# Patient Record
Sex: Male | Born: 1950 | Race: White | Hispanic: No | Marital: Married | Smoking: Former smoker
Health system: Southern US, Community
[De-identification: ages and names within clinical notes are randomized; demographics above are authoritative.]

## PROBLEM LIST (undated history)

## (undated) DIAGNOSIS — R918 Other nonspecific abnormal finding of lung field: Secondary | ICD-10-CM

## (undated) DIAGNOSIS — I10 Essential (primary) hypertension: Secondary | ICD-10-CM

## (undated) DIAGNOSIS — C3491 Malignant neoplasm of unspecified part of right bronchus or lung: Secondary | ICD-10-CM

## (undated) DIAGNOSIS — I255 Ischemic cardiomyopathy: Secondary | ICD-10-CM

## (undated) DIAGNOSIS — I2510 Atherosclerotic heart disease of native coronary artery without angina pectoris: Secondary | ICD-10-CM

## (undated) DIAGNOSIS — K219 Gastro-esophageal reflux disease without esophagitis: Secondary | ICD-10-CM

## (undated) DIAGNOSIS — Z951 Presence of aortocoronary bypass graft: Secondary | ICD-10-CM

## (undated) DIAGNOSIS — Z87891 Personal history of nicotine dependence: Secondary | ICD-10-CM

## (undated) DIAGNOSIS — E785 Hyperlipidemia, unspecified: Secondary | ICD-10-CM

## (undated) HISTORY — DX: Hyperlipidemia, unspecified: E78.5

## (undated) HISTORY — DX: Personal history of nicotine dependence: Z87.891

## (undated) HISTORY — DX: Ischemic cardiomyopathy: I25.5

## (undated) HISTORY — DX: Malignant neoplasm of unspecified part of right bronchus or lung: C34.91

## (undated) HISTORY — DX: Presence of aortocoronary bypass graft: Z95.1

## (undated) HISTORY — DX: Atherosclerotic heart disease of native coronary artery without angina pectoris: I25.10

---
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
## 2011-02-28 ENCOUNTER — Other Ambulatory Visit: Payer: Self-pay | Admitting: Internal Medicine

## 2011-02-28 DIAGNOSIS — F17200 Nicotine dependence, unspecified, uncomplicated: Secondary | ICD-10-CM

## 2011-03-06 ENCOUNTER — Ambulatory Visit
Admission: RE | Admit: 2011-03-06 | Discharge: 2011-03-06 | Disposition: A | Discharge status: Home / Self Care | Payer: No Typology Code available for payment source | Source: Ambulatory Visit | Attending: Internal Medicine | Admitting: Internal Medicine

## 2011-03-06 DIAGNOSIS — F17200 Nicotine dependence, unspecified, uncomplicated: Secondary | ICD-10-CM

## 2011-03-14 ENCOUNTER — Other Ambulatory Visit: Payer: Self-pay | Admitting: Internal Medicine

## 2011-03-14 DIAGNOSIS — R911 Solitary pulmonary nodule: Secondary | ICD-10-CM

## 2011-07-10 ENCOUNTER — Ambulatory Visit
Admission: RE | Admit: 2011-07-10 | Discharge: 2011-07-10 | Disposition: A | Discharge status: Home / Self Care | Payer: BC Managed Care – PPO | Source: Ambulatory Visit | Attending: Internal Medicine | Admitting: Internal Medicine

## 2011-07-10 DIAGNOSIS — R911 Solitary pulmonary nodule: Secondary | ICD-10-CM

## 2012-04-03 ENCOUNTER — Other Ambulatory Visit: Payer: Self-pay | Admitting: Internal Medicine

## 2012-04-07 ENCOUNTER — Ambulatory Visit
Admission: RE | Admit: 2012-04-07 | Discharge: 2012-04-07 | Disposition: A | Discharge status: Home / Self Care | Payer: BC Managed Care – PPO | Source: Ambulatory Visit | Attending: Internal Medicine | Admitting: Internal Medicine

## 2012-04-07 DIAGNOSIS — H0520 Unspecified exophthalmos: Secondary | ICD-10-CM

## 2012-04-07 MED ORDER — GADOBENATE DIMEGLUMINE 529 MG/ML IV SOLN
14.0000 mL | Freq: Once | INTRAVENOUS | Status: AC | PRN
  Administered 2012-04-07: 14 mL via INTRAVENOUS

## 2013-04-08 ENCOUNTER — Other Ambulatory Visit: Payer: Self-pay | Admitting: Internal Medicine

## 2013-04-08 DIAGNOSIS — E041 Nontoxic single thyroid nodule: Secondary | ICD-10-CM

## 2013-04-08 DIAGNOSIS — R911 Solitary pulmonary nodule: Secondary | ICD-10-CM

## 2013-04-15 ENCOUNTER — Ambulatory Visit
Admission: RE | Admit: 2013-04-15 | Discharge: 2013-04-15 | Disposition: A | Discharge status: Home / Self Care | Payer: BC Managed Care – PPO | Source: Ambulatory Visit | Attending: Internal Medicine | Admitting: Internal Medicine

## 2013-04-15 DIAGNOSIS — R911 Solitary pulmonary nodule: Secondary | ICD-10-CM

## 2013-04-15 DIAGNOSIS — E041 Nontoxic single thyroid nodule: Secondary | ICD-10-CM

## 2013-04-15 MED ORDER — IOHEXOL 300 MG/ML  SOLN
75.0000 mL | Freq: Once | INTRAMUSCULAR | Status: AC | PRN
  Administered 2013-04-15: 75 mL via INTRAVENOUS

## 2014-05-26 ENCOUNTER — Other Ambulatory Visit (HOSPITAL_COMMUNITY): Payer: Self-pay | Admitting: Internal Medicine

## 2014-05-26 DIAGNOSIS — R079 Chest pain, unspecified: Secondary | ICD-10-CM

## 2014-05-27 ENCOUNTER — Telehealth (HOSPITAL_COMMUNITY): Payer: Self-pay

## 2014-05-27 NOTE — Telephone Encounter (Signed)
Encounter complete. 

## 2014-05-28 ENCOUNTER — Ambulatory Visit (HOSPITAL_COMMUNITY)
Admission: RE | Admit: 2014-05-28 | Discharge: 2014-05-28 | Disposition: A | Discharge status: Home / Self Care | Payer: BLUE CROSS/BLUE SHIELD | Source: Ambulatory Visit | Attending: Cardiology | Admitting: Cardiology

## 2014-05-28 DIAGNOSIS — R0600 Dyspnea, unspecified: Secondary | ICD-10-CM | POA: Insufficient documentation

## 2014-05-28 DIAGNOSIS — R079 Chest pain, unspecified: Secondary | ICD-10-CM | POA: Insufficient documentation

## 2014-05-28 DIAGNOSIS — F17210 Nicotine dependence, cigarettes, uncomplicated: Secondary | ICD-10-CM | POA: Diagnosis not present

## 2014-05-28 DIAGNOSIS — R42 Dizziness and giddiness: Secondary | ICD-10-CM | POA: Diagnosis not present

## 2014-05-28 DIAGNOSIS — E785 Hyperlipidemia, unspecified: Secondary | ICD-10-CM | POA: Diagnosis not present

## 2014-05-28 HISTORY — PX: NM MYOVIEW LTD: HXRAD82

## 2014-05-28 MED ORDER — TECHNETIUM TC 99M SESTAMIBI GENERIC - CARDIOLITE
32.5000 | Freq: Once | INTRAVENOUS | Status: AC | PRN
  Administered 2014-05-28: 33 via INTRAVENOUS

## 2014-05-28 MED ORDER — REGADENOSON 0.4 MG/5ML IV SOLN
0.4000 mg | Freq: Once | INTRAVENOUS | Status: AC
  Administered 2014-05-28: 0.4 mg via INTRAVENOUS

## 2014-05-28 MED ORDER — TECHNETIUM TC 99M SESTAMIBI GENERIC - CARDIOLITE
10.6000 | Freq: Once | INTRAVENOUS | Status: AC | PRN
  Administered 2014-05-28: 11 via INTRAVENOUS

## 2014-05-28 NOTE — Procedures (Addendum)
[facility] [address] [place] [address] [phone]  Cardiology Nuclear Med Study  Kelly Olson. is a 64 y.o. male     MRN : [id number]     DOB: Jun 10, 1950  Procedure Date: 05/28/2014  Nuclear Med Background Indication for Stress Test:  Evaluation for Ischemia History:  No prior respiratory history reported;No prior cardiac history reported:No prior NUC MPI fo rcomparison. Cardiac Risk Factors: Lipids and Smoker  Symptoms:  Chest Pain, DOE, Light-Headedness and CP radiating to forearms and fingers.   Nuclear Pre-Procedure Caffeine/Decaff Intake:  1:00am NPO After: 9:00am   IV Site: R Forearm  IV 0.9% NS with Angio Cath:  22g  Chest Size (in):  36" IV Started by: Larene Beach, RN  Height: '5\' 11"'$  (1.803 m)  Cup Size: n/a  BMI:  Body mass index is 22.46 kg/(m^2). Weight:  161 lb (73.029 kg)   Tech Comments:  n/a    Nuclear Med Study 1 or 2 day study: 1 day  Stress Test Type:  [place]  Order Authorizing Provider:  [name], MD   Resting Radionuclide: Technetium 54mSestamibi  Resting Radionuclide Dose: 10.6 mCi   Stress Radionuclide:  Technetium 979mestamibi  Stress Radionuclide Dose: 32.5 mCi           Stress Protocol Rest HR: 74 Stress HR: 100  Rest BP: 158/98 Stress BP: 160/95  Exercise Time (min): n/a METS: n/a          Dose of Adenosine (mg):  n/a Dose of Lexiscan: 0.4 mg  Dose of Atropine (mg): n/a Dose of Dobutamine: n/a mcg/kg/min (at max HR)  Stress Test Technologist: GwMellody MemosCCT Nuclear Technologist:Pamela Phillips,CNMT   Rest Procedure:  Myocardial perfusion imaging was performed at rest 45 minutes following the intravenous administration of Technetium 9970mstamibi. Stress Procedure:  The patient received IV Lexiscan 0.4 mg over 15-seconds.  Technetium 71m24mtamibi injected IV at 30-seconds.  There were no significant changes with Lexiscan.  Quantitative spect images were  obtained after a 45 minute delay.  Transient Ischemic Dilatation (Normal <1.22):  1.54  QGS EDV:  191 ml QGS ESV:  127 ml LV Ejection Fraction: 33%  PHYSICIAN INTERPRETATION  Rest ECG: NSR with non-specific ST-T wave changes  Stress ECG: No significant change from baseline ECG and No significant ST segment change suggestive of ischemia.  QPS Raw Data Images:  Mild diaphragmatic attenuation; normal left ventricular size.  Increased tracer uptake in the splanchnic viscerae.Partially obscures the inferior wall on rest images (despite multiple attempts).  Increased RV free wall tracer uptake suggests elevated pulmonary pressures.   Stress Images:  There is decreased uptake in the anterior wall.  There is decreased uptake in the inferior wall.  There is decreased uptake in the apex.  There is partial reversibility in this area.  There is a large sized, moderate to severe intensity - partially reversible perfusion defect in the mid to apical anterior wall.  There is also a large size, moderate intensity also partially reversible perfusion defect in the entire inferior wall.  The apex has a fixed medium sized, severe intensity perfusion defect with no reversibility. Rest Images:  There is decreased uptake in the anterior wall. There is decreased uptake in the inferior wall. There is decreased uptake in the apex. Reversibility as noted. Subtraction (SDS):  There is a fixed defect that is most consistent with a previous infarction.  The defects are mostly fixed with partial  reversibility consistent with ischemia.  These is also apical akinesis and severe hypokinesis to akinesis of the mid to apical anterior and apical inferior wall with severely reduced LV function. Consistent with ischemic Cardiomyopathy.  Impression Exercise Capacity:  Lexiscan with no exercise. BP Response:  Normal blood pressure response. Clinical Symptoms:  Atypical chest pain. Hand tingling (which is one of his presenting  symptoms) ECG Impression:  No significant ECG changes with Lexiscan. Comparison with Prior Nuclear Study: No images to compare  Overall Impression:  High risk stress nuclear study Severely ischemic Cardiomyoapthy with evidence of at least 2 vessel disease & EF of ~33%.  Images are consistent with either infarction or severe resting ischemia in the LAD & RCA territory.  . Recommend Cardiac Catheterization.  LV Wall Motion:  Severely reduced LV function - EF 33% with anterior, apical and inferoapical wall motion abnormalitlies noted above.   ADDENDUM: This report was reviewed off-line upon completion & prior to the patient leaving. I personally discussed the concerning results with him.  He will be contacted tomorrow with a initial Cardiology Clinic appointment time to discuss scheduling Cardiac Catheterization.  Leonie Man, MD  05/28/2014 6:00 PM

## 2014-05-31 DIAGNOSIS — I255 Ischemic cardiomyopathy: Secondary | ICD-10-CM

## 2014-05-31 HISTORY — DX: Ischemic cardiomyopathy: I25.5

## 2014-06-01 ENCOUNTER — Encounter (HOSPITAL_COMMUNITY): Payer: Self-pay

## 2014-06-01 ENCOUNTER — Encounter: Payer: Self-pay | Admitting: *Deleted

## 2014-06-01 ENCOUNTER — Ambulatory Visit (INDEPENDENT_AMBULATORY_CARE_PROVIDER_SITE_OTHER): Payer: BLUE CROSS/BLUE SHIELD | Admitting: Physician Assistant

## 2014-06-01 ENCOUNTER — Encounter: Payer: Self-pay | Admitting: Physician Assistant

## 2014-06-01 ENCOUNTER — Encounter: Payer: Self-pay | Admitting: Cardiology

## 2014-06-01 VITALS — BP 142/84 | HR 94 | Ht 71.5 in | Wt 159.6 lb

## 2014-06-01 DIAGNOSIS — R0789 Other chest pain: Secondary | ICD-10-CM | POA: Diagnosis not present

## 2014-06-01 DIAGNOSIS — E785 Hyperlipidemia, unspecified: Secondary | ICD-10-CM | POA: Insufficient documentation

## 2014-06-01 DIAGNOSIS — R5383 Other fatigue: Secondary | ICD-10-CM

## 2014-06-01 DIAGNOSIS — Z01812 Encounter for preprocedural laboratory examination: Secondary | ICD-10-CM | POA: Diagnosis not present

## 2014-06-01 DIAGNOSIS — Z79899 Other long term (current) drug therapy: Secondary | ICD-10-CM

## 2014-06-01 DIAGNOSIS — Z720 Tobacco use: Secondary | ICD-10-CM

## 2014-06-01 DIAGNOSIS — R931 Abnormal findings on diagnostic imaging of heart and coronary circulation: Secondary | ICD-10-CM | POA: Diagnosis not present

## 2014-06-01 DIAGNOSIS — Z87891 Personal history of nicotine dependence: Secondary | ICD-10-CM | POA: Insufficient documentation

## 2014-06-01 DIAGNOSIS — I208 Other forms of angina pectoris: Secondary | ICD-10-CM

## 2014-06-01 DIAGNOSIS — Z8679 Personal history of other diseases of the circulatory system: Secondary | ICD-10-CM | POA: Insufficient documentation

## 2014-06-01 DIAGNOSIS — I209 Angina pectoris, unspecified: Secondary | ICD-10-CM | POA: Insufficient documentation

## 2014-06-01 MED ORDER — CARVEDILOL 3.125 MG PO TABS: 3.1250 mg | ORAL_TABLET | Freq: Two times a day (BID) | ORAL | Status: DC

## 2014-06-01 NOTE — Progress Notes (Signed)
Patient ID: Kelly Spiller., male   DOB: 28-Apr-1950, 64 y.o.   MRN: [id number]    Date:  06/01/2014   ID:  Kelly Harpin., DOB 05/20/1950, MRN [id number]  PCP:  Irven Shelling, MD  Primary Cardiologist: [facility]  Chief Complaint  Patient presents with  . Follow-up    Dr. Ellyn Hack discuss Nuclear Study with patient Thursday after his test and requested he be seen today to set up for a cath. Stress test due to aching in forarms, tingling of fingers and tightness in center of chest with exercise.  Symptoms resolve quickly when stopping exercise.     History of Present Illness: Kelly Garry. is a 64 y.o. male with a history of tobacco abuse up until Friday last week.  ~46PY, HDL. He has recent been walking 7.5 miles a day because his wife got him a Fitbit.  When his HR is 118-120 he develops mild chest tightness and forearm pain.  It resolves with cessation of activity.  He had a colonoscopy within the last 1-2 years which was ok.   He under went a lexiscan stress trest last week which was high risk. Severely ischemic Cardiomyoapthy with evidence of at least 2 vessel disease & EF of ~33%. Images are consistent with either infarction or severe resting ischemia in the LAD & RCA territory.   The patient currently denies nausea, vomiting, fever, shortness of breath, orthopnea, dizziness, PND, cough, congestion, abdominal pain, hematochezia, melena, lower extremity edema, claudication.  Wt Readings from Last 3 Encounters:  06/01/14 159 lb 9.6 oz (72.394 kg)  05/28/14 161 lb (73.029 kg)     History reviewed. No pertinent past medical history.  Current Outpatient Prescriptions  Medication Sig Dispense Refill  . aspirin 81 MG tablet Take 81 mg by mouth daily.    Marland Kitchen atorvastatin (LIPITOR) 10 MG tablet Take 1 tablet by mouth daily.    . carvedilol (COREG) 3.125 MG tablet Take 1 tablet (3.125 mg total) by mouth 2 (two) times daily. 60 tablet 5   No current facility-administered  medications for this visit.    Allergies:   No Known Allergies  Social History:  The patient  reports that he quit smoking 3 days ago. He does not have any smokeless tobacco history on file. He reports that he drinks about 4.2 - 8.4 oz of alcohol per week. He reports that he does not use illicit drugs.   Family history:  History reviewed. No pertinent family history.  ROS:  Please see the history of present illness.  All other systems reviewed and negative.   PHYSICAL EXAM: VS:  BP 142/84 mmHg  Pulse 94  Ht 5' 11.5" (1.816 m)  Wt 159 lb 9.6 oz (72.394 kg)  BMI 21.95 kg/m2 Well nourished, well developed, in no acute distress HEENT: Pupils are equal round react to light accommodation extraocular movements are intact.  Neck: no JVDNo cervical lymphadenopathy. Cardiac: Regular rate and rhythm without murmurs rubs or gallops. Lungs:  clear to auscultation bilaterally, no wheezing, rhonchi or rales Abd: soft, nontender, positive bowel sounds all quadrants, no hepatosplenomegaly Ext: no lower extremity edema.  2+ radial and dorsalis pedis pulses. Skin: warm and dry Neuro:  Grossly normal      ASSESSMENT AND PLAN:  Problem List Items Addressed This Visit    Tobacco abuse    Quit last Friday      Stable angina    SP high risk nuke.  Left heart cath with Dr. Ellyn Hack  this week.  Precath labs and CXR,  Start Coreg 3.125 bid.  On ASA and statin.   Risks discussed.  No exercise for now.       Relevant Medications   atorvastatin (LIPITOR) 10 MG tablet   aspirin 81 MG tablet   carvedilol (COREG) 3.125 MG tablet   HLD (hyperlipidemia)    On a statin      Relevant Medications   atorvastatin (LIPITOR) 10 MG tablet   aspirin 81 MG tablet   carvedilol (COREG) 3.125 MG tablet    Other Visit Diagnoses    Abnormal nuclear cardiac imaging test    -  Primary    Relevant Orders    LEFT HEART CATHETERIZATION WITH CORONARY ANGIOGRAM    Chest discomfort        Relevant Orders    LEFT  HEART CATHETERIZATION WITH CORONARY ANGIOGRAM    Pre-procedure lab exam        Relevant Orders    APTT    Protime-INR    Other fatigue        Relevant Orders    CBC    Medication management        Relevant Orders    Comprehensive metabolic panel        Procedure:  Left heart CAth and possible PCI  The procedure with Risks/Benefits/Alternatives and Indications was reviewed with the patient.  All questions were answered.    Risks / Complications include, but not limited to: Death, MI, CVA/TIA, VF/VT (with defibrillation), Bradycardia (need for temporary pacer placement), contrast induced nephropathy, bleeding / bruising / hematoma / pseudoaneurysm, vascular or coronary injury (with possible emergent CT or Vascular Surgery), adverse medication reactions, infection.     Kelly Saccente PA-C 06/01/2014, 9:10 AM

## 2014-06-01 NOTE — Assessment & Plan Note (Signed)
SP high risk nuke.  Left heart cath with Dr. Ellyn Hack this week.  Precath labs and CXR,  Start Coreg 3.125 bid.  On ASA and statin.   Risks discussed.  No exercise for now.

## 2014-06-01 NOTE — Assessment & Plan Note (Signed)
Quit last Friday

## 2014-06-01 NOTE — Patient Instructions (Addendum)
Your physician has requested that you have a Left Heart cardiac catheterization by Dr. Glenetta Hew this week. Cardiac catheterization is used to diagnose and/or treat various heart conditions. Doctors may recommend this procedure for a number of different reasons. The most common reason is to evaluate chest pain. Chest pain can be a symptom of coronary artery disease (CAD), and cardiac catheterization can show whether plaque is narrowing or blocking your heart's arteries. This procedure is also used to evaluate the valves, as well as measure the blood flow and oxygen levels in different parts of your heart. For further information please visit [email or web address]. Please follow instruction sheet, as given  Your physician recommends that you return for lab work in: Today at [facility] on the first floor.   START Carvedilol 3.'125mg'$  twice daily.  This prescription has been sent to your pharmacy.

## 2014-06-01 NOTE — Assessment & Plan Note (Signed)
On a statin 

## 2014-06-02 ENCOUNTER — Encounter (HOSPITAL_COMMUNITY)
Admission: RE | Disposition: A | Discharge status: Home / Self Care | Payer: BLUE CROSS/BLUE SHIELD | Source: Ambulatory Visit | Attending: Pediatric Cardiology

## 2014-06-02 ENCOUNTER — Encounter (HOSPITAL_COMMUNITY): Payer: Self-pay | Admitting: General Practice

## 2014-06-02 ENCOUNTER — Other Ambulatory Visit: Payer: Self-pay | Admitting: *Deleted

## 2014-06-02 ENCOUNTER — Inpatient Hospital Stay (HOSPITAL_COMMUNITY)
Admission: RE | Admit: 2014-06-02 | Discharge: 2014-06-08 | DRG: 234 | Disposition: A | Discharge status: Home / Self Care | Payer: BLUE CROSS/BLUE SHIELD | Source: Ambulatory Visit | Attending: Pediatric Cardiology | Admitting: Pediatric Cardiology

## 2014-06-02 DIAGNOSIS — Z8249 Family history of ischemic heart disease and other diseases of the circulatory system: Secondary | ICD-10-CM | POA: Diagnosis not present

## 2014-06-02 DIAGNOSIS — I209 Angina pectoris, unspecified: Secondary | ICD-10-CM | POA: Diagnosis present

## 2014-06-02 DIAGNOSIS — I25118 Atherosclerotic heart disease of native coronary artery with other forms of angina pectoris: Secondary | ICD-10-CM | POA: Diagnosis not present

## 2014-06-02 DIAGNOSIS — R931 Abnormal findings on diagnostic imaging of heart and coronary circulation: Secondary | ICD-10-CM | POA: Diagnosis not present

## 2014-06-02 DIAGNOSIS — J9811 Atelectasis: Secondary | ICD-10-CM | POA: Diagnosis not present

## 2014-06-02 DIAGNOSIS — I25110 Atherosclerotic heart disease of native coronary artery with unstable angina pectoris: Secondary | ICD-10-CM | POA: Diagnosis not present

## 2014-06-02 DIAGNOSIS — Z7982 Long term (current) use of aspirin: Secondary | ICD-10-CM

## 2014-06-02 DIAGNOSIS — I25119 Atherosclerotic heart disease of native coronary artery with unspecified angina pectoris: Secondary | ICD-10-CM | POA: Diagnosis present

## 2014-06-02 DIAGNOSIS — F17210 Nicotine dependence, cigarettes, uncomplicated: Secondary | ICD-10-CM | POA: Diagnosis present

## 2014-06-02 DIAGNOSIS — E785 Hyperlipidemia, unspecified: Secondary | ICD-10-CM | POA: Diagnosis present

## 2014-06-02 DIAGNOSIS — D62 Acute posthemorrhagic anemia: Secondary | ICD-10-CM | POA: Diagnosis not present

## 2014-06-02 DIAGNOSIS — I208 Other forms of angina pectoris: Secondary | ICD-10-CM | POA: Diagnosis not present

## 2014-06-02 DIAGNOSIS — Z951 Presence of aortocoronary bypass graft: Secondary | ICD-10-CM

## 2014-06-02 DIAGNOSIS — I2510 Atherosclerotic heart disease of native coronary artery without angina pectoris: Secondary | ICD-10-CM | POA: Diagnosis present

## 2014-06-02 DIAGNOSIS — Z8679 Personal history of other diseases of the circulatory system: Secondary | ICD-10-CM | POA: Diagnosis present

## 2014-06-02 DIAGNOSIS — Z720 Tobacco use: Secondary | ICD-10-CM

## 2014-06-02 DIAGNOSIS — E8770 Fluid overload, unspecified: Secondary | ICD-10-CM | POA: Diagnosis not present

## 2014-06-02 DIAGNOSIS — R9439 Abnormal result of other cardiovascular function study: Secondary | ICD-10-CM | POA: Diagnosis present

## 2014-06-02 DIAGNOSIS — J939 Pneumothorax, unspecified: Secondary | ICD-10-CM

## 2014-06-02 DIAGNOSIS — Z87891 Personal history of nicotine dependence: Secondary | ICD-10-CM | POA: Diagnosis present

## 2014-06-02 HISTORY — PX: CARDIAC CATHETERIZATION: SHX172

## 2014-06-02 HISTORY — DX: Atherosclerotic heart disease of native coronary artery without angina pectoris: I25.10

## 2014-06-02 LAB — COMPREHENSIVE METABOLIC PANEL
ALBUMIN: 4.5 g/dL (ref 3.5–5.2)
ALT: 19 U/L (ref 0–53)
AST: 27 U/L (ref 0–37)
Alkaline Phosphatase: 80 U/L (ref 39–117)
BUN: 19 mg/dL (ref 6–23)
CALCIUM: 9.6 mg/dL (ref 8.4–10.5)
CHLORIDE: 101 meq/L (ref 96–112)
CO2: 24 meq/L (ref 19–32)
Creat: 0.81 mg/dL (ref 0.50–1.35)
GLUCOSE: 102 mg/dL — AB (ref 70–99)
Potassium: 4.5 mEq/L (ref 3.5–5.3)
SODIUM: 141 meq/L (ref 135–145)
TOTAL PROTEIN: 7.5 g/dL (ref 6.0–8.3)
Total Bilirubin: 0.4 mg/dL (ref 0.2–1.2)

## 2014-06-02 LAB — PROTIME-INR
INR: 0.97 (ref <1.50)
INR: 0.99 (ref 0.00–1.49)
Prothrombin Time: 12.9 seconds (ref 11.6–15.2)
Prothrombin Time: 13.2 seconds (ref 11.6–15.2)

## 2014-06-02 LAB — CBC
HCT: 52.4 % — ABNORMAL HIGH (ref 39.0–52.0)
HEMATOCRIT: 52.1 % — AB (ref 39.0–52.0)
HEMOGLOBIN: 17.6 g/dL — AB (ref 13.0–17.0)
Hemoglobin: 17.9 g/dL — ABNORMAL HIGH (ref 13.0–17.0)
MCH: 31.5 pg (ref 26.0–34.0)
MCH: 32.1 pg (ref 26.0–34.0)
MCHC: 33.8 g/dL (ref 30.0–36.0)
MCHC: 34.2 g/dL (ref 30.0–36.0)
MCV: 93.4 fL (ref 78.0–100.0)
MCV: 94.1 fL (ref 78.0–100.0)
MPV: 9.2 fL (ref 8.6–12.4)
PLATELETS: 322 10*3/uL (ref 150–400)
Platelets: 331 10*3/uL (ref 150–400)
RBC: 5.58 MIL/uL (ref 4.22–5.81)
RBC: 5.57 MIL/uL (ref 4.22–5.81)
RDW: 13.5 % (ref 11.5–15.5)
RDW: 13.4 % (ref 11.5–15.5)
WBC: 8.6 10*3/uL (ref 4.0–10.5)
WBC: 8.6 10*3/uL (ref 4.0–10.5)

## 2014-06-02 LAB — BASIC METABOLIC PANEL
Anion gap: 13 (ref 5–15)
BUN: 21 mg/dL — AB (ref 6–20)
CHLORIDE: 103 mmol/L (ref 101–111)
CO2: 23 mmol/L (ref 22–32)
Calcium: 9.2 mg/dL (ref 8.9–10.3)
Creatinine, Ser: 0.85 mg/dL (ref 0.61–1.24)
GFR calc Af Amer: >60 mL/min (ref >60)
GFR calc non Af Amer: >60 mL/min (ref >60)
GLUCOSE: 108 mg/dL — AB (ref 70–99)
POTASSIUM: 4.3 mmol/L (ref 3.5–5.1)
SODIUM: 139 mmol/L (ref 135–145)

## 2014-06-02 LAB — APTT: aPTT: 33 seconds (ref 24–37)

## 2014-06-02 SURGERY — LEFT HEART CATH AND CORONARY ANGIOGRAPHY
Anesthesia: LOCAL

## 2014-06-02 MED ORDER — LOSARTAN POTASSIUM 25 MG PO TABS
25.0000 mg | ORAL_TABLET | Freq: Every day | ORAL | Status: DC
Start: 2014-06-02 — End: 2014-06-08

## 2014-06-02 MED ORDER — ASPIRIN 81 MG PO CHEW: 81.0000 mg | CHEWABLE_TABLET | ORAL | Status: DC

## 2014-06-02 MED ORDER — VERAPAMIL HCL 2.5 MG/ML IV SOLN
INTRAVENOUS | Status: AC
  Filled 2014-06-02: qty 2

## 2014-06-02 MED ORDER — CARVEDILOL 3.125 MG PO TABS: 6.5000 mg | ORAL_TABLET | Freq: Two times a day (BID) | ORAL | Status: DC

## 2014-06-02 MED ORDER — FENTANYL CITRATE (PF) 100 MCG/2ML IJ SOLN
INTRAMUSCULAR | Status: DC | PRN
  Administered 2014-06-02: 50 ug via INTRAVENOUS

## 2014-06-02 MED ORDER — SODIUM CHLORIDE 0.9 % IV SOLN
3.0000 mL/kg/h | INTRAVENOUS | Status: DC
Start: 2014-06-02 — End: 2014-06-02
  Administered 2014-06-02: 3 mL/kg/h via INTRAVENOUS

## 2014-06-02 MED ORDER — HEPARIN SODIUM (PORCINE) 1000 UNIT/ML IJ SOLN
INTRAMUSCULAR | Status: DC | PRN
  Administered 2014-06-02: 3500 [IU] via INTRAVENOUS

## 2014-06-02 MED ORDER — CARVEDILOL 3.125 MG PO TABS
6.2500 mg | ORAL_TABLET | Freq: Two times a day (BID) | ORAL | Status: DC
  Filled 2014-06-02 (×3): qty 2

## 2014-06-02 MED ORDER — NITROGLYCERIN 1 MG/10 ML FOR IR/CATH LAB
INTRA_ARTERIAL | Status: AC
  Filled 2014-06-02: qty 10

## 2014-06-02 MED ORDER — LIDOCAINE HCL (PF) 1 % IJ SOLN
INTRAMUSCULAR | Status: AC
  Filled 2014-06-02: qty 30

## 2014-06-02 MED ORDER — SODIUM CHLORIDE 0.9 % IJ SOLN: 3.0000 mL | Freq: Two times a day (BID) | INTRAMUSCULAR | Status: DC

## 2014-06-02 MED ORDER — VERAPAMIL HCL 2.5 MG/ML IV SOLN
INTRAVENOUS | Status: DC | PRN
  Administered 2014-06-02: 08:00:00 via INTRA_ARTERIAL

## 2014-06-02 MED ORDER — ONDANSETRON HCL 4 MG/2ML IJ SOLN: 4.0000 mg | Freq: Four times a day (QID) | INTRAMUSCULAR | Status: DC | PRN

## 2014-06-02 MED ORDER — MIDAZOLAM HCL 2 MG/2ML IJ SOLN
INTRAMUSCULAR | Status: DC | PRN
  Administered 2014-06-02: 2 mg via INTRAVENOUS

## 2014-06-02 MED ORDER — ATORVASTATIN CALCIUM 80 MG PO TABS
80.0000 mg | ORAL_TABLET | Freq: Every day | ORAL | Status: DC
  Administered 2014-06-03 – 2014-06-05 (×2): 80 mg via ORAL
  Filled 2014-06-02 (×5): qty 1

## 2014-06-02 MED ORDER — SODIUM CHLORIDE 0.9 % IV SOLN: 250.0000 mL | INTRAVENOUS | Status: DC | PRN

## 2014-06-02 MED ORDER — SODIUM CHLORIDE 0.9 % WEIGHT BASED INFUSION
3.0000 mL/kg/h | INTRAVENOUS | Status: AC
  Administered 2014-06-02: 3 mL/kg/h via INTRAVENOUS

## 2014-06-02 MED ORDER — HEPARIN (PORCINE) IN NACL 2-0.9 UNIT/ML-% IJ SOLN
INTRAMUSCULAR | Status: AC
  Filled 2014-06-02: qty 1000

## 2014-06-02 MED ORDER — SODIUM CHLORIDE 0.9 % IV SOLN
1.0000 mL/kg/h | INTRAVENOUS | Status: DC
Start: 2014-06-02 — End: 2014-06-02

## 2014-06-02 MED ORDER — SODIUM CHLORIDE 0.9 % IJ SOLN: 3.0000 mL | INTRAMUSCULAR | Status: DC | PRN

## 2014-06-02 MED ORDER — SODIUM CHLORIDE 0.9 % IJ SOLN
3.0000 mL | Freq: Two times a day (BID) | INTRAMUSCULAR | Status: DC
  Administered 2014-06-02 – 2014-06-03 (×3): 3 mL via INTRAVENOUS

## 2014-06-02 MED ORDER — LOSARTAN POTASSIUM 25 MG PO TABS
25.0000 mg | ORAL_TABLET | Freq: Every day | ORAL | Status: DC
  Filled 2014-06-02 (×3): qty 1

## 2014-06-02 MED ORDER — MIDAZOLAM HCL 2 MG/2ML IJ SOLN
INTRAMUSCULAR | Status: AC
  Filled 2014-06-02: qty 2

## 2014-06-02 MED ORDER — IOHEXOL 350 MG/ML SOLN
INTRAVENOUS | Status: DC | PRN
  Administered 2014-06-02: 100 mL via INTRA_ARTERIAL

## 2014-06-02 MED ORDER — HEPARIN SODIUM (PORCINE) 1000 UNIT/ML IJ SOLN
INTRAMUSCULAR | Status: AC
  Filled 2014-06-02: qty 1

## 2014-06-02 MED ORDER — CARVEDILOL 3.125 MG PO TABS
3.1250 mg | ORAL_TABLET | Freq: Two times a day (BID) | ORAL | Status: DC
  Administered 2014-06-02: 3.125 mg via ORAL
  Filled 2014-06-02: qty 1

## 2014-06-02 MED ORDER — ACETAMINOPHEN 325 MG PO TABS: 650.0000 mg | ORAL_TABLET | ORAL | Status: DC | PRN

## 2014-06-02 MED ORDER — FENTANYL CITRATE (PF) 100 MCG/2ML IJ SOLN
INTRAMUSCULAR | Status: AC
  Filled 2014-06-02: qty 2

## 2014-06-02 MED ORDER — MORPHINE SULFATE 2 MG/ML IJ SOLN: 2.0000 mg | INTRAMUSCULAR | Status: DC | PRN

## 2014-06-02 SURGICAL SUPPLY — 11 items
CATH INFINITI 5FR ANG PIGTAIL (CATHETERS) ×2 IMPLANT
CATH INFINITI 5FR MULTPACK ANG (CATHETERS) IMPLANT
CATH OPTITORQUE TIG 4.0 5F (CATHETERS) ×2 IMPLANT
DEVICE RAD COMP TR BAND LRG (VASCULAR PRODUCTS) ×2 IMPLANT
GLIDESHEATH SLEND A-KIT 6F 22G (SHEATH) ×2 IMPLANT
KIT HEART LEFT (KITS) ×2 IMPLANT
PACK CARDIAC CATHETERIZATION (CUSTOM PROCEDURE TRAY) ×2 IMPLANT
SYR MEDRAD MARK V 150ML (SYRINGE) ×2 IMPLANT
TRANSDUCER W/STOPCOCK (MISCELLANEOUS) ×2 IMPLANT
TUBING CIL FLEX 10 FLL-RA (TUBING) ×2 IMPLANT
WIRE SAFE-T 1.5MM-J .035X260CM (WIRE) ×2 IMPLANT

## 2014-06-02 NOTE — Progress Notes (Signed)
Report called to RN for 2011

## 2014-06-02 NOTE — H&P (View-Only) (Signed)
Patient ID: Kelly Olson., male   DOB: Dec 05, 1950, 64 y.o.   MRN: [id number]    Date:  06/01/2014   ID:  Kelly Olson., DOB Sep 28, 1950, MRN [id number]  PCP:  Irven Shelling, MD  Primary Cardiologist: [place]  Chief Complaint  Patient presents with  . Follow-up    Dr. Ellyn Olson discuss Nuclear Study with patient Thursday after his test and requested he be seen today to set up for a cath. Stress test due to aching in forarms, tingling of fingers and tightness in center of chest with exercise.  Symptoms resolve quickly when stopping exercise.     History of Present Illness: Kelly Olson. is a 64 y.o. male with a history of tobacco abuse up until Friday last week.  ~46PY, HDL. He has recent been walking 7.5 miles a day because his wife got him a Fitbit.  When his HR is 118-120 he develops mild chest tightness and forearm pain.  It resolves with cessation of activity.  He had a colonoscopy within the last 1-2 years which was ok.   He under went a lexiscan stress trest last week which was high risk. Severely ischemic Cardiomyoapthy with evidence of at least 2 vessel disease & EF of ~33%. Images are consistent with either infarction or severe resting ischemia in the LAD & RCA territory.   The patient currently denies nausea, vomiting, fever, shortness of breath, orthopnea, dizziness, PND, cough, congestion, abdominal pain, hematochezia, melena, lower extremity edema, claudication.  Wt Readings from Last 3 Encounters:  06/01/14 159 lb 9.6 oz (72.394 kg)  05/28/14 161 lb (73.029 kg)     History reviewed. No pertinent past medical history.  Current Outpatient Prescriptions  Medication Sig Dispense Refill  . aspirin 81 MG tablet Take 81 mg by mouth daily.    Marland Kitchen atorvastatin (LIPITOR) 10 MG tablet Take 1 tablet by mouth daily.    . carvedilol (COREG) 3.125 MG tablet Take 1 tablet (3.125 mg total) by mouth 2 (two) times daily. 60 tablet 5   No current facility-administered  medications for this visit.    Allergies:   No Known Allergies  Social History:  The patient  reports that he quit smoking 3 days ago. He does not have any smokeless tobacco history on file. He reports that he drinks about 4.2 - 8.4 oz of alcohol per week. He reports that he does not use illicit drugs.   Family history:  History reviewed. No pertinent family history.  ROS:  Please see the history of present illness.  All other systems reviewed and negative.   PHYSICAL EXAM: VS:  BP 142/84 mmHg  Pulse 94  Ht 5' 11.5" (1.816 m)  Wt 159 lb 9.6 oz (72.394 kg)  BMI 21.95 kg/m2 Well nourished, well developed, in no acute distress HEENT: Pupils are equal round react to light accommodation extraocular movements are intact.  Neck: no JVDNo cervical lymphadenopathy. Cardiac: Regular rate and rhythm without murmurs rubs or gallops. Lungs:  clear to auscultation bilaterally, no wheezing, rhonchi or rales Abd: soft, nontender, positive bowel sounds all quadrants, no hepatosplenomegaly Ext: no lower extremity edema.  2+ radial and dorsalis pedis pulses. Skin: warm and dry Neuro:  Grossly normal      ASSESSMENT AND PLAN:  Problem List Items Addressed This Visit    Tobacco abuse    Quit last Friday      Stable angina    SP high risk nuke.  Left heart cath with Dr. Ellyn Olson  this week.  Precath labs and CXR,  Start Coreg 3.125 bid.  On ASA and statin.   Risks discussed.  No exercise for now.       Relevant Medications   atorvastatin (LIPITOR) 10 MG tablet   aspirin 81 MG tablet   carvedilol (COREG) 3.125 MG tablet   HLD (hyperlipidemia)    On a statin      Relevant Medications   atorvastatin (LIPITOR) 10 MG tablet   aspirin 81 MG tablet   carvedilol (COREG) 3.125 MG tablet    Other Visit Diagnoses    Abnormal nuclear cardiac imaging test    -  Primary    Relevant Orders    LEFT HEART CATHETERIZATION WITH CORONARY ANGIOGRAM    Chest discomfort        Relevant Orders    LEFT  HEART CATHETERIZATION WITH CORONARY ANGIOGRAM    Pre-procedure lab exam        Relevant Orders    APTT    Protime-INR    Other fatigue        Relevant Orders    CBC    Medication management        Relevant Orders    Comprehensive metabolic panel        Procedure:  Left heart CAth and possible PCI  The procedure with Risks/Benefits/Alternatives and Indications was reviewed with the patient.  All questions were answered.    Risks / Complications include, but not limited to: Death, MI, CVA/TIA, VF/VT (with defibrillation), Bradycardia (need for temporary pacer placement), contrast induced nephropathy, bleeding / bruising / hematoma / pseudoaneurysm, vascular or coronary injury (with possible emergent CT or Vascular Surgery), adverse medication reactions, infection.     Kelly Esguerra PA-C 06/01/2014, 9:10 AM

## 2014-06-02 NOTE — Consult Note (Signed)
[address]       [place] [address]             [phone]      Cardiothoracic Surgery Consultation   Reason for Consult: Severe multi-vessel coronary artery disease Referring Physician:  Dr. Mauro Kaufmann. is an 64 y.o. male.  HPI:   The patient is a 64 year old gentleman with a family history of coronary artery disease, smoking until last week who started exercising about 6 weeks ago after his wife got him a Fitbit. He has been walking up to 7 miles per day but has noticed that when his heart rate got up to 120's he started having chest tightness and pain in his forearms with tingling in his fingers. This resolved with rest. He was seeing Dr. Laurann Montana for follow up after starting a statin and mentioned these symptoms as he was leaving and Dr. Laurann Montana got an ECG which showed some changes compared to his old ECG. He has a nuclear stress test that was high risk with an EF of 33% with anterior, apical and inferoapical wall motion abnormalities. Cardiac cath today shows severe 3 vessel disease with an occluded proximal LAD after a large septal with collaterals filling the LAD from the left and right, an occluded mid RCA with the PDA filling from left collaterals and a 90% complex lesion in the proximal LCX.  He lives with his wife and continues to work more than full-time at [employer] where he has worked for over 40 years.   Past medical history: hyperlipidemia  Past surgical history: tonsillectomy as a child   Family history: Father had dilated cardiomyopathy but does not know if he had coronary disease. Grandfather had MI, Uncle had MI.  Social History:  reports that he quit smoking 4 days ago. He does not have any smokeless tobacco history on file. He reports that he drinks about 4.2 - 8.4 oz of alcohol per week. He reports that he does not use illicit drugs.  Allergies: No Known Allergies  Medications:  I have reviewed the  patient's current medications. Prior to Admission:  Prescriptions prior to admission  Medication Sig Dispense Refill Last Dose  . aspirin 81 MG tablet Take 81 mg by mouth daily.   06/02/2014 at Unknown time  . atorvastatin (LIPITOR) 10 MG tablet Take 1 tablet by mouth daily.   06/02/2014 at Unknown time  . [DISCONTINUED] carvedilol (COREG) 3.125 MG tablet Take 1 tablet (3.125 mg total) by mouth 2 (two) times daily. 60 tablet 5 06/02/2014 at Unknown time   Scheduled: . atorvastatin  80 mg Oral q1800  . carvedilol  6.25 mg Oral BID  . losartan  25 mg Oral Daily  . sodium chloride  3 mL Intravenous Q12H   Continuous:  XVQ:MGQQPY chloride, acetaminophen, morphine injection, ondansetron (ZOFRAN) IV, sodium chloride Anti-infectives    None      Results for orders placed or performed during the hospital encounter of 06/02/14 (from the past 48 hour(s))  CBC     Status: Abnormal   Collection Time: 06/02/14  6:41 AM  Result Value Ref Range   WBC 8.6 4.0 - 10.5 K/uL   RBC 5.57 4.22 - 5.81 MIL/uL   Hemoglobin 17.9 (H) 13.0 - 17.0 g/dL   HCT 52.4 (H) 39.0 - 52.0 %   MCV 94.1 78.0 - 100.0 fL   MCH 32.1 26.0 - 34.0 pg   MCHC  34.2 30.0 - 36.0 g/dL   RDW 13.4 11.5 - 15.5 %   Platelets 322 150 - 400 K/uL  Basic metabolic panel     Status: Abnormal   Collection Time: 06/02/14  6:41 AM  Result Value Ref Range   Sodium 139 135 - 145 mmol/L   Potassium 4.3 3.5 - 5.1 mmol/L   Chloride 103 101 - 111 mmol/L   CO2 23 22 - 32 mmol/L   Glucose, Bld 108 (H) 70 - 99 mg/dL   BUN 21 (H) 6 - 20 mg/dL   Creatinine, Ser 0.85 0.61 - 1.24 mg/dL   Calcium 9.2 8.9 - 10.3 mg/dL   GFR calc non Af Amer >60 >60 mL/min   GFR calc Af Amer >60 >60 mL/min    Comment: (NOTE) The eGFR has been calculated using the CKD EPI equation. This calculation has not been validated in all clinical situations. eGFR's persistently <90 mL/min signify possible Chronic Kidney Disease.    Anion gap 13 5 - 15  Protime-INR     Status:  None   Collection Time: 06/02/14  6:41 AM  Result Value Ref Range   Prothrombin Time 13.2 11.6 - 15.2 seconds   INR 0.99 0.00 - 1.49    No results found.  Review of Systems  Constitutional: Negative for fever, chills, weight loss and malaise/fatigue.  HENT: Negative.   Eyes: Negative.   Respiratory: Negative for shortness of breath.   Cardiovascular: Positive for chest pain. Negative for palpitations, orthopnea, leg swelling and PND.  Gastrointestinal: Negative.   Genitourinary: Negative.   Musculoskeletal:       Exertional pain in forearms  Skin: Negative.   Neurological: Positive for tingling.       In fingers with exertion  Endo/Heme/Allergies: Negative.   Psychiatric/Behavioral: Negative.    Blood pressure 154/75, pulse 51, temperature 98 F (36.7 C), temperature source Oral, resp. rate 0, height $RemoveB'5\' 11"'UXliLGHz$  (1.803 m), weight 72.576 kg (160 lb), SpO2 94 %. Physical Exam  Constitutional: He is oriented to person, place, and time. He appears well-developed and well-nourished. No distress.  HENT:  Head: Normocephalic and atraumatic.  Mouth/Throat: Oropharynx is clear and moist.  Eyes: EOM are normal. Pupils are equal, round, and reactive to light.  Neck: Normal range of motion. Neck supple. No JVD present. No thyromegaly present.  Cardiovascular: Normal rate, regular rhythm, normal heart sounds and intact distal pulses.   No murmur heard. Respiratory: Effort normal and breath sounds normal. No respiratory distress. He has no rales.  GI: Soft. Bowel sounds are normal. He exhibits no distension and no mass. There is no tenderness.  Musculoskeletal: He exhibits no edema.  Lymphadenopathy:    He has no cervical adenopathy.  Neurological: He is alert and oriented to person, place, and time. He has normal strength. No cranial nerve deficit or sensory deficit.  Skin: Skin is warm and dry.  Psychiatric: He has a normal mood and affect.    Assessment/Plan:  He has severe,  symptomatic multi-vessel coronary artery disease with moderate LV dysfunction and a high risk nuclear stress test. I agree that CABG is the best treatment for him. I discussed the operative procedure with the patient including alternatives, benefits and risks; including but not limited to bleeding, blood transfusion, infection, stroke, myocardial infarction, graft failure, heart block requiring a permanent pacemaker, organ dysfunction, and death.  [name] understands and agrees to proceed.  We will schedule surgery for Thursday.  Gaye Pollack 06/02/2014, 5:26  PM      

## 2014-06-02 NOTE — Interval H&P Note (Signed)
History and Physical Interval Note:  06/02/2014 7:30 AM  Kelly Olson.  has presented today for surgery, with the diagnosis of CLASS II ANGINA - HIGH RISK NUCLEAR STRESS TEST.  The various methods of treatment have been discussed with the patient and family. After consideration of risks, benefits and other options for treatment, the patient has consented to  Procedure(s): Left Heart Cath and Coronary Angiography (N/A) w/ possible Percutaneous Coronary Intervention.  as a surgical intervention .  The patient's history has been reviewed, patient examined, no change in status, stable for surgery.  I have reviewed the patient's chart and labs.  Questions were answered to the patient's satisfaction.    Cath Lab Visit (complete for each Cath Lab visit)  Clinical Evaluation Leading to the Procedure:   ACS: No.  Non-ACS:    Anginal Classification: CCS II  Anti-ischemic medical therapy: Minimal Therapy (1 class of medications)  Non-Invasive Test Results: High-risk stress test findings: cardiac mortality >3%/year  Prior CABG: No previous CABG  HARDING, DAVID W

## 2014-06-03 ENCOUNTER — Inpatient Hospital Stay (HOSPITAL_COMMUNITY): Payer: BLUE CROSS/BLUE SHIELD

## 2014-06-03 ENCOUNTER — Encounter (HOSPITAL_COMMUNITY): Payer: Self-pay | Admitting: Cardiology

## 2014-06-03 DIAGNOSIS — I208 Other forms of angina pectoris: Secondary | ICD-10-CM

## 2014-06-03 DIAGNOSIS — R931 Abnormal findings on diagnostic imaging of heart and coronary circulation: Secondary | ICD-10-CM

## 2014-06-03 LAB — PULMONARY FUNCTION TEST
DL/VA % pred: 69 %
DL/VA: 3.2 ml/min/mmHg/L
DLCO unc % pred: 57 %
DLCO unc: 18.55 ml/min/mmHg
FEF 25-75 PRE: 0.86 L/s
FEF 25-75 Post: 1.26 L/sec
FEF2575-%CHANGE-POST: 45 %
FEF2575-%PRED-PRE: 31 %
FEF2575-%Pred-Post: 45 %
FEV1-%Change-Post: 9 %
FEV1-%PRED-PRE: 65 %
FEV1-%Pred-Post: 71 %
FEV1-POST: 2.48 L
FEV1-Pre: 2.27 L
FEV1FVC-%Change-Post: 0 %
FEV1FVC-%Pred-Pre: 75 %
FEV6-%CHANGE-POST: 7 %
FEV6-%Pred-Post: 92 %
FEV6-%Pred-Pre: 86 %
FEV6-Post: 4.07 L
FEV6-Pre: 3.78 L
FEV6FVC-%Change-Post: 0 %
FEV6FVC-%Pred-Post: 97 %
FEV6FVC-%Pred-Pre: 98 %
FVC-%CHANGE-POST: 8 %
FVC-%PRED-POST: 94 %
FVC-%Pred-Pre: 86 %
FVC-POST: 4.37 L
FVC-Pre: 4.03 L
POST FEV1/FVC RATIO: 57 %
POST FEV6/FVC RATIO: 93 %
PRE FEV1/FVC RATIO: 56 %
Pre FEV6/FVC Ratio: 94 %
RV % PRED: 63 %
RV: 1.48 L
TLC % PRED: 87 %
TLC: 6.16 L

## 2014-06-03 LAB — TYPE AND SCREEN
ABO/RH(D): B POS
Antibody Screen: NEGATIVE

## 2014-06-03 LAB — ABO/RH: ABO/RH(D): B POS

## 2014-06-03 MED ORDER — CHLORHEXIDINE GLUCONATE CLOTH 2 % EX PADS
6.0000 | MEDICATED_PAD | Freq: Once | CUTANEOUS | Status: AC
  Administered 2014-06-03: 6 via TOPICAL

## 2014-06-03 MED ORDER — EPINEPHRINE HCL 1 MG/ML IJ SOLN
0.0000 ug/min | INTRAVENOUS | Status: DC
  Filled 2014-06-03: qty 4

## 2014-06-03 MED ORDER — ALPRAZOLAM 0.25 MG PO TABS: 0.2500 mg | ORAL_TABLET | ORAL | Status: DC | PRN

## 2014-06-03 MED ORDER — DEXMEDETOMIDINE HCL IN NACL 400 MCG/100ML IV SOLN
0.1000 ug/kg/h | INTRAVENOUS | Status: AC
  Administered 2014-06-04: .3 ug/kg/h via INTRAVENOUS
  Filled 2014-06-03: qty 100

## 2014-06-03 MED ORDER — DIAZEPAM 5 MG PO TABS
5.0000 mg | ORAL_TABLET | Freq: Once | ORAL | Status: AC
  Administered 2014-06-04: 5 mg via ORAL
  Filled 2014-06-03: qty 1

## 2014-06-03 MED ORDER — DEXTROSE 5 % IV SOLN
750.0000 mg | INTRAVENOUS | Status: DC
  Filled 2014-06-03: qty 750

## 2014-06-03 MED ORDER — VANCOMYCIN HCL 10 G IV SOLR
1250.0000 mg | INTRAVENOUS | Status: AC
  Administered 2014-06-04: 1250 mg via INTRAVENOUS
  Filled 2014-06-03: qty 1250

## 2014-06-03 MED ORDER — TEMAZEPAM 15 MG PO CAPS: 15.0000 mg | ORAL_CAPSULE | Freq: Once | ORAL | Status: AC | PRN

## 2014-06-03 MED ORDER — SODIUM CHLORIDE 0.9 % IV SOLN
INTRAVENOUS | Status: AC
  Administered 2014-06-04: 2.7 [IU]/h via INTRAVENOUS
  Administered 2014-06-04: 1.3 [IU]/h via INTRAVENOUS
  Filled 2014-06-03: qty 2.5

## 2014-06-03 MED ORDER — SODIUM CHLORIDE 0.9 % IV SOLN
INTRAVENOUS | Status: AC
  Administered 2014-06-04: 69.8 mL/h via INTRAVENOUS
  Filled 2014-06-03: qty 40

## 2014-06-03 MED ORDER — DEXTROSE 5 % IV SOLN
30.0000 ug/min | INTRAVENOUS | Status: AC
  Administered 2014-06-04: 10 ug/min via INTRAVENOUS
  Filled 2014-06-03: qty 2

## 2014-06-03 MED ORDER — METOPROLOL TARTRATE 12.5 MG HALF TABLET
12.5000 mg | ORAL_TABLET | Freq: Once | ORAL | Status: AC
  Administered 2014-06-04: 12.5 mg via ORAL
  Filled 2014-06-03: qty 1

## 2014-06-03 MED ORDER — DOPAMINE-DEXTROSE 3.2-5 MG/ML-% IV SOLN
0.0000 ug/kg/min | INTRAVENOUS | Status: DC
  Filled 2014-06-03: qty 250

## 2014-06-03 MED ORDER — CEFUROXIME SODIUM 1.5 G IJ SOLR
1.5000 g | INTRAMUSCULAR | Status: AC
  Administered 2014-06-04: 1.5 g via INTRAVENOUS
  Administered 2014-06-04: .75 g via INTRAVENOUS
  Filled 2014-06-03: qty 1.5

## 2014-06-03 MED ORDER — MAGNESIUM SULFATE 50 % IJ SOLN
40.0000 meq | INTRAMUSCULAR | Status: DC
  Filled 2014-06-03: qty 10

## 2014-06-03 MED ORDER — ALBUTEROL SULFATE (2.5 MG/3ML) 0.083% IN NEBU: 2.5000 mg | INHALATION_SOLUTION | Freq: Once | RESPIRATORY_TRACT | Status: DC

## 2014-06-03 MED ORDER — CARVEDILOL 3.125 MG PO TABS
3.1250 mg | ORAL_TABLET | Freq: Two times a day (BID) | ORAL | Status: DC
  Administered 2014-06-03 (×2): 3.125 mg via ORAL
  Filled 2014-06-03 (×4): qty 1

## 2014-06-03 MED ORDER — PLASMA-LYTE 148 IV SOLN
INTRAVENOUS | Status: AC
  Administered 2014-06-04: 200 mL
  Filled 2014-06-03: qty 2.5

## 2014-06-03 MED ORDER — BISACODYL 5 MG PO TBEC
5.0000 mg | DELAYED_RELEASE_TABLET | Freq: Once | ORAL | Status: AC
  Administered 2014-06-03: 5 mg via ORAL
  Filled 2014-06-03: qty 1

## 2014-06-03 MED ORDER — NITROGLYCERIN IN D5W 200-5 MCG/ML-% IV SOLN
2.0000 ug/min | INTRAVENOUS | Status: AC
  Administered 2014-06-04: 5 ug/min via INTRAVENOUS
  Filled 2014-06-03: qty 250

## 2014-06-03 MED ORDER — POTASSIUM CHLORIDE 2 MEQ/ML IV SOLN
80.0000 meq | INTRAVENOUS | Status: DC
  Filled 2014-06-03: qty 40

## 2014-06-03 MED ORDER — SODIUM CHLORIDE 0.9 % IV SOLN
INTRAVENOUS | Status: DC
  Filled 2014-06-03: qty 30

## 2014-06-03 MED ORDER — CHLORHEXIDINE GLUCONATE CLOTH 2 % EX PADS
6.0000 | MEDICATED_PAD | Freq: Once | CUTANEOUS | Status: AC
  Administered 2014-06-04: 6 via TOPICAL

## 2014-06-03 NOTE — Progress Notes (Signed)
Utilization review completed.  

## 2014-06-03 NOTE — Care Management Note (Signed)
Case Management Note  Patient Details  Name: Ebin Palazzi. MRN: [id number] Date of Birth: [birth year]  Subjective/Objective:    Pt admitted for cardiac cath following positive high risk stress test- found severe multi vessel disease- plan for CABG on Thur. 06/04/14                Action/Plan: PTA pt from home with wife- NCM to follow post- op   Expected Discharge Date:  06/08/14               Expected Discharge Plan:  Home/Self Care  In-House Referral:     Discharge planning Services  CM Consult  Post Acute Care Choice:    Choice offered to:     DME Arranged:    DME Agency:     HH Arranged:    [place] Agency:     Status of Service:  In process, will continue to follow  Medicare Important Message Given:    Date Medicare IM Given:    Medicare IM give by:    Date Additional Medicare IM Given:    Additional Medicare Important Message give by:     If discussed at [place] of Stay Meetings, dates discussed:    Additional Comments:  Dawayne Patricia, RN 06/03/2014, 12:28 PM

## 2014-06-03 NOTE — Progress Notes (Signed)
Patient Name: Kelly Olson. Date of Encounter: 06/03/2014  Principal Problem:   Abnormal nuclear stress test - HIGH RISK Active Problems:   Tobacco abuse - PLANS TO QUIT   Angina, class II   HLD (hyperlipidemia)   Primary Cardiologist: Ellyn Hack  Patient Profile: 64 yo male w/ no previous CAD had CP w/ exertion>>abnl MV>>Cath w/ 3v dz>>CABG 05/05.  SUBJECTIVE: No chest pain at rest, missing cigs, no too bad, no SOB  OBJECTIVE Filed Vitals:   06/02/14 1320 06/02/14 1335 06/02/14 1500 06/02/14 2123  BP: 172/81 154/75 138/67 123/69  Pulse: 57 51 55 57  Temp:   97.8 F (36.6 C) 98.6 F (37 C)  TempSrc:   Oral Oral  Resp:   18 18  Height:      Weight:      SpO2: 95% 94% 95% 96%   No intake or output data in the 24 hours ending 06/03/14 0911 Filed Weights   06/02/14 5956  Weight: 160 lb (72.576 kg)    PHYSICAL EXAM General: Well developed, well nourished, male in no acute distress. Head: Normocephalic, atraumatic.  Neck: Supple without bruits, JVD mild elevation. Lungs:  Resp regular and unlabored, dry rales. Heart: RRR, S1, S2, no S3, S4, or murmur; no rub. Abdomen: Soft, non-tender, non-distended, BS + x 4.  Extremities: No clubbing, cyanosis, no edema.  Neuro: Alert and oriented X 3. Moves all extremities spontaneously. Psych: Normal affect.  LABS: CBC: Recent Labs  06/01/14 0919 06/02/14 0641  WBC 8.6 8.6  HGB 17.6* 17.9*  HCT 52.1* 52.4*  MCV 93.4 94.1  PLT 331 322   INR: Recent Labs  06/02/14 0641  INR 3.87   Basic Metabolic Panel: Recent Labs  06/01/14 0919 06/02/14 0641  NA 141 139  K 4.5 4.3  CL 101 103  CO2 24 23  GLUCOSE 102* 108*  BUN 19 21*  CREATININE 0.81 0.85  CALCIUM 9.6 9.2   Liver Function Tests: Recent Labs  06/01/14 0919  AST 27  ALT 19  ALKPHOS 80  BILITOT 0.4  PROT 7.5  ALBUMIN 4.5   TELE:   SR, Sinus brady 50s  Current Medications:  . atorvastatin  80 mg Oral q1800  . bisacodyl  5 mg Oral Once    . carvedilol  6.25 mg Oral BID  . Chlorhexidine Gluconate Cloth  6 each Topical Once   And  . [START ON 06/04/2014] Chlorhexidine Gluconate Cloth  6 each Topical Once  . [START ON 06/04/2014] diazepam  5 mg Oral Once  . losartan  25 mg Oral Daily  . [START ON 06/04/2014] metoprolol tartrate  12.5 mg Oral Once  . sodium chloride  3 mL Intravenous Q12H      ASSESSMENT AND PLAN: Principal Problem:   Abnormal nuclear stress test - HIGH RISK - for CABG am, no concerns, ready  Active Problems:   Tobacco abuse - PLANS TO QUIT - refuses rx, had increased HR w/ patch    Angina, class II - no resting sx - on Coreg, dose increased 05/03, but held last pm due to low HR - go back to previous dose, hold only for HR < 55 - On ARB    HLD (hyperlipidemia) - No recent profile, will ck in am - LFTs OK - continue statin  Signed, Barrett, Rhonda , PA-C 9:11 AM 06/03/2014   No angina reviewed cath and agree with CABG  First case in am with [place] Right radial A  Good pulse no hematoma  Jenkins Rouge

## 2014-06-03 NOTE — Progress Notes (Signed)
CARDIAC REHAB PHASE I   PRE:  Rate/Rhythm: 68 SR  BP:  Supine:   Sitting: 134/80  Standing:    SaO2: 96%RA  MODE:  Ambulation: 550 ft   POST:  Rate/Rhythm: 58 Sb  BP:  Supine:   Sitting: 142/82  Standing:    SaO2: 98%RA 0945-1022 Pt walked 550 ft with steady gait. No CP. Discussed sternal precautions and encouraged pt to practice not using arms to stand. Stressed importance of mobility and IS after surgery. RN to give IS. Gave OHS booklet and care guide. Wrote down how to view pre op video. Discussed smoking cessation and pt stated will continue cold [place] after discharge. Answered questions re activity after surgery and at home. Wife to be available to stay with pt after home for recovery. Will follow up after surgery.    Graylon Good, RN BSN  06/03/2014 10:20 AM

## 2014-06-03 NOTE — Research (Signed)
LEVO-CTS Informed Consent   Subject Name: Kelly Olson.  Subject met inclusion and exclusion criteria.  The informed consent form, study requirements and expectations were reviewed with the subject and questions and concerns were addressed prior to the signing of the consent form.  The subject verbalized understanding of the trial requirements.  The subject agreed to participate in the LEVO-CTS trial and signed the informed consent.  The informed consent was obtained prior to performance of any protocol-specific procedures for the subject.  A copy of the signed informed consent was given to the subject and a copy was placed in the subject's medical record.  Berneda Rose 06/03/2014, 4:08 PM

## 2014-06-04 ENCOUNTER — Encounter (HOSPITAL_COMMUNITY)
Admission: RE | Disposition: A | Discharge status: Home / Self Care | Payer: BLUE CROSS/BLUE SHIELD | Source: Ambulatory Visit | Attending: Pediatric Cardiology

## 2014-06-04 ENCOUNTER — Inpatient Hospital Stay (HOSPITAL_COMMUNITY): Payer: BLUE CROSS/BLUE SHIELD

## 2014-06-04 ENCOUNTER — Inpatient Hospital Stay (HOSPITAL_COMMUNITY): Payer: BLUE CROSS/BLUE SHIELD | Admitting: Anesthesiology

## 2014-06-04 DIAGNOSIS — Z951 Presence of aortocoronary bypass graft: Secondary | ICD-10-CM

## 2014-06-04 DIAGNOSIS — I25110 Atherosclerotic heart disease of native coronary artery with unstable angina pectoris: Secondary | ICD-10-CM

## 2014-06-04 DIAGNOSIS — I2510 Atherosclerotic heart disease of native coronary artery without angina pectoris: Secondary | ICD-10-CM | POA: Diagnosis present

## 2014-06-04 HISTORY — PX: CORONARY ARTERY BYPASS GRAFT: SHX141

## 2014-06-04 HISTORY — DX: Presence of aortocoronary bypass graft: Z95.1

## 2014-06-04 HISTORY — PX: TEE WITHOUT CARDIOVERSION: SHX5443

## 2014-06-04 LAB — LIPID PANEL
Cholesterol: 158 mg/dL (ref 0–200)
HDL: 54 mg/dL (ref >40)
LDL Cholesterol: 88 mg/dL (ref 0–99)
Total CHOL/HDL Ratio: 2.9 RATIO
Triglycerides: 78 mg/dL (ref <150)
VLDL: 16 mg/dL (ref 0–40)

## 2014-06-04 LAB — POCT I-STAT 4, (NA,K, GLUC, HGB,HCT)
Glucose, Bld: 105 mg/dL — ABNORMAL HIGH (ref 70–99)
HCT: 38 % — ABNORMAL LOW (ref 39.0–52.0)
Hemoglobin: 12.9 g/dL — ABNORMAL LOW (ref 13.0–17.0)
Potassium: 3.7 mmol/L (ref 3.5–5.1)
Sodium: 136 mmol/L (ref 135–145)

## 2014-06-04 LAB — PLATELET COUNT: PLATELETS: 203 10*3/uL (ref 150–400)

## 2014-06-04 LAB — POCT I-STAT, CHEM 8
BUN: 16 mg/dL (ref 6–20)
BUN: 15 mg/dL (ref 6–20)
BUN: 13 mg/dL (ref 6–20)
BUN: 14 mg/dL (ref 6–20)
BUN: 14 mg/dL (ref 6–20)
BUN: 11 mg/dL (ref 6–20)
CALCIUM ION: 1.21 mmol/L (ref 1.13–1.30)
CALCIUM ION: 1.05 mmol/L — AB (ref 1.13–1.30)
CHLORIDE: 101 mmol/L (ref 101–111)
CHLORIDE: 95 mmol/L — AB (ref 101–111)
CHLORIDE: 105 mmol/L (ref 101–111)
CREATININE: 0.5 mg/dL — AB (ref 0.61–1.24)
CREATININE: 0.5 mg/dL — AB (ref 0.61–1.24)
CREATININE: 0.7 mg/dL (ref 0.61–1.24)
Calcium, Ion: 1.19 mmol/L (ref 1.13–1.30)
Calcium, Ion: 0.97 mmol/L — ABNORMAL LOW (ref 1.13–1.30)
Calcium, Ion: 1.04 mmol/L — ABNORMAL LOW (ref 1.13–1.30)
Calcium, Ion: 1.22 mmol/L (ref 1.13–1.30)
Chloride: 101 mmol/L (ref 101–111)
Chloride: 98 mmol/L — ABNORMAL LOW (ref 101–111)
Chloride: 97 mmol/L — ABNORMAL LOW (ref 101–111)
Creatinine, Ser: 0.5 mg/dL — ABNORMAL LOW (ref 0.61–1.24)
Creatinine, Ser: 0.5 mg/dL — ABNORMAL LOW (ref 0.61–1.24)
Creatinine, Ser: 0.6 mg/dL — ABNORMAL LOW (ref 0.61–1.24)
GLUCOSE: 127 mg/dL — AB (ref 70–99)
GLUCOSE: 148 mg/dL — AB (ref 70–99)
Glucose, Bld: 151 mg/dL — ABNORMAL HIGH (ref 70–99)
Glucose, Bld: 125 mg/dL — ABNORMAL HIGH (ref 70–99)
Glucose, Bld: 134 mg/dL — ABNORMAL HIGH (ref 70–99)
Glucose, Bld: 123 mg/dL — ABNORMAL HIGH (ref 70–99)
HCT: 51 % (ref 39.0–52.0)
HCT: 47 % (ref 39.0–52.0)
HCT: 35 % — ABNORMAL LOW (ref 39.0–52.0)
HCT: 34 % — ABNORMAL LOW (ref 39.0–52.0)
HCT: 34 % — ABNORMAL LOW (ref 39.0–52.0)
HEMATOCRIT: 33 % — AB (ref 39.0–52.0)
HEMOGLOBIN: 17.3 g/dL — AB (ref 13.0–17.0)
HEMOGLOBIN: 11.9 g/dL — AB (ref 13.0–17.0)
HEMOGLOBIN: 11.6 g/dL — AB (ref 13.0–17.0)
Hemoglobin: 16 g/dL (ref 13.0–17.0)
Hemoglobin: 11.2 g/dL — ABNORMAL LOW (ref 13.0–17.0)
Hemoglobin: 11.6 g/dL — ABNORMAL LOW (ref 13.0–17.0)
POTASSIUM: 3.7 mmol/L (ref 3.5–5.1)
POTASSIUM: 4.5 mmol/L (ref 3.5–5.1)
POTASSIUM: 4.2 mmol/L (ref 3.5–5.1)
Potassium: 4 mmol/L (ref 3.5–5.1)
Potassium: 3.8 mmol/L (ref 3.5–5.1)
Potassium: 5 mmol/L (ref 3.5–5.1)
SODIUM: 136 mmol/L (ref 135–145)
SODIUM: 133 mmol/L — AB (ref 135–145)
SODIUM: 138 mmol/L (ref 135–145)
Sodium: 138 mmol/L (ref 135–145)
Sodium: 133 mmol/L — ABNORMAL LOW (ref 135–145)
Sodium: 130 mmol/L — ABNORMAL LOW (ref 135–145)
TCO2: 23 mmol/L (ref 0–100)
TCO2: 24 mmol/L (ref 0–100)
TCO2: 22 mmol/L (ref 0–100)
TCO2: 23 mmol/L (ref 0–100)
TCO2: 22 mmol/L (ref 0–100)
TCO2: 20 mmol/L (ref 0–100)

## 2014-06-04 LAB — POCT I-STAT 3, ART BLOOD GAS (G3+)
ACID-BASE DEFICIT: 2 mmol/L (ref 0.0–2.0)
ACID-BASE DEFICIT: 2 mmol/L (ref 0.0–2.0)
ACID-BASE EXCESS: 1 mmol/L (ref 0.0–2.0)
Acid-base deficit: 4 mmol/L — ABNORMAL HIGH (ref 0.0–2.0)
Acid-base deficit: 3 mmol/L — ABNORMAL HIGH (ref 0.0–2.0)
BICARBONATE: 21.7 meq/L (ref 20.0–24.0)
Bicarbonate: 26.1 mEq/L — ABNORMAL HIGH (ref 20.0–24.0)
Bicarbonate: 23.5 mEq/L (ref 20.0–24.0)
Bicarbonate: 23.5 mEq/L (ref 20.0–24.0)
Bicarbonate: 23.1 mEq/L (ref 20.0–24.0)
O2 SAT: 100 %
O2 Saturation: 100 %
O2 Saturation: 93 %
O2 Saturation: 95 %
O2 Saturation: 89 %
PCO2 ART: 46.6 mmHg — AB (ref 35.0–45.0)
PH ART: 7.348 — AB (ref 7.350–7.450)
PO2 ART: 60 mmHg — AB (ref 80.0–100.0)
Patient temperature: 35.4
Patient temperature: 37
Patient temperature: 37
TCO2: 27 mmol/L (ref 0–100)
TCO2: 25 mmol/L (ref 0–100)
TCO2: 23 mmol/L (ref 0–100)
TCO2: 25 mmol/L (ref 0–100)
TCO2: 24 mmol/L (ref 0–100)
pCO2 arterial: 43.7 mmHg (ref 35.0–45.0)
pCO2 arterial: 40.2 mmHg (ref 35.0–45.0)
pCO2 arterial: 40.2 mmHg (ref 35.0–45.0)
pCO2 arterial: 42.1 mmHg (ref 35.0–45.0)
pH, Arterial: 7.383 (ref 7.350–7.450)
pH, Arterial: 7.375 (ref 7.350–7.450)
pH, Arterial: 7.333 — ABNORMAL LOW (ref 7.350–7.450)
pH, Arterial: 7.311 — ABNORMAL LOW (ref 7.350–7.450)
pO2, Arterial: 292 mmHg — ABNORMAL HIGH (ref 80.0–100.0)
pO2, Arterial: 178 mmHg — ABNORMAL HIGH (ref 80.0–100.0)
pO2, Arterial: 64 mmHg — ABNORMAL LOW (ref 80.0–100.0)
pO2, Arterial: 82 mmHg (ref 80.0–100.0)

## 2014-06-04 LAB — APTT: aPTT: 37 seconds (ref 24–37)

## 2014-06-04 LAB — GLUCOSE, CAPILLARY
Glucose-Capillary: 98 mg/dL (ref 70–99)
Glucose-Capillary: 88 mg/dL (ref 70–99)
Glucose-Capillary: 102 mg/dL — ABNORMAL HIGH (ref 70–99)
Glucose-Capillary: 111 mg/dL — ABNORMAL HIGH (ref 70–99)
Glucose-Capillary: 192 mg/dL — ABNORMAL HIGH (ref 70–99)
Glucose-Capillary: 127 mg/dL — ABNORMAL HIGH (ref 70–99)

## 2014-06-04 LAB — HEMOGLOBIN AND HEMATOCRIT, BLOOD
HEMATOCRIT: 35 % — AB (ref 39.0–52.0)
HEMOGLOBIN: 11.8 g/dL — AB (ref 13.0–17.0)

## 2014-06-04 LAB — CBC
HCT: 54.8 % — ABNORMAL HIGH (ref 39.0–52.0)
HCT: 38 % — ABNORMAL LOW (ref 39.0–52.0)
HEMATOCRIT: 34.4 % — AB (ref 39.0–52.0)
HEMOGLOBIN: 12.7 g/dL — AB (ref 13.0–17.0)
HEMOGLOBIN: 11.5 g/dL — AB (ref 13.0–17.0)
Hemoglobin: 18.3 g/dL — ABNORMAL HIGH (ref 13.0–17.0)
MCH: 31.9 pg (ref 26.0–34.0)
MCH: 31.5 pg (ref 26.0–34.0)
MCH: 31.2 pg (ref 26.0–34.0)
MCHC: 33.4 g/dL (ref 30.0–36.0)
MCHC: 33.4 g/dL (ref 30.0–36.0)
MCHC: 33.4 g/dL (ref 30.0–36.0)
MCV: 95.6 fL (ref 78.0–100.0)
MCV: 94.3 fL (ref 78.0–100.0)
MCV: 93.2 fL (ref 78.0–100.0)
PLATELETS: 302 10*3/uL (ref 150–400)
PLATELETS: 169 10*3/uL (ref 150–400)
Platelets: 160 10*3/uL (ref 150–400)
RBC: 5.73 MIL/uL (ref 4.22–5.81)
RBC: 4.03 MIL/uL — AB (ref 4.22–5.81)
RBC: 3.69 MIL/uL — ABNORMAL LOW (ref 4.22–5.81)
RDW: 13.4 % (ref 11.5–15.5)
RDW: 13.3 % (ref 11.5–15.5)
RDW: 13.4 % (ref 11.5–15.5)
WBC: 12 10*3/uL — ABNORMAL HIGH (ref 4.0–10.5)
WBC: 18 10*3/uL — AB (ref 4.0–10.5)
WBC: 12.9 10*3/uL — ABNORMAL HIGH (ref 4.0–10.5)

## 2014-06-04 LAB — BASIC METABOLIC PANEL
Anion gap: 9 (ref 5–15)
BUN: 15 mg/dL (ref 6–20)
CHLORIDE: 100 mmol/L — AB (ref 101–111)
CO2: 27 mmol/L (ref 22–32)
CREATININE: 0.9 mg/dL (ref 0.61–1.24)
Calcium: 9.1 mg/dL (ref 8.9–10.3)
GFR calc non Af Amer: >60 mL/min (ref >60)
GLUCOSE: 113 mg/dL — AB (ref 70–99)
Potassium: 4.3 mmol/L (ref 3.5–5.1)
SODIUM: 136 mmol/L (ref 135–145)

## 2014-06-04 LAB — TROPONIN I: Troponin I: <0.03 ng/mL (ref <0.031)

## 2014-06-04 LAB — CREATININE, SERUM
CREATININE: 0.81 mg/dL (ref 0.61–1.24)
GFR calc Af Amer: >60 mL/min (ref >60)
GFR calc non Af Amer: >60 mL/min (ref >60)

## 2014-06-04 LAB — MAGNESIUM: Magnesium: 2.7 mg/dL — ABNORMAL HIGH (ref 1.7–2.4)

## 2014-06-04 LAB — CK TOTAL AND CKMB (NOT AT ARMC)
CK TOTAL: 79 U/L (ref 49–397)
CK, MB: 3.5 ng/mL (ref 0.5–5.0)
RELATIVE INDEX: INVALID (ref 0.0–2.5)

## 2014-06-04 LAB — BRAIN NATRIURETIC PEPTIDE: B Natriuretic Peptide: 272.3 pg/mL — ABNORMAL HIGH (ref 0.0–100.0)

## 2014-06-04 LAB — HEMOGLOBIN A1C
Hgb A1c MFr Bld: 6 % — ABNORMAL HIGH (ref 4.8–5.6)
MEAN PLASMA GLUCOSE: 126 mg/dL

## 2014-06-04 LAB — PROTIME-INR
INR: 1.4 (ref 0.00–1.49)
PROTHROMBIN TIME: 17.3 s — AB (ref 11.6–15.2)

## 2014-06-04 SURGERY — CORONARY ARTERY BYPASS GRAFTING (CABG)
Anesthesia: General | Site: Chest

## 2014-06-04 MED ORDER — ROCURONIUM BROMIDE 50 MG/5ML IV SOLN
INTRAVENOUS | Status: AC
  Filled 2014-06-04: qty 2

## 2014-06-04 MED ORDER — FENTANYL CITRATE (PF) 250 MCG/5ML IJ SOLN
INTRAMUSCULAR | Status: AC
  Filled 2014-06-04: qty 5

## 2014-06-04 MED ORDER — SODIUM CHLORIDE 0.9 % IV SOLN
INTRAVENOUS | Status: DC
  Filled 2014-06-04: qty 2.5

## 2014-06-04 MED ORDER — SODIUM BICARBONATE 8.4 % IV SOLN
50.0000 meq | Freq: Once | INTRAVENOUS | Status: AC
  Administered 2014-06-04: 50 meq via INTRAVENOUS

## 2014-06-04 MED ORDER — ALBUMIN HUMAN 5 % IV SOLN
250.0000 mL | INTRAVENOUS | Status: AC | PRN
  Administered 2014-06-04 (×3): 250 mL via INTRAVENOUS
  Filled 2014-06-04: qty 250

## 2014-06-04 MED ORDER — MORPHINE SULFATE 2 MG/ML IJ SOLN
2.0000 mg | INTRAMUSCULAR | Status: DC | PRN
  Administered 2014-06-06 (×2): 2 mg via INTRAVENOUS
  Filled 2014-06-04 (×2): qty 1
  Filled 2014-06-04: qty 2

## 2014-06-04 MED ORDER — LACTATED RINGERS IV SOLN: 500.0000 mL | Freq: Once | INTRAVENOUS | Status: AC | PRN

## 2014-06-04 MED ORDER — FAMOTIDINE IN NACL 20-0.9 MG/50ML-% IV SOLN
20.0000 mg | Freq: Two times a day (BID) | INTRAVENOUS | Status: DC
  Administered 2014-06-04: 20 mg via INTRAVENOUS

## 2014-06-04 MED ORDER — MIDAZOLAM HCL 10 MG/2ML IJ SOLN
INTRAMUSCULAR | Status: AC
  Filled 2014-06-04: qty 2

## 2014-06-04 MED ORDER — PROPOFOL 10 MG/ML IV BOLUS
INTRAVENOUS | Status: DC | PRN
  Administered 2014-06-04: 60 mg via INTRAVENOUS

## 2014-06-04 MED ORDER — INSULIN ASPART 100 UNIT/ML ~~LOC~~ SOLN: 0.0000 [IU] | SUBCUTANEOUS | Status: DC

## 2014-06-04 MED ORDER — METOPROLOL TARTRATE 12.5 MG HALF TABLET
12.5000 mg | ORAL_TABLET | Freq: Two times a day (BID) | ORAL | Status: DC
  Administered 2014-06-06: 12.5 mg via ORAL
  Filled 2014-06-04 (×5): qty 1

## 2014-06-04 MED ORDER — STUDY - INVESTIGATIONAL DRUG SIMPLE RECORD
0.1000 ug/kg/min | Status: AC
  Filled 2014-06-04: qty 0

## 2014-06-04 MED ORDER — LACTATED RINGERS IV SOLN: INTRAVENOUS | Status: DC

## 2014-06-04 MED ORDER — FENTANYL CITRATE (PF) 100 MCG/2ML IJ SOLN
INTRAMUSCULAR | Status: DC | PRN
  Administered 2014-06-04: 150 ug via INTRAVENOUS
  Administered 2014-06-04: 750 ug via INTRAVENOUS
  Administered 2014-06-04: 100 ug via INTRAVENOUS
  Administered 2014-06-04 (×2): 250 ug via INTRAVENOUS

## 2014-06-04 MED ORDER — SODIUM CHLORIDE 0.9 % IV SOLN: INTRAVENOUS | Status: DC

## 2014-06-04 MED ORDER — 0.9 % SODIUM CHLORIDE (POUR BTL) OPTIME
TOPICAL | Status: DC | PRN
  Administered 2014-06-04: 2000 mL

## 2014-06-04 MED ORDER — BISACODYL 10 MG RE SUPP: 10.0000 mg | Freq: Every day | RECTAL | Status: DC

## 2014-06-04 MED ORDER — POTASSIUM CHLORIDE 10 MEQ/50ML IV SOLN
10.0000 meq | INTRAVENOUS | Status: AC
  Administered 2014-06-04 (×3): 10 meq via INTRAVENOUS

## 2014-06-04 MED ORDER — DEXTROSE 5 % IV SOLN
0.0000 ug/min | INTRAVENOUS | Status: DC
  Filled 2014-06-04: qty 2

## 2014-06-04 MED ORDER — ACETAMINOPHEN 160 MG/5ML PO SOLN: 1000.0000 mg | Freq: Four times a day (QID) | ORAL | Status: DC

## 2014-06-04 MED ORDER — NITROGLYCERIN IN D5W 200-5 MCG/ML-% IV SOLN: 0.0000 ug/min | INTRAVENOUS | Status: DC

## 2014-06-04 MED ORDER — ACETAMINOPHEN 160 MG/5ML PO SOLN: 650.0000 mg | Freq: Once | ORAL | Status: AC

## 2014-06-04 MED ORDER — SODIUM CHLORIDE 0.9 % IV SOLN
0.2000 ug/kg/min | INTRAVENOUS | Status: DC
  Administered 2014-06-04: .2 ug/kg/min via INTRAVENOUS
  Filled 2014-06-04: qty 0

## 2014-06-04 MED ORDER — PROTAMINE SULFATE 10 MG/ML IV SOLN
INTRAVENOUS | Status: DC | PRN
  Administered 2014-06-04: 230 mg via INTRAVENOUS

## 2014-06-04 MED ORDER — HEPARIN SODIUM (PORCINE) 1000 UNIT/ML IJ SOLN
INTRAMUSCULAR | Status: AC
  Filled 2014-06-04: qty 1

## 2014-06-04 MED ORDER — METOPROLOL TARTRATE 25 MG/10 ML ORAL SUSPENSION
12.5000 mg | Freq: Two times a day (BID) | ORAL | Status: DC
  Filled 2014-06-04 (×5): qty 5

## 2014-06-04 MED ORDER — OXYCODONE HCL 5 MG PO TABS
5.0000 mg | ORAL_TABLET | ORAL | Status: DC | PRN
  Administered 2014-06-07: 10 mg via ORAL
  Filled 2014-06-04: qty 2

## 2014-06-04 MED ORDER — MAGNESIUM SULFATE 4 GM/100ML IV SOLN
4.0000 g | Freq: Once | INTRAVENOUS | Status: AC
  Administered 2014-06-04: 4 g via INTRAVENOUS
  Filled 2014-06-04: qty 100

## 2014-06-04 MED ORDER — HEPARIN SODIUM (PORCINE) 1000 UNIT/ML IJ SOLN
INTRAMUSCULAR | Status: DC | PRN
  Administered 2014-06-04: 26000 [IU] via INTRAVENOUS

## 2014-06-04 MED ORDER — MIDAZOLAM HCL 2 MG/2ML IJ SOLN: 2.0000 mg | INTRAMUSCULAR | Status: DC | PRN

## 2014-06-04 MED ORDER — ASPIRIN 81 MG PO CHEW: 324.0000 mg | CHEWABLE_TABLET | Freq: Every day | ORAL | Status: DC

## 2014-06-04 MED ORDER — CHLORHEXIDINE GLUCONATE 0.12 % MT SOLN
15.0000 mL | Freq: Two times a day (BID) | OROMUCOSAL | Status: DC
  Administered 2014-06-04: 15 mL via OROMUCOSAL
  Filled 2014-06-04: qty 15

## 2014-06-04 MED ORDER — THROMBIN 20000 UNITS EX SOLR
OROMUCOSAL | Status: DC | PRN
  Administered 2014-06-04: 8 mL via TOPICAL

## 2014-06-04 MED ORDER — STUDY - INVESTIGATIONAL DRUG SIMPLE RECORD
0.1000 ug/kg/min | Status: DC
  Filled 2014-06-04: qty 0

## 2014-06-04 MED ORDER — PANTOPRAZOLE SODIUM 40 MG PO TBEC
40.0000 mg | DELAYED_RELEASE_TABLET | Freq: Every day | ORAL | Status: DC
  Administered 2014-06-06 – 2014-06-08 (×3): 40 mg via ORAL
  Filled 2014-06-04 (×2): qty 1

## 2014-06-04 MED ORDER — PROTAMINE SULFATE 10 MG/ML IV SOLN
INTRAVENOUS | Status: AC
  Filled 2014-06-04: qty 25

## 2014-06-04 MED ORDER — SODIUM CHLORIDE 0.9 % IV SOLN
250.0000 mL | INTRAVENOUS | Status: DC
  Administered 2014-06-05: 250 mL via INTRAVENOUS

## 2014-06-04 MED ORDER — BISACODYL 5 MG PO TBEC
10.0000 mg | DELAYED_RELEASE_TABLET | Freq: Every day | ORAL | Status: DC
  Administered 2014-06-05 – 2014-06-06 (×2): 10 mg via ORAL
  Filled 2014-06-04: qty 2

## 2014-06-04 MED ORDER — MORPHINE SULFATE 2 MG/ML IJ SOLN: 1.0000 mg | INTRAMUSCULAR | Status: AC | PRN

## 2014-06-04 MED ORDER — LACTATED RINGERS IV SOLN
INTRAVENOUS | Status: DC | PRN
  Administered 2014-06-04 (×2): via INTRAVENOUS

## 2014-06-04 MED ORDER — NITROGLYCERIN 0.2 MG/ML ON CALL CATH LAB
INTRAVENOUS | Status: DC | PRN
  Administered 2014-06-04: 40 ug via INTRAVENOUS

## 2014-06-04 MED ORDER — ACETAMINOPHEN 500 MG PO TABS
1000.0000 mg | ORAL_TABLET | Freq: Four times a day (QID) | ORAL | Status: DC
  Administered 2014-06-05 – 2014-06-07 (×10): 1000 mg via ORAL
  Filled 2014-06-04 (×17): qty 2

## 2014-06-04 MED ORDER — DEXMEDETOMIDINE HCL IN NACL 200 MCG/50ML IV SOLN: 0.0000 ug/kg/h | INTRAVENOUS | Status: DC

## 2014-06-04 MED ORDER — ASPIRIN EC 325 MG PO TBEC
325.0000 mg | DELAYED_RELEASE_TABLET | Freq: Every day | ORAL | Status: DC
  Administered 2014-06-05 – 2014-06-08 (×4): 325 mg via ORAL
  Filled 2014-06-04 (×4): qty 1

## 2014-06-04 MED ORDER — TRAMADOL HCL 50 MG PO TABS: 50.0000 mg | ORAL_TABLET | ORAL | Status: DC | PRN

## 2014-06-04 MED ORDER — LACTATED RINGERS IV SOLN
INTRAVENOUS | Status: DC | PRN
  Administered 2014-06-04 (×2): via INTRAVENOUS

## 2014-06-04 MED ORDER — SODIUM CHLORIDE 0.9 % IJ SOLN
3.0000 mL | Freq: Two times a day (BID) | INTRAMUSCULAR | Status: DC
  Administered 2014-06-05: 3 mL via INTRAVENOUS
  Administered 2014-06-05: 10 mL via INTRAVENOUS
  Administered 2014-06-06 – 2014-06-08 (×5): 3 mL via INTRAVENOUS

## 2014-06-04 MED ORDER — HEMOSTATIC AGENTS (NO CHARGE) OPTIME
TOPICAL | Status: DC | PRN
  Administered 2014-06-04: 1 via TOPICAL

## 2014-06-04 MED ORDER — METOPROLOL TARTRATE 1 MG/ML IV SOLN: 2.5000 mg | INTRAVENOUS | Status: DC | PRN

## 2014-06-04 MED ORDER — LIDOCAINE HCL (CARDIAC) 20 MG/ML IV SOLN
INTRAVENOUS | Status: AC
  Filled 2014-06-04: qty 5

## 2014-06-04 MED ORDER — DEXTROSE 5 % IV SOLN
1.5000 g | Freq: Two times a day (BID) | INTRAVENOUS | Status: AC
  Administered 2014-06-04 – 2014-06-06 (×4): 1.5 g via INTRAVENOUS
  Filled 2014-06-04 (×4): qty 1.5

## 2014-06-04 MED ORDER — ACETAMINOPHEN 650 MG RE SUPP
650.0000 mg | Freq: Once | RECTAL | Status: AC
  Administered 2014-06-04: 650 mg via RECTAL

## 2014-06-04 MED ORDER — LACTATED RINGERS IV SOLN
INTRAVENOUS | Status: DC | PRN
  Administered 2014-06-04: 06:00:00 via INTRAVENOUS

## 2014-06-04 MED ORDER — LACTATED RINGERS IV SOLN: INTRAVENOUS | Status: DC | PRN

## 2014-06-04 MED ORDER — SODIUM CHLORIDE 0.45 % IV SOLN
INTRAVENOUS | Status: DC | PRN
  Administered 2014-06-04: 20 mL/h via INTRAVENOUS

## 2014-06-04 MED ORDER — PROPOFOL 10 MG/ML IV BOLUS
INTRAVENOUS | Status: AC
  Filled 2014-06-04: qty 20

## 2014-06-04 MED ORDER — CETYLPYRIDINIUM CHLORIDE 0.05 % MT LIQD
7.0000 mL | Freq: Four times a day (QID) | OROMUCOSAL | Status: DC
  Administered 2014-06-04 – 2014-06-05 (×4): 7 mL via OROMUCOSAL

## 2014-06-04 MED ORDER — SUCCINYLCHOLINE CHLORIDE 20 MG/ML IJ SOLN
INTRAMUSCULAR | Status: AC
  Filled 2014-06-04: qty 1

## 2014-06-04 MED ORDER — ALBUMIN HUMAN 5 % IV SOLN
INTRAVENOUS | Status: DC | PRN
  Administered 2014-06-04: 12:00:00 via INTRAVENOUS

## 2014-06-04 MED ORDER — DOCUSATE SODIUM 100 MG PO CAPS
200.0000 mg | ORAL_CAPSULE | Freq: Every day | ORAL | Status: DC
  Administered 2014-06-05 – 2014-06-06 (×2): 200 mg via ORAL
  Filled 2014-06-04 (×2): qty 2

## 2014-06-04 MED ORDER — MIDAZOLAM HCL 5 MG/5ML IJ SOLN
INTRAMUSCULAR | Status: DC | PRN
  Administered 2014-06-04: 3 mg via INTRAVENOUS
  Administered 2014-06-04: 2 mg via INTRAVENOUS
  Administered 2014-06-04: 5 mg via INTRAVENOUS
  Administered 2014-06-04 (×2): 3 mg via INTRAVENOUS

## 2014-06-04 MED ORDER — THROMBIN 20000 UNITS EX SOLR
CUTANEOUS | Status: AC
  Filled 2014-06-04: qty 20000

## 2014-06-04 MED ORDER — VANCOMYCIN HCL IN DEXTROSE 1-5 GM/200ML-% IV SOLN
1000.0000 mg | Freq: Once | INTRAVENOUS | Status: AC
  Administered 2014-06-04: 1000 mg via INTRAVENOUS
  Filled 2014-06-04: qty 200

## 2014-06-04 MED ORDER — SODIUM CHLORIDE 0.9 % IJ SOLN: 3.0000 mL | INTRAMUSCULAR | Status: DC | PRN

## 2014-06-04 MED ORDER — ROCURONIUM BROMIDE 100 MG/10ML IV SOLN
INTRAVENOUS | Status: DC | PRN
  Administered 2014-06-04: 20 mg via INTRAVENOUS
  Administered 2014-06-04 (×2): 50 mg via INTRAVENOUS
  Administered 2014-06-04 (×2): 20 mg via INTRAVENOUS

## 2014-06-04 MED ORDER — INSULIN REGULAR BOLUS VIA INFUSION
0.0000 [IU] | Freq: Three times a day (TID) | INTRAVENOUS | Status: DC
  Filled 2014-06-04: qty 10

## 2014-06-04 MED ORDER — ONDANSETRON HCL 4 MG/2ML IJ SOLN
4.0000 mg | Freq: Four times a day (QID) | INTRAMUSCULAR | Status: DC | PRN
  Administered 2014-06-04 – 2014-06-05 (×2): 4 mg via INTRAVENOUS
  Filled 2014-06-04 (×2): qty 2

## 2014-06-04 MED ORDER — SODIUM CHLORIDE 0.9 % IJ SOLN
INTRAMUSCULAR | Status: AC
  Filled 2014-06-04: qty 20

## 2014-06-04 MED FILL — Heparin Sodium (Porcine) Inj 1000 Unit/ML: INTRAMUSCULAR | Qty: 10 | Status: AC

## 2014-06-04 MED FILL — Sodium Chloride IV Soln 0.9%: INTRAVENOUS | Qty: 2000 | Status: AC

## 2014-06-04 MED FILL — Lidocaine HCl IV Inj 20 MG/ML: INTRAVENOUS | Qty: 5 | Status: AC

## 2014-06-04 MED FILL — Mannitol IV Soln 20%: INTRAVENOUS | Qty: 500 | Status: AC

## 2014-06-04 MED FILL — Electrolyte-R (PH 7.4) Solution: INTRAVENOUS | Qty: 4000 | Status: AC

## 2014-06-04 MED FILL — Sodium Bicarbonate IV Soln 8.4%: INTRAVENOUS | Qty: 50 | Status: AC

## 2014-06-04 SURGICAL SUPPLY — 101 items
ADH SKN CLS APL DERMABOND .7 (GAUZE/BANDAGES/DRESSINGS) ×2
BAG DECANTER FOR FLEXI CONT (MISCELLANEOUS) ×4 IMPLANT
BANDAGE ELASTIC 4 VELCRO ST LF (GAUZE/BANDAGES/DRESSINGS) ×4 IMPLANT
BANDAGE ELASTIC 6 VELCRO ST LF (GAUZE/BANDAGES/DRESSINGS) ×4 IMPLANT
BASKET HEART  (ORDER IN 25'S) (MISCELLANEOUS) ×1
BASKET HEART (ORDER IN 25'S) (MISCELLANEOUS) ×1
BASKET HEART (ORDER IN 25S) (MISCELLANEOUS) ×2 IMPLANT
BLADE STERNUM SYSTEM 6 (BLADE) ×4 IMPLANT
BLADE SURG 11 STRL SS (BLADE) ×2 IMPLANT
BNDG GAUZE ELAST 4 BULKY (GAUZE/BANDAGES/DRESSINGS) ×4 IMPLANT
CANISTER SUCTION 2500CC (MISCELLANEOUS) ×4 IMPLANT
CATH ROBINSON RED A/P 18FR (CATHETERS) ×8 IMPLANT
CATH THORACIC 28FR (CATHETERS) ×4 IMPLANT
CATH THORACIC 36FR (CATHETERS) ×4 IMPLANT
CATH THORACIC 36FR RT ANG (CATHETERS) ×4 IMPLANT
CLIP TI MEDIUM 24 (CLIP) IMPLANT
CLIP TI WIDE RED SMALL 24 (CLIP) ×8 IMPLANT
COVER SURGICAL LIGHT HANDLE (MISCELLANEOUS) ×4 IMPLANT
CRADLE DONUT ADULT HEAD (MISCELLANEOUS) ×4 IMPLANT
DERMABOND ADVANCED (GAUZE/BANDAGES/DRESSINGS) ×2
DERMABOND ADVANCED .7 DNX12 (GAUZE/BANDAGES/DRESSINGS) IMPLANT
DRAPE CARDIOVASCULAR INCISE (DRAPES) ×4
DRAPE SLUSH/WARMER DISC (DRAPES) ×4 IMPLANT
DRAPE SRG 135X102X78XABS (DRAPES) ×2 IMPLANT
DRSG COVADERM 4X14 (GAUZE/BANDAGES/DRESSINGS) ×4 IMPLANT
ELECT CAUTERY BLADE 6.4 (BLADE) ×4 IMPLANT
ELECT REM PT RETURN 9FT ADLT (ELECTROSURGICAL) ×8
ELECTRODE REM PT RTRN 9FT ADLT (ELECTROSURGICAL) ×4 IMPLANT
GAUZE SPONGE 4X4 12PLY STRL (GAUZE/BANDAGES/DRESSINGS) ×8 IMPLANT
GLOVE BIO SURGEON STRL SZ 6 (GLOVE) IMPLANT
GLOVE BIO SURGEON STRL SZ 6.5 (GLOVE) IMPLANT
GLOVE BIO SURGEON STRL SZ7 (GLOVE) IMPLANT
GLOVE BIO SURGEON STRL SZ7.5 (GLOVE) IMPLANT
GLOVE BIO SURGEONS STRL SZ 6.5 (GLOVE)
GLOVE BIOGEL PI IND STRL 6 (GLOVE) IMPLANT
GLOVE BIOGEL PI IND STRL 6.5 (GLOVE) IMPLANT
GLOVE BIOGEL PI IND STRL 7.0 (GLOVE) IMPLANT
GLOVE BIOGEL PI INDICATOR 6 (GLOVE)
GLOVE BIOGEL PI INDICATOR 6.5 (GLOVE)
GLOVE BIOGEL PI INDICATOR 7.0 (GLOVE)
GLOVE EUDERMIC 7 POWDERFREE (GLOVE) ×8 IMPLANT
GLOVE ORTHO TXT STRL SZ7.5 (GLOVE) IMPLANT
GOWN STRL REUS W/ TWL LRG LVL3 (GOWN DISPOSABLE) ×8 IMPLANT
GOWN STRL REUS W/ TWL XL LVL3 (GOWN DISPOSABLE) ×2 IMPLANT
GOWN STRL REUS W/TWL LRG LVL3 (GOWN DISPOSABLE) ×16
GOWN STRL REUS W/TWL XL LVL3 (GOWN DISPOSABLE) ×4
HEMOSTAT POWDER SURGIFOAM 1G (HEMOSTASIS) ×12 IMPLANT
HEMOSTAT SURGICEL 2X14 (HEMOSTASIS) ×4 IMPLANT
INSERT FOGARTY 61MM (MISCELLANEOUS) IMPLANT
INSERT FOGARTY XLG (MISCELLANEOUS) IMPLANT
KIT BASIN OR (CUSTOM PROCEDURE TRAY) ×4 IMPLANT
KIT CATH CPB BARTLE (MISCELLANEOUS) ×4 IMPLANT
KIT ROOM TURNOVER OR (KITS) ×4 IMPLANT
KIT SUCTION CATH 14FR (SUCTIONS) ×4 IMPLANT
KIT VASOVIEW W/TROCAR VH 2000 (KITS) ×4 IMPLANT
NS IRRIG 1000ML POUR BTL (IV SOLUTION) ×20 IMPLANT
PACK OPEN HEART (CUSTOM PROCEDURE TRAY) ×4 IMPLANT
PAD ARMBOARD 7.5X6 YLW CONV (MISCELLANEOUS) ×8 IMPLANT
PAD ELECT DEFIB RADIOL ZOLL (MISCELLANEOUS) ×4 IMPLANT
PENCIL BUTTON HOLSTER BLD 10FT (ELECTRODE) ×4 IMPLANT
PUNCH AORTIC ROTATE 4.0MM (MISCELLANEOUS) IMPLANT
PUNCH AORTIC ROTATE 4.5MM 8IN (MISCELLANEOUS) ×4 IMPLANT
PUNCH AORTIC ROTATE 5MM 8IN (MISCELLANEOUS) IMPLANT
SPONGE GAUZE 4X4 12PLY STER LF (GAUZE/BANDAGES/DRESSINGS) ×4 IMPLANT
SPONGE INTESTINAL PEANUT (DISPOSABLE) IMPLANT
SPONGE LAP 18X18 X RAY DECT (DISPOSABLE) IMPLANT
SPONGE LAP 4X18 X RAY DECT (DISPOSABLE) ×4 IMPLANT
SUT BONE WAX W31G (SUTURE) ×4 IMPLANT
SUT MNCRL AB 4-0 PS2 18 (SUTURE) IMPLANT
SUT PROLENE 3 0 SH DA (SUTURE) IMPLANT
SUT PROLENE 3 0 SH1 36 (SUTURE) ×4 IMPLANT
SUT PROLENE 4 0 RB 1 (SUTURE)
SUT PROLENE 4 0 SH DA (SUTURE) IMPLANT
SUT PROLENE 4-0 RB1 .5 CRCL 36 (SUTURE) IMPLANT
SUT PROLENE 5 0 C 1 36 (SUTURE) IMPLANT
SUT PROLENE 6 0 C 1 30 (SUTURE) ×2 IMPLANT
SUT PROLENE 7 0 BV 1 (SUTURE) IMPLANT
SUT PROLENE 7 0 BV1 MDA (SUTURE) ×6 IMPLANT
SUT PROLENE 8 0 BV175 6 (SUTURE) ×2 IMPLANT
SUT SILK  1 MH (SUTURE)
SUT SILK 1 MH (SUTURE) IMPLANT
SUT STEEL STERNAL CCS#1 18IN (SUTURE) IMPLANT
SUT STEEL SZ 6 DBL 3X14 BALL (SUTURE) IMPLANT
SUT VIC AB 1 CTX 36 (SUTURE) ×8
SUT VIC AB 1 CTX36XBRD ANBCTR (SUTURE) ×4 IMPLANT
SUT VIC AB 2-0 CT1 27 (SUTURE) ×4
SUT VIC AB 2-0 CT1 TAPERPNT 27 (SUTURE) IMPLANT
SUT VIC AB 2-0 CTX 27 (SUTURE) IMPLANT
SUT VIC AB 3-0 SH 27 (SUTURE)
SUT VIC AB 3-0 SH 27X BRD (SUTURE) IMPLANT
SUT VIC AB 3-0 X1 27 (SUTURE) IMPLANT
SUT VICRYL 4-0 PS2 18IN ABS (SUTURE) ×2 IMPLANT
SUTURE E-PAK OPEN HEART (SUTURE) ×4 IMPLANT
SYSTEM SAHARA CHEST DRAIN ATS (WOUND CARE) ×4 IMPLANT
TAPE CLOTH SURG 4X10 WHT LF (GAUZE/BANDAGES/DRESSINGS) ×2 IMPLANT
TOWEL OR 17X24 6PK STRL BLUE (TOWEL DISPOSABLE) ×4 IMPLANT
TOWEL OR 17X26 10 PK STRL BLUE (TOWEL DISPOSABLE) ×4 IMPLANT
TRAY FOLEY IC TEMP SENS 16FR (CATHETERS) ×4 IMPLANT
TUBING INSUFFLATION (TUBING) ×4 IMPLANT
UNDERPAD 30X30 INCONTINENT (UNDERPADS AND DIAPERS) ×4 IMPLANT
WATER STERILE IRR 1000ML POUR (IV SOLUTION) ×8 IMPLANT

## 2014-06-04 NOTE — Progress Notes (Signed)
  Echocardiogram Echocardiogram Transesophageal has been performed.  Kelly Olson 06/04/2014, 8:29 AM

## 2014-06-04 NOTE — CV Procedure (Signed)
Intra-operative Transesophageal Echocardiography Report:  [name] is a 64 year old male with severe three-vessel coronary artery disease who was scheduled to undergo coronary artery bypass grafting by Dr. Cyndia Bent. Intraoperative transesophageal echocardiography was requested to evaluate the right and left ventricular function, to serve as a monitor for intraoperative volume status, to assess for any valvular pathology, and to assess for intracardiac air.  The patient was brought to the operating room at [facility] and general anesthesia was induced without difficulty. Following endotracheal intubation and orogastric suctioning, the transesophageal echocardiography probe was inserted into the esophagus without difficulty.  Impression: Pre-bypass findings:  1. Aortic valve: The aortic valve was trileaflet. The leaflets opened normally without restriction. There was no was no aortic insufficiency.  2. Mitral valve: The mitral leaflets coapted normally without prolapsing or flail segments. There was trace mitral insufficiency.  3. Left ventricle: There was mild left ventricular dysfunction. The ejection fraction was estimated at 45-50%. There was hypokinesis of the inferior wall in the mid to apical region. Left ventricular size was normal. Left ventricular end-diastolic diameter measured 47 mm at end diastole at the mid-papillary level in the transgastric short axis view. LV wall thickness was normal. The anterior wall measured 0.755 cm and the posterior wall measured 0.836 cm at end diastole at the mid-papillary level in the  transgastric short axis view.  4. Right ventricle: The right ventricular size was normal. There was normal appearing contractility of the right ventricular free wall. The tricuspid annular systolic excursion measured 16 mm.  5. Tricuspid valve: The tricuspid valve showed normal appearing leaflets and there was trace tricuspid insufficiency..  6. Interatrial  septum: The interatrial septum was intact without evidence of patent foramen ovale or atrial septal defect by color Doppler or bubble study.  7. Left atrium: There was no thrombus noted within left atrium left atrial appendage.   8. Ascending aorta: The ascending aorta showed a well-defined aortic root and sinotubular ridge without dilation or effacement. There was no significant atheromatous disease noted in the ascending aorta.  9. Descending aorta: There was scattered mild atheromatous disease noted within the descending aorta. The descending aorta was 2.2 cm in diameter.   Post-bypass findings:  1. Aortic valve: The aortic valve was unchanged from the pre-bypass study. There was no aortic insufficiency and the leaflets opened without restriction.  2. Mitral valve: The mitral leaflets opened normally and there was trace mitral insufficiency.  3. Left ventricle: The ejection fraction was estimated at 45-50%. There was persistent hypokinesis of the distal anterior wall and anterior septum. There was no intracardiac air noted.  4. Right ventricle: The right ventricular function appeared normal. There was good contractility of the right ventricular free wall.  5. Tricuspid valve: There was trace tricuspid insufficiency which appeared unchanged from the pre-bypass study.  Roberts Gaudy, MD

## 2014-06-04 NOTE — H&P (View-Only) (Signed)
[address]       [name] [address]             [phone]      Cardiothoracic Surgery Consultation   Reason for Consult: Severe multi-vessel coronary artery disease Referring Physician:  Dr. Mauro Kaufmann. is an 64 y.o. male.  HPI:   The patient is a 64 year old gentleman with a family history of coronary artery disease, smoking until last week who started exercising about 6 weeks ago after his wife got him a Fitbit. He has been walking up to 7 miles per day but has noticed that when his heart rate got up to 120's he started having chest tightness and pain in his forearms with tingling in his fingers. This resolved with rest. He was seeing Dr. Laurann Montana for follow up after starting a statin and mentioned these symptoms as he was leaving and Dr. Laurann Montana got an ECG which showed some changes compared to his old ECG. He has a nuclear stress test that was high risk with an EF of 33% with anterior, apical and inferoapical wall motion abnormalities. Cardiac cath today shows severe 3 vessel disease with an occluded proximal LAD after a large septal with collaterals filling the LAD from the left and right, an occluded mid RCA with the PDA filling from left collaterals and a 90% complex lesion in the proximal LCX.  He lives with his wife and continues to work more than full-time at [employer] where he has worked for over 40 years.   Past medical history: hyperlipidemia  Past surgical history: tonsillectomy as a child   Family history: Father had dilated cardiomyopathy but does not know if he had coronary disease. Grandfather had MI, Uncle had MI.  Social History:  reports that he quit smoking 4 days ago. He does not have any smokeless tobacco history on file. He reports that he drinks about 4.2 - 8.4 oz of alcohol per week. He reports that he does not use illicit drugs.  Allergies: No Known Allergies  Medications:  I have reviewed the  patient's current medications. Prior to Admission:  Prescriptions prior to admission  Medication Sig Dispense Refill Last Dose  . aspirin 81 MG tablet Take 81 mg by mouth daily.   06/02/2014 at Unknown time  . atorvastatin (LIPITOR) 10 MG tablet Take 1 tablet by mouth daily.   06/02/2014 at Unknown time  . [DISCONTINUED] carvedilol (COREG) 3.125 MG tablet Take 1 tablet (3.125 mg total) by mouth 2 (two) times daily. 60 tablet 5 06/02/2014 at Unknown time   Scheduled: . atorvastatin  80 mg Oral q1800  . carvedilol  6.25 mg Oral BID  . losartan  25 mg Oral Daily  . sodium chloride  3 mL Intravenous Q12H   Continuous:  BWG:YKZLDJ chloride, acetaminophen, morphine injection, ondansetron (ZOFRAN) IV, sodium chloride Anti-infectives    None      Results for orders placed or performed during the hospital encounter of 06/02/14 (from the past 48 hour(s))  CBC     Status: Abnormal   Collection Time: 06/02/14  6:41 AM  Result Value Ref Range   WBC 8.6 4.0 - 10.5 K/uL   RBC 5.57 4.22 - 5.81 MIL/uL   Hemoglobin 17.9 (H) 13.0 - 17.0 g/dL   HCT 52.4 (H) 39.0 - 52.0 %   MCV 94.1 78.0 - 100.0 fL   MCH 32.1 26.0 - 34.0 pg   MCHC  34.2 30.0 - 36.0 g/dL   RDW 13.4 11.5 - 15.5 %   Platelets 322 150 - 400 K/uL  Basic metabolic panel     Status: Abnormal   Collection Time: 06/02/14  6:41 AM  Result Value Ref Range   Sodium 139 135 - 145 mmol/L   Potassium 4.3 3.5 - 5.1 mmol/L   Chloride 103 101 - 111 mmol/L   CO2 23 22 - 32 mmol/L   Glucose, Bld 108 (H) 70 - 99 mg/dL   BUN 21 (H) 6 - 20 mg/dL   Creatinine, Ser 0.85 0.61 - 1.24 mg/dL   Calcium 9.2 8.9 - 10.3 mg/dL   GFR calc non Af Amer >60 >60 mL/min   GFR calc Af Amer >60 >60 mL/min    Comment: (NOTE) The eGFR has been calculated using the CKD EPI equation. This calculation has not been validated in all clinical situations. eGFR's persistently <90 mL/min signify possible Chronic Kidney Disease.    Anion gap 13 5 - 15  Protime-INR     Status:  None   Collection Time: 06/02/14  6:41 AM  Result Value Ref Range   Prothrombin Time 13.2 11.6 - 15.2 seconds   INR 0.99 0.00 - 1.49    No results found.  Review of Systems  Constitutional: Negative for fever, chills, weight loss and malaise/fatigue.  HENT: Negative.   Eyes: Negative.   Respiratory: Negative for shortness of breath.   Cardiovascular: Positive for chest pain. Negative for palpitations, orthopnea, leg swelling and PND.  Gastrointestinal: Negative.   Genitourinary: Negative.   Musculoskeletal:       Exertional pain in forearms  Skin: Negative.   Neurological: Positive for tingling.       In fingers with exertion  Endo/Heme/Allergies: Negative.   Psychiatric/Behavioral: Negative.    Blood pressure 154/75, pulse 51, temperature 98 F (36.7 C), temperature source Oral, resp. rate 0, height $RemoveB'5\' 11"'qohNisxb$  (1.803 m), weight 72.576 kg (160 lb), SpO2 94 %. Physical Exam  Constitutional: He is oriented to person, place, and time. He appears well-developed and well-nourished. No distress.  HENT:  Head: Normocephalic and atraumatic.  Mouth/Throat: Oropharynx is clear and moist.  Eyes: EOM are normal. Pupils are equal, round, and reactive to light.  Neck: Normal range of motion. Neck supple. No JVD present. No thyromegaly present.  Cardiovascular: Normal rate, regular rhythm, normal heart sounds and intact distal pulses.   No murmur heard. Respiratory: Effort normal and breath sounds normal. No respiratory distress. He has no rales.  GI: Soft. Bowel sounds are normal. He exhibits no distension and no mass. There is no tenderness.  Musculoskeletal: He exhibits no edema.  Lymphadenopathy:    He has no cervical adenopathy.  Neurological: He is alert and oriented to person, place, and time. He has normal strength. No cranial nerve deficit or sensory deficit.  Skin: Skin is warm and dry.  Psychiatric: He has a normal mood and affect.    Assessment/Plan:  He has severe,  symptomatic multi-vessel coronary artery disease with moderate LV dysfunction and a high risk nuclear stress test. I agree that CABG is the best treatment for him. I discussed the operative procedure with the patient including alternatives, benefits and risks; including but not limited to bleeding, blood transfusion, infection, stroke, myocardial infarction, graft failure, heart block requiring a permanent pacemaker, organ dysfunction, and death.  Kelly Olson. understands and agrees to proceed.  We will schedule surgery for Thursday.  Gaye Pollack 06/02/2014, 5:26  PM      

## 2014-06-04 NOTE — Progress Notes (Signed)
CT surgery p.m. Rounds  Status post multivessel CABG Patient extubated and comfortable Chest tube drainage 100- 150 cc/h 6 hour postoperative hematocrit 34% Stable hemodynamics

## 2014-06-04 NOTE — Progress Notes (Signed)
Spoke with Blossom Hoops, RN with research on Levosimendan drug. Reordered drip per previous order. Medication is currently running and is to stay at 8 ml/hr overnight until d/c'd at 0811. Dr. Cyndia Bent aware of this rate. No complications at this time.

## 2014-06-04 NOTE — Procedures (Signed)
Extubation Procedure Note  Patient Details:   Name: Kelly Olson. DOB: 1950-10-26 MRN: [id number]   Airway Documentation:     Evaluation  O2 sats: stable throughout Complications: No apparent complications Patient did tolerate procedure well. Bilateral Breath Sounds: Clear   Yes  Pt extubated to 2lpm [place] per protocal. NIF-40, VC1200.  Duffy Rhody 06/04/2014, 6:04 PM

## 2014-06-04 NOTE — Transfer of Care (Signed)
Immediate Anesthesia Transfer of Care Note  Patient: Kelly Olson.  Procedure(s) Performed: Procedure(s): CORONARY ARTERY BYPASS GRAFTING (CABG) times four using bilateral internal mammary arteries and EVH for left  leg saphenous vein (N/A) TRANSESOPHAGEAL ECHOCARDIOGRAM (TEE) (N/A)  Patient Location: SICU  Anesthesia Type:General  Level of Consciousness: Patient remains intubated per anesthesia plan  Airway & Oxygen Therapy: Patient remains intubated per anesthesia plan and Patient placed on Ventilator (see vital sign flow sheet for setting)  Post-op Assessment: Report given to RN  Post vital signs: Reviewed and stable  Last Vitals:  Filed Vitals:   06/04/14 1600  BP: 95/61  Pulse: 79  Temp: 36.2 C  Resp: 14    Complications: No apparent anesthesia complications

## 2014-06-04 NOTE — Anesthesia Preprocedure Evaluation (Signed)
Anesthesia Evaluation  Patient identified by MRN, date of birth, ID band Patient awake    Reviewed: Allergy & Precautions, NPO status , Unable to perform ROS - Chart review only  Airway Mallampati: II  TM Distance: >3 FB Neck ROM: Full    Dental  (+) Teeth Intact   Pulmonary former smoker,  breath sounds clear to auscultation        Cardiovascular Rhythm:Regular Rate:Normal     Neuro/Psych    GI/Hepatic   Endo/Other    Renal/GU      Musculoskeletal   Abdominal   Peds  Hematology   Anesthesia Other Findings   Reproductive/Obstetrics                             Anesthesia Physical Anesthesia Plan  ASA: III  Anesthesia Plan: General   Post-op Pain Management:    Induction: Intravenous  Airway Management Planned: Oral ETT  Additional Equipment: Arterial line, CVP, PA Cath and 3D TEE  Intra-op Plan:   Post-operative Plan:   Informed Consent: I have reviewed the patients History and Physical, chart, labs and discussed the procedure including the risks, benefits and alternatives for the proposed anesthesia with the patient or authorized representative who has indicated his/her understanding and acceptance.     Plan Discussed with: CRNA and Anesthesiologist  Anesthesia Plan Comments:         Anesthesia Quick Evaluation

## 2014-06-04 NOTE — Brief Op Note (Signed)
06/02/2014 - 06/04/2014  11:32 AM  PATIENT:  Kelly Olson.  64 y.o. male  PRE-OPERATIVE DIAGNOSIS:  CAD  POST-OPERATIVE DIAGNOSIS:  CAD  PROCEDURE:  TRANSESOPHAGEAL ECHOCARDIOGRAM (TEE), MEDIAN STERNOTOMY for CORONARY ARTERY BYPASS GRAFTING (CABG) times 4 (RIMA to LAD,  SVG to DIAGONAL, LIMA to CIRCUMFLEX, and SVG to PDA) using bilateral internal mammary arteries and EVH for left thigh leg saphenous vein   SURGEON:  Surgeon(s) and Role:    * Gaye Pollack, MD - Primary  PHYSICIAN ASSISTANT: Lars Pinks PA-C  ANESTHESIA:   general  EBL:  Total I/O In: -  Out: 100 [Urine:100]  DRAINS: Chest tubes placed in the mediastinal and pleural spaces   COUNTS CORRECT:  YES  DICTATION: .Dragon Dictation  PLAN OF CARE: Admit to inpatient   PATIENT DISPOSITION:  ICU - intubated and hemodynamically stable.   Delay start of Pharmacological VTE agent (>24hrs) due to surgical blood loss or risk of bleeding: yes  BASELINE WEIGHT: 73 kg

## 2014-06-04 NOTE — Op Note (Signed)
CARDIOVASCULAR SURGERY OPERATIVE NOTE  06/04/2014  Surgeon:  Gaye Pollack, MD  First Assistant: Lars Pinks, PA-C   Preoperative Diagnosis:  Severe multi-vessel coronary artery disease   Postoperative Diagnosis:  Same   Procedure:  1. Median Sternotomy 2. Extracorporeal circulation 3.   Coronary artery bypass grafting x 4   Left internal mammary graft to the OM  Right internal mammary graft to the LAD  SVG to diagonal  SVG to PDA 4.   Endoscopic vein harvest from the left leg. Examined vein in the right leg.   Anesthesia:  General Endotracheal   Clinical History/Surgical Indication:  The patient is a 64 year old gentleman with a family history of coronary artery disease, smoking until last week who started exercising about 6 weeks ago after his wife got him a Fitbit. He has been walking up to 7 miles per day but has noticed that when his heart rate got up to 120's he started having chest tightness and pain in his forearms with tingling in his fingers. This resolved with rest. He was seeing Dr. Laurann Montana for follow up after starting a statin and mentioned these symptoms as he was leaving and Dr. Laurann Montana got an ECG which showed some changes compared to his old ECG. He has a nuclear stress test that was high risk with an EF of 33% with anterior, apical and inferoapical wall motion abnormalities. Cardiac cath today shows severe 3 vessel disease with an occluded proximal LAD after a large septal with collaterals filling the LAD from the left and right, an occluded mid RCA with the PDA filling from left collaterals and a 90% complex lesion in the proximal LCX.  He has severe, symptomatic multi-vessel coronary artery disease with moderate LV dysfunction and a high risk nuclear stress test. I agree that CABG is the best treatment for him. I discussed the operative procedure with the  patient including alternatives, benefits and risks; including but not limited to bleeding, blood transfusion, infection, stroke, myocardial infarction, graft failure, heart block requiring a permanent pacemaker, organ dysfunction, and death. Kelly Olson. understands and agrees to proceed.  The patient was consented for the Levosimendan drug trial but the [employer]   Preparation:  The patient was seen in the preoperative holding area and the correct patient, correct operation were confirmed with the patient after reviewing the medical record and catheterization. The consent was signed by me. Preoperative antibiotics were given. A pulmonary arterial line and radial arterial line were placed by the anesthesia team. The patient was taken back to the operating room and positioned supine on the operating room table. After being placed under general endotracheal anesthesia by the anesthesia team a foley catheter was placed. The neck, chest, abdomen, and both legs were prepped with betadine soap and solution and draped in the usual sterile manner. A surgical time-out was taken and the correct patient and operative procedure were confirmed with the nursing and anesthesia staff.   Cardiopulmonary Bypass:  A median sternotomy was performed. The pericardium was opened in the midline. Right ventricular function appeared normal. The ascending aorta was of normal size and had no palpable plaque. There were no contraindications to aortic cannulation or cross-clamping. The patient was fully systemically heparinized and the ACT was maintained > 400 sec. The proximal aortic arch was cannulated with a 20 F aortic cannula for arterial inflow. Venous cannulation was performed via the right atrial appendage using a two-staged venous cannula. An antegrade cardioplegia/vent cannula was inserted  into the mid-ascending aorta. Aortic occlusion was performed with a single cross-clamp. Systemic cooling to 32  degrees Centigrade and topical cooling of the heart with iced saline were used. Hyperkalemic antegrade cold blood cardioplegia was used to induce diastolic arrest and was then given at about 20 minute intervals throughout the period of arrest to maintain myocardial temperature at or below 10 degrees centigrade. A temperature probe was inserted into the interventricular septum and an insulating pad was placed in the pericardium.   Left internal mammary harvest:  The left side of the sternum was retracted using the Rultract retractor. The left internal mammary artery was harvested as a pedicle graft. All side branches were clipped. It was a medium-sized vessel of good quality with excellent blood flow. It was ligated distally and divided. It was sprayed with topical papaverine solution to prevent vasospasm.  Right internal mammary harvest:  The right side of the sternum was retracted using the Rultract retractor. The right internal mammary artery was harvested as a pedicle graft. All side branches were clipped. It was a medium-sized vessel of good quality with excellent blood flow. It was ligated distally and divided. It was sprayed with topical papaverine solution to prevent vasospasm.   Endoscopic vein harvest:  We initially examined the right greater saphenous vein adjacent to the knee and it was small. It was examined endoscopically going up the thigh about halfway but it became smaller and did not look adequate so it was not dissected any further and no branches were ligated. The left greater saphenous vein was harvested endoscopically through a 2 cm incision medial to the left knee. It was harvested from the thigh. It was a medium-sized vein of good quality. The side branches were all ligated with 4-0 silk ties.    Coronary arteries:  The coronary arteries were examined.   LAD:  Large vessel with no distal disease. The diagonal was small but graftable.  LCX:  The OM branches were  moderate sized vessels and both communicated with each other on cath.  RCA:  The PDA was small with no disease and was graftable. The RCA itself was severely and diffusely diseased.   Grafts:  1. RIMA to the LAD: 2.5 mm. It was sewn end to side using 8-0 prolene continuous suture. 2. LIMA to OM:  2.0 mm. It was sewn end to side using 8-0 prolene continuous suture. 3. SVG to Diag:  1.5 mm. It was sewn end to side using 7-0 prolene continuous suture. 4. SVG to PDA:  1.5 mm. It was sewn end to side using 7-0 prolene continuous suture.  The proximal vein graft anastomoses were performed to the mid-ascending aorta using continuous 6-0 prolene suture. Graft markers were placed around the proximal anastomoses.   Completion:  The patient was rewarmed to 37 degrees Centigrade. The clamp was removed from the [place] and LIMA pedicles and there was rapid warming of the septum and return of ventricular fibrillation. The crossclamp was removed with a time of 92 minutes. There was spontaneous return of sinus rhythm. The distal and proximal anastomoses were checked for hemostasis. The position of the grafts was satisfactory. Two temporary epicardial pacing wires were placed on the right atrium and two on the right ventricle. The patient was weaned from CPB without difficulty on no inotropes. CPB time was 112 minutes. Cardiac output was 8 LPM. Heparin was fully reversed with protamine and the aortic and venous cannulas removed. Hemostasis was achieved. Mediastinal and left pleural drainage tubes were  placed. The sternum was closed with double #6 stainless steel wires. The fascia was closed with continuous # 1 vicryl suture. The subcutaneous tissue was closed with 2-0 vicryl continuous suture. The skin was closed with 3-0 vicryl subcuticular suture. All sponge, needle, and instrument counts were reported correct at the end of the case. Dry sterile dressings were placed over the incisions and around the chest tubes  which were connected to pleurevac suction. The patient was then transported to the surgical intensive care unit in critical but stable condition.

## 2014-06-04 NOTE — Anesthesia Procedure Notes (Signed)
Procedure Name: Intubation Date/Time: 06/04/2014 7:45 AM Performed by: Izora Gala Pre-anesthesia Checklist: Patient identified, Emergency Drugs available, Suction available and Patient being monitored Patient Re-evaluated:Patient Re-evaluated prior to inductionOxygen Delivery Method: Circle system utilized Preoxygenation: Pre-oxygenation with 100% oxygen Intubation Type: IV induction Ventilation: Mask ventilation without difficulty and Oral airway inserted - appropriate to patient size Laryngoscope Size: Miller and 3 Grade View: Grade II Tube type: Oral Tube size: 8.0 mm Number of attempts: 1 Airway Equipment and Method: Stylet Placement Confirmation: ETT inserted through vocal cords under direct vision,  positive ETCO2 and breath sounds checked- equal and bilateral Secured at: 23 cm Dental Injury: Teeth and Oropharynx as per pre-operative assessment

## 2014-06-04 NOTE — Interval H&P Note (Signed)
History and Physical Interval Note:  06/04/2014 7:23 AM  Kelly Olson.  has presented today for surgery, with the diagnosis of CAD  The various methods of treatment have been discussed with the patient and family. After consideration of risks, benefits and other options for treatment, the patient has consented to  Procedure(s): CORONARY ARTERY BYPASS GRAFTING (CABG) (N/A) TRANSESOPHAGEAL ECHOCARDIOGRAM (TEE) (N/A) as a surgical intervention .  The patient's history has been reviewed, patient examined, no change in status, stable for surgery.  I have reviewed the patient's chart and labs.  Questions were answered to the patient's satisfaction.     Gaye Pollack

## 2014-06-05 ENCOUNTER — Inpatient Hospital Stay (HOSPITAL_COMMUNITY): Payer: BLUE CROSS/BLUE SHIELD

## 2014-06-05 LAB — CBC
HCT: 32.4 % — ABNORMAL LOW (ref 39.0–52.0)
HCT: 34.1 % — ABNORMAL LOW (ref 39.0–52.0)
HEMOGLOBIN: 11.1 g/dL — AB (ref 13.0–17.0)
Hemoglobin: 11.4 g/dL — ABNORMAL LOW (ref 13.0–17.0)
MCH: 32.6 pg (ref 26.0–34.0)
MCH: 31.4 pg (ref 26.0–34.0)
MCHC: 34.3 g/dL (ref 30.0–36.0)
MCHC: 33.4 g/dL (ref 30.0–36.0)
MCV: 95.3 fL (ref 78.0–100.0)
MCV: 93.9 fL (ref 78.0–100.0)
PLATELETS: 166 10*3/uL (ref 150–400)
Platelets: 172 10*3/uL (ref 150–400)
RBC: 3.4 MIL/uL — AB (ref 4.22–5.81)
RBC: 3.63 MIL/uL — AB (ref 4.22–5.81)
RDW: 13.5 % (ref 11.5–15.5)
RDW: 13.5 % (ref 11.5–15.5)
WBC: 15 10*3/uL — ABNORMAL HIGH (ref 4.0–10.5)
WBC: 15.5 10*3/uL — ABNORMAL HIGH (ref 4.0–10.5)

## 2014-06-05 LAB — POCT I-STAT, CHEM 8
BUN: 15 mg/dL (ref 6–20)
CHLORIDE: 99 mmol/L — AB (ref 101–111)
CREATININE: 0.6 mg/dL — AB (ref 0.61–1.24)
Calcium, Ion: 1.2 mmol/L (ref 1.13–1.30)
Glucose, Bld: 135 mg/dL — ABNORMAL HIGH (ref 70–99)
HCT: 35 % — ABNORMAL LOW (ref 39.0–52.0)
HEMOGLOBIN: 11.9 g/dL — AB (ref 13.0–17.0)
POTASSIUM: 4.1 mmol/L (ref 3.5–5.1)
Sodium: 138 mmol/L (ref 135–145)
TCO2: 23 mmol/L (ref 0–100)

## 2014-06-05 LAB — GLUCOSE, CAPILLARY
GLUCOSE-CAPILLARY: 107 mg/dL — AB (ref 70–99)
GLUCOSE-CAPILLARY: 111 mg/dL — AB (ref 70–99)
GLUCOSE-CAPILLARY: 116 mg/dL — AB (ref 70–99)
GLUCOSE-CAPILLARY: 120 mg/dL — AB (ref 70–99)
GLUCOSE-CAPILLARY: 131 mg/dL — AB (ref 70–99)
GLUCOSE-CAPILLARY: 133 mg/dL — AB (ref 70–99)
GLUCOSE-CAPILLARY: 163 mg/dL — AB (ref 70–99)
Glucose-Capillary: 132 mg/dL — ABNORMAL HIGH (ref 70–99)
Glucose-Capillary: 105 mg/dL — ABNORMAL HIGH (ref 70–99)
Glucose-Capillary: 110 mg/dL — ABNORMAL HIGH (ref 70–99)

## 2014-06-05 LAB — CREATININE, SERUM
CREATININE: 0.69 mg/dL (ref 0.61–1.24)
GFR calc Af Amer: >60 mL/min (ref >60)
GFR calc non Af Amer: >60 mL/min (ref >60)

## 2014-06-05 LAB — BASIC METABOLIC PANEL
Anion gap: 6 (ref 5–15)
BUN: 10 mg/dL (ref 6–20)
CALCIUM: 7.7 mg/dL — AB (ref 8.9–10.3)
CO2: 26 mmol/L (ref 22–32)
Chloride: 108 mmol/L (ref 101–111)
Creatinine, Ser: 0.74 mg/dL (ref 0.61–1.24)
Glucose, Bld: 113 mg/dL — ABNORMAL HIGH (ref 70–99)
POTASSIUM: 3.8 mmol/L (ref 3.5–5.1)
Sodium: 140 mmol/L (ref 135–145)

## 2014-06-05 LAB — MAGNESIUM
MAGNESIUM: 2.2 mg/dL (ref 1.7–2.4)
MAGNESIUM: 2.1 mg/dL (ref 1.7–2.4)

## 2014-06-05 LAB — CK TOTAL AND CKMB (NOT AT ARMC)
CK TOTAL: 358 U/L (ref 49–397)
CK, MB: 13.6 ng/mL — ABNORMAL HIGH (ref 0.5–5.0)
CK, MB: 10.4 ng/mL — ABNORMAL HIGH (ref 0.5–5.0)
RELATIVE INDEX: 3 — AB (ref 0.0–2.5)
Relative Index: 3.8 — ABNORMAL HIGH (ref 0.0–2.5)
Total CK: 349 U/L (ref 49–397)

## 2014-06-05 LAB — TROPONIN I
Troponin I: 1.16 ng/mL (ref <0.031)
Troponin I: 0.84 ng/mL (ref <0.031)

## 2014-06-05 MED ORDER — INSULIN ASPART 100 UNIT/ML ~~LOC~~ SOLN
0.0000 [IU] | SUBCUTANEOUS | Status: DC
  Administered 2014-06-05: 2 [IU] via SUBCUTANEOUS

## 2014-06-05 MED ORDER — METOCLOPRAMIDE HCL 5 MG/ML IJ SOLN
20.0000 mg | Freq: Four times a day (QID) | INTRAVENOUS | Status: DC
  Filled 2014-06-05 (×3): qty 4

## 2014-06-05 MED ORDER — CETYLPYRIDINIUM CHLORIDE 0.05 % MT LIQD
7.0000 mL | Freq: Two times a day (BID) | OROMUCOSAL | Status: DC
  Administered 2014-06-05: 7 mL via OROMUCOSAL

## 2014-06-05 MED ORDER — METOCLOPRAMIDE HCL 5 MG/ML IJ SOLN
10.0000 mg | Freq: Four times a day (QID) | INTRAMUSCULAR | Status: AC
  Administered 2014-06-05 – 2014-06-06 (×3): 10 mg via INTRAVENOUS
  Filled 2014-06-05 (×6): qty 2

## 2014-06-05 MED ORDER — FUROSEMIDE 10 MG/ML IJ SOLN
INTRAMUSCULAR | Status: AC
  Filled 2014-06-05: qty 2

## 2014-06-05 MED ORDER — INSULIN ASPART 100 UNIT/ML ~~LOC~~ SOLN
0.0000 [IU] | SUBCUTANEOUS | Status: DC
  Administered 2014-06-05: 2 [IU] via SUBCUTANEOUS
  Administered 2014-06-05 – 2014-06-06 (×2): 4 [IU] via SUBCUTANEOUS

## 2014-06-05 MED ORDER — FUROSEMIDE 10 MG/ML IJ SOLN
20.0000 mg | Freq: Once | INTRAMUSCULAR | Status: AC
  Administered 2014-06-05: 20 mg via INTRAVENOUS

## 2014-06-05 NOTE — Anesthesia Postprocedure Evaluation (Signed)
  Anesthesia Post-op Note  Patient: Kelly Olson.  Procedure(s) Performed: Procedure(s): CORONARY ARTERY BYPASS GRAFTING (CABG) times four using bilateral internal mammary arteries and EVH for left  leg saphenous vein (N/A) TRANSESOPHAGEAL ECHOCARDIOGRAM (TEE) (N/A)  Patient Location: SICU  Anesthesia Type:General  Level of Consciousness: sedated and Patient remains intubated per anesthesia plan  Airway and Oxygen Therapy: Patient remains intubated per anesthesia plan and Patient placed on Ventilator (see vital sign flow sheet for setting)  Post-op Pain: none  Post-op Assessment: Post-op Vital signs reviewed, Patient's Cardiovascular Status Stable, Respiratory Function Stable, Patent Airway and Pain level controlled  Post-op Vital Signs: stable  Last Vitals:  Filed Vitals:   06/05/14 1400  BP: 103/58  Pulse: 70  Temp:   Resp: 22    Complications: No apparent anesthesia complications

## 2014-06-05 NOTE — Discharge Summary (Signed)
Physician Discharge Summary       [address]       [place] [address]             [phone]    Patient ID: [name] MRN: [id number] DOB/AGE: February 26, 1950 64 y.o.  Admit date: 06/02/2014 Discharge date: 06/08/2014  Admission Diagnoses: 1. CAD 2. History of hyperlipidemia 3. History of tobacco abuse  Discharge Diagnoses:  1. CAD 2. History of hyperlipidemia 3. History of tobacco abuse 4. ABL anemia  Procedure (s):  1. Median Sternotomy 2. Extracorporeal circulation 3. Coronary artery bypass grafting x 4   Left internal mammary graft to the OM  Right internal mammary graft to the LAD  SVG to diagonal  SVG to PDA 4. Endoscopic vein harvest from the left leg. Examined vein in the right leg.  History of Presenting Illness: This is a 64 year old gentleman with a family history of coronary artery disease, smoking until last week who started exercising about 6 weeks ago after his wife got him a Fitbit. He has been walking up to 7 miles per day but has noticed that when his heart rate got up to 120's he started having chest tightness and pain in his forearms with tingling in his fingers. This resolved with rest. He was seeing Dr. Laurann Montana for follow up after starting a statin and mentioned these symptoms as he was leaving and Dr. Laurann Montana got an ECG which showed some changes compared to his old ECG. He has a nuclear stress test that was high risk with an EF of 33% with anterior, apical and inferoapical wall motion abnormalities. Cardiac cath today shows severe 3 vessel disease with an occluded proximal LAD after a large septal with collaterals filling the LAD from the left and right, an occluded mid RCA with the PDA filling from left collaterals and a 90% complex lesion in the proximal LCX.  He lives with his wife and continues to work more than full-time at [employer] where he has worked for over 40 years.   Dr. Cyndia Bent discussed the  need for coronary artery bypass grafting surgery. Potential risks, benefits, and complications were discussed and the patient agreed to proceed with surgery. He underwent a CABG x 4 on 06/04/2014.   Brief Hospital Course:  The patient was extubated the evening of surgery without difficulty. He remained afebrile and hemodynamically stable. Gordy Councilman, a line, chest tubes, and foley were removed early in the post operative course. Lopressor was started and titrated accordingly. He was volume over loaded and diuresed. He  had ABL anemia. He did not require a post op transfusion. His last H and H was 10.8 and 32.4 . He was weaned off the insulin drip. The patient's glucose remained well controlled. The patient's HGA1C pre op was 6.  The patient was felt surgically stable for transfer from the ICU to PCTU for further convalescence on 06/06/2014. He continues to progress with cardiac rehab. He was ambulating on room air. He has been tolerating a diet and has had a bowel movement. Epicardial pacing wires and chest tube sutures will be removed prior to discharge. The patient is felt surgically stable for discharge today.   Latest Vital Signs: Blood pressure 124/63, pulse 87, temperature 98.7 F (37.1 C), temperature source Oral, resp. rate 16, height '5\' 11"'$  (1.803 m), weight 158 lb 4.6 oz (71.8 kg), SpO2 94 %.  Physical Exam: General appearance: alert and cooperative Neurologic: intact Heart: regular rate  and rhythm, S1, S2 normal, no murmur, click, rub or gallop Lungs: clear to auscultation bilaterally Extremities: edema mild Wound: dressing dry  Discharge Condition:Stable and discharged to home  Recent laboratory studies:  Lab Results  Component Value Date   WBC 16.2* 06/07/2014   HGB 10.8* 06/07/2014   HCT 32.4* 06/07/2014   MCV 93.9 06/07/2014   PLT 190 06/07/2014   Lab Results  Component Value Date   NA 133* 06/08/2014   K 3.3* 06/08/2014   CL 98* 06/08/2014   CO2 28 06/08/2014    CREATININE 0.79 06/08/2014   GLUCOSE 173* 06/08/2014    Diagnostic Studies:   EXAM: CHEST - 2 VIEW  COMPARISON: the previous day's study  FINDINGS: Interval removal of bilateral chest tubes ; no pneumothorax evident. Small bilateral pleural effusions persist. Changes of CABG. Heart size upper limits normal. Mild interstitial edema or infiltrates involving bases more than apices, slightly more conspicuous than on previous portable exam.  IMPRESSION: 1. Bilateral chest tube removal with no pneumothorax. 2. Mild bibasilar interstitial edema, slightly increased since previous exam   Electronically Signed  By: Lucrezia Europe M.D.  On: 06/07/2014 08:39       Discharge Instructions    Amb Referral to Cardiac Rehabilitation    Complete by:  As directed   Congestive Heart Failure: If diagnosis is Heart Failure, patient MUST meet each of the CMS criteria: 1. Left Ventricular Ejection Fraction </= 35% 2. NYHA class II-IV symptoms despite being on optimal heart failure therapy for at least 6 weeks. 3. Stable = have not had a recent (<6 weeks) or planned (<6 months) major cardiovascular hospitalization or procedure  Program Details: - Physician supervised classes - 1-3 classes per week over a 12-18 week period, generally for a total of 36 sessions  Physician Certification: I certify that the above Cardiac Rehabilitation treatment is medically necessary and is medically approved by me for treatment of this patient. The patient is willing and cooperative, able to ambulate and medically stable to participate in exercise rehabilitation. The participant's progress and Individualized Treatment Plan will be reviewed by the Medical Director, Cardiac Rehab staff and as indicated by the Referring/Ordering Physician.  Diagnosis:  CABG          Discharge Medications:   Medication List       aspirin 81 MG tablet  Replaced by:  aspirin 325 MG EC tablet     atorvastatin 80 MG tablet    Commonly known as:  LIPITOR     carvedilol 3.125 MG tablet  Commonly known as:  COREG      TAKE these medications        aspirin 325 MG EC tablet  Take 1 tablet (325 mg total) by mouth daily.     losartan 25 MG tablet  Commonly known as:  COZAAR  Take 0.5 tablets (12.5 mg total) by mouth daily.     oxyCODONE 5 MG immediate release tablet  Commonly known as:  Oxy IR/ROXICODONE  Take 1-2 tablets (5-10 mg total) by mouth every 6 (six) hours as needed for severe pain.       The patient has been discharged on:   1.Beta Blocker:  Yes [ x  ]                              No   [   ]  If No, reason:  2.Ace Inhibitor/ARB: Yes [ x  ]                                     No  [    ]                                     If No, reason:  3.Statin:   Yes [ x  ]                  No  [   ]                  If No, reason:  4.Ecasa:  Yes  [ x  ]                  No   [   ]                  If No, reason:  Follow Up Appointments: Follow-up Information    Follow up with Gaye Pollack, MD On 07/15/2014.   Specialty:  Cardiothoracic Surgery   Why:  PA/LAT CXR to be taken (at [facility] which is in the same building as Dr. Vivi Martens office) on 07/14/2013 at 8:30 am;Appointment time is at 9:30 am   Contact information:   [place] [address] [phone]       Follow up with Isaiah Serge, NP On 07/13/2014.   Specialties:  Cardiology, Radiology   Why:  Appointment time is at 8:00 am   Contact information:   [address] [place] [address] [phone]       Follow up with Leona.   Why:  nurse for chest tube suture removal- office will call   Contact information:   [place] [id number]       Signed: Lars Pinks MPA-C 06/23/2014, 1:36 PM

## 2014-06-05 NOTE — Progress Notes (Signed)
[facility] BRIEF SICU PROGRESS NOTE  1 Day Post-Op  S/P Procedure(s) (LRB): CORONARY ARTERY BYPASS GRAFTING (CABG) times four using bilateral internal mammary arteries and EVH for left  leg saphenous vein (N/A) TRANSESOPHAGEAL ECHOCARDIOGRAM (TEE) (N/A)   Stable day NSR w/ stable BP O2 sats 98-100% on 1 L/min UOP adequate Labs okay  Plan: Continue current plan  Rexene Alberts 06/05/2014 6:35 PM

## 2014-06-05 NOTE — Progress Notes (Signed)
CRITICAL VALUE ALERT  Critical value received:  Troponin 1.16, CKMB 13.6, Relative index 3.8  Date of notification:  5/6  Time of notification:  0300  Critical value read back:Yes.    Nurse who received alert:  [employer] RN  notified (1st page): Berneda Rose     Time of first page:  0630

## 2014-06-05 NOTE — Progress Notes (Signed)
Anesthesiology Follow-up:  Awake and alert, sitting in chair, neuro intact, minimal pain  VS: T- 37.2 BP- 103/58 RR- 22 HR- 70 (a-paced) O2 Sat 97% on RA  K-3.8 glucose -126 BUN/Cr. -10/0.74 H/H- 11.1/32.4 Plts- 172,000  Extubated 5 hours post-op.  64 year old male 1 day S/P CABG X 4 for severe 3V CAD, stable post-op course, no apparent complications.  Kelly Olson

## 2014-06-05 NOTE — Progress Notes (Signed)
1 Day Post-Op Procedure(s) (LRB): CORONARY ARTERY BYPASS GRAFTING (CABG) times four using bilateral internal mammary arteries and EVH for left  leg saphenous vein (N/A) TRANSESOPHAGEAL ECHOCARDIOGRAM (TEE) (N/A) Subjective:  No complaints  Objective: Vital signs in last 24 hours: Temp:  [95.7 F (35.4 C)-98.8 F (37.1 C)] 97.9 F (36.6 C) (05/06 0700) Pulse Rate:  [67-90] 70 (05/06 0700) Cardiac Rhythm:  [-] Atrial paced (05/06 0730) Resp:  [11-27] 18 (05/06 0700) BP: (87-103)/(55-65) 97/57 mmHg (05/06 0700) SpO2:  [92 %-100 %] 95 % (05/06 0700) Arterial Line BP: (80-123)/(43-65) 105/49 mmHg (05/06 0700) FiO2 (%):  [40 %-50 %] 40 % (05/05 1721) Weight:  [75 kg (165 lb 5.5 oz)] 75 kg (165 lb 5.5 oz) (05/06 0600)  Hemodynamic parameters for last 24 hours: PAP: (23-49)/(11-31) 26/14 mmHg CO:  [3.5 L/min-6.4 L/min] 5.1 L/min CI:  [1.8 L/min/m2-3.3 L/min/m2] 2.7 L/min/m2  Intake/Output from previous day: 05/05 0701 - 05/06 0700 In: 5007.8 [I.V.:2763.8; IV Piggyback:2244] Out: 7121 [Urine:3995; Blood:1575; Chest Tube:1090] Intake/Output this shift: Total I/O In: -  Out: 210 [Urine:40; Chest Tube:170]  General appearance: alert and cooperative Neurologic: intact Heart: regular rate and rhythm, S1, S2 normal, no murmur, click, rub or gallop Lungs: clear to auscultation bilaterally Extremities: edema mild Wound: dressing dry  Lab Results:  Recent Labs  06/04/14 1850 06/04/14 1852 06/05/14 0240  WBC 12.9*  --  15.0*  HGB 11.5* 11.6* 11.1*  HCT 34.4* 34.0* 32.4*  PLT 160  --  172   BMET:  Recent Labs  06/04/14 0528  06/04/14 1852 06/05/14 0240  NA 136  < > 138 140  K 4.3  < > 4.2 3.8  CL 100*  < > 105 108  CO2 27  --   --  26  GLUCOSE 113*  < > 148* 113*  BUN 15  < > 11 10  CREATININE 0.90  < > 0.70 0.74  CALCIUM 9.1  --   --  7.7*  < > = values in this interval not displayed.  PT/INR:  Recent Labs  06/04/14 1320  LABPROT 17.3*  INR 1.40   ABG     Component Value Date/Time   PHART 7.348* 06/04/2014 1851   HCO3 23.1 06/04/2014 1851   TCO2 20 06/04/2014 1852   ACIDBASEDEF 2.0 06/04/2014 1851   O2SAT 89.0 06/04/2014 1851   CBG (last 3)   Recent Labs  06/05/14 0135 06/05/14 0234 06/05/14 0337  GLUCAP 111* 107* 105*   CXR: ok  ECG: sinus, no acute changes. Old anterior MI. Assessment/Plan: S/P Procedure(s) (LRB): CORONARY ARTERY BYPASS GRAFTING (CABG) times four using bilateral internal mammary arteries and EVH for left  leg saphenous vein (N/A) TRANSESOPHAGEAL ECHOCARDIOGRAM (TEE) (N/A)  He is hemodynamically stable  Still has some dark bloody drainage from the MT so will keep his tubes in for now and dangle more today.  Hgb has been stable.  DC swan and A-line.  Completing study drug this am.   LOS: 3 days    Gaye Pollack 06/05/2014

## 2014-06-05 NOTE — Plan of Care (Signed)
Problem: Phase II - Intermediate Post-Op Goal: Wean to Extubate Outcome: Completed/Met Date Met:  06/05/14 06/04/2014

## 2014-06-05 NOTE — Progress Notes (Signed)
Study drug turned off at 0811 per research team. Will continue to monitor.

## 2014-06-05 NOTE — Progress Notes (Signed)
Utilization Review Completed.  

## 2014-06-05 NOTE — Addendum Note (Signed)
Addendum  created 06/05/14 1619 by Roberts Gaudy, MD   Modules edited: Notes Section   Notes Section:  File: [id number]; Fraser: [id number]

## 2014-06-06 ENCOUNTER — Inpatient Hospital Stay (HOSPITAL_COMMUNITY): Payer: BLUE CROSS/BLUE SHIELD

## 2014-06-06 LAB — BASIC METABOLIC PANEL
Anion gap: 7 (ref 5–15)
BUN: 13 mg/dL (ref 6–20)
CO2: 28 mmol/L (ref 22–32)
Calcium: 8 mg/dL — ABNORMAL LOW (ref 8.9–10.3)
Chloride: 101 mmol/L (ref 101–111)
Creatinine, Ser: 0.69 mg/dL (ref 0.61–1.24)
GLUCOSE: 85 mg/dL (ref 70–99)
POTASSIUM: 3.4 mmol/L — AB (ref 3.5–5.1)
Sodium: 136 mmol/L (ref 135–145)

## 2014-06-06 LAB — BRAIN NATRIURETIC PEPTIDE: B Natriuretic Peptide: 583.9 pg/mL — ABNORMAL HIGH (ref 0.0–100.0)

## 2014-06-06 LAB — CBC
HCT: 35 % — ABNORMAL LOW (ref 39.0–52.0)
HEMOGLOBIN: 11.7 g/dL — AB (ref 13.0–17.0)
MCH: 31.5 pg (ref 26.0–34.0)
MCHC: 33.4 g/dL (ref 30.0–36.0)
MCV: 94.3 fL (ref 78.0–100.0)
Platelets: 177 10*3/uL (ref 150–400)
RBC: 3.71 MIL/uL — ABNORMAL LOW (ref 4.22–5.81)
RDW: 13.4 % (ref 11.5–15.5)
WBC: 16.4 10*3/uL — ABNORMAL HIGH (ref 4.0–10.5)

## 2014-06-06 LAB — TROPONIN I
TROPONIN I: 0.72 ng/mL — AB (ref <0.031)
Troponin I: 0.45 ng/mL — ABNORMAL HIGH (ref <0.031)

## 2014-06-06 LAB — GLUCOSE, CAPILLARY
GLUCOSE-CAPILLARY: 166 mg/dL — AB (ref 70–99)
Glucose-Capillary: 77 mg/dL (ref 70–99)
Glucose-Capillary: 84 mg/dL (ref 70–99)

## 2014-06-06 LAB — CK TOTAL AND CKMB (NOT AT ARMC)
CK TOTAL: 277 U/L (ref 49–397)
CK, MB: 4.7 ng/mL (ref 0.5–5.0)
CK, MB: 6.7 ng/mL — AB (ref 0.5–5.0)
Relative Index: 2.4 (ref 0.0–2.5)
Relative Index: 2.7 — ABNORMAL HIGH (ref 0.0–2.5)
Total CK: 171 U/L (ref 49–397)

## 2014-06-06 MED ORDER — SODIUM CHLORIDE 0.9 % IV SOLN: 250.0000 mL | INTRAVENOUS | Status: DC | PRN

## 2014-06-06 MED ORDER — FUROSEMIDE 40 MG PO TABS
40.0000 mg | ORAL_TABLET | Freq: Every day | ORAL | Status: DC
  Administered 2014-06-06: 40 mg via ORAL
  Filled 2014-06-06 (×2): qty 1

## 2014-06-06 MED ORDER — MOVING RIGHT ALONG BOOK
Freq: Once | Status: AC
  Administered 2014-06-06: 10:00:00
  Filled 2014-06-06: qty 1

## 2014-06-06 MED ORDER — SODIUM CHLORIDE 0.9 % IJ SOLN: 3.0000 mL | INTRAMUSCULAR | Status: DC | PRN

## 2014-06-06 MED ORDER — ATORVASTATIN CALCIUM 80 MG PO TABS
80.0000 mg | ORAL_TABLET | Freq: Every day | ORAL | Status: DC
  Administered 2014-06-06 – 2014-06-07 (×2): 80 mg via ORAL
  Filled 2014-06-06 (×3): qty 1

## 2014-06-06 MED ORDER — MORPHINE SULFATE 4 MG/ML IJ SOLN
4.0000 mg | INTRAMUSCULAR | Status: AC
  Administered 2014-06-06: 4 mg via INTRAVENOUS

## 2014-06-06 MED ORDER — POTASSIUM CHLORIDE CRYS ER 20 MEQ PO TBCR
20.0000 meq | EXTENDED_RELEASE_TABLET | Freq: Every day | ORAL | Status: DC
  Administered 2014-06-06 – 2014-06-08 (×3): 20 meq via ORAL
  Filled 2014-06-06 (×3): qty 1

## 2014-06-06 MED ORDER — SODIUM CHLORIDE 0.9 % IJ SOLN
3.0000 mL | Freq: Two times a day (BID) | INTRAMUSCULAR | Status: DC
  Administered 2014-06-06 – 2014-06-08 (×4): 3 mL via INTRAVENOUS

## 2014-06-06 MED ORDER — POTASSIUM CHLORIDE 10 MEQ/50ML IV SOLN
10.0000 meq | INTRAVENOUS | Status: AC | PRN
  Administered 2014-06-06 (×3): 10 meq via INTRAVENOUS

## 2014-06-06 MED ORDER — CARVEDILOL 3.125 MG PO TABS
3.1250 mg | ORAL_TABLET | Freq: Two times a day (BID) | ORAL | Status: DC
  Administered 2014-06-06 – 2014-06-08 (×5): 3.125 mg via ORAL
  Filled 2014-06-06 (×7): qty 1

## 2014-06-06 MED ORDER — MORPHINE SULFATE 4 MG/ML IJ SOLN: 4.0000 mg | Freq: Once | INTRAMUSCULAR | Status: DC

## 2014-06-06 NOTE — Plan of Care (Signed)
0945 morphine 4 mg IV will not scan into the computer, pharmacist called and in room Hedy Camara) overrode the barcode. Pt did receive morphine 4 mg  IV.

## 2014-06-06 NOTE — Progress Notes (Signed)
CARDIAC REHAB PHASE I   PRE:  Rate/Rhythm: 76 sinus  BP:  Supine: 100/76 Sitting:   Standing:    SaO2: 86-90% RA  MODE:  Ambulation: 550 ft   POST:  Rate/Rhythem: 90 sinus  BP:  Supine:   Sitting: 108/61  Standing:    SaO2: 81-91% RA  Pt ambulated 550 ft with assist x1.  Pt tolerated walk well with mild SOB upon return,  Pt SaO2 slightly depressed in the 80s with rest and walking but quick recovery to low 90s with deep breathing.  RN notified.  Pt encouraged to continue to walk on own and with staff over weekend.   We will f/u on Monday. Kelly Sam, MA, ACSM RCEP [phone]  Clotilde Dieter

## 2014-06-06 NOTE — Progress Notes (Signed)
Ambulated to 2W37, with monitor and SCD's. Pateint placed in bed. RN to rceive in room.

## 2014-06-06 NOTE — Progress Notes (Signed)
Report to 2 W RN 

## 2014-06-06 NOTE — Progress Notes (Signed)
      [address]       [place] [address]             [phone]        CARDIOTHORACIC SURGERY PROGRESS NOTE   R2 Days Post-Op Procedure(s) (LRB): CORONARY ARTERY BYPASS GRAFTING (CABG) times four using bilateral internal mammary arteries and EVH for left  leg saphenous vein (N/A) TRANSESOPHAGEAL ECHOCARDIOGRAM (TEE) (N/A)  Subjective: Looks good and feels well.  Nausea improved.  Mild soreness in chest.  Objective: Vital signs: BP Readings from Last 1 Encounters:  06/06/14 117/54   Pulse Readings from Last 1 Encounters:  06/06/14 66   Resp Readings from Last 1 Encounters:  06/06/14 14   Temp Readings from Last 1 Encounters:  06/06/14 98.5 F (36.9 C) Oral    Hemodynamics:    Physical Exam:  Rhythm:   sinus  Breath sounds: clear  Heart sounds:  RRR  Incisions:  Dressing dry, intact  Abdomen:  Soft, non-distended, non-tender  Extremities:  Warm, well-perfused  Chest tubes:  Low volume thin serosanguinous output, no air leak    Intake/Output from previous day: 05/06 0701 - 05/07 0700 In: 970 [P.O.:600; I.V.:220; IV Piggyback:150] Out: 2215 [Urine:1565; Chest Tube:650] Intake/Output this shift: Total I/O In: 340 [P.O.:240; IV Piggyback:100] Out: -   Lab Results:  CBC: Recent Labs  06/05/14 1650 06/05/14 1652 06/06/14 0405  WBC 15.5*  --  16.4*  HGB 11.4* 11.9* 11.7*  HCT 34.1* 35.0* 35.0*  PLT 166  --  177    BMET:  Recent Labs  06/05/14 0240  06/05/14 1652 06/06/14 0405  NA 140  --  138 136  K 3.8  --  4.1 3.4*  CL 108  --  99* 101  CO2 26  --   --  28  GLUCOSE 113*  --  135* 85  BUN 10  --  15 13  CREATININE 0.74  < > 0.60* 0.69  CALCIUM 7.7*  --   --  8.0*  < > = values in this interval not displayed.   CBG (last 3)   Recent Labs  06/06/14 0027 06/06/14 0356 06/06/14 0850  GLUCAP 77 84 166*    ABG    Component Value Date/Time   PHART 7.348* 06/04/2014 1851   PCO2ART 42.1 06/04/2014 1851   PO2ART  60.0* 06/04/2014 1851   HCO3 23.1 06/04/2014 1851   TCO2 23 06/05/2014 1652   ACIDBASEDEF 2.0 06/04/2014 1851   O2SAT 89.0 06/04/2014 1851    CXR: PORTABLE CHEST - 1 VIEW  COMPARISON: 06/05/2014  FINDINGS: The Swan-Ganz catheter is been removed. The right jugular sheath remains. Bilateral chest tubes and mediastinal drain remain. There is no pneumothorax. There is mild atelectatic appearing basilar opacity bilaterally with partial clearance compared to the previous day.  IMPRESSION: Swan-Ganz catheter removal. Improved, with mild residual atelectatic opacities in both bases. No pneumothorax.   Electronically Signed  By: Andreas Newport M.D.  On: 06/06/2014 07:02  Assessment/Plan: S/P Procedure(s) (LRB): CORONARY ARTERY BYPASS GRAFTING (CABG) times four using bilateral internal mammary arteries and EVH for left  leg saphenous vein (N/A) TRANSESOPHAGEAL ECHOCARDIOGRAM (TEE) (N/A)  Doing well POD2 Maintaining NSR w/ stable BP Expected post op acute blood loss anemia, mild, stable Expected post op volume excess, mild, diuresing some Expected post op atelectasis, mild   Mobilize  D/C tubes  Diuresis  Routine care  Transfer 2W   Rexene Alberts 06/06/2014 9:20 AM

## 2014-06-07 ENCOUNTER — Inpatient Hospital Stay (HOSPITAL_COMMUNITY): Payer: BLUE CROSS/BLUE SHIELD

## 2014-06-07 LAB — CBC
HCT: 32.4 % — ABNORMAL LOW (ref 39.0–52.0)
Hemoglobin: 10.8 g/dL — ABNORMAL LOW (ref 13.0–17.0)
MCH: 31.3 pg (ref 26.0–34.0)
MCHC: 33.3 g/dL (ref 30.0–36.0)
MCV: 93.9 fL (ref 78.0–100.0)
Platelets: 190 10*3/uL (ref 150–400)
RBC: 3.45 MIL/uL — ABNORMAL LOW (ref 4.22–5.81)
RDW: 13.3 % (ref 11.5–15.5)
WBC: 16.2 10*3/uL — AB (ref 4.0–10.5)

## 2014-06-07 LAB — CK TOTAL AND CKMB (NOT AT ARMC)
CK, MB: 6.1 ng/mL — ABNORMAL HIGH (ref 0.5–5.0)
RELATIVE INDEX: 5.5 — AB (ref 0.0–2.5)
Total CK: 111 U/L (ref 49–397)

## 2014-06-07 LAB — BASIC METABOLIC PANEL
Anion gap: 8 (ref 5–15)
BUN: 17 mg/dL (ref 6–20)
CALCIUM: 8.2 mg/dL — AB (ref 8.9–10.3)
CO2: 28 mmol/L (ref 22–32)
Chloride: 100 mmol/L — ABNORMAL LOW (ref 101–111)
Creatinine, Ser: 0.78 mg/dL (ref 0.61–1.24)
GFR calc Af Amer: >60 mL/min (ref >60)
GFR calc non Af Amer: >60 mL/min (ref >60)
GLUCOSE: 102 mg/dL — AB (ref 70–99)
POTASSIUM: 4 mmol/L (ref 3.5–5.1)
SODIUM: 136 mmol/L (ref 135–145)

## 2014-06-07 LAB — TROPONIN I: TROPONIN I: 0.32 ng/mL — AB (ref <0.031)

## 2014-06-07 MED ORDER — FUROSEMIDE 40 MG PO TABS: 40.0000 mg | ORAL_TABLET | Freq: Every day | ORAL | Status: DC

## 2014-06-07 MED ORDER — FUROSEMIDE 10 MG/ML IJ SOLN: 40.0000 mg | Freq: Once | INTRAMUSCULAR | Status: DC

## 2014-06-07 MED ORDER — FUROSEMIDE 40 MG PO TABS
40.0000 mg | ORAL_TABLET | Freq: Every day | ORAL | Status: DC
  Administered 2014-06-07 – 2014-06-08 (×2): 40 mg via ORAL
  Filled 2014-06-07 (×2): qty 1

## 2014-06-07 NOTE — Progress Notes (Signed)
Pacing wires removed without complication. Pt on bedrest x 1hr. Susie Cassette RN

## 2014-06-07 NOTE — Progress Notes (Addendum)
      [address]       [place] [address]             [phone]        3 Days Post-Op Procedure(s) (LRB): CORONARY ARTERY BYPASS GRAFTING (CABG) times four using bilateral internal mammary arteries and EVH for left  leg saphenous vein (N/A) TRANSESOPHAGEAL ECHOCARDIOGRAM (TEE) (N/A)  Subjective: Patient with loose stools  Objective: Vital signs in last 24 hours: Temp:  [98.6 F (37 C)-98.9 F (37.2 C)] 98.8 F (37.1 C) (05/08 0439) Pulse Rate:  [67-96] 96 (05/08 0439) Cardiac Rhythm:  [-] Normal sinus rhythm (05/08 0800) Resp:  [18-19] 18 (05/08 0439) BP: (95-117)/(53-93) 117/66 mmHg (05/08 0439) SpO2:  [88 %-95 %] 90 % (05/08 0439) Weight:  [162 lb 7.7 oz (73.7 kg)] 162 lb 7.7 oz (73.7 kg) (05/08 0439)  Pre op weight 73 kg Current Weight  06/07/14 162 lb 7.7 oz (73.7 kg)        Intake/Output from previous day: 05/07 0701 - 05/08 0700 In: 460 [P.O.:360; IV Piggyback:100] Out: 150 [Urine:150]   Physical Exam:  Cardiovascular: RRR, no murmurs, gallops, or rubs. Pulmonary: Slightly diminished at bases; no rales, wheezes, or rhonchi. Abdomen: Soft, non tender, bowel sounds present. Extremities: Mild bilateral lower extremity edema. Wounds: Clean and dry.  No erythema or signs of infection.  Lab Results: CBC: Recent Labs  06/06/14 0405 06/07/14 0455  WBC 16.4* 16.2*  HGB 11.7* 10.8*  HCT 35.0* 32.4*  PLT 177 190   BMET:  Recent Labs  06/06/14 0405 06/07/14 0455  NA 136 136  K 3.4* 4.0  CL 101 100*  CO2 28 28  GLUCOSE 85 102*  BUN 13 17  CREATININE 0.69 0.78  CALCIUM 8.0* 8.2*    PT/INR:  Lab Results  Component Value Date   INR 1.40 06/04/2014   INR 0.99 06/02/2014   INR 0.97 06/01/2014   ABG:  INR: Will add last result for INR, ABG once components are confirmed Will add last 4 CBG results once components are confirmed  Assessment/Plan:  1. CV - SR in the 90's. On Coreg 3.125 mg bid 2.  Pulmonary - On room air.  CXR this am shows no pneumothorax, interstitial edema. Encourage incentive spriometer 3. Volume Overload - On Lasix 40 mg daily.  4.  Acute blood loss anemia - H and H 10.8 and 32.4 5. Remove EPW in am 6. Stop stool softeners 7. Possible discharge in am  ZIMMERMAN,DONIELLE MPA-C 06/07/2014,9:35 AM  I have seen and examined the patient and agree with the assessment and plan as outlined.  Making good progress.  Possible d/c home 1-2 days.  Rexene Alberts 06/07/2014 11:44 AM

## 2014-06-08 ENCOUNTER — Encounter (HOSPITAL_COMMUNITY): Payer: Self-pay | Admitting: Surgery

## 2014-06-08 LAB — BASIC METABOLIC PANEL
Anion gap: 7 (ref 5–15)
BUN: 12 mg/dL (ref 6–20)
CO2: 28 mmol/L (ref 22–32)
Calcium: 8.3 mg/dL — ABNORMAL LOW (ref 8.9–10.3)
Chloride: 98 mmol/L — ABNORMAL LOW (ref 101–111)
Creatinine, Ser: 0.79 mg/dL (ref 0.61–1.24)
GFR calc non Af Amer: >60 mL/min (ref >60)
GLUCOSE: 173 mg/dL — AB (ref 70–99)
POTASSIUM: 3.3 mmol/L — AB (ref 3.5–5.1)
SODIUM: 133 mmol/L — AB (ref 135–145)

## 2014-06-08 LAB — TROPONIN I: Troponin I: 0.23 ng/mL — ABNORMAL HIGH (ref <0.031)

## 2014-06-08 LAB — CK TOTAL AND CKMB (NOT AT ARMC)
CK, MB: 2.3 ng/mL (ref 0.5–5.0)
RELATIVE INDEX: INVALID (ref 0.0–2.5)
Total CK: 79 U/L (ref 49–397)

## 2014-06-08 MED ORDER — ASPIRIN 325 MG PO TBEC: 325.0000 mg | DELAYED_RELEASE_TABLET | Freq: Every day | ORAL | Status: DC

## 2014-06-08 MED ORDER — OXYCODONE HCL 5 MG PO TABS: 5.0000 mg | ORAL_TABLET | Freq: Four times a day (QID) | ORAL | Status: DC | PRN

## 2014-06-08 MED ORDER — ATORVASTATIN CALCIUM 80 MG PO TABS: 80.0000 mg | ORAL_TABLET | Freq: Every day | ORAL | Status: DC

## 2014-06-08 MED ORDER — LOSARTAN POTASSIUM 25 MG PO TABS: 12.5000 mg | ORAL_TABLET | Freq: Every day | ORAL | Status: DC

## 2014-06-08 MED FILL — Heparin Sodium (Porcine) Inj 1000 Unit/ML: INTRAMUSCULAR | Qty: 30 | Status: AC

## 2014-06-08 MED FILL — Potassium Chloride Inj 2 mEq/ML: INTRAVENOUS | Qty: 40 | Status: AC

## 2014-06-08 MED FILL — Magnesium Sulfate Inj 50%: INTRAMUSCULAR | Qty: 10 | Status: AC

## 2014-06-08 NOTE — Progress Notes (Signed)
[address]       [place] [address]             [phone]      4 Days Post-Op Procedure(s) (LRB): CORONARY ARTERY BYPASS GRAFTING (CABG) times four using bilateral internal mammary arteries and EVH for left  leg saphenous vein (N/A) TRANSESOPHAGEAL ECHOCARDIOGRAM (TEE) (N/A) Subjective: Looks and feels well  Objective: Vital signs in last 24 hours: Temp:  [98.7 F (37.1 C)-99.5 F (37.5 C)] 98.7 F (37.1 C) (05/09 0443) Pulse Rate:  [53-86] 86 (05/09 0443) Cardiac Rhythm:  [-] Normal sinus rhythm (05/08 2110) Resp:  [16-18] 16 (05/09 0443) BP: (108-120)/(52-62) 108/62 mmHg (05/09 0443) SpO2:  [94 %-95 %] 94 % (05/09 0443) Weight:  [158 lb 4.6 oz (71.8 kg)] 158 lb 4.6 oz (71.8 kg) (05/09 0443)  Hemodynamic parameters for last 24 hours:    Intake/Output from previous day: 05/08 0701 - 05/09 0700 In: 720 [P.O.:720] Out: -  Intake/Output this shift:    General appearance: alert, cooperative and no distress Heart: regular rate and rhythm Lungs: clear to auscultation bilaterally Abdomen: benign Extremities: no edema Wound: incis healing well  Lab Results:  Recent Labs  06/06/14 0405 06/07/14 0455  WBC 16.4* 16.2*  HGB 11.7* 10.8*  HCT 35.0* 32.4*  PLT 177 190   BMET:  Recent Labs  06/06/14 0405 06/07/14 0455  NA 136 136  K 3.4* 4.0  CL 101 100*  CO2 28 28  GLUCOSE 85 102*  BUN 13 17  CREATININE 0.69 0.78  CALCIUM 8.0* 8.2*    PT/INR: No results for input(s): LABPROT, INR in the last 72 hours. ABG    Component Value Date/Time   PHART 7.348* 06/04/2014 1851   HCO3 23.1 06/04/2014 1851   TCO2 23 06/05/2014 1652   ACIDBASEDEF 2.0 06/04/2014 1851   O2SAT 89.0 06/04/2014 1851   CBG (last 3)   Recent Labs  06/06/14 0027 06/06/14 0356 06/06/14 0850  GLUCAP 77 84 166*    Meds Scheduled Meds: . acetaminophen  1,000 mg Oral 4 times per day  . aspirin EC  325 mg Oral Daily  . atorvastatin  80 mg Oral q1800  .  carvedilol  3.125 mg Oral BID WC  . furosemide  40 mg Oral Daily  . pantoprazole  40 mg Oral Daily  . potassium chloride  20 mEq Oral Daily  . sodium chloride  3 mL Intravenous Q12H  . sodium chloride  3 mL Intravenous Q12H   Continuous Infusions:  PRN Meds:.sodium chloride, metoprolol, ondansetron (ZOFRAN) IV, oxyCODONE, sodium chloride, sodium chloride, traMADol  Xrays Dg Chest 2 View  06/07/2014   CLINICAL DATA:  SOB since having a CABG 3 days ago; ex-smoker  EXAM: CHEST - 2 VIEW  COMPARISON:  the previous day's study  FINDINGS: Interval removal of bilateral chest tubes ; no pneumothorax evident. Small bilateral pleural effusions persist. Changes of CABG. Heart size upper limits normal. Mild interstitial edema or infiltrates involving bases more than apices, slightly more conspicuous than on previous portable exam.  IMPRESSION: 1. Bilateral chest tube removal with no pneumothorax. 2. Mild bibasilar interstitial edema, slightly increased since previous exam   Electronically Signed   By: Lucrezia Europe M.D.   On: 06/07/2014 08:39    Assessment/Plan: S/P Procedure(s) (LRB): CORONARY ARTERY BYPASS GRAFTING (CABG) times four using bilateral internal mammary arteries and EVH for left  leg saphenous vein (N/A) TRANSESOPHAGEAL ECHOCARDIOGRAM (TEE) (N/A) Plan for discharge: see  discharge orders   LOS: 6 days    Karstyn Birkey E 06/08/2014

## 2014-06-08 NOTE — Discharge Instructions (Signed)
Radial Site Care Refer to this sheet in the next few weeks. These instructions provide you with information on caring for yourself after your procedure. Your caregiver may also give you more specific instructions. Your treatment has been planned according to current medical practices, but problems sometimes occur. Call your caregiver if you have any problems or questions after your procedure. HOME CARE INSTRUCTIONS  You may shower the day after the procedure.Remove the bandage (dressing) and gently wash the site with plain soap and water.Gently pat the site dry.  Do not apply powder or lotion to the site.  Do not submerge the affected site in water for 3 to 5 days.  Inspect the site at least twice daily.  Do not flex or bend the affected arm for 24 hours.  No lifting over 5 pounds (2.3 kg) for 5 days after your procedure.  Do not drive home if you are discharged the same day of the procedure. Have someone else drive you.  You may drive 24 hours after the procedure unless otherwise instructed by your caregiver.  Do not operate machinery or power tools for 24 hours.  A responsible adult should be with you for the first 24 hours after you arrive home. What to expect:  Any bruising will usually fade within 1 to 2 weeks.  Blood that collects in the tissue (hematoma) may be painful to the touch. It should usually decrease in size and tenderness within 1 to 2 weeks. SEEK IMMEDIATE MEDICAL CARE IF:  You have unusual pain at the radial site.  You have redness, warmth, swelling, or pain at the radial site.  You have drainage (other than a small amount of blood on the dressing).  You have chills.  You have a fever or persistent symptoms for more than 72 hours.  You have a fever and your symptoms suddenly get worse.  Your arm becomes pale, cool, tingly, or numb.  You have heavy bleeding from the site. Hold pressure on the site. Document Released: 02/18/2010 Document Revised:  04/10/2011 Document Reviewed: 02/18/2010 [facility] Patient Information 2015 [place], [place]. This information is not intended to replace advice given to you by your health care provider. Make sure you discuss any questions you have with your health care provider.  Activity: 1.May walk up steps                2.No lifting more than ten pounds for four weeks.                 3.No driving for four weeks.                4.Stop any activity that causes chest pain, shortness of breath, dizziness, sweating or excessive weakness.                5.Avoid straining.                6.Continue with your breathing exercises daily.  Diet: Low fat, Low salt and low sugar diet  Wound Care: May shower.  Clean wounds with mild soap and water daily. Contact the office at [phone] if any problems arise.  Coronary Artery Bypass Grafting, Care After Refer to this sheet in the next few weeks. These instructions provide you with information on caring for yourself after your procedure. Your health care provider may also give you more specific instructions. Your treatment has been planned according to current medical practices, but problems sometimes occur. Call your health care provider if you  have any problems or questions after your procedure. WHAT TO EXPECT AFTER THE PROCEDURE Recovery from surgery will be different for everyone. Some people feel well after 3 or 4 weeks, while for others it takes longer. After your procedure, it is typical to have the following:  Nausea and a lack of appetite.   Constipation.  Weakness and fatigue.   Depression or irritability.   Pain or discomfort at your incision site. HOME CARE INSTRUCTIONS  Take medicines only as directed by your health care provider. Do not stop taking medicines or start any new medicines without first checking with your health care provider.  Take your pulse as directed by your health care provider.  Perform deep breathing as directed by your  health care provider. If you were given a device called an incentive spirometer, use it to practice deep breathing several times a day. Support your chest with a pillow or your arms when you take deep breaths or cough.  Keep incision areas clean, dry, and protected. Remove or change any bandages (dressings) only as directed by your health care provider. You may have skin adhesive strips over the incision areas. Do not take the strips off. They will fall off on their own.  Check incision areas daily for any swelling, redness, or drainage.  If incisions were made in your legs, do the following:  Avoid crossing your legs.   Avoid sitting for long periods of time. Change positions every 30 minutes.   Elevate your legs when you are sitting.  Wear compression stockings as directed by your health care provider. These stockings help keep blood clots from forming in your legs.  Take showers once your health care provider approves. Until then, only take sponge baths. Pat incisions dry. Do not rub incisions with a washcloth or towel. Do not take baths, swim, or use a hot tub until your health care provider approves.  Eat foods that are high in fiber, such as raw fruits and vegetables, whole grains, beans, and nuts. Meats should be lean cut. Avoid canned, processed, and fried foods.  Drink enough fluid to keep your urine clear or pale yellow.  Weigh yourself every day. This helps identify if you are retaining fluid that may make your heart and lungs work harder.  Rest and limit activity as directed by your health care provider. You may be instructed to:  Stop any activity at once if you have chest pain, shortness of breath, irregular heartbeats, or dizziness. Get help right away if you have any of these symptoms.  Move around frequently for short periods or take short walks as directed by your health care provider. Increase your activities gradually. You may need physical therapy or cardiac  rehabilitation to help strengthen your muscles and build your endurance.  Avoid lifting, pushing, or pulling anything heavier than 10 lb (4.5 kg) for at least 6 weeks after surgery.  Do not drive until your health care provider approves.  Ask your health care provider when you may return to work.  Ask your health care provider when you may resume sexual activity.  Keep all follow-up visits as directed by your health care provider. This is important. SEEK MEDICAL CARE IF:  You have swelling, redness, increasing pain, or drainage at the site of an incision.  You have a fever.  You have swelling in your ankles or legs.  You have pain in your legs.   You gain 2 or more pounds (0.9 kg) a day.  You are  nauseous or vomit.  You have diarrhea. SEEK IMMEDIATE MEDICAL CARE IF:  You have chest pain that goes to your jaw or arms.  You have shortness of breath.   You have a fast or irregular heartbeat.   You notice a "clicking" in your breastbone (sternum) when you move.   You have numbness or weakness in your arms or legs.  You feel dizzy or light-headed.  MAKE SURE YOU:  Understand these instructions.  Will watch your condition.  Will get help right away if you are not doing well or get worse. Document Released: 08/05/2004 Document Revised: 06/02/2013 Document Reviewed: 06/25/2012 [facility] Patient Information 2015 [place], [place]. This information is not intended to replace advice given to you by your health care provider. Make sure you discuss any questions you have with your health care provider. Endoscopic Saphenous Vein Harvesting Care After Refer to this sheet in the next few weeks. These instructions provide you with information on caring for yourself after your procedure. Your health care provider may also give you more specific instructions. Your treatment has been planned according to current medical practices, but problems sometimes occur. Call your health care  provider if you have any problems or questions after your procedure. HOME CARE INSTRUCTIONS Medicine Take whatever pain medicine your surgeon prescribes. Follow the directions carefully. Do not take over-the-counter pain medicine unless your surgeon says it is okay. Some pain medicine can cause bleeding problems for several weeks after surgery. Follow your surgeon's instructions about driving. You will probably not be permitted to drive after heart surgery. Take any medicines your surgeon prescribes. Any medicines you took before your heart surgery should be checked with your health care provider before you start taking them again. Wound care If your surgeon has prescribed an elastic bandage or stocking, ask how long you should wear it. Check the area around your surgical cuts (incisions) whenever your bandages (dressings) are changed. Look for any redness or swelling. You will need to return to have the stitches (sutures) or staples taken out. Ask your surgeon when to do that. Ask your surgeon when you can shower or bathe. Activity Try to keep your legs raised when you are sitting. Do any exercises your health care providers have given you. These may include deep breathing exercises, coughing, walking, or other exercises. SEEK MEDICAL CARE IF: You have any questions about your medicines. You have more leg pain, especially if your pain medicine stops working. New or growing bruises develop on your leg. Your leg swells, feels tight, or becomes red. You have numbness in your leg. SEEK IMMEDIATE MEDICAL CARE IF: Your pain gets much worse. Blood or fluid leaks from any of the incisions. Your incisions become warm, swollen, or red. You have chest pain. You have trouble breathing. You have a fever. You have more pain near your leg incision. MAKE SURE YOU: Understand these instructions. Will watch your condition. Will get help right away if you are not doing well or get worse. Document  Released: 09/28/2010 Document Revised: 01/21/2013 Document Reviewed: 09/28/2010 [facility] Patient Information 2015 [place], [place]. This information is not intended to replace advice given to you by your health care provider. Make sure you discuss any questions you have with your health care provider.

## 2014-06-08 NOTE — Progress Notes (Signed)
06/08/2014 11:43 AM Discharge AVS meds taken today and those due this evening reviewed.  Follow-up appointments and when to call md reviewed.  Post CABG instructions given.  D/C IV and TELE.  Questions and concerns addressed.   D/C home per orders. [place]

## 2014-06-08 NOTE — Progress Notes (Signed)
[id number] Pt stated walked yesterday and sats at about 95% RA. For discharge today. Education completed with pt and wife who voiced understanding. Have watched post op video. Reviewed heart healthy diet. Gave smoking cessation handout and pt stated has quit. Discussed CRP 2 and will refer to [place]. Graylon Good RN BSN 06/08/2014 9:34 AM

## 2014-06-11 ENCOUNTER — Other Ambulatory Visit: Payer: Self-pay | Admitting: *Deleted

## 2014-06-11 ENCOUNTER — Encounter (INDEPENDENT_AMBULATORY_CARE_PROVIDER_SITE_OTHER): Payer: Self-pay

## 2014-06-11 DIAGNOSIS — I2583 Coronary atherosclerosis due to lipid rich plaque: Principal | ICD-10-CM

## 2014-06-11 DIAGNOSIS — Z951 Presence of aortocoronary bypass graft: Secondary | ICD-10-CM

## 2014-06-11 DIAGNOSIS — I2510 Atherosclerotic heart disease of native coronary artery without angina pectoris: Secondary | ICD-10-CM

## 2014-06-11 MED ORDER — ATORVASTATIN CALCIUM 80 MG PO TABS: 80.0000 mg | ORAL_TABLET | Freq: Every day | ORAL | Status: DC

## 2014-06-18 ENCOUNTER — Telehealth: Payer: Self-pay | Admitting: *Deleted

## 2014-06-18 NOTE — Telephone Encounter (Signed)
Late entry  faxed on 06/17/14 Signed cardiac rehab phase ll ordered

## 2014-06-19 ENCOUNTER — Telehealth (HOSPITAL_COMMUNITY): Payer: Self-pay | Admitting: Cardiac Rehabilitation

## 2014-06-19 ENCOUNTER — Telehealth: Payer: Self-pay | Admitting: Cardiology

## 2014-06-19 MED ORDER — CARVEDILOL 3.125 MG PO TABS: 6.2500 mg | ORAL_TABLET | Freq: Two times a day (BID) | ORAL | Status: DC

## 2014-06-19 NOTE — Telephone Encounter (Signed)
Pt's wife called in wanting to get some clarity on his prescription of Carvedilol. His original prescription stated that he take 1 tab po BID but while he was in the hospital his dosage was changed to 2 tabs po BID. She would like to know which dosage is correct so a new prescription can be call in to his pharmacy. Please f/u   Thanks

## 2014-06-19 NOTE — Telephone Encounter (Signed)
Recent dose from hospital discharge reviewed with patient. States he needs refills - this was sent to his preferred pharmacy. Understanding verbalized.

## 2014-06-19 NOTE — Telephone Encounter (Signed)
-----   Message from Gaye Pollack, MD sent at 06/19/2014  9:05 AM EDT ----- Regarding: RE: Cardiac Rehab Yes, he can start anytime he feels up to it. ----- Message -----    From: Lowell Guitar, RN    Sent: 06/17/2014   5:16 PM      To: Gaye Pollack, MD Subject: Cardiac Rehab                                  Dear Dr. Cyndia Bent,  Pt had CABG 06/04/2014.  He is eager to begin cardiac rehab however his hospital f/u appt is 07/15/14.  Would it be possible for him to begin cardiac rehab before that office appt?  Thank you, Andi Hence, RN, BSN Cardiac Pulmonary Rehab

## 2014-07-06 ENCOUNTER — Telehealth: Payer: Self-pay | Admitting: Cardiology

## 2014-07-06 ENCOUNTER — Telehealth: Payer: Self-pay | Admitting: *Deleted

## 2014-07-06 NOTE — Telephone Encounter (Signed)
Called Kelly Olson for his 30 day follow-up for the LEVO-CTS trial. He states he is doing well and he has not had any adverse events.

## 2014-07-07 ENCOUNTER — Encounter (HOSPITAL_COMMUNITY): Payer: Self-pay | Admitting: Emergency Medicine

## 2014-07-07 ENCOUNTER — Emergency Department (HOSPITAL_COMMUNITY): Payer: BLUE CROSS/BLUE SHIELD

## 2014-07-07 ENCOUNTER — Other Ambulatory Visit: Payer: Self-pay | Admitting: Cardiology

## 2014-07-07 DIAGNOSIS — R079 Chest pain, unspecified: Secondary | ICD-10-CM | POA: Diagnosis present

## 2014-07-07 DIAGNOSIS — Z79899 Other long term (current) drug therapy: Secondary | ICD-10-CM | POA: Diagnosis not present

## 2014-07-07 DIAGNOSIS — Z7982 Long term (current) use of aspirin: Secondary | ICD-10-CM | POA: Insufficient documentation

## 2014-07-07 DIAGNOSIS — Z87891 Personal history of nicotine dependence: Secondary | ICD-10-CM | POA: Insufficient documentation

## 2014-07-07 DIAGNOSIS — R0789 Other chest pain: Secondary | ICD-10-CM | POA: Diagnosis not present

## 2014-07-07 DIAGNOSIS — I2510 Atherosclerotic heart disease of native coronary artery without angina pectoris: Secondary | ICD-10-CM | POA: Diagnosis not present

## 2014-07-07 DIAGNOSIS — E785 Hyperlipidemia, unspecified: Secondary | ICD-10-CM | POA: Diagnosis not present

## 2014-07-07 LAB — CBC
HCT: 42.6 % (ref 39.0–52.0)
Hemoglobin: 14.2 g/dL (ref 13.0–17.0)
MCH: 30.6 pg (ref 26.0–34.0)
MCHC: 33.3 g/dL (ref 30.0–36.0)
MCV: 91.8 fL (ref 78.0–100.0)
Platelets: 270 10*3/uL (ref 150–400)
RBC: 4.64 MIL/uL (ref 4.22–5.81)
RDW: 13.1 % (ref 11.5–15.5)
WBC: 16.7 10*3/uL — AB (ref 4.0–10.5)

## 2014-07-07 LAB — I-STAT TROPONIN, ED: Troponin i, poc: 0.03 ng/mL (ref 0.00–0.08)

## 2014-07-07 NOTE — Telephone Encounter (Signed)
Manuela Schwartz, pt's case manager from [employer], called back.   Notification only - pt was enrolled in their member services case manager program.  I verbalized acknowledgement.

## 2014-07-07 NOTE — Telephone Encounter (Signed)
Left message for caller to return phone call.

## 2014-07-07 NOTE — ED Notes (Signed)
Pt. reports right upper chest pain " soreness" worse with deep inspiration onset this evening , pt. stated he tried to reach for the remote control on the floor at home this evening when the pain started , denies SOB , nausea or diaphoresis , pt. also stated that he sat on his office for 12 hours today with minimal activity.

## 2014-07-07 NOTE — Telephone Encounter (Signed)
Rx(s) sent to pharmacy electronically.  

## 2014-07-08 ENCOUNTER — Emergency Department (HOSPITAL_COMMUNITY)
Admission: EM | Admit: 2014-07-08 | Discharge: 2014-07-08 | Disposition: A | Discharge status: Home / Self Care | Payer: BLUE CROSS/BLUE SHIELD | Attending: Emergency Medicine | Admitting: Emergency Medicine

## 2014-07-08 DIAGNOSIS — R0789 Other chest pain: Secondary | ICD-10-CM

## 2014-07-08 LAB — I-STAT TROPONIN, ED: Troponin i, poc: 0.03 ng/mL (ref 0.00–0.08)

## 2014-07-08 LAB — BASIC METABOLIC PANEL
ANION GAP: 11 (ref 5–15)
BUN: 18 mg/dL (ref 6–20)
CHLORIDE: 100 mmol/L — AB (ref 101–111)
CO2: 23 mmol/L (ref 22–32)
CREATININE: 0.87 mg/dL (ref 0.61–1.24)
Calcium: 9 mg/dL (ref 8.9–10.3)
GFR calc non Af Amer: >60 mL/min (ref >60)
Glucose, Bld: 125 mg/dL — ABNORMAL HIGH (ref 65–99)
POTASSIUM: 4.2 mmol/L (ref 3.5–5.1)
Sodium: 134 mmol/L — ABNORMAL LOW (ref 135–145)

## 2014-07-08 NOTE — ED Notes (Signed)
Scheduled downtime, pt is in room A5.

## 2014-07-08 NOTE — Discharge Instructions (Signed)

## 2014-07-08 NOTE — ED Provider Notes (Signed)
CSN: [id number]     Arrival date & time 07/07/14  2247 History   This chart was scribed for Debby Freiberg, MD by Chester Holstein, ED Scribe. This patient was seen in room A05C/A05C and the patient's care was started at 2:19 AM.     Chief Complaint  Patient presents with  . Chest Pain     The history is provided by the patient. No language interpreter was used.   HPI Comments: Arnell Slivinski. is a 64 y.o. male with PMHx of CAD and HLDwho presents to the Emergency Department complaining of 2/10 intermittent chest pain with onset around 9 PM. Pt states he reached down to pick up an item at onset. He states he felt a catch as he came up. Pt notes movement and deep inspiration aggravates pain which he reports feels like a pulled muscle. He notes decreased physical activity today. Pt is s/p heart catheterization and CABG x4 on 06/04/14.  Pt denies SOB, diaphoresis, nausea, and vomiting.   Past Medical History  Diagnosis Date  . Coronary artery disease   . Hyperlipemia    Past Surgical History  Procedure Laterality Date  . Tonsillectomy  1957  . Cardiac catheterization  06/02/2014  . Cardiac catheterization N/A 06/02/2014    Procedure: Left Heart Cath and Coronary Angiography;  Surgeon: Leonie Man, MD;  Location: [facility] INVASIVE CV LAB CUPID;  Service: Cardiovascular;  Laterality: N/A;  . Coronary artery bypass graft N/A 06/04/2014    Procedure: CORONARY ARTERY BYPASS GRAFTING (CABG) times four using bilateral internal mammary arteries and EVH for left  leg saphenous vein;  Surgeon: Gaye Pollack, MD;  Location: [place];  Service: Open Heart Surgery;  Laterality: N/A;  . Tee without cardioversion N/A 06/04/2014    Procedure: TRANSESOPHAGEAL ECHOCARDIOGRAM (TEE);  Surgeon: Gaye Pollack, MD;  Location: [place];  Service: Open Heart Surgery;  Laterality: N/A;   No family history on file. History  Substance Use Topics  . Smoking status: Former Smoker -- 0.80 packs/day for 46 years    Types:  Cigarettes    Quit date: 05/29/2014  . Smokeless tobacco: Never Used  . Alcohol Use: 12.6 - 16.8 oz/week    7-14 Standard drinks or equivalent, 7 Glasses of wine, 7 Cans of beer per week    Review of Systems  Constitutional: Negative for diaphoresis.  Respiratory: Negative for shortness of breath.   Cardiovascular: Positive for chest pain.  Gastrointestinal: Negative for nausea and vomiting.  Musculoskeletal: Positive for myalgias.  All other systems reviewed and are negative.     Allergies  Review of patient's allergies indicates no known allergies.  Home Medications   Prior to Admission medications   Medication Sig Start Date End Date Taking? Authorizing Provider  aspirin EC 325 MG tablet TAKE 1 TABLET BY MOUTH DAILY 07/07/14  Yes Leonie Man, MD  atorvastatin (LIPITOR) 80 MG tablet Take 1 tablet (80 mg total) by mouth daily at 6 PM. 06/11/14  Yes Wayne E Gold, PA-C  carvedilol (COREG) 3.125 MG tablet Take 2 tablets (6.25 mg total) by mouth 2 (two) times daily. 06/19/14  Yes Leonie Man, MD  losartan (COZAAR) 25 MG tablet Take 0.5 tablets (12.5 mg total) by mouth daily. Patient taking differently: Take 25 mg by mouth daily.  06/08/14  Yes Wayne E Gold, PA-C  oxyCODONE (OXY IR/ROXICODONE) 5 MG immediate release tablet Take 1-2 tablets (5-10 mg total) by mouth every 6 (six) hours as needed for severe  pain. 06/08/14  Yes Wayne E Gold, PA-C   BP 139/83 mmHg  Pulse 82  Temp(Src) 99.6 F (37.6 C) (Oral)  Resp 20  Ht '5\' 11"'$  (1.803 m)  Wt 149 lb (67.586 kg)  BMI 20.79 kg/m2  SpO2 99% Physical Exam  Constitutional: He is oriented to person, place, and time. He appears well-developed and well-nourished.  HENT:  Head: Normocephalic and atraumatic.  Eyes: Conjunctivae and EOM are normal.  Neck: Normal range of motion. Neck supple.  Cardiovascular: Normal rate, regular rhythm and normal heart sounds.   Pulmonary/Chest: Effort normal and breath sounds normal. No respiratory  distress. He exhibits tenderness.  Abdominal: He exhibits no distension. There is no tenderness. There is no rebound and no guarding.  Musculoskeletal: Normal range of motion.  Neurological: He is alert and oriented to person, place, and time.  Skin: Skin is warm and dry.  Vitals reviewed.   ED Course  Procedures (including critical care time) DIAGNOSTIC STUDIES: Oxygen Saturation is 99% on room air, normal by my interpretation.    COORDINATION OF CARE: 2:23 AM Discussed treatment plan with patient at beside, the patient agrees with the plan and has no further questions at this time.   Labs Review Labs Reviewed  CBC - Abnormal; Notable for the following:    WBC 16.7 (*)    All other components within normal limits  BASIC METABOLIC PANEL - Abnormal; Notable for the following:    Sodium 134 (*)    Chloride 100 (*)    Glucose, Bld 125 (*)    All other components within normal limits  I-STAT TROPOININ, ED  Randolm Idol, ED    Imaging Review Dg Chest 2 View  07/08/2014   CLINICAL DATA:  Right upper chest pain. Onset when stretching to reach the remote.  EXAM: CHEST  2 VIEW  COMPARISON:  06/07/2014  FINDINGS: There is sternotomy and CABG. The lungs are clear except for mild chronic interstitial coarsening and superimposed bilateral nipple shadows. There are no pleural effusions. There is no pneumothorax. Pulmonary vasculature is normal. No significant musculoskeletal abnormality is evident.  IMPRESSION: No active cardiopulmonary disease.   Electronically Signed   By: Andreas Newport M.D.   On: 07/08/2014 00:45     EKG Interpretation None      MDM   Final diagnoses:  Chest wall pain    63 y.o. male with pertinent PMH of CAD sp recent CABG presents with chest wall pain after reaching for the remote.  No concerning exam elements or history. Wu as above unremarkable.  Likely muscle strain.  DC home in stable condition.    I have reviewed all laboratory and imaging studies  if ordered as above  1. Chest wall pain           Debby Freiberg, MD 07/08/14 [phone]

## 2014-07-09 ENCOUNTER — Encounter (HOSPITAL_COMMUNITY)
Admission: RE | Admit: 2014-07-09 | Discharge: 2014-07-09 | Disposition: A | Discharge status: Home / Self Care | Payer: BLUE CROSS/BLUE SHIELD | Source: Ambulatory Visit | Attending: Cardiology | Admitting: Cardiology

## 2014-07-09 DIAGNOSIS — Z48812 Encounter for surgical aftercare following surgery on the circulatory system: Secondary | ICD-10-CM | POA: Insufficient documentation

## 2014-07-09 DIAGNOSIS — Z951 Presence of aortocoronary bypass graft: Secondary | ICD-10-CM | POA: Insufficient documentation

## 2014-07-09 NOTE — Progress Notes (Signed)
Cardiac Rehab Medication Review by a Pharmacist  Does the patient  feel that his/her medications are working for him/her?  yes  Has the patient been experiencing any side effects to the medications prescribed?  no  Does the patient measure his/her own blood pressure or blood glucose at home?  Yes; He reports that when he checks his blood pressure, it runs ~ 120/85-90.  Does the patient have any problems obtaining medications due to transportation or finances?   no  Understanding of regimen: excellent Understanding of indications: excellent Potential of compliance: excellent    Pharmacist comments: Patient has a good understanding of his medication regimen. He does not report experiencing any side effects.  His blood pressure appears to be within goal when measuring.  Kelly Olson, PharmD Clinical Pharmacist - Resident Pager: [phone] 6/9/20168:25 AM

## 2014-07-13 ENCOUNTER — Other Ambulatory Visit: Payer: Self-pay

## 2014-07-13 ENCOUNTER — Ambulatory Visit (INDEPENDENT_AMBULATORY_CARE_PROVIDER_SITE_OTHER): Payer: BLUE CROSS/BLUE SHIELD | Admitting: Cardiology

## 2014-07-13 ENCOUNTER — Other Ambulatory Visit: Payer: Self-pay | Admitting: Surgery

## 2014-07-13 ENCOUNTER — Encounter: Payer: Self-pay | Admitting: Cardiology

## 2014-07-13 VITALS — BP 102/68 | HR 80 | Ht 71.5 in | Wt 155.4 lb

## 2014-07-13 DIAGNOSIS — I2510 Atherosclerotic heart disease of native coronary artery without angina pectoris: Secondary | ICD-10-CM

## 2014-07-13 DIAGNOSIS — Z951 Presence of aortocoronary bypass graft: Secondary | ICD-10-CM

## 2014-07-13 DIAGNOSIS — E785 Hyperlipidemia, unspecified: Secondary | ICD-10-CM | POA: Diagnosis not present

## 2014-07-13 DIAGNOSIS — D72829 Elevated white blood cell count, unspecified: Secondary | ICD-10-CM | POA: Diagnosis not present

## 2014-07-13 DIAGNOSIS — I2583 Coronary atherosclerosis due to lipid rich plaque: Principal | ICD-10-CM

## 2014-07-13 DIAGNOSIS — R739 Hyperglycemia, unspecified: Secondary | ICD-10-CM

## 2014-07-13 LAB — CBC WITH DIFFERENTIAL/PLATELET
Basophils Absolute: 0.1 10*3/uL (ref 0.0–0.1)
Basophils Relative: 1 % (ref 0–1)
EOS PCT: 3 % (ref 0–5)
Eosinophils Absolute: 0.3 10*3/uL (ref 0.0–0.7)
HEMATOCRIT: 40.5 % (ref 39.0–52.0)
Hemoglobin: 13.5 g/dL (ref 13.0–17.0)
Lymphocytes Relative: 20 % (ref 12–46)
Lymphs Abs: 1.9 10*3/uL (ref 0.7–4.0)
MCH: 30.8 pg (ref 26.0–34.0)
MCHC: 33.3 g/dL (ref 30.0–36.0)
MCV: 92.5 fL (ref 78.0–100.0)
MONOS PCT: 9 % (ref 3–12)
MPV: 8.5 fL — ABNORMAL LOW (ref 8.6–12.4)
Monocytes Absolute: 0.9 10*3/uL (ref 0.1–1.0)
NEUTROS ABS: 6.5 10*3/uL (ref 1.7–7.7)
Neutrophils Relative %: 67 % (ref 43–77)
PLATELETS: 360 10*3/uL (ref 150–400)
RBC: 4.38 MIL/uL (ref 4.22–5.81)
RDW: 13 % (ref 11.5–15.5)
WBC: 9.7 10*3/uL (ref 4.0–10.5)

## 2014-07-13 MED ORDER — LOSARTAN POTASSIUM 25 MG PO TABS: 25.0000 mg | ORAL_TABLET | Freq: Every day | ORAL | Status: DC

## 2014-07-13 MED ORDER — CARVEDILOL 3.125 MG PO TABS: 6.2500 mg | ORAL_TABLET | Freq: Two times a day (BID) | ORAL | Status: DC

## 2014-07-13 MED ORDER — ATORVASTATIN CALCIUM 80 MG PO TABS: 80.0000 mg | ORAL_TABLET | Freq: Every day | ORAL | Status: DC

## 2014-07-13 NOTE — Progress Notes (Signed)
Cardiology Office Note   Date:  07/13/2014   ID:  Kelly Harpin., DOB 04/05/1950, MRN [id number]  PCP:  Kelly Shelling, MD  Cardiologist:  Dr. Ellyn Olson    Chief Complaint  Patient presents with  . Hospitalization Follow-up     no chest pain, no shortness of breath, no edema, no pain in legs, no cramping in legs, no lightheadedness, no dizziness      History of Present Illness: Kelly Winch. is a 64 y.o. male who presents for follow up  Coronary artery bypass grafting x 4   Left internal mammary graft to the OM  Right internal mammary graft to the LAD  SVG to diagonal  SVG to PDA  This was done 06/04/14 for  Chest pain, positive nuc.high risk with multivessel distribution ischemia.  Cardiac cath with severe multivessel CAD with mLAD-100%, mRCA 99%, dRCA 100%, mLCX 90%  And EF 35-45%.   He did well post op and at home has done very well with exercise 45 min to 1 hour BID.  He begins cardiac rehab next week and follows up with Dr. Cyndia Olson on Wed.     Last week was seen in ER with rt upper chest pain. He had stretched with putting remote on table and felt a pop.  EKG was stable and troponin was negative.  He has had no pain or discomfort since that time.  His WBC was elevated in ER but looking back his WBC has been about the same.   He is not using tobacco.  His appetite is good and he and his wife are eating heart healthy diet.    Past Medical History  Diagnosis Date  . Coronary artery disease   . Hyperlipemia     Past Surgical History  Procedure Laterality Date  . Tonsillectomy  1957  . Cardiac catheterization  06/02/2014  . Cardiac catheterization N/A 06/02/2014    Procedure: Left Heart Cath and Coronary Angiography;  Surgeon: Leonie Man, MD;  Location: [facility] INVASIVE CV LAB CUPID;  Service: Cardiovascular;  Laterality: N/A;  . Coronary artery bypass graft N/A 06/04/2014    Procedure: CORONARY ARTERY BYPASS GRAFTING (CABG) times four using bilateral internal  mammary arteries and EVH for left  leg saphenous vein;  Surgeon: Gaye Pollack, MD;  Location: [place];  Service: Open Heart Surgery;  Laterality: N/A;  . Tee without cardioversion N/A 06/04/2014    Procedure: TRANSESOPHAGEAL ECHOCARDIOGRAM (TEE);  Surgeon: Gaye Pollack, MD;  Location: [place];  Service: Open Heart Surgery;  Laterality: N/A;     Current Outpatient Prescriptions  Medication Sig Dispense Refill  . aspirin EC 325 MG tablet TAKE 1 TABLET BY MOUTH DAILY 30 tablet 5  . atorvastatin (LIPITOR) 80 MG tablet Take 1 tablet (80 mg total) by mouth daily at 6 PM. 30 tablet 1  . carvedilol (COREG) 3.125 MG tablet Take 2 tablets (6.25 mg total) by mouth 2 (two) times daily. 120 tablet 5  . losartan (COZAAR) 25 MG tablet Take 25 mg by mouth daily.    Marland Kitchen oxyCODONE (OXY IR/ROXICODONE) 5 MG immediate release tablet Take 1-2 tablets (5-10 mg total) by mouth every 6 (six) hours as needed for severe pain. 50 tablet 0   No current facility-administered medications for this visit.    Allergies:   Review of patient's allergies indicates no known allergies.    Social History:  The patient  reports that he quit smoking about 6 weeks ago. His smoking  use included Cigarettes. He has a 36.8 pack-year smoking history. He has never used smokeless tobacco. He reports that he drinks about 12.6 - 16.8 oz of alcohol per week. He reports that he does not use illicit drugs.   Family History:  The patient's family history includes Cardiomyopathy in his father; Emphysema in his mother; Healthy in his sister and sister; Heart attack in his maternal grandfather; Heart disease in his paternal uncle.    ROS:  General:no colds or fevers, weight was 159 prior to CABG Skin:no rashes or ulcers HEENT:no blurred vision, no congestion CV:see HPI PUL:see HPI GI:no diarrhea constipation or melena, no indigestion GU:no hematuria, no dysuria MS:no joint pain, no claudication Neuro:no syncope, no lightheadedness Endo:no  diabetes though hgbA1c was 6.0- will recheck on next visit, no thyroid disease  Wt Readings from Last 3 Encounters:  07/13/14 155 lb 6.4 oz (70.489 kg)  07/09/14 153 lb (69.4 kg)  07/07/14 149 lb (67.586 kg)     PHYSICAL EXAM: VS:  BP 102/68 mmHg  Pulse 80  Ht 5' 11.5" (1.816 m)  Wt 155 lb 6.4 oz (70.489 kg)  BMI 21.37 kg/m2 , BMI Body mass index is 21.37 kg/(m^2). General:Pleasant affect, NAD Skin:Warm and dry, brisk capillary refill HEENT:normocephalic, sclera clear, mucus membranes moist Neck:supple, no JVD, no bruits  Heart:S1S2 RRR without murmur, gallup, rub or click, chest wall incision is well healed sm. Scab on on small area of chest incision.  Lungs:clear without rales, rhonchi, or wheezes GUY:QIHK, non tender, + BS, do not palpate liver spleen or masses Ext:no lower ext edema, 2+ pedal pulses, 2+ radial pulses Neuro:alert and oriented X 3, MAE, follows commands, + facial symmetry    EKG:  EKG is NOT ordered today. Ekg done in ER on the 7th of June was reviewed and without change from post CABG.    Recent Labs: 06/01/2014: ALT 19 06/05/2014: Magnesium 2.1 06/06/2014: B Natriuretic Peptide 583.9* 07/07/2014: BUN 18; Creatinine, Ser 0.87; Hemoglobin 14.2; Platelets 270; Potassium 4.2; Sodium 134*    Lipid Panel    Component Value Date/Time   CHOL 158 06/04/2014 0528   TRIG 78 06/04/2014 0528   HDL 54 06/04/2014 0528   CHOLHDL 2.9 06/04/2014 0528   VLDL 16 06/04/2014 0528   LDLCALC 88 06/04/2014 0528       Other studies Reviewed: Additional studies/ records that were reviewed today include: CABAG hospitalization previous OV notes, ER visit and labs and xray.   ASSESSMENT AND PLAN:  1.  CAD of native vessels with CABG X 4 06/04/14.  Recovering well, brief rt upper chest pain seen in ER thought to be Muscular skeletal and has since resolved.  Continue exercise, attend cardiac rehab.  Follow up with Dr. Cyndia Olson as instructed and Dr. Ellyn Olson in 2-3 months.   2.  Elevated WBC will recheck today, no fevers  3. Hyperlipidemia on lipitor 80, recheck lipids and hepatic when seen back by Dr. Ellyn Olson.  4. ABL anemia has resolved  5. Elevated HGBA1C, now with exercise and improved diet, recheck on next visit with Dr.D. Kelly Olson.  6. LV dysfunction, with lower BP could not increase coreg or cozaar today.  With improved blood flow EF should improve.     Current medicines are reviewed with the patient today.  The patient Has no concerns regarding medicines.  The following changes have been made:  See above Labs/ tests ordered today include:see above  Disposition:   FU:  see above  Signed, Kelly Serge, NP  07/13/2014 8:30 AM    [place], [place], [place] Phone: [phone]; Fax: [phone]

## 2014-07-13 NOTE — Patient Instructions (Signed)
Dr.Harding Thurs 10/01/14 at 8:30am   CBC today   Continue same medications

## 2014-07-15 ENCOUNTER — Encounter: Payer: Self-pay | Admitting: Surgery

## 2014-07-15 ENCOUNTER — Ambulatory Visit
Admission: RE | Admit: 2014-07-15 | Discharge: 2014-07-15 | Disposition: A | Discharge status: Home / Self Care | Payer: BLUE CROSS/BLUE SHIELD | Source: Ambulatory Visit | Attending: Surgery | Admitting: Surgery

## 2014-07-15 ENCOUNTER — Ambulatory Visit (INDEPENDENT_AMBULATORY_CARE_PROVIDER_SITE_OTHER): Payer: Self-pay | Admitting: Surgery

## 2014-07-15 VITALS — BP 123/87 | HR 87 | Resp 20 | Ht 71.5 in | Wt 155.0 lb

## 2014-07-15 DIAGNOSIS — Z951 Presence of aortocoronary bypass graft: Secondary | ICD-10-CM

## 2014-07-15 DIAGNOSIS — I2510 Atherosclerotic heart disease of native coronary artery without angina pectoris: Secondary | ICD-10-CM

## 2014-07-15 NOTE — Progress Notes (Signed)
      HPI: Patient returns for routine postoperative follow-up having undergone CABG x 4 using bilateral IMA grafts on 06/04/2014. The patient's early postoperative recovery while in the hospital was notable for an uncomplicated postop course. Since hospital discharge the patient reports that he has been feeling well and is walking about 2 hrs per day without chest pain or dyspnea. He has continued to abstain from smoking. He was seen in the ER last week after developing some right-sided chest pain after reaching for his TV remote and hearing a pop but it was felt to be musculoskeletal and it resolved quickly without recurrence.   Current Outpatient Prescriptions  Medication Sig Dispense Refill  . aspirin EC 325 MG tablet TAKE 1 TABLET BY MOUTH DAILY 30 tablet 5  . atorvastatin (LIPITOR) 80 MG tablet Take 1 tablet (80 mg total) by mouth daily at 6 PM. 90 tablet 3  . carvedilol (COREG) 3.125 MG tablet Take 2 tablets (6.25 mg total) by mouth 2 (two) times daily. 360 tablet 3  . losartan (COZAAR) 25 MG tablet Take 1 tablet (25 mg total) by mouth daily. 90 tablet 3  . oxyCODONE (OXY IR/ROXICODONE) 5 MG immediate release tablet Take 1-2 tablets (5-10 mg total) by mouth every 6 (six) hours as needed for severe pain. 50 tablet 0   No current facility-administered medications for this visit.    Physical Exam: BP 123/87 mmHg  Pulse 87  Resp 20  Ht 5' 11.5" (1.816 m)  Wt 155 lb (70.308 kg)  BMI 21.32 kg/m2  SpO2 98% He looks well. Lung exam is clear. Cardiac exam shows a regular rate and rhythm with normal heart sounds. Chest incision is healing well and sternum is stable. The leg incisions are healing well and there is no peripheral edema.    Diagnostic Tests:  CLINICAL DATA: Post CABG  EXAM: CHEST 2 VIEW  COMPARISON: 07/07/2014  FINDINGS: Cardiomediastinal silhouette is stable. Status post CABG. Again noted hyperinflation and chronic mild interstitial  prominence. Bilateral nodular nipple shadow again noted. No acute infiltrate or pulmonary edema. Bony thorax is stable.  IMPRESSION: No active cardiopulmonary disease. Status post CABG. Again noted mild hyperinflation and chronic mild interstitial prominence.   Electronically Signed  By: Lahoma Crocker M.D.  On: 07/15/2014 08:46  Impression:  Overall I think he is doing very well. I encouraged him to continue walking. He is planning to participate in cardiac rehab. I told him he could drive his car but should not lift anything heavier than 10 lbs for three months postop. 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 He seems very motivated to modify his cardiac risk factors including complete smoking cessation.   Plan:  He will continue follow up with Dr. Laurann Montana and Dr. Ellyn Hack and will contact me if he develops any problems with his incisions.   Gaye Pollack, MD [facility] [phone]

## 2014-07-17 ENCOUNTER — Encounter (HOSPITAL_COMMUNITY)
Admission: RE | Admit: 2014-07-17 | Discharge: 2014-07-17 | Disposition: A | Discharge status: Home / Self Care | Payer: BLUE CROSS/BLUE SHIELD | Source: Ambulatory Visit | Attending: Cardiology | Admitting: Cardiology

## 2014-07-17 DIAGNOSIS — Z951 Presence of aortocoronary bypass graft: Secondary | ICD-10-CM | POA: Diagnosis not present

## 2014-07-17 DIAGNOSIS — Z48812 Encounter for surgical aftercare following surgery on the circulatory system: Secondary | ICD-10-CM | POA: Diagnosis present

## 2014-07-17 NOTE — Progress Notes (Signed)
Pt started cardiac rehab phase II today at the 6:45 class.  Pt tolerated light exercise without difficulty. VSS with low exit readings post exercise, telemetry-SR with no noted ectopy, asymptomatic.  Medication list reconciled.  Pt verbalized compliance with medications and denies barriers to compliance. However there appears to be a question regarding the dosage for Losartan. On discharge summary the Losartan dosage is 12.5 mg.  It is unclear when/who made the change to 25 mg.  Pt believes it may have been changed at the ER visit. Pt vaguely recalls that during the medication recount his wife may have mistakenly listed the Losartan dose 25 verses 12.5.  Pt plans to check with her and his bottle.  Advised pt to just take the 12.5 mg until the medication can be clarified.  In basket sent to dr. Ellyn Hack for his review and recommendations.  PSYCHOSOCIAL ASSESSMENT:  PHQ-0. Pt exhibits positive coping skills, hopeful outlook with supportive family Pt wife attended the orientation class this past Thursday.  No psychosocial needs identified at this time, no psychosocial interventions necessary.  Pt feels hopeful regarding his future.   Pt enjoys playing golf and going to [facility].  Pt enjoys playing black jack.   Pt cardiac rehab  goal is  to gain strength quickly. Pt admits he is not very patient and tends to do everything "quickly".  His wife encouraged him to participate in cardiac rehab to learn about boundaries.  Education classes will be key for the success of this. Pt encouraged to participate in educational classes and home exercise instruction to better understand where he should be at this time to increase ability to achieve these goals.   Pt long term cardiac rehab goal is hinges on the the short term goal to learn limitations and when he should push.  Pt would also like to have better fitness and health. Will monitor pt met progressions and periodically check in to see where he is with his home exercise. Pt  oriented to exercise equipment and routine.  Understanding verbalized. Cherre Huger, BSN

## 2014-07-20 ENCOUNTER — Encounter (HOSPITAL_COMMUNITY)
Admission: RE | Admit: 2014-07-20 | Discharge: 2014-07-20 | Disposition: A | Discharge status: Home / Self Care | Payer: BLUE CROSS/BLUE SHIELD | Source: Ambulatory Visit | Attending: Cardiology | Admitting: Cardiology

## 2014-07-20 ENCOUNTER — Telehealth (HOSPITAL_COMMUNITY): Payer: Self-pay | Admitting: *Deleted

## 2014-07-20 DIAGNOSIS — Z48812 Encounter for surgical aftercare following surgery on the circulatory system: Secondary | ICD-10-CM | POA: Diagnosis not present

## 2014-07-20 NOTE — Telephone Encounter (Signed)
-----   Message from Leonie Man, MD sent at 07/17/2014  4:41 PM EDT ----- Regarding: RE: clarification of medication - Losartan Just take 1/2 dose   DH ----- Message -----    From: Rowe Pavy, RN    Sent: 07/17/2014   8:10 AM      To: Leonie Man, MD Subject: clarification of medication - Losartan         Pt in today for his first day of cardiac rehab s/p 06/03/24 cabg x 4 lima.  Post exercise bp 91/50 with auto bp cuff. Repeat after water 100/60   Review medications - seems as though there may be a discrepancy for the dosage of Losartan.  On the d/c summary it is written for 12.5.  However for his follow up visits (this week one with Cecilie Kicks and [place]) it is listed as '25mg'$  and I do not see where the medication was increased.  Talked with pt regarding his recollection - He believes when he came into the ER for chest pain - ended up being muscle strain his wife indicated he was taken a whole tablet.  It may have been changed at that point based on pt reported.  I advised pt to take just the 12.'5mg'$  until we can clarify what the dosage should be.  He plans to check his bottle as well.  Your thoughts  Shawna Wearing

## 2014-07-22 ENCOUNTER — Encounter (HOSPITAL_COMMUNITY)
Admission: RE | Admit: 2014-07-22 | Discharge: 2014-07-22 | Disposition: A | Discharge status: Home / Self Care | Payer: BLUE CROSS/BLUE SHIELD | Source: Ambulatory Visit | Attending: Cardiology | Admitting: Cardiology

## 2014-07-22 DIAGNOSIS — Z48812 Encounter for surgical aftercare following surgery on the circulatory system: Secondary | ICD-10-CM | POA: Diagnosis not present

## 2014-07-24 ENCOUNTER — Encounter (HOSPITAL_COMMUNITY)
Admission: RE | Admit: 2014-07-24 | Discharge: 2014-07-24 | Disposition: A | Discharge status: Home / Self Care | Payer: BLUE CROSS/BLUE SHIELD | Source: Ambulatory Visit | Attending: Cardiology | Admitting: Cardiology

## 2014-07-24 DIAGNOSIS — Z48812 Encounter for surgical aftercare following surgery on the circulatory system: Secondary | ICD-10-CM | POA: Diagnosis not present

## 2014-07-27 ENCOUNTER — Encounter (HOSPITAL_COMMUNITY)
Admission: RE | Admit: 2014-07-27 | Discharge: 2014-07-27 | Disposition: A | Discharge status: Home / Self Care | Payer: BLUE CROSS/BLUE SHIELD | Source: Ambulatory Visit | Attending: Cardiology | Admitting: Cardiology

## 2014-07-27 DIAGNOSIS — Z48812 Encounter for surgical aftercare following surgery on the circulatory system: Secondary | ICD-10-CM | POA: Diagnosis not present

## 2014-07-29 ENCOUNTER — Encounter (HOSPITAL_COMMUNITY)
Admission: RE | Admit: 2014-07-29 | Discharge: 2014-07-29 | Disposition: A | Discharge status: Home / Self Care | Payer: BLUE CROSS/BLUE SHIELD | Source: Ambulatory Visit | Attending: Cardiology | Admitting: Cardiology

## 2014-07-29 DIAGNOSIS — Z48812 Encounter for surgical aftercare following surgery on the circulatory system: Secondary | ICD-10-CM | POA: Diagnosis not present

## 2014-07-29 NOTE — Progress Notes (Signed)
Reviewed home exercise with pt today.  Pt plans to walk for exercise, 2-3 days in addition to CRPII.  Reviewed THR, pulse, RPE, sign and symptoms, and when to call 911 or MD.  Pt voiced understanding.     Dorna Bloom, [place] ACSM RCEP

## 2014-07-31 ENCOUNTER — Encounter (HOSPITAL_COMMUNITY)
Admission: RE | Admit: 2014-07-31 | Discharge: 2014-07-31 | Disposition: A | Discharge status: Home / Self Care | Payer: BLUE CROSS/BLUE SHIELD | Source: Ambulatory Visit | Attending: Cardiology | Admitting: Cardiology

## 2014-07-31 DIAGNOSIS — Z951 Presence of aortocoronary bypass graft: Secondary | ICD-10-CM | POA: Diagnosis not present

## 2014-07-31 DIAGNOSIS — Z48812 Encounter for surgical aftercare following surgery on the circulatory system: Secondary | ICD-10-CM | POA: Insufficient documentation

## 2014-07-31 NOTE — Progress Notes (Signed)
Kelly Olson. 64 y.o. male Nutrition Note Spoke with pt.  Nutrition Survey reviewed with pt. Pt is following Step 2 of the Therapeutic Lifestyle Changes diet. Pt has made several dietary changes since starting rehab (e.g. switched from Cherrios and Raisin Bran to Shredded Wheat and Bran Flakes and pt is now using light salad dressing and "I'm using a lot less of it."). Pt expressed understanding of the information reviewed. Pt aware of nutrition education classes offered. Lab Results  Component Value Date   HGBA1C 6.0* 06/03/2014   Wt Readings from Last 3 Encounters:  07/15/14 155 lb (70.308 kg)  07/13/14 155 lb 6.4 oz (70.489 kg)  07/09/14 153 lb (69.4 kg)   Nutrition Diagnosis ? Food-and nutrition-related knowledge deficit related to lack of exposure to information as related to diagnosis of: ? CVD ? Pre-DM  Nutrition Intervention ? Benefits of adopting Therapeutic Lifestyle Changes discussed when Medficts reviewed. ? Pt to attend the Portion Distortion class ? Pt to attend the  ? Nutrition I class - met; 07/28/14                    ? Nutrition II class ? Pt given handouts for: ? Pre-diabetes ? Continue client-centered nutrition education by RD, as part of interdisciplinary care.  Goal(s) ? Pt to describe the benefit of including fruits, vegetables, whole grains, and low-fat dairy products in a heart healthy meal plan.  Monitor and Evaluate progress toward nutrition goal with team.  Derek Mound, M.Ed, RD, LDN, CDE 07/31/2014 8:12 AM

## 2014-08-05 ENCOUNTER — Encounter (HOSPITAL_COMMUNITY)
Admission: RE | Admit: 2014-08-05 | Discharge: 2014-08-05 | Disposition: A | Discharge status: Home / Self Care | Payer: BLUE CROSS/BLUE SHIELD | Source: Ambulatory Visit | Attending: Cardiology | Admitting: Cardiology

## 2014-08-05 DIAGNOSIS — Z48812 Encounter for surgical aftercare following surgery on the circulatory system: Secondary | ICD-10-CM | POA: Diagnosis not present

## 2014-08-07 ENCOUNTER — Encounter (HOSPITAL_COMMUNITY)
Admission: RE | Admit: 2014-08-07 | Discharge: 2014-08-07 | Disposition: A | Discharge status: Home / Self Care | Payer: BLUE CROSS/BLUE SHIELD | Source: Ambulatory Visit | Attending: Cardiology | Admitting: Cardiology

## 2014-08-07 DIAGNOSIS — Z48812 Encounter for surgical aftercare following surgery on the circulatory system: Secondary | ICD-10-CM | POA: Diagnosis not present

## 2014-08-07 NOTE — Progress Notes (Signed)
QUALITY OF LIFE SCORE REVIEW Pt completed quality of life survey as a participant in cardiac rehab.  Scores less than 21 are considered low. Pt results are as follows overall 26.92, health and function 25.53, socioeconomic 27.14, Psychological and spiritual 29.64 and family 27.00.  Patient with no needs identified. Pt feels very supported by his wife and denies any needs at this time. Will continue to monitor and intervene as necessary. Cherre Huger, BSN

## 2014-08-10 ENCOUNTER — Encounter (HOSPITAL_COMMUNITY)
Admission: RE | Admit: 2014-08-10 | Discharge: 2014-08-10 | Disposition: A | Discharge status: Home / Self Care | Payer: BLUE CROSS/BLUE SHIELD | Source: Ambulatory Visit | Attending: Cardiology | Admitting: Cardiology

## 2014-08-10 DIAGNOSIS — Z48812 Encounter for surgical aftercare following surgery on the circulatory system: Secondary | ICD-10-CM | POA: Diagnosis not present

## 2014-08-12 ENCOUNTER — Encounter (HOSPITAL_COMMUNITY)
Admission: RE | Admit: 2014-08-12 | Discharge: 2014-08-12 | Disposition: A | Discharge status: Home / Self Care | Payer: BLUE CROSS/BLUE SHIELD | Source: Ambulatory Visit | Attending: Cardiology | Admitting: Cardiology

## 2014-08-12 DIAGNOSIS — Z48812 Encounter for surgical aftercare following surgery on the circulatory system: Secondary | ICD-10-CM | POA: Diagnosis not present

## 2014-08-14 ENCOUNTER — Encounter (HOSPITAL_COMMUNITY)
Admission: RE | Admit: 2014-08-14 | Discharge: 2014-08-14 | Disposition: A | Discharge status: Home / Self Care | Payer: BLUE CROSS/BLUE SHIELD | Source: Ambulatory Visit | Attending: Cardiology | Admitting: Cardiology

## 2014-08-14 DIAGNOSIS — Z48812 Encounter for surgical aftercare following surgery on the circulatory system: Secondary | ICD-10-CM | POA: Diagnosis not present

## 2014-08-17 ENCOUNTER — Encounter (HOSPITAL_COMMUNITY)
Admission: RE | Admit: 2014-08-17 | Discharge: 2014-08-17 | Disposition: A | Discharge status: Home / Self Care | Payer: BLUE CROSS/BLUE SHIELD | Source: Ambulatory Visit | Attending: Cardiology | Admitting: Cardiology

## 2014-08-17 DIAGNOSIS — Z48812 Encounter for surgical aftercare following surgery on the circulatory system: Secondary | ICD-10-CM | POA: Diagnosis not present

## 2014-08-17 NOTE — Progress Notes (Signed)
  30 day Psychosocial followup assessment  Patient psychosocial assessment reveals no barriers to cardiac rehab participation.  Pt demonstrates appropriate and healthy positive coping skills.  Patient does feel he is making progress towards cardiac rehab goals. Pt reports that with the hot humid weather he is having more shortness of breath.  Much of this he attributes to former smoker who quit with his heart event. Pt has done well with his tobacco cessation and is glad that he stopped smoking. Patient reports health and activity level improved in the past 30 days as evidenced by patient's reports of continuing with the same activities he did before with ease.  Pt states he is feeling great, heart is getting stronger, lungs still need improvement but feels he is making progress. Patient reports feeling positive about current and projected progress toward cardiac rehab goals.  Patient's rate of progress towards goals is excellent.  Pt maintains excellent attendance to exercise and education.  Plan of action to help patient continue to work towards rehab goals include monitor workloads with MET level progression.  Will continue to monitor and evaluate progress toward psychosocial goals.  Goals in progress: improved management of shortness of breath. Help patient work toward returning to meaningful activities that improve patient's quality of life and are attainable. Cherre Huger, BSN

## 2014-08-19 ENCOUNTER — Encounter (HOSPITAL_COMMUNITY)
Admission: RE | Admit: 2014-08-19 | Discharge: 2014-08-19 | Disposition: A | Discharge status: Home / Self Care | Payer: BLUE CROSS/BLUE SHIELD | Source: Ambulatory Visit | Attending: Cardiology | Admitting: Cardiology

## 2014-08-19 DIAGNOSIS — Z48812 Encounter for surgical aftercare following surgery on the circulatory system: Secondary | ICD-10-CM | POA: Diagnosis not present

## 2014-08-21 ENCOUNTER — Encounter (HOSPITAL_COMMUNITY)
Admission: RE | Admit: 2014-08-21 | Discharge: 2014-08-21 | Disposition: A | Discharge status: Home / Self Care | Payer: BLUE CROSS/BLUE SHIELD | Source: Ambulatory Visit | Attending: Cardiology | Admitting: Cardiology

## 2014-08-21 DIAGNOSIS — Z48812 Encounter for surgical aftercare following surgery on the circulatory system: Secondary | ICD-10-CM | POA: Diagnosis not present

## 2014-08-24 ENCOUNTER — Encounter (HOSPITAL_COMMUNITY)
Admission: RE | Admit: 2014-08-24 | Discharge: 2014-08-24 | Disposition: A | Discharge status: Home / Self Care | Payer: BLUE CROSS/BLUE SHIELD | Source: Ambulatory Visit | Attending: Cardiology | Admitting: Cardiology

## 2014-08-24 DIAGNOSIS — Z48812 Encounter for surgical aftercare following surgery on the circulatory system: Secondary | ICD-10-CM | POA: Diagnosis not present

## 2014-08-26 ENCOUNTER — Encounter (HOSPITAL_COMMUNITY)
Admission: RE | Admit: 2014-08-26 | Discharge: 2014-08-26 | Disposition: A | Discharge status: Home / Self Care | Payer: BLUE CROSS/BLUE SHIELD | Source: Ambulatory Visit | Attending: Cardiology | Admitting: Cardiology

## 2014-08-26 DIAGNOSIS — Z48812 Encounter for surgical aftercare following surgery on the circulatory system: Secondary | ICD-10-CM | POA: Diagnosis not present

## 2014-08-28 ENCOUNTER — Encounter (HOSPITAL_COMMUNITY)
Admission: RE | Admit: 2014-08-28 | Discharge: 2014-08-28 | Disposition: A | Discharge status: Home / Self Care | Payer: BLUE CROSS/BLUE SHIELD | Source: Ambulatory Visit | Attending: Cardiology | Admitting: Cardiology

## 2014-08-28 DIAGNOSIS — Z48812 Encounter for surgical aftercare following surgery on the circulatory system: Secondary | ICD-10-CM | POA: Diagnosis not present

## 2014-08-31 ENCOUNTER — Encounter (HOSPITAL_COMMUNITY)
Admission: RE | Admit: 2014-08-31 | Discharge: 2014-08-31 | Disposition: A | Discharge status: Home / Self Care | Payer: BLUE CROSS/BLUE SHIELD | Source: Ambulatory Visit | Attending: Cardiology | Admitting: Cardiology

## 2014-08-31 DIAGNOSIS — Z951 Presence of aortocoronary bypass graft: Secondary | ICD-10-CM | POA: Diagnosis not present

## 2014-08-31 DIAGNOSIS — Z48812 Encounter for surgical aftercare following surgery on the circulatory system: Secondary | ICD-10-CM | POA: Insufficient documentation

## 2014-09-02 ENCOUNTER — Encounter (HOSPITAL_COMMUNITY)
Admission: RE | Admit: 2014-09-02 | Discharge: 2014-09-02 | Disposition: A | Discharge status: Home / Self Care | Payer: BLUE CROSS/BLUE SHIELD | Source: Ambulatory Visit | Attending: Cardiology | Admitting: Cardiology

## 2014-09-02 DIAGNOSIS — Z48812 Encounter for surgical aftercare following surgery on the circulatory system: Secondary | ICD-10-CM | POA: Diagnosis not present

## 2014-09-04 ENCOUNTER — Encounter (HOSPITAL_COMMUNITY)
Admission: RE | Admit: 2014-09-04 | Discharge: 2014-09-04 | Disposition: A | Discharge status: Home / Self Care | Payer: BLUE CROSS/BLUE SHIELD | Source: Ambulatory Visit | Attending: Cardiology | Admitting: Cardiology

## 2014-09-04 DIAGNOSIS — Z48812 Encounter for surgical aftercare following surgery on the circulatory system: Secondary | ICD-10-CM | POA: Diagnosis not present

## 2014-09-07 ENCOUNTER — Telehealth: Payer: Self-pay | Admitting: *Deleted

## 2014-09-07 ENCOUNTER — Encounter (HOSPITAL_COMMUNITY)
Admission: RE | Admit: 2014-09-07 | Discharge: 2014-09-07 | Disposition: A | Discharge status: Home / Self Care | Payer: BLUE CROSS/BLUE SHIELD | Source: Ambulatory Visit | Attending: Cardiology | Admitting: Cardiology

## 2014-09-07 DIAGNOSIS — Z48812 Encounter for surgical aftercare following surgery on the circulatory system: Secondary | ICD-10-CM | POA: Diagnosis not present

## 2014-09-07 NOTE — Telephone Encounter (Signed)
Called Kelly Olson for his 90 day follow-up for the LEVO-CTS trial. He stated he has been doing well and has had no adverse events or hospitalizations. He has completed his follow-up for the trial.

## 2014-09-09 ENCOUNTER — Encounter (HOSPITAL_COMMUNITY)
Admission: RE | Admit: 2014-09-09 | Discharge: 2014-09-09 | Disposition: A | Discharge status: Home / Self Care | Payer: BLUE CROSS/BLUE SHIELD | Source: Ambulatory Visit | Attending: Cardiology | Admitting: Cardiology

## 2014-09-09 DIAGNOSIS — Z48812 Encounter for surgical aftercare following surgery on the circulatory system: Secondary | ICD-10-CM | POA: Diagnosis not present

## 2014-09-11 ENCOUNTER — Encounter (HOSPITAL_COMMUNITY)
Admission: RE | Admit: 2014-09-11 | Discharge: 2014-09-11 | Disposition: A | Discharge status: Home / Self Care | Payer: BLUE CROSS/BLUE SHIELD | Source: Ambulatory Visit | Attending: Cardiology | Admitting: Cardiology

## 2014-09-11 DIAGNOSIS — Z48812 Encounter for surgical aftercare following surgery on the circulatory system: Secondary | ICD-10-CM | POA: Diagnosis not present

## 2014-09-14 ENCOUNTER — Encounter (HOSPITAL_COMMUNITY)
Admission: RE | Admit: 2014-09-14 | Discharge: 2014-09-14 | Disposition: A | Discharge status: Home / Self Care | Payer: BLUE CROSS/BLUE SHIELD | Source: Ambulatory Visit | Attending: Cardiology | Admitting: Cardiology

## 2014-09-14 DIAGNOSIS — Z48812 Encounter for surgical aftercare following surgery on the circulatory system: Secondary | ICD-10-CM | POA: Diagnosis not present

## 2014-09-15 ENCOUNTER — Telehealth: Payer: Self-pay | Admitting: Cardiology

## 2014-09-15 NOTE — Telephone Encounter (Signed)
Pt is leaving the country for 2 weeks,first trip since his by pass surgery on 06-04-14. He wants to know if the doctor or PA have any recommendations for his first long trip?

## 2014-09-15 NOTE — Telephone Encounter (Signed)
RN spoke to Kelly Baas NP- no other preparation need other than copy of current EKG. Patient states he does not have copy.  He will come by office to pick up a copy.

## 2014-09-15 NOTE — Telephone Encounter (Signed)
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 He states he has medication and information of previous surgery.  He wanted to know if there is anything else he needs to do to prepare for trip. RN informed him to wear compression socks and get up and move around every few hours. RN will speak with( d.o.d /or extender) for any further instructions. Contact patient.

## 2014-09-16 ENCOUNTER — Encounter (HOSPITAL_COMMUNITY)
Admission: RE | Admit: 2014-09-16 | Discharge: 2014-09-16 | Disposition: A | Discharge status: Home / Self Care | Payer: BLUE CROSS/BLUE SHIELD | Source: Ambulatory Visit | Attending: Cardiology | Admitting: Cardiology

## 2014-09-16 DIAGNOSIS — Z48812 Encounter for surgical aftercare following surgery on the circulatory system: Secondary | ICD-10-CM | POA: Diagnosis not present

## 2014-09-18 ENCOUNTER — Encounter (HOSPITAL_COMMUNITY)
Admission: RE | Admit: 2014-09-18 | Discharge: 2014-09-18 | Disposition: A | Discharge status: Home / Self Care | Payer: BLUE CROSS/BLUE SHIELD | Source: Ambulatory Visit | Attending: Cardiology | Admitting: Cardiology

## 2014-09-18 ENCOUNTER — Encounter (HOSPITAL_COMMUNITY): Payer: BLUE CROSS/BLUE SHIELD

## 2014-09-18 DIAGNOSIS — Z48812 Encounter for surgical aftercare following surgery on the circulatory system: Secondary | ICD-10-CM | POA: Diagnosis not present

## 2014-09-21 ENCOUNTER — Encounter (HOSPITAL_COMMUNITY)
Admission: RE | Admit: 2014-09-21 | Discharge: 2014-09-21 | Disposition: A | Discharge status: Home / Self Care | Payer: BLUE CROSS/BLUE SHIELD | Source: Ambulatory Visit | Attending: Cardiology | Admitting: Cardiology

## 2014-09-21 DIAGNOSIS — Z48812 Encounter for surgical aftercare following surgery on the circulatory system: Secondary | ICD-10-CM | POA: Diagnosis not present

## 2014-09-23 ENCOUNTER — Encounter (HOSPITAL_COMMUNITY): Payer: BLUE CROSS/BLUE SHIELD

## 2014-09-25 ENCOUNTER — Encounter (HOSPITAL_COMMUNITY): Payer: BLUE CROSS/BLUE SHIELD

## 2014-09-28 ENCOUNTER — Encounter (HOSPITAL_COMMUNITY): Payer: BLUE CROSS/BLUE SHIELD

## 2014-09-30 ENCOUNTER — Encounter (HOSPITAL_COMMUNITY): Payer: BLUE CROSS/BLUE SHIELD

## 2014-10-01 ENCOUNTER — Ambulatory Visit: Payer: BLUE CROSS/BLUE SHIELD | Admitting: Cardiology

## 2014-10-01 HISTORY — PX: TRANSTHORACIC ECHOCARDIOGRAM: SHX275

## 2014-10-02 ENCOUNTER — Encounter (HOSPITAL_COMMUNITY): Admission: RE | Admit: 2014-10-02 | Payer: BLUE CROSS/BLUE SHIELD | Source: Ambulatory Visit

## 2014-10-07 ENCOUNTER — Encounter (HOSPITAL_COMMUNITY)
Admission: RE | Admit: 2014-10-07 | Discharge: 2014-10-07 | Disposition: A | Discharge status: Home / Self Care | Payer: BLUE CROSS/BLUE SHIELD | Source: Ambulatory Visit | Attending: Cardiology | Admitting: Cardiology

## 2014-10-07 DIAGNOSIS — Z951 Presence of aortocoronary bypass graft: Secondary | ICD-10-CM | POA: Insufficient documentation

## 2014-10-07 DIAGNOSIS — Z48812 Encounter for surgical aftercare following surgery on the circulatory system: Secondary | ICD-10-CM | POA: Diagnosis not present

## 2014-10-07 NOTE — Progress Notes (Signed)
Pt returned from his trip [place] to [place].   Pt with upcoming MD appt on Friday. Pt given rehab report for Dr. Ellyn Hack to review. Cherre Huger, BSN

## 2014-10-09 ENCOUNTER — Ambulatory Visit (INDEPENDENT_AMBULATORY_CARE_PROVIDER_SITE_OTHER): Payer: BLUE CROSS/BLUE SHIELD | Admitting: Cardiology

## 2014-10-09 ENCOUNTER — Encounter (HOSPITAL_COMMUNITY): Payer: BLUE CROSS/BLUE SHIELD

## 2014-10-09 ENCOUNTER — Encounter: Payer: Self-pay | Admitting: Cardiology

## 2014-10-09 VITALS — BP 110/60 | HR 72 | Ht 72.0 in | Wt 152.3 lb

## 2014-10-09 DIAGNOSIS — I255 Ischemic cardiomyopathy: Secondary | ICD-10-CM | POA: Insufficient documentation

## 2014-10-09 DIAGNOSIS — I25119 Atherosclerotic heart disease of native coronary artery with unspecified angina pectoris: Secondary | ICD-10-CM | POA: Diagnosis not present

## 2014-10-09 DIAGNOSIS — Z720 Tobacco use: Secondary | ICD-10-CM

## 2014-10-09 DIAGNOSIS — I208 Other forms of angina pectoris: Secondary | ICD-10-CM

## 2014-10-09 DIAGNOSIS — I209 Angina pectoris, unspecified: Secondary | ICD-10-CM

## 2014-10-09 DIAGNOSIS — E785 Hyperlipidemia, unspecified: Secondary | ICD-10-CM | POA: Diagnosis not present

## 2014-10-09 NOTE — Patient Instructions (Signed)
Your physician has requested that you have an echocardiogram. Echocardiography is a painless test that uses sound waves to create images of your heart. It provides your doctor with information about the size and shape of your heart and how well your heart's chambers and valves are working. This procedure takes approximately one hour. There are no restrictions for this procedure.  No other changes with current Medications   Your physician recommends that you schedule a follow-up appointment in 4 months with Dr Ellyn Hack- 69 MIN

## 2014-10-09 NOTE — Progress Notes (Signed)
PATIENT: Kelly Olson. MRN: [id number] DOB: 01/19/1951 PCP: Irven Shelling, MD  Clinic Note: Chief Complaint  Patient presents with  . Follow-up    No cardiac complaints  . Coronary Artery Disease    s/p CABG  . Cardiomyopathy    HPI: Kelly Olson. is a 64 y.o. male with a PMH below who presents today for f/u s/p recent CABG x 4 06/04/2014. Uncomplicated post-op course.  Quit smoking the day of his Stress Test after brief discussion with me.  Seen by Dr. Cyndia Bent 07/15/2014: was walking ~ 2hrs/day w/o CP or dyspnea.  Has had some MSK pain. Seen by Cecilie Kicks, NP 07/13/2014:   Coronary artery bypass grafting x 4 --06/04/14  Left internal mammary graft to the OM  Right internal mammary graft to the LAD  SVG to diagonal  SVG to PDA  He was seen in clinic by Mr. Tarri Fuller on 06/01/2014 for exertional Chest pain, positive nuc.high risk with multivessel distribution ischemia. -- Severely ischemic Cardiomyoapthy with evidence of at least 2 vessel disease & EF of ~33%. Images are consistent with either infarction or severe resting ischemia in the LAD & RCA territory.   06/02/2014: Cardiac cath with severe multivessel CAD with mLAD-100%, mRCA 99%, dRCA 100%, mLCX 90% And EF 35-45%.  Prior to this - was walking~7.5 miles/day - but noted mild chest discomfort with HR 118-120 bpm.  Interval History: Viral presents today doing outstandingly well following his bypass surgery. He still says he gets a bit short of breath with walking, but this is getting gradually better.he thoroughly enjoyed cardiac rehabilitation, he says that he is up to > 6 METS @ [facility].  He actually plans to go on continuing with the maintenance program of cardiac rehabilitation. He actually went on his first long-distance work trip traveling [place] a few weeks ago. She did great without any issues. No heart failure symptoms. No edema. No palpitations rapid heartbeat. No angina.  The remainder of cardiac review  of systems is as follows: Cardiovascular ROS: positive for - dyspnea on exertion and hands/feet tingling negative for - chest pain, edema, irregular heartbeat, orthopnea, palpitations, paroxysmal nocturnal dyspnea, rapid heart rate, shortness of breath or syncope/near syncope, TIA/amaurosis fugax :   Past Medical History  Diagnosis Date  . Coronary artery disease, occlusive 06/02/2014    Multivessel CAD.mLAD-100%, mRCA 99%, dRCA 100%, mLCX 90% And EF 35-45%.  . 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 Ischemic cardiomyopathy 05/2014    Myoview: EF ~33% with "infarction vs. severe resting ischemia in LAD & RCA territory; b) EF by Cath: 35-45%.   . Hyperlipidemia with target LDL less than 70   . Former heavy tobacco smoker     Quit in April 2016     Prior Cardiac Evaluation and Past Surgical History: Past Surgical History  Procedure Laterality Date  . Tonsillectomy  1957  . Cardiac catheterization N/A 06/02/2014    Procedure: Left Heart Cath and Coronary Angiography;  Surgeon: Leonie Man, MD;  Location: [facility] INVASIVE CV LAB CUPID;  Service: Cardiovascular;  mLAD-100%, mRCA 99%, dRCA 100%, mLCX 90% And EF 35-45%.  . Coronary artery bypass graft N/A 06/04/2014    Procedure: CORONARY ARTERY BYPASS GRAFTING (CABG) times four using bilateral internal mammary arteries and EVH for left  leg saphenous vein;  Surgeon: Gaye Pollack, MD;  Location: [facility] OR;  Service: Open Heart Surgery;  LIMA-OM, RIMA-LAD, SVG-Diag, SVG-rPDA  .  Tee without cardioversion N/A 06/04/2014    Procedure: TRANSESOPHAGEAL ECHOCARDIOGRAM (TEE);  Surgeon: Gaye Pollack, MD;  Location: [facility];  Service: Open Heart Surgery;  Laterality: N/A;    No Known Allergies  Current Outpatient Prescriptions  Medication Sig Dispense Refill  . aspirin EC 325 MG tablet TAKE 1 TABLET BY MOUTH DAILY 30 tablet 5  . atorvastatin (LIPITOR) 80 MG tablet Take 1 tablet (80 mg total) by mouth daily at 6 PM. 90 tablet 3  .  carvedilol (COREG) 3.125 MG tablet Take 2 tablets (6.25 mg total) by mouth 2 (two) times daily. 360 tablet 3  . losartan (COZAAR) 25 MG tablet Take 12.5 mg by mouth daily.     No current facility-administered medications for this visit.   Social History  Substance Use Topics  . Smoking status: Former Smoker -- 0.80 packs/day for 46 years    Types: Cigarettes    Quit date: 05/29/2014  . Smokeless tobacco: Never Used  . Alcohol Use: 12.6 - 16.8 oz/week    7 Glasses of wine, 7 Cans of beer, 7-14 Standard drinks or equivalent per week    Family History. family history includes Cardiomyopathy in his father; Emphysema in his mother; Healthy in his sister and sister; Heart attack in his maternal grandfather; Heart disease in his paternal uncle.  ROS: A comprehensive Review of Systems - was performed Review of Systems  Constitutional: Negative for malaise/fatigue.  HENT: Negative for nosebleeds.   Respiratory: Positive for cough (Morning call occasionally.) and shortness of breath.   Cardiovascular: Negative for claudication.  Gastrointestinal: Negative for blood in stool and melena.  Genitourinary: Negative for hematuria.  Musculoskeletal: Negative for myalgias and falls.  Endo/Heme/Allergies: Does not bruise/bleed easily.  Psychiatric/Behavioral: Negative for depression.  All other systems reviewed and are negative.   PHYSICAL EXAM BP 110/60 mmHg  Pulse 72  Ht 6' (1.829 m)  Wt 152 lb 4.8 oz (69.083 kg)  BMI 20.65 kg/m2 General appearance: alert, cooperative, appears stated age, no distress and Relatively healthy appearing. Normal mood and affect. Neck: no adenopathy, no carotid bruit, no JVD and supple, symmetrical, trachea midline Lungs: clear to auscultation bilaterally, normal percussion bilaterally and Nonlabored, the abdomen. Mild interstitial sounds Heart: regular rate and rhythm, S1, S2 normal, no murmur, click, rub or gallop, normal apical impulse and Well-healed sternotomy  scar Abdomen: soft, non-tender; bowel sounds normal; no masses,  no organomegaly Extremities: extremities normal, atraumatic, no cyanosis or edema Pulses: 2+ and symmetric Skin: Skin color, texture, turgor normal. No rashes or lesions Neurologic: Alert and oriented X 3, normal strength and tone. Normal symmetric reflexes. Normal coordination and gait   Adult ECG Report - n/a  Recent Labs: Lab Results  Component Value Date   CHOL 158 06/04/2014   HDL 54 06/04/2014   LDLCALC 88 06/04/2014   TRIG 78 06/04/2014   CHOLHDL 2.9 06/04/2014   Lab Results  Component Value Date   CREATININE 0.87 07/07/2014   Lab Results  Component Value Date   K 4.2 07/07/2014    ASSESSMENT / PLAN: Doing quite well status post CABG. No active partly her symptoms despite reduced ejection fraction.  Problem List Items Addressed This Visit    Angina, class II (Chronic)    No further symptoms status post CABG. Doing well in cardiac rehabilitation.      Relevant Medications   losartan (COZAAR) 25 MG tablet   Other Relevant Orders   ECHOCARDIOGRAM COMPLETE   Cardiomyopathy, ischemic: EF ~35-45% by LV  Gram - Primary (Chronic)    No active heart or symptoms. He still has some mild exertional dyspnea probably related to his history of smoking and heart failure without any PND orthopnea or edema. He is on beta blocker and ARB for that reason. No need for diuretic. Plan: Postop echocardiogram ordered.      Relevant Medications   losartan (COZAAR) 25 MG tablet   Other Relevant Orders   ECHOCARDIOGRAM COMPLETE   Coronary artery disease involving native coronary artery with angina pectoris (Chronic)    Now he thinks that he probably was having symptoms that gradually gotten worse over the past several months. Is no clear indication of an episode that he may have had an MI. Hopefully the Myoview showing infarct versus severe resting ischemia was more consistent with rest ischemia by MI.   He does need an  echocardiogram to see if his EF is improved. Presence of severe regional wall motion abnormalities would argue in favor of prior infarct as opposed to rest ischemia. He is on beta blocker and ARB and low-dose as well as high-dose statin. He has quit smoking. Okay to reduce aspirin to 81 mg daily.      Relevant Medications   losartan (COZAAR) 25 MG tablet   Other Relevant Orders   ECHOCARDIOGRAM COMPLETE   Hyperlipidemia with target LDL less than 70 (Chronic)    On high dose statin. Recheck lipids at next visit if not already checked by PCP.      Relevant Medications   losartan (COZAAR) 25 MG tablet   Other Relevant Orders   ECHOCARDIOGRAM COMPLETE   Tobacco abuse - PLANS TO QUIT (Chronic)    Smoking cessation instruction/counseling given:  commended patient for quitting and reviewed strategies for preventing relapses.      Relevant Orders   ECHOCARDIOGRAM COMPLETE     OK to continue with Cardiac Rehab Maintenance Program  Meds ordered this encounter  Medications  . losartan (COZAAR) 25 MG tablet    Sig: Take 12.5 mg by mouth daily.    Followup: 4 months  DAVID W. Ellyn Hack, M.D., M.S. Interventional Cardiolgy [facility]

## 2014-10-11 ENCOUNTER — Encounter: Payer: Self-pay | Admitting: Cardiology

## 2014-10-11 NOTE — Assessment & Plan Note (Signed)
No further symptoms status post CABG. Doing well in cardiac rehabilitation.

## 2014-10-11 NOTE — Assessment & Plan Note (Signed)
Smoking cessation instruction/counseling given:  commended patient for quitting and reviewed strategies for preventing relapses 

## 2014-10-11 NOTE — Assessment & Plan Note (Signed)
Now he thinks that he probably was having symptoms that gradually gotten worse over the past several months. Is no clear indication of an episode that he may have had an MI. Hopefully the Myoview showing infarct versus severe resting ischemia was more consistent with rest ischemia by MI.   He does need an echocardiogram to see if his EF is improved. Presence of severe regional wall motion abnormalities would argue in favor of prior infarct as opposed to rest ischemia. He is on beta blocker and ARB and low-dose as well as high-dose statin. He has quit smoking. Okay to reduce aspirin to 81 mg daily.

## 2014-10-11 NOTE — Assessment & Plan Note (Signed)
On high dose statin. Recheck lipids at next visit if not already checked by PCP.

## 2014-10-11 NOTE — Assessment & Plan Note (Signed)
No active heart or symptoms. He still has some mild exertional dyspnea probably related to his history of smoking and heart failure without any PND orthopnea or edema. He is on beta blocker and ARB for that reason. No need for diuretic. Plan: Postop echocardiogram ordered.

## 2014-10-12 ENCOUNTER — Encounter (HOSPITAL_COMMUNITY)
Admission: RE | Admit: 2014-10-12 | Discharge: 2014-10-12 | Disposition: A | Discharge status: Home / Self Care | Payer: BLUE CROSS/BLUE SHIELD | Source: Ambulatory Visit | Attending: Cardiology | Admitting: Cardiology

## 2014-10-14 ENCOUNTER — Encounter (HOSPITAL_COMMUNITY)
Admission: RE | Admit: 2014-10-14 | Discharge: 2014-10-14 | Disposition: A | Discharge status: Home / Self Care | Payer: BLUE CROSS/BLUE SHIELD | Source: Ambulatory Visit | Attending: Cardiology | Admitting: Cardiology

## 2014-10-14 DIAGNOSIS — Z48812 Encounter for surgical aftercare following surgery on the circulatory system: Secondary | ICD-10-CM | POA: Diagnosis not present

## 2014-10-14 NOTE — Progress Notes (Signed)
Psychosocial Assessment Pt recently returned to from [place] due to work.  Pt is planning on personal vacation for a week with his wife starting this Friday. Pt is excited for the time away. No further needs identified and no further intervention needed. Cherre Huger, BSN

## 2014-10-16 ENCOUNTER — Encounter (HOSPITAL_COMMUNITY): Payer: BLUE CROSS/BLUE SHIELD

## 2014-10-19 ENCOUNTER — Encounter (HOSPITAL_COMMUNITY): Payer: BLUE CROSS/BLUE SHIELD

## 2014-10-21 ENCOUNTER — Encounter (HOSPITAL_COMMUNITY): Payer: BLUE CROSS/BLUE SHIELD

## 2014-10-26 ENCOUNTER — Encounter (HOSPITAL_COMMUNITY)
Admission: RE | Admit: 2014-10-26 | Discharge: 2014-10-26 | Disposition: A | Discharge status: Home / Self Care | Payer: BLUE CROSS/BLUE SHIELD | Source: Ambulatory Visit | Attending: Cardiology | Admitting: Cardiology

## 2014-10-26 DIAGNOSIS — Z48812 Encounter for surgical aftercare following surgery on the circulatory system: Secondary | ICD-10-CM | POA: Diagnosis not present

## 2014-10-28 ENCOUNTER — Encounter (HOSPITAL_COMMUNITY): Payer: BLUE CROSS/BLUE SHIELD

## 2014-10-29 ENCOUNTER — Ambulatory Visit (HOSPITAL_COMMUNITY): Payer: BLUE CROSS/BLUE SHIELD | Attending: Cardiology

## 2014-10-29 ENCOUNTER — Other Ambulatory Visit: Payer: Self-pay

## 2014-10-29 DIAGNOSIS — I255 Ischemic cardiomyopathy: Secondary | ICD-10-CM | POA: Insufficient documentation

## 2014-10-29 DIAGNOSIS — F17200 Nicotine dependence, unspecified, uncomplicated: Secondary | ICD-10-CM | POA: Insufficient documentation

## 2014-10-29 DIAGNOSIS — I209 Angina pectoris, unspecified: Secondary | ICD-10-CM

## 2014-10-29 DIAGNOSIS — I340 Nonrheumatic mitral (valve) insufficiency: Secondary | ICD-10-CM | POA: Diagnosis not present

## 2014-10-29 DIAGNOSIS — I208 Other forms of angina pectoris: Secondary | ICD-10-CM

## 2014-10-29 DIAGNOSIS — Z720 Tobacco use: Secondary | ICD-10-CM

## 2014-10-29 DIAGNOSIS — I25119 Atherosclerotic heart disease of native coronary artery with unspecified angina pectoris: Secondary | ICD-10-CM

## 2014-10-29 DIAGNOSIS — I517 Cardiomegaly: Secondary | ICD-10-CM | POA: Diagnosis not present

## 2014-10-29 DIAGNOSIS — E785 Hyperlipidemia, unspecified: Secondary | ICD-10-CM | POA: Diagnosis not present

## 2014-10-30 ENCOUNTER — Encounter (HOSPITAL_COMMUNITY)
Admission: RE | Admit: 2014-10-30 | Discharge: 2014-10-30 | Disposition: A | Discharge status: Home / Self Care | Payer: BLUE CROSS/BLUE SHIELD | Source: Ambulatory Visit | Attending: Cardiology | Admitting: Cardiology

## 2014-10-30 DIAGNOSIS — Z48812 Encounter for surgical aftercare following surgery on the circulatory system: Secondary | ICD-10-CM | POA: Diagnosis not present

## 2014-11-02 ENCOUNTER — Encounter (HOSPITAL_COMMUNITY)
Admission: RE | Admit: 2014-11-02 | Discharge: 2014-11-02 | Disposition: A | Discharge status: Home / Self Care | Payer: BLUE CROSS/BLUE SHIELD | Source: Ambulatory Visit | Attending: Cardiology | Admitting: Cardiology

## 2014-11-02 DIAGNOSIS — Z48812 Encounter for surgical aftercare following surgery on the circulatory system: Secondary | ICD-10-CM | POA: Insufficient documentation

## 2014-11-02 DIAGNOSIS — Z951 Presence of aortocoronary bypass graft: Secondary | ICD-10-CM | POA: Diagnosis not present

## 2014-11-02 NOTE — Progress Notes (Signed)
60 day Psychosocial Assessment  Pt is excited to share information regarding the results of recent echo.  Pt with increase in EF 50-55%.  Pt is pleased by this news and feels certain exercising contributed to the overall increase.  Pt will graduate on this Friday and will continue with the Maintenance program here at [facility].  No needs identified, no further intervention warranted. Cherre Huger, BSN

## 2014-11-04 ENCOUNTER — Encounter (HOSPITAL_COMMUNITY)
Admission: RE | Admit: 2014-11-04 | Discharge: 2014-11-04 | Disposition: A | Discharge status: Home / Self Care | Payer: BLUE CROSS/BLUE SHIELD | Source: Ambulatory Visit | Attending: Cardiology | Admitting: Cardiology

## 2014-11-04 DIAGNOSIS — Z48812 Encounter for surgical aftercare following surgery on the circulatory system: Secondary | ICD-10-CM | POA: Diagnosis not present

## 2014-11-06 ENCOUNTER — Encounter (HOSPITAL_COMMUNITY)
Admission: RE | Admit: 2014-11-06 | Discharge: 2014-11-06 | Disposition: A | Discharge status: Home / Self Care | Payer: BLUE CROSS/BLUE SHIELD | Source: Ambulatory Visit | Attending: Cardiology | Admitting: Cardiology

## 2014-11-06 DIAGNOSIS — Z48812 Encounter for surgical aftercare following surgery on the circulatory system: Secondary | ICD-10-CM | POA: Diagnosis not present

## 2014-11-06 NOTE — Progress Notes (Signed)
Pt graduated from cardiac rehab program today with completion of 36 exercise sessions in Phase II. Pt maintained good attendance to education classes and exercise.  Pt  progressed nicely during his participation in rehab as evidenced by increased MET level.  Pt increased his MET level from 4.0 to 5.7.  Medication list reconciled. Repeat  PHQ score- 0 .  Pt showed improvement and increase score in QOL survey results.  Pt feels more energetic and never realized how bad he really felt until he felt better. Pt has made significant lifestyle changes and should be commended for his success. Pt feels he has achieved his goals during cardiac rehab. Pt has gained strength and has increased past where he was at before his event.  Pt stated "he never knew how bad he really felt until he felt really good"!  Pt long tem goal is to learn limits to push.  Pt understands his target heart reate and rate of perceived exertion and where he should be with exercise.   Pt plans to continue exercise in cardiac maintenance program. We are delighted to have pt continue to exercise here at [facility]. Cherre Huger, BSN

## 2014-11-09 ENCOUNTER — Encounter (HOSPITAL_COMMUNITY): Payer: BLUE CROSS/BLUE SHIELD

## 2014-11-11 ENCOUNTER — Encounter (HOSPITAL_COMMUNITY): Payer: BLUE CROSS/BLUE SHIELD

## 2014-11-13 ENCOUNTER — Encounter (HOSPITAL_COMMUNITY): Payer: BLUE CROSS/BLUE SHIELD

## 2014-11-13 ENCOUNTER — Encounter (HOSPITAL_COMMUNITY): Payer: Self-pay

## 2014-11-13 DIAGNOSIS — Z951 Presence of aortocoronary bypass graft: Secondary | ICD-10-CM | POA: Insufficient documentation

## 2014-11-16 ENCOUNTER — Encounter (HOSPITAL_COMMUNITY)
Admission: RE | Admit: 2014-11-16 | Discharge: 2014-11-16 | Disposition: A | Discharge status: Home / Self Care | Payer: Self-pay | Source: Ambulatory Visit | Attending: Cardiology | Admitting: Cardiology

## 2014-11-18 ENCOUNTER — Encounter (HOSPITAL_COMMUNITY)
Admission: RE | Admit: 2014-11-18 | Discharge: 2014-11-18 | Disposition: A | Discharge status: Home / Self Care | Payer: Self-pay | Source: Ambulatory Visit | Attending: Cardiology | Admitting: Cardiology

## 2014-11-19 ENCOUNTER — Encounter (HOSPITAL_COMMUNITY)
Admission: RE | Admit: 2014-11-19 | Discharge: 2014-11-19 | Disposition: A | Discharge status: Home / Self Care | Payer: BLUE CROSS/BLUE SHIELD | Source: Ambulatory Visit | Attending: Cardiology | Admitting: Cardiology

## 2014-11-20 ENCOUNTER — Encounter (HOSPITAL_COMMUNITY): Payer: Self-pay

## 2014-11-23 ENCOUNTER — Encounter (HOSPITAL_COMMUNITY)
Admission: RE | Admit: 2014-11-23 | Discharge: 2014-11-23 | Disposition: A | Discharge status: Home / Self Care | Payer: Self-pay | Source: Ambulatory Visit | Attending: Cardiology | Admitting: Cardiology

## 2014-11-24 ENCOUNTER — Encounter (HOSPITAL_COMMUNITY)
Admission: RE | Admit: 2014-11-24 | Discharge: 2014-11-24 | Disposition: A | Discharge status: Home / Self Care | Payer: Self-pay | Source: Ambulatory Visit | Attending: Cardiology | Admitting: Cardiology

## 2014-11-25 ENCOUNTER — Encounter (HOSPITAL_COMMUNITY)
Admission: RE | Admit: 2014-11-25 | Discharge: 2014-11-25 | Disposition: A | Discharge status: Home / Self Care | Payer: Self-pay | Source: Ambulatory Visit | Attending: Cardiology | Admitting: Cardiology

## 2014-11-27 ENCOUNTER — Encounter (HOSPITAL_COMMUNITY): Payer: Self-pay

## 2014-11-30 ENCOUNTER — Encounter (HOSPITAL_COMMUNITY)
Admission: RE | Admit: 2014-11-30 | Discharge: 2014-11-30 | Disposition: A | Discharge status: Home / Self Care | Payer: Self-pay | Source: Ambulatory Visit | Attending: Cardiology | Admitting: Cardiology

## 2014-12-02 ENCOUNTER — Encounter (HOSPITAL_COMMUNITY)
Admission: RE | Admit: 2014-12-02 | Discharge: 2014-12-02 | Disposition: A | Discharge status: Home / Self Care | Payer: Self-pay | Source: Ambulatory Visit | Attending: Cardiology | Admitting: Cardiology

## 2014-12-02 DIAGNOSIS — Z951 Presence of aortocoronary bypass graft: Secondary | ICD-10-CM | POA: Insufficient documentation

## 2014-12-02 DIAGNOSIS — Z720 Tobacco use: Secondary | ICD-10-CM | POA: Insufficient documentation

## 2014-12-03 ENCOUNTER — Encounter (HOSPITAL_COMMUNITY)
Admission: RE | Admit: 2014-12-03 | Discharge: 2014-12-03 | Disposition: A | Discharge status: Home / Self Care | Payer: Self-pay | Source: Ambulatory Visit | Attending: Cardiology | Admitting: Cardiology

## 2014-12-04 ENCOUNTER — Encounter (HOSPITAL_COMMUNITY): Payer: Self-pay

## 2014-12-07 ENCOUNTER — Encounter (HOSPITAL_COMMUNITY)
Admission: RE | Admit: 2014-12-07 | Discharge: 2014-12-07 | Disposition: A | Discharge status: Home / Self Care | Payer: Self-pay | Source: Ambulatory Visit | Attending: Cardiology | Admitting: Cardiology

## 2014-12-08 ENCOUNTER — Encounter (HOSPITAL_COMMUNITY)
Admission: RE | Admit: 2014-12-08 | Discharge: 2014-12-08 | Disposition: A | Discharge status: Home / Self Care | Payer: Self-pay | Source: Ambulatory Visit | Attending: Cardiology | Admitting: Cardiology

## 2014-12-09 ENCOUNTER — Encounter (HOSPITAL_COMMUNITY)
Admission: RE | Admit: 2014-12-09 | Discharge: 2014-12-09 | Disposition: A | Discharge status: Home / Self Care | Payer: Self-pay | Source: Ambulatory Visit | Attending: Cardiology | Admitting: Cardiology

## 2014-12-10 ENCOUNTER — Telehealth: Payer: Self-pay | Admitting: Cardiology

## 2014-12-10 NOTE — Telephone Encounter (Signed)
Left message to call back To have dentist call back with details of surgery to move forward.

## 2014-12-10 NOTE — Telephone Encounter (Signed)
Patient is due for dental surgery and he states he had heart surgery earlier this year.  He wants to know if there are any precautions that he needs to take.

## 2014-12-10 NOTE — Telephone Encounter (Signed)
Pt wanted to see if we had received release from periodontist for upcoming procedure.  Pt stated he will call their office and have them fax it to our office. Gave him our fax number. Pt verbalized understanding and no questions at this time.

## 2014-12-11 ENCOUNTER — Encounter (HOSPITAL_COMMUNITY): Payer: Self-pay

## 2014-12-14 ENCOUNTER — Encounter (HOSPITAL_COMMUNITY): Payer: Self-pay

## 2014-12-16 ENCOUNTER — Encounter (HOSPITAL_COMMUNITY): Payer: Self-pay

## 2014-12-18 ENCOUNTER — Encounter (HOSPITAL_COMMUNITY): Payer: Self-pay

## 2014-12-21 ENCOUNTER — Encounter (HOSPITAL_COMMUNITY)
Admission: RE | Admit: 2014-12-21 | Discharge: 2014-12-21 | Disposition: A | Discharge status: Home / Self Care | Payer: Self-pay | Source: Ambulatory Visit | Attending: Cardiology | Admitting: Cardiology

## 2014-12-22 ENCOUNTER — Telehealth: Payer: Self-pay | Admitting: *Deleted

## 2014-12-22 ENCOUNTER — Telehealth: Payer: Self-pay | Admitting: Cardiology

## 2014-12-22 NOTE — Telephone Encounter (Signed)
Pt is calling in wanting to speak with Kelly Olson about if he was cleared to have his procedure at the dentist office. Please f/u with him  Thanks

## 2014-12-22 NOTE — Telephone Encounter (Signed)
He can be on ASA 81 mg --> OK to hold for 2-3 days.  Leonie Man, M.D., M.S. Interventional Cardiologist   Pager # [phone]

## 2014-12-22 NOTE — Telephone Encounter (Signed)
Medical clearance for cleared for treatment(periodontal surgery) with no changes to their medication per Dr Ellyn Hack

## 2014-12-22 NOTE — Telephone Encounter (Signed)
I reviewed clearance note w/ patient. Pt states there is also a tooth extraction in addition to the laser peridontal surgery that he is having done. Wanted to make sure he should still be on ASA 325 mg daily.  Advised that usually we don't hold meds for single extraction, but will double check at pt's request. He expressed thanks for call.  Routed to [facility], Dr. Ellyn Hack.

## 2014-12-23 ENCOUNTER — Encounter (HOSPITAL_COMMUNITY)
Admission: RE | Admit: 2014-12-23 | Discharge: 2014-12-23 | Disposition: A | Discharge status: Home / Self Care | Payer: Self-pay | Source: Ambulatory Visit | Attending: Cardiology | Admitting: Cardiology

## 2014-12-23 NOTE — Telephone Encounter (Signed)
SPOKE TO PATIENT. PATIENT VOICED UNDERSTANDING

## 2014-12-28 ENCOUNTER — Encounter (HOSPITAL_COMMUNITY)
Admission: RE | Admit: 2014-12-28 | Discharge: 2014-12-28 | Disposition: A | Discharge status: Home / Self Care | Payer: Self-pay | Source: Ambulatory Visit | Attending: Cardiology | Admitting: Cardiology

## 2014-12-30 ENCOUNTER — Encounter (HOSPITAL_COMMUNITY)
Admission: RE | Admit: 2014-12-30 | Discharge: 2014-12-30 | Disposition: A | Discharge status: Home / Self Care | Payer: Self-pay | Source: Ambulatory Visit | Attending: Cardiology | Admitting: Cardiology

## 2015-01-01 ENCOUNTER — Encounter (HOSPITAL_COMMUNITY): Payer: Self-pay

## 2015-01-01 DIAGNOSIS — Z951 Presence of aortocoronary bypass graft: Secondary | ICD-10-CM | POA: Insufficient documentation

## 2015-01-01 DIAGNOSIS — Z720 Tobacco use: Secondary | ICD-10-CM | POA: Insufficient documentation

## 2015-01-04 ENCOUNTER — Encounter (HOSPITAL_COMMUNITY)
Admission: RE | Admit: 2015-01-04 | Discharge: 2015-01-04 | Disposition: A | Discharge status: Home / Self Care | Payer: Self-pay | Source: Ambulatory Visit | Attending: Cardiology | Admitting: Cardiology

## 2015-01-05 ENCOUNTER — Encounter (HOSPITAL_COMMUNITY)
Admission: RE | Admit: 2015-01-05 | Discharge: 2015-01-05 | Disposition: A | Discharge status: Home / Self Care | Payer: Self-pay | Source: Ambulatory Visit | Attending: Cardiology | Admitting: Cardiology

## 2015-01-06 ENCOUNTER — Encounter (HOSPITAL_COMMUNITY): Payer: Self-pay

## 2015-01-08 ENCOUNTER — Encounter (HOSPITAL_COMMUNITY)
Admission: RE | Admit: 2015-01-08 | Discharge: 2015-01-08 | Disposition: A | Discharge status: Home / Self Care | Payer: Self-pay | Source: Ambulatory Visit | Attending: Cardiology | Admitting: Cardiology

## 2015-01-11 ENCOUNTER — Encounter (HOSPITAL_COMMUNITY)
Admission: RE | Admit: 2015-01-11 | Discharge: 2015-01-11 | Disposition: A | Discharge status: Home / Self Care | Payer: Self-pay | Source: Ambulatory Visit | Attending: Cardiology | Admitting: Cardiology

## 2015-01-12 ENCOUNTER — Encounter (HOSPITAL_COMMUNITY)
Admission: RE | Admit: 2015-01-12 | Discharge: 2015-01-12 | Disposition: A | Discharge status: Home / Self Care | Payer: Self-pay | Source: Ambulatory Visit | Attending: Cardiology | Admitting: Cardiology

## 2015-01-13 ENCOUNTER — Encounter (HOSPITAL_COMMUNITY): Payer: Self-pay

## 2015-01-15 ENCOUNTER — Encounter (HOSPITAL_COMMUNITY): Payer: Self-pay

## 2015-01-18 ENCOUNTER — Encounter (HOSPITAL_COMMUNITY): Payer: Self-pay

## 2015-01-19 ENCOUNTER — Encounter (HOSPITAL_COMMUNITY)
Admission: RE | Admit: 2015-01-19 | Discharge: 2015-01-19 | Disposition: A | Discharge status: Home / Self Care | Payer: Self-pay | Source: Ambulatory Visit | Attending: Cardiology | Admitting: Cardiology

## 2015-01-20 ENCOUNTER — Encounter (HOSPITAL_COMMUNITY)
Admission: RE | Admit: 2015-01-20 | Discharge: 2015-01-20 | Disposition: A | Discharge status: Home / Self Care | Payer: Self-pay | Source: Ambulatory Visit | Attending: Cardiology | Admitting: Cardiology

## 2015-01-22 ENCOUNTER — Encounter (HOSPITAL_COMMUNITY)
Admission: RE | Admit: 2015-01-22 | Discharge: 2015-01-22 | Disposition: A | Discharge status: Home / Self Care | Payer: Self-pay | Source: Ambulatory Visit | Attending: Cardiology | Admitting: Cardiology

## 2015-01-26 ENCOUNTER — Encounter (HOSPITAL_COMMUNITY)
Admission: RE | Admit: 2015-01-26 | Discharge: 2015-01-26 | Disposition: A | Discharge status: Home / Self Care | Payer: Self-pay | Source: Ambulatory Visit | Attending: Cardiology | Admitting: Cardiology

## 2015-01-27 ENCOUNTER — Encounter (HOSPITAL_COMMUNITY)
Admission: RE | Admit: 2015-01-27 | Discharge: 2015-01-27 | Disposition: A | Discharge status: Home / Self Care | Payer: Self-pay | Source: Ambulatory Visit | Attending: Cardiology | Admitting: Cardiology

## 2015-01-29 ENCOUNTER — Encounter (HOSPITAL_COMMUNITY): Payer: Self-pay

## 2015-02-02 ENCOUNTER — Encounter (HOSPITAL_COMMUNITY)
Admission: RE | Admit: 2015-02-02 | Discharge: 2015-02-02 | Disposition: A | Discharge status: Home / Self Care | Payer: Self-pay | Source: Ambulatory Visit | Attending: Cardiology | Admitting: Cardiology

## 2015-02-02 DIAGNOSIS — Z720 Tobacco use: Secondary | ICD-10-CM | POA: Insufficient documentation

## 2015-02-02 DIAGNOSIS — Z951 Presence of aortocoronary bypass graft: Secondary | ICD-10-CM | POA: Insufficient documentation

## 2015-02-03 ENCOUNTER — Encounter (HOSPITAL_COMMUNITY)
Admission: RE | Admit: 2015-02-03 | Discharge: 2015-02-03 | Disposition: A | Discharge status: Home / Self Care | Payer: Self-pay | Source: Ambulatory Visit | Attending: Cardiology | Admitting: Cardiology

## 2015-02-05 ENCOUNTER — Encounter (HOSPITAL_COMMUNITY): Payer: Self-pay

## 2015-02-08 ENCOUNTER — Encounter (HOSPITAL_COMMUNITY): Payer: Self-pay

## 2015-02-09 ENCOUNTER — Encounter (HOSPITAL_COMMUNITY)
Admission: RE | Admit: 2015-02-09 | Discharge: 2015-02-09 | Disposition: A | Discharge status: Home / Self Care | Payer: Self-pay | Source: Ambulatory Visit | Attending: Cardiology | Admitting: Cardiology

## 2015-02-10 ENCOUNTER — Encounter (HOSPITAL_COMMUNITY): Payer: Self-pay

## 2015-02-10 ENCOUNTER — Encounter (HOSPITAL_COMMUNITY)
Admission: RE | Admit: 2015-02-10 | Discharge: 2015-02-10 | Disposition: A | Discharge status: Home / Self Care | Payer: Self-pay | Source: Ambulatory Visit | Attending: Cardiology | Admitting: Cardiology

## 2015-02-11 ENCOUNTER — Encounter (HOSPITAL_COMMUNITY): Payer: Self-pay

## 2015-02-12 ENCOUNTER — Encounter (HOSPITAL_COMMUNITY)
Admission: RE | Admit: 2015-02-12 | Discharge: 2015-02-12 | Disposition: A | Discharge status: Home / Self Care | Payer: Self-pay | Source: Ambulatory Visit | Attending: Cardiology | Admitting: Cardiology

## 2015-02-12 ENCOUNTER — Encounter (HOSPITAL_COMMUNITY): Payer: Self-pay

## 2015-02-15 ENCOUNTER — Encounter (HOSPITAL_COMMUNITY): Payer: Self-pay

## 2015-02-16 ENCOUNTER — Encounter (HOSPITAL_COMMUNITY)
Admission: RE | Admit: 2015-02-16 | Discharge: 2015-02-16 | Disposition: A | Discharge status: Home / Self Care | Payer: Self-pay | Source: Ambulatory Visit | Attending: Cardiology | Admitting: Cardiology

## 2015-02-17 ENCOUNTER — Encounter (HOSPITAL_COMMUNITY): Payer: Self-pay

## 2015-02-18 ENCOUNTER — Encounter (HOSPITAL_COMMUNITY): Payer: Self-pay

## 2015-02-19 ENCOUNTER — Encounter (HOSPITAL_COMMUNITY): Payer: Self-pay

## 2015-02-22 ENCOUNTER — Encounter (HOSPITAL_COMMUNITY)
Admission: RE | Admit: 2015-02-22 | Discharge: 2015-02-22 | Disposition: A | Discharge status: Home / Self Care | Payer: Self-pay | Source: Ambulatory Visit | Attending: Cardiology | Admitting: Cardiology

## 2015-02-22 ENCOUNTER — Encounter (HOSPITAL_COMMUNITY): Payer: Self-pay

## 2015-02-23 ENCOUNTER — Encounter (HOSPITAL_COMMUNITY): Payer: Self-pay

## 2015-02-24 ENCOUNTER — Encounter (HOSPITAL_COMMUNITY): Payer: Self-pay

## 2015-02-25 ENCOUNTER — Encounter (HOSPITAL_COMMUNITY): Payer: Self-pay

## 2015-02-26 ENCOUNTER — Encounter (HOSPITAL_COMMUNITY): Payer: Self-pay

## 2015-03-01 ENCOUNTER — Encounter (HOSPITAL_COMMUNITY): Payer: Self-pay

## 2015-03-01 ENCOUNTER — Ambulatory Visit (INDEPENDENT_AMBULATORY_CARE_PROVIDER_SITE_OTHER): Payer: BLUE CROSS/BLUE SHIELD | Admitting: Cardiology

## 2015-03-01 ENCOUNTER — Encounter: Payer: Self-pay | Admitting: Cardiology

## 2015-03-01 VITALS — BP 120/84 | HR 67 | Ht 72.0 in | Wt 155.7 lb

## 2015-03-01 DIAGNOSIS — E785 Hyperlipidemia, unspecified: Secondary | ICD-10-CM | POA: Diagnosis not present

## 2015-03-01 DIAGNOSIS — I25759 Atherosclerosis of native coronary artery of transplanted heart with unspecified angina pectoris: Secondary | ICD-10-CM

## 2015-03-01 DIAGNOSIS — I255 Ischemic cardiomyopathy: Secondary | ICD-10-CM

## 2015-03-01 DIAGNOSIS — I209 Angina pectoris, unspecified: Secondary | ICD-10-CM | POA: Diagnosis not present

## 2015-03-01 DIAGNOSIS — Z87891 Personal history of nicotine dependence: Secondary | ICD-10-CM

## 2015-03-01 NOTE — Progress Notes (Signed)
PATIENT: Kelly Olson. MRN: [id number] DOB: [birth year] PCP: Irven Shelling, MD  Clinic Note: Chief Complaint  Patient presents with  . Follow-up  . Coronary Artery Disease    CABG in May 2016    HPI: Kelly Olson. is a 65 y.o. male with a PMH below who presents today for f/u s/p recent CABG x 4 06/04/2014. Uncomplicated post-op course.  Quit smoking the day of his Stress Test after brief discussion with me. He initially had reduced ejection fraction, however follow-up echocardiogram is showing improved EF. He was seen in clinic by Mr. Tarri Fuller on 06/01/2014 for exertional Chest pain.Prior to this - was walking~7.5 miles/day - but noted mild chest discomfort with HR 118-120 bpm.  Positive High Risk Nuclear Stress Test with multivessel distribution ischemia. -- Severely ischemic Cardiomyoapthy with evidence of at least 2 vessel disease & EF of ~33%. Images are consistent with either infarction or severe resting ischemia in the LAD & RCA territory. 06/02/2014: Cardiac cath with severe multivessel CAD with mLAD-100%, mRCA 99%, dRCA 100%, mLCX 90% And EF 35-45%. Coronary artery bypass grafting x 4 --06/04/14  Left internal mammary graft to the OM  Right internal mammary graft to the LAD  SVG to diagonal  SVG to PDA  Last seen Sept 2016 - was doing well.  Studies reviewed Echo 10/2014:  - Left ventricle: The cavity size was normal. There was mild concentric hypertrophy. Systolic function was normal. The estimated ejection fraction was in the range of 50% to 55%. Mild hypokinesis of the basal anteroseptal myocardium. Doppler parameters are consistent with abnormal left ventricular relaxation (grade 1 diastolic dysfunction). - Mitral valve: There was trivial regurgitation. - Right ventricle: The cavity size was normal. Wall thickness was normal. Systolic function was mildly reduced.  Doing Maintenance CRH & walking other days. -- His wife was just diagnosed with  breast cancer and will likely have some lumpectomy as well as XRT plus minus chemotherapy. She was planning on coming to see me in February, however may postpone until she has resolved her breast cancer issues.  Interval History: Kelly Olson presents today doing outstandingly well following his bypass surgery. He still says he gets a bit short of breath with walking, but this is getting gradually better. He thoroughly enjoyed cardiac rehabilitation, he has now started the maintenance program. He says that he is up to > 6 METS @ [place]. He does note that he is not really able to get his heart rate up to watch, but really does not notice any fatigue or dyspnea. 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 No heart failure symptoms. No edema. No palpitations rapid heartbeat. No angina.  The remainder of cardiac review of systems is as follows: Cardiovascular ROS: positive for - dyspnea on exertion and hands/feet tingling negative for - chest pain, edema, irregular heartbeat, orthopnea, palpitations, paroxysmal nocturnal dyspnea, rapid heart rate, shortness of breath or syncope/near syncope, TIA/amaurosis fugax :  After effects of smoking getting better - less coughing, no wheeze.  Just sinus drainage.  ROS: A comprehensive Review of Systems - was performed Review of Systems  Constitutional: Negative for malaise/fatigue.  HENT: Negative for nosebleeds.   Respiratory: Positive for cough (Morning call occasionally. -- Notably improving) and shortness of breath. Negative for wheezing.   Cardiovascular: Negative for claudication.  Gastrointestinal: Negative for blood in stool and melena.  Genitourinary: Negative for hematuria.  Musculoskeletal: Negative for myalgias and falls.  Neurological: Negative.  Negative for dizziness.  Endo/Heme/Allergies: Does not bruise/bleed easily.  Psychiatric/Behavioral: Negative for depression. The patient has insomnia (ccasionally.). The patient is not nervous/anxious.     All other systems reviewed and are negative.  Past Medical History  Diagnosis Date  . Coronary artery disease, occlusive 06/02/2014    Multivessel CAD.mLAD-100%, mRCA 99%, dRCA 100%, mLCX 90% And EF 35-45%.  . 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 Ischemic cardiomyopathy 05/2014    Myoview: EF ~33% with "infarction vs. severe resting ischemia in LAD & RCA territory; b) EF by Cath: 35-45%.   . Hyperlipidemia with target LDL less than 70   . Former heavy tobacco smoker     Quit in April 2016    Past Surgical History  Procedure Laterality Date  . Tonsillectomy  1957  . Tee without cardioversion N/A 06/04/2014    Procedure: TRANSESOPHAGEAL ECHOCARDIOGRAM (TEE);  Surgeon: Gaye Pollack, MD;  Location: [facility];  Service: Open Heart Surgery;  Laterality: N/A;  . Cardiac catheterization N/A 06/02/2014    Procedure: Left Heart Cath and Coronary Angiography;  Surgeon: Leonie Man, MD;  Location: [facility] INVASIVE CV LAB CUPID;  Service: Cardiovascular;  mLAD-100%, mRCA 99%, dRCA 100%, mLCX 90% And EF 35-45%.  . Coronary artery bypass graft N/A 06/04/2014    Procedure: CORONARY ARTERY BYPASS GRAFTING (CABG) times four using bilateral internal mammary arteries and EVH for left  leg saphenous vein;  Surgeon: Gaye Pollack, MD;  Location: [facility];  Service: Open Heart Surgery;  LIMA-OM, RIMA-LAD, SVG-Diag, SVG-rPDA  . Transthoracic echocardiogram  November 2016    Mild concentric LVH. EF 50-55% with mild HK of basal anteroseptal myocardium. GR 1 DD.   No Known Allergies   Prior to Admission medications   Medication Sig Start Date End Date Taking? Authorizing Provider  aspirin EC 325 MG tablet TAKE 1 TABLET BY MOUTH DAILY 07/07/14  Yes Leonie Man, MD  atorvastatin (LIPITOR) 80 MG tablet Take 1 tablet (80 mg total) by mouth daily at 6 PM. 07/13/14  Yes Isaiah Serge, NP  carvedilol (COREG) 3.125 MG tablet Take 2 tablets (6.25 mg total) by mouth 2 (two) times daily. 07/13/14  Yes  Isaiah Serge, NP  losartan (COZAAR) 25 MG tablet Take 12.5 mg by mouth daily.   Yes Historical Provider, MD  PEROXYL 1.5 % SOLN RINSE TWICE DAILY 12/28/14  Yes Historical Provider, MD    Family History  Problem Relation Age of Onset  . Emphysema Mother   . Cardiomyopathy Father   . Healthy Sister   . Heart attack Maternal Grandfather   . Healthy Sister   . Heart disease Paternal Uncle    Social History   Social History  . Marital Status: Married    Spouse Name: N/A  . Number of Children: N/A  . Years of Education: N/A   Occupational History  . Not on file.   Social History Main Topics  . Smoking status: Former Smoker -- 0.80 packs/day for 46 years    Types: Cigarettes    Quit date: 05/29/2014  . Smokeless tobacco: Never Used  . Alcohol Use: 12.6 - 16.8 oz/week    7 Glasses of wine, 7 Cans of beer, 7-14 Standard drinks or equivalent per week  . Drug Use: No  . Sexual Activity: Yes   Other Topics Concern  . Not on file   Social History Narrative   Wife recently diagnosed with early stage breast cancer   Wt  Readings from Last 3 Encounters:  03/01/15 155 lb 11.2 oz (70.625 kg)  10/09/14 152 lb 4.8 oz (69.083 kg)  07/15/14 155 lb (70.308 kg)   PHYSICAL EXAM BP 120/84 mmHg  Pulse 67  Ht 6' (1.829 m)  Wt 155 lb 11.2 oz (70.625 kg)  BMI 21.11 kg/m2 General appearance: alert, cooperative, appears stated age, no distress and Relatively healthy appearing. Normal mood and affect. Neck: no adenopathy, no carotid bruit, no JVD and supple, symmetrical, trachea midline Lungs: CTA B, normal percussion bilaterally and Nonlabored, the abdomen. Mild interstitial sounds Heart: RRR, S1& S2 normal, no murmur, click, rub or gallop, normal apical impulse and Well-healed sternotomy scar Abdomen: soft, non-tender; bowel sounds normal; no masses,  no organomegaly Extremities: extremities normal, atraumatic, no cyanosis or edema Pulses: 2+ and symmetric Skin: Skin color, texture,  turgor normal. No rashes or lesions Neurologic: Alert and oriented X 3, normal strength and tone. Normal symmetric reflexes. Normal coordination and gait   Adult ECG Report - n/a  Recent Labs: Due to check in April with PCP. Lab Results  Component Value Date   CHOL 158 06/04/2014   HDL 54 06/04/2014   LDLCALC 88 06/04/2014   TRIG 78 06/04/2014   CHOLHDL 2.9 06/04/2014   Lab Results  Component Value Date   CREATININE 0.87 07/07/2014   Lab Results  Component Value Date   K 4.2 07/07/2014    ASSESSMENT / PLAN: Doing quite well status post CABG. improved EF follow-up echo.  Problem List Items Addressed This Visit    Hyperlipidemia with target LDL less than 70 (Chronic)    On high-dose statin. Labs from May the relatively well-controlled LDL of 88. If not checked by PCP, will recheck.      Former heavy tobacco smoker - quit when he had diagnoses of CAD (Chronic)    He quit smoking, has not looked back. He is very happy with this. He says his breathing is deathly improving and is daily cough is simply be, mild intermittent occurrence as opposed to frequent coughing in the mornings. No wheezing.  Congratulated him on his efforts. He did this without gaining any weight.      Coronary artery disease involving native coronary artery with angina pectoris ([place]) (Chronic)    Noted to have multivessel disease now status post CABG. He is on high-dose statin, as well as low-dose beta blocker and ARB. He is on aspirin.  No recurrent symptoms. Doing well. Continuing with a Maintenance Program of Cardiac Rehabilitation.      Cardiomyopathy, ischemic: EF ~35-45% by LV Gram --> 50 and 55% by echo (Chronic)    Essentially resolved by recent echo. No heart failure symptoms. His blood pressure won't tolerate much more than the low-dose carvedilol and ARB that he is on.      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 Now back to full exercise near his  baseline. He is on beta blocker. He does have when necessary nitroglycerin as well.        OK to continue with Cardiac Rehab Maintenance Program  Meds ordered this encounter  Medications  . PEROXYL 1.5 % SOLN    Sig: RINSE TWICE DAILY    Refill:  0    Followup: 5-6 months  Kaliopi Blyden, Leonie Green, M.D., M.S. Interventional Cardiologist   Pager # [phone] Phone # [phone] [address] [place], [place] [id number]

## 2015-03-01 NOTE — Patient Instructions (Signed)
NO CHANGE IN CURRENT MEDICATIONS OR TREATMENT   Your physician wants you to follow-up in 6 MONTH WITH DR HARDING-30 MIN. You will receive a reminder letter in the mail two months in advance. If you don't receive a letter, please call our office to schedule the follow-up appointment.  If you need a refill on your cardiac medications before your next appointment, please call your pharmacy.

## 2015-03-02 ENCOUNTER — Encounter (HOSPITAL_COMMUNITY): Payer: Self-pay

## 2015-03-03 ENCOUNTER — Encounter: Payer: Self-pay | Admitting: Cardiology

## 2015-03-03 ENCOUNTER — Encounter (HOSPITAL_COMMUNITY): Payer: Self-pay

## 2015-03-03 NOTE — Assessment & Plan Note (Signed)
Essentially resolved by recent echo. No heart failure symptoms. His blood pressure won't tolerate much more than the low-dose carvedilol and ARB that he is on.

## 2015-03-03 NOTE — Assessment & Plan Note (Signed)
Resolved following CABG. Now back to full exercise near his baseline. He is on beta blocker. He does have when necessary nitroglycerin as well.

## 2015-03-03 NOTE — Assessment & Plan Note (Signed)
On high-dose statin. Labs from May the relatively well-controlled LDL of 88. If not checked by PCP, will recheck.

## 2015-03-03 NOTE — Assessment & Plan Note (Addendum)
He quit smoking, has not looked back. He is very happy with this. He says his breathing is deathly improving and is daily cough is simply be, mild intermittent occurrence as opposed to frequent coughing in the mornings. No wheezing.  Congratulated him on his efforts. He did this without gaining any weight.

## 2015-03-03 NOTE — Assessment & Plan Note (Addendum)
Noted to have multivessel disease now status post CABG. He is on high-dose statin, as well as low-dose beta blocker and ARB. He is on aspirin.  No recurrent symptoms. Doing well. Continuing with a Maintenance Program of Cardiac Rehabilitation.

## 2015-03-04 ENCOUNTER — Encounter (HOSPITAL_COMMUNITY)
Admission: RE | Admit: 2015-03-04 | Discharge: 2015-03-04 | Disposition: A | Discharge status: Home / Self Care | Payer: Self-pay | Source: Ambulatory Visit | Attending: Cardiology | Admitting: Cardiology

## 2015-03-04 DIAGNOSIS — Z951 Presence of aortocoronary bypass graft: Secondary | ICD-10-CM | POA: Insufficient documentation

## 2015-03-04 DIAGNOSIS — Z720 Tobacco use: Secondary | ICD-10-CM | POA: Insufficient documentation

## 2015-03-05 ENCOUNTER — Encounter (HOSPITAL_COMMUNITY): Payer: Self-pay

## 2015-03-05 ENCOUNTER — Encounter (HOSPITAL_COMMUNITY)
Admission: RE | Admit: 2015-03-05 | Discharge: 2015-03-05 | Disposition: A | Discharge status: Home / Self Care | Payer: Self-pay | Source: Ambulatory Visit | Attending: Cardiology | Admitting: Cardiology

## 2015-03-08 ENCOUNTER — Encounter (HOSPITAL_COMMUNITY)
Admission: RE | Admit: 2015-03-08 | Discharge: 2015-03-08 | Disposition: A | Discharge status: Home / Self Care | Payer: Self-pay | Source: Ambulatory Visit | Attending: Cardiology | Admitting: Cardiology

## 2015-03-08 ENCOUNTER — Encounter (HOSPITAL_COMMUNITY): Payer: Self-pay

## 2015-03-09 ENCOUNTER — Encounter (HOSPITAL_COMMUNITY): Payer: Self-pay

## 2015-03-10 ENCOUNTER — Encounter (HOSPITAL_COMMUNITY): Payer: Self-pay

## 2015-03-11 ENCOUNTER — Encounter (HOSPITAL_COMMUNITY): Payer: Self-pay

## 2015-03-12 ENCOUNTER — Encounter (HOSPITAL_COMMUNITY): Payer: Self-pay

## 2015-03-12 ENCOUNTER — Encounter (HOSPITAL_COMMUNITY)
Admission: RE | Admit: 2015-03-12 | Discharge: 2015-03-12 | Disposition: A | Discharge status: Home / Self Care | Payer: Self-pay | Source: Ambulatory Visit | Attending: Cardiology | Admitting: Cardiology

## 2015-03-15 ENCOUNTER — Encounter (HOSPITAL_COMMUNITY): Payer: Self-pay

## 2015-03-15 ENCOUNTER — Encounter (HOSPITAL_COMMUNITY)
Admission: RE | Admit: 2015-03-15 | Discharge: 2015-03-15 | Disposition: A | Discharge status: Home / Self Care | Payer: Self-pay | Source: Ambulatory Visit | Attending: Cardiology | Admitting: Cardiology

## 2015-03-16 ENCOUNTER — Encounter (HOSPITAL_COMMUNITY)
Admission: RE | Admit: 2015-03-16 | Discharge: 2015-03-16 | Disposition: A | Discharge status: Home / Self Care | Payer: Self-pay | Source: Ambulatory Visit | Attending: Cardiology | Admitting: Cardiology

## 2015-03-17 ENCOUNTER — Encounter (HOSPITAL_COMMUNITY): Payer: Self-pay

## 2015-03-17 ENCOUNTER — Encounter (HOSPITAL_COMMUNITY)
Admission: RE | Admit: 2015-03-17 | Discharge: 2015-03-17 | Disposition: A | Discharge status: Home / Self Care | Payer: Self-pay | Source: Ambulatory Visit | Attending: Cardiology | Admitting: Cardiology

## 2015-03-18 ENCOUNTER — Encounter (HOSPITAL_COMMUNITY): Payer: Self-pay

## 2015-03-19 ENCOUNTER — Encounter (HOSPITAL_COMMUNITY): Payer: Self-pay

## 2015-03-22 ENCOUNTER — Encounter (HOSPITAL_COMMUNITY): Payer: Self-pay

## 2015-03-23 ENCOUNTER — Encounter (HOSPITAL_COMMUNITY)
Admission: RE | Admit: 2015-03-23 | Discharge: 2015-03-23 | Disposition: A | Discharge status: Home / Self Care | Payer: Self-pay | Source: Ambulatory Visit | Attending: Cardiology | Admitting: Cardiology

## 2015-03-24 ENCOUNTER — Encounter (HOSPITAL_COMMUNITY): Payer: Self-pay

## 2015-03-24 ENCOUNTER — Encounter (HOSPITAL_COMMUNITY)
Admission: RE | Admit: 2015-03-24 | Discharge: 2015-03-24 | Disposition: A | Discharge status: Home / Self Care | Payer: Self-pay | Source: Ambulatory Visit | Attending: Cardiology | Admitting: Cardiology

## 2015-03-25 ENCOUNTER — Encounter (HOSPITAL_COMMUNITY)
Admission: RE | Admit: 2015-03-25 | Discharge: 2015-03-25 | Disposition: A | Discharge status: Home / Self Care | Payer: Self-pay | Source: Ambulatory Visit | Attending: Cardiology | Admitting: Cardiology

## 2015-03-26 ENCOUNTER — Encounter (HOSPITAL_COMMUNITY): Payer: Self-pay

## 2015-03-29 ENCOUNTER — Encounter (HOSPITAL_COMMUNITY): Payer: Self-pay

## 2015-03-29 ENCOUNTER — Encounter (HOSPITAL_COMMUNITY)
Admission: RE | Admit: 2015-03-29 | Discharge: 2015-03-29 | Disposition: A | Discharge status: Home / Self Care | Payer: Self-pay | Source: Ambulatory Visit | Attending: Cardiology | Admitting: Cardiology

## 2015-03-30 ENCOUNTER — Encounter (HOSPITAL_COMMUNITY)
Admission: RE | Admit: 2015-03-30 | Discharge: 2015-03-30 | Disposition: A | Discharge status: Home / Self Care | Payer: Self-pay | Source: Ambulatory Visit | Attending: Cardiology | Admitting: Cardiology

## 2015-03-31 ENCOUNTER — Encounter (HOSPITAL_COMMUNITY): Payer: Self-pay

## 2015-04-01 ENCOUNTER — Encounter (HOSPITAL_COMMUNITY): Payer: Self-pay

## 2015-04-01 DIAGNOSIS — Z720 Tobacco use: Secondary | ICD-10-CM | POA: Insufficient documentation

## 2015-04-01 DIAGNOSIS — Z951 Presence of aortocoronary bypass graft: Secondary | ICD-10-CM | POA: Insufficient documentation

## 2015-04-02 ENCOUNTER — Encounter (HOSPITAL_COMMUNITY): Payer: Self-pay

## 2015-04-02 ENCOUNTER — Encounter (HOSPITAL_COMMUNITY)
Admission: RE | Admit: 2015-04-02 | Discharge: 2015-04-02 | Disposition: A | Discharge status: Home / Self Care | Payer: Self-pay | Source: Ambulatory Visit | Attending: Cardiology | Admitting: Cardiology

## 2015-04-05 ENCOUNTER — Encounter (HOSPITAL_COMMUNITY): Payer: Self-pay

## 2015-04-05 ENCOUNTER — Encounter (HOSPITAL_COMMUNITY)
Admission: RE | Admit: 2015-04-05 | Discharge: 2015-04-05 | Disposition: A | Discharge status: Home / Self Care | Payer: Self-pay | Source: Ambulatory Visit | Attending: Cardiology | Admitting: Cardiology

## 2015-04-06 ENCOUNTER — Encounter (HOSPITAL_COMMUNITY): Payer: Self-pay

## 2015-04-07 ENCOUNTER — Encounter (HOSPITAL_COMMUNITY): Payer: Self-pay

## 2015-04-08 ENCOUNTER — Encounter (HOSPITAL_COMMUNITY): Payer: Self-pay

## 2015-04-09 ENCOUNTER — Encounter (HOSPITAL_COMMUNITY): Payer: Self-pay

## 2015-04-09 ENCOUNTER — Encounter (HOSPITAL_COMMUNITY)
Admission: RE | Admit: 2015-04-09 | Discharge: 2015-04-09 | Disposition: A | Discharge status: Home / Self Care | Payer: Self-pay | Source: Ambulatory Visit | Attending: Cardiology | Admitting: Cardiology

## 2015-04-12 ENCOUNTER — Encounter (HOSPITAL_COMMUNITY): Payer: Self-pay

## 2015-04-12 ENCOUNTER — Encounter (HOSPITAL_COMMUNITY)
Admission: RE | Admit: 2015-04-12 | Discharge: 2015-04-12 | Disposition: A | Discharge status: Home / Self Care | Payer: Self-pay | Source: Ambulatory Visit | Attending: Cardiology | Admitting: Cardiology

## 2015-04-13 ENCOUNTER — Encounter (HOSPITAL_COMMUNITY): Payer: Self-pay

## 2015-04-14 ENCOUNTER — Encounter (HOSPITAL_COMMUNITY)
Admission: RE | Admit: 2015-04-14 | Discharge: 2015-04-14 | Disposition: A | Discharge status: Home / Self Care | Payer: Self-pay | Source: Ambulatory Visit | Attending: Cardiology | Admitting: Cardiology

## 2015-04-14 ENCOUNTER — Encounter (HOSPITAL_COMMUNITY): Payer: Self-pay

## 2015-04-15 ENCOUNTER — Encounter (HOSPITAL_COMMUNITY): Payer: Self-pay

## 2015-04-16 ENCOUNTER — Encounter (HOSPITAL_COMMUNITY): Payer: Self-pay

## 2015-04-19 ENCOUNTER — Encounter (HOSPITAL_COMMUNITY)
Admission: RE | Admit: 2015-04-19 | Discharge: 2015-04-19 | Disposition: A | Discharge status: Home / Self Care | Payer: Self-pay | Source: Ambulatory Visit | Attending: Cardiology | Admitting: Cardiology

## 2015-04-19 ENCOUNTER — Encounter (HOSPITAL_COMMUNITY): Payer: Self-pay

## 2015-04-20 ENCOUNTER — Encounter (HOSPITAL_COMMUNITY): Payer: Self-pay

## 2015-04-21 ENCOUNTER — Encounter (HOSPITAL_COMMUNITY)
Admission: RE | Admit: 2015-04-21 | Discharge: 2015-04-21 | Disposition: A | Discharge status: Home / Self Care | Payer: Self-pay | Source: Ambulatory Visit | Attending: Cardiology | Admitting: Cardiology

## 2015-04-21 ENCOUNTER — Encounter (HOSPITAL_COMMUNITY): Payer: Self-pay

## 2015-04-22 ENCOUNTER — Encounter (HOSPITAL_COMMUNITY): Payer: Self-pay

## 2015-04-23 ENCOUNTER — Encounter (HOSPITAL_COMMUNITY)
Admission: RE | Admit: 2015-04-23 | Discharge: 2015-04-23 | Disposition: A | Discharge status: Home / Self Care | Payer: Self-pay | Source: Ambulatory Visit | Attending: Cardiology | Admitting: Cardiology

## 2015-04-23 ENCOUNTER — Encounter (HOSPITAL_COMMUNITY): Payer: Self-pay

## 2015-04-26 ENCOUNTER — Encounter (HOSPITAL_COMMUNITY)
Admission: RE | Admit: 2015-04-26 | Discharge: 2015-04-26 | Disposition: A | Discharge status: Home / Self Care | Payer: Self-pay | Source: Ambulatory Visit | Attending: Cardiology | Admitting: Cardiology

## 2015-04-26 ENCOUNTER — Encounter (HOSPITAL_COMMUNITY): Payer: Self-pay

## 2015-04-27 ENCOUNTER — Encounter (HOSPITAL_COMMUNITY)
Admission: RE | Admit: 2015-04-27 | Discharge: 2015-04-27 | Disposition: A | Discharge status: Home / Self Care | Payer: Self-pay | Source: Ambulatory Visit | Attending: Cardiology | Admitting: Cardiology

## 2015-04-28 ENCOUNTER — Encounter (HOSPITAL_COMMUNITY): Payer: Self-pay

## 2015-04-29 ENCOUNTER — Encounter (HOSPITAL_COMMUNITY): Payer: Self-pay

## 2015-04-30 ENCOUNTER — Encounter (HOSPITAL_COMMUNITY)
Admission: RE | Admit: 2015-04-30 | Discharge: 2015-04-30 | Disposition: A | Discharge status: Home / Self Care | Payer: Self-pay | Source: Ambulatory Visit | Attending: Cardiology | Admitting: Cardiology

## 2015-04-30 ENCOUNTER — Encounter (HOSPITAL_COMMUNITY): Payer: Self-pay

## 2015-05-03 ENCOUNTER — Encounter (HOSPITAL_COMMUNITY)
Admission: RE | Admit: 2015-05-03 | Discharge: 2015-05-03 | Disposition: A | Discharge status: Home / Self Care | Payer: Self-pay | Source: Ambulatory Visit | Attending: Cardiology | Admitting: Cardiology

## 2015-05-03 DIAGNOSIS — Z951 Presence of aortocoronary bypass graft: Secondary | ICD-10-CM | POA: Insufficient documentation

## 2015-05-04 ENCOUNTER — Encounter (HOSPITAL_COMMUNITY)
Admission: RE | Admit: 2015-05-04 | Discharge: 2015-05-04 | Disposition: A | Discharge status: Home / Self Care | Payer: Self-pay | Source: Ambulatory Visit | Attending: Cardiology | Admitting: Cardiology

## 2015-05-06 ENCOUNTER — Encounter (HOSPITAL_COMMUNITY)
Admission: RE | Admit: 2015-05-06 | Discharge: 2015-05-06 | Disposition: A | Discharge status: Home / Self Care | Payer: Self-pay | Source: Ambulatory Visit | Attending: Cardiology | Admitting: Cardiology

## 2015-05-10 ENCOUNTER — Encounter (HOSPITAL_COMMUNITY): Payer: Self-pay

## 2015-05-11 ENCOUNTER — Encounter (HOSPITAL_COMMUNITY)
Admission: RE | Admit: 2015-05-11 | Discharge: 2015-05-11 | Disposition: A | Discharge status: Home / Self Care | Payer: Self-pay | Source: Ambulatory Visit | Attending: Cardiology | Admitting: Cardiology

## 2015-05-13 ENCOUNTER — Encounter (HOSPITAL_COMMUNITY)
Admission: RE | Admit: 2015-05-13 | Discharge: 2015-05-13 | Disposition: A | Discharge status: Home / Self Care | Payer: Self-pay | Source: Ambulatory Visit | Attending: Cardiology | Admitting: Cardiology

## 2015-05-14 ENCOUNTER — Encounter (HOSPITAL_COMMUNITY)
Admission: RE | Admit: 2015-05-14 | Discharge: 2015-05-14 | Disposition: A | Discharge status: Home / Self Care | Payer: Self-pay | Source: Ambulatory Visit | Attending: Cardiology | Admitting: Cardiology

## 2015-05-17 ENCOUNTER — Encounter (HOSPITAL_COMMUNITY): Payer: Self-pay

## 2015-05-18 ENCOUNTER — Encounter (HOSPITAL_COMMUNITY): Payer: Self-pay

## 2015-05-20 ENCOUNTER — Encounter (HOSPITAL_COMMUNITY): Payer: Self-pay

## 2015-05-24 ENCOUNTER — Encounter (HOSPITAL_COMMUNITY): Payer: Self-pay

## 2015-05-25 ENCOUNTER — Encounter (HOSPITAL_COMMUNITY): Payer: Self-pay

## 2015-05-27 ENCOUNTER — Other Ambulatory Visit: Payer: Self-pay | Admitting: Internal Medicine

## 2015-05-27 ENCOUNTER — Encounter (HOSPITAL_COMMUNITY): Payer: Self-pay

## 2015-05-27 DIAGNOSIS — Z87891 Personal history of nicotine dependence: Secondary | ICD-10-CM

## 2015-05-31 ENCOUNTER — Encounter (HOSPITAL_COMMUNITY)
Admission: RE | Admit: 2015-05-31 | Discharge: 2015-05-31 | Disposition: A | Discharge status: Home / Self Care | Payer: Self-pay | Source: Ambulatory Visit | Attending: Cardiology | Admitting: Cardiology

## 2015-05-31 DIAGNOSIS — Z951 Presence of aortocoronary bypass graft: Secondary | ICD-10-CM | POA: Insufficient documentation

## 2015-06-01 ENCOUNTER — Encounter (HOSPITAL_COMMUNITY): Payer: Self-pay

## 2015-06-02 ENCOUNTER — Encounter (HOSPITAL_COMMUNITY)
Admission: RE | Admit: 2015-06-02 | Discharge: 2015-06-02 | Disposition: A | Discharge status: Home / Self Care | Payer: Self-pay | Source: Ambulatory Visit | Attending: Cardiology | Admitting: Cardiology

## 2015-06-03 ENCOUNTER — Encounter (HOSPITAL_COMMUNITY): Payer: Self-pay

## 2015-06-04 ENCOUNTER — Encounter (HOSPITAL_COMMUNITY)
Admission: RE | Admit: 2015-06-04 | Discharge: 2015-06-04 | Disposition: A | Discharge status: Home / Self Care | Payer: Self-pay | Source: Ambulatory Visit | Attending: Cardiology | Admitting: Cardiology

## 2015-06-07 ENCOUNTER — Encounter (HOSPITAL_COMMUNITY)
Admission: RE | Admit: 2015-06-07 | Discharge: 2015-06-07 | Disposition: A | Discharge status: Home / Self Care | Payer: Self-pay | Source: Ambulatory Visit | Attending: Cardiology | Admitting: Cardiology

## 2015-06-08 ENCOUNTER — Encounter (HOSPITAL_COMMUNITY): Payer: Self-pay

## 2015-06-08 ENCOUNTER — Ambulatory Visit
Admission: RE | Admit: 2015-06-08 | Discharge: 2015-06-08 | Disposition: A | Discharge status: Home / Self Care | Payer: BLUE CROSS/BLUE SHIELD | Source: Ambulatory Visit | Attending: Internal Medicine | Admitting: Internal Medicine

## 2015-06-08 DIAGNOSIS — Z87891 Personal history of nicotine dependence: Secondary | ICD-10-CM

## 2015-06-09 ENCOUNTER — Encounter (HOSPITAL_COMMUNITY)
Admission: RE | Admit: 2015-06-09 | Discharge: 2015-06-09 | Disposition: A | Discharge status: Home / Self Care | Payer: Self-pay | Source: Ambulatory Visit | Attending: Cardiology | Admitting: Cardiology

## 2015-06-10 ENCOUNTER — Encounter (HOSPITAL_COMMUNITY): Payer: Self-pay

## 2015-06-11 ENCOUNTER — Encounter (HOSPITAL_COMMUNITY)
Admission: RE | Admit: 2015-06-11 | Discharge: 2015-06-11 | Disposition: A | Discharge status: Home / Self Care | Payer: Self-pay | Source: Ambulatory Visit | Attending: Cardiology | Admitting: Cardiology

## 2015-06-14 ENCOUNTER — Encounter (HOSPITAL_COMMUNITY)
Admission: RE | Admit: 2015-06-14 | Discharge: 2015-06-14 | Disposition: A | Discharge status: Home / Self Care | Payer: Self-pay | Source: Ambulatory Visit | Attending: Cardiology | Admitting: Cardiology

## 2015-06-15 ENCOUNTER — Encounter (HOSPITAL_COMMUNITY): Payer: Self-pay

## 2015-06-17 ENCOUNTER — Encounter (HOSPITAL_COMMUNITY): Payer: Self-pay

## 2015-06-18 ENCOUNTER — Encounter (HOSPITAL_COMMUNITY)
Admission: RE | Admit: 2015-06-18 | Discharge: 2015-06-18 | Disposition: A | Discharge status: Home / Self Care | Payer: Self-pay | Source: Ambulatory Visit | Attending: Cardiology | Admitting: Cardiology

## 2015-06-21 ENCOUNTER — Encounter (HOSPITAL_COMMUNITY)
Admission: RE | Admit: 2015-06-21 | Discharge: 2015-06-21 | Disposition: A | Discharge status: Home / Self Care | Payer: Self-pay | Source: Ambulatory Visit | Attending: Cardiology | Admitting: Cardiology

## 2015-06-22 ENCOUNTER — Encounter (HOSPITAL_COMMUNITY): Payer: Self-pay

## 2015-06-24 ENCOUNTER — Encounter (HOSPITAL_COMMUNITY): Payer: Self-pay

## 2015-06-25 ENCOUNTER — Encounter (HOSPITAL_COMMUNITY)
Admission: RE | Admit: 2015-06-25 | Discharge: 2015-06-25 | Disposition: A | Discharge status: Home / Self Care | Payer: Self-pay | Source: Ambulatory Visit | Attending: Cardiology | Admitting: Cardiology

## 2015-06-29 ENCOUNTER — Encounter (HOSPITAL_COMMUNITY)
Admission: RE | Admit: 2015-06-29 | Discharge: 2015-06-29 | Disposition: A | Discharge status: Home / Self Care | Payer: Self-pay | Source: Ambulatory Visit | Attending: Cardiology | Admitting: Cardiology

## 2015-06-30 ENCOUNTER — Other Ambulatory Visit: Payer: Self-pay | Admitting: Cardiology

## 2015-06-30 NOTE — Telephone Encounter (Signed)
Rx(s) sent to pharmacy electronically.  

## 2015-07-01 ENCOUNTER — Encounter (HOSPITAL_COMMUNITY): Payer: Self-pay

## 2015-07-01 DIAGNOSIS — Z951 Presence of aortocoronary bypass graft: Secondary | ICD-10-CM | POA: Insufficient documentation

## 2015-07-05 ENCOUNTER — Encounter (HOSPITAL_COMMUNITY)
Admission: RE | Admit: 2015-07-05 | Discharge: 2015-07-05 | Disposition: A | Discharge status: Home / Self Care | Payer: Self-pay | Source: Ambulatory Visit | Attending: Cardiology | Admitting: Cardiology

## 2015-07-06 ENCOUNTER — Encounter (HOSPITAL_COMMUNITY): Payer: Self-pay

## 2015-07-08 ENCOUNTER — Encounter (HOSPITAL_COMMUNITY): Payer: Self-pay

## 2015-07-12 ENCOUNTER — Encounter (HOSPITAL_COMMUNITY)
Admission: RE | Admit: 2015-07-12 | Discharge: 2015-07-12 | Disposition: A | Discharge status: Home / Self Care | Payer: Self-pay | Source: Ambulatory Visit | Attending: Cardiology | Admitting: Cardiology

## 2015-07-13 ENCOUNTER — Encounter (HOSPITAL_COMMUNITY): Payer: Self-pay

## 2015-07-14 ENCOUNTER — Encounter (HOSPITAL_COMMUNITY)
Admission: RE | Admit: 2015-07-14 | Discharge: 2015-07-14 | Disposition: A | Discharge status: Home / Self Care | Payer: Self-pay | Source: Ambulatory Visit | Attending: Cardiology | Admitting: Cardiology

## 2015-07-15 ENCOUNTER — Other Ambulatory Visit: Payer: Self-pay | Admitting: Cardiology

## 2015-07-15 ENCOUNTER — Encounter (HOSPITAL_COMMUNITY): Payer: Self-pay

## 2015-07-15 NOTE — Telephone Encounter (Signed)
Rx(s) sent to pharmacy electronically.  

## 2015-07-16 ENCOUNTER — Encounter (HOSPITAL_COMMUNITY)
Admission: RE | Admit: 2015-07-16 | Discharge: 2015-07-16 | Disposition: A | Discharge status: Home / Self Care | Payer: Self-pay | Source: Ambulatory Visit | Attending: Cardiology | Admitting: Cardiology

## 2015-07-19 ENCOUNTER — Encounter (HOSPITAL_COMMUNITY)
Admission: RE | Admit: 2015-07-19 | Discharge: 2015-07-19 | Disposition: A | Discharge status: Home / Self Care | Payer: Self-pay | Source: Ambulatory Visit | Attending: Cardiology | Admitting: Cardiology

## 2015-07-20 ENCOUNTER — Encounter (HOSPITAL_COMMUNITY): Payer: Self-pay

## 2015-07-22 ENCOUNTER — Encounter (HOSPITAL_COMMUNITY): Payer: Self-pay

## 2015-07-26 ENCOUNTER — Encounter (HOSPITAL_COMMUNITY)
Admission: RE | Admit: 2015-07-26 | Discharge: 2015-07-26 | Disposition: A | Discharge status: Home / Self Care | Payer: Self-pay | Source: Ambulatory Visit | Attending: Cardiology | Admitting: Cardiology

## 2015-07-27 ENCOUNTER — Encounter (HOSPITAL_COMMUNITY): Payer: Self-pay

## 2015-07-29 ENCOUNTER — Encounter (HOSPITAL_COMMUNITY): Payer: Self-pay

## 2015-08-02 ENCOUNTER — Encounter (HOSPITAL_COMMUNITY)
Admission: RE | Admit: 2015-08-02 | Discharge: 2015-08-02 | Disposition: A | Discharge status: Home / Self Care | Payer: Self-pay | Source: Ambulatory Visit | Attending: Cardiology | Admitting: Cardiology

## 2015-08-02 DIAGNOSIS — Z951 Presence of aortocoronary bypass graft: Secondary | ICD-10-CM | POA: Insufficient documentation

## 2015-08-04 ENCOUNTER — Encounter (HOSPITAL_COMMUNITY)
Admission: RE | Admit: 2015-08-04 | Discharge: 2015-08-04 | Disposition: A | Discharge status: Home / Self Care | Payer: Self-pay | Source: Ambulatory Visit | Attending: Cardiology | Admitting: Cardiology

## 2015-08-05 ENCOUNTER — Encounter (HOSPITAL_COMMUNITY): Payer: Self-pay

## 2015-08-06 ENCOUNTER — Encounter (HOSPITAL_COMMUNITY)
Admission: RE | Admit: 2015-08-06 | Discharge: 2015-08-06 | Disposition: A | Discharge status: Home / Self Care | Payer: Self-pay | Source: Ambulatory Visit | Attending: Cardiology | Admitting: Cardiology

## 2015-08-09 ENCOUNTER — Encounter (HOSPITAL_COMMUNITY)
Admission: RE | Admit: 2015-08-09 | Discharge: 2015-08-09 | Disposition: A | Discharge status: Home / Self Care | Payer: Self-pay | Source: Ambulatory Visit | Attending: Cardiology | Admitting: Cardiology

## 2015-08-10 ENCOUNTER — Encounter (HOSPITAL_COMMUNITY): Payer: Self-pay

## 2015-08-11 ENCOUNTER — Encounter (HOSPITAL_COMMUNITY)
Admission: RE | Admit: 2015-08-11 | Discharge: 2015-08-11 | Disposition: A | Discharge status: Home / Self Care | Payer: Self-pay | Source: Ambulatory Visit | Attending: Cardiology | Admitting: Cardiology

## 2015-08-12 ENCOUNTER — Encounter (HOSPITAL_COMMUNITY): Payer: Self-pay

## 2015-08-13 ENCOUNTER — Encounter (HOSPITAL_COMMUNITY)
Admission: RE | Admit: 2015-08-13 | Discharge: 2015-08-13 | Disposition: A | Discharge status: Home / Self Care | Payer: Self-pay | Source: Ambulatory Visit | Attending: Cardiology | Admitting: Cardiology

## 2015-08-16 ENCOUNTER — Encounter (HOSPITAL_COMMUNITY)
Admission: RE | Admit: 2015-08-16 | Discharge: 2015-08-16 | Disposition: A | Discharge status: Home / Self Care | Payer: Self-pay | Source: Ambulatory Visit | Attending: Cardiology | Admitting: Cardiology

## 2015-08-17 ENCOUNTER — Encounter (HOSPITAL_COMMUNITY): Payer: Self-pay

## 2015-08-18 ENCOUNTER — Encounter (HOSPITAL_COMMUNITY)
Admission: RE | Admit: 2015-08-18 | Discharge: 2015-08-18 | Disposition: A | Discharge status: Home / Self Care | Payer: Self-pay | Source: Ambulatory Visit | Attending: Cardiology | Admitting: Cardiology

## 2015-08-19 ENCOUNTER — Encounter (HOSPITAL_COMMUNITY): Payer: Self-pay

## 2015-08-23 ENCOUNTER — Encounter (HOSPITAL_COMMUNITY): Payer: Self-pay

## 2015-08-24 ENCOUNTER — Encounter (HOSPITAL_COMMUNITY): Payer: Self-pay

## 2015-08-26 ENCOUNTER — Encounter (HOSPITAL_COMMUNITY): Payer: Self-pay

## 2015-08-30 ENCOUNTER — Encounter (HOSPITAL_COMMUNITY): Payer: Self-pay

## 2015-08-31 ENCOUNTER — Encounter (HOSPITAL_COMMUNITY): Payer: Self-pay

## 2015-08-31 DIAGNOSIS — Z951 Presence of aortocoronary bypass graft: Secondary | ICD-10-CM | POA: Insufficient documentation

## 2015-09-02 ENCOUNTER — Ambulatory Visit: Payer: BLUE CROSS/BLUE SHIELD | Admitting: Cardiology

## 2015-09-02 ENCOUNTER — Encounter (HOSPITAL_COMMUNITY): Payer: Self-pay

## 2015-09-06 ENCOUNTER — Encounter (HOSPITAL_COMMUNITY): Payer: Self-pay

## 2015-09-07 ENCOUNTER — Encounter (HOSPITAL_COMMUNITY): Payer: Self-pay

## 2015-09-09 ENCOUNTER — Encounter (HOSPITAL_COMMUNITY): Payer: Self-pay

## 2015-09-13 ENCOUNTER — Encounter (HOSPITAL_COMMUNITY)
Admission: RE | Admit: 2015-09-13 | Discharge: 2015-09-13 | Disposition: A | Discharge status: Home / Self Care | Payer: Self-pay | Source: Ambulatory Visit | Attending: Cardiology | Admitting: Cardiology

## 2015-09-14 ENCOUNTER — Encounter (HOSPITAL_COMMUNITY): Payer: Self-pay

## 2015-09-15 ENCOUNTER — Encounter (HOSPITAL_COMMUNITY)
Admission: RE | Admit: 2015-09-15 | Discharge: 2015-09-15 | Disposition: A | Discharge status: Home / Self Care | Payer: Self-pay | Source: Ambulatory Visit | Attending: Cardiology | Admitting: Cardiology

## 2015-09-16 ENCOUNTER — Encounter (HOSPITAL_COMMUNITY): Payer: Self-pay

## 2015-09-17 ENCOUNTER — Encounter (HOSPITAL_COMMUNITY)
Admission: RE | Admit: 2015-09-17 | Discharge: 2015-09-17 | Disposition: A | Discharge status: Home / Self Care | Payer: Self-pay | Source: Ambulatory Visit | Attending: Cardiology | Admitting: Cardiology

## 2015-09-20 ENCOUNTER — Encounter (HOSPITAL_COMMUNITY)
Admission: RE | Admit: 2015-09-20 | Discharge: 2015-09-20 | Disposition: A | Discharge status: Home / Self Care | Payer: Self-pay | Source: Ambulatory Visit | Attending: Cardiology | Admitting: Cardiology

## 2015-09-21 ENCOUNTER — Encounter (HOSPITAL_COMMUNITY): Payer: Self-pay

## 2015-09-22 ENCOUNTER — Encounter (HOSPITAL_COMMUNITY)
Admission: RE | Admit: 2015-09-22 | Discharge: 2015-09-22 | Disposition: A | Discharge status: Home / Self Care | Payer: Self-pay | Source: Ambulatory Visit | Attending: Cardiology | Admitting: Cardiology

## 2015-09-23 ENCOUNTER — Encounter (HOSPITAL_COMMUNITY): Payer: Self-pay

## 2015-09-24 ENCOUNTER — Encounter (HOSPITAL_COMMUNITY)
Admission: RE | Admit: 2015-09-24 | Discharge: 2015-09-24 | Disposition: A | Discharge status: Home / Self Care | Payer: Self-pay | Source: Ambulatory Visit | Attending: Cardiology | Admitting: Cardiology

## 2015-09-27 ENCOUNTER — Encounter (HOSPITAL_COMMUNITY)
Admission: RE | Admit: 2015-09-27 | Discharge: 2015-09-27 | Disposition: A | Discharge status: Home / Self Care | Payer: Self-pay | Source: Ambulatory Visit | Attending: Cardiology | Admitting: Cardiology

## 2015-09-28 ENCOUNTER — Other Ambulatory Visit: Payer: Self-pay | Admitting: Cardiology

## 2015-09-28 ENCOUNTER — Encounter (HOSPITAL_COMMUNITY): Payer: Self-pay

## 2015-09-28 ENCOUNTER — Other Ambulatory Visit: Payer: Self-pay

## 2015-09-28 MED ORDER — CARVEDILOL 3.125 MG PO TABS: 6.2500 mg | ORAL_TABLET | Freq: Two times a day (BID) | ORAL | 1 refills | Status: DC

## 2015-09-29 ENCOUNTER — Encounter (HOSPITAL_COMMUNITY)
Admission: RE | Admit: 2015-09-29 | Discharge: 2015-09-29 | Disposition: A | Discharge status: Home / Self Care | Payer: Self-pay | Source: Ambulatory Visit | Attending: Cardiology | Admitting: Cardiology

## 2015-09-30 ENCOUNTER — Encounter (HOSPITAL_COMMUNITY): Payer: Self-pay

## 2015-09-30 ENCOUNTER — Encounter: Payer: Self-pay | Admitting: Cardiology

## 2015-09-30 NOTE — Progress Notes (Addendum)
PATIENT: Kelly Olson. MRN: [id number] DOB: 04/26/1950 PCP: Irven Shelling, MD  Clinic Note: Chief Complaint  Patient presents with  . Follow-up    6 month visit,no chest pain,no swelling, no s.o.b  . Coronary Artery Disease    Status post CABG    HPI: Kelly Olson. is a 65 y.o. male with a PMH below who presents today for f/u s/p recent CABG x 4 06/04/2014. Uncomplicated post-op course.  Quit smoking the day of his Stress Test after brief discussion with me. He initially had reduced ejection fraction, however follow-up echocardiogram is showing improved EF. He was seen in clinic by Mr. Tarri Fuller on 06/01/2014 for exertional Chest pain -- underwent Myoview stress test that was read as abnormal/high risk.  This resulted in cardiac catheterization. Prior to this - was walking~7.5 miles/day - but noted mild chest discomfort with HR 118-120 bpm  I last saw him in January 2017. No new hospitalizations or studies to review  Continues Maintenance CRH & walking other days. -- His wife was recently diagnosed with breast cancer and will likely have some lumpectomy as well as XRT plus minus chemotherapy. She was planning on coming to see me in February, however may postpone until she has resolved her breast cancer issues.  Interval History: Kelly Olson presents today Feeling great. He states that he didn't know how bad he was before his bypass surgery until he felt well afterwards. He is still doing cardiac rehabilitation made in his program, and loving it. He is exercising on his off days as well. He says that his shortness of breath is improved as he is gone following from smoking and his bypass surgery.  He says that he is up to > 6 METS @ [place]. He does note that he is not really able to get his heart rate up to watch, but really does not notice any fatigue or dyspnea. Continues to travel now with work - but notably less.  Still vacationing. No heart failure symptoms. No edema. No  palpitations rapid heartbeat. No angina.  The remainder of cardiac review of systems is as follows: Cardiovascular ROS: positive for - hands/feet tingling - like the "are asleep" negative for - chest pain, dyspnea on exertion, edema, irregular heartbeat, loss of consciousness, murmur, orthopnea, palpitations, paroxysmal nocturnal dyspnea, rapid heart rate, shortness of breath or syncope/near syncope, TIA/amaurosis fugax  After effects of smoking getting better - less coughing, no wheeze.  Just sinus drainage.  ROS: A comprehensive Review of Systems - was performed Review of Systems  Constitutional: Negative for malaise/fatigue.  HENT: Positive for congestion (allergies). Negative for nosebleeds.   Respiratory: Positive for cough (Notably improving - now more related to sinus congestion). Negative for shortness of breath and wheezing.   Cardiovascular: Negative.  Negative for claudication.  Gastrointestinal: Negative for blood in stool, heartburn (occasionally - food related) and melena.  Genitourinary: Negative for hematuria.  Musculoskeletal: Positive for joint pain (knees ache). Negative for falls and myalgias.  Neurological: Negative.  Negative for dizziness.  Endo/Heme/Allergies: Positive for environmental allergies. Does not bruise/bleed easily.  Psychiatric/Behavioral: Negative for depression. The patient has insomnia (ccasionally.). The patient is not nervous/anxious.   All other systems reviewed and are negative.   Past Medical History:  Diagnosis Date  . Coronary artery disease, occlusive 06/02/2014   Multivessel CAD.mLAD-100%, mRCA 99%, dRCA 100%, mLCX 90% And EF 35-45%.  . Former heavy tobacco smoker    Quit in April 2016   . Hyperlipidemia  with target LDL less than 70   . Ischemic cardiomyopathy - resolved 05/2014   Myoview: EF ~33% with "infarction vs. severe resting ischemia in LAD & RCA territory; b) EF by Cath: 35-45%. c) post CABG Echo 9016: EF 50-55%  . S/P CABG x 4  06/04/2014   LIMA-OM, RIMA-LAD, SVG-Diag, SVG-rPDA    Past Surgical History:  Procedure Laterality Date  . CARDIAC CATHETERIZATION N/A 06/02/2014   Procedure: Left Heart Cath and Coronary Angiography;  Surgeon: Leonie Man, MD;  Location: [facility] CUPID;  Service: Cardiovascular;  mLAD-100%, mRCA 99%, dRCA 100%, mLCX 90% And EF 35-45%.  . CORONARY ARTERY BYPASS GRAFT N/A 06/04/2014   Procedure: CORONARY ARTERY BYPASS GRAFTING (CABG) times four using bilateral internal mammary arteries and EVH for left  leg saphenous vein;  Surgeon: Gaye Pollack, MD;  Location: [facility];  Service: Open Heart Surgery;  LIMA-OM, RIMA-LAD, SVG-Diag, SVG-rPDA  . NM MYOVIEW LTD  05/28/2014   Pre-CABG:  High Risk Nuclear Stress Test with multivessel distribution ischemia. -- Severely ischemic Cardiomyoapthy with evidence of at least 2 vessel disease & EF of ~33%. Images are consistent with either infarction or severe resting ischemia in the LAD & RCA territory.  . TEE WITHOUT CARDIOVERSION N/A 06/04/2014   Procedure: TRANSESOPHAGEAL ECHOCARDIOGRAM (TEE);  Surgeon: Gaye Pollack, MD;  Location: [facility];  Service: Open Heart Surgery;  Laterality: N/A;  . TONSILLECTOMY  1957  . TRANSTHORACIC ECHOCARDIOGRAM  10/2014   Mild concentric LVH. EF 50-55% with mild HK of basal anteroseptal myocardium. GR 1 DD.   No Known Allergies  Prior to Admission medications   Medication Sig Start Date End Date Taking? Authorizing Provider  aspirin EC 325 MG tablet TAKE 1 TABLET BY MOUTH DAILY 07/07/14  Yes Leonie Man, MD  atorvastatin (LIPITOR) 80 MG tablet TAKE 1 TABLET DAILY AT 6 P.M. 07/15/15  Yes Leonie Man, MD  carvedilol (COREG) 3.125 MG tablet Take 2 tablets (6.25 mg total) by mouth 2 (two) times daily. 09/28/15  Yes Leonie Man, MD  losartan (COZAAR) 25 MG tablet Take 12.5 mg by mouth daily.   Yes Historical Provider, MD    Family History  Problem Relation Age of Onset  . Emphysema Mother   . Cardiomyopathy  Father   . Healthy Sister   . Heart attack Maternal Grandfather   . Healthy Sister   . Heart disease Paternal Uncle    Social History   Social History  . Marital status: Married    Spouse name: N/A  . Number of children: N/A  . Years of education: N/A   Occupational History  . Not on file.   Social History Main Topics  . Smoking status: Former Smoker    Packs/day: 0.80    Years: 46.00    Types: Cigarettes    Quit date: 05/29/2014  . Smokeless tobacco: Never Used  . Alcohol use 12.6 - 16.8 oz/week    7 Glasses of wine, 7 Cans of beer, 7 - 14 Standard drinks or equivalent per week  . Drug use: No  . Sexual activity: Yes   Other Topics Concern  . Not on file   Social History Narrative   Wife recently diagnosed with early stage breast cancer   Wt Readings from Last 3 Encounters:  10/01/15 155 lb 12.8 oz (70.7 kg)  03/01/15 155 lb 11.2 oz (70.6 kg)  10/09/14 152 lb 4.8 oz (69.1 kg)   PHYSICAL EXAM BP 124/80  Pulse (!) 58   Ht '5\' 11"'$  (1.803 m)   Wt 155 lb 12.8 oz (70.7 kg)   BMI 21.73 kg/m  General appearance: alert, cooperative, appears stated age, no distress and Relatively healthy appearing. Normal mood and affect. 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 Mild interstitial sounds Heart: RRR, S1& S2 normal, no murmur, click, rub or gallop, normal apical impulse and Well-healed sternotomy scar Abdomen: soft, non-tender; bowel sounds normal; no masses,  no organomegaly Extremities: extremities normal, atraumatic, no cyanosis or edema Pulses: 2+ and symmetric Skin: Skin color, texture, turgor normal. No rashes or lesions Neurologic: Alert and oriented X 3, normal strength and tone. Normal symmetric reflexes. Normal coordination and gait   Adult ECG Report    Sinus bradycardia, rate 58 BPM. Otherwise normal axis, intervals and durations. Normal EKG  Recent Labs:  April 2017 with PCP.  Na+ 139, K+ 4.4, Cl- 102, HCO3- 31 , BUN 23, Cr 0.91, Glu 87, Ca2+ 9.4; AST 23, ALT 20, AlkP 69, Alb 4.1, TP 6.7, T Bili 0.6  TC 143, TG 130, HDL 59, LDL 58   ASSESSMENT / PLAN: Doing quite well status post CABG. improved EF follow-up echo.  Problem List Items Addressed This Visit    Hyperlipidemia with target LDL less than 70 (Chronic)    From April: TC 143, TG 130, HDL 59, LDL 58. Excellent control on high-dose statin. I think were fine reducing to 40 mg by mouth daily.  Follow-up lipids in 3 months to ensure that there is no change in trend. Otherwise would continue annual follow-up with PCP.      Relevant Medications   atorvastatin (LIPITOR) 40 MG tablet   Other Relevant Orders   Lipid panel   Comprehensive metabolic panel   Former heavy tobacco smoker - quit when he had diagnoses of CAD (Chronic)    Smoking cessation instruction/counseling given:  commended patient for quitting and reviewed strategies for preventing relapses.   He is now 16 months out from his last cigarette, and feeling much better. He has no inclination whatsoever to return to smoking.      Coronary artery disease involving native coronary artery with angina pectoris ([place]) - Primary (Chronic)    Multivessel disease on catheterization found after an abnormal EKG with a stress test. Now status post CABG doing well with no adverse effects. Continues on the maintenance plan for cardiac rehabilitation. He is on ARB, carvedilol and high-dose statin.  No change      Relevant Medications   atorvastatin (LIPITOR) 40 MG tablet   Other Relevant Orders   Lipid panel   Comprehensive metabolic panel   Cardiomyopathy, ischemic: EF ~35-45% by LV Gram --> 50 and 55% by echo (Chronic)    Essentially resolved status post CABG follow-up echo. No heart failure symptoms. On about as high dose  of carvedilol on ARB to get a morning. No diuretic requirement, as he is not having any heart failure symptoms.      Relevant Medications   atorvastatin (LIPITOR) 40 MG tablet   Other Relevant Orders   Lipid panel   Comprehensive metabolic panel    Other Visit Diagnoses   None.    OK to continue with Cardiac Rehab Maintenance Program  MEDICATION CHANGES DECREASE ATORVASTATIN TO 40 MG ( MAY TAKE 1/2 TABLET OF 80  MG TABLET.) CONTACT OFFICE WHEN REFILL IS NEEDED.  PLEASE HAVE LABS--LIPID,CMP--- DONE IN 3 MONTHS - WILL MAIL LAB SLIP TO YOU.   Your physician wants you to follow-up in: [place].  Meds ordered this encounter  Medications  . atorvastatin (LIPITOR) 40 MG tablet    Sig: Take 1 tablet (40 mg total) by mouth daily.    Dispense:  90 tablet    Refill:  3     Glenetta Hew, M.D., M.S. Interventional Cardiologist   Pager # [phone] Phone # [phone] [address] [place], [place] [address]

## 2015-10-01 ENCOUNTER — Ambulatory Visit (INDEPENDENT_AMBULATORY_CARE_PROVIDER_SITE_OTHER): Payer: BLUE CROSS/BLUE SHIELD | Admitting: Cardiology

## 2015-10-01 ENCOUNTER — Encounter: Payer: Self-pay | Admitting: Cardiology

## 2015-10-01 VITALS — BP 124/80 | HR 58 | Ht 71.0 in | Wt 155.8 lb

## 2015-10-01 DIAGNOSIS — E785 Hyperlipidemia, unspecified: Secondary | ICD-10-CM

## 2015-10-01 DIAGNOSIS — Z87891 Personal history of nicotine dependence: Secondary | ICD-10-CM

## 2015-10-01 DIAGNOSIS — I255 Ischemic cardiomyopathy: Secondary | ICD-10-CM | POA: Diagnosis not present

## 2015-10-01 DIAGNOSIS — I25119 Atherosclerotic heart disease of native coronary artery with unspecified angina pectoris: Secondary | ICD-10-CM

## 2015-10-01 MED ORDER — ATORVASTATIN CALCIUM 40 MG PO TABS: 40.0000 mg | ORAL_TABLET | Freq: Every day | ORAL | 3 refills | Status: DC

## 2015-10-01 NOTE — Assessment & Plan Note (Signed)
Multivessel disease on catheterization found after an abnormal EKG with a stress test. Now status post CABG doing well with no adverse effects. Continues on the maintenance plan for cardiac rehabilitation. He is on ARB, carvedilol and high-dose statin.  No change

## 2015-10-01 NOTE — Assessment & Plan Note (Signed)
Smoking cessation instruction/counseling given:  commended patient for quitting and reviewed strategies for preventing relapses.   He is now 16 months out from his last cigarette, and feeling much better. He has no inclination whatsoever to return to smoking.

## 2015-10-01 NOTE — Patient Instructions (Addendum)
MEDICATION CHANGES DECREASE ATORVASTATIN TO 40 MG ( MAY TAKE 1/2 TABLET OF 80 MG TABLET.) CONTACT OFFICE WHEN REFILL IS NEEDED.  PLEASE HAVE LABS--LIPID,CMP--- DONE IN 3 MONTHS - WILL MAIL LAB SLIP TO YOU.   Your physician wants you to follow-up in: [place].You will receive a reminder letter in the mail two months in advance. If you don't receive a letter, please call our office to schedule the follow-up appointment.   If you need a refill on your cardiac medications before your next appointment, please call your pharmacy.

## 2015-10-01 NOTE — Assessment & Plan Note (Signed)
From April: TC 143, TG 130, HDL 59, LDL 58. Excellent control on high-dose statin. I think were fine reducing to 40 mg by mouth daily.  Follow-up lipids in 3 months to ensure that there is no change in trend. Otherwise would continue annual follow-up with PCP.

## 2015-10-01 NOTE — Assessment & Plan Note (Signed)
Essentially resolved status post CABG follow-up echo. No heart failure symptoms. On about as high dose of carvedilol on ARB to get a morning. No diuretic requirement, as he is not having any heart failure symptoms.

## 2015-10-05 ENCOUNTER — Encounter (HOSPITAL_COMMUNITY): Payer: Self-pay

## 2015-10-05 DIAGNOSIS — Z951 Presence of aortocoronary bypass graft: Secondary | ICD-10-CM | POA: Insufficient documentation

## 2015-10-05 NOTE — Addendum Note (Signed)
Addended by: Raiford Simmonds on: 10/05/2015 11:39 AM   Modules accepted: Orders

## 2015-10-06 ENCOUNTER — Encounter (HOSPITAL_COMMUNITY)
Admission: RE | Admit: 2015-10-06 | Discharge: 2015-10-06 | Disposition: A | Discharge status: Home / Self Care | Payer: Self-pay | Source: Ambulatory Visit | Attending: Cardiology | Admitting: Cardiology

## 2015-10-07 ENCOUNTER — Encounter (HOSPITAL_COMMUNITY): Payer: Self-pay

## 2015-10-08 ENCOUNTER — Encounter (HOSPITAL_COMMUNITY)
Admission: RE | Admit: 2015-10-08 | Discharge: 2015-10-08 | Disposition: A | Discharge status: Home / Self Care | Payer: Self-pay | Source: Ambulatory Visit | Attending: Cardiology | Admitting: Cardiology

## 2015-10-11 ENCOUNTER — Encounter (HOSPITAL_COMMUNITY): Payer: Self-pay

## 2015-10-12 ENCOUNTER — Encounter (HOSPITAL_COMMUNITY): Payer: Self-pay

## 2015-10-13 ENCOUNTER — Encounter (HOSPITAL_COMMUNITY)
Admission: RE | Admit: 2015-10-13 | Discharge: 2015-10-13 | Disposition: A | Discharge status: Home / Self Care | Payer: Self-pay | Source: Ambulatory Visit | Attending: Cardiology | Admitting: Cardiology

## 2015-10-14 ENCOUNTER — Encounter (HOSPITAL_COMMUNITY): Payer: Self-pay

## 2015-10-18 ENCOUNTER — Encounter (HOSPITAL_COMMUNITY): Payer: Self-pay

## 2015-10-19 ENCOUNTER — Encounter (HOSPITAL_COMMUNITY): Payer: Self-pay

## 2015-10-21 ENCOUNTER — Encounter (HOSPITAL_COMMUNITY): Payer: Self-pay

## 2015-10-25 ENCOUNTER — Encounter (HOSPITAL_COMMUNITY)
Admission: RE | Admit: 2015-10-25 | Discharge: 2015-10-25 | Disposition: A | Discharge status: Home / Self Care | Payer: Self-pay | Source: Ambulatory Visit | Attending: Cardiology | Admitting: Cardiology

## 2015-10-26 ENCOUNTER — Encounter (HOSPITAL_COMMUNITY): Payer: Self-pay

## 2015-10-28 ENCOUNTER — Encounter (HOSPITAL_COMMUNITY): Payer: Self-pay

## 2015-11-01 ENCOUNTER — Encounter (HOSPITAL_COMMUNITY)
Admission: RE | Admit: 2015-11-01 | Discharge: 2015-11-01 | Disposition: A | Discharge status: Home / Self Care | Payer: Self-pay | Source: Ambulatory Visit | Attending: Cardiology | Admitting: Cardiology

## 2015-11-01 DIAGNOSIS — Z951 Presence of aortocoronary bypass graft: Secondary | ICD-10-CM | POA: Insufficient documentation

## 2015-11-02 ENCOUNTER — Encounter (HOSPITAL_COMMUNITY): Payer: Self-pay

## 2015-11-03 ENCOUNTER — Encounter (HOSPITAL_COMMUNITY)
Admission: RE | Admit: 2015-11-03 | Discharge: 2015-11-03 | Disposition: A | Discharge status: Home / Self Care | Payer: Self-pay | Source: Ambulatory Visit | Attending: Cardiology | Admitting: Cardiology

## 2015-11-04 ENCOUNTER — Encounter (HOSPITAL_COMMUNITY): Payer: Self-pay

## 2015-11-05 ENCOUNTER — Encounter (HOSPITAL_COMMUNITY)
Admission: RE | Admit: 2015-11-05 | Discharge: 2015-11-05 | Disposition: A | Discharge status: Home / Self Care | Payer: Self-pay | Source: Ambulatory Visit | Attending: Cardiology | Admitting: Cardiology

## 2015-11-08 ENCOUNTER — Encounter (HOSPITAL_COMMUNITY)
Admission: RE | Admit: 2015-11-08 | Discharge: 2015-11-08 | Disposition: A | Discharge status: Home / Self Care | Payer: Self-pay | Source: Ambulatory Visit | Attending: Cardiology | Admitting: Cardiology

## 2015-11-09 ENCOUNTER — Encounter (HOSPITAL_COMMUNITY): Payer: Self-pay

## 2015-11-10 ENCOUNTER — Encounter (HOSPITAL_COMMUNITY)
Admission: RE | Admit: 2015-11-10 | Discharge: 2015-11-10 | Disposition: A | Discharge status: Home / Self Care | Payer: Self-pay | Source: Ambulatory Visit | Attending: Cardiology | Admitting: Cardiology

## 2015-11-11 ENCOUNTER — Encounter (HOSPITAL_COMMUNITY): Payer: Self-pay

## 2015-11-12 ENCOUNTER — Encounter (HOSPITAL_COMMUNITY)
Admission: RE | Admit: 2015-11-12 | Discharge: 2015-11-12 | Disposition: A | Discharge status: Home / Self Care | Payer: Self-pay | Source: Ambulatory Visit | Attending: Cardiology | Admitting: Cardiology

## 2015-11-15 ENCOUNTER — Encounter (HOSPITAL_COMMUNITY)
Admission: RE | Admit: 2015-11-15 | Discharge: 2015-11-15 | Disposition: A | Discharge status: Home / Self Care | Payer: Self-pay | Source: Ambulatory Visit | Attending: Cardiology | Admitting: Cardiology

## 2015-11-16 ENCOUNTER — Encounter (HOSPITAL_COMMUNITY): Payer: Self-pay

## 2015-11-17 ENCOUNTER — Encounter (HOSPITAL_COMMUNITY)
Admission: RE | Admit: 2015-11-17 | Discharge: 2015-11-17 | Disposition: A | Discharge status: Home / Self Care | Payer: Self-pay | Source: Ambulatory Visit | Attending: Cardiology | Admitting: Cardiology

## 2015-11-18 ENCOUNTER — Encounter (HOSPITAL_COMMUNITY): Payer: Self-pay

## 2015-11-19 ENCOUNTER — Encounter (HOSPITAL_COMMUNITY)
Admission: RE | Admit: 2015-11-19 | Discharge: 2015-11-19 | Disposition: A | Discharge status: Home / Self Care | Payer: Self-pay | Source: Ambulatory Visit | Attending: Cardiology | Admitting: Cardiology

## 2015-11-22 ENCOUNTER — Encounter (HOSPITAL_COMMUNITY)
Admission: RE | Admit: 2015-11-22 | Discharge: 2015-11-22 | Disposition: A | Discharge status: Home / Self Care | Payer: Self-pay | Source: Ambulatory Visit | Attending: Cardiology | Admitting: Cardiology

## 2015-11-23 ENCOUNTER — Encounter (HOSPITAL_COMMUNITY): Payer: Self-pay

## 2015-11-24 ENCOUNTER — Encounter (HOSPITAL_COMMUNITY)
Admission: RE | Admit: 2015-11-24 | Discharge: 2015-11-24 | Disposition: A | Discharge status: Home / Self Care | Payer: Self-pay | Source: Ambulatory Visit | Attending: Cardiology | Admitting: Cardiology

## 2015-11-25 ENCOUNTER — Encounter (HOSPITAL_COMMUNITY): Payer: Self-pay

## 2015-11-26 ENCOUNTER — Encounter (HOSPITAL_COMMUNITY)
Admission: RE | Admit: 2015-11-26 | Discharge: 2015-11-26 | Disposition: A | Discharge status: Home / Self Care | Payer: Self-pay | Source: Ambulatory Visit | Attending: Cardiology | Admitting: Cardiology

## 2015-11-29 ENCOUNTER — Encounter (HOSPITAL_COMMUNITY)
Admission: RE | Admit: 2015-11-29 | Discharge: 2015-11-29 | Disposition: A | Discharge status: Home / Self Care | Payer: Self-pay | Source: Ambulatory Visit | Attending: Cardiology | Admitting: Cardiology

## 2015-11-30 ENCOUNTER — Encounter (HOSPITAL_COMMUNITY): Payer: Self-pay

## 2015-12-01 ENCOUNTER — Encounter (HOSPITAL_COMMUNITY)
Admission: RE | Admit: 2015-12-01 | Discharge: 2015-12-01 | Disposition: A | Discharge status: Home / Self Care | Payer: Self-pay | Source: Ambulatory Visit | Attending: Cardiology | Admitting: Cardiology

## 2015-12-01 DIAGNOSIS — Z951 Presence of aortocoronary bypass graft: Secondary | ICD-10-CM | POA: Insufficient documentation

## 2015-12-03 ENCOUNTER — Encounter (HOSPITAL_COMMUNITY)
Admission: RE | Admit: 2015-12-03 | Discharge: 2015-12-03 | Disposition: A | Discharge status: Home / Self Care | Payer: Self-pay | Source: Ambulatory Visit | Attending: Cardiology | Admitting: Cardiology

## 2015-12-06 ENCOUNTER — Encounter (HOSPITAL_COMMUNITY)
Admission: RE | Admit: 2015-12-06 | Discharge: 2015-12-06 | Disposition: A | Discharge status: Home / Self Care | Payer: Self-pay | Source: Ambulatory Visit | Attending: Cardiology | Admitting: Cardiology

## 2015-12-08 ENCOUNTER — Encounter (HOSPITAL_COMMUNITY)
Admission: RE | Admit: 2015-12-08 | Discharge: 2015-12-08 | Disposition: A | Discharge status: Home / Self Care | Payer: Self-pay | Source: Ambulatory Visit | Attending: Cardiology | Admitting: Cardiology

## 2015-12-10 ENCOUNTER — Encounter (HOSPITAL_COMMUNITY)
Admission: RE | Admit: 2015-12-10 | Discharge: 2015-12-10 | Disposition: A | Discharge status: Home / Self Care | Payer: Self-pay | Source: Ambulatory Visit | Attending: Cardiology | Admitting: Cardiology

## 2015-12-13 ENCOUNTER — Encounter (HOSPITAL_COMMUNITY)
Admission: RE | Admit: 2015-12-13 | Discharge: 2015-12-13 | Disposition: A | Discharge status: Home / Self Care | Payer: Self-pay | Source: Ambulatory Visit | Attending: Cardiology | Admitting: Cardiology

## 2015-12-15 ENCOUNTER — Encounter (HOSPITAL_COMMUNITY)
Admission: RE | Admit: 2015-12-15 | Discharge: 2015-12-15 | Disposition: A | Discharge status: Home / Self Care | Payer: Self-pay | Source: Ambulatory Visit | Attending: Cardiology | Admitting: Cardiology

## 2015-12-17 ENCOUNTER — Encounter (HOSPITAL_COMMUNITY)
Admission: RE | Admit: 2015-12-17 | Discharge: 2015-12-17 | Disposition: A | Discharge status: Home / Self Care | Payer: Self-pay | Source: Ambulatory Visit | Attending: Cardiology | Admitting: Cardiology

## 2015-12-20 ENCOUNTER — Encounter (HOSPITAL_COMMUNITY)
Admission: RE | Admit: 2015-12-20 | Discharge: 2015-12-20 | Disposition: A | Discharge status: Home / Self Care | Payer: Self-pay | Source: Ambulatory Visit | Attending: Cardiology | Admitting: Cardiology

## 2015-12-22 ENCOUNTER — Telehealth: Payer: Self-pay | Admitting: *Deleted

## 2015-12-22 ENCOUNTER — Encounter (HOSPITAL_COMMUNITY)
Admission: RE | Admit: 2015-12-22 | Discharge: 2015-12-22 | Disposition: A | Discharge status: Home / Self Care | Payer: Self-pay | Source: Ambulatory Visit | Attending: Cardiology | Admitting: Cardiology

## 2015-12-22 DIAGNOSIS — I255 Ischemic cardiomyopathy: Secondary | ICD-10-CM

## 2015-12-22 DIAGNOSIS — I25119 Atherosclerotic heart disease of native coronary artery with unspecified angina pectoris: Secondary | ICD-10-CM

## 2015-12-22 DIAGNOSIS — E785 Hyperlipidemia, unspecified: Secondary | ICD-10-CM

## 2015-12-22 NOTE — Telephone Encounter (Signed)
-----   Message from Raiford Simmonds, RN sent at 10/01/2015  9:07 AM EDT ----- MAY LETTER AND LAB SLIP CMP LIPID DUE 12/31/15.

## 2015-12-22 NOTE — Telephone Encounter (Signed)
Mail letter and labslip  

## 2015-12-27 ENCOUNTER — Encounter (HOSPITAL_COMMUNITY)
Admission: RE | Admit: 2015-12-27 | Discharge: 2015-12-27 | Disposition: A | Discharge status: Home / Self Care | Payer: Self-pay | Source: Ambulatory Visit | Attending: Cardiology | Admitting: Cardiology

## 2015-12-29 ENCOUNTER — Encounter (HOSPITAL_COMMUNITY)
Admission: RE | Admit: 2015-12-29 | Discharge: 2015-12-29 | Disposition: A | Discharge status: Home / Self Care | Payer: Self-pay | Source: Ambulatory Visit | Attending: Cardiology | Admitting: Cardiology

## 2015-12-31 ENCOUNTER — Encounter (HOSPITAL_COMMUNITY)
Admission: RE | Admit: 2015-12-31 | Discharge: 2015-12-31 | Disposition: A | Discharge status: Home / Self Care | Payer: Self-pay | Source: Ambulatory Visit | Attending: Cardiology | Admitting: Cardiology

## 2015-12-31 DIAGNOSIS — Z951 Presence of aortocoronary bypass graft: Secondary | ICD-10-CM | POA: Insufficient documentation

## 2016-01-03 ENCOUNTER — Encounter (HOSPITAL_COMMUNITY)
Admission: RE | Admit: 2016-01-03 | Discharge: 2016-01-03 | Disposition: A | Discharge status: Home / Self Care | Payer: Self-pay | Source: Ambulatory Visit | Attending: Cardiology | Admitting: Cardiology

## 2016-01-03 LAB — COMPREHENSIVE METABOLIC PANEL
ALBUMIN: 3.8 g/dL (ref 3.6–5.1)
ALK PHOS: 65 U/L (ref 40–115)
ALT: 19 U/L (ref 9–46)
AST: 21 U/L (ref 10–35)
BUN: 21 mg/dL (ref 7–25)
CHLORIDE: 103 mmol/L (ref 98–110)
CO2: 28 mmol/L (ref 20–31)
CREATININE: 0.85 mg/dL (ref 0.70–1.25)
Calcium: 8.8 mg/dL (ref 8.6–10.3)
Glucose, Bld: 89 mg/dL (ref 65–99)
POTASSIUM: 4.3 mmol/L (ref 3.5–5.3)
Sodium: 139 mmol/L (ref 135–146)
TOTAL PROTEIN: 6.1 g/dL (ref 6.1–8.1)
Total Bilirubin: 0.6 mg/dL (ref 0.2–1.2)

## 2016-01-03 LAB — LIPID PANEL
Cholesterol: 107 mg/dL (ref <200)
HDL: 51 mg/dL (ref >40)
LDL Cholesterol: 46 mg/dL (ref <100)
TRIGLYCERIDES: 49 mg/dL (ref <150)
Total CHOL/HDL Ratio: 2.1 Ratio (ref <5.0)
VLDL: 10 mg/dL (ref <30)

## 2016-01-05 ENCOUNTER — Encounter (HOSPITAL_COMMUNITY)
Admission: RE | Admit: 2016-01-05 | Discharge: 2016-01-05 | Disposition: A | Discharge status: Home / Self Care | Payer: Self-pay | Source: Ambulatory Visit | Attending: Cardiology | Admitting: Cardiology

## 2016-01-07 ENCOUNTER — Encounter (HOSPITAL_COMMUNITY)
Admission: RE | Admit: 2016-01-07 | Discharge: 2016-01-07 | Disposition: A | Discharge status: Home / Self Care | Payer: Self-pay | Source: Ambulatory Visit | Attending: Cardiology | Admitting: Cardiology

## 2016-01-10 ENCOUNTER — Encounter (HOSPITAL_COMMUNITY)
Admission: RE | Admit: 2016-01-10 | Discharge: 2016-01-10 | Disposition: A | Discharge status: Home / Self Care | Payer: Self-pay | Source: Ambulatory Visit | Attending: Cardiology | Admitting: Cardiology

## 2016-01-10 ENCOUNTER — Telehealth: Payer: Self-pay | Admitting: *Deleted

## 2016-01-10 MED ORDER — ATORVASTATIN CALCIUM 40 MG PO TABS: ORAL_TABLET | ORAL | 3 refills | Status: DC

## 2016-01-10 NOTE — Telephone Encounter (Signed)
LEFT MESSAGE ON SECURED CELL VOICE MAIL.  CORRECTED MEDICATION LIST.  PATIENT REVIEWED INFORMATION ON MYCHART.  ANY QUESTION MAY CALL BACK

## 2016-01-10 NOTE — Telephone Encounter (Signed)
-----   Message from Leonie Man, MD sent at 01/05/2016  5:44 PM EST ----- Lipid panel looks outstanding. I think were fine with the current dose of atorvastatin, but think we can probably reduce down to 20 mg -- what we should do for now is every other day to 20 mg and then 40 mg.   If labs look stable on the next check, we can probably stay 20 mg daily.  Chem she panel looks pretty good. Glucose level is still high, better. Liver function and kidney function are stable.  Glenetta Hew, MD

## 2016-01-11 NOTE — Telephone Encounter (Signed)
Patient wanted clarification of dosage of atorvastatin.  patient has 40 mg tablets at present time. He states he has a 6 month supply. He verbalized understanding to alternate 20 /40 mg every other daily.

## 2016-01-12 ENCOUNTER — Encounter (HOSPITAL_COMMUNITY): Payer: Self-pay

## 2016-01-14 ENCOUNTER — Encounter (HOSPITAL_COMMUNITY)
Admission: RE | Admit: 2016-01-14 | Discharge: 2016-01-14 | Disposition: A | Discharge status: Home / Self Care | Payer: Self-pay | Source: Ambulatory Visit | Attending: Cardiology | Admitting: Cardiology

## 2016-01-17 ENCOUNTER — Encounter (HOSPITAL_COMMUNITY)
Admission: RE | Admit: 2016-01-17 | Discharge: 2016-01-17 | Disposition: A | Discharge status: Home / Self Care | Payer: Self-pay | Source: Ambulatory Visit | Attending: Cardiology | Admitting: Cardiology

## 2016-01-19 ENCOUNTER — Encounter (HOSPITAL_COMMUNITY)
Admission: RE | Admit: 2016-01-19 | Discharge: 2016-01-19 | Disposition: A | Discharge status: Home / Self Care | Payer: Self-pay | Source: Ambulatory Visit | Attending: Cardiology | Admitting: Cardiology

## 2016-01-21 ENCOUNTER — Encounter (HOSPITAL_COMMUNITY)
Admission: RE | Admit: 2016-01-21 | Discharge: 2016-01-21 | Disposition: A | Discharge status: Home / Self Care | Payer: Self-pay | Source: Ambulatory Visit | Attending: Cardiology | Admitting: Cardiology

## 2016-01-26 ENCOUNTER — Encounter (HOSPITAL_COMMUNITY)
Admission: RE | Admit: 2016-01-26 | Discharge: 2016-01-26 | Disposition: A | Discharge status: Home / Self Care | Payer: Self-pay | Source: Ambulatory Visit | Attending: Cardiology | Admitting: Cardiology

## 2016-01-28 ENCOUNTER — Encounter (HOSPITAL_COMMUNITY)
Admission: RE | Admit: 2016-01-28 | Discharge: 2016-01-28 | Disposition: A | Discharge status: Home / Self Care | Payer: Self-pay | Source: Ambulatory Visit | Attending: Cardiology | Admitting: Cardiology

## 2016-02-02 ENCOUNTER — Encounter (HOSPITAL_COMMUNITY)
Admission: RE | Admit: 2016-02-02 | Discharge: 2016-02-02 | Disposition: A | Discharge status: Home / Self Care | Payer: Self-pay | Source: Ambulatory Visit | Attending: Cardiology | Admitting: Cardiology

## 2016-02-02 DIAGNOSIS — Z951 Presence of aortocoronary bypass graft: Secondary | ICD-10-CM | POA: Insufficient documentation

## 2016-02-04 ENCOUNTER — Encounter (HOSPITAL_COMMUNITY)
Admission: RE | Admit: 2016-02-04 | Discharge: 2016-02-04 | Disposition: A | Discharge status: Home / Self Care | Payer: Self-pay | Source: Ambulatory Visit | Attending: Cardiology | Admitting: Cardiology

## 2016-02-07 ENCOUNTER — Encounter (HOSPITAL_COMMUNITY)
Admission: RE | Admit: 2016-02-07 | Discharge: 2016-02-07 | Disposition: A | Discharge status: Home / Self Care | Payer: Self-pay | Source: Ambulatory Visit | Attending: Cardiology | Admitting: Cardiology

## 2016-02-09 ENCOUNTER — Encounter (HOSPITAL_COMMUNITY)
Admission: RE | Admit: 2016-02-09 | Discharge: 2016-02-09 | Disposition: A | Discharge status: Home / Self Care | Payer: Self-pay | Source: Ambulatory Visit | Attending: Cardiology | Admitting: Cardiology

## 2016-02-11 ENCOUNTER — Encounter (HOSPITAL_COMMUNITY)
Admission: RE | Admit: 2016-02-11 | Discharge: 2016-02-11 | Disposition: A | Discharge status: Home / Self Care | Payer: Self-pay | Source: Ambulatory Visit | Attending: Cardiology | Admitting: Cardiology

## 2016-02-14 ENCOUNTER — Encounter (HOSPITAL_COMMUNITY)
Admission: RE | Admit: 2016-02-14 | Discharge: 2016-02-14 | Disposition: A | Discharge status: Home / Self Care | Payer: Self-pay | Source: Ambulatory Visit | Attending: Cardiology | Admitting: Cardiology

## 2016-02-16 ENCOUNTER — Encounter (HOSPITAL_COMMUNITY): Payer: Self-pay

## 2016-02-18 ENCOUNTER — Encounter (HOSPITAL_COMMUNITY): Admission: RE | Admit: 2016-02-18 | Payer: Self-pay | Source: Ambulatory Visit

## 2016-02-21 ENCOUNTER — Encounter (HOSPITAL_COMMUNITY)
Admission: RE | Admit: 2016-02-21 | Discharge: 2016-02-21 | Disposition: A | Discharge status: Home / Self Care | Payer: Self-pay | Source: Ambulatory Visit | Attending: Cardiology | Admitting: Cardiology

## 2016-02-23 ENCOUNTER — Other Ambulatory Visit: Payer: Self-pay | Admitting: *Deleted

## 2016-02-23 ENCOUNTER — Encounter (HOSPITAL_COMMUNITY): Payer: Self-pay

## 2016-02-23 MED ORDER — SILDENAFIL CITRATE 50 MG PO TABS: 50.0000 mg | ORAL_TABLET | Freq: Every day | ORAL | 5 refills | Status: AC | PRN

## 2016-02-23 NOTE — Telephone Encounter (Signed)
Patient in office with wife. Per Dr Ellyn Hack, prescription e-sent to pharmacy.  Patient aware  Will send to local pharmacy first if effective will be glad to send to mail order.

## 2016-02-25 ENCOUNTER — Encounter (HOSPITAL_COMMUNITY)
Admission: RE | Admit: 2016-02-25 | Discharge: 2016-02-25 | Disposition: A | Discharge status: Home / Self Care | Payer: Self-pay | Source: Ambulatory Visit | Attending: Cardiology | Admitting: Cardiology

## 2016-02-28 ENCOUNTER — Encounter (HOSPITAL_COMMUNITY)
Admission: RE | Admit: 2016-02-28 | Discharge: 2016-02-28 | Disposition: A | Discharge status: Home / Self Care | Payer: Self-pay | Source: Ambulatory Visit | Attending: Cardiology | Admitting: Cardiology

## 2016-03-01 ENCOUNTER — Encounter (HOSPITAL_COMMUNITY)
Admission: RE | Admit: 2016-03-01 | Discharge: 2016-03-01 | Disposition: A | Discharge status: Home / Self Care | Payer: Self-pay | Source: Ambulatory Visit | Attending: Cardiology | Admitting: Cardiology

## 2016-03-03 ENCOUNTER — Encounter (HOSPITAL_COMMUNITY)
Admission: RE | Admit: 2016-03-03 | Discharge: 2016-03-03 | Disposition: A | Discharge status: Home / Self Care | Payer: Self-pay | Source: Ambulatory Visit | Attending: Cardiology | Admitting: Cardiology

## 2016-03-03 DIAGNOSIS — Z951 Presence of aortocoronary bypass graft: Secondary | ICD-10-CM | POA: Insufficient documentation

## 2016-03-06 ENCOUNTER — Encounter (HOSPITAL_COMMUNITY)
Admission: RE | Admit: 2016-03-06 | Discharge: 2016-03-06 | Disposition: A | Discharge status: Home / Self Care | Payer: Self-pay | Source: Ambulatory Visit | Attending: Cardiology | Admitting: Cardiology

## 2016-03-08 ENCOUNTER — Encounter (HOSPITAL_COMMUNITY)
Admission: RE | Admit: 2016-03-08 | Discharge: 2016-03-08 | Disposition: A | Discharge status: Home / Self Care | Payer: Self-pay | Source: Ambulatory Visit | Attending: Cardiology | Admitting: Cardiology

## 2016-03-10 ENCOUNTER — Encounter (HOSPITAL_COMMUNITY)
Admission: RE | Admit: 2016-03-10 | Discharge: 2016-03-10 | Disposition: A | Discharge status: Home / Self Care | Payer: Self-pay | Source: Ambulatory Visit | Attending: Cardiology | Admitting: Cardiology

## 2016-03-13 ENCOUNTER — Encounter (HOSPITAL_COMMUNITY)
Admission: RE | Admit: 2016-03-13 | Discharge: 2016-03-13 | Disposition: A | Discharge status: Home / Self Care | Payer: Self-pay | Source: Ambulatory Visit | Attending: Cardiology | Admitting: Cardiology

## 2016-03-15 ENCOUNTER — Encounter (HOSPITAL_COMMUNITY)
Admission: RE | Admit: 2016-03-15 | Discharge: 2016-03-15 | Disposition: A | Discharge status: Home / Self Care | Payer: Self-pay | Source: Ambulatory Visit | Attending: Cardiology | Admitting: Cardiology

## 2016-03-17 ENCOUNTER — Encounter (HOSPITAL_COMMUNITY)
Admission: RE | Admit: 2016-03-17 | Discharge: 2016-03-17 | Disposition: A | Discharge status: Home / Self Care | Payer: Self-pay | Source: Ambulatory Visit | Attending: Cardiology | Admitting: Cardiology

## 2016-03-20 ENCOUNTER — Encounter (HOSPITAL_COMMUNITY): Payer: Self-pay

## 2016-03-22 ENCOUNTER — Encounter (HOSPITAL_COMMUNITY)
Admission: RE | Admit: 2016-03-22 | Discharge: 2016-03-22 | Disposition: A | Discharge status: Home / Self Care | Payer: Self-pay | Source: Ambulatory Visit | Attending: Cardiology | Admitting: Cardiology

## 2016-03-24 ENCOUNTER — Encounter (HOSPITAL_COMMUNITY)
Admission: RE | Admit: 2016-03-24 | Discharge: 2016-03-24 | Disposition: A | Discharge status: Home / Self Care | Payer: Self-pay | Source: Ambulatory Visit | Attending: Cardiology | Admitting: Cardiology

## 2016-03-27 ENCOUNTER — Encounter (HOSPITAL_COMMUNITY)
Admission: RE | Admit: 2016-03-27 | Discharge: 2016-03-27 | Disposition: A | Discharge status: Home / Self Care | Payer: Self-pay | Source: Ambulatory Visit | Attending: Cardiology | Admitting: Cardiology

## 2016-03-29 ENCOUNTER — Encounter: Payer: Self-pay | Admitting: Cardiology

## 2016-03-29 ENCOUNTER — Ambulatory Visit (INDEPENDENT_AMBULATORY_CARE_PROVIDER_SITE_OTHER): Payer: BLUE CROSS/BLUE SHIELD | Admitting: Cardiology

## 2016-03-29 ENCOUNTER — Encounter (HOSPITAL_COMMUNITY): Payer: Self-pay

## 2016-03-29 DIAGNOSIS — I255 Ischemic cardiomyopathy: Secondary | ICD-10-CM

## 2016-03-29 DIAGNOSIS — I25119 Atherosclerotic heart disease of native coronary artery with unspecified angina pectoris: Secondary | ICD-10-CM

## 2016-03-29 DIAGNOSIS — E785 Hyperlipidemia, unspecified: Secondary | ICD-10-CM

## 2016-03-29 DIAGNOSIS — I209 Angina pectoris, unspecified: Secondary | ICD-10-CM | POA: Diagnosis not present

## 2016-03-29 NOTE — Assessment & Plan Note (Addendum)
Resolved post-CABG.  No further symptoms of exertional CP/DOE with notably increased intensity of workouts - when compared to pre-CABG He asked if it is OK for him to jog/run instead of walk -- certainly.

## 2016-03-29 NOTE — Patient Instructions (Signed)
NO CHANGE WITH CURRENT MEDICATIONS   Your physician wants you to follow-up in 6 MONTHS WITH DR HARDING.  You will receive a reminder letter in the mail two months in advance. If you don't receive a letter, please call our office to schedule the follow-up appointment.  If you need a refill on your cardiac medications before your next appointment, please call your pharmacy.   

## 2016-03-29 NOTE — Assessment & Plan Note (Addendum)
Multivessel CAD on cath. Referred for CABG with no current symptoms. He is on moderate dose statin along with aspirin and low-dose beta blocker plus ARB. He has when necessary nitroglycerin prescribed, but not using.

## 2016-03-29 NOTE — Progress Notes (Signed)
PATIENT: Kelly Olson. MRN: [id number] DOB: 01/23/1951 PCP: Irven Shelling, MD  Clinic Note: Chief Complaint  Patient presents with  . Follow-up    6 months; Pt state no Sx.   . Coronary Artery Disease    CABG    HPI: Kelly Olson. is a 66 y.o. male with a PMH below who presents today for f/u s/p CABG x 4 06/04/2014. Uncomplicated post-op course.  Quit smoking the day of his Stress Test after brief discussion with me. He initially had reduced ejection fraction, however follow-up echocardiogram is showing improved EF. His initial presentation was for chest pain and exertional dyspnea just prior to his cardiac catheterization. He underwent Myoview stress test that was read as abnormal/high risk.  This resulted in cardiac catheterization. Prior to this - was walking~7.5 miles/day - but noted mild chest discomfort with HR 118-120 bpm. He says that his shortness of breath is improved as he is gone following from smoking and his bypass surgery.   I last saw him in September 2017. His feeling great at time no major issues. He indicated that he felt better than he had in years. Continued crack rehabilitation and doing extra exercises well. Still doing medications No new hospitalizations or studies to review  Continues Maintenance CRH & walking other days. -- His wife was recently diagnosed with breast cancer and will likely have some lumpectomy as well as XRT plus minus chemotherapy.  No recent hospitalizations or studies.  Interval History: Shalamar presents today as usual continuing to do well.  He has picked up his work-out routine - adding weights & elliptical time on his off days rom [facility].   He says that he is up to > 6 METS @ [place]. He does note that he is not really able to get his heart rate up to watch, but really does not notice any fatigue or dyspnea. Continues to travel now with work - but notably less.  Still vacationing. No heart failure symptoms. No edema. No palpitations  rapid heartbeat. No angina.  The remainder of cardiac review of systems is as follows: Cardiovascular ROS: positive for - hands/feet tingling - like the "are asleep" negative for - chest pain, dyspnea on exertion, edema, irregular heartbeat, loss of consciousness, murmur, orthopnea, palpitations, paroxysmal nocturnal dyspnea, rapid heart rate, shortness of breath or syncope/near syncope, TIA/amaurosis fugax  Minimal smoker's cough left.    ROS: A comprehensive Review of Systems - was performed Review of Systems  Constitutional: Negative for malaise/fatigue.  HENT: Negative for congestion (allergies) and nosebleeds.   Respiratory: Negative for cough (Notably improving - now more related to sinus congestion), shortness of breath and wheezing.   Cardiovascular: Negative.  Negative for claudication.  Gastrointestinal: Negative for blood in stool, heartburn (occasionally - food related) and melena.  Genitourinary: Negative for hematuria.  Musculoskeletal: Positive for joint pain (knees ache). Negative for falls and myalgias.  Neurological: Negative.  Negative for dizziness.  Endo/Heme/Allergies: Positive for environmental allergies. Does not bruise/bleed easily.  Psychiatric/Behavioral: Negative for depression. The patient has insomnia (ccasionally.). The patient is not nervous/anxious.   All other systems reviewed and are negative.   Past Medical History:  Diagnosis Date  . Coronary artery disease, occlusive 06/02/2014   Multivessel CAD.mLAD-100%, mRCA 99%, dRCA 100%, mLCX 90% And EF 35-45%.  . Former heavy tobacco smoker    Quit in April 2016   . Hyperlipidemia with target LDL less than 70   . Ischemic cardiomyopathy - resolved 05/2014  Myoview: EF ~33% with "infarction vs. severe resting ischemia in LAD & RCA territory; b) EF by Cath: 35-45%. c) post CABG Echo 9016: EF 50-55%  . 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 CARDIAC CATHETERIZATION N/A 06/02/2014   Procedure: Left Heart Cath and Coronary Angiography;  Surgeon: Leonie Man, MD;  Location: [facility];  Service: Cardiovascular;  mLAD-100%, mRCA 99%, dRCA 100%, mLCX 90% And EF 35-45%.  . CORONARY ARTERY BYPASS GRAFT N/A 06/04/2014   Procedure: CORONARY ARTERY BYPASS GRAFTING (CABG) times four using bilateral internal mammary arteries and EVH for left  leg saphenous vein;  Surgeon: Gaye Pollack, MD;  [facility];  Service: Open Heart Surgery;  LIMA-OM, RIMA-LAD, SVG-Diag, SVG-rPDA  . NM MYOVIEW LTD  05/28/2014   Pre-CABG:  High Risk Nuclear Stress Test with multivessel distribution ischemia. -- Severely ischemic Cardiomyoapthy with evidence of at least 2 vessel disease & EF of ~33%. Images are consistent with either infarction or severe resting ischemia in the LAD & RCA territory.  . TEE WITHOUT CARDIOVERSION N/A 06/04/2014   Procedure: TRANSESOPHAGEAL ECHOCARDIOGRAM (TEE);  Surgeon: Gaye Pollack, MD;  Location: [facility];  Service: Open Heart Surgery;  Laterality: N/A;  . TONSILLECTOMY  1957  . TRANSTHORACIC ECHOCARDIOGRAM  10/2014   Mild concentric LVH. EF 50-55% with mild HK of basal anteroseptal myocardium. GR 1 DD.   No Known Allergies  Current Meds  Medication Sig  . aspirin 81 MG tablet Take 81 mg by mouth daily.  Marland Kitchen atorvastatin (LIPITOR) 40 MG tablet ALTERNATE 20 MG ( 1/2 TABLET) AND 40 MG ( 1 TABLET) EVERY OTHER DAY  . carvedilol (COREG) 3.125 MG tablet Take 2 tablets (6.25 mg total) by mouth 2 (two) times daily.  Marland Kitchen losartan (COZAAR) 25 MG tablet Take 12.5 mg by mouth daily.  . sildenafil (VIAGRA) 50 MG tablet Take 1 tablet (50 mg total) by mouth daily as needed for erectile dysfunction.  . [DISCONTINUED] aspirin EC 325 MG tablet TAKE 1 TABLET BY MOUTH DAILY    Family History  Problem Relation Age of Onset  . Emphysema Mother   . Cardiomyopathy Father   . Healthy Sister   . Heart attack Maternal  Grandfather   . Healthy Sister   . Heart disease Paternal Uncle    Social History   Social History  . Marital status: Married    Spouse name: N/A  . Number of children: N/A  . Years of education: N/A   Occupational History  . Not on file.   Social History Main Topics  . Smoking status: Former Smoker    Packs/day: 0.80    Years: 46.00    Types: Cigarettes    Quit date: 05/29/2014  . Smokeless tobacco: Never Used  . Alcohol use 12.6 - 16.8 oz/week    7 Glasses of wine, 7 Cans of beer, 7 - 14 Standard drinks or equivalent per week  . Drug use: No  . Sexual activity: Yes   Other Topics Concern  . Not on file   Social History Narrative   Wife recently diagnosed with early stage breast cancer   Wt Readings from Last 3 Encounters:  03/29/16 70.2 kg (154 lb 12.8 oz)  10/01/15 70.7 kg (155 lb 12.8 oz)  03/01/15 70.6 kg (155 lb 11.2 oz)   PHYSICAL EXAM BP 134/81   Pulse 69   Ht 5'  11" (1.803 m)   Wt 70.2 kg (154 lb 12.8 oz)   BMI 21.59 kg/m  General appearance: alert, cooperative, appears stated age, no distress and Relatively healthy appearing. Normal mood and affect. 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 Mild interstitial sounds Heart: RRR, S1& S2 normal, no murmur, click, rub or gallop, normal apical impulse and Well-healed sternotomy scar Abdomen: soft, non-tender; bowel sounds normal; no masses,  no organomegaly Extremities: extremities normal, atraumatic, no cyanosis or edema Pulses: 2+ and symmetric Skin: Skin color, texture, turgor normal. No rashes or lesions Neurologic: Alert and oriented X 3, normal strength and tone. Normal symmetric reflexes. Normal coordination and gait   Adult ECG Report  N/a  PCP to check labs in April.  ASSESSMENT /  PLAN: Doing quite well status post CABG. improved EF follow-up echo.  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  No further symptoms of exertional CP/DOE with notably increased intensity of workouts - when compared to pre-CABG He asked if it is OK for him to jog/run instead of walk -- certainly.        Relevant Medications   aspirin 81 MG tablet   Cardiomyopathy, ischemic: EF ~35-45% by LV Gram --> 50 and 55% by echo (Chronic)     Resolved following revascularization. No heart failure symptoms.      Relevant Medications   aspirin 81 MG tablet   Coronary artery disease involving native coronary artery with angina pectoris (HCC) (Chronic)    Multivessel CAD on cath. Referred for CABG with no current symptoms. He is on moderate dose statin along with aspirin and low-dose beta blocker plus ARB. He has when necessary nitroglycerin prescribed, but not using.      Relevant Medications   aspirin 81 MG tablet   Hyperlipidemia with target LDL less than 70 (Chronic)    Due for checked by his PCP in April. He had excellent role based on his labs in the fall. We had reduced his Lipitor down to 20 mg alternating every day with 40 mg. We may be to go to simply 20 mg daily if his last look stable.      Relevant Medications   aspirin 81 MG tablet     OK to continue with Cardiac Rehab Maintenance Program  Patient Instructions  NO CHANGE WITH CURRENT MEDICATIONS      Your physician wants you to follow-up in [place] DR HARDING. You will receive a reminder letter in the mail two months in advance. If you don't receive a letter, please call our office to schedule the follow-up appointment.     If you need a refill on your cardiac medications before your next appointment, please call your pharmacy.    Meds ordered this encounter  Medications  . aspirin 81 MG tablet    Sig: Take 81 mg by mouth daily.     Glenetta Hew, M.D., M.S. Interventional Cardiologist   Pager # [phone] Phone # [phone] [address] [place], [place] [address]

## 2016-03-29 NOTE — Assessment & Plan Note (Signed)
   Resolved following revascularization. No heart failure symptoms.

## 2016-03-29 NOTE — Assessment & Plan Note (Signed)
Due for checked by his PCP in April. He had excellent role based on his labs in the fall. We had reduced his Lipitor down to 20 mg alternating every day with 40 mg. We may be to go to simply 20 mg daily if his last look stable.

## 2016-03-31 ENCOUNTER — Encounter (HOSPITAL_COMMUNITY)
Admission: RE | Admit: 2016-03-31 | Discharge: 2016-03-31 | Disposition: A | Discharge status: Home / Self Care | Payer: Self-pay | Source: Ambulatory Visit | Attending: Cardiology | Admitting: Cardiology

## 2016-03-31 DIAGNOSIS — Z951 Presence of aortocoronary bypass graft: Secondary | ICD-10-CM | POA: Insufficient documentation

## 2016-04-03 ENCOUNTER — Encounter (HOSPITAL_COMMUNITY)
Admission: RE | Admit: 2016-04-03 | Discharge: 2016-04-03 | Disposition: A | Discharge status: Home / Self Care | Payer: Self-pay | Source: Ambulatory Visit | Attending: Cardiology | Admitting: Cardiology

## 2016-04-05 ENCOUNTER — Encounter (HOSPITAL_COMMUNITY)
Admission: RE | Admit: 2016-04-05 | Discharge: 2016-04-05 | Disposition: A | Discharge status: Home / Self Care | Payer: Self-pay | Source: Ambulatory Visit | Attending: Cardiology | Admitting: Cardiology

## 2016-04-07 ENCOUNTER — Encounter (HOSPITAL_COMMUNITY)
Admission: RE | Admit: 2016-04-07 | Discharge: 2016-04-07 | Disposition: A | Discharge status: Home / Self Care | Payer: Self-pay | Source: Ambulatory Visit | Attending: Cardiology | Admitting: Cardiology

## 2016-04-10 ENCOUNTER — Encounter (HOSPITAL_COMMUNITY)
Admission: RE | Admit: 2016-04-10 | Discharge: 2016-04-10 | Disposition: A | Discharge status: Home / Self Care | Payer: Self-pay | Source: Ambulatory Visit | Attending: Cardiology | Admitting: Cardiology

## 2016-04-12 ENCOUNTER — Encounter (HOSPITAL_COMMUNITY)
Admission: RE | Admit: 2016-04-12 | Discharge: 2016-04-12 | Disposition: A | Discharge status: Home / Self Care | Payer: Self-pay | Source: Ambulatory Visit | Attending: Cardiology | Admitting: Cardiology

## 2016-04-14 ENCOUNTER — Encounter (HOSPITAL_COMMUNITY)
Admission: RE | Admit: 2016-04-14 | Discharge: 2016-04-14 | Disposition: A | Discharge status: Home / Self Care | Payer: Self-pay | Source: Ambulatory Visit | Attending: Cardiology | Admitting: Cardiology

## 2016-04-17 ENCOUNTER — Encounter (HOSPITAL_COMMUNITY): Payer: Self-pay

## 2016-04-19 ENCOUNTER — Encounter (HOSPITAL_COMMUNITY): Payer: Self-pay

## 2016-04-21 ENCOUNTER — Encounter (HOSPITAL_COMMUNITY)
Admission: RE | Admit: 2016-04-21 | Discharge: 2016-04-21 | Disposition: A | Discharge status: Home / Self Care | Payer: Self-pay | Source: Ambulatory Visit | Attending: Cardiology | Admitting: Cardiology

## 2016-04-24 ENCOUNTER — Encounter (HOSPITAL_COMMUNITY)
Admission: RE | Admit: 2016-04-24 | Discharge: 2016-04-24 | Disposition: A | Discharge status: Home / Self Care | Payer: Self-pay | Source: Ambulatory Visit | Attending: Cardiology | Admitting: Cardiology

## 2016-04-26 ENCOUNTER — Encounter (HOSPITAL_COMMUNITY)
Admission: RE | Admit: 2016-04-26 | Discharge: 2016-04-26 | Disposition: A | Discharge status: Home / Self Care | Payer: Self-pay | Source: Ambulatory Visit | Attending: Cardiology | Admitting: Cardiology

## 2016-04-28 ENCOUNTER — Encounter (HOSPITAL_COMMUNITY)
Admission: RE | Admit: 2016-04-28 | Discharge: 2016-04-28 | Disposition: A | Discharge status: Home / Self Care | Payer: Self-pay | Source: Ambulatory Visit | Attending: Cardiology | Admitting: Cardiology

## 2016-05-01 ENCOUNTER — Encounter (HOSPITAL_COMMUNITY)
Admission: RE | Admit: 2016-05-01 | Discharge: 2016-05-01 | Disposition: A | Discharge status: Home / Self Care | Payer: Self-pay | Source: Ambulatory Visit | Attending: Cardiology | Admitting: Cardiology

## 2016-05-01 DIAGNOSIS — Z951 Presence of aortocoronary bypass graft: Secondary | ICD-10-CM | POA: Insufficient documentation

## 2016-05-03 ENCOUNTER — Encounter (HOSPITAL_COMMUNITY)
Admission: RE | Admit: 2016-05-03 | Discharge: 2016-05-03 | Disposition: A | Discharge status: Home / Self Care | Payer: Self-pay | Source: Ambulatory Visit | Attending: Cardiology | Admitting: Cardiology

## 2016-05-05 ENCOUNTER — Encounter (HOSPITAL_COMMUNITY)
Admission: RE | Admit: 2016-05-05 | Discharge: 2016-05-05 | Disposition: A | Discharge status: Home / Self Care | Payer: Self-pay | Source: Ambulatory Visit | Attending: Cardiology | Admitting: Cardiology

## 2016-05-08 ENCOUNTER — Encounter (HOSPITAL_COMMUNITY)
Admission: RE | Admit: 2016-05-08 | Discharge: 2016-05-08 | Disposition: A | Discharge status: Home / Self Care | Payer: Self-pay | Source: Ambulatory Visit | Attending: Cardiology | Admitting: Cardiology

## 2016-05-10 ENCOUNTER — Encounter (HOSPITAL_COMMUNITY)
Admission: RE | Admit: 2016-05-10 | Discharge: 2016-05-10 | Disposition: A | Discharge status: Home / Self Care | Payer: Self-pay | Source: Ambulatory Visit | Attending: Cardiology | Admitting: Cardiology

## 2016-05-12 ENCOUNTER — Encounter (HOSPITAL_COMMUNITY)
Admission: RE | Admit: 2016-05-12 | Discharge: 2016-05-12 | Disposition: A | Discharge status: Home / Self Care | Payer: Self-pay | Source: Ambulatory Visit | Attending: Cardiology | Admitting: Cardiology

## 2016-05-15 ENCOUNTER — Encounter (HOSPITAL_COMMUNITY)
Admission: RE | Admit: 2016-05-15 | Discharge: 2016-05-15 | Disposition: A | Discharge status: Home / Self Care | Payer: Self-pay | Source: Ambulatory Visit | Attending: Cardiology | Admitting: Cardiology

## 2016-05-17 ENCOUNTER — Encounter (HOSPITAL_COMMUNITY)
Admission: RE | Admit: 2016-05-17 | Discharge: 2016-05-17 | Disposition: A | Discharge status: Home / Self Care | Payer: Self-pay | Source: Ambulatory Visit | Attending: Cardiology | Admitting: Cardiology

## 2016-05-17 ENCOUNTER — Other Ambulatory Visit: Payer: Self-pay | Admitting: *Deleted

## 2016-05-17 MED ORDER — LOSARTAN POTASSIUM 25 MG PO TABS: 12.5000 mg | ORAL_TABLET | Freq: Every day | ORAL | 3 refills | Status: DC

## 2016-05-19 ENCOUNTER — Encounter (HOSPITAL_COMMUNITY)
Admission: RE | Admit: 2016-05-19 | Discharge: 2016-05-19 | Disposition: A | Discharge status: Home / Self Care | Payer: Self-pay | Source: Ambulatory Visit | Attending: Cardiology | Admitting: Cardiology

## 2016-05-22 ENCOUNTER — Encounter (HOSPITAL_COMMUNITY)
Admission: RE | Admit: 2016-05-22 | Discharge: 2016-05-22 | Disposition: A | Discharge status: Home / Self Care | Payer: Self-pay | Source: Ambulatory Visit | Attending: Cardiology | Admitting: Cardiology

## 2016-05-24 ENCOUNTER — Encounter (HOSPITAL_COMMUNITY): Payer: Self-pay

## 2016-05-26 ENCOUNTER — Encounter (HOSPITAL_COMMUNITY): Payer: Self-pay

## 2016-05-29 ENCOUNTER — Encounter (HOSPITAL_COMMUNITY)
Admission: RE | Admit: 2016-05-29 | Discharge: 2016-05-29 | Disposition: A | Discharge status: Home / Self Care | Payer: Self-pay | Source: Ambulatory Visit | Attending: Cardiology | Admitting: Cardiology

## 2016-05-31 ENCOUNTER — Encounter (HOSPITAL_COMMUNITY)
Admission: RE | Admit: 2016-05-31 | Discharge: 2016-05-31 | Disposition: A | Discharge status: Home / Self Care | Payer: Self-pay | Source: Ambulatory Visit | Attending: Cardiology | Admitting: Cardiology

## 2016-05-31 DIAGNOSIS — Z951 Presence of aortocoronary bypass graft: Secondary | ICD-10-CM | POA: Insufficient documentation

## 2016-06-02 ENCOUNTER — Encounter (HOSPITAL_COMMUNITY)
Admission: RE | Admit: 2016-06-02 | Discharge: 2016-06-02 | Disposition: A | Discharge status: Home / Self Care | Payer: Self-pay | Source: Ambulatory Visit | Attending: Cardiology | Admitting: Cardiology

## 2016-06-03 DIAGNOSIS — Z951 Presence of aortocoronary bypass graft: Secondary | ICD-10-CM | POA: Insufficient documentation

## 2016-06-05 ENCOUNTER — Encounter (HOSPITAL_COMMUNITY)
Admission: RE | Admit: 2016-06-05 | Discharge: 2016-06-05 | Disposition: A | Discharge status: Home / Self Care | Payer: Self-pay | Source: Ambulatory Visit | Attending: Cardiology | Admitting: Cardiology

## 2016-06-06 ENCOUNTER — Other Ambulatory Visit: Payer: Self-pay | Admitting: Internal Medicine

## 2016-06-06 ENCOUNTER — Ambulatory Visit
Admission: RE | Admit: 2016-06-06 | Discharge: 2016-06-06 | Disposition: A | Discharge status: Home / Self Care | Payer: BLUE CROSS/BLUE SHIELD | Source: Ambulatory Visit | Attending: Internal Medicine | Admitting: Internal Medicine

## 2016-06-06 DIAGNOSIS — M25562 Pain in left knee: Secondary | ICD-10-CM

## 2016-06-06 DIAGNOSIS — F17200 Nicotine dependence, unspecified, uncomplicated: Secondary | ICD-10-CM

## 2016-06-07 ENCOUNTER — Encounter (HOSPITAL_COMMUNITY)
Admission: RE | Admit: 2016-06-07 | Discharge: 2016-06-07 | Disposition: A | Discharge status: Home / Self Care | Payer: Self-pay | Source: Ambulatory Visit | Attending: Cardiology | Admitting: Cardiology

## 2016-06-07 ENCOUNTER — Other Ambulatory Visit: Payer: Self-pay | Admitting: Internal Medicine

## 2016-06-09 ENCOUNTER — Encounter (HOSPITAL_COMMUNITY): Payer: Self-pay

## 2016-06-12 ENCOUNTER — Encounter (HOSPITAL_COMMUNITY)
Admission: RE | Admit: 2016-06-12 | Discharge: 2016-06-12 | Disposition: A | Discharge status: Home / Self Care | Payer: Self-pay | Source: Ambulatory Visit | Attending: Cardiology | Admitting: Cardiology

## 2016-06-13 ENCOUNTER — Ambulatory Visit
Admission: RE | Admit: 2016-06-13 | Discharge: 2016-06-13 | Disposition: A | Discharge status: Home / Self Care | Payer: BLUE CROSS/BLUE SHIELD | Source: Ambulatory Visit | Attending: Internal Medicine | Admitting: Internal Medicine

## 2016-06-13 DIAGNOSIS — F17200 Nicotine dependence, unspecified, uncomplicated: Secondary | ICD-10-CM

## 2016-06-14 ENCOUNTER — Encounter (HOSPITAL_COMMUNITY)
Admission: RE | Admit: 2016-06-14 | Discharge: 2016-06-14 | Disposition: A | Discharge status: Home / Self Care | Payer: Self-pay | Source: Ambulatory Visit | Attending: Cardiology | Admitting: Cardiology

## 2016-06-16 ENCOUNTER — Encounter (HOSPITAL_COMMUNITY)
Admission: RE | Admit: 2016-06-16 | Discharge: 2016-06-16 | Disposition: A | Discharge status: Home / Self Care | Payer: Self-pay | Source: Ambulatory Visit | Attending: Cardiology | Admitting: Cardiology

## 2016-06-19 ENCOUNTER — Encounter (HOSPITAL_COMMUNITY)
Admission: RE | Admit: 2016-06-19 | Discharge: 2016-06-19 | Disposition: A | Discharge status: Home / Self Care | Payer: Self-pay | Source: Ambulatory Visit | Attending: Cardiology | Admitting: Cardiology

## 2016-06-21 ENCOUNTER — Encounter: Payer: BLUE CROSS/BLUE SHIELD | Admitting: Surgery

## 2016-06-21 ENCOUNTER — Encounter (HOSPITAL_COMMUNITY)
Admission: RE | Admit: 2016-06-21 | Discharge: 2016-06-21 | Disposition: A | Discharge status: Home / Self Care | Payer: Self-pay | Source: Ambulatory Visit | Attending: Cardiology | Admitting: Cardiology

## 2016-06-22 ENCOUNTER — Other Ambulatory Visit: Payer: Self-pay | Admitting: *Deleted

## 2016-06-22 ENCOUNTER — Institutional Professional Consult (permissible substitution) (INDEPENDENT_AMBULATORY_CARE_PROVIDER_SITE_OTHER): Payer: BLUE CROSS/BLUE SHIELD | Admitting: Surgery

## 2016-06-22 ENCOUNTER — Encounter: Payer: Self-pay | Admitting: Surgery

## 2016-06-22 VITALS — BP 123/76 | HR 78 | Resp 16 | Ht 71.0 in | Wt 155.0 lb

## 2016-06-22 DIAGNOSIS — Z951 Presence of aortocoronary bypass graft: Secondary | ICD-10-CM

## 2016-06-22 DIAGNOSIS — D381 Neoplasm of uncertain behavior of trachea, bronchus and lung: Secondary | ICD-10-CM | POA: Diagnosis not present

## 2016-06-22 DIAGNOSIS — R911 Solitary pulmonary nodule: Secondary | ICD-10-CM

## 2016-06-22 NOTE — Progress Notes (Signed)
Cardiothoracic Surgery Consultation  PCP is [name], MD Referring Provider is [name], MD  Chief Complaint  Patient presents with  . Lung Lesion    RLLobe...CT CHEST 06/13/16    HPI:  The patient is a 66 year old gentleman with hyperlipidemia, CABG by me in 05/2014, 46 pk-year smoking history but quit at time of CABG who has been followed by Dr. Laurann Montana with a lung cancer screening CT. His most recent CT on 06/13/2016 showed a new 1.1 cm spiculated nodule in the posterior RLL. He has a 9 mm subpleural nodule in the LLL and 9.7 mm subpleural nodule in the RML, both of which have been present since CT in 03/2011. There are additional scattered bilateral pulmonary nodules measuring up to 6.4 mm that are unchanged. He continues to feel well since his CABG in 2016. He goes to cardiac rehab every week and has good stamina. He denies any chest pain or shortness of breath. He has had no cough, sputum or hemoptysis. He denies any other symptoms.   He is here with his wife today who underwent treatment for breast cancer since I operated on him in 2016. 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   Past Medical History:  Diagnosis Date  . Coronary artery disease, occlusive 06/02/2014   Multivessel CAD.mLAD-100%, mRCA 99%, dRCA 100%, mLCX 90% And EF 35-45%.  . Former heavy tobacco smoker    Quit in April 2016   . Hyperlipidemia with target LDL less than 70   . Ischemic cardiomyopathy - resolved 05/2014   Myoview: EF ~33% with "infarction vs. severe resting ischemia in LAD & RCA territory; b) EF by Cath: 35-45%. c) post CABG Echo 9016: EF 50-55%  . 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 CARDIAC CATHETERIZATION N/A 06/02/2014   Procedure: Left Heart Cath and Coronary Angiography;  Surgeon: Leonie Man, MD;  Location: [facility];  Service: Cardiovascular;  mLAD-100%, mRCA 99%, dRCA 100%,  mLCX 90% And EF 35-45%.  . CORONARY ARTERY BYPASS GRAFT N/A 06/04/2014   Procedure: CORONARY ARTERY BYPASS GRAFTING (CABG) times four using bilateral internal mammary arteries and EVH for left  leg saphenous vein;  Surgeon: Gaye Pollack, MD;  Location[facility];  Service: Open Heart Surgery;  LIMA-OM, RIMA-LAD, SVG-Diag, SVG-rPDA  . NM MYOVIEW LTD  05/28/2014   Pre-CABG:  High Risk Nuclear Stress Test with multivessel distribution ischemia. -- Severely ischemic Cardiomyoapthy with evidence of at least 2 vessel disease & EF of ~33%. Images are consistent with either infarction or severe resting ischemia in the LAD & RCA territory.  . TEE WITHOUT CARDIOVERSION N/A 06/04/2014   Procedure: TRANSESOPHAGEAL ECHOCARDIOGRAM (TEE);  Surgeon: Gaye Pollack, MD;  Location: [facility];  Service: Open Heart Surgery;  Laterality: N/A;  . TONSILLECTOMY  1957  . TRANSTHORACIC ECHOCARDIOGRAM  10/2014   Mild concentric LVH. EF 50-55% with mild HK of basal anteroseptal myocardium. GR 1 DD.    Family History  Problem Relation Age of Onset  . Emphysema Mother   . Cardiomyopathy Father   . Healthy Sister   . Heart attack Maternal Grandfather   . Healthy Sister   . Heart disease Paternal Uncle     Social History Social History  Substance Use Topics  . Smoking status: Former Smoker    Packs/day: 0.80    Years: 46.00  Types: Cigarettes    Quit date: 05/29/2014  . Smokeless tobacco: Never Used  . Alcohol use 12.6 - 16.8 oz/week    7 Glasses of wine, 7 Cans of beer, 7 - 14 Standard drinks or equivalent per week    Current Outpatient Prescriptions  Medication Sig Dispense Refill  . aspirin 81 MG tablet Take 81 mg by mouth daily.    Marland Kitchen atorvastatin (LIPITOR) 40 MG tablet ALTERNATE 20 MG ( 1/2 TABLET) AND 40 MG ( 1 TABLET) EVERY OTHER DAY 90 tablet 3  . carvedilol (COREG) 3.125 MG tablet Take 2 tablets (6.25 mg total) by mouth 2 (two) times daily. 360 tablet 1  . losartan (COZAAR) 25 MG tablet Take 0.5 tablets  (12.5 mg total) by mouth daily. 90 tablet 3  . sildenafil (VIAGRA) 50 MG tablet Take 1 tablet (50 mg total) by mouth daily as needed for erectile dysfunction. 10 tablet 5  . Vitamin D, Cholecalciferol, 1000 units CAPS Take 1 capsule by mouth daily.     No current facility-administered medications for this visit.     No Known Allergies  Review of Systems  Constitutional: Negative for activity change, appetite change, fatigue, fever and unexpected weight change.  HENT: Negative.   Eyes: Negative.   Respiratory: Negative for cough, choking, chest tightness, shortness of breath and wheezing.   Cardiovascular: Negative for chest pain, palpitations and leg swelling.  Gastrointestinal: Negative.   Endocrine: Negative.   Genitourinary: Negative.   Musculoskeletal: Positive for arthralgias.  Allergic/Immunologic: Negative.   Neurological: Negative for dizziness and headaches.  Hematological: Negative.   Psychiatric/Behavioral: Negative.     BP 123/76 (BP Location: Left Arm, Patient Position: Sitting, Cuff Size: Large)   Pulse 78   Resp 16   Ht 5\' 11"  (1.803 m)   Wt 155 lb (70.3 kg)   SpO2 95% Comment: ON RA  BMI 21.62 kg/m  Physical Exam  Constitutional: He is oriented to person, place, and time. He appears well-developed and well-nourished. No distress.  HENT:  Head: Normocephalic and atraumatic.  Mouth/Throat: Oropharynx is clear and moist.  Eyes: Conjunctivae and EOM are normal. Pupils are equal, round, and reactive to light.  Neck: Normal range of motion. Neck supple. No JVD present. No thyromegaly present.  Cardiovascular: Normal rate, regular rhythm and intact distal pulses.   No murmur heard. Pulmonary/Chest: Effort normal and breath sounds normal. No respiratory distress. He has no wheezes.  Old sternotomy scar  Abdominal: Soft. Bowel sounds are normal. He exhibits no distension and no mass. There is no tenderness.  Musculoskeletal: Normal range of motion. He exhibits no  edema.  Lymphadenopathy:    He has no cervical adenopathy.  Neurological: He is alert and oriented to person, place, and time. He has normal strength. No cranial nerve deficit or sensory deficit.  Skin: Skin is warm and dry.  Psychiatric: He has a normal mood and affect.     Diagnostic Tests:   Study Result   CLINICAL DATA:  66 year old male former smoker, quit 2 years ago, with 46 pack-year history of smoking, for follow-up lung cancer screening  EXAM: CT CHEST WITHOUT CONTRAST LOW-DOSE FOR LUNG CANCER SCREENING  TECHNIQUE: Multidetector CT imaging of the chest was performed following the standard protocol without IV contrast.  COMPARISON:  Low-dose lung cancer screening CT chest dated 06/08/2015  FINDINGS: Cardiovascular: Heart is normal in size.  No pericardial effusion.  Three vessel coronary atherosclerosis. Postsurgical changes related to prior CABG.  No evidence of thoracic aortic  aneurysm. Atherosclerotic calcifications of the aortic arch.  Mediastinum/Nodes: No suspicious mediastinal lymphadenopathy.  Visualized thyroid is notable for a 10 mm right thyroid nodule (series 2/image 8).  Lungs/Pleura: 11.2 mm spiculated nodule in the posterior right lower lobe (series 3/ image 228), new.  9.0 mm subpleural nodule in the left lower lobe, 9.7 mm subpleural nodule in the right middle lobe, and additional scattered bilateral pulmonary nodules measuring up to 6.4 mm, grossly unchanged.  3.8 mm calcified granuloma in the left upper lobe.  Underlying moderate centrilobular and paraseptal emphysematous changes, upper lobe predominant.  Mild biapical pleural-parenchymal scarring. Mild subpleural reticulation/fibrosis in the bilateral lower lobes.  No focal consolidation.  No pleural effusion or pneumothorax.  Upper Abdomen: Visualized upper abdomen is unremarkable.  Musculoskeletal: Visualized thoracolumbar spine is within  normal limits.  Median sternotomy.  IMPRESSION: 11.2 mm spiculated nodule in the posterior right lower lobe, new.  Lung-RADS 4B, suspicious. Discussion at multidisciplinary tumor board is suggested. Consider percutaneous sampling, PET-CT, or 3 month follow-up CT chest.   Electronically Signed   By: Julian Hy M.D.   On: 06/13/2016 11:40     Impression:  This 66 year old gentleman has a history of heavy prior smoking until 2016 and has been followed with a lung cancer screening CT due to his risk factors. His current scan shows a new 1.1 cm spiculated nodule in the posterior RLL that was not present on his scan a year ago. He has several other nodules that have been stable on CT dating back to 2013. I have personally reviewed and interpreted his current CT and compared it to all of his other prior CT's. This RLL lesion is new and suspicious for lung cancer. I think a PET scan is indicated prior to making any treatment decisions for this suspicious lesion. I will also get PFT's done anticipating the need for surgery. I reviewed the CT images with him and his wife, discussed alternatives for workup and treatment and answered their questions.   Plan:  He will have a PET scan and PFT's and I will see him back in the office afterward.   I spent 30 minutes performing this consultation and > 50% of this time was spent face to face counseling and coordinating the care of this patient's right lower lobe lung nodule.  Gaye Pollack, MD [facility] [phone]

## 2016-06-23 ENCOUNTER — Other Ambulatory Visit (HOSPITAL_COMMUNITY): Payer: Self-pay | Admitting: Respiratory Therapy

## 2016-06-23 ENCOUNTER — Encounter (HOSPITAL_COMMUNITY)
Admission: RE | Admit: 2016-06-23 | Discharge: 2016-06-23 | Disposition: A | Discharge status: Home / Self Care | Payer: Self-pay | Source: Ambulatory Visit | Attending: Cardiology | Admitting: Cardiology

## 2016-06-28 ENCOUNTER — Encounter (HOSPITAL_COMMUNITY)
Admission: RE | Admit: 2016-06-28 | Discharge: 2016-06-28 | Disposition: A | Discharge status: Home / Self Care | Payer: Self-pay | Source: Ambulatory Visit | Attending: Cardiology | Admitting: Cardiology

## 2016-06-30 ENCOUNTER — Encounter (HOSPITAL_COMMUNITY)
Admission: RE | Admit: 2016-06-30 | Discharge: 2016-06-30 | Disposition: A | Discharge status: Home / Self Care | Payer: Self-pay | Source: Ambulatory Visit | Attending: Cardiology | Admitting: Cardiology

## 2016-06-30 DIAGNOSIS — Z951 Presence of aortocoronary bypass graft: Secondary | ICD-10-CM | POA: Insufficient documentation

## 2016-07-03 ENCOUNTER — Encounter (HOSPITAL_COMMUNITY)
Admission: RE | Admit: 2016-07-03 | Discharge: 2016-07-03 | Disposition: A | Discharge status: Home / Self Care | Payer: Self-pay | Source: Ambulatory Visit | Attending: Cardiology | Admitting: Cardiology

## 2016-07-04 ENCOUNTER — Ambulatory Visit (HOSPITAL_COMMUNITY)
Admission: RE | Admit: 2016-07-04 | Discharge: 2016-07-04 | Disposition: A | Discharge status: Home / Self Care | Payer: BLUE CROSS/BLUE SHIELD | Source: Ambulatory Visit | Attending: Surgery | Admitting: Surgery

## 2016-07-04 DIAGNOSIS — K5730 Diverticulosis of large intestine without perforation or abscess without bleeding: Secondary | ICD-10-CM | POA: Diagnosis not present

## 2016-07-04 DIAGNOSIS — Z951 Presence of aortocoronary bypass graft: Secondary | ICD-10-CM | POA: Diagnosis not present

## 2016-07-04 DIAGNOSIS — R911 Solitary pulmonary nodule: Secondary | ICD-10-CM

## 2016-07-04 DIAGNOSIS — J439 Emphysema, unspecified: Secondary | ICD-10-CM | POA: Insufficient documentation

## 2016-07-04 DIAGNOSIS — I2510 Atherosclerotic heart disease of native coronary artery without angina pectoris: Secondary | ICD-10-CM | POA: Insufficient documentation

## 2016-07-04 LAB — PULMONARY FUNCTION TEST
DL/VA % PRED: 46 %
DL/VA: 2.14 ml/min/mmHg/L
DLCO unc % pred: 36 %
DLCO unc: 11.88 ml/min/mmHg
FEF 25-75 POST: 1.1 L/s
FEF 25-75 Pre: 0.78 L/sec
FEF2575-%CHANGE-POST: 42 %
FEF2575-%PRED-POST: 41 %
FEF2575-%PRED-PRE: 29 %
FEV1-%CHANGE-POST: 11 %
FEV1-%PRED-PRE: 60 %
FEV1-%Pred-Post: 67 %
FEV1-PRE: 2.07 L
FEV1-Post: 2.3 L
FEV1FVC-%CHANGE-POST: -2 %
FEV1FVC-%PRED-PRE: 72 %
FEV6-%Change-Post: 7 %
FEV6-%Pred-Post: 89 %
FEV6-%Pred-Pre: 82 %
FEV6-Post: 3.86 L
FEV6-Pre: 3.58 L
FEV6FVC-%Change-Post: -5 %
FEV6FVC-%PRED-POST: 92 %
FEV6FVC-%Pred-Pre: 97 %
FVC-%CHANGE-POST: 13 %
FVC-%PRED-POST: 96 %
FVC-%PRED-PRE: 84 %
FVC-POST: 4.38 L
FVC-PRE: 3.86 L
PRE FEV6/FVC RATIO: 93 %
Post FEV1/FVC ratio: 52 %
Post FEV6/FVC ratio: 88 %
Pre FEV1/FVC ratio: 54 %
RV % pred: 94 %
RV: 2.23 L
TLC % PRED: 91 %
TLC: 6.39 L

## 2016-07-04 LAB — GLUCOSE, CAPILLARY: Glucose-Capillary: 92 mg/dL (ref 65–99)

## 2016-07-04 MED ORDER — ALBUTEROL SULFATE (2.5 MG/3ML) 0.083% IN NEBU
2.5000 mg | INHALATION_SOLUTION | Freq: Once | RESPIRATORY_TRACT | Status: AC
  Administered 2016-07-04: 2.5 mg via RESPIRATORY_TRACT

## 2016-07-04 MED ORDER — FLUDEOXYGLUCOSE F - 18 (FDG) INJECTION
7.6900 | Freq: Once | INTRAVENOUS | Status: AC | PRN
  Administered 2016-07-04: 7.69 via INTRAVENOUS

## 2016-07-05 ENCOUNTER — Encounter (HOSPITAL_COMMUNITY)
Admission: RE | Admit: 2016-07-05 | Discharge: 2016-07-05 | Disposition: A | Discharge status: Home / Self Care | Payer: Self-pay | Source: Ambulatory Visit | Attending: Cardiology | Admitting: Cardiology

## 2016-07-06 ENCOUNTER — Encounter: Payer: Self-pay | Admitting: Surgery

## 2016-07-06 ENCOUNTER — Ambulatory Visit (INDEPENDENT_AMBULATORY_CARE_PROVIDER_SITE_OTHER): Payer: BLUE CROSS/BLUE SHIELD | Admitting: Surgery

## 2016-07-06 ENCOUNTER — Other Ambulatory Visit: Payer: Self-pay | Admitting: *Deleted

## 2016-07-06 VITALS — BP 127/77 | HR 84 | Resp 20 | Ht 71.0 in | Wt 155.0 lb

## 2016-07-06 DIAGNOSIS — R911 Solitary pulmonary nodule: Secondary | ICD-10-CM

## 2016-07-06 DIAGNOSIS — D381 Neoplasm of uncertain behavior of trachea, bronchus and lung: Secondary | ICD-10-CM

## 2016-07-06 DIAGNOSIS — Z951 Presence of aortocoronary bypass graft: Secondary | ICD-10-CM

## 2016-07-07 ENCOUNTER — Other Ambulatory Visit: Payer: Self-pay

## 2016-07-07 ENCOUNTER — Ambulatory Visit (HOSPITAL_COMMUNITY)
Admission: RE | Admit: 2016-07-07 | Discharge: 2016-07-07 | Disposition: A | Discharge status: Home / Self Care | Payer: Medicare Other | Source: Ambulatory Visit | Attending: Surgery | Admitting: Surgery

## 2016-07-07 ENCOUNTER — Encounter (HOSPITAL_COMMUNITY): Payer: Self-pay

## 2016-07-07 ENCOUNTER — Encounter: Payer: Self-pay | Admitting: Surgery

## 2016-07-07 ENCOUNTER — Encounter (HOSPITAL_COMMUNITY)
Admission: RE | Admit: 2016-07-07 | Discharge: 2016-07-07 | Disposition: A | Discharge status: Home / Self Care | Payer: Medicare Other | Source: Ambulatory Visit | Attending: Surgery | Admitting: Surgery

## 2016-07-07 DIAGNOSIS — Z01818 Encounter for other preprocedural examination: Secondary | ICD-10-CM

## 2016-07-07 DIAGNOSIS — I25119 Atherosclerotic heart disease of native coronary artery with unspecified angina pectoris: Secondary | ICD-10-CM | POA: Insufficient documentation

## 2016-07-07 DIAGNOSIS — I2510 Atherosclerotic heart disease of native coronary artery without angina pectoris: Secondary | ICD-10-CM | POA: Diagnosis not present

## 2016-07-07 DIAGNOSIS — R911 Solitary pulmonary nodule: Secondary | ICD-10-CM

## 2016-07-07 DIAGNOSIS — R001 Bradycardia, unspecified: Secondary | ICD-10-CM | POA: Diagnosis not present

## 2016-07-07 DIAGNOSIS — T797XXA Traumatic subcutaneous emphysema, initial encounter: Secondary | ICD-10-CM | POA: Diagnosis not present

## 2016-07-07 DIAGNOSIS — Z79899 Other long term (current) drug therapy: Secondary | ICD-10-CM

## 2016-07-07 DIAGNOSIS — C3431 Malignant neoplasm of lower lobe, right bronchus or lung: Secondary | ICD-10-CM | POA: Diagnosis not present

## 2016-07-07 DIAGNOSIS — I4519 Other right bundle-branch block: Secondary | ICD-10-CM

## 2016-07-07 DIAGNOSIS — Z87891 Personal history of nicotine dependence: Secondary | ICD-10-CM

## 2016-07-07 DIAGNOSIS — I255 Ischemic cardiomyopathy: Secondary | ICD-10-CM | POA: Insufficient documentation

## 2016-07-07 DIAGNOSIS — E785 Hyperlipidemia, unspecified: Secondary | ICD-10-CM | POA: Diagnosis not present

## 2016-07-07 DIAGNOSIS — J449 Chronic obstructive pulmonary disease, unspecified: Secondary | ICD-10-CM | POA: Diagnosis not present

## 2016-07-07 DIAGNOSIS — Z7982 Long term (current) use of aspirin: Secondary | ICD-10-CM | POA: Insufficient documentation

## 2016-07-07 DIAGNOSIS — R110 Nausea: Secondary | ICD-10-CM | POA: Diagnosis not present

## 2016-07-07 DIAGNOSIS — Z825 Family history of asthma and other chronic lower respiratory diseases: Secondary | ICD-10-CM | POA: Diagnosis not present

## 2016-07-07 DIAGNOSIS — R918 Other nonspecific abnormal finding of lung field: Secondary | ICD-10-CM

## 2016-07-07 DIAGNOSIS — J95812 Postprocedural air leak: Secondary | ICD-10-CM | POA: Diagnosis not present

## 2016-07-07 DIAGNOSIS — K5730 Diverticulosis of large intestine without perforation or abscess without bleeding: Secondary | ICD-10-CM | POA: Diagnosis not present

## 2016-07-07 DIAGNOSIS — E041 Nontoxic single thyroid nodule: Secondary | ICD-10-CM | POA: Diagnosis not present

## 2016-07-07 HISTORY — DX: Other nonspecific abnormal finding of lung field: R91.8

## 2016-07-07 LAB — CBC
HCT: 44.9 % (ref 39.0–52.0)
Hemoglobin: 15.3 g/dL (ref 13.0–17.0)
MCH: 32 pg (ref 26.0–34.0)
MCHC: 34.1 g/dL (ref 30.0–36.0)
MCV: 93.9 fL (ref 78.0–100.0)
PLATELETS: 259 10*3/uL (ref 150–400)
RBC: 4.78 MIL/uL (ref 4.22–5.81)
RDW: 13 % (ref 11.5–15.5)
WBC: 10 10*3/uL (ref 4.0–10.5)

## 2016-07-07 LAB — COMPREHENSIVE METABOLIC PANEL
ALBUMIN: 3.9 g/dL (ref 3.5–5.0)
ALT: 22 U/L (ref 17–63)
AST: 24 U/L (ref 15–41)
Alkaline Phosphatase: 76 U/L (ref 38–126)
Anion gap: 10 (ref 5–15)
BUN: 29 mg/dL — AB (ref 6–20)
CHLORIDE: 106 mmol/L (ref 101–111)
CO2: 23 mmol/L (ref 22–32)
Calcium: 9.2 mg/dL (ref 8.9–10.3)
Creatinine, Ser: 1.01 mg/dL (ref 0.61–1.24)
GFR calc Af Amer: >60 mL/min (ref >60)
GFR calc non Af Amer: >60 mL/min (ref >60)
GLUCOSE: 121 mg/dL — AB (ref 65–99)
POTASSIUM: 3.9 mmol/L (ref 3.5–5.1)
Sodium: 139 mmol/L (ref 135–145)
Total Bilirubin: 0.8 mg/dL (ref 0.3–1.2)
Total Protein: 6.6 g/dL (ref 6.5–8.1)

## 2016-07-07 LAB — BLOOD GAS, ARTERIAL
Acid-Base Excess: 2.6 mmol/L — ABNORMAL HIGH (ref 0.0–2.0)
Bicarbonate: 26.2 mmol/L (ref 20.0–28.0)
Drawn by: 421801
O2 SAT: 96.2 %
PATIENT TEMPERATURE: 98.6
pCO2 arterial: 38 mmHg (ref 32.0–48.0)
pH, Arterial: 7.453 — ABNORMAL HIGH (ref 7.350–7.450)
pO2, Arterial: 83.5 mmHg (ref 83.0–108.0)

## 2016-07-07 LAB — APTT: APTT: 31 s (ref 24–36)

## 2016-07-07 LAB — TYPE AND SCREEN
ABO/RH(D): B POS
ANTIBODY SCREEN: NEGATIVE

## 2016-07-07 LAB — PROTIME-INR
INR: 0.97
PROTHROMBIN TIME: 12.8 s (ref 11.4–15.2)

## 2016-07-07 LAB — URINALYSIS, ROUTINE W REFLEX MICROSCOPIC
BILIRUBIN URINE: NEGATIVE
Glucose, UA: NEGATIVE mg/dL
HGB URINE DIPSTICK: NEGATIVE
KETONES UR: NEGATIVE mg/dL
Leukocytes, UA: NEGATIVE
NITRITE: NEGATIVE
PH: 7 (ref 5.0–8.0)
Protein, ur: NEGATIVE mg/dL
Specific Gravity, Urine: 1.023 (ref 1.005–1.030)

## 2016-07-07 LAB — SURGICAL PCR SCREEN
MRSA, PCR: NEGATIVE
Staphylococcus aureus: NEGATIVE

## 2016-07-07 NOTE — Pre-Procedure Instructions (Signed)
Kelly Ida Jr.  07/07/2016      [id number] [place], [place] [address] [place] [address] Phone: [phone] Fax: [phone]  [facility] [place], [place] [address] [place] [address] Phone: [phone] Fax: [phone]    Your procedure is scheduled on June 11  Report to [facility] [place].M.  Call this number if you have problems the morning of surgery:  [phone]   Remember:  Do not eat food or drink liquids after midnight.   Take these medicines the morning of surgery with A SIP OF WATER carvedilol (COREG)   7 days prior to surgery STOP taking any Aleve, Naproxen, Ibuprofen, Motrin, Advil, Goody's, BC's, all herbal medications, fish oil, and all vitamins  Aspirin to be continued just do not take it the morning of surgery    Do not wear jewelry  Do not wear lotions, powders, or colognes, or deoderant.  Men may shave face and neck.  Do not bring valuables to the hospital.  [facility] is not responsible for any belongings or valuables.  Contacts, dentures or bridgework may not be worn into surgery.  Leave your suitcase in the car.  After surgery it may be brought to your room.  For patients admitted to the hospital, discharge time will be determined by your treatment team.  Patients discharged the day of surgery will not be allowed to drive home.    Special instructions:   [facility]- Preparing For Surgery  Before surgery, you can play an important role. Because skin is not sterile, your skin needs to be as free of germs as possible. You can reduce the number of germs on your skin by washing with CHG (chlorahexidine gluconate) Soap before surgery.  CHG is an antiseptic cleaner which kills germs and bonds with the skin to continue killing germs even after washing.  Please do not use if you  have an allergy to CHG or antibacterial soaps. If your skin becomes reddened/irritated stop using the CHG.  Do not shave (including legs and underarms) for at least 48 hours prior to first CHG shower. It is OK to shave your face.  Please follow these instructions carefully.   1. Shower the NIGHT BEFORE SURGERY and the MORNING OF SURGERY with CHG.   2. If you chose to wash your hair, wash your hair first as usual with your normal shampoo.  3. After you shampoo, rinse your hair and body thoroughly to remove the shampoo.  4. Use CHG as you would any other liquid soap. You can apply CHG directly to the skin and wash gently with a scrungie or a clean washcloth.   5. Apply the CHG Soap to your body ONLY FROM THE NECK DOWN.  Do not use on open wounds or open sores. Avoid contact with your eyes, ears, mouth and genitals (private parts). Wash genitals (private parts) with your normal soap.  6. Wash thoroughly, paying special attention to the area where your surgery will be performed.  7. Thoroughly rinse your body with warm water from the neck down.  8. DO NOT shower/wash with your normal soap after using and rinsing off the CHG Soap.  9. Pat yourself dry with a CLEAN TOWEL.   10. Wear CLEAN PAJAMAS   11. Place CLEAN SHEETS on your bed the night of your  first shower and DO NOT SLEEP WITH PETS.    Day of Surgery: Do not apply any deodorants/lotions. Please wear clean clothes to the hospital/surgery center.      Please read over the following fact sheets that you were given.

## 2016-07-07 NOTE — Progress Notes (Signed)
Anesthesia Chart Review: Patient is a 66 year old male scheduled for right VATS, possible thoracotomy for RLL wedge resection, possible RL lobectomy on 07/10/16 (first case). New RLL 11.2 spiculate lung nodule found on 06/13/16 Chest CT (lunc cancer screening).   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 [name].  - Cardiologist Dr. Glenetta Hew, last visit 03/29/16. No CV symptoms. Given okay to jog/run instead of walk for exercise. No new cardiac testing ordered.   Meds include aspirin 81 mg, Lipitor, Coreg, losartan, sildenafil.  BP 131/69   Pulse 74   Temp 36.5 C (Oral)   Resp 20   Ht 5\' 11"  (1.803 m)   Wt 161 lb (73 kg)   SpO2 95%   BMI 22.45 kg/m   EKG 07/07/16: NSR, incomplete right BBB.  Echo 10/29/14: Study Conclusions - Left ventricle: The cavity size was normal. There was mild   concentric hypertrophy. Systolic function was normal. The   estimated ejection fraction was in the range of 50% to 55%. Mild   hypokinesis of the basal anteroseptal myocardium. Doppler   parameters are consistent with abnormal left ventricular   relaxation (grade 1 diastolic dysfunction). - Mitral valve: There was trivial regurgitation. - Right ventricle: The cavity size was normal. Wall thickness was   normal. Systolic function was mildly reduced. (Prevous EF 35-45% by cath 06/02/14.)  Last cath 06/02/14 prior to CABG.  CXR 07/07/16: IMPRESSION: Hyperinflation compatible with COPD/emphysema. No superimposed acute process. 11 mm right lower lobe PET positive nodule and additional lower lobe nodules by CT are not confidently visualized by plain radiography.  PET scan 07/04/16: IMPRESSION: - 10 mm posterior right lower lobe pulmonary nodule shows FDG uptake, suspicious for bronchogenic carcinoma. - No evidence of metastatic disease. - Incidental findings including  moderate emphysema, aortic and coronary artery atherosclerosis, and diffuse colonic diverticulosis.  PFTs 07/04/16: FVC 3.86 (84%), FEV1 2.07 (60%), DLCO unc 11.8 (36%).  Preoperative labs noted. Cr 1.01. CBC, PT/PTT WNL. Glucose 121. UA hazy, but otherwise negative.  He had CABG ~ 2 years ago. Denied any CV symptoms. Recent cardiology follow-up. If no acute changes then I anticipate that he can proceed as planned.  George Hugh [facility] Short Stay Center/Anesthesiology Phone [phone] 07/07/2016 2:40 PM

## 2016-07-07 NOTE — Progress Notes (Signed)
PCP - Tiburcio Pea Cardiologist - [place]  Chest x-ray - 07/07/16 EKG - 07/07/16 Stress Test -05/28/14  ECHO - 10/29/14 Cardiac Cath - 06/02/14   Sending to anesthesia    Patient denies shortness of breath, fever, cough and chest pain at PAT appointment   Patient verbalized understanding of instructions that were given to them at the PAT appointment. Patient was also instructed that they will need to review over the PAT instructions again at home before surgery.

## 2016-07-07 NOTE — Progress Notes (Signed)
HPI:  The patient returns today to discuss the results of his PET scan and PFT's. The RLL peripheral nodule is hypermetabolic consistent with a small bronchogenic carcinoma. There is no other hypermetabolic activity of concern. His PFT's show a moderate obstructive defect and severe diffusion defect consistent with his long prior smoking history and resulting COPD. He is in good physical condition since his CABG in 05/2014. He is physically active, still goes to cardiac rehab and has no chest pain or shortness of breath.   Current Outpatient Prescriptions  Medication Sig Dispense Refill  . aspirin 81 MG tablet Take 81 mg by mouth daily.    Marland Kitchen atorvastatin (LIPITOR) 40 MG tablet ALTERNATE 20 MG ( 1/2 TABLET) AND 40 MG ( 1 TABLET) EVERY OTHER DAY 90 tablet 3  . carvedilol (COREG) 3.125 MG tablet Take 2 tablets (6.25 mg total) by mouth 2 (two) times daily. 360 tablet 1  . losartan (COZAAR) 25 MG tablet Take 0.5 tablets (12.5 mg total) by mouth daily. 90 tablet 3  . sildenafil (VIAGRA) 50 MG tablet Take 1 tablet (50 mg total) by mouth daily as needed for erectile dysfunction. 10 tablet 5  . Vitamin D, Cholecalciferol, 1000 units CAPS Take 1 capsule by mouth daily.     No current facility-administered medications for this visit.      Physical Exam: BP 127/77   Pulse 84   Resp 20   Ht 5\' 11"  (1.803 m)   Wt 155 lb (70.3 kg)   SpO2 96% Comment: RA  BMI 21.62 kg/m  He looks well  Lungs clear Cardiac exam shows a regular rate and rhythm with normal heart sounds  Diagnostic Tests:  CLINICAL DATA:  Initial treatment strategy for right lower lobe pulmonary nodule.  EXAM: NUCLEAR MEDICINE PET SKULL BASE TO THIGH  TECHNIQUE: 7.7 mCi F-18 FDG was injected intravenously. Full-ring PET imaging was performed from the skull base to thigh after the radiotracer. CT data was obtained and used for attenuation correction and anatomic localization.  FASTING BLOOD GLUCOSE:  Value: 92  mg/dl  COMPARISON:  Chest CT on 06/13/2016 and 06/08/2015  FINDINGS: NECK  No hypermetabolic lymph nodes in the neck.  CHEST  No hypermetabolic mediastinal or hilar nodes.  10 mm posterior right lower lobe pulmonary nodule which was new 06/13/2016 CT shows FDG uptake, with SUV max of 3.8. This is suspicious for bronchogenic carcinoma. Other sub-cm bilateral pulmonary nodules remain stable and show no FDG uptake.  Moderate emphysema again noted. Aortic and coronary artery atherosclerosis. Prior CABG again noted.  ABDOMEN/PELVIS  No abnormal hypermetabolic activity within the liver, pancreas, adrenal glands, or spleen. No hypermetabolic lymph nodes in the abdomen or pelvis.  Diffuse colonic diverticulosis noted, however there is no evidence of acute diverticulitis.  SKELETON  No focal hypermetabolic activity to suggest skeletal metastasis.  IMPRESSION: 10 mm posterior right lower lobe pulmonary nodule shows FDG uptake, suspicious for bronchogenic carcinoma.  No evidence of metastatic disease.  Incidental findings including moderate emphysema, aortic and coronary artery atherosclerosis, and diffuse colonic diverticulosis.   Electronically Signed   By: Earle Gell M.D.   On: 07/04/2016 15:16   Pulmonary function test  Order: [id number]  Status:  Edited Result - FINAL Visible to patient:  No (Not Released) Next appt:  Today at 01:00 PM in [facility] ([facility] PAT [facility]) Dx:  Lung nodule    Ref Range & Units 3d ago 73yr ago   FVC-Pre L 3.86  4.03  FVC-%Pred-Pre % 84  86    FVC-Post L 4.38  4.37    FVC-%Pred-Post % 96  94    FVC-%Change-Post % 13  8    FEV1-Pre L 2.07  2.27    FEV1-%Pred-Pre % 60  65    FEV1-Post L 2.30  2.48    FEV1-%Pred-Post % 67  71    FEV1-%Change-Post % 11  9    FEV6-Pre L 3.58  3.78    FEV6-%Pred-Pre % 82  86    FEV6-Post L 3.86  4.07    FEV6-%Pred-Post % 89  92    FEV6-%Change-Post % 7  7    Pre FEV1/FVC ratio % 54   56    FEV1FVC-%Pred-Pre % 72  75    Post FEV1/FVC ratio % 52  57    FEV1FVC-%Change-Post % -2  0    Pre FEV6/FVC Ratio % 93  94    FEV6FVC-%Pred-Pre % 97  98    Post FEV6/FVC ratio % 88  93    FEV6FVC-%Pred-Post % 92  97    FEV6FVC-%Change-Post % -5  0    FEF 25-75 Pre L/sec 0.78  0.86    FEF2575-%Pred-Pre % 29  31    FEF 25-75 Post L/sec 1.10  1.26    FEF2575-%Pred-Post % 41  45    FEF2575-%Change-Post % 42  45    RV L 2.23  1.48    RV % pred % 94  63    TLC L 6.39  6.16    TLC % pred % 91  87    DLCO unc ml/min/mmHg 11.88  18.55    DLCO unc % pred % 36  57    DL/VA ml/min/mmHg/L 2.14  3.20    DL/VA % pred % 46  69   Resulting Agency  [facility]    Specimen Collected: 07/04/16 09:35 Last Resulted: 07/04/16 10:40              Scans on Order [id number]   Scan on 07/05/2016 1:17 PM by Tanda Rockers, MDScan on 07/05/2016 1:17 PM by Tanda Rockers, MD               Impression:  He has a small nodule in the posterior RLL that is 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 His lung function is adequate to tolerate a right lower lobectomy if needed but this lesion is peripheral and should be amenable to a wedge resection. With his severe diffusion capacity defect I think a wedge resection will be a better option for him to preserve as much lung as possible with lower operative risk and better functional capacity. I discussed the operative procedure with him and his wife including alternatives, benefits and risks including but not limited to bleeding, blood transfusion, infection, positive surgical margin requiring lobectomy, persistent air leak, respiratory failure and recurrent cancer. They understand and agree to proceed.  Plan:  Right VATS for wedge resection of the right lower lobe lung nodule, possible thoracotomy and right lower lobectomy if needed.  I spent 10 minutes performing this established patient evaluation and > 50% of this time was spent face to face counseling  and coordinating the care of this patient's aortic aneurysm.    Gaye Pollack, MD [facility] [phone]

## 2016-07-09 NOTE — Anesthesia Preprocedure Evaluation (Signed)
Anesthesia Evaluation  Patient identified by MRN, date of birth, ID band Patient awake    Reviewed: Allergy & Precautions, NPO status , Patient's Chart, lab work & pertinent test results, Unable to perform ROS - Chart review only  Airway Mallampati: II  TM Distance: >3 FB Neck ROM: Full    Dental  (+) Teeth Intact   Pulmonary former smoker,    breath sounds clear to auscultation       Cardiovascular + angina with exertion + CAD   Rhythm:Regular Rate:Normal     Neuro/Psych negative neurological ROS  negative psych ROS   GI/Hepatic negative GI ROS, Neg liver ROS,   Endo/Other  negative endocrine ROS  Renal/GU negative Renal ROS     Musculoskeletal negative musculoskeletal ROS (+)   Abdominal   Peds  Hematology negative hematology ROS (+)   Anesthesia Other Findings   Reproductive/Obstetrics negative OB ROS                             Anesthesia Physical  Anesthesia Plan  ASA: III  Anesthesia Plan: General   Post-op Pain Management:    Induction: Intravenous  PONV Risk Score and Plan: 2 and Ondansetron and Dexamethasone  Airway Management Planned: Oral ETT  Additional Equipment: Arterial line and CVP  Intra-op Plan:   Post-operative Plan: Possible Post-op intubation/ventilation  Informed Consent: I have reviewed the patients History and Physical, chart, labs and discussed the procedure including the risks, benefits and alternatives for the proposed anesthesia with the patient or authorized representative who has indicated his/her understanding and acceptance.   Dental advisory given  Plan Discussed with: CRNA and Anesthesiologist  Anesthesia Plan Comments:         Anesthesia Quick Evaluation

## 2016-07-09 NOTE — H&P (Signed)
Kelly Olson       [place] [address]             [phone]      Cardiothoracic Surgery History and Physical   PCP is Kelly Orn, MD Referring Provider is Kelly Orn, MD      Chief Complaint  Patient presents with  . Lung Lesion    RLLobe...CT CHEST 06/13/16    HPI:  The patient is a 66 year old gentleman with hyperlipidemia, CABG by me in 05/2014, 46 pk-year smoking history but quit at time of CABG who has been followed by Dr. Laurann Montana with a lung cancer screening CT. His most recent CT on 06/13/2016 showed a new 1.1 cm spiculated nodule in the posterior RLL. He has a 9 mm subpleural nodule in the LLL and 9.7 mm subpleural nodule in the RML, both of which have been present since CT in 03/2011. There are additional scattered bilateral pulmonary nodules measuring up to 6.4 mm that are unchanged. He continues to feel well since his CABG in 2016. He goes to cardiac rehab every week and has good stamina. He denies any chest pain or shortness of breath. He has had no cough, sputum or hemoptysis. He denies any other symptoms.   He is here with his wife today who underwent treatment for breast cancer since I operated on him in 2016. 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       Past Medical History:  Diagnosis Date  . Coronary artery disease, occlusive 06/02/2014   Multivessel CAD.mLAD-100%, mRCA 99%, dRCA 100%, mLCX 90% And EF 35-45%.  . Former heavy tobacco smoker    Quit in April 2016   . Hyperlipidemia with target LDL less than 70   . Ischemic cardiomyopathy - resolved 05/2014   Myoview: EF ~33% with "infarction vs. severe resting ischemia in LAD & RCA territory; b) EF by Cath: 35-45%. c) post CABG Echo 9016: EF 50-55%  . 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 CARDIAC CATHETERIZATION N/A 06/02/2014   Procedure: Left Heart Cath and Coronary  Angiography;  Surgeon: Leonie Man, MD;  Location: [facility];  Service: Cardiovascular;  mLAD-100%, mRCA 99%, dRCA 100%, mLCX 90% And EF 35-45%.  . CORONARY ARTERY BYPASS GRAFT N/A 06/04/2014   Procedure: CORONARY ARTERY BYPASS GRAFTING (CABG) times four using bilateral internal mammary arteries and EVH for left  leg saphenous vein;  Surgeon: Gaye Pollack, MD;  Location: [facility];  Service: Open Heart Surgery;  LIMA-OM, RIMA-LAD, SVG-Diag, SVG-rPDA  . NM MYOVIEW LTD  05/28/2014   Pre-CABG:  High Risk Nuclear Stress Test with multivessel distribution ischemia. -- Severely ischemic Cardiomyoapthy with evidence of at least 2 vessel disease & EF of ~33%. Images are consistent with either infarction or severe resting ischemia in the LAD & RCA territory.  . TEE WITHOUT CARDIOVERSION N/A 06/04/2014   Procedure: TRANSESOPHAGEAL ECHOCARDIOGRAM (TEE);  Surgeon: Gaye Pollack, MD;  Location: [facility];  Service: Open Heart Surgery;  Laterality: N/A;  . TONSILLECTOMY  1957  . TRANSTHORACIC ECHOCARDIOGRAM  10/2014   Mild concentric LVH. EF 50-55% with mild HK of basal anteroseptal myocardium. GR 1 DD.         Family History  Problem Relation Age of Onset  . Emphysema Mother   . Cardiomyopathy Father   .  Healthy Sister   . Heart attack Maternal Grandfather   . Healthy Sister   . Heart disease Paternal Uncle     Social History       Social History  Substance Use Topics  . Smoking status: Former Smoker    Packs/day: 0.80    Years: 46.00    Types: Cigarettes    Quit date: 05/29/2014  . Smokeless tobacco: Never Used  . Alcohol use 12.6 - 16.8 oz/week     7 Glasses of wine, 7 Cans of beer, 7 - 14 Standard drinks or equivalent per week           Current Outpatient Prescriptions  Medication Sig Dispense Refill  . aspirin 81 MG tablet Take 81 mg by mouth daily.    Marland Kitchen atorvastatin (LIPITOR) 40 MG tablet ALTERNATE 20 MG ( 1/2 TABLET) AND 40 MG ( 1 TABLET)  EVERY OTHER DAY 90 tablet 3  . carvedilol (COREG) 3.125 MG tablet Take 2 tablets (6.25 mg total) by mouth 2 (two) times daily. 360 tablet 1  . losartan (COZAAR) 25 MG tablet Take 0.5 tablets (12.5 mg total) by mouth daily. 90 tablet 3  . sildenafil (VIAGRA) 50 MG tablet Take 1 tablet (50 mg total) by mouth daily as needed for erectile dysfunction. 10 tablet 5  . Vitamin D, Cholecalciferol, 1000 units CAPS Take 1 capsule by mouth daily.     No current facility-administered medications for this visit.     No Known Allergies  Review of Systems  Constitutional: Negative for activity change, appetite change, fatigue, fever and unexpected weight change.  HENT: Negative.   Eyes: Negative.   Respiratory: Negative for cough, choking, chest tightness, shortness of breath and wheezing.   Cardiovascular: Negative for chest pain, palpitations and leg swelling.  Gastrointestinal: Negative.   Endocrine: Negative.   Genitourinary: Negative.   Musculoskeletal: Positive for arthralgias.  Allergic/Immunologic: Negative.   Neurological: Negative for dizziness and headaches.  Hematological: Negative.   Psychiatric/Behavioral: Negative.     BP 123/76 (BP Location: Left Arm, Patient Position: Sitting, Cuff Size: Large)   Pulse 78   Resp 16   Ht 5\' 11"  (1.803 m)   Wt 155 lb (70.3 kg)   SpO2 95% Comment: ON RA  BMI 21.62 kg/m  Physical Exam  Constitutional: He is oriented to person, place, and time. He appears well-developed and well-nourished. No distress.  HENT:  Head: Normocephalic and atraumatic.  Mouth/Throat: Oropharynx is clear and moist.  Eyes: Conjunctivae and EOM are normal. Pupils are equal, round, and reactive to light.  Neck: Normal range of motion. Neck supple. No JVD present. No thyromegaly present.  Cardiovascular: Normal rate, regular rhythm and intact distal pulses.   No murmur heard. Pulmonary/Chest: Effort normal and breath sounds normal. No respiratory distress. He has  no wheezes.  Old sternotomy scar  Abdominal: Soft. Bowel sounds are normal. He exhibits no distension and no mass. There is no tenderness.  Musculoskeletal: Normal range of motion. He exhibits no edema.  Lymphadenopathy:    He has no cervical adenopathy.  Neurological: He is alert and oriented to person, place, and time. He has normal strength. No cranial nerve deficit or sensory deficit.  Skin: Skin is warm and dry.  Psychiatric: He has a normal mood and affect.     Diagnostic Tests:   Study Result   CLINICAL DATA: 66 year old male former smoker, quit 2 years ago, with 46 pack-year history of smoking, for follow-up lung cancer screening  EXAM: CT  CHEST WITHOUT CONTRAST LOW-DOSE FOR LUNG CANCER SCREENING  TECHNIQUE: Multidetector CT imaging of the chest was performed following the standard protocol without IV contrast.  COMPARISON: Low-dose lung cancer screening CT chest dated 06/08/2015  FINDINGS: Cardiovascular: Heart is normal in size. No pericardial effusion.  Three vessel coronary atherosclerosis. Postsurgical changes related to prior CABG.  No evidence of thoracic aortic aneurysm. Atherosclerotic calcifications of the aortic arch.  Mediastinum/Nodes: No suspicious mediastinal lymphadenopathy.  Visualized thyroid is notable for a 10 mm right thyroid nodule (series 2/image 8).  Lungs/Pleura: 11.2 mm spiculated nodule in the posterior right lower lobe (series 3/ image 228), new.  9.0 mm subpleural nodule in the left lower lobe, 9.7 mm subpleural nodule in the right middle lobe, and additional scattered bilateral pulmonary nodules measuring up to 6.4 mm, grossly unchanged.  3.8 mm calcified granuloma in the left upper lobe.  Underlying moderate centrilobular and paraseptal emphysematous changes, upper lobe predominant.  Mild biapical pleural-parenchymal scarring. Mild subpleural reticulation/fibrosis in the bilateral lower  lobes.  No focal consolidation.  No pleural effusion or pneumothorax.  Upper Abdomen: Visualized upper abdomen is unremarkable.  Musculoskeletal: Visualized thoracolumbar spine is within normal limits.  Median sternotomy.  IMPRESSION: 11.2 mm spiculated nodule in the posterior right lower lobe, new.  Lung-RADS 4B, suspicious. Discussion at multidisciplinary tumor board is suggested. Consider percutaneous sampling, PET-CT, or 3 month follow-up CT chest.   Electronically Signed By: Julian Hy M.D. On: 06/13/2016 11:40   NM PET Image Initial (PI) Skull Base To Thigh (Accession [id number]) (Order [id number])  Imaging  Date: 07/04/2016 Department: [facility] Released By: Hinton Rao D Authorizing: Gaye Pollack, MD  Exam Information   Status Exam Begun  Exam Ended   Final [99] 07/04/2016 11:16 AM 07/04/2016 12:28 PM  PACS Images   Show images for NM PET Image Initial (PI) Skull Base To Thigh  Study Result   CLINICAL DATA:  Initial treatment strategy for right lower lobe pulmonary nodule.  EXAM: NUCLEAR MEDICINE PET SKULL BASE TO THIGH  TECHNIQUE: 7.7 mCi F-18 FDG was injected intravenously. Full-ring PET imaging was performed from the skull base to thigh after the radiotracer. CT data was obtained and used for attenuation correction and anatomic localization.  FASTING BLOOD GLUCOSE:  Value: 92 mg/dl  COMPARISON:  Chest CT on 06/13/2016 and 06/08/2015  FINDINGS: NECK  No hypermetabolic lymph nodes in the neck.  CHEST  No hypermetabolic mediastinal or hilar nodes.  10 mm posterior right lower lobe pulmonary nodule which was new 06/13/2016 CT shows FDG uptake, with SUV max of 3.8. This is suspicious for bronchogenic carcinoma. Other sub-cm bilateral pulmonary nodules remain stable and show no FDG uptake.  Moderate emphysema again noted. Aortic and coronary artery atherosclerosis. Prior  CABG again noted.  ABDOMEN/PELVIS  No abnormal hypermetabolic activity within the liver, pancreas, adrenal glands, or spleen. No hypermetabolic lymph nodes in the abdomen or pelvis.  Diffuse colonic diverticulosis noted, however there is no evidence of acute diverticulitis.  SKELETON  No focal hypermetabolic activity to suggest skeletal metastasis.  IMPRESSION: 10 mm posterior right lower lobe pulmonary nodule shows FDG uptake, suspicious for bronchogenic carcinoma.  No evidence of metastatic disease.  Incidental findings including moderate emphysema, aortic and coronary artery atherosclerosis, and diffuse colonic diverticulosis.   Electronically Signed   By: Earle Gell M.D.   On: 07/04/2016 15:16     Pulmonary function test  Order: [id number]  Status:  Edited Result - FINAL Visible to patient:  No (Not Released) Next appt:  Today at 01:00 PM in [facility] ([facility]) Dx:  Lung nodule    Ref Range & Units 3d ago 61yr ago   FVC-Pre L 3.86  4.03    FVC-%Pred-Pre % 84  86    FVC-Post L 4.38  4.37    FVC-%Pred-Post % 96  94    FVC-%Change-Post % 13  8    FEV1-Pre L 2.07  2.27    FEV1-%Pred-Pre % 60  65    FEV1-Post L 2.30  2.48    FEV1-%Pred-Post % 67  71    FEV1-%Change-Post % 11  9    FEV6-Pre L 3.58  3.78    FEV6-%Pred-Pre % 82  86    FEV6-Post L 3.86  4.07    FEV6-%Pred-Post % 89  92    FEV6-%Change-Post % 7  7    Pre FEV1/FVC ratio % 54  56    FEV1FVC-%Pred-Pre % 72  75    Post FEV1/FVC ratio % 52  57    FEV1FVC-%Change-Post % -2  0    Pre FEV6/FVC Ratio % 93  94    FEV6FVC-%Pred-Pre % 97  98    Post FEV6/FVC ratio % 88  93    FEV6FVC-%Pred-Post % 92  97    FEV6FVC-%Change-Post % -5  0    FEF 25-75 Pre L/sec 0.78  0.86    FEF2575-%Pred-Pre % 29  31    FEF 25-75 Post L/sec 1.10  1.26    FEF2575-%Pred-Post % 41  45    FEF2575-%Change-Post % 42  45    RV L 2.23  1.48    RV % pred % 94  63    TLC L 6.39  6.16    TLC % pred % 91  87     DLCO unc ml/min/mmHg 11.88  18.55    DLCO unc % pred % 36  57    DL/VA ml/min/mmHg/L 2.14  3.20    DL/VA % pred % 46  69   Resulting Agency  [facility]    Specimen Collected: 07/04/16 09:35 Last Resulted: 07/04/16 10:40              Scans on Order [id number]   Scan on 07/05/2016 1:17 PM by Tanda Rockers, MDScan on 07/05/2016 1:17 PM by Tanda Rockers, MD               Impression:  This 66 year old gentleman has a history of heavy prior smoking until 2016 and has been followed with a lung cancer screening CT due to his risk factors. His current scan shows a new 1.1 cm spiculated hypermetabolic nodule in the posterior RLL that was not present on his scan a year ago. He has several other nodules that have been stable on CT dating back to 2013. I have personally reviewed and interpreted his current CT and PET scan and compared it to all of his other prior CT's. This RLL lesion is new and suspicious for lung cancer. His lung function is adequate to tolerate a right lower lobectomy if needed but this lesion is peripheral and should be amenable to a wedge resection. With his severe diffusion capacity defect I think a wedge resection will be a better option for him to preserve as much lung as possible with lower operative risk and better functional capacity. I discussed the operative procedure with him and his wife including alternatives, benefits and risks including but not limited  to bleeding, blood transfusion, infection, positive surgical margin requiring lobectomy, persistent air leak, respiratory failure and recurrent cancer. They understand and agree to proceed.  Plan:  Right VATS for wedge resection of the right lower lobe lung nodule, possible thoracotomy and right lower lobectomy if needed.   Gaye Pollack, MD [facility] [phone]

## 2016-07-10 ENCOUNTER — Encounter (HOSPITAL_COMMUNITY)
Admission: RE | Disposition: A | Discharge status: Home / Self Care | Payer: Self-pay | Source: Ambulatory Visit | Attending: Surgery

## 2016-07-10 ENCOUNTER — Inpatient Hospital Stay (HOSPITAL_COMMUNITY): Payer: Medicare Other | Admitting: Certified Registered Nurse Anesthetist

## 2016-07-10 ENCOUNTER — Encounter (HOSPITAL_COMMUNITY): Payer: Self-pay | Admitting: Certified Registered Nurse Anesthetist

## 2016-07-10 ENCOUNTER — Inpatient Hospital Stay (HOSPITAL_COMMUNITY): Payer: Medicare Other | Admitting: Vascular Surgery

## 2016-07-10 ENCOUNTER — Inpatient Hospital Stay (HOSPITAL_COMMUNITY)
Admission: RE | Admit: 2016-07-10 | Discharge: 2016-07-26 | DRG: 164 | Disposition: A | Discharge status: Home / Self Care | Payer: Medicare Other | Source: Ambulatory Visit | Attending: Surgery | Admitting: Surgery

## 2016-07-10 ENCOUNTER — Inpatient Hospital Stay (HOSPITAL_COMMUNITY): Payer: Medicare Other

## 2016-07-10 ENCOUNTER — Encounter (HOSPITAL_COMMUNITY): Admission: RE | Admit: 2016-07-10 | Payer: Self-pay | Source: Ambulatory Visit

## 2016-07-10 DIAGNOSIS — Z825 Family history of asthma and other chronic lower respiratory diseases: Secondary | ICD-10-CM | POA: Diagnosis not present

## 2016-07-10 DIAGNOSIS — J939 Pneumothorax, unspecified: Secondary | ICD-10-CM

## 2016-07-10 DIAGNOSIS — T8182XS Emphysema (subcutaneous) resulting from a procedure, sequela: Secondary | ICD-10-CM

## 2016-07-10 DIAGNOSIS — Z7982 Long term (current) use of aspirin: Secondary | ICD-10-CM | POA: Diagnosis not present

## 2016-07-10 DIAGNOSIS — K5730 Diverticulosis of large intestine without perforation or abscess without bleeding: Secondary | ICD-10-CM | POA: Diagnosis not present

## 2016-07-10 DIAGNOSIS — R911 Solitary pulmonary nodule: Secondary | ICD-10-CM

## 2016-07-10 DIAGNOSIS — J95811 Postprocedural pneumothorax: Secondary | ICD-10-CM | POA: Diagnosis not present

## 2016-07-10 DIAGNOSIS — E785 Hyperlipidemia, unspecified: Secondary | ICD-10-CM | POA: Diagnosis present

## 2016-07-10 DIAGNOSIS — R001 Bradycardia, unspecified: Secondary | ICD-10-CM | POA: Diagnosis not present

## 2016-07-10 DIAGNOSIS — Z09 Encounter for follow-up examination after completed treatment for conditions other than malignant neoplasm: Secondary | ICD-10-CM

## 2016-07-10 DIAGNOSIS — Z87891 Personal history of nicotine dependence: Secondary | ICD-10-CM

## 2016-07-10 DIAGNOSIS — Z951 Presence of aortocoronary bypass graft: Secondary | ICD-10-CM | POA: Diagnosis not present

## 2016-07-10 DIAGNOSIS — I255 Ischemic cardiomyopathy: Secondary | ICD-10-CM | POA: Diagnosis not present

## 2016-07-10 DIAGNOSIS — E041 Nontoxic single thyroid nodule: Secondary | ICD-10-CM | POA: Diagnosis not present

## 2016-07-10 DIAGNOSIS — J95812 Postprocedural air leak: Secondary | ICD-10-CM | POA: Diagnosis not present

## 2016-07-10 DIAGNOSIS — J9383 Other pneumothorax: Secondary | ICD-10-CM | POA: Diagnosis not present

## 2016-07-10 DIAGNOSIS — J449 Chronic obstructive pulmonary disease, unspecified: Secondary | ICD-10-CM | POA: Diagnosis present

## 2016-07-10 DIAGNOSIS — R110 Nausea: Secondary | ICD-10-CM | POA: Diagnosis not present

## 2016-07-10 DIAGNOSIS — Z4682 Encounter for fitting and adjustment of non-vascular catheter: Secondary | ICD-10-CM

## 2016-07-10 DIAGNOSIS — Z9889 Other specified postprocedural states: Secondary | ICD-10-CM

## 2016-07-10 DIAGNOSIS — Z8249 Family history of ischemic heart disease and other diseases of the circulatory system: Secondary | ICD-10-CM

## 2016-07-10 DIAGNOSIS — I2510 Atherosclerotic heart disease of native coronary artery without angina pectoris: Secondary | ICD-10-CM | POA: Diagnosis not present

## 2016-07-10 DIAGNOSIS — Z79899 Other long term (current) drug therapy: Secondary | ICD-10-CM | POA: Diagnosis not present

## 2016-07-10 DIAGNOSIS — Y838 Other surgical procedures as the cause of abnormal reaction of the patient, or of later complication, without mention of misadventure at the time of the procedure: Secondary | ICD-10-CM | POA: Diagnosis not present

## 2016-07-10 DIAGNOSIS — Z9689 Presence of other specified functional implants: Secondary | ICD-10-CM

## 2016-07-10 DIAGNOSIS — C3431 Malignant neoplasm of lower lobe, right bronchus or lung: Secondary | ICD-10-CM | POA: Diagnosis not present

## 2016-07-10 DIAGNOSIS — T797XXA Traumatic subcutaneous emphysema, initial encounter: Secondary | ICD-10-CM | POA: Diagnosis not present

## 2016-07-10 HISTORY — PX: VIDEO ASSISTED THORACOSCOPY (VATS)/WEDGE RESECTION: SHX6174

## 2016-07-10 HISTORY — PX: THORACOTOMY: SHX5074

## 2016-07-10 LAB — CBC
HCT: 42.8 % (ref 39.0–52.0)
Hemoglobin: 14.2 g/dL (ref 13.0–17.0)
MCH: 31.4 pg (ref 26.0–34.0)
MCHC: 33.2 g/dL (ref 30.0–36.0)
MCV: 94.7 fL (ref 78.0–100.0)
PLATELETS: 238 10*3/uL (ref 150–400)
RBC: 4.52 MIL/uL (ref 4.22–5.81)
RDW: 12.8 % (ref 11.5–15.5)
WBC: 14.7 10*3/uL — ABNORMAL HIGH (ref 4.0–10.5)

## 2016-07-10 LAB — GLUCOSE, CAPILLARY
Glucose-Capillary: 148 mg/dL — ABNORMAL HIGH (ref 65–99)
Glucose-Capillary: 156 mg/dL — ABNORMAL HIGH (ref 65–99)
Glucose-Capillary: 213 mg/dL — ABNORMAL HIGH (ref 65–99)

## 2016-07-10 SURGERY — VIDEO ASSISTED THORACOSCOPY (VATS)/WEDGE RESECTION
Anesthesia: General | Site: Chest | Laterality: Right

## 2016-07-10 MED ORDER — ONDANSETRON HCL 4 MG/2ML IJ SOLN: 4.0000 mg | Freq: Four times a day (QID) | INTRAMUSCULAR | Status: DC | PRN

## 2016-07-10 MED ORDER — EPHEDRINE SULFATE-NACL 50-0.9 MG/10ML-% IV SOSY
PREFILLED_SYRINGE | INTRAVENOUS | Status: DC | PRN
  Administered 2016-07-10: 10 mg via INTRAVENOUS
  Administered 2016-07-10: 5 mg via INTRAVENOUS
  Administered 2016-07-10: 15 mg via INTRAVENOUS
  Administered 2016-07-10: 5 mg via INTRAVENOUS

## 2016-07-10 MED ORDER — DEXAMETHASONE SODIUM PHOSPHATE 10 MG/ML IJ SOLN
INTRAMUSCULAR | Status: AC
  Filled 2016-07-10: qty 1

## 2016-07-10 MED ORDER — BISACODYL 5 MG PO TBEC
10.0000 mg | DELAYED_RELEASE_TABLET | Freq: Every day | ORAL | Status: DC
  Administered 2016-07-10 – 2016-07-12 (×3): 10 mg via ORAL
  Filled 2016-07-10 (×3): qty 2

## 2016-07-10 MED ORDER — 0.9 % SODIUM CHLORIDE (POUR BTL) OPTIME
TOPICAL | Status: DC | PRN
  Administered 2016-07-10: 3000 mL

## 2016-07-10 MED ORDER — GLYCOPYRROLATE 0.2 MG/ML IJ SOLN
INTRAMUSCULAR | Status: DC | PRN
  Administered 2016-07-10: 0.6 mg via INTRAVENOUS

## 2016-07-10 MED ORDER — SENNOSIDES-DOCUSATE SODIUM 8.6-50 MG PO TABS
1.0000 | ORAL_TABLET | Freq: Every day | ORAL | Status: DC
  Administered 2016-07-10 – 2016-07-11 (×2): 1 via ORAL
  Filled 2016-07-10 (×2): qty 1

## 2016-07-10 MED ORDER — PHENYLEPHRINE 40 MCG/ML (10ML) SYRINGE FOR IV PUSH (FOR BLOOD PRESSURE SUPPORT)
PREFILLED_SYRINGE | INTRAVENOUS | Status: AC
  Filled 2016-07-10: qty 10

## 2016-07-10 MED ORDER — ENOXAPARIN SODIUM 40 MG/0.4ML ~~LOC~~ SOLN
40.0000 mg | Freq: Every day | SUBCUTANEOUS | Status: DC
  Administered 2016-07-11 – 2016-07-26 (×16): 40 mg via SUBCUTANEOUS
  Filled 2016-07-10 (×17): qty 0.4

## 2016-07-10 MED ORDER — ACETAMINOPHEN 160 MG/5ML PO SOLN
1000.0000 mg | Freq: Four times a day (QID) | ORAL | Status: AC
Start: 2016-07-10 — End: 2016-07-15

## 2016-07-10 MED ORDER — EPHEDRINE 5 MG/ML INJ
INTRAVENOUS | Status: AC
  Filled 2016-07-10: qty 10

## 2016-07-10 MED ORDER — DEXTROSE 5 % IV SOLN
1.5000 g | Freq: Two times a day (BID) | INTRAVENOUS | Status: AC
  Administered 2016-07-10 – 2016-07-11 (×2): 1.5 g via INTRAVENOUS
  Filled 2016-07-10 (×2): qty 1.5

## 2016-07-10 MED ORDER — PROPOFOL 10 MG/ML IV BOLUS
INTRAVENOUS | Status: AC
  Filled 2016-07-10: qty 20

## 2016-07-10 MED ORDER — LIDOCAINE HCL (CARDIAC) 20 MG/ML IV SOLN
INTRAVENOUS | Status: DC | PRN
Start: 2016-07-10 — End: 2016-07-10
  Administered 2016-07-10: 100 mg via INTRAVENOUS

## 2016-07-10 MED ORDER — MIDAZOLAM HCL 5 MG/5ML IJ SOLN
INTRAMUSCULAR | Status: DC | PRN
  Administered 2016-07-10 (×2): 1 mg via INTRAVENOUS

## 2016-07-10 MED ORDER — SODIUM CHLORIDE 0.9% FLUSH: 9.0000 mL | INTRAVENOUS | Status: DC | PRN

## 2016-07-10 MED ORDER — ATORVASTATIN CALCIUM 40 MG PO TABS
40.0000 mg | ORAL_TABLET | Freq: Every day | ORAL | Status: DC
  Administered 2016-07-10 – 2016-07-25 (×16): 40 mg via ORAL
  Filled 2016-07-10 (×16): qty 1

## 2016-07-10 MED ORDER — NEOSTIGMINE METHYLSULFATE 10 MG/10ML IV SOLN
INTRAVENOUS | Status: DC | PRN
  Administered 2016-07-10: 4 mg via INTRAVENOUS

## 2016-07-10 MED ORDER — FENTANYL CITRATE (PF) 250 MCG/5ML IJ SOLN
INTRAMUSCULAR | Status: AC
  Filled 2016-07-10: qty 5

## 2016-07-10 MED ORDER — MIDAZOLAM HCL 2 MG/2ML IJ SOLN
INTRAMUSCULAR | Status: AC
  Filled 2016-07-10: qty 2

## 2016-07-10 MED ORDER — PHENYLEPHRINE 40 MCG/ML (10ML) SYRINGE FOR IV PUSH (FOR BLOOD PRESSURE SUPPORT)
PREFILLED_SYRINGE | INTRAVENOUS | Status: DC | PRN
  Administered 2016-07-10: 40 ug via INTRAVENOUS

## 2016-07-10 MED ORDER — ORAL CARE MOUTH RINSE
15.0000 mL | Freq: Two times a day (BID) | OROMUCOSAL | Status: DC
  Administered 2016-07-10 (×2): 15 mL via OROMUCOSAL

## 2016-07-10 MED ORDER — LACTATED RINGERS IV SOLN
INTRAVENOUS | Status: DC | PRN
  Administered 2016-07-10: 07:00:00 via INTRAVENOUS

## 2016-07-10 MED ORDER — ONDANSETRON HCL 4 MG/2ML IJ SOLN
INTRAMUSCULAR | Status: DC | PRN
  Administered 2016-07-10: 4 mg via INTRAVENOUS

## 2016-07-10 MED ORDER — DIPHENHYDRAMINE HCL 12.5 MG/5ML PO ELIX: 12.5000 mg | ORAL_SOLUTION | Freq: Four times a day (QID) | ORAL | Status: DC | PRN

## 2016-07-10 MED ORDER — FENTANYL 40 MCG/ML IV SOLN
INTRAVENOUS | Status: DC
  Administered 2016-07-10: 1000 ug via INTRAVENOUS
  Administered 2016-07-10: 0 ug via INTRAVENOUS
  Administered 2016-07-10: 30 ug via INTRAVENOUS
  Administered 2016-07-11 (×2): 0 ug via INTRAVENOUS
  Filled 2016-07-10 (×2): qty 25

## 2016-07-10 MED ORDER — FENTANYL CITRATE (PF) 250 MCG/5ML IJ SOLN
INTRAMUSCULAR | Status: DC | PRN
  Administered 2016-07-10: 50 ug via INTRAVENOUS
  Administered 2016-07-10 (×2): 100 ug via INTRAVENOUS
  Administered 2016-07-10: 25 ug via INTRAVENOUS
  Administered 2016-07-10: 50 ug via INTRAVENOUS
  Administered 2016-07-10: 25 ug via INTRAVENOUS
  Administered 2016-07-10: 50 ug via INTRAVENOUS
  Administered 2016-07-10 (×2): 25 ug via INTRAVENOUS

## 2016-07-10 MED ORDER — DIPHENHYDRAMINE HCL 50 MG/ML IJ SOLN: 12.5000 mg | Freq: Four times a day (QID) | INTRAMUSCULAR | Status: DC | PRN

## 2016-07-10 MED ORDER — HEMOSTATIC AGENTS (NO CHARGE) OPTIME
TOPICAL | Status: DC | PRN
  Administered 2016-07-10: 1 via TOPICAL

## 2016-07-10 MED ORDER — PROMETHAZINE HCL 25 MG/ML IJ SOLN: 6.2500 mg | INTRAMUSCULAR | Status: DC | PRN

## 2016-07-10 MED ORDER — TRAMADOL HCL 50 MG PO TABS
50.0000 mg | ORAL_TABLET | Freq: Four times a day (QID) | ORAL | Status: DC | PRN
  Administered 2016-07-13 – 2016-07-24 (×22): 100 mg via ORAL
  Filled 2016-07-10 (×23): qty 2

## 2016-07-10 MED ORDER — PHENYLEPHRINE HCL 10 MG/ML IJ SOLN
INTRAVENOUS | Status: DC | PRN
  Administered 2016-07-10: 25 ug/min via INTRAVENOUS

## 2016-07-10 MED ORDER — MEPERIDINE HCL 25 MG/ML IJ SOLN: 6.2500 mg | INTRAMUSCULAR | Status: DC | PRN

## 2016-07-10 MED ORDER — DEXTROSE 5 % IV SOLN
1.5000 g | INTRAVENOUS | Status: AC
  Administered 2016-07-10: 1.5 g via INTRAVENOUS
  Filled 2016-07-10: qty 1.5

## 2016-07-10 MED ORDER — NALOXONE HCL 0.4 MG/ML IJ SOLN: 0.4000 mg | INTRAMUSCULAR | Status: DC | PRN

## 2016-07-10 MED ORDER — DEXAMETHASONE SODIUM PHOSPHATE 10 MG/ML IJ SOLN
INTRAMUSCULAR | Status: DC | PRN
  Administered 2016-07-10: 10 mg via INTRAVENOUS

## 2016-07-10 MED ORDER — FENTANYL CITRATE (PF) 250 MCG/5ML IJ SOLN
INTRAMUSCULAR | Status: AC
Start: 2016-07-10 — End: 2016-07-10
  Filled 2016-07-10: qty 5

## 2016-07-10 MED ORDER — PHENOL 1.4 % MT LIQD
1.0000 | OROMUCOSAL | Status: DC | PRN
  Administered 2016-07-10 – 2016-07-13 (×2): 1 via OROMUCOSAL
  Filled 2016-07-10: qty 177

## 2016-07-10 MED ORDER — VECURONIUM BROMIDE 10 MG IV SOLR
INTRAVENOUS | Status: DC | PRN
  Administered 2016-07-10: 1 mg via INTRAVENOUS

## 2016-07-10 MED ORDER — PROPOFOL 10 MG/ML IV BOLUS
INTRAVENOUS | Status: DC | PRN
  Administered 2016-07-10 (×2): 10 mg via INTRAVENOUS
  Administered 2016-07-10 (×2): 30 mg via INTRAVENOUS
  Administered 2016-07-10: 140 mg via INTRAVENOUS

## 2016-07-10 MED ORDER — POTASSIUM CHLORIDE 10 MEQ/50ML IV SOLN
10.0000 meq | Freq: Every day | INTRAVENOUS | Status: DC | PRN
  Filled 2016-07-10: qty 50

## 2016-07-10 MED ORDER — VECURONIUM BROMIDE 10 MG IV SOLR
INTRAVENOUS | Status: AC
  Filled 2016-07-10: qty 10

## 2016-07-10 MED ORDER — FENTANYL CITRATE (PF) 100 MCG/2ML IJ SOLN: 25.0000 ug | INTRAMUSCULAR | Status: DC | PRN

## 2016-07-10 MED ORDER — INSULIN ASPART 100 UNIT/ML ~~LOC~~ SOLN
0.0000 [IU] | SUBCUTANEOUS | Status: DC
  Administered 2016-07-10: 2 [IU] via SUBCUTANEOUS
  Administered 2016-07-10: 8 [IU] via SUBCUTANEOUS
  Administered 2016-07-10 – 2016-07-11 (×2): 2 [IU] via SUBCUTANEOUS

## 2016-07-10 MED ORDER — MENTHOL 3 MG MT LOZG
1.0000 | LOZENGE | OROMUCOSAL | Status: DC | PRN
  Filled 2016-07-10 (×2): qty 9

## 2016-07-10 MED ORDER — ACETAMINOPHEN 500 MG PO TABS
1000.0000 mg | ORAL_TABLET | Freq: Four times a day (QID) | ORAL | Status: AC
  Administered 2016-07-10 – 2016-07-15 (×18): 1000 mg via ORAL
  Filled 2016-07-10 (×19): qty 2

## 2016-07-10 MED ORDER — ONDANSETRON HCL 4 MG/2ML IJ SOLN
INTRAMUSCULAR | Status: AC
  Filled 2016-07-10: qty 2

## 2016-07-10 MED ORDER — HYDROMORPHONE HCL 1 MG/ML IJ SOLN: 0.2500 mg | INTRAMUSCULAR | Status: DC | PRN

## 2016-07-10 MED ORDER — DEXTROSE-NACL 5-0.45 % IV SOLN
INTRAVENOUS | Status: DC
  Administered 2016-07-10: 14:00:00 via INTRAVENOUS
  Filled 2016-07-10 (×2): qty 1000

## 2016-07-10 MED ORDER — ROCURONIUM BROMIDE 10 MG/ML (PF) SYRINGE
PREFILLED_SYRINGE | INTRAVENOUS | Status: AC
  Filled 2016-07-10: qty 5

## 2016-07-10 MED ORDER — LIDOCAINE 2% (20 MG/ML) 5 ML SYRINGE
INTRAMUSCULAR | Status: AC
  Filled 2016-07-10: qty 5

## 2016-07-10 MED ORDER — OXYCODONE HCL 5 MG PO TABS
5.0000 mg | ORAL_TABLET | ORAL | Status: DC | PRN
  Administered 2016-07-11 – 2016-07-17 (×22): 10 mg via ORAL
  Administered 2016-07-17: 5 mg via ORAL
  Administered 2016-07-18 – 2016-07-25 (×18): 10 mg via ORAL
  Filled 2016-07-10 (×44): qty 2

## 2016-07-10 MED ORDER — ROCURONIUM BROMIDE 100 MG/10ML IV SOLN
INTRAVENOUS | Status: DC | PRN
Start: 2016-07-10 — End: 2016-07-10
  Administered 2016-07-10: 10 mg via INTRAVENOUS
  Administered 2016-07-10: 60 mg via INTRAVENOUS
  Administered 2016-07-10 (×3): 10 mg via INTRAVENOUS

## 2016-07-10 SURGICAL SUPPLY — 82 items
ADH SKN CLS APL DERMABOND .7 (GAUZE/BANDAGES/DRESSINGS) ×1
APL SKNCLS STERI-STRIP NONHPOA (GAUZE/BANDAGES/DRESSINGS)
APL SRG 22X2 LUM MLBL SLNT (VASCULAR PRODUCTS)
APL SRG 7X2 LUM MLBL SLNT (VASCULAR PRODUCTS)
APPLICATOR TIP COSEAL (VASCULAR PRODUCTS) IMPLANT
APPLICATOR TIP EXT COSEAL (VASCULAR PRODUCTS) IMPLANT
BENZOIN TINCTURE PRP APPL 2/3 (GAUZE/BANDAGES/DRESSINGS) IMPLANT
CANISTER SUCT 3000ML PPV (MISCELLANEOUS) ×6 IMPLANT
CATH KIT ON Q 5IN SLV (PAIN MANAGEMENT) IMPLANT
CATH THORACIC 28FR (CATHETERS) ×2 IMPLANT
CATH THORACIC 36FR (CATHETERS) IMPLANT
CATH THORACIC 36FR RT ANG (CATHETERS) IMPLANT
CLIP TI MEDIUM 6 (CLIP) ×3 IMPLANT
CONN ST 1/4X3/8  BEN (MISCELLANEOUS)
CONN ST 1/4X3/8 BEN (MISCELLANEOUS) IMPLANT
CONN Y 3/8X3/8X3/8  BEN (MISCELLANEOUS)
CONN Y 3/8X3/8X3/8 BEN (MISCELLANEOUS) IMPLANT
CONT SPEC 4OZ CLIKSEAL STRL BL (MISCELLANEOUS) ×6 IMPLANT
COVER SURGICAL LIGHT HANDLE (MISCELLANEOUS) ×6 IMPLANT
DERMABOND ADVANCED (GAUZE/BANDAGES/DRESSINGS) ×2
DERMABOND ADVANCED .7 DNX12 (GAUZE/BANDAGES/DRESSINGS) IMPLANT
DRAIN CHANNEL 28F RND 3/8 FF (WOUND CARE) IMPLANT
DRAIN CHANNEL 32F RND 10.7 FF (WOUND CARE) IMPLANT
DRAPE LAPAROSCOPIC ABDOMINAL (DRAPES) ×3 IMPLANT
DRAPE WARM FLUID 44X44 (DRAPE) ×3 IMPLANT
DRILL BIT 7/64X5 (BIT) IMPLANT
ELECT REM PT RETURN 9FT ADLT (ELECTROSURGICAL) ×3
ELECTRODE REM PT RTRN 9FT ADLT (ELECTROSURGICAL) ×1 IMPLANT
GAUZE SPONGE 4X4 12PLY STRL (GAUZE/BANDAGES/DRESSINGS) ×3 IMPLANT
GLOVE EUDERMIC 7 POWDERFREE (GLOVE) ×6 IMPLANT
GOWN STRL REUS W/ TWL LRG LVL3 (GOWN DISPOSABLE) ×4 IMPLANT
GOWN STRL REUS W/ TWL XL LVL3 (GOWN DISPOSABLE) ×1 IMPLANT
GOWN STRL REUS W/TWL LRG LVL3 (GOWN DISPOSABLE) ×12
GOWN STRL REUS W/TWL XL LVL3 (GOWN DISPOSABLE) ×3
HANDLE STAPLE ENDO GIA SHORT (STAPLE) ×2
KIT BASIN OR (CUSTOM PROCEDURE TRAY) ×3 IMPLANT
KIT ROOM TURNOVER OR (KITS) ×3 IMPLANT
KIT SUCTION CATH 14FR (SUCTIONS) ×3 IMPLANT
NS IRRIG 1000ML POUR BTL (IV SOLUTION) ×12 IMPLANT
PACK CHEST (CUSTOM PROCEDURE TRAY) ×3 IMPLANT
PAD ARMBOARD 7.5X6 YLW CONV (MISCELLANEOUS) ×6 IMPLANT
RELOAD STAPLE 60 BLK XTHK ART (STAPLE) IMPLANT
RELOAD TRI 2.0 60 XTHK VAS SUL (STAPLE) ×33 IMPLANT
SEALANT SURG COSEAL 4ML (VASCULAR PRODUCTS) ×2 IMPLANT
SEALANT SURG COSEAL 8ML (VASCULAR PRODUCTS) IMPLANT
SOLUTION ANTI FOG 6CC (MISCELLANEOUS) ×3 IMPLANT
STAPLE ECHEON FLEX 60 POW ENDO (STAPLE) IMPLANT
STAPLER ENDO GIA 12 SHRT THIN (STAPLE) IMPLANT
STAPLER ENDO GIA 12MM SHORT (STAPLE) ×1 IMPLANT
SUT PROLENE 3 0 SH DA (SUTURE) IMPLANT
SUT PROLENE 4 0 RB 1 (SUTURE)
SUT PROLENE 4-0 RB1 .5 CRCL 36 (SUTURE) IMPLANT
SUT SILK  1 MH (SUTURE) ×4
SUT SILK 1 MH (SUTURE) ×2 IMPLANT
SUT SILK 1 TIES 10X30 (SUTURE) IMPLANT
SUT SILK 2 0 SH (SUTURE) ×6 IMPLANT
SUT SILK 2 0SH CR/8 30 (SUTURE) IMPLANT
SUT SILK 3 0SH CR/8 30 (SUTURE) IMPLANT
SUT VIC AB 1 CTX 36 (SUTURE) ×3
SUT VIC AB 1 CTX36XBRD ANBCTR (SUTURE) ×1 IMPLANT
SUT VIC AB 2-0 CT1 27 (SUTURE) ×3
SUT VIC AB 2-0 CT1 TAPERPNT 27 (SUTURE) IMPLANT
SUT VIC AB 2-0 CTX 36 (SUTURE) ×3 IMPLANT
SUT VIC AB 2-0 UR6 27 (SUTURE) ×2 IMPLANT
SUT VIC AB 3-0 MH 27 (SUTURE) IMPLANT
SUT VIC AB 3-0 SH 27 (SUTURE) ×3
SUT VIC AB 3-0 SH 27X BRD (SUTURE) IMPLANT
SUT VIC AB 3-0 X1 27 (SUTURE) ×7 IMPLANT
SUT VIC AB 4-0 PS2 27 (SUTURE) ×2 IMPLANT
SUT VICRYL 2 TP 1 (SUTURE) ×3 IMPLANT
SWAB COLLECTION DEVICE MRSA (MISCELLANEOUS) IMPLANT
SWAB CULTURE ESWAB REG 1ML (MISCELLANEOUS) IMPLANT
SYSTEM SAHARA CHEST DRAIN ATS (WOUND CARE) ×3 IMPLANT
TIP APPLICATOR SPRAY EXTEND 16 (VASCULAR PRODUCTS) IMPLANT
TOWEL GREEN STERILE (TOWEL DISPOSABLE) ×12 IMPLANT
TOWEL GREEN STERILE FF (TOWEL DISPOSABLE) ×6 IMPLANT
TOWEL OR 17X24 6PK STRL BLUE (TOWEL DISPOSABLE) ×3 IMPLANT
TOWEL OR 17X26 10 PK STRL BLUE (TOWEL DISPOSABLE) ×6 IMPLANT
TRAP SPECIMEN MUCOUS 40CC (MISCELLANEOUS) IMPLANT
TRAY FOLEY W/METER SILVER 14FR (SET/KITS/TRAYS/PACK) ×3 IMPLANT
TUNNELER SHEATH ON-Q 11GX8 DSP (PAIN MANAGEMENT) IMPLANT
WATER STERILE IRR 1000ML POUR (IV SOLUTION) ×6 IMPLANT

## 2016-07-10 NOTE — Transfer of Care (Signed)
Immediate Anesthesia Transfer of Care Note  Patient: Kelly Olson.  Procedure(s) Performed: Procedure(s): VIDEO ASSISTED THORACOSCOPY (VATS)/WEDGE RESECTION (Right) THORACOTOMY (Right)  Patient Location: PACU  Anesthesia Type:General  Level of Consciousness: awake, alert , oriented and patient cooperative  Airway & Oxygen Therapy: Patient Spontanous Breathing and Patient connected to face mask oxygen  Post-op Assessment: Report given to RN, Post -op Vital signs reviewed and stable and Patient moving all extremities X 4  Post vital signs: Reviewed and stable  Last Vitals:  Vitals:   07/10/16 0601  BP: (!) 146/70  Pulse: 64  Resp: 20  Temp: 36.9 C    Last Pain: There were no vitals filed for this visit.    Patients Stated Pain Goal: 3 (15/94/58 5929)  Complications: No apparent anesthesia complications

## 2016-07-10 NOTE — Plan of Care (Signed)
Problem: Activity: Goal: Risk for activity intolerance will decrease Outcome: Progressing Patient dangled and stood at bedside with minimal assist.   Problem: Respiratory: Goal: Pain level will decrease with appropriate interventions Outcome: Progressing Patient well controlled with PCA Goal: Respiratory status will improve Outcome: Progressing Patient on 2L O2, Pulling 1375 on IS.

## 2016-07-10 NOTE — Progress Notes (Signed)
      [address]       [place] [address]             [phone]      S/p VATS, wedge resection  BP 127/77   Pulse 62   Temp 98 F (36.7 C) (Oral)   Resp 13   Ht 5\' 11"  (1.803 m)   Wt 165 lb 2 oz (74.9 kg)   SpO2 95%   BMI 23.03 kg/m    Intake/Output Summary (Last 24 hours) at 07/10/16 1733 Last data filed at 07/10/16 1714  Gross per 24 hour  Intake             3220 ml  Output              839 ml  Net             2381 ml   Some bleeding at CT site  Doing well early postop  Remo Lipps C. Roxan Hockey, MD [facility] [phone]

## 2016-07-10 NOTE — Op Note (Signed)
CARDIOTHORACIC SURGERY OPERATIVE NOTE  07/10/2016 Kelly Olson [id number]  Surgeon:  Gaye Pollack, MD  First Assistant: Nicholes Rough, PA-C   Preoperative Diagnosis:  Right lower lobe lung nodule   Postoperative Diagnosis: Non-small cell carcinoma RLL  Procedure:  1. Right video-assisted thoracoscopy 2. Right mini thoracotomy 3. Wedge resection of right lower lobe lung nodule  Anesthesia:  General Endotracheal   Clinical History/Surgical Indication:  This 66 year old gentleman has a history of heavy prior smoking until 2016 and has been followed with a lung cancer screening CT due to his risk factors. His current scan shows a new 1.1 cm spiculated hypermetabolic nodule in the posterior RLL that was not present on his scan a year ago. He has several other nodules that have been stable on CT dating back to 2013. I have personally reviewed and interpreted his current CT and PET scan and compared it to all of his other prior CT's. This RLL lesion is new and suspicious for lung cancer. His lung function is adequate to tolerate a right lower lobectomy if needed but this lesion is peripheral and should be amenable to a wedge resection. With his severe diffusion capacity defect I think a wedge resection will be a better option for him to preserve as much lung as possible with lower operative risk and better functional capacity. I discussed the operative procedure with him and his wife including alternatives, benefits and risks including but not limited to bleeding, blood transfusion, infection, positive surgical margin requiring lobectomy, persistent air leak, respiratory failure and recurrent cancer. They understand and agree to proceed.  Preparation:  The patient was seen in the preoperative holding area and the correct patient, correct operation, correct operative side were confirmed with the patient after reviewing the medical record and CT scan. The consent was signed by me.  Preoperative antibiotics were given.  The patient was taken back to the operating room and positioned supine on the operating room table. After being placed under general endotracheal anesthesia by the anesthesia team using a double lumen tube a foley catheter was placed. The patient was turned into the left lateral decubitus position. The right chest was prepped with betadine soap and solution.  A surgical time-out was taken and the correct patient,operative side, and operative procedure were confirmed with the nursing and anesthesia staff.   Operative Procedure:  A 1 cm incision was made in the mid-axillary line at the 8th intercostal space. The right lung was deflated. A 10 mm trocar was inserted into the pleural space and a 30 degree thoracoscope was inserted. The pleural space was examined. There were no parietal pleural lesions. The nodule was located on the posterior aspect of the RLL. It appeared sub-pleural but easily palpated near the lung surface. Additional 1 cm incisions were made in the posterior axillary line and anterior axillary line in a triangular orientation for instruments and the scope. It was difficult to orient the staplers due to the close working distance posteriorly and the fact that the lesion was on the convex surface of the posterior RLL and not near the edge. Therefore the posterior incision was extended anteriorly into a mini-thoracotomy. The serratus and latissimus muscles were divided over a short distance and the pleural space entered. A small chest retractor was placed. The allowed accurate placement of the staplers and the wedge resection was performed without difficulty. The specimen was removed and sent to pathology. 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 The staple lines were coated with Co-seal. There was no residual air leak. A 28 F chest tube was placed through the mid-axillary incision and advanced posteriorly to the apex.  The ribs were re-approximated using a  #2 vicryl peri-costal suture.The muscles were closed using continuous #1 vicryl suture. The subcutaneous tissue was closed with 2-0 vicryl continuous suture. The skin was closed with 3-0 vicryl subcuticular suture. The small incisions were closed in a similar manner. All sponge, needle, and instrument counts were reported correct at the end of the case. Dry sterile dressings were placed over the incisions and around the chest tubes which were connected to pleurevac suction. The patient was turned supine, extubated,then transported to the PACU in satisfactory and stable condition.

## 2016-07-10 NOTE — Anesthesia Postprocedure Evaluation (Signed)
Anesthesia Post Note  Patient: Kelly Olson.  Procedure(s) Performed: Procedure(s) (LRB): VIDEO ASSISTED THORACOSCOPY (VATS)/WEDGE RESECTION (Right) THORACOTOMY (Right)     Anesthesia Post Evaluation  Last Vitals:  Vitals:   07/10/16 1215 07/10/16 1230  BP:    Pulse: (!) 55 (!) 56  Resp: 15 17  Temp:      Last Pain:  Vitals:   07/10/16 1238  PainSc: 3                  Erendira Crabtree S

## 2016-07-10 NOTE — Anesthesia Procedure Notes (Signed)
Arterial Line Insertion Start/End6/11/2016 7:00 AM, 07/10/2016 8:05 AM Performed by: Myrtie Soman, anesthesiologist  Patient location: Pre-op. Preanesthetic checklist: patient identified, IV checked, site marked, risks and benefits discussed, surgical consent, monitors and equipment checked, pre-op evaluation and timeout performed Lidocaine 1% used for infiltration and patient sedated Left, radial was placed Catheter size: 20 G Hand hygiene performed , maximum sterile barriers used  and Seldinger technique used Allen's test indicative of satisfactory collateral circulation Attempts: 2 Procedure performed without using ultrasound guided technique. Ultrasound Notes:anatomy identified, needle tip was noted to be adjacent to the nerve/plexus identified and no ultrasound evidence of intravascular and/or intraneural injection Following insertion, dressing applied and Biopatch. Patient tolerated the procedure well with no immediate complications.

## 2016-07-10 NOTE — Interval H&P Note (Signed)
History and Physical Interval Note:  07/10/2016 6:42 AM  Kelly Olson.  has presented today for surgery, with the diagnosis of rll lung nodule  The various methods of treatment have been discussed with the patient and family. After consideration of risks, benefits and other options for treatment, the patient has consented to  Procedure(s): VIDEO ASSISTED THORACOSCOPY (VATS)/WEDGE RESECTION (Right) possible THORACOTOMY (Right) as a surgical intervention .  The patient's history has been reviewed, patient examined, no change in status, stable for surgery.  I have reviewed the patient's chart and labs.  Questions were answered to the patient's satisfaction.     Gaye Pollack

## 2016-07-10 NOTE — Brief Op Note (Signed)
07/10/2016  10:37 AM  PATIENT:  [name]  66 y.o. male  PRE-OPERATIVE DIAGNOSIS:  rll lung nodule  POST-OPERATIVE DIAGNOSIS:  rll lung nodule  PROCEDURE:  Procedure(s): VIDEO ASSISTED THORACOSCOPY (VATS)/WEDGE RESECTION (Right) THORACOTOMY (Right)  SURGEON:  Surgeon(s) and Role:    * Bartle, Fernande Boyden, MD - Primary  PHYSICIAN ASSISTANT:  Nicholes Rough, PA-C   ANESTHESIA:   general  EBL:  Total I/O In: 2250 [I.V.:2250] Out: 300 [Urine:270; Blood:30]  BLOOD ADMINISTERED:none  DRAINS: straight chest tube   LOCAL MEDICATIONS USED:  NONE  SPECIMEN:  Source of Specimen:  right lower lung wedge   DISPOSITION OF SPECIMEN:  PATHOLOGY  COUNTS:  YES  TOURNIQUET:  * No tourniquets in log *  DICTATION: .Dragon Dictation  PLAN OF CARE: Admit to inpatient   PATIENT DISPOSITION:  ICU - extubated and stable.   Delay start of Pharmacological VTE agent (>24hrs) due to surgical blood loss or risk of bleeding: yes

## 2016-07-10 NOTE — Anesthesia Procedure Notes (Signed)
Procedure Name: Intubation Date/Time: 07/10/2016 7:38 AM Performed by: Everlean Cherry A Pre-anesthesia Checklist: Patient identified, Emergency Drugs available, Suction available, Patient being monitored and Timeout performed Patient Re-evaluated:Patient Re-evaluated prior to inductionOxygen Delivery Method: Circle system utilized Preoxygenation: Pre-oxygenation with 100% oxygen Intubation Type: IV induction Ventilation: Mask ventilation without difficulty Laryngoscope Size: Glidescope and 3 Grade View: Grade I Tube type: Oral Nasal Tubes: Left Endobronchial tube: Left, Double lumen EBT, EBT position confirmed by auscultation and EBT position confirmed by fiberoptic bronchoscope and 39 Fr Number of attempts: 4 Airway Equipment and Method: Stylet,  Video-laryngoscopy and Fiberoptic brochoscope Placement Confirmation: ETT inserted through vocal cords under direct vision,  positive ETCO2,  CO2 detector and breath sounds checked- equal and bilateral Secured at: 29 cm Tube secured with: Tape Dental Injury: Bloody posterior oropharynx  Comments: DL x 3 with Mac 3, Mac 4, and Miller 3. Inadequate view. Glidescope blade 3 used. Some blood noted in Oropharynx.

## 2016-07-10 NOTE — Anesthesia Procedure Notes (Signed)
Anesthesia Procedure Image    

## 2016-07-10 NOTE — Anesthesia Procedure Notes (Signed)
Central Venous Catheter Insertion Performed by: Myrtie Soman, anesthesiologist Start/End6/11/2016 7:00 AM, 07/10/2016 7:14 AM Patient location: Pre-op. Preanesthetic checklist: patient identified, IV checked, site marked, risks and benefits discussed, surgical consent, monitors and equipment checked, pre-op evaluation, timeout performed and anesthesia consent Position: Trendelenburg Lidocaine 1% used for infiltration and patient sedated Hand hygiene performed , maximum sterile barriers used  and Seldinger technique used Catheter size: 8 Fr Total catheter length 16. Central line was placed.Double lumen Procedure performed using ultrasound guided technique. Ultrasound Notes:anatomy identified, needle tip was noted to be adjacent to the nerve/plexus identified, no ultrasound evidence of intravascular and/or intraneural injection and image(s) printed for medical record Attempts: 1 Following insertion, dressing applied, line sutured and Biopatch. Post procedure assessment: blood return through all ports  Patient tolerated the procedure well with no immediate complications.

## 2016-07-11 ENCOUNTER — Encounter (HOSPITAL_COMMUNITY): Payer: Self-pay | Admitting: Surgery

## 2016-07-11 ENCOUNTER — Inpatient Hospital Stay (HOSPITAL_COMMUNITY): Payer: Medicare Other

## 2016-07-11 LAB — BASIC METABOLIC PANEL
ANION GAP: 8 (ref 5–15)
BUN: 15 mg/dL (ref 6–20)
CO2: 24 mmol/L (ref 22–32)
Calcium: 8.5 mg/dL — ABNORMAL LOW (ref 8.9–10.3)
Chloride: 103 mmol/L (ref 101–111)
Creatinine, Ser: 0.82 mg/dL (ref 0.61–1.24)
GFR calc non Af Amer: >60 mL/min (ref >60)
Glucose, Bld: 133 mg/dL — ABNORMAL HIGH (ref 65–99)
POTASSIUM: 4 mmol/L (ref 3.5–5.1)
SODIUM: 135 mmol/L (ref 135–145)

## 2016-07-11 LAB — GLUCOSE, CAPILLARY: GLUCOSE-CAPILLARY: 145 mg/dL — AB (ref 65–99)

## 2016-07-11 LAB — CBC
HCT: 42 % (ref 39.0–52.0)
Hemoglobin: 14 g/dL (ref 13.0–17.0)
MCH: 31 pg (ref 26.0–34.0)
MCHC: 33.3 g/dL (ref 30.0–36.0)
MCV: 93.1 fL (ref 78.0–100.0)
PLATELETS: 241 10*3/uL (ref 150–400)
RBC: 4.51 MIL/uL (ref 4.22–5.81)
RDW: 12.8 % (ref 11.5–15.5)
WBC: 16.6 10*3/uL — AB (ref 4.0–10.5)

## 2016-07-11 MED ORDER — SODIUM CHLORIDE 0.9% FLUSH: 10.0000 mL | INTRAVENOUS | Status: DC | PRN

## 2016-07-11 MED ORDER — KETOROLAC TROMETHAMINE 15 MG/ML IJ SOLN
15.0000 mg | Freq: Four times a day (QID) | INTRAMUSCULAR | Status: AC | PRN
  Administered 2016-07-11 – 2016-07-14 (×9): 15 mg via INTRAVENOUS
  Filled 2016-07-11 (×9): qty 1

## 2016-07-11 MED ORDER — SODIUM CHLORIDE 0.9% FLUSH
10.0000 mL | Freq: Two times a day (BID) | INTRAVENOUS | Status: DC
  Administered 2016-07-11 – 2016-07-12 (×3): 10 mL

## 2016-07-11 MED ORDER — CHLORHEXIDINE GLUCONATE CLOTH 2 % EX PADS
6.0000 | MEDICATED_PAD | Freq: Every day | CUTANEOUS | Status: DC
  Administered 2016-07-11 – 2016-07-12 (×2): 6 via TOPICAL

## 2016-07-11 NOTE — Progress Notes (Signed)
Fentanyl PCA discontinued, 23 ml waste witnessed by Jinny Blossom, Therapist, sports.

## 2016-07-11 NOTE — Care Management Note (Signed)
Case Management Note Marvetta Gibbons RN, BSN Unit 2W-Case [place] coverage [phone]  Patient Details  Name: Kelly Olson. MRN: [id number] Date of Birth: 12-29-1950  Subjective/Objective:   Pt admitted s/p VATS, wedge resection- thoracotomy-on 6/11/8                 Action/Plan: PTA pt lived at home with wife- PCP- [name]-  Anticipate return with wife- CM to follow for d/c needs  Expected Discharge Date:                  Expected Discharge Plan:  Home/Self Care  In-House Referral:     Discharge planning Services  CM Consult  Post Acute Care Choice:    Choice offered to:     DME Arranged:    DME Agency:     HH Arranged:    [place] Agency:     Status of Service:  In process, will continue to follow  If discussed at Long Length of Stay Meetings, dates discussed:    Discharge Disposition:   Additional Comments:  Dawayne Patricia, RN 07/11/2016, 10:27 AM

## 2016-07-11 NOTE — Progress Notes (Signed)
CT surgery p.m. Rounds  Comfortable sitting up in chair Room air ox and saturation 94% No air leak minimal chest tube drainage Good breath sounds Doing well

## 2016-07-11 NOTE — Progress Notes (Signed)
1 Day Post-Op Procedure(s) (LRB): VIDEO ASSISTED THORACOSCOPY (VATS)/WEDGE RESECTION (Right) THORACOTOMY (Right) Subjective:  No complaints, no significant pain, not using PCA  Objective: Vital signs in last 24 hours: Temp:  [97.2 F (36.2 C)-98.2 F (36.8 C)] 98.2 F (36.8 C) (06/12 0300) Pulse Rate:  [49-78] 52 (06/12 0800) Cardiac Rhythm: Sinus bradycardia (06/12 0735) Resp:  [11-22] 19 (06/12 0800) BP: (100-144)/(51-82) 116/71 (06/12 0800) SpO2:  [88 %-100 %] 95 % (06/12 0800) Arterial Line BP: (102-163)/(47-84) 136/52 (06/12 0800) Weight:  [73.5 kg (162 lb)-74.9 kg (165 lb 2 oz)] 73.5 kg (162 lb) (06/12 0530)  Hemodynamic parameters for last 24 hours:    Intake/Output from previous day: 06/11 0701 - 06/12 0700 In: 5180 [P.O.:720; I.V.:4310; IV Piggyback:150] Out: 1610 [Urine:3345; Blood:30; Chest Tube:74] Intake/Output this shift: Total I/O In: 10 [I.V.:10] Out: 155 [Urine:145; Chest Tube:10]  General appearance: alert and cooperative Heart: regular rate and rhythm, S1, S2 normal, no murmur, click, rub or gallop Lungs: clear to auscultation bilaterally Wound: some drainage around chest tube no air leak  Lab Results:  Recent Labs  07/10/16 1748 07/11/16 0331  WBC 14.7* 16.6*  HGB 14.2 14.0  HCT 42.8 42.0  PLT 238 241   BMET:  Recent Labs  07/11/16 0331  NA 135  K 4.0  CL 103  CO2 24  GLUCOSE 133*  BUN 15  CREATININE 0.82  CALCIUM 8.5*    PT/INR: No results for input(s): LABPROT, INR in the last 72 hours. ABG    Component Value Date/Time   PHART 7.453 (H) 07/07/2016 1255   HCO3 26.2 07/07/2016 1255   TCO2 23 06/05/2014 1652   ACIDBASEDEF 2.0 06/04/2014 1851   O2SAT 96.2 07/07/2016 1255   CBG (last 3)   Recent Labs  07/10/16 1612 07/10/16 2029 07/10/16 2345  GLUCAP 148* 213* 156*   CXR: mild bibasilar atelectasis  Assessment/Plan: S/P Procedure(s) (LRB): VIDEO ASSISTED THORACOSCOPY (VATS)/WEDGE RESECTION (Right) THORACOTOMY  (Right)  He is hemodynamically stable in sinus rhythm.  CT to water seal DC arterial line, foley Will DC PCA since not using it and use oral pain meds. Continue IS, ambulation   LOS: 1 day    Kelly Olson 07/11/2016

## 2016-07-12 ENCOUNTER — Inpatient Hospital Stay (HOSPITAL_COMMUNITY): Payer: Medicare Other

## 2016-07-12 ENCOUNTER — Encounter (HOSPITAL_COMMUNITY): Payer: Self-pay

## 2016-07-12 LAB — CBC
HEMATOCRIT: 42.8 % (ref 39.0–52.0)
HEMOGLOBIN: 14.1 g/dL (ref 13.0–17.0)
MCH: 31.3 pg (ref 26.0–34.0)
MCHC: 32.9 g/dL (ref 30.0–36.0)
MCV: 95.1 fL (ref 78.0–100.0)
Platelets: 239 10*3/uL (ref 150–400)
RBC: 4.5 MIL/uL (ref 4.22–5.81)
RDW: 13.3 % (ref 11.5–15.5)
WBC: 14.8 10*3/uL — ABNORMAL HIGH (ref 4.0–10.5)

## 2016-07-12 LAB — COMPREHENSIVE METABOLIC PANEL
ALK PHOS: 71 U/L (ref 38–126)
ALT: 22 U/L (ref 17–63)
AST: 30 U/L (ref 15–41)
Albumin: 3.3 g/dL — ABNORMAL LOW (ref 3.5–5.0)
Anion gap: 8 (ref 5–15)
BILIRUBIN TOTAL: 0.7 mg/dL (ref 0.3–1.2)
BUN: 23 mg/dL — ABNORMAL HIGH (ref 6–20)
CALCIUM: 8.4 mg/dL — AB (ref 8.9–10.3)
CO2: 25 mmol/L (ref 22–32)
CREATININE: 0.87 mg/dL (ref 0.61–1.24)
Chloride: 102 mmol/L (ref 101–111)
GFR calc non Af Amer: >60 mL/min (ref >60)
Glucose, Bld: 109 mg/dL — ABNORMAL HIGH (ref 65–99)
Potassium: 3.8 mmol/L (ref 3.5–5.1)
Sodium: 135 mmol/L (ref 135–145)
TOTAL PROTEIN: 6.3 g/dL — AB (ref 6.5–8.1)

## 2016-07-12 MED ORDER — VITAMIN D (CHOLECALCIFEROL) 25 MCG (1000 UT) PO CAPS: 1.0000 | ORAL_CAPSULE | Freq: Every day | ORAL | Status: DC

## 2016-07-12 MED ORDER — CARVEDILOL 6.25 MG PO TABS
6.2500 mg | ORAL_TABLET | Freq: Two times a day (BID) | ORAL | Status: DC
  Administered 2016-07-12 – 2016-07-26 (×29): 6.25 mg via ORAL
  Filled 2016-07-12 (×30): qty 1

## 2016-07-12 MED ORDER — ASPIRIN EC 81 MG PO TBEC
81.0000 mg | DELAYED_RELEASE_TABLET | Freq: Every day | ORAL | Status: DC
  Administered 2016-07-12 – 2016-07-26 (×15): 81 mg via ORAL
  Filled 2016-07-12 (×16): qty 1

## 2016-07-12 MED ORDER — VITAMIN D 1000 UNITS PO TABS
1000.0000 [IU] | ORAL_TABLET | Freq: Every day | ORAL | Status: DC
  Administered 2016-07-12 – 2016-07-26 (×15): 1000 [IU] via ORAL
  Filled 2016-07-12 (×16): qty 1

## 2016-07-12 MED ORDER — ASPIRIN 81 MG PO TABS: 81.0000 mg | ORAL_TABLET | Freq: Every day | ORAL | Status: DC

## 2016-07-12 MED ORDER — LOSARTAN POTASSIUM 25 MG PO TABS
12.5000 mg | ORAL_TABLET | Freq: Every day | ORAL | Status: DC
Start: 2016-07-12 — End: 2016-07-14
  Administered 2016-07-12 – 2016-07-13 (×2): 12.5 mg via ORAL
  Filled 2016-07-12 (×2): qty 1

## 2016-07-12 NOTE — Progress Notes (Signed)
2 Days Post-Op Procedure(s) (LRB): VIDEO ASSISTED THORACOSCOPY (VATS)/WEDGE RESECTION (Right) THORACOTOMY (Right) Subjective:  No complaints. Took some oxy IR this am for pain but feels fine now  Objective: Vital signs in last 24 hours: Temp:  [98.1 F (36.7 C)-98.3 F (36.8 C)] 98.3 F (36.8 C) (06/13 0400) Pulse Rate:  [37-75] 37 (06/13 0600) Cardiac Rhythm: Sinus bradycardia (06/13 0400) Resp:  [11-24] 21 (06/13 0600) BP: (105-152)/(57-85) 141/68 (06/13 0600) SpO2:  [91 %-97 %] 92 % (06/13 0600) Arterial Line BP: (136)/(52) 136/52 (06/12 0800)  Hemodynamic parameters for last 24 hours:    Intake/Output from previous day: 06/12 0701 - 06/13 0700 In: 1364.2 [P.O.:1320; I.V.:44.2] Out: 939 [Urine:895; Chest Tube:44] Intake/Output this shift: No intake/output data recorded.  General appearance: alert and cooperative Heart: regular rate and rhythm, S1, S2 normal, no murmur, click, rub or gallop Lungs: clear to auscultation bilaterally Wound: dressings fairly dry this am minimal drainage and no air leak from chest tube.  Lab Results:  Recent Labs  07/11/16 0331 07/12/16 0454  WBC 16.6* 14.8*  HGB 14.0 14.1  HCT 42.0 42.8  PLT 241 239   BMET:  Recent Labs  07/11/16 0331 07/12/16 0454  NA 135 135  K 4.0 3.8  CL 103 102  CO2 24 25  GLUCOSE 133* 109*  BUN 15 23*  CREATININE 0.82 0.87  CALCIUM 8.5* 8.4*    PT/INR: No results for input(s): LABPROT, INR in the last 72 hours. ABG    Component Value Date/Time   PHART 7.453 (H) 07/07/2016 1255   HCO3 26.2 07/07/2016 1255   TCO2 23 06/05/2014 1652   ACIDBASEDEF 2.0 06/04/2014 1851   O2SAT 96.2 07/07/2016 1255   CBG (last 3)   Recent Labs  07/10/16 2029 07/10/16 2345 07/11/16 0347  GLUCAP 213* 156* 145*   CXR: no ptx and mild residual density in RLL.  Assessment/Plan: S/P Procedure(s) (LRB): VIDEO ASSISTED THORACOSCOPY (VATS)/WEDGE RESECTION (Right) THORACOTOMY (Right)  He is doing well. Will  remove chest tube today and do a follow up CXR. Check 2V CXR in am and if no problems he can go home tomorrow.   Continue IS, ambulation  DC central line and transfer to 2W.  Final pathology pending     LOS: 2 days    Gaye Pollack 07/12/2016

## 2016-07-12 NOTE — Progress Notes (Signed)
Pt arrived to 2w from 2h. Pt oriented to room and staff. Telemetry box applied and CCMD notified. Vitals obtained. Pt denies needs at this time. Will continue current plan of care.  Grant Fontana BSN, RN

## 2016-07-13 ENCOUNTER — Inpatient Hospital Stay (HOSPITAL_COMMUNITY): Payer: Medicare Other

## 2016-07-13 DIAGNOSIS — J95811 Postprocedural pneumothorax: Secondary | ICD-10-CM

## 2016-07-13 MED ORDER — MORPHINE SULFATE (PF) 2 MG/ML IV SOLN
2.0000 mg | Freq: Once | INTRAVENOUS | Status: AC
  Administered 2016-07-13: 2 mg via INTRAVENOUS

## 2016-07-13 MED ORDER — MIDAZOLAM HCL 2 MG/2ML IJ SOLN
INTRAMUSCULAR | Status: AC
  Administered 2016-07-13: 2 mg
  Filled 2016-07-13: qty 2

## 2016-07-13 MED ORDER — MORPHINE SULFATE (PF) 2 MG/ML IV SOLN
INTRAVENOUS | Status: AC
  Filled 2016-07-13: qty 1

## 2016-07-13 MED ORDER — MORPHINE SULFATE (PF) 2 MG/ML IV SOLN
INTRAVENOUS | Status: AC
  Administered 2016-07-13: 2 mg via INTRAMUSCULAR
  Filled 2016-07-13: qty 1

## 2016-07-13 MED ORDER — LIDOCAINE HCL (PF) 1 % IJ SOLN
INTRAMUSCULAR | Status: AC
  Administered 2016-07-13: 30 mL
  Filled 2016-07-13: qty 30

## 2016-07-13 MED ORDER — MIDAZOLAM HCL 2 MG/2ML IJ SOLN
INTRAMUSCULAR | Status: AC
  Filled 2016-07-13: qty 2

## 2016-07-13 NOTE — Significant Event (Signed)
Rapid Response Event Note RN called for new onset of swelling to Left upper chest area, pt c/o tightness to his neck Overview: Time Called: 0556 Arrival Time: 1423 Event Type: Respiratory  Initial Focused Assessment: On arrival pt sitting up in chair being transported to xr for previously ordered 2 view CXR for a follow up from chest tube removal yesterday. Skin warm and dry, a/o x4. Breath sounds diminished on Right side  RR 18, HR 79, BP 167/90, 98% RA  Interventions: Transported to XR, assisted with chest tube placement  Plan of Care (if not transferred):  Event Summary: Name of Physician Notified: Dr. Roxan Hockey at [phone]    at    Outcome: Stayed in room and stabalized     [place], [place]

## 2016-07-13 NOTE — Care Management Important Message (Signed)
Important Message  Patient Details  Name: Kelly Olson. MRN: [id number] Date of Birth: [birth year]   Medicare Important Message Given:  Yes    Nathen May 07/13/2016, 9:43 AM

## 2016-07-13 NOTE — Progress Notes (Signed)
3 Days Post-Op Procedure(s) (LRB): VIDEO ASSISTED THORACOSCOPY (VATS)/WEDGE RESECTION (Right) THORACOTOMY (Right) Subjective:  He had some coughing this am and then started having subcutaneous emphysema in face and neck. CXR showed new right hydropneumothorax. Chest tube inserted by Dr. Roxan Hockey. Follow up CXR pending.  Objective: Vital signs in last 24 hours: Temp:  [96.5 F (35.8 C)-98.7 F (37.1 C)] 98.2 F (36.8 C) (06/14 0604) Pulse Rate:  [58-84] 79 (06/14 0604) Cardiac Rhythm: Normal sinus rhythm (06/13 2200) Resp:  [10-21] 20 (06/14 0604) BP: (134-167)/(60-90) 167/90 (06/14 0604) SpO2:  [95 %-98 %] 98 % (06/14 0604)  Hemodynamic parameters for last 24 hours:    Intake/Output from previous day: 06/13 0701 - 06/14 0700 In: 720 [P.O.:720] Out: -  Intake/Output this shift: No intake/output data recorded.  General appearance: alert and cooperative Heart: regular rate and rhythm, S1, S2 normal, no murmur, click, rub or gallop Lungs: clear to auscultation bilaterally Wound: incisions ok subcutaneous emphysema over chest, neck and face. No air leak from chest tube at this time.  Lab Results:  Recent Labs  07/11/16 0331 07/12/16 0454  WBC 16.6* 14.8*  HGB 14.0 14.1  HCT 42.0 42.8  PLT 241 239   BMET:  Recent Labs  07/11/16 0331 07/12/16 0454  NA 135 135  K 4.0 3.8  CL 103 102  CO2 24 25  GLUCOSE 133* 109*  BUN 15 23*  CREATININE 0.82 0.87  CALCIUM 8.5* 8.4*    PT/INR: No results for input(s): LABPROT, INR in the last 72 hours. ABG    Component Value Date/Time   PHART 7.453 (H) 07/07/2016 1255   HCO3 26.2 07/07/2016 1255   TCO2 23 06/05/2014 1652   ACIDBASEDEF 2.0 06/04/2014 1851   O2SAT 96.2 07/07/2016 1255   CBG (last 3)   Recent Labs  07/10/16 2029 07/10/16 2345 07/11/16 0347  GLUCAP 213* 156* 145*    Assessment/Plan: S/P Procedure(s) (LRB): VIDEO ASSISTED THORACOSCOPY (VATS)/WEDGE RESECTION (Right) THORACOTOMY (Right)  Air leak  after chest tube removal yesterday probably related to coughing episode this am. Chest tube reinserted. Follow up CXR pending. Will keep to suction today but can still ambulate off suction. 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 There is visceral pleural involvement. This will not require any additional treatment but close surveillance. I discussed results and plans with patient and wife.   LOS: 3 days    Gaye Pollack 07/13/2016

## 2016-07-13 NOTE — Procedures (Signed)
Kelly Olson was noted to have subcutaneous emphysema on exam this AM CXR showed a right hydropneumothorax  Informed consent obtained Premedicated with 2 mg versed and 4 mg morphine IV Sterile technique used 20 ml of 1% lidocaine for local 28 F chest tube placed right chest with + rush of air and initial air leak Tolerated well CXR ordered  Remo Lipps C. Roxan Hockey, MD [facility] [phone]

## 2016-07-13 NOTE — Progress Notes (Signed)
Patient c/o SOB, voice sounding funny, pain when swallowing and felt like there was a tube squeezing his neck. Vital signs stable with elevated BP. Oxygen sat 98% RA. Sub q air and swelling noted on left side of neck. Sub q air was also noted on right side of neck. MD notified. O2 2L was placed via [place] for comfort due to patient's SOB. Chest xray ordered stat. Xray obtained, showed subcutaneous air bilateral sides of neck and right side of chest wall to mediastinum. Hydro pneumothorax noted on right side. MD made aware. Chest tube inserted at bedside, 2mg  versed and 4 mg morphine given. Patient tolerated well. Chest tube to wall suction -20 cm. No air leak noted. Will continue to monitor.

## 2016-07-14 ENCOUNTER — Inpatient Hospital Stay (HOSPITAL_COMMUNITY): Payer: Medicare Other

## 2016-07-14 ENCOUNTER — Encounter (HOSPITAL_COMMUNITY): Payer: Self-pay

## 2016-07-14 MED ORDER — SODIUM CHLORIDE 0.9% FLUSH
3.0000 mL | Freq: Two times a day (BID) | INTRAVENOUS | Status: DC
  Administered 2016-07-14 – 2016-07-26 (×22): 3 mL via INTRAVENOUS

## 2016-07-14 MED ORDER — SODIUM CHLORIDE 0.9% FLUSH
3.0000 mL | INTRAVENOUS | Status: DC | PRN
  Administered 2016-07-21: 3 mL via INTRAVENOUS
  Filled 2016-07-14: qty 3

## 2016-07-14 MED ORDER — LOSARTAN POTASSIUM 25 MG PO TABS
25.0000 mg | ORAL_TABLET | Freq: Every day | ORAL | Status: DC
  Administered 2016-07-14 – 2016-07-26 (×13): 25 mg via ORAL
  Filled 2016-07-14 (×14): qty 1

## 2016-07-14 NOTE — Progress Notes (Signed)
Patient noted to have increased swelling to his face from subcutaneous emphysema, vital signs remained stable see flowsheet, breathing even and unlabored, no sign of distress noted or voiced, Jadene Pierini, [place] notified, will continue to monitor.

## 2016-07-14 NOTE — Progress Notes (Signed)
Patient transferred to [facility] via hospital bed along with his personal belongings.

## 2016-07-14 NOTE — Progress Notes (Signed)
This RN made attempt to call report to RN on 2 H, awaiting their call back.

## 2016-07-14 NOTE — Progress Notes (Signed)
Around 0215, the patient would cough and would hear a whiny noise coming from the chest tube site. All the connections were checked and intact. This RN noticed that when the patient coughed, the fluid in the tube would drain into the cannister and would show bubbling in the air leak cannister. Rapid response was called to assess the patient. Scheduled CXR was moved up to stat. Around 0250, notified MD concerning a possible air leak with the chest tube and that the CXR was done. Return call from MD stating that CXR looked okay and to keep patient on suction and monitor. MD will assess the patient on AM rounding. Patient remains asymptomatic, VSS. Will continue to monitor

## 2016-07-14 NOTE — Progress Notes (Addendum)
      [address]       [place] [address]             [phone]      4 Days Post-Op Procedure(s) (LRB): VIDEO ASSISTED THORACOSCOPY (VATS)/WEDGE RESECTION (Right) THORACOTOMY (Right)   Subjective:  Patient without complaints.  Events of overnight noted.  Patient states him and nursing were speaking and after he coughed they noticed a whistling noise from his chest tubes.  Denies hemoptysis.  + ambulation + BM  Objective: Vital signs in last 24 hours: Temp:  [97.8 F (36.6 C)-98.2 F (36.8 C)] 98.2 F (36.8 C) (06/15 0234) Pulse Rate:  [57-77] 57 (06/15 0234) Cardiac Rhythm: Normal sinus rhythm (06/15 0700) Resp:  [18-19] 18 (06/15 0234) BP: (130-153)/(63-87) 153/68 (06/15 0234) SpO2:  [93 %-99 %] 94 % (06/15 0234)  Intake/Output from previous day: 06/14 0701 - 06/15 0700 In: 360 [P.O.:360] Out: 194 [Chest Tube:194]  General appearance: alert, cooperative and no distress Heart: regular rate and rhythm Lungs: clear to auscultation bilaterally Abdomen: soft, non-tender; bowel sounds normal; no masses,  no organomegaly Extremities: extremities normal, atraumatic, no cyanosis or edema Wound: clean and dry  Lab Results:  Recent Labs  07/12/16 0454  WBC 14.8*  HGB 14.1  HCT 42.8  PLT 239   BMET:  Recent Labs  07/12/16 0454  NA 135  K 3.8  CL 102  CO2 25  GLUCOSE 109*  BUN 23*  CREATININE 0.87  CALCIUM 8.4*    PT/INR: No results for input(s): LABPROT, INR in the last 72 hours. ABG    Component Value Date/Time   PHART 7.453 (H) 07/07/2016 1255   HCO3 26.2 07/07/2016 1255   TCO2 23 06/05/2014 1652   ACIDBASEDEF 2.0 06/04/2014 1851   O2SAT 96.2 07/07/2016 1255   CBG (last 3)  No results for input(s): GLUCAP in the last 72 hours.  Assessment/Plan: S/P Procedure(s) (LRB): VIDEO ASSISTED THORACOSCOPY (VATS)/WEDGE RESECTION (Right) THORACOTOMY (Right)  1. Chest tube- dressing reapplied, no air leak present this morning- leave  chest tube on suction today 2. Pulm- CXR with increase in right basilar, lateral pneumothorax 3. CV- hemodynamically stable, on home cardiac medications 4. Dispo- patient stable, repeat CXR in AM, no air leak present leave chest tube on suction today   LOS: 4 days    Ellwood Handler 07/14/2016    Chart reviewed, patient examined, agree with above. There was an air leak this am when I saw him but none after dressing changed with vaseline gauze around the tube entry site. Unclear if the air leak is from the lung or air around the tube. Subcutaneous air is grossly better today. Keep chest tube to suction and repeat CXR in am.

## 2016-07-14 NOTE — Discharge Summary (Signed)
Physician Discharge Summary  Patient ID: Kelly Olson. MRN: [id number] DOB/AGE: Dec 04, 1950 66 y.o.  Admit date: 07/10/2016 Discharge date: 07/26/2016  Admission Diagnoses: Right lower lobe lung mass  Discharge Diagnoses:  Active Problems:   S/P thoracotomy   Patient Active Problem List   Diagnosis Date Noted  . S/P thoracotomy 07/10/2016  . Cardiomyopathy, ischemic: EF ~35-45% by LV Gram --> 50 and 55% by echo 10/09/2014  . Coronary artery disease involving native coronary artery with angina pectoris ([place]) 06/04/2014  . Former heavy tobacco smoker - quit when he had diagnoses of CAD 06/01/2014  . Angina, class II ([place]) 06/01/2014  . Hyperlipidemia with target LDL less than 70 06/01/2014   HPI:  The patient is a 66 year old gentleman with hyperlipidemia, CABG by me in 05/2014, 46 pk-year smoking history but quit at time of CABG who has been followed by Dr. Laurann Montana with a lung cancer screening CT. His most recent CT on 06/13/2016 showed a new 1.1 cm spiculated nodule in the posterior RLL. He has a 9 mm subpleural nodule in the LLL and 9.7 mm subpleural nodule in the RML, both of which have been present since CT in 03/2011. There are additional scattered bilateral pulmonary nodules measuring up to 6.4 mm that are unchanged. He continues to feel well since his CABG in 2016. He goes to cardiac rehab every week and has good stamina. He denies any chest pain or shortness of breath. He has had no cough, sputum or hemoptysis. He denies any other symptoms.   He is here with his wife today who underwent treatment for breast cancer since I operated on him in 2016. 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   The patient was admitted electively for the procedure.   Discharged Condition: good  Hospital Course:  The patient was admitted electively and on 07/10/2016 he was taken to the operating room where he underwent the below described procedure. He tolerated well was taken to the  postanesthesia care unit in stable condition.  Post operative Hospital course:  The patient is overall done quite well. He has maintained stable hemodynamics in sinus rhythm. The chest tube is being monitored closely and will be discontinued prior to surgery. He is tolerating routine activity as per usual protocols. He did have an accumulation on subcutaneous emphysema where a chest tube was placed POD 3 on the right. The subcutaneous air continued to be an issues therefore an additional pigtail chest tube was placed on POD 7. Oxygen has been weaned and he maintains good saturations on room air. Incision is healing well. Pathology is noted below. The Subcutaneous emphysema resolved and the large bore chest tube was removed on 6/26 without issues. The pigtail chest tube was removed on 07/26/2016 without issue. A follow-up CXR was taken a few hours later which was stable. At discharge the patient is quite stable. He will follow-up in the office with Dr. Cyndia Bent in 1 week with a follow-up CXR.    Consults: None  Significant Diagnostic Studies: routine post op Labs/ CXR's  Treatments: Surgery  07/10/2016 Amman Bartel [id number]  Surgeon:  Gaye Pollack, MD  First Assistant: Nicholes Rough, PA-C   Preoperative Diagnosis:  Right lower lobe lung nodule   Postoperative Diagnosis: Non-small cell carcinoma RLL  Procedure:  1. Right video-assisted thoracoscopy 2. Right mini thoracotomy 3. Wedge resection of right lower lobe lung nodule  Anesthesia:  General Endotracheal  Discharge Exam: Blood pressure 124/65, pulse  61, temperature 97.6 F (36.4 C), temperature source Oral, resp. rate 18, height 5\' 11"  (1.803 m), weight 68 kg (149 lb 14.4 oz), SpO2 96 %.   General appearance: alert, cooperative and no distress Heart: regular rate and rhythm, S1, S2 normal, no murmur, click, rub or gallop Lungs: clear to auscultation bilaterally Abdomen: soft, non-tender; bowel sounds normal;  no masses,  no organomegaly Extremities: extremities normal, atraumatic, no cyanosis or edema Wound: both chest tube sites dressed with Vaseline gauze and 4 x 4 gauze dressing.   Disposition: 01-Home or Self Care   REPORT OF SURGICAL PATHOLOGY FINAL DIAGNOSIS Diagnosis Lung, wedge biopsy/resection, Right Lower Lobe - INVASIVE SQUAMOUS CELL CARCINOMA, 1.2 CM - INVOLVES THE VISCERAL PLEURA - MARGINS UNINVOLVED BY CARCINOMA - SEE ONCOLOGY TABLE BELOW Microscopic Comment LUNG Specimen, including laterality: Right, lower lobe Procedure: Wedge resection Specimen integrity: Intact Tumor site: Lower lobe Tumor focality: Single tumor Maximum tumor size: 1.2 cm Histologic type: Invasive squamous cell carcinoma, keratinizing Grade: Moderately Differentiated Margins: Uninvolved by carcinoma; see comment Distance to closest margin: 1 cm stapled margin Visceral pleura invasion: Present Direct Invasion of Adjacent Structures: No adjacent structures present Treatment effect: No known presurgical therapy Lymph -Vascular invasion: Not identified Lymph nodes: No lymph nodes submitted or found TNM code: pT2 , pNX Ancillary Studies: Best tumor block for sendout testing: 1D Comments: Tumor invades visceral pleura focally, which is best seen on the frozen section slide. Additionally, there is spread through air-spaces. By immunohistochemistry, the neoplasm is positive for cytokeratin 5/6 and p63 but negative for TTF-1 and Napsin-A, supporting the diagnosis of squamous cell carcinoma. Dr. Saralyn Pilar reviewed the case and agrees with the diagnosis. 1 of 2 FINAL for CHARLE, CLEAR. [phone]) Microscopic Comment(continued) Thressa Sheller MD Pathologist, Electronic Signature (Case signed 07/12/2016) Intraoperative Diagnosis RAPID INTRAOPERATIVE CONSULT: LUNG, RIGHT LOWER LOBE WEDGE RESECTION: - FROZEN SECTION A AND B. MARGIN - UNINVOLVED, TUMOR AT INKED PLEURA. - FROZEN SECTION C. MASS -  CARCINOMA. FAVOR SQUAMOUS CELL CARCINOMA (DB) Specimen Gross and Clinical Information Specimen(s) Obtained: Lung, wedge biopsy/resection, Right Lower Lobe Specimen Clinical Information RLL lung nodule (nt) Gross Specimen: Right lower lobe wedge Specimen integrity (intact/incised/disrupted): Intact, with three stapled areas. Size, weight: 82 grams, 7.4 x 6.2 x 3.5 cm Pleura: Pink red to dark red, slightly anthracotic, with few scattered adhesions. There is also a 1.2 cm area of indurated pleura with overlying adhesions. This area is inked black. Cut Surface: On sectioning through the area of indurated pleura, there is a 1.2 x 1.1 x 1.1 cm tan white firm ill defined subpleural mass. Margin(s): The subpleural mass is 1 cm from the nearest staple line. Block Summary: Tissue adjacent to the nearest staple line is submitted in blocks A, B for frozen section. A section of the mass is submitted in block C for frozen section. Additional sections of the mass and overlying pleura are submitted in blocks D, E for routine histology. Total = five blocks. (SSW:kh 07-10-16) Stain(s) used in Diagnosis: The following stain(s) were used in diagnosing the case: CK 5/6, Thyroid Transcription Factor -1, P63, Napsin-A. The control(s) stained appropriately.    Allergies as of 07/26/2016      Reactions   No Known Allergies       Medication List    TAKE these medications   aspirin 81 MG tablet Take 81 mg by mouth daily.   atorvastatin 40 MG tablet Commonly known as:  LIPITOR ALTERNATE 20 MG ( 1/2 TABLET) AND 40 MG (  1 TABLET) EVERY OTHER DAY   carvedilol 3.125 MG tablet Commonly known as:  COREG Take 2 tablets (6.25 mg total) by mouth 2 (two) times daily.   losartan 25 MG tablet Commonly known as:  COZAAR Take 1 tablet (25 mg total) by mouth daily. What changed:  how much to take   oxyCODONE 5 MG immediate release tablet Commonly known as:  Oxy IR/ROXICODONE Take 1 tablet (5 mg total) by mouth  every 12 (twelve) hours as needed for severe pain.   sildenafil 50 MG tablet Commonly known as:  VIAGRA Take 1 tablet (50 mg total) by mouth daily as needed for erectile dysfunction.   Vitamin D (Cholecalciferol) 1000 units Caps Take 1 capsule by mouth daily.   zinc sulfate 220 (50 Zn) MG capsule Take 1 capsule (220 mg total) by mouth daily.      Follow-up Information    Gaye Pollack, MD Follow up.   Specialty:  Cardiothoracic Surgery Why:  Appointment to see the surgeon on 08/01/2016 at 2:30pm. Please obtain a chest x-ray [facility] imaging at 2:00pm which is located in the same office complex. Contact information: [address] [place] [address] [phone]        [name], MD. Call in 1 day(s).   Specialty:  Internal Medicine Contact information: [address] [place] [address] [phone]           Signed: Elgie Collard 07/26/2016, 12:11 PM

## 2016-07-14 NOTE — Progress Notes (Signed)
      [address]       [place] [address]             [phone]      Up in chair  Increased SQ air earlier today. Stable since transfer  BP 134/83   Pulse (!) 56   Temp 97.7 F (36.5 C) (Oral)   Resp (!) 8   Ht 5\' 11"  (1.803 m)   Wt 162 lb (73.5 kg)   SpO2 94%   BMI 22.59 kg/m    Intake/Output Summary (Last 24 hours) at 07/14/16 1806 Last data filed at 07/14/16 1700  Gross per 24 hour  Intake              480 ml  Output               94 ml  Net              386 ml    CT functioning with positive air leak  Will place on [place] O2 to see that will help subq air resolve more quickly  [place] C. Roxan Hockey, MD [facility] [phone]

## 2016-07-14 NOTE — Progress Notes (Signed)
Patient ID: Kelly Sprung., male   DOB: Nov 18, 1950, 66 y.o.   MRN: [id number]  CT Surgery  Patient has developed more subcutaneous emphysema in face this afternoon. He noticed it with eating lunch. Follow up CXR shows no significant change from this am. There may be a small lateral and basilar ptx but I think this is just the lung edge that is not up against the chest wall after the wedge resection and lung deformation. There is still an fairly constant air leak in the [place]. I turned suction up to 30 cm. I think it would be best to transfer him back to [facility] for observation. He has significant COPD and warrants closer observation than can be provided on 2W.

## 2016-07-14 NOTE — Progress Notes (Signed)
RN called for "whistling" noise coming from chest tube and some bubbling I chamber when coughing. All connections were checked and dressing reinforced. Pt denied any SOB or pain. Morning scheduled cxr was modified to a stat order, MD paged made aware. MD returned page stating CXR was fine and he would assess pt on am rounds. Pt asymptomatic.

## 2016-07-14 NOTE — Discharge Instructions (Signed)
Lung Cancer Lung cancer occurs when abnormal cells in the lung grow out of control and form a mass (tumor). There are several types of lung cancer. The two most common types are:  Non-small cell. In this type of lung cancer, abnormal cells are larger and grow more slowly than those of small cell lung cancer.  Small cell. In this type of lung cancer, abnormal cells are smaller than those of non-small cell lung cancer. Small cell lung cancer gets worse faster than non-small cell lung cancer.  What are the causes? The leading cause of lung cancer is smoking tobacco. The second leading cause is radon exposure. What increases the risk?  Smoking tobacco.  Exposure to secondhand tobacco smoke.  Exposure to radon gas.  Exposure to asbestos.  Exposure to arsenic in drinking water.  Air pollution.  Family or personal history of lung cancer.  Lung radiation therapy.  Being older than 55 years. What are the signs or symptoms? In the early stages, symptoms may not be present. As the cancer progresses, symptoms may include:  A lasting cough, possibly with blood.  Fatigue.  Unexplained weight loss.  Shortness of breath.  Wheezing.  Chest pain.  Loss of appetite.  Symptoms of advanced lung cancer include:  Hoarseness.  Bone or joint pain.  Weakness.  Nail problems.  Face or arm swelling.  Paralysis of the face.  Drooping eyelids.  How is this diagnosed? Lung cancer can be identified with a physical exam and with tests such as:  A chest X-ray.  A CT scan.  Blood tests.  A biopsy.  After a diagnosis is made, you will have more tests to determine the stage of the cancer. The stages of non-small cell lung cancer are:  Stage 0, also called carcinoma in situ. At this stage, abnormal cells are found in the inner lining of your lung or lungs.  Stage I. At this stage, abnormal cells have grown into a tumor that is no larger than 5 cm across. The cancer has entered  the deeper lung tissue but has not yet entered the lymph nodes or other parts of the body.  Stage II. At this stage, the tumor is 7 cm across or smaller and has entered nearby lymph nodes. Or, the tumor is 5 cm across or smaller and has invaded surrounding tissue but is not found in nearby lymph nodes. There may be more than one tumor present.  Stage III. At this stage, the tumor may be any size. There may be more than one tumor in the lungs. The cancer cells have spread to the lymph nodes and possibly to other organs.  Stage IV. At this stage, there are tumors in both lungs and the cancer has spread to other areas of the body.  The stages of small cell lung cancer are:  Limited. At this stage, the cancer is found only on one side of the chest.  Extensive. At this stage, the cancer is in the lungs and in tissues on the other side of the chest. The cancer has spread to other organs or is found in the fluid between the layers of your lungs.  How is this treated? Depending on the type and stage of your lung cancer, you may be treated with:  Surgery. This is done to remove a tumor.  Radiation therapy. This treatment destroys cancer cells using X-rays or other types of radiation.  Chemotherapy. This treatment uses medicines to destroy cancer cells.  Targeted therapy. This treatment  aims to destroy only cancer cells instead of all cells as other therapies do.  You may also have a combination of treatments. Follow these instructions at home:  Do not use any tobacco products. This includes cigarettes, chewing tobacco, and electronic cigarettes. If you need help quitting, ask your health care provider.  Take medicines only as directed by your health care provider.  Eat a healthy diet. Work with a dietitian to make sure you are getting the nutrition you need.  Consider joining a support group or seeking counseling to help you cope with the stress of having lung cancer.  Let your cancer  specialist (oncologist) know if you are admitted to the hospital.  Keep all follow-up visits as directed by your health care provider. This is important. Contact a health care provider if:  You lose weight without trying.  You have a persistent cough and wheezing.  You feel short of breath.  You tire easily.  You experience bone or joint pain.  You have difficulty swallowing.  You feel hoarse or notice your voice changing.  Your pain medicine is not helping. Get help right away if:  You cough up blood.  You have new breathing problems.  You develop chest pain.  You develop swelling in: ? One or both ankles or legs. ? Your face, neck, or arms.  You are confused.  You experience paralysis in your face or a drooping eyelid. This information is not intended to replace advice given to you by your health care provider. Make sure you discuss any questions you have with your health care provider. Document Released: 04/24/2000 Document Revised: 06/24/2015 Document Reviewed: 05/22/2013 Elsevier Interactive Patient Education  2017 [place] [facility], Care After This sheet gives you information about how to care for yourself after your procedure. Your doctor may also give you more specific instructions. If you have problems or questions, contact your doctor. What can I expect after the procedure? After the procedure, it is common to have:  Some pain and soreness in your chest.  Pain when you breathe in (inhale) and cough.  Trouble pooping (constipation).  Tiredness (fatigue).  Trouble sleeping.  Follow these instructions at home: Preventing lung infection ( pneumonia)  Take deep breaths or do breathing exercises as told by your doctor.  Cough often. Coughing is important to clear thick spit (phlegm) and open your lungs.  You can make coughing hurt less if you try supporting (splinting) your chest. Try one of these when you cough: ? Hold a  pillow against your chest. ? Place both hands flat on top of your cut.  Use an incentive spirometer as told by your doctor. This is a tool that measures how well you can fill your lungs with each breath.  Do lung therapy (pulmonary rehabilitation) as told. Medicines  Take over-the-counter or prescription medicines only as told by your doctor.  If you have pain, take pain-relieving medicine before your pain gets very bad. Doing this will help you breathe and cough more comfortably.  If you were prescribed an antibiotic medicine, take it as told by your doctor. Do not stop taking the antibiotic even if you start to feel better. Activity  Ask your doctor what activities are safe for you.  Avoid activities that use your chest muscles for 3-4 weeks or longer.  Do not lift anything that is heavier than 10 lb (4.5 kg), or the limit that your doctor tells you, until he or she says that it is  safe. Cut ( incision) care  Follow instructions from your doctor about how to take care of your cut(s) from surgery. Make sure you: ? Wash your hands with soap and water before you change your bandage (dressing). If you cannot use soap and water, use hand sanitizer. ? Change your bandage as told by your doctor. ? Leave stitches (sutures), skin glue, or skin tape (adhesive) strips in place. They may need to stay in place for 2 weeks or longer. If tape strips get loose and curl up, you may trim the loose edges. Do not remove tape strips completely unless your doctor says it is okay.  Keep your bandage dry until it has been removed.  Every day, check the area around your cut(s) for signs of infection. Check for: ? Redness, swelling, or pain. ? Fluid or blood. ? Warmth. ? Pus or a bad smell. Bathing  Do not take baths, swim, or use a hot tub until your doctor approves. You may take showers.  After your bandage is removed, use soap and water to gently wash the your cut(s) from surgery. Do not use  anything else to clean your cut(s) unless your doctor tells you to do this. Driving  Do not drive until your doctor approves.  Do not drive or use heavy machinery while taking prescription pain medicine. Eating and drinking  Eat a healthy diet as told by your doctor. A healthy diet includes: ? Lots of fresh fruits and vegetables. ? Whole grains. ? Low-fat (lean) proteins.  Limit foods that are high in fat and processed sugars. These include fried and sweet foods.  Drink enough fluid to keep your pee (urine) clear or light yellow. General instructions  To prevent or treat trouble pooping while you are taking prescription pain medicine, your doctor may recommend that you: ? Take over-the-counter or prescription medicines. ? Eat foods that have a lot of fiber. These include beans, fresh fruits and vegetables, and whole grains.  Do not use any products that contain nicotine or tobacco. These include cigarettes and e-cigarettes. If you need help quitting, ask your doctor.  Avoid being where people are smoking (avoid secondhand smoke).  Wear compression stockings as told by your doctor. These stockings help you: ? Not get blood clots in your legs. ? Have less swelling in your legs.  If you have a chest tube, care for it as told by your doctor.  Do not travel by airplane during the 2 weeks after your chest tube is removed, or until your doctor says that this is safe.  Keep all follow-up visits as told by your doctor. This is important. Contact a doctor if:  You have redness, swelling, or pain around a cut from surgery.  You have fluid or blood coming from a cut from surgery.  Your cut(s) from surgery feel warm to the touch.  You have pus or a bad smell coming from a cut from surgery.  You have a fever or chills.  You feel sick to your stomach (nauseous).  You throw up (vomit).  You have pain that does not get better with medicine. Get help right away if:  You have chest  pain.  Your heart is fluttering or beating fast.  You start to have a rash.  You have shortness of breath.  You have trouble breathing.  You are confused.  You have trouble talking or understanding.  You feel weak, light-headed, or dizzy.  You faint. Summary  To help prevent lung infection (  pneumonia), take deep breaths or do breathing exercises as told by your doctor.  Cough often. This is important for clearing chest fluid (phlegm). You can make coughing hurt less if you hold a pillow to your chest or put your hands flat on the cut(s) when you cough (do splinting).  Do not drive until your doctor approves.  Every day, check your cut(s) for signs of infection. These signs can be redness, swelling, pain, fluid, blood, warmth, pus, or a bad smell.  Eat a healthy diet. This includes lots of fresh fruits and vegetables, whole grains, and low-fat (lean) proteins. This information is not intended to replace advice given to you by your health care provider. Make sure you discuss any questions you have with your health care provider. Document Released: 05/13/2012 Document Revised: 12/27/2015 Document Reviewed: 12/27/2015 Elsevier Interactive Patient Education  2017 [employer].

## 2016-07-15 ENCOUNTER — Inpatient Hospital Stay (HOSPITAL_COMMUNITY): Payer: Medicare Other

## 2016-07-15 NOTE — Progress Notes (Signed)
90 mls chest tube output charted from last mark on canister.  New mark made.  Will report to night RN.

## 2016-07-15 NOTE — Plan of Care (Signed)
Problem: Education: Goal: Knowledge of disease or condition will improve Outcome: Progressing Pt knowledgeable about redevelopment of pneumothorax and treatment. As well as development of SQ air after replacement of PCT.

## 2016-07-15 NOTE — Progress Notes (Addendum)
5 Days Post-Op Procedure(s) (LRB): VIDEO ASSISTED THORACOSCOPY (VATS)/WEDGE RESECTION (Right) THORACOTOMY (Right) Subjective: Feels better this AM No SOB. Thinks "swelling" has gone down  Objective: Vital signs in last 24 hours: Temp:  [97.7 F (36.5 C)-98 F (36.7 C)] 98 F (36.7 C) (06/15 2028) Pulse Rate:  [50-72] 51 (06/16 0600) Cardiac Rhythm: Sinus bradycardia (06/15 2200) Resp:  [8-22] 11 (06/16 0600) BP: (96-156)/(62-95) 131/72 (06/16 0600) SpO2:  [90 %-100 %] 100 % (06/16 0600)  Hemodynamic parameters for last 24 hours:    Intake/Output from previous day: 06/15 0701 - 06/16 0700 In: 720 [P.O.:720] Out: 210 [Chest Tube:210] Intake/Output this shift: No intake/output data recorded.  General appearance: alert, cooperative and no distress Neurologic: intact Heart: regular rate and rhythm Lungs: diminished breath sounds right base less SQ air in face although stil present below eyes. air leak persists  Lab Results: No results for input(s): WBC, HGB, HCT, PLT in the last 72 hours. BMET: No results for input(s): NA, K, CL, CO2, GLUCOSE, BUN, CREATININE, CALCIUM in the last 72 hours.  PT/INR: No results for input(s): LABPROT, INR in the last 72 hours. ABG    Component Value Date/Time   PHART 7.453 (H) 07/07/2016 1255   HCO3 26.2 07/07/2016 1255   TCO2 23 06/05/2014 1652   ACIDBASEDEF 2.0 06/04/2014 1851   O2SAT 96.2 07/07/2016 1255   CBG (last 3)  No results for input(s): GLUCAP in the last 72 hours.  Assessment/Plan: S/P Procedure(s) (LRB): VIDEO ASSISTED THORACOSCOPY (VATS)/WEDGE RESECTION (Right) THORACOTOMY (Right) -Prolonged air leak after wedge resection -stable overnight after increased SQ air yesterday- suspect tube was kinked at time -CXR stable. Hard to tell if there is a small pneumo due to SQ emphysema -keep CT to suction - transfer back to [facility]   LOS: 5 days    Melrose Nakayama 07/15/2016 Addendum  After transfer to [place], his  subq emphysema worsened again suddenly then stabilized- I suspect it was due to being off suction. The CT is functioning normally with an air leak on suction. I don't think a second tube is necessary yet but may be eventually  Remo Lipps C. Roxan Hockey, MD [facility] [phone]

## 2016-07-16 ENCOUNTER — Inpatient Hospital Stay (HOSPITAL_COMMUNITY): Payer: Medicare Other

## 2016-07-16 NOTE — Progress Notes (Addendum)
      [address]       [place] [address]             [phone]      6 Days Post-Op Procedure(s) (LRB): VIDEO ASSISTED THORACOSCOPY (VATS)/WEDGE RESECTION (Right) THORACOTOMY (Right)   Subjective:  Patient doing okay.  His pain is well controlled.  He feels the air is stable from yesterday.  Objective: Vital signs in last 24 hours: Temp:  [97.7 F (36.5 C)-98.2 F (36.8 C)] 98 F (36.7 C) (06/17 0535) Pulse Rate:  [66-71] 66 (06/17 0535) Cardiac Rhythm: Normal sinus rhythm (06/17 0700) Resp:  [18] 18 (06/17 0535) BP: (111-130)/(57-70) 122/67 (06/17 0535) SpO2:  [99 %-100 %] 100 % (06/17 0535)  Intake/Output from previous day: 06/16 0701 - 06/17 0700 In: -  Out: 150 [Chest Tube:150]  General appearance: alert, cooperative and no distress Heart: regular rate and rhythm Lungs: diminished breath sounds right base Abdomen: soft, non-tender; bowel sounds normal; no masses,  no organomegaly Extremities: extensive sub q air noted across entire torso, bilateral neck and face Wound: clean and dry, chest tube is not kinked and remains in place  Lab Results: No results for input(s): WBC, HGB, HCT, PLT in the last 72 hours. BMET: No results for input(s): NA, K, CL, CO2, GLUCOSE, BUN, CREATININE, CALCIUM in the last 72 hours.  PT/INR: No results for input(s): LABPROT, INR in the last 72 hours. ABG    Component Value Date/Time   PHART 7.453 (H) 07/07/2016 1255   HCO3 26.2 07/07/2016 1255   TCO2 23 06/05/2014 1652   ACIDBASEDEF 2.0 06/04/2014 1851   O2SAT 96.2 07/07/2016 1255   CBG (last 3)  No results for input(s): GLUCAP in the last 72 hours.  Assessment/Plan: S/P Procedure(s) (LRB): VIDEO ASSISTED THORACOSCOPY (VATS)/WEDGE RESECTION (Right) THORACOTOMY (Right)  1. Extensive sub q emphysema- upper torso bilaterally, neck, and face- chest tube remains in place, unkinked.... Intermittent + 1 air leak with cough 2. Pulm- CXR is stable, there is some  improvement in previous pneumothorax- leave in place on suction today 3. Pain control- stable 4. Dispo- patient stable, continue chest tube on suction today, repeat CXR in AM.... Sub q emphysema is stable   LOS: 6 days    BARRETT, ERIN 07/16/2016 Patient seen and examined, agree with above Still has an air leak, SQ air stable  Remo Lipps C. Roxan Hockey, MD [facility] [phone]

## 2016-07-17 ENCOUNTER — Inpatient Hospital Stay (HOSPITAL_COMMUNITY): Payer: Medicare Other

## 2016-07-17 ENCOUNTER — Encounter (HOSPITAL_COMMUNITY): Payer: Self-pay

## 2016-07-17 DIAGNOSIS — J9383 Other pneumothorax: Secondary | ICD-10-CM

## 2016-07-17 MED ORDER — POLYETHYLENE GLYCOL 3350 17 G PO PACK
17.0000 g | PACK | Freq: Every day | ORAL | Status: DC
  Administered 2016-07-17 – 2016-07-21 (×5): 17 g via ORAL
  Filled 2016-07-17 (×10): qty 1

## 2016-07-17 MED ORDER — ACETAMINOPHEN 325 MG PO TABS
650.0000 mg | ORAL_TABLET | Freq: Four times a day (QID) | ORAL | Status: DC | PRN
  Administered 2016-07-17 – 2016-07-24 (×6): 650 mg via ORAL
  Filled 2016-07-17 (×6): qty 2

## 2016-07-17 MED ORDER — ONDANSETRON HCL 4 MG/2ML IJ SOLN: 4.0000 mg | Freq: Four times a day (QID) | INTRAMUSCULAR | Status: DC | PRN

## 2016-07-17 MED ORDER — LIDOCAINE HCL (PF) 1 % IJ SOLN
INTRAMUSCULAR | Status: AC
  Filled 2016-07-17: qty 5

## 2016-07-17 NOTE — Care Management Important Message (Signed)
Important Message  Patient Details  Name: Kelly Olson. MRN: [id number] Date of Birth: [birth year]   Medicare Important Message Given:  Yes    Doron Shake Abena 07/17/2016, 10:55 AM

## 2016-07-17 NOTE — Procedures (Signed)
Discussed the option of trying to place an anterior tube to help with the worsening subcutaneous emphysema with Mr. and Mrs. Kingdon. I discussed the risks of the tube placement as well as the potential benefit. They understand the benefit is uncertain. He agrees to proceed.  Sterile technique was used. 10 mL of lidocaine was used for local anesthesia. A Wayne pigtail catheter was placed using a modified Seldinger technique. There was an air leak with catheter placement. No complications apparent. Tolerated well.  Revonda Standard Roxan Hockey, MD [facility] [phone]

## 2016-07-17 NOTE — Progress Notes (Signed)
      [address]       [place] [address]             [phone]      I was paged in the OR that Mr. Cavanagh was having more issues swallowing and breathing because of his subQ emphysema. I discussed the case with Dr. Roxan Hockey and he informed the nurse to get a wayne hemothorax set and a new Pleura vac. He plans to place another chest tube. The patient was made aware. He is tolerating 3L [place] for oxygen support. He can swallow but he feels more resistance when doing so. He also shares his breathing is slightly more restricted and that "his face feels like its ready to pop". Nursing is working on gathering the supplies for chest tube placement.    Nicholes Rough, PA-C

## 2016-07-17 NOTE — Progress Notes (Addendum)
      [address]       [place] [address]             [phone]      7 Days Post-Op Procedure(s) (LRB): VIDEO ASSISTED THORACOSCOPY (VATS)/WEDGE RESECTION (Right) THORACOTOMY (Right) Subjective: Nausea this morning. No other issues.   Objective: Vital signs in last 24 hours: Temp:  [98.5 F (36.9 C)-99 F (37.2 C)] 98.8 F (37.1 C) (06/18 0439) Pulse Rate:  [65-72] 65 (06/18 0439) Cardiac Rhythm: Normal sinus rhythm (06/17 2107) Resp:  [18-20] 18 (06/18 0439) BP: (112-131)/(66-73) 119/73 (06/18 0439) SpO2:  [98 %-99 %] 98 % (06/18 0439)    Intake/Output from previous day: 06/17 0701 - 06/18 0700 In: 600 [P.O.:600] Out: 40 [Chest Tube:40] Intake/Output this shift: No intake/output data recorded.  General appearance: alert, cooperative and no distress, face swollen Heart: regular rate and rhythm, S1, S2 normal, no murmur, click, rub or gallop Lungs: clear to auscultation bilaterally Abdomen: soft, non-tender; bowel sounds normal; no masses,  no organomegaly Extremities: extremities normal, atraumatic, no cyanosis or edema Wound: clean and dry  Lab Results: No results for input(s): WBC, HGB, HCT, PLT in the last 72 hours. BMET: No results for input(s): NA, K, CL, CO2, GLUCOSE, BUN, CREATININE, CALCIUM in the last 72 hours.  PT/INR: No results for input(s): LABPROT, INR in the last 72 hours. ABG    Component Value Date/Time   PHART 7.453 (H) 07/07/2016 1255   HCO3 26.2 07/07/2016 1255   TCO2 23 06/05/2014 1652   ACIDBASEDEF 2.0 06/04/2014 1851   O2SAT 96.2 07/07/2016 1255   CBG (last 3)  No results for input(s): GLUCAP in the last 72 hours.  Assessment/Plan: S/P Procedure(s) (LRB): VIDEO ASSISTED THORACOSCOPY (VATS)/WEDGE RESECTION (Right) THORACOTOMY (Right)  1. CXR this morning shows persistent and unchanged right-sided pneumothorax with right chest tube in place. Similar degree of extensive subQ emphysema.  2. Pain control-stable 3.  Nausea-start zofran and miralax 3. Plan:  Chest tube with intermittent air leak. Continue on suction. Encouraged incentive spirometry.    LOS: 7 days    Elgie Collard 07/17/2016

## 2016-07-18 ENCOUNTER — Inpatient Hospital Stay (HOSPITAL_COMMUNITY): Payer: Medicare Other

## 2016-07-18 NOTE — Progress Notes (Signed)
      [address]       [place] [address]             [phone]      8 Days Post-Op Procedure(s) (LRB): VIDEO ASSISTED THORACOSCOPY (VATS)/WEDGE RESECTION (Right) THORACOTOMY (Right) Subjective: Feels better than yesterday  Objective: Vital signs in last 24 hours: Temp:  [97.6 F (36.4 C)-98.3 F (36.8 C)] 97.6 F (36.4 C) (06/19 0512) Pulse Rate:  [57-77] 66 (06/19 0512) Cardiac Rhythm: Normal sinus rhythm (06/18 1933) Resp:  [18] 18 (06/19 0512) BP: (106-159)/(61-86) 110/68 (06/19 0512) SpO2:  [91 %-100 %] 91 % (06/19 0512) Weight:  [69 kg (152 lb 1.6 oz)] 69 kg (152 lb 1.6 oz) (06/19 0512)     Intake/Output from previous day: 06/18 0701 - 06/19 0700 In: 480 [P.O.:480] Out: 84 [Chest Tube:84] Intake/Output this shift: No intake/output data recorded.  General appearance: alert, cooperative and no distress Heart: regular rate and rhythm, S1, S2 normal, no murmur, click, rub or gallop Lungs: crackles bilaterally Abdomen: soft, non-tender; bowel sounds normal; no masses,  no organomegaly Extremities: extremities normal, atraumatic, no cyanosis or edema Wound: chest tube in good position  Lab Results: No results for input(s): WBC, HGB, HCT, PLT in the last 72 hours. BMET: No results for input(s): NA, K, CL, CO2, GLUCOSE, BUN, CREATININE, CALCIUM in the last 72 hours.  PT/INR: No results for input(s): LABPROT, INR in the last 72 hours. ABG    Component Value Date/Time   PHART 7.453 (H) 07/07/2016 1255   HCO3 26.2 07/07/2016 1255   TCO2 23 06/05/2014 1652   ACIDBASEDEF 2.0 06/04/2014 1851   O2SAT 96.2 07/07/2016 1255   CBG (last 3)  No results for input(s): GLUCAP in the last 72 hours.  Assessment/Plan: S/P Procedure(s) (LRB): VIDEO ASSISTED THORACOSCOPY (VATS)/WEDGE RESECTION (Right) THORACOTOMY (Right)  1. CV-NSR, stable BP 2. SubQ emphysema- improved with additional chest tube placement yesterday 3. CXR showed: Right chest tubes in  stable position. Persistent right-sided pneumothorax. Diffuse subcutaneous emphysema again noted.  Plan: Keep chest tubes to suction.    LOS: 8 days    Elgie Collard 07/18/2016

## 2016-07-18 NOTE — Progress Notes (Signed)
8 Days Post-Op Procedure(s) (LRB): VIDEO ASSISTED THORACOSCOPY (VATS)/WEDGE RESECTION (Right) THORACOTOMY (Right) Subjective: Feels better this AM SQ air has decreased  Objective: Vital signs in last 24 hours: Temp:  [97.6 F (36.4 C)-98.3 F (36.8 C)] 97.6 F (36.4 C) (06/19 0512) Pulse Rate:  [57-71] 66 (06/19 0512) Cardiac Rhythm: Normal sinus rhythm (06/18 1933) Resp:  [18] 18 (06/19 0512) BP: (106-127)/(61-73) 110/68 (06/19 0512) SpO2:  [91 %-100 %] 91 % (06/19 0512) Weight:  [152 lb 1.6 oz (69 kg)] 152 lb 1.6 oz (69 kg) (06/19 0512)  Hemodynamic parameters for last 24 hours:    Intake/Output from previous day: 06/18 0701 - 06/19 0700 In: 480 [P.O.:480] Out: 84 [Chest Tube:84] Intake/Output this shift: Total I/O In: 240 [P.O.:240] Out: -   General appearance: alert, cooperative and no distress Neurologic: intact Heart: regular rate and rhythm Lungs: faint rhonchi R>l no air leak   Lab Results: No results for input(s): WBC, HGB, HCT, PLT in the last 72 hours. BMET: No results for input(s): NA, K, CL, CO2, GLUCOSE, BUN, CREATININE, CALCIUM in the last 72 hours.  PT/INR: No results for input(s): LABPROT, INR in the last 72 hours. ABG    Component Value Date/Time   PHART 7.453 (H) 07/07/2016 1255   HCO3 26.2 07/07/2016 1255   TCO2 23 06/05/2014 1652   ACIDBASEDEF 2.0 06/04/2014 1851   O2SAT 96.2 07/07/2016 1255   CBG (last 3)  No results for input(s): GLUCAP in the last 72 hours.  Assessment/Plan: S/P Procedure(s) (LRB): VIDEO ASSISTED THORACOSCOPY (VATS)/WEDGE RESECTION (Right) THORACOTOMY (Right) -  Persistent air leak after wedge resection with pneumothorax and severe SQ emphysema.  His SQ emphysema is markedly improved today after placement of the second tube yesterday afternoon. There is no air leak at present but will keep tubes on suction for at least another 24 hours CXR shows decrease/ ? Resolution of basilar pneumo,  Still has a small apical  pneumo   LOS: 8 days    Melrose Nakayama 07/18/2016

## 2016-07-19 ENCOUNTER — Encounter (HOSPITAL_COMMUNITY): Payer: Self-pay

## 2016-07-19 ENCOUNTER — Inpatient Hospital Stay (HOSPITAL_COMMUNITY): Payer: Medicare Other

## 2016-07-19 NOTE — Progress Notes (Addendum)
      [address]       [place] [address]             [phone]      9 Days Post-Op Procedure(s) (LRB): VIDEO ASSISTED THORACOSCOPY (VATS)/WEDGE RESECTION (Right) THORACOTOMY (Right) Subjective: No issues. He shares that he feels better each day.   Objective: Vital signs in last 24 hours: Temp:  [97.9 F (36.6 C)-98 F (36.7 C)] 98 F (36.7 C) (06/20 0300) Pulse Rate:  [61-65] 61 (06/20 0300) Cardiac Rhythm: Normal sinus rhythm (06/20 0700) Resp:  [18] 18 (06/20 0300) BP: (110-125)/(53-69) 125/64 (06/20 0300) SpO2:  [99 %-100 %] 100 % (06/20 0300)    Intake/Output from previous day: 06/19 0701 - 06/20 0700 In: 840 [P.O.:840] Out: 135 [Chest Tube:135] Intake/Output this shift: No intake/output data recorded.  General appearance: alert, cooperative and no distress Heart: regular rate and rhythm, S1, S2 normal, no murmur, click, rub or gallop Lungs: crackles bilaterally Abdomen: soft, non-tender; bowel sounds normal; no masses,  no organomegaly Extremities: extremities normal, atraumatic, no cyanosis or edema Wound: clean and dry  Lab Results: No results for input(s): WBC, HGB, HCT, PLT in the last 72 hours. BMET: No results for input(s): NA, K, CL, CO2, GLUCOSE, BUN, CREATININE, CALCIUM in the last 72 hours.  PT/INR: No results for input(s): LABPROT, INR in the last 72 hours. ABG    Component Value Date/Time   PHART 7.453 (H) 07/07/2016 1255   HCO3 26.2 07/07/2016 1255   TCO2 23 06/05/2014 1652   ACIDBASEDEF 2.0 06/04/2014 1851   O2SAT 96.2 07/07/2016 1255   CBG (last 3)  No results for input(s): GLUCAP in the last 72 hours.  Assessment/Plan: S/P Procedure(s) (LRB): VIDEO ASSISTED THORACOSCOPY (VATS)/WEDGE RESECTION (Right) THORACOTOMY (Right)  1. CV-NSR, stable BP 2. SubQ emphysema- improved with additional chest tube placement yesterday. Encourage incentive spirometry.  3. CXR yesterday showed: Right chest tubes in stable position.  Persistent right-sided pneumothorax. Diffuse subcutaneous emphysema again noted. Slight air leak with cough.   Plan: I ordered a chest xray for today since he didn't have one this morning. SubQ emphysema continues to improve. Continue to suction. Ambulate around the room. Wean oxygen as tolerated.     LOS: 9 days    Elgie Collard 07/19/2016 Patient seen and examined, agree with above His SQ emphysema is markedly improved He does still have a very small air leak- will keep on suction at least for today, consider trial of water seal tomorrow  Remo Lipps C. Roxan Hockey, MD [facility] [phone]

## 2016-07-19 NOTE — Care Management Important Message (Signed)
Important Message  Patient Details  Name: Kelly Olson. MRN: [id number] Date of Birth: 1950/05/15   Medicare Important Message Given:  Yes    Naijah Lacek Abena 07/19/2016, 10:07 AM

## 2016-07-20 ENCOUNTER — Inpatient Hospital Stay (HOSPITAL_COMMUNITY): Payer: Medicare Other

## 2016-07-20 ENCOUNTER — Other Ambulatory Visit: Payer: Self-pay | Admitting: *Deleted

## 2016-07-20 NOTE — Progress Notes (Addendum)
      [address]       [place] [address]             [phone]      10 Days Post-Op Procedure(s) (LRB): VIDEO ASSISTED THORACOSCOPY (VATS)/WEDGE RESECTION (Right) THORACOTOMY (Right) Subjective: Feels good this morning.   Objective: Vital signs in last 24 hours: Temp:  [97.7 F (36.5 C)-98.6 F (37 C)] 98.6 F (37 C) (06/21 0414) Pulse Rate:  [66-72] 70 (06/21 0414) Cardiac Rhythm: Normal sinus rhythm (06/20 1900) Resp:  [18] 18 (06/20 2033) BP: (114-122)/(57-67) 120/66 (06/21 0414) SpO2:  [91 %-100 %] 91 % (06/21 0414)    Intake/Output from previous day: 06/20 0701 - 06/21 0700 In: 480 [P.O.:480] Out: 130 [Chest Tube:130] Intake/Output this shift: No intake/output data recorded.  General appearance: alert, cooperative and no distress Heart: regular rate and rhythm, S1, S2 normal, no murmur, click, rub or gallop Lungs: clear to auscultation bilaterally Abdomen: soft, non-tender; bowel sounds normal; no masses,  no organomegaly Extremities: extremities normal, atraumatic, no cyanosis or edema Wound: clean and dry  Lab Results: No results for input(s): WBC, HGB, HCT, PLT in the last 72 hours. BMET: No results for input(s): NA, K, CL, CO2, GLUCOSE, BUN, CREATININE, CALCIUM in the last 72 hours.  PT/INR: No results for input(s): LABPROT, INR in the last 72 hours. ABG    Component Value Date/Time   PHART 7.453 (H) 07/07/2016 1255   HCO3 26.2 07/07/2016 1255   TCO2 23 06/05/2014 1652   ACIDBASEDEF 2.0 06/04/2014 1851   O2SAT 96.2 07/07/2016 1255   CBG (last 3)  No results for input(s): GLUCAP in the last 72 hours.  Assessment/Plan: S/P Procedure(s) (LRB): VIDEO ASSISTED THORACOSCOPY (VATS)/WEDGE RESECTION (Right) THORACOTOMY (Right)  1. NSR, stable BP 2. SubQ emphysema- improved with additional chest tube placement. Encourage incentive spirometry. May trial water seal today.  3. CXR yesterday showed: Extensive subcutaneous emphysema.  Stable chest tube positioning with persistent apical pneumothorax on the right without tension component, stable. Focal consolidation medial right base with bibasilar atelectasis. No new lung opacity. Stable cardiac silhouette. There is aortic atherosclerosis.  Plan:Await this mornings chest xray. SubQ emphysema continues to improve. Continue to suction, may trial water seal today. Ambulate around the room. Wean oxygen as tolerated.    LOS: 10 days    Kelly Olson 07/20/2016 Patient seen and examined, agree with above His SQ emphysema continues to improve Unfortunately his air leak is larger today than yesterday (but not as large as earlier in the week) Keep CT to suction  Kelly Lipps C. Roxan Hockey, MD [facility] [phone]

## 2016-07-21 ENCOUNTER — Inpatient Hospital Stay (HOSPITAL_COMMUNITY): Payer: Medicare Other

## 2016-07-21 ENCOUNTER — Encounter (HOSPITAL_COMMUNITY): Payer: Self-pay

## 2016-07-21 NOTE — Progress Notes (Signed)
      [address]       [place] [address]             [phone]      11 Days Post-Op Procedure(s) (LRB): VIDEO ASSISTED THORACOSCOPY (VATS)/WEDGE RESECTION (Right) THORACOTOMY (Right) Subjective: Feels good this morning.   Objective: Vital signs in last 24 hours: Temp:  [98.1 F (36.7 C)-98.7 F (37.1 C)] 98.1 F (36.7 C) (06/22 0453) Pulse Rate:  [63-78] 63 (06/22 0453) Cardiac Rhythm: Normal sinus rhythm (06/22 0700) Resp:  [18] 18 (06/22 0453) BP: (119-127)/(62-83) 127/83 (06/22 0453) SpO2:  [93 %-94 %] 93 % (06/22 0453)    Intake/Output from previous day: 06/21 0701 - 06/22 0700 In: 960 [P.O.:960] Out: 100 [Chest Tube:100] Intake/Output this shift: No intake/output data recorded.  General appearance: alert, cooperative and no distress Heart: regular rate and rhythm, S1, S2 normal, no murmur, click, rub or gallop Lungs: crackles in the upper fields Abdomen: soft, non-tender; bowel sounds normal; no masses,  no organomegaly Wound: clean and dry  Lab Results: No results for input(s): WBC, HGB, HCT, PLT in the last 72 hours. BMET: No results for input(s): NA, K, CL, CO2, GLUCOSE, BUN, CREATININE, CALCIUM in the last 72 hours.  PT/INR: No results for input(s): LABPROT, INR in the last 72 hours. ABG    Component Value Date/Time   PHART 7.453 (H) 07/07/2016 1255   HCO3 26.2 07/07/2016 1255   TCO2 23 06/05/2014 1652   ACIDBASEDEF 2.0 06/04/2014 1851   O2SAT 96.2 07/07/2016 1255   CBG (last 3)  No results for input(s): GLUCAP in the last 72 hours.  Assessment/Plan: S/P Procedure(s) (LRB): VIDEO ASSISTED THORACOSCOPY (VATS)/WEDGE RESECTION (Right) THORACOTOMY (Right)  1. NSR rate in the 60s, BP stable 2. SubQ emphysema-improved with additional chest tube placement. CXR yesterday showed: Continued extensive subcutaneous emphysema. Difficult to visualize a residual right pneumothorax.  Air leak remains. Await morning CXR.  3. Tolerating room  air. Continue incentive spirometry.  Plan: Continue chest tube. Air leak remains. Await morning chest xray.    LOS: 11 days    Elgie Collard 07/21/2016

## 2016-07-22 ENCOUNTER — Other Ambulatory Visit: Payer: Self-pay | Admitting: Cardiology

## 2016-07-22 MED ORDER — ZINC SULFATE 220 (50 ZN) MG PO CAPS
220.0000 mg | ORAL_CAPSULE | Freq: Every day | ORAL | Status: DC
  Administered 2016-07-22 – 2016-07-26 (×5): 220 mg via ORAL
  Filled 2016-07-22 (×5): qty 1

## 2016-07-22 NOTE — Progress Notes (Addendum)
[address]       [place] [address]             [phone]      12 Days Post-Op Procedure(s) (LRB): VIDEO ASSISTED THORACOSCOPY (VATS)/WEDGE RESECTION (Right) THORACOTOMY (Right) Subjective: In excellent spirits considering prolonged hospitalization. Conts with + air leak  Objective: Vital signs in last 24 hours: Temp:  [98.3 F (36.8 C)-98.4 F (36.9 C)] 98.3 F (36.8 C) (06/23 0618) Pulse Rate:  [63-72] 70 (06/23 0909) Cardiac Rhythm: Normal sinus rhythm (06/22 1921) Resp:  [16-18] 16 (06/23 0618) BP: (109-125)/(50-68) 125/68 (06/23 0909) SpO2:  [94 %-96 %] 96 % (06/23 0618) Weight:  [149 lb 14.4 oz (68 kg)] 149 lb 14.4 oz (68 kg) (06/23 0618)  Hemodynamic parameters for last 24 hours:    Intake/Output from previous day: 06/22 0701 - 06/23 0700 In: 600 [P.O.:600] Out: 90 [Chest Tube:90] Intake/Output this shift: No intake/output data recorded.  General appearance: alert, cooperative and no distress Heart: regular rate and rhythm Lungs: + SQ air, crackles on right Abdomen: benign Extremities: no edema Wound: incis healing well  Lab Results: No results for input(s): WBC, HGB, HCT, PLT in the last 72 hours. BMET: No results for input(s): NA, K, CL, CO2, GLUCOSE, BUN, CREATININE, CALCIUM in the last 72 hours.  PT/INR: No results for input(s): LABPROT, INR in the last 72 hours. ABG    Component Value Date/Time   PHART 7.453 (H) 07/07/2016 1255   HCO3 26.2 07/07/2016 1255   TCO2 23 06/05/2014 1652   ACIDBASEDEF 2.0 06/04/2014 1851   O2SAT 96.2 07/07/2016 1255   CBG (last 3)  No results for input(s): GLUCAP in the last 72 hours.  Meds Scheduled Meds: . aspirin EC  81 mg Oral Daily  . atorvastatin  40 mg Oral q1800  . carvedilol  6.25 mg Oral BID  . cholecalciferol  1,000 Units Oral Daily  . enoxaparin (LOVENOX) injection  40 mg Subcutaneous Daily  . losartan  25 mg Oral Daily  . polyethylene glycol  17 g Oral Daily  . sodium  chloride flush  3 mL Intravenous Q12H   Continuous Infusions: PRN Meds:.acetaminophen, menthol-cetylpyridinium, ondansetron (ZOFRAN) IV, oxyCODONE, phenol, sodium chloride flush, traMADol  Xrays Dg Chest Port 1 View  Result Date: 07/21/2016 CLINICAL DATA:  Shortness of breath with chest tubes present EXAM: PORTABLE CHEST 1 VIEW COMPARISON:  Chest radiographs July 20, 2016 July 19, 2016 FINDINGS: Chest tube positions on the right are stable. The previously noted right-sided pneumothorax appears unchanged; with overlying subcutaneous emphysematous change. The margins of this pneumothorax are rather difficult to visualize. No tension component. Extensive subcutaneous emphysema remains. There is patchy bibasilar atelectasis. There is no evident new opacity. Heart size and pulmonary vascularity are normal. No adenopathy. Patient is status post coronary artery bypass grafting. No adenopathy. No bone lesions. There is aortic atherosclerosis. IMPRESSION: Chest tubes unchanged in positions on the right with stable pneumothorax. No tension component. Extensive subcutaneous emphysema bilaterally. Bibasilar atelectasis. No new opacity. Stable cardiac silhouette. There is aortic atherosclerosis. Electronically Signed   By: Lowella Grip III M.D.   On: 07/21/2016 08:12   Dg Chest Port 1 View  Result Date: 07/20/2016 CLINICAL DATA:  Chest tube, pneumothorax, shortness of Breath EXAM: PORTABLE CHEST 1 VIEW COMPARISON:  07/19/2016 FINDINGS: Continued extensive subcutaneous emphysema. Very difficult to visualize a pneumothorax given the extensive overlying subcutaneous emphysema. Two right chest tubes remain in place, unchanged. Heart is normal size.  Prior median sternotomy. No visible confluent airspace opacity. IMPRESSION: Continued extensive subcutaneous emphysema. Difficult to visualize a residual right pneumothorax. Electronically Signed   By: Rolm Baptise M.D.   On: 07/20/2016 09:42    Assessment/Plan: S/P  Procedure(s) (LRB): VIDEO ASSISTED THORACOSCOPY (VATS)/WEDGE RESECTION (Right) THORACOTOMY (Right)   1 clinically stable with air leak, SQ air slowly improving- cont current management, may need bronchial valves   LOS: 12 days    GOLD,WAYNE E 07/22/2016  reduce pleurovac suction when subQ air is improved patient examined and medical record reviewed,agree with above note. Tharon Aquas Trigt III 07/22/2016

## 2016-07-23 ENCOUNTER — Inpatient Hospital Stay (HOSPITAL_COMMUNITY): Payer: Medicare Other

## 2016-07-23 NOTE — Progress Notes (Addendum)
      [address]       [place] [address]             [phone]      13 Days Post-Op Procedure(s) (LRB): VIDEO ASSISTED THORACOSCOPY (VATS)/WEDGE RESECTION (Right) THORACOTOMY (Right) Subjective: Feels well, small air leak, less sub q air  Objective: Vital signs in last 24 hours: Temp:  [97.6 F (36.4 C)-98.1 F (36.7 C)] 97.6 F (36.4 C) (06/24 0500) Pulse Rate:  [60-70] 60 (06/24 0500) Cardiac Rhythm: Normal sinus rhythm;Bundle branch block (06/23 1900) Resp:  [17-20] 20 (06/24 0500) BP: (94-125)/(61-68) 119/63 (06/24 0500) SpO2:  [94 %-96 %] 94 % (06/24 0500)  Hemodynamic parameters for last 24 hours:    Intake/Output from previous day: 06/23 0701 - 06/24 0700 In: 720 [P.O.:720] Out: 120 [Chest Tube:120] Intake/Output this shift: No intake/output data recorded.  General appearance: alert, cooperative and no distress Heart: regular rate and rhythm Lungs: clear to auscultation bilaterally Abdomen: benign Extremities: no edema Wound: incis healing well  Lab Results: No results for input(s): WBC, HGB, HCT, PLT in the last 72 hours. BMET: No results for input(s): NA, K, CL, CO2, GLUCOSE, BUN, CREATININE, CALCIUM in the last 72 hours.  PT/INR: No results for input(s): LABPROT, INR in the last 72 hours. ABG    Component Value Date/Time   PHART 7.453 (H) 07/07/2016 1255   HCO3 26.2 07/07/2016 1255   TCO2 23 06/05/2014 1652   ACIDBASEDEF 2.0 06/04/2014 1851   O2SAT 96.2 07/07/2016 1255   CBG (last 3)  No results for input(s): GLUCAP in the last 72 hours.  Meds Scheduled Meds: . aspirin EC  81 mg Oral Daily  . atorvastatin  40 mg Oral q1800  . carvedilol  6.25 mg Oral BID  . cholecalciferol  1,000 Units Oral Daily  . enoxaparin (LOVENOX) injection  40 mg Subcutaneous Daily  . losartan  25 mg Oral Daily  . polyethylene glycol  17 g Oral Daily  . sodium chloride flush  3 mL Intravenous Q12H  . zinc sulfate  220 mg Oral Daily    Continuous Infusions: PRN Meds:.acetaminophen, menthol-cetylpyridinium, ondansetron (ZOFRAN) IV, oxyCODONE, phenol, sodium chloride flush, traMADol  Xrays Dg Chest Port 1 View  Result Date: 07/23/2016 CLINICAL DATA:  Would resection right lower lobe lung nodule. Two right-sided chest tubes. EXAM: PORTABLE CHEST 1 VIEW COMPARISON:  07/21/2016 FINDINGS: Two right-sided chest tubes unchanged. Lungs are adequately inflated with persistent opacification in the right base unchanged. Subtle blunting of the right costophrenic angle unchanged. Multiple surgical sutures over the right lung base. Subtle stable left base opacification. No definite pneumothorax visualized. Cardiomediastinal silhouette and remainder of the exam is unchanged to include moderate subcutaneous emphysema throughout the thorax. IMPRESSION: Stable postsurgical change an opacification within the right base. 2 right-sided chest tubes unchanged. No definite pneumothorax visualized. Stable minimal left base opacification. Stable subcutaneous emphysema throughout the thorax. Electronically Signed   By: Marin Olp M.D.   On: 07/23/2016 08:25    Assessment/Plan: S/P Procedure(s) (LRB): VIDEO ASSISTED THORACOSCOPY (VATS)/WEDGE RESECTION (Right) THORACOTOMY (Right)  1 stable overall, will reduce suction to 20cm H2O as sub q air conts to improve, air leak is small   LOS: 13 days    GOLD,WAYNE E 07/23/2016 Agree with plan to reduce suction cxr in am patient examined and medical record reviewed,agree with above note. Tharon Aquas Trigt III 07/23/2016

## 2016-07-24 ENCOUNTER — Encounter (HOSPITAL_COMMUNITY): Payer: Self-pay

## 2016-07-24 ENCOUNTER — Inpatient Hospital Stay (HOSPITAL_COMMUNITY): Payer: Medicare Other

## 2016-07-24 NOTE — Progress Notes (Signed)
14 Days Post-Op Procedure(s) (LRB): VIDEO ASSISTED THORACOSCOPY (VATS)/WEDGE RESECTION (Right) THORACOTOMY (Right) Subjective:  No complaints  Objective: Vital signs in last 24 hours: Temp:  [98 F (36.7 C)-98.6 F (37 C)] 98.1 F (36.7 C) (06/25 0530) Pulse Rate:  [63-67] 67 (06/25 0530) Cardiac Rhythm: Normal sinus rhythm (06/24 1900) Resp:  [18-21] 18 (06/25 0530) BP: (95-125)/(53-72) 114/72 (06/25 0530) SpO2:  [95 %-97 %] 95 % (06/25 0530)  Hemodynamic parameters for last 24 hours:    Intake/Output from previous day: 06/24 0701 - 06/25 0700 In: 720 [P.O.:720] Out: 100 [Chest Tube:100] Intake/Output this shift: No intake/output data recorded.  General appearance: alert and cooperative. Subcutaneous air resolving. Voice still a little squeaky. Heart: regular rate and rhythm, S1, S2 normal, no murmur, click, rub or gallop Lungs: clear to auscultation bilaterally Wound: incisions ok chest tubes have no air leak and no tidaling.  Lab Results: No results for input(s): WBC, HGB, HCT, PLT in the last 72 hours. BMET: No results for input(s): NA, K, CL, CO2, GLUCOSE, BUN, CREATININE, CALCIUM in the last 72 hours.  PT/INR: No results for input(s): LABPROT, INR in the last 72 hours. ABG    Component Value Date/Time   PHART 7.453 (H) 07/07/2016 1255   HCO3 26.2 07/07/2016 1255   TCO2 23 06/05/2014 1652   ACIDBASEDEF 2.0 06/04/2014 1851   O2SAT 96.2 07/07/2016 1255   CBG (last 3)  No results for input(s): GLUCAP in the last 72 hours.  CXR: stable. I don't see any ptx. Subcutaneous air resolving  Assessment/Plan: S/P Procedure(s) (LRB): VIDEO ASSISTED THORACOSCOPY (VATS)/WEDGE RESECTION (Right) THORACOTOMY (Right)  Delayed postop air leak with massive subcutaneous emphysema. Treated with 2 chest tubes. Air leak now appears resolved and subcutaneous emphysema almost resolved. Will put chest tubes to water seal and check 2V CXR in am. Plan to remove larger inferior chest  tube if everything remains stable tomorrow morning. Continue IS, ambulation.   LOS: 14 days    Gaye Pollack 07/24/2016

## 2016-07-24 NOTE — Care Management Important Message (Signed)
Important Message  Patient Details  Name: Kelly Olson. MRN: [id number] Date of Birth: 1950/05/21   Medicare Important Message Given:  Yes    Axcel Horsch Abena 07/24/2016, 10:59 AM

## 2016-07-24 NOTE — Care Management Note (Signed)
Case Management Note  Patient Details  Name: Kelly Olson. MRN: [id number] Date of Birth: December 21, 1950  Subjective/Objective:                    Action/Plan: Plan continues to be for home when patient is medically ready. CM following for d/c needs, physician orders.  Expected Discharge Date:                  Expected Discharge Plan:  Home/Self Care  In-House Referral:     Discharge planning Services  CM Consult  Post Acute Care Choice:    Choice offered to:     DME Arranged:    DME Agency:     HH Arranged:    HH Agency:     Status of Service:  In process, will continue to follow  If discussed at Long Length of Stay Meetings, dates discussed:    Additional Comments:  Pollie Friar, RN 07/24/2016, 1:08 PM

## 2016-07-25 ENCOUNTER — Inpatient Hospital Stay (HOSPITAL_COMMUNITY): Payer: Medicare Other

## 2016-07-25 NOTE — Progress Notes (Addendum)
      [address]       [place] [address]             [phone]      15 Days Post-Op Procedure(s) (LRB): VIDEO ASSISTED THORACOSCOPY (VATS)/WEDGE RESECTION (Right) THORACOTOMY (Right) Subjective: No issues this morning. Excited that he may get one chest tube removed today.   Objective: Vital signs in last 24 hours: Temp:  [97.8 F (36.6 C)-98.6 F (37 C)] 97.8 F (36.6 C) (06/26 0501) Pulse Rate:  [54-62] 54 (06/26 0501) Cardiac Rhythm: Sinus bradycardia (06/26 0719) Resp:  [18-19] 18 (06/26 0501) BP: (114-127)/(60-69) 118/60 (06/26 0501) SpO2:  [96 %-98 %] 96 % (06/26 0501)     Intake/Output from previous day: 06/25 0701 - 06/26 0700 In: 720 [P.O.:720] Out: 300 [Urine:300] Intake/Output this shift: No intake/output data recorded.  General appearance: alert, cooperative and no distress Heart: sinus bradycardia Lungs: clear to auscultation bilaterally Abdomen: soft, non-tender; bowel sounds normal; no masses,  no organomegaly Extremities: extremities normal, atraumatic, no cyanosis or edema Wound: clean and dry, secure chest tubes  Lab Results: No results for input(s): WBC, HGB, HCT, PLT in the last 72 hours. BMET: No results for input(s): NA, K, CL, CO2, GLUCOSE, BUN, CREATININE, CALCIUM in the last 72 hours.  PT/INR: No results for input(s): LABPROT, INR in the last 72 hours. ABG    Component Value Date/Time   PHART 7.453 (H) 07/07/2016 1255   HCO3 26.2 07/07/2016 1255   TCO2 23 06/05/2014 1652   ACIDBASEDEF 2.0 06/04/2014 1851   O2SAT 96.2 07/07/2016 1255   CBG (last 3)  No results for input(s): GLUCAP in the last 72 hours.  Assessment/Plan: S/P Procedure(s) (LRB): VIDEO ASSISTED THORACOSCOPY (VATS)/WEDGE RESECTION (Right) THORACOTOMY (Right)  1. CV-sinus bradycardia, BP stable 2. Pulm-two chest tubes in place for subQ emphysema. Currently on water seal. CXR showed yesterday stable subQ emphysema. No pneumothorax, right chest  tubes in place. Await CXR this morning. No air leak.   Plan: Remove larger inferior chest tube this morning if CXR remains stable. Continue pigtail chest tube.    LOS: 15 days    Elgie Collard 07/25/2016   Chart reviewed, patient examined, agree with above. CXR stable and no air leak so will remove inferior chest tube.

## 2016-07-25 NOTE — Progress Notes (Signed)
      [address]       [place] [address]             [phone]    Procedure: Right chest tube removal   The patients chest tube did not have an air leak and the chest xray from this morning was stable. I removed Mr. Bady right large bore inferior chest tube without complication. The two sutures on either side were tightened and tied. I placed a Vaseline dressing over the incision and multiple 4 x 4 gauze pads secured with tape. The pigtail catheter was clamped during the chest tube removal. I attached the pigtail to the atrium and unclamped the tube. The patient tolerated the procedure well without complication. We will obtain another chest xray in the morning.   Procedure time: 10 minutes Complications: none  Nicholes Rough, PA-C

## 2016-07-26 ENCOUNTER — Encounter (HOSPITAL_COMMUNITY): Payer: Self-pay

## 2016-07-26 ENCOUNTER — Inpatient Hospital Stay (HOSPITAL_COMMUNITY): Payer: Medicare Other

## 2016-07-26 MED ORDER — OXYCODONE HCL 5 MG PO TABS: 5.0000 mg | ORAL_TABLET | Freq: Two times a day (BID) | ORAL | 0 refills | Status: DC | PRN

## 2016-07-26 MED ORDER — LOSARTAN POTASSIUM 25 MG PO TABS: 25.0000 mg | ORAL_TABLET | Freq: Every day | ORAL | 1 refills | Status: DC

## 2016-07-26 MED ORDER — ZINC SULFATE 220 (50 ZN) MG PO CAPS
220.0000 mg | ORAL_CAPSULE | Freq: Every day | ORAL | 1 refills | Status: DC
Start: 2016-07-26 — End: 2016-09-27

## 2016-07-26 NOTE — Progress Notes (Signed)
[place] to be D/C'd Home per MD order. Discussed with the patient and all questions fully answered.    VVS, Skin clean, dry and intact without evidence of skin break down, no evidence of skin tears noted.  IV catheter discontinued intact. Site without signs and symptoms of complications. Dressing and pressure applied.  An After Visit Summary was printed and given to the patient.  Patient escorted via [place], and D/C home via private auto.  Cyndra Numbers  07/26/2016 3:41 PM

## 2016-07-26 NOTE — Progress Notes (Signed)
      [address]       [place] [address]             [phone]      16 Days Post-Op Procedure(s) (LRB): VIDEO ASSISTED THORACOSCOPY (VATS)/WEDGE RESECTION (Right) THORACOTOMY (Right) Subjective: No issues overnight. Feels good this morning.   Objective: Vital signs in last 24 hours: Temp:  [97.6 F (36.4 C)-98 F (36.7 C)] 97.6 F (36.4 C) (06/27 0504) Pulse Rate:  [61-72] 61 (06/27 0504) Cardiac Rhythm: Normal sinus rhythm (06/26 1900) Resp:  [18] 18 (06/27 0504) BP: (100-124)/(49-66) 124/65 (06/27 0504) SpO2:  [95 %-98 %] 96 % (06/27 0504)    General appearance: alert, cooperative and no distress Heart: regular rate and rhythm, S1, S2 normal, no murmur, click, rub or gallop Lungs: clear to auscultation bilaterally Abdomen: soft, non-tender; bowel sounds normal; no masses,  no organomegaly Extremities: extremities normal, atraumatic, no cyanosis or edema Wound: pigtail catheter in good placement.   Lab Results: No results for input(s): WBC, HGB, HCT, PLT in the last 72 hours. BMET: No results for input(s): NA, K, CL, CO2, GLUCOSE, BUN, CREATININE, CALCIUM in the last 72 hours.  PT/INR: No results for input(s): LABPROT, INR in the last 72 hours. ABG    Component Value Date/Time   PHART 7.453 (H) 07/07/2016 1255   HCO3 26.2 07/07/2016 1255   TCO2 23 06/05/2014 1652   ACIDBASEDEF 2.0 06/04/2014 1851   O2SAT 96.2 07/07/2016 1255   CBG (last 3)  No results for input(s): GLUCAP in the last 72 hours.  Assessment/Plan: S/P Procedure(s) (LRB): VIDEO ASSISTED THORACOSCOPY (VATS)/WEDGE RESECTION (Right) THORACOTOMY (Right)  1. CV-NSR in the 60s, BP stable 2. Pulm-tolerating room air. Await CXR this morning. Large bore chest tube removed yesterday without issue. Pigtail catheter remains in place.  3. No recent labs to review  Plan: No air leak this morning. CXR with stable subQ emphysema. We will discontinue remaining pigtail catheter this  morning and do a follow-up CXR at noon. If stable, he may go home today and follow-up in the office next week. Plan discussed with Dr. Cyndia Bent.      LOS: 16 days    Elgie Collard 07/26/2016

## 2016-07-26 NOTE — Care Management Note (Signed)
Case Management Note Marvetta Gibbons RN, BSN Unit 2W-Case [place] coverage [phone]  Patient Details  Name: Kelly Olson. MRN: [id number] Date of Birth: 01/07/1951  Subjective/Objective:   Pt admitted s/p VATS, wedge resection- thoracotomy-on 6/11/8                 Action/Plan: PTA pt lived at home with wife- PCP- [name]-  Anticipate return with wife- CM to follow for d/c needs  Expected Discharge Date:  07/26/16               Expected Discharge Plan:  Home/Self Care  In-House Referral:     Discharge planning Services  CM Consult  Post Acute Care Choice:  NA Choice offered to:  NA  DME Arranged:    DME Agency:     HH Arranged:    [place] Agency:     Status of Service:  Completed, signed off  If discussed at [employer] of Stay Meetings, dates discussed:    Discharge Disposition: home/self care   Additional Comments:  07/26/16- 1430- Marvetta Gibbons RN,CM- stay complicated by SQ air post op- all chest tubes have been removed now- pt stable for d/c home with wife- no CM needs noted for discharge  Dawayne Patricia, RN 07/26/2016, 2:48 PM

## 2016-07-26 NOTE — Progress Notes (Signed)
16 Days Post-Op Procedure(s) (LRB): VIDEO ASSISTED THORACOSCOPY (VATS)/WEDGE RESECTION (Right) THORACOTOMY (Right) Subjective: No complaints  No change in breathing after chest tube removal yesterday.  Objective: Vital signs in last 24 hours: Temp:  [97.6 F (36.4 C)-98 F (36.7 C)] 97.6 F (36.4 C) (06/27 0504) Pulse Rate:  [61-72] 61 (06/27 0504) Cardiac Rhythm: Sinus bradycardia (06/27 0700) Resp:  [18] 18 (06/27 0504) BP: (100-124)/(49-66) 124/65 (06/27 0504) SpO2:  [95 %-98 %] 96 % (06/27 0504)  Hemodynamic parameters for last 24 hours:    Intake/Output from previous day: No intake/output data recorded. Intake/Output this shift: No intake/output data recorded.  General appearance: alert and cooperative Heart: regular rate and rhythm, S1, S2 normal, no murmur, click, rub or gallop Lungs: clear to auscultation bilaterally  No air leak from remaining chest tube with coughing Mild residual subcutaneous emphysema  Lab Results: No results for input(s): WBC, HGB, HCT, PLT in the last 72 hours. BMET: No results for input(s): NA, K, CL, CO2, GLUCOSE, BUN, CREATININE, CALCIUM in the last 72 hours.  PT/INR: No results for input(s): LABPROT, INR in the last 72 hours. ABG    Component Value Date/Time   PHART 7.453 (H) 07/07/2016 1255   HCO3 26.2 07/07/2016 1255   TCO2 23 06/05/2014 1652   ACIDBASEDEF 2.0 06/04/2014 1851   O2SAT 96.2 07/07/2016 1255   CBG (last 3)  No results for input(s): GLUCAP in the last 72 hours.  CLINICAL DATA:  Removal of 1 of 2 right chest tubes  EXAM: CHEST  2 VIEW  COMPARISON:  Chest radiograph from one day prior.  FINDINGS: Intact sternotomy wires. Removal of right basilar chest tube. Peripheral right upper pigtail chest tube is in place. Extensive subcutaneous emphysema throughout the bilateral chest wall and lower neck appears stable. Stable cardiomediastinal silhouette with normal heart size. No appreciable pneumothorax. No pleural  effusion. No pulmonary edema. Stable mild scarring versus atelectasis at the right greater than left lung bases. No acute consolidative airspace disease.  IMPRESSION: 1. No appreciable pneumothorax status post removal of 1 of 2 right chest tubes. 2. Stable extensive subcutaneous emphysema in the bilateral chest wall and lower neck. 3. Stable mild bibasilar scarring versus atelectasis, right greater than left.   Electronically Signed   By: Ilona Sorrel M.D.   On: 07/26/2016 08:01  Assessment/Plan: S/P Procedure(s) (LRB): VIDEO ASSISTED THORACOSCOPY (VATS)/WEDGE RESECTION (Right) THORACOTOMY (Right)  There is no air leak from the remaining chest tube and CXR looks ok so will remove the tube this am and repeat CXR in a couple hours. If that CXR looks ok then he can go home later today and I will see him next Tuesday with a CXR in the office.   LOS: 16 days    Gaye Pollack 07/26/2016

## 2016-07-28 ENCOUNTER — Encounter (HOSPITAL_COMMUNITY): Payer: Self-pay

## 2016-07-31 ENCOUNTER — Encounter (HOSPITAL_COMMUNITY): Payer: Self-pay

## 2016-07-31 ENCOUNTER — Other Ambulatory Visit: Payer: Self-pay | Admitting: Thoracic Surgery (Cardiothoracic Vascular Surgery)

## 2016-07-31 DIAGNOSIS — Z9889 Other specified postprocedural states: Secondary | ICD-10-CM

## 2016-07-31 DIAGNOSIS — Z951 Presence of aortocoronary bypass graft: Secondary | ICD-10-CM | POA: Insufficient documentation

## 2016-08-01 ENCOUNTER — Encounter: Payer: Self-pay | Admitting: Surgery

## 2016-08-01 ENCOUNTER — Ambulatory Visit (INDEPENDENT_AMBULATORY_CARE_PROVIDER_SITE_OTHER): Payer: Self-pay | Admitting: Surgery

## 2016-08-01 ENCOUNTER — Ambulatory Visit
Admission: RE | Admit: 2016-08-01 | Discharge: 2016-08-01 | Disposition: A | Discharge status: Home / Self Care | Payer: Medicare Other | Source: Ambulatory Visit | Attending: Thoracic Surgery (Cardiothoracic Vascular Surgery) | Admitting: Thoracic Surgery (Cardiothoracic Vascular Surgery)

## 2016-08-01 VITALS — BP 115/70 | HR 80 | Resp 20 | Ht 71.0 in | Wt 156.0 lb

## 2016-08-01 DIAGNOSIS — J982 Interstitial emphysema: Secondary | ICD-10-CM | POA: Diagnosis not present

## 2016-08-01 DIAGNOSIS — Z9889 Other specified postprocedural states: Secondary | ICD-10-CM

## 2016-08-01 DIAGNOSIS — D381 Neoplasm of uncertain behavior of trachea, bronchus and lung: Secondary | ICD-10-CM

## 2016-08-01 NOTE — Progress Notes (Signed)
HPI: Patient returns for routine postoperative follow-up having undergone right VATS and mini-thoracotomy on 07/10/2016 for wedge resection of a small RLL lung nodule. 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  The patient's early postoperative recovery while in the hospital was notable for development of an air leak after chest tube removal was marked subcutaneous emphysema up into his face. He had a right basilar chest tube placed and the air stabilized but then worsened and he had to have a pigtail pneumothorax catheter inserted anteriorly a couple days later. The air leak eventually resolved and the subcutaneous emphysema improved. Since hospital discharge the patient reports that he has been feeling well and is back to his aerobic exercise. He has not been lifting weights.   Current Outpatient Prescriptions  Medication Sig Dispense Refill  . aspirin 81 MG tablet Take 81 mg by mouth daily.    Marland Kitchen atorvastatin (LIPITOR) 40 MG tablet ALTERNATE 20 MG ( 1/2 TABLET) AND 40 MG ( 1 TABLET) EVERY OTHER DAY 90 tablet 3  . carvedilol (COREG) 3.125 MG tablet Take 2 tablets (6.25 mg total) by mouth 2 (two) times daily. 360 tablet 1  . losartan (COZAAR) 25 MG tablet Take 1 tablet (25 mg total) by mouth daily. 30 tablet 1  . oxyCODONE (OXY IR/ROXICODONE) 5 MG immediate release tablet Take 1 tablet (5 mg total) by mouth every 12 (twelve) hours as needed for severe pain. 15 tablet 0  . sildenafil (VIAGRA) 50 MG tablet Take 1 tablet (50 mg total) by mouth daily as needed for erectile dysfunction. 10 tablet 5  . Vitamin D, Cholecalciferol, 1000 units CAPS Take 1 capsule by mouth daily.    Marland Kitchen zinc sulfate 220 (50 Zn) MG capsule Take 1 capsule (220 mg total) by mouth daily. 30 capsule 1   No current facility-administered medications for this visit.     Physical Exam: BP 115/70   Pulse 80   Resp 20   Ht 5\' 11"  (1.803 m)   Wt 156 lb  (70.8 kg)   SpO2 99% Comment: RA  BMI 21.76 kg/m  He looks well Subcutaneous emphysema is mostly resolved with a small amount laterally remaining. Lungs are clear The chest tube sites are healed and I removed the chest tube sutures.  Diagnostic Tests:  CLINICAL DATA:  66 year old male for followup of right thoracotomy and wedge resection of right lower lobe nodule.  EXAM: CHEST  2 VIEW  COMPARISON:  07/26/2016 and prior exams  FINDINGS: Cardiomediastinal silhouette is unchanged with evidence of CABG.  Pneumomediastinum and bilateral subcutaneous emphysema have decreased.  Surgical changes/ atelectasis in the right lower lung again noted.  There is no evidence of pneumothorax or new pulmonary opacity.  IMPRESSION: Decreased in pneumomediastinum and bilateral subcutaneous emphysema. No evidence of pneumothorax.  Surgical changes/atelectasis within the right lower lung again noted.   Electronically Signed   By: Margarette Canada M.D.   On: 08/01/2016 14:08   Impression:  He is doing well with no ptx and resolution of his subcutaneous emphysema. I will plan surveillance chest CT in 3 months to check a baseline. I told him that he can continue aerobic activity and can return to cardiac rehab now. He should avoid lifting more than 15 lbs for another 4 weeks.  Plan:  He will return to see me in 3 months with a chest CT.   Gaye Pollack, MD [facility] [phone])  [phone] 

## 2016-08-04 ENCOUNTER — Encounter (HOSPITAL_COMMUNITY): Payer: Self-pay

## 2016-08-07 ENCOUNTER — Encounter (HOSPITAL_COMMUNITY): Admission: RE | Admit: 2016-08-07 | Payer: Self-pay | Source: Ambulatory Visit

## 2016-08-07 DIAGNOSIS — Z09 Encounter for follow-up examination after completed treatment for conditions other than malignant neoplasm: Secondary | ICD-10-CM | POA: Diagnosis not present

## 2016-08-07 DIAGNOSIS — C3491 Malignant neoplasm of unspecified part of right bronchus or lung: Secondary | ICD-10-CM | POA: Diagnosis not present

## 2016-08-07 DIAGNOSIS — Z902 Acquired absence of lung [part of]: Secondary | ICD-10-CM | POA: Diagnosis not present

## 2016-08-09 ENCOUNTER — Encounter (HOSPITAL_COMMUNITY)
Admission: RE | Admit: 2016-08-09 | Discharge: 2016-08-09 | Disposition: A | Discharge status: Home / Self Care | Payer: Self-pay | Source: Ambulatory Visit | Attending: Cardiology | Admitting: Cardiology

## 2016-08-11 ENCOUNTER — Encounter (HOSPITAL_COMMUNITY)
Admission: RE | Admit: 2016-08-11 | Discharge: 2016-08-11 | Disposition: A | Discharge status: Home / Self Care | Payer: Self-pay | Source: Ambulatory Visit | Attending: Cardiology | Admitting: Cardiology

## 2016-08-14 ENCOUNTER — Encounter (HOSPITAL_COMMUNITY)
Admission: RE | Admit: 2016-08-14 | Discharge: 2016-08-14 | Disposition: A | Discharge status: Home / Self Care | Payer: Self-pay | Source: Ambulatory Visit | Attending: Cardiology | Admitting: Cardiology

## 2016-08-16 ENCOUNTER — Encounter (HOSPITAL_COMMUNITY)
Admission: RE | Admit: 2016-08-16 | Discharge: 2016-08-16 | Disposition: A | Discharge status: Home / Self Care | Payer: Self-pay | Source: Ambulatory Visit | Attending: Cardiology | Admitting: Cardiology

## 2016-08-16 ENCOUNTER — Ambulatory Visit: Payer: BLUE CROSS/BLUE SHIELD | Admitting: Surgery

## 2016-08-18 ENCOUNTER — Encounter (HOSPITAL_COMMUNITY)
Admission: RE | Admit: 2016-08-18 | Discharge: 2016-08-18 | Disposition: A | Discharge status: Home / Self Care | Payer: Self-pay | Source: Ambulatory Visit | Attending: Cardiology | Admitting: Cardiology

## 2016-08-21 ENCOUNTER — Encounter (HOSPITAL_COMMUNITY)
Admission: RE | Admit: 2016-08-21 | Discharge: 2016-08-21 | Disposition: A | Discharge status: Home / Self Care | Payer: Self-pay | Source: Ambulatory Visit | Attending: Cardiology | Admitting: Cardiology

## 2016-08-21 ENCOUNTER — Other Ambulatory Visit: Payer: Self-pay | Admitting: Cardiology

## 2016-08-21 MED ORDER — CARVEDILOL 3.125 MG PO TABS: 6.2500 mg | ORAL_TABLET | Freq: Two times a day (BID) | ORAL | 1 refills | Status: DC

## 2016-08-21 MED ORDER — LOSARTAN POTASSIUM 25 MG PO TABS: 25.0000 mg | ORAL_TABLET | Freq: Every day | ORAL | 2 refills | Status: DC

## 2016-08-21 MED ORDER — ATORVASTATIN CALCIUM 40 MG PO TABS: ORAL_TABLET | ORAL | 1 refills | Status: DC

## 2016-08-21 NOTE — Telephone Encounter (Signed)
New Message   Needs new prescriptions sent over to [facility]   *STAT* If patient is at the pharmacy, call can be transferred to refill team.   1. Which medications need to be refilled? (please list name of each medication and dose if known)   atorvastatin (LIPITOR) 40 MG tablet ALTERNATE 20 MG ( 1/2 TABLET) AND 40 MG ( 1 TABLET) EVERY OTHER DAY   Needs the Carvedilol done first the others he has a few refills on   carvedilol (COREG) 3.125 MG tablet Take 2 tablets (6.25 mg total) by mouth 2 (two) times daily.   losartan (COZAAR) 25 MG tablet Take 1 tablet (25 mg total) by mouth daily.     2. Which pharmacy/location (including street and city if local pharmacy) is medication to be sent to? [facility] - mail order   3. Do they need a 30 day or 90 day supply?  [place]

## 2016-08-23 ENCOUNTER — Ambulatory Visit: Payer: BLUE CROSS/BLUE SHIELD | Admitting: Surgery

## 2016-08-23 ENCOUNTER — Encounter (HOSPITAL_COMMUNITY)
Admission: RE | Admit: 2016-08-23 | Discharge: 2016-08-23 | Disposition: A | Discharge status: Home / Self Care | Payer: Self-pay | Source: Ambulatory Visit | Attending: Cardiology | Admitting: Cardiology

## 2016-08-25 ENCOUNTER — Encounter (HOSPITAL_COMMUNITY)
Admission: RE | Admit: 2016-08-25 | Discharge: 2016-08-25 | Disposition: A | Discharge status: Home / Self Care | Payer: Self-pay | Source: Ambulatory Visit | Attending: Cardiology | Admitting: Cardiology

## 2016-08-28 ENCOUNTER — Encounter (HOSPITAL_COMMUNITY)
Admission: RE | Admit: 2016-08-28 | Discharge: 2016-08-28 | Disposition: A | Discharge status: Home / Self Care | Payer: Self-pay | Source: Ambulatory Visit | Attending: Cardiology | Admitting: Cardiology

## 2016-08-30 ENCOUNTER — Encounter (HOSPITAL_COMMUNITY)
Admission: RE | Admit: 2016-08-30 | Discharge: 2016-08-30 | Disposition: A | Discharge status: Home / Self Care | Payer: Self-pay | Source: Ambulatory Visit | Attending: Cardiology | Admitting: Cardiology

## 2016-08-30 DIAGNOSIS — Z951 Presence of aortocoronary bypass graft: Secondary | ICD-10-CM | POA: Insufficient documentation

## 2016-09-01 ENCOUNTER — Encounter (HOSPITAL_COMMUNITY)
Admission: RE | Admit: 2016-09-01 | Discharge: 2016-09-01 | Disposition: A | Discharge status: Home / Self Care | Payer: Self-pay | Source: Ambulatory Visit | Attending: Cardiology | Admitting: Cardiology

## 2016-09-04 ENCOUNTER — Encounter (HOSPITAL_COMMUNITY)
Admission: RE | Admit: 2016-09-04 | Discharge: 2016-09-04 | Disposition: A | Discharge status: Home / Self Care | Payer: Self-pay | Source: Ambulatory Visit | Attending: Cardiology | Admitting: Cardiology

## 2016-09-06 ENCOUNTER — Encounter (HOSPITAL_COMMUNITY)
Admission: RE | Admit: 2016-09-06 | Discharge: 2016-09-06 | Disposition: A | Discharge status: Home / Self Care | Payer: Self-pay | Source: Ambulatory Visit | Attending: Cardiology | Admitting: Cardiology

## 2016-09-08 ENCOUNTER — Encounter (HOSPITAL_COMMUNITY)
Admission: RE | Admit: 2016-09-08 | Discharge: 2016-09-08 | Disposition: A | Discharge status: Home / Self Care | Payer: Self-pay | Source: Ambulatory Visit | Attending: Cardiology | Admitting: Cardiology

## 2016-09-11 ENCOUNTER — Encounter (HOSPITAL_COMMUNITY)
Admission: RE | Admit: 2016-09-11 | Discharge: 2016-09-11 | Disposition: A | Discharge status: Home / Self Care | Payer: Self-pay | Source: Ambulatory Visit | Attending: Cardiology | Admitting: Cardiology

## 2016-09-13 ENCOUNTER — Encounter (HOSPITAL_COMMUNITY)
Admission: RE | Admit: 2016-09-13 | Discharge: 2016-09-13 | Disposition: A | Discharge status: Home / Self Care | Payer: Self-pay | Source: Ambulatory Visit | Attending: Cardiology | Admitting: Cardiology

## 2016-09-15 ENCOUNTER — Encounter (HOSPITAL_COMMUNITY)
Admission: RE | Admit: 2016-09-15 | Discharge: 2016-09-15 | Disposition: A | Discharge status: Home / Self Care | Payer: Self-pay | Source: Ambulatory Visit | Attending: Cardiology | Admitting: Cardiology

## 2016-09-18 ENCOUNTER — Encounter (HOSPITAL_COMMUNITY)
Admission: RE | Admit: 2016-09-18 | Discharge: 2016-09-18 | Disposition: A | Discharge status: Home / Self Care | Payer: Self-pay | Source: Ambulatory Visit | Attending: Cardiology | Admitting: Cardiology

## 2016-09-20 ENCOUNTER — Encounter (HOSPITAL_COMMUNITY)
Admission: RE | Admit: 2016-09-20 | Discharge: 2016-09-20 | Disposition: A | Discharge status: Home / Self Care | Payer: Self-pay | Source: Ambulatory Visit | Attending: Cardiology | Admitting: Cardiology

## 2016-09-22 ENCOUNTER — Encounter (HOSPITAL_COMMUNITY): Payer: Self-pay

## 2016-09-25 ENCOUNTER — Encounter (HOSPITAL_COMMUNITY)
Admission: RE | Admit: 2016-09-25 | Discharge: 2016-09-25 | Disposition: A | Discharge status: Home / Self Care | Payer: Self-pay | Source: Ambulatory Visit | Attending: Cardiology | Admitting: Cardiology

## 2016-09-27 ENCOUNTER — Encounter (HOSPITAL_COMMUNITY): Payer: Self-pay

## 2016-09-27 ENCOUNTER — Ambulatory Visit (INDEPENDENT_AMBULATORY_CARE_PROVIDER_SITE_OTHER): Payer: Medicare Other | Admitting: Cardiology

## 2016-09-27 ENCOUNTER — Encounter: Payer: Self-pay | Admitting: Cardiology

## 2016-09-27 VITALS — BP 122/68 | HR 64 | Ht 71.0 in | Wt 161.0 lb

## 2016-09-27 DIAGNOSIS — Z9889 Other specified postprocedural states: Secondary | ICD-10-CM

## 2016-09-27 DIAGNOSIS — I255 Ischemic cardiomyopathy: Secondary | ICD-10-CM | POA: Diagnosis not present

## 2016-09-27 DIAGNOSIS — R945 Abnormal results of liver function studies: Secondary | ICD-10-CM | POA: Diagnosis not present

## 2016-09-27 DIAGNOSIS — Z87891 Personal history of nicotine dependence: Secondary | ICD-10-CM

## 2016-09-27 DIAGNOSIS — E785 Hyperlipidemia, unspecified: Secondary | ICD-10-CM

## 2016-09-27 DIAGNOSIS — Z13220 Encounter for screening for lipoid disorders: Secondary | ICD-10-CM | POA: Diagnosis not present

## 2016-09-27 DIAGNOSIS — I25119 Atherosclerotic heart disease of native coronary artery with unspecified angina pectoris: Secondary | ICD-10-CM

## 2016-09-27 DIAGNOSIS — I209 Angina pectoris, unspecified: Secondary | ICD-10-CM

## 2016-09-27 LAB — HEPATIC FUNCTION PANEL
ALBUMIN: 4.1 g/dL (ref 3.6–4.8)
ALK PHOS: 89 IU/L (ref 39–117)
ALT: 22 IU/L (ref 0–44)
AST: 24 IU/L (ref 0–40)
BILIRUBIN TOTAL: 0.4 mg/dL (ref 0.0–1.2)
Bilirubin, Direct: 0.11 mg/dL (ref 0.00–0.40)
Total Protein: 6.8 g/dL (ref 6.0–8.5)

## 2016-09-27 LAB — LIPID PANEL
CHOLESTEROL TOTAL: 123 mg/dL (ref 100–199)
Chol/HDL Ratio: 2.3 ratio (ref 0.0–5.0)
HDL: 54 mg/dL (ref >39)
LDL CALC: 56 mg/dL (ref 0–99)
Triglycerides: 65 mg/dL (ref 0–149)
VLDL Cholesterol Cal: 13 mg/dL (ref 5–40)

## 2016-09-27 NOTE — Progress Notes (Signed)
PCP: [name], MD  Clinic Note: Chief Complaint  Patient presents with  . Follow-up    no chest pain, shortness of breath, edema, pain or cramping in legs, lightheaded or dizziness  . Coronary Artery Disease    HPI: Kelly Olson. Kelly Olson") is a 66 y.o. male with a PMH below who presents today for post-op f/u from RLL wedge resection & routine 6 month f/u. He was diagnosed with MV CAD in May 2016   Initial presented with SSx c/w Class II-III Angina --> (had previously been walking several miles daily)  High Risk Myoview (April 2016) ->   Cath with MV CAD (100% LAD, 99% mRCA-100% dRCA & 90% p-m Cx) w/ EF 35-45% -->   CABG: LIMA-OM, RIMA-LAD, SVG-Diag, SVG-rPDA  Repeat Echo: EF 50-55%  Continues with Maintenance Program of Cardiac Rehab.  Kelly Olson. was last seen on 03/21/2016. Was doing very well at that time continuing to do his full work at routine with cardiac rehabilitation. Able to achieve >6 METS without any difficulty.  He went for routine screening CT scan this year and they found nodule that had grown in size, so he was referred to Dr. Cyndia Bent who performed right lower lobe wedge resection. Recent Hospitalizations: - 6/11: s/p R thoracotomy for RLL nodule wedge resection -- Squamous Cell CA.(Dr. Cyndia Bent) - negative margins. - complicated by air leak (Sub Q emphysema - redo chest tube x 2)  Studies Personally Reviewed - (if available, images/films reviewed: From Epic Chart or Care Everywhere)  none  Interval History: "Kelly Olson" presents today for routine 6 month follow-up with having had his surgery in the interim. He was doing well prior to the surgery, and is now fully recovered his back is full activity level. He is back playing golf. He is to note improvement of his smoker's cough with smoking related dyspnea. He is now denying any chest tightness pressure with rest or exertion. The soreness from the surgeries better. He also notes having made adjustments to  his diet and overall feels healthier than he had before. He is hoping to have a follow-up valuation in September or October timeframe for his cancer. He remains positive in his outlook.  From a cardiology standpoint he remains totally stable. Cardiac review of symptoms as follows: No chest pain or shortness of breath with rest or exertion.  No PND, orthopnea or edema.  No palpitations, lightheadedness, dizziness, weakness or syncope/near syncope. No TIA/amaurosis fugax symptoms. No melena, hematochezia, hematuria, or epstaxis. No claudication.  ROS: A comprehensive was performed. Review of Systems  Constitutional: Negative for malaise/fatigue (has essentially fully recovered from his surgery).  HENT: Positive for congestion (Related to allergies.).   Respiratory:       His smoker's cough is all but resolved now.  Cardiovascular:       Per HPI  Gastrointestinal: Positive for heartburn (depending on what he eats).  Musculoskeletal: Positive for joint pain (knee pain).  Endo/Heme/Allergies: Positive for environmental allergies.  Psychiatric/Behavioral: The patient has insomnia.   All other systems reviewed and are negative.  I have reviewed and (if needed) personally updated the patient's problem list, medications, allergies, past medical and surgical history, social and family history.   Past Medical History:  Diagnosis Date  . Coronary artery disease, occlusive 06/02/2014   Multivessel CAD.mLAD-100%, mRCA 99%, dRCA 100%, mLCX 90% And EF 35-45%.  . Former heavy tobacco smoker    Quit in April 2016   . Hyperlipidemia with target LDL  less than 70   . Ischemic cardiomyopathy - resolved 05/2014   Myoview: EF ~33% with "infarction vs. severe resting ischemia in LAD & RCA territory; b) EF by Cath: 35-45%. c) post CABG Echo 9016: EF 50-55%  . Lung nodules    right lung lower lobe  . 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 CARDIAC CATHETERIZATION N/A 06/02/2014   Procedure: Left Heart Cath and Coronary Angiography;  Surgeon: Leonie Man, MD;  Location: [facility];  Service: Cardiovascular;  mLAD-100%, mRCA 99%, dRCA 100%, mLCX 90% And EF 35-45%.  . CORONARY ARTERY BYPASS GRAFT N/A 06/04/2014   Procedure: CORONARY ARTERY BYPASS GRAFTING (CABG) times four using bilateral internal mammary arteries and EVH for left  leg saphenous vein;  Surgeon: Gaye Pollack, MD;  Location: [facility];  Service: Open Heart Surgery;  LIMA-OM, RIMA-LAD, SVG-Diag, SVG-rPDA  . NM MYOVIEW LTD  05/28/2014   Pre-CABG:  High Risk Nuclear Stress Test with multivessel distribution ischemia. -- Severely ischemic Cardiomyoapthy with evidence of at least 2 vessel disease & EF of ~33%. Images are consistent with either infarction or severe resting ischemia in the LAD & RCA territory.  . TEE WITHOUT CARDIOVERSION N/A 06/04/2014   Procedure: TRANSESOPHAGEAL ECHOCARDIOGRAM (TEE);  Surgeon: Gaye Pollack, MD;  Location: [facility];  Service: Open Heart Surgery;  Laterality: N/A;  . THORACOTOMY Right 07/10/2016   Procedure: THORACOTOMY;  Surgeon: Gaye Pollack, MD;  Location: [facility];  Service: Thoracic;  Laterality: Right;  . TONSILLECTOMY  1957  . TRANSTHORACIC ECHOCARDIOGRAM  10/2014   Mild concentric LVH. EF 50-55% with mild HK of basal anteroseptal myocardium. GR 1 DD.  Marland Kitchen VIDEO ASSISTED THORACOSCOPY (VATS)/WEDGE RESECTION Right 07/10/2016   Procedure: VIDEO ASSISTED THORACOSCOPY (VATS)/WEDGE RESECTION;  Surgeon: Gaye Pollack, MD;  Location: [facility];  Service: Thoracic;  Laterality: Right;    Current Meds  Medication Sig  . aspirin 81 MG tablet Take 81 mg by mouth daily.  Marland Kitchen atorvastatin (LIPITOR) 40 MG tablet ALTERNATE 20 MG ( 1/2 TABLET) AND 40 MG ( 1 TABLET) EVERY OTHER DAY  . carvedilol (COREG) 3.125 MG tablet Take 2 tablets (6.25 mg total) by mouth 2 (two) times daily.  Marland Kitchen losartan (COZAAR) 25 MG tablet Take 1 tablet  (25 mg total) by mouth daily.  . sildenafil (VIAGRA) 50 MG tablet Take 1 tablet (50 mg total) by mouth daily as needed for erectile dysfunction.  . Vitamin D, Cholecalciferol, 1000 units CAPS Take 1 capsule by mouth daily.    Allergies  Allergen Reactions  . No Known Allergies     Social History   Social History  . Marital status: Married    Spouse name: N/A  . Number of children: N/A  . Years of education: N/A   Social History Main Topics  . Smoking status: Former Smoker    Packs/day: 0.80    Years: 46.00    Types: Cigarettes    Quit date: 05/29/2014  . Smokeless tobacco: Never Used  . Alcohol use 8.4 - 12.6 oz/week    7 Glasses of wine, 7 - 14 Standard drinks or equivalent per week  . Drug use: No  . Sexual activity: Yes   Other Topics Concern  . None   Social History Narrative   Wife recently diagnosed with early stage breast cancer    family history includes Cardiomyopathy in his father;  Emphysema in his mother; Healthy in his sister and sister; Heart attack in his maternal grandfather; Heart disease in his paternal uncle.  Wt Readings from Last 3 Encounters:  09/27/16 161 lb (73 kg)  08/01/16 156 lb (70.8 kg)  07/22/16 149 lb 14.4 oz (68 kg)  Feb 154 lb   PHYSICAL EXAM BP 122/68   Pulse 64   Ht 5\' 11"  (1.803 m)   Wt 161 lb (73 kg)   BMI 22.45 kg/m  Physical Exam  Constitutional: He is oriented to person, place, and time. He appears well-developed and well-nourished. No distress.  Well-groomed  HENT:  Head: Normocephalic and atraumatic.  Mouth/Throat: Oropharynx is clear and moist.  Mild exophthalmos  Eyes: Pupils are equal, round, and reactive to light. EOM are normal.  Neck: No hepatojugular reflux and no JVD present. Carotid bruit is not present.  Cardiovascular: Normal rate, regular rhythm, normal heart sounds and intact distal pulses.  Exam reveals no gallop and no friction rub.   No murmur heard. Pulmonary/Chest: Effort normal and breath sounds  normal. No respiratory distress. He has no wheezes. He has no rales. He exhibits no tenderness.  Abdominal: Soft. Bowel sounds are normal. He exhibits no distension. There is no tenderness. There is no rebound.  Musculoskeletal: Normal range of motion. He exhibits no edema or deformity.  Neurological: He is alert and oriented to person, place, and time.  Skin: Skin is warm and dry. No rash noted. No erythema.  Psychiatric: He has a normal mood and affect. His behavior is normal. Judgment and thought content normal.  Nursing note and vitals reviewed.   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 No further chest . Continues to be very active with vigorous workouts. He is starting to get back to his full preop routine. At that time he was starting to jog and run. He is playing golf routinely as well.      Relevant Orders   Lipid panel (Completed)   Hepatic function panel (Completed)   Cardiomyopathy, ischemic: EF ~35-45% by LV Gram --> 50 and 55% by echo (Chronic)    Essentially resolved following revascularization. No heart failure symptoms. Continue carvedilol, losartan and aspirin at low doses.      Relevant Orders   Lipid panel (Completed)   Hepatic function panel (Completed)   Coronary artery disease involving native coronary artery with angina pectoris ([place]) - Primary (Chronic)    Multivessel CAD noted on cardiac catheterization. Now status post CABG doing very well. Continues with cardiac rehabilitation. He is on  Aspirin, statin, beta blocker and ARB at stable doses.no change from now.      Relevant Orders   Lipid panel  (Completed)   Hepatic function panel (Completed)   Former heavy tobacco smoker - quit when he had diagnoses of CAD (Chronic)    E now has fully quit smoking and has no desire to ever go back. His smoker's cough is a all but resolved.      Relevant Orders   Lipid panel (Completed)   Hepatic function panel (Completed)   Hyperlipidemia  with target LDL less than 70 (Chronic)    He should be due for lipid check but have not seen one recently.  We will check lipids and LFTs today. He is currently alternating  Lipitor 20 mg and 40 mg every other day.      Relevant Orders   Lipid panel (Completed)   Hepatic function panel (Completed)   S/P thoracotomy    He is essentiallynow. Doing well. Hoping or clean follow-up scan this fall.         Current medicines are reviewed at length with the patient today. (+/- concerns) none The following changes have been made: none  Patient Instructions  Medication Instructions:  Your physician recommends that you continue on your current medications as directed. Please refer to the Current Medication list given to you today.  Labwork: Your physician recommends that you return for lab work in: TODAY-LIPIDS, LFT  Testing/Procedures: NONE   Follow-Up: Your physician wants you to follow-up in: 6 MONTHS with DR HARDING. You will receive a reminder letter in the mail two months in advance. If you don't receive a letter, please call our office to schedule the follow-up appointment.  Any Other Special Instructions Will Be Listed Below (If Applicable).     If you need a refill on your cardiac medications before your next appointment, please call your pharmacy.    Studies Ordered:   Orders Placed This Encounter  Procedures  . Lipid panel  . Hepatic function panel      Glenetta Hew, M.D., M.S. Interventional Cardiologist   Pager # [phone] Phone # [phone] [address] [place], [place] [address]

## 2016-09-27 NOTE — Patient Instructions (Addendum)
Medication Instructions:  Your physician recommends that you continue on your current medications as directed. Please refer to the Current Medication list given to you today.  Labwork: Your physician recommends that you return for lab work in: TODAY-LIPIDS, LFT  Testing/Procedures: NONE   Follow-Up: Your physician wants you to follow-up in: 6 MONTHS with DR HARDING. You will receive a reminder letter in the mail two months in advance. If you don't receive a letter, please call our office to schedule the follow-up appointment.  Any Other Special Instructions Will Be Listed Below (If Applicable).     If you need a refill on your cardiac medications before your next appointment, please call your pharmacy.

## 2016-09-29 ENCOUNTER — Encounter (HOSPITAL_COMMUNITY)
Admission: RE | Admit: 2016-09-29 | Discharge: 2016-09-29 | Disposition: A | Discharge status: Home / Self Care | Payer: Self-pay | Source: Ambulatory Visit | Attending: Cardiology | Admitting: Cardiology

## 2016-09-30 ENCOUNTER — Encounter: Payer: Self-pay | Admitting: Cardiology

## 2016-09-30 NOTE — Assessment & Plan Note (Signed)
Essentially resolved following revascularization. No heart failure symptoms. Continue carvedilol, losartan and aspirin at low doses.

## 2016-09-30 NOTE — Assessment & Plan Note (Signed)
He should be due for lipid check but have not seen one recently.  We will check lipids and LFTs today. He is currently alternating  Lipitor 20 mg and 40 mg every other day.

## 2016-09-30 NOTE — Assessment & Plan Note (Signed)
Resolved post CABG. No further chest . Continues to be very active with vigorous workouts. He is starting to get back to his full preop routine. At that time he was starting to jog and run. He is playing golf routinely as well.

## 2016-09-30 NOTE — Assessment & Plan Note (Signed)
E now has fully quit smoking and has no desire to ever go back. His smoker's cough is a all but resolved.

## 2016-09-30 NOTE — Assessment & Plan Note (Signed)
Multivessel CAD noted on cardiac catheterization. Now status post CABG doing very well. Continues with cardiac rehabilitation. He is on  Aspirin, statin, beta blocker and ARB at stable doses.no change from now.

## 2016-09-30 NOTE — Assessment & Plan Note (Signed)
He is essentiallynow. Doing well. Hoping or clean follow-up scan this fall.

## 2016-10-01 ENCOUNTER — Other Ambulatory Visit: Payer: Self-pay | Admitting: Cardiology

## 2016-10-01 ENCOUNTER — Other Ambulatory Visit: Payer: Self-pay | Admitting: Physician Assistant

## 2016-10-03 NOTE — Telephone Encounter (Signed)
REFILL 

## 2016-10-04 ENCOUNTER — Encounter (HOSPITAL_COMMUNITY)
Admission: RE | Admit: 2016-10-04 | Discharge: 2016-10-04 | Disposition: A | Discharge status: Home / Self Care | Payer: Self-pay | Source: Ambulatory Visit | Attending: Cardiology | Admitting: Cardiology

## 2016-10-04 DIAGNOSIS — Z951 Presence of aortocoronary bypass graft: Secondary | ICD-10-CM | POA: Insufficient documentation

## 2016-10-06 ENCOUNTER — Encounter (HOSPITAL_COMMUNITY): Payer: Self-pay

## 2016-10-09 ENCOUNTER — Encounter (HOSPITAL_COMMUNITY)
Admission: RE | Admit: 2016-10-09 | Discharge: 2016-10-09 | Disposition: A | Discharge status: Home / Self Care | Payer: Self-pay | Source: Ambulatory Visit | Attending: Cardiology | Admitting: Cardiology

## 2016-10-09 ENCOUNTER — Other Ambulatory Visit: Payer: Self-pay | Admitting: Physician Assistant

## 2016-10-11 ENCOUNTER — Encounter (HOSPITAL_COMMUNITY): Admission: RE | Admit: 2016-10-11 | Payer: Self-pay | Source: Ambulatory Visit

## 2016-10-13 ENCOUNTER — Encounter (HOSPITAL_COMMUNITY): Payer: Self-pay

## 2016-10-16 ENCOUNTER — Encounter (HOSPITAL_COMMUNITY)
Admission: RE | Admit: 2016-10-16 | Discharge: 2016-10-16 | Disposition: A | Discharge status: Home / Self Care | Payer: Self-pay | Source: Ambulatory Visit | Attending: Cardiology | Admitting: Cardiology

## 2016-10-18 ENCOUNTER — Encounter (HOSPITAL_COMMUNITY)
Admission: RE | Admit: 2016-10-18 | Discharge: 2016-10-18 | Disposition: A | Discharge status: Home / Self Care | Payer: Self-pay | Source: Ambulatory Visit | Attending: Cardiology | Admitting: Cardiology

## 2016-10-20 ENCOUNTER — Other Ambulatory Visit: Payer: Self-pay | Admitting: *Deleted

## 2016-10-20 ENCOUNTER — Encounter (HOSPITAL_COMMUNITY)
Admission: RE | Admit: 2016-10-20 | Discharge: 2016-10-20 | Disposition: A | Discharge status: Home / Self Care | Payer: Self-pay | Source: Ambulatory Visit | Attending: Cardiology | Admitting: Cardiology

## 2016-10-20 DIAGNOSIS — R918 Other nonspecific abnormal finding of lung field: Secondary | ICD-10-CM

## 2016-10-20 DIAGNOSIS — C3490 Malignant neoplasm of unspecified part of unspecified bronchus or lung: Secondary | ICD-10-CM

## 2016-10-23 ENCOUNTER — Encounter (HOSPITAL_COMMUNITY)
Admission: RE | Admit: 2016-10-23 | Discharge: 2016-10-23 | Disposition: A | Discharge status: Home / Self Care | Payer: Self-pay | Source: Ambulatory Visit | Attending: Cardiology | Admitting: Cardiology

## 2016-10-25 ENCOUNTER — Encounter (HOSPITAL_COMMUNITY): Payer: Self-pay

## 2016-10-27 ENCOUNTER — Encounter (HOSPITAL_COMMUNITY): Payer: Self-pay

## 2016-10-30 ENCOUNTER — Encounter (HOSPITAL_COMMUNITY)
Admission: RE | Admit: 2016-10-30 | Discharge: 2016-10-30 | Disposition: A | Discharge status: Home / Self Care | Payer: Self-pay | Source: Ambulatory Visit | Attending: Cardiology | Admitting: Cardiology

## 2016-10-30 DIAGNOSIS — Z951 Presence of aortocoronary bypass graft: Secondary | ICD-10-CM | POA: Insufficient documentation

## 2016-11-01 ENCOUNTER — Encounter (HOSPITAL_COMMUNITY)
Admission: RE | Admit: 2016-11-01 | Discharge: 2016-11-01 | Disposition: A | Discharge status: Home / Self Care | Payer: Self-pay | Source: Ambulatory Visit | Attending: Cardiology | Admitting: Cardiology

## 2016-11-01 DIAGNOSIS — Z23 Encounter for immunization: Secondary | ICD-10-CM | POA: Diagnosis not present

## 2016-11-03 ENCOUNTER — Encounter (HOSPITAL_COMMUNITY)
Admission: RE | Admit: 2016-11-03 | Discharge: 2016-11-03 | Disposition: A | Discharge status: Home / Self Care | Payer: Self-pay | Source: Ambulatory Visit | Attending: Cardiology | Admitting: Cardiology

## 2016-11-06 ENCOUNTER — Encounter (HOSPITAL_COMMUNITY)
Admission: RE | Admit: 2016-11-06 | Discharge: 2016-11-06 | Disposition: A | Discharge status: Home / Self Care | Payer: Self-pay | Source: Ambulatory Visit | Attending: Cardiology | Admitting: Cardiology

## 2016-11-08 ENCOUNTER — Encounter (HOSPITAL_COMMUNITY)
Admission: RE | Admit: 2016-11-08 | Discharge: 2016-11-08 | Disposition: A | Discharge status: Home / Self Care | Payer: Self-pay | Source: Ambulatory Visit | Attending: Cardiology | Admitting: Cardiology

## 2016-11-10 ENCOUNTER — Encounter (HOSPITAL_COMMUNITY): Payer: Self-pay

## 2016-11-13 ENCOUNTER — Encounter (HOSPITAL_COMMUNITY)
Admission: RE | Admit: 2016-11-13 | Discharge: 2016-11-13 | Disposition: A | Discharge status: Home / Self Care | Payer: Self-pay | Source: Ambulatory Visit | Attending: Cardiology | Admitting: Cardiology

## 2016-11-14 DIAGNOSIS — H2513 Age-related nuclear cataract, bilateral: Secondary | ICD-10-CM | POA: Diagnosis not present

## 2016-11-14 DIAGNOSIS — H04123 Dry eye syndrome of bilateral lacrimal glands: Secondary | ICD-10-CM | POA: Diagnosis not present

## 2016-11-14 DIAGNOSIS — H40013 Open angle with borderline findings, low risk, bilateral: Secondary | ICD-10-CM | POA: Diagnosis not present

## 2016-11-14 DIAGNOSIS — H25013 Cortical age-related cataract, bilateral: Secondary | ICD-10-CM | POA: Diagnosis not present

## 2016-11-15 ENCOUNTER — Ambulatory Visit
Admission: RE | Admit: 2016-11-15 | Discharge: 2016-11-15 | Disposition: A | Discharge status: Home / Self Care | Payer: Medicare Other | Source: Ambulatory Visit | Attending: Surgery | Admitting: Surgery

## 2016-11-15 ENCOUNTER — Ambulatory Visit: Payer: Medicare Other | Admitting: Surgery

## 2016-11-15 ENCOUNTER — Encounter (HOSPITAL_COMMUNITY)
Admission: RE | Admit: 2016-11-15 | Discharge: 2016-11-15 | Disposition: A | Discharge status: Home / Self Care | Payer: Self-pay | Source: Ambulatory Visit | Attending: Cardiology | Admitting: Cardiology

## 2016-11-15 DIAGNOSIS — C3490 Malignant neoplasm of unspecified part of unspecified bronchus or lung: Secondary | ICD-10-CM | POA: Diagnosis not present

## 2016-11-15 DIAGNOSIS — R918 Other nonspecific abnormal finding of lung field: Secondary | ICD-10-CM

## 2016-11-15 MED ORDER — IOPAMIDOL (ISOVUE-300) INJECTION 61%
75.0000 mL | Freq: Once | INTRAVENOUS | Status: AC | PRN
  Administered 2016-11-15: 75 mL via INTRAVENOUS

## 2016-11-17 ENCOUNTER — Encounter (HOSPITAL_COMMUNITY)
Admission: RE | Admit: 2016-11-17 | Discharge: 2016-11-17 | Disposition: A | Discharge status: Home / Self Care | Payer: Self-pay | Source: Ambulatory Visit | Attending: Cardiology | Admitting: Cardiology

## 2016-11-20 ENCOUNTER — Encounter (HOSPITAL_COMMUNITY)
Admission: RE | Admit: 2016-11-20 | Discharge: 2016-11-20 | Disposition: A | Discharge status: Home / Self Care | Payer: Self-pay | Source: Ambulatory Visit | Attending: Cardiology | Admitting: Cardiology

## 2016-11-22 ENCOUNTER — Encounter (HOSPITAL_COMMUNITY)
Admission: RE | Admit: 2016-11-22 | Discharge: 2016-11-22 | Disposition: A | Discharge status: Home / Self Care | Payer: Self-pay | Source: Ambulatory Visit | Attending: Cardiology | Admitting: Cardiology

## 2016-11-24 ENCOUNTER — Encounter (HOSPITAL_COMMUNITY)
Admission: RE | Admit: 2016-11-24 | Discharge: 2016-11-24 | Disposition: A | Discharge status: Home / Self Care | Payer: Self-pay | Source: Ambulatory Visit | Attending: Cardiology | Admitting: Cardiology

## 2016-11-27 ENCOUNTER — Encounter (HOSPITAL_COMMUNITY)
Admission: RE | Admit: 2016-11-27 | Discharge: 2016-11-27 | Disposition: A | Discharge status: Home / Self Care | Payer: Self-pay | Source: Ambulatory Visit | Attending: Cardiology | Admitting: Cardiology

## 2016-11-29 ENCOUNTER — Encounter (HOSPITAL_COMMUNITY)
Admission: RE | Admit: 2016-11-29 | Discharge: 2016-11-29 | Disposition: A | Discharge status: Home / Self Care | Payer: Self-pay | Source: Ambulatory Visit | Attending: Cardiology | Admitting: Cardiology

## 2016-12-01 ENCOUNTER — Encounter (HOSPITAL_COMMUNITY)
Admission: RE | Admit: 2016-12-01 | Discharge: 2016-12-01 | Disposition: A | Discharge status: Home / Self Care | Payer: Self-pay | Source: Ambulatory Visit | Attending: Cardiology | Admitting: Cardiology

## 2016-12-01 DIAGNOSIS — Z951 Presence of aortocoronary bypass graft: Secondary | ICD-10-CM | POA: Insufficient documentation

## 2016-12-04 ENCOUNTER — Encounter (HOSPITAL_COMMUNITY)
Admission: RE | Admit: 2016-12-04 | Discharge: 2016-12-04 | Disposition: A | Discharge status: Home / Self Care | Payer: Self-pay | Source: Ambulatory Visit | Attending: Cardiology | Admitting: Cardiology

## 2016-12-06 ENCOUNTER — Ambulatory Visit (INDEPENDENT_AMBULATORY_CARE_PROVIDER_SITE_OTHER): Payer: Medicare Other | Admitting: Surgery

## 2016-12-06 ENCOUNTER — Encounter (HOSPITAL_COMMUNITY): Payer: Self-pay

## 2016-12-06 ENCOUNTER — Other Ambulatory Visit: Payer: Self-pay

## 2016-12-06 VITALS — BP 127/76 | HR 65 | Ht 71.0 in | Wt 155.0 lb

## 2016-12-06 DIAGNOSIS — Z09 Encounter for follow-up examination after completed treatment for conditions other than malignant neoplasm: Secondary | ICD-10-CM

## 2016-12-06 DIAGNOSIS — C3431 Malignant neoplasm of lower lobe, right bronchus or lung: Secondary | ICD-10-CM

## 2016-12-06 DIAGNOSIS — I209 Angina pectoris, unspecified: Secondary | ICD-10-CM

## 2016-12-08 ENCOUNTER — Encounter (HOSPITAL_COMMUNITY)
Admission: RE | Admit: 2016-12-08 | Discharge: 2016-12-08 | Disposition: A | Discharge status: Home / Self Care | Payer: Medicare Other | Source: Ambulatory Visit | Attending: Cardiology | Admitting: Cardiology

## 2016-12-10 ENCOUNTER — Encounter: Payer: Self-pay | Admitting: Surgery

## 2016-12-10 NOTE — Progress Notes (Signed)
HPI:  Patient returns for routine surveillance having undergone right VATS and mini-thoracotomy on 07/10/2016 for wedge resection of a small RLL lung nodule. 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 He has been feeling well and is back to exercising several days per week. He has not noted any cough or shortness of breath. He denies headache or any other neurologic symptoms.    Current Outpatient Medications  Medication Sig Dispense Refill  . aspirin 81 MG tablet Take 81 mg by mouth daily.    Marland Kitchen atorvastatin (LIPITOR) 40 MG tablet ALTERNATE 20 MG ( 1/2 TABLET) AND 40 MG ( 1 TABLET) EVERY OTHER DAY 90 tablet 1  . carvedilol (COREG) 3.125 MG tablet TAKE 2 TABLETS TWICE A DAY 360 tablet 1  . losartan (COZAAR) 25 MG tablet Take 1 tablet (25 mg total) by mouth daily. 90 tablet 2  . sildenafil (VIAGRA) 50 MG tablet Take 1 tablet (50 mg total) by mouth daily as needed for erectile dysfunction. 10 tablet 5  . Vitamin D, Cholecalciferol, 1000 units CAPS Take 1 capsule by mouth daily.     No current facility-administered medications for this visit.      Physical Exam: BP 127/76 (BP Location: Right Arm, Patient Position: Sitting, Cuff Size: Large)   Pulse 65   Ht 5\' 11"  (1.803 m)   Wt 155 lb (70.3 kg)   SpO2 97% Comment: ON RA  BMI 21.62 kg/m  He looks well Cardiac exam shows a regular rate and rhythm with normal heart sounds Lungs are clear There is no cervical or supraclavicular adenopathy The chest incision is well healed.  Diagnostic Tests:  EXAM: CT CHEST WITH CONTRAST  TECHNIQUE: Multidetector CT imaging of the chest was performed during intravenous contrast administration.  CONTRAST:  30mL ISOVUE-300 IOPAMIDOL (ISOVUE-300) INJECTION 61%  COMPARISON:  PET 07/04/2016 and CT chest of 06/13/2016.  FINDINGS: Cardiovascular: Atherosclerotic calcification of the arterial vasculature. Heart size  normal. No pericardial effusion.  Mediastinum/Nodes: No pathologically enlarged mediastinal, hilar or axillary lymph nodes. Esophagus is grossly unremarkable.  Lungs/Pleura: Moderate centrilobular emphysema. Scattered basilar pulmonary parenchymal scarring with postoperative changes in the right lower lobe. Largest pulmonary nodules seen in the right lower lobe on the prior study have been resected or have resolved. 6 mm residual right lower lobe nodule (series 4, image 85), possibly stable but difficult to definitively compare due to architectural distortion on the current study. Calcified granulomas. Additional noncalcified pulmonary nodules in the left lung measure up to the 11 mm in the left lower lobe, stable. No pleural fluid. Airway is unremarkable.  Upper Abdomen: Subcentimeter low-attenuation lesion in the periphery of the right hepatic lobe is too small to characterize. Visualized portions of the liver, gallbladder, adrenal glands, kidneys, spleen, pancreas, stomach and bowel are grossly unremarkable. No upper abdominal adenopathy.  Musculoskeletal: No worrisome lytic or sclerotic lesions. Median sternotomy. Degenerative changes in the spine.  IMPRESSION: 1. Interval resection of a previously seen hypermetabolic right lower lobe nodule. Additional scattered pulmonary nodules are grossly stable. 2.  Aortic atherosclerosis (ICD10-170.0). 3.  Emphysema (ICD10-J43.9).   Electronically Signed   By: Lorin Picket M.D.   On: 11/15/2016 12:46  Impression:  He is recovering well from his surgery and has no evidence of recurrent lung cancer at 5 months postop. I reveiwed the CT scan images with him and his wife and answered their questions.  Plan:  I will plan to see  him back in 6 months with a CT scan of the chest  I spent 15 minutes performing this established patient evaluation and > 50% of this time was spent face to face counseling and coordinating the  surveillance of this patient's lung cancer.  Gaye Pollack, MD [facility] [phone]

## 2016-12-11 ENCOUNTER — Encounter (HOSPITAL_COMMUNITY)
Admission: RE | Admit: 2016-12-11 | Discharge: 2016-12-11 | Disposition: A | Discharge status: Home / Self Care | Payer: Self-pay | Source: Ambulatory Visit | Attending: Cardiology | Admitting: Cardiology

## 2016-12-13 ENCOUNTER — Encounter (HOSPITAL_COMMUNITY)
Admission: RE | Admit: 2016-12-13 | Discharge: 2016-12-13 | Disposition: A | Discharge status: Home / Self Care | Payer: Self-pay | Source: Ambulatory Visit | Attending: Cardiology | Admitting: Cardiology

## 2016-12-15 ENCOUNTER — Encounter (HOSPITAL_COMMUNITY)
Admission: RE | Admit: 2016-12-15 | Discharge: 2016-12-15 | Disposition: A | Discharge status: Home / Self Care | Payer: Medicare Other | Source: Ambulatory Visit | Attending: Cardiology | Admitting: Cardiology

## 2016-12-18 ENCOUNTER — Encounter (HOSPITAL_COMMUNITY): Payer: Self-pay

## 2016-12-20 ENCOUNTER — Encounter (HOSPITAL_COMMUNITY)
Admission: RE | Admit: 2016-12-20 | Discharge: 2016-12-20 | Disposition: A | Discharge status: Home / Self Care | Payer: Self-pay | Source: Ambulatory Visit | Attending: Cardiology | Admitting: Cardiology

## 2016-12-25 ENCOUNTER — Encounter (HOSPITAL_COMMUNITY)
Admission: RE | Admit: 2016-12-25 | Discharge: 2016-12-25 | Disposition: A | Discharge status: Home / Self Care | Payer: Self-pay | Source: Ambulatory Visit | Attending: Cardiology | Admitting: Cardiology

## 2016-12-27 ENCOUNTER — Encounter (HOSPITAL_COMMUNITY): Payer: Self-pay

## 2016-12-29 ENCOUNTER — Encounter (HOSPITAL_COMMUNITY): Payer: Self-pay

## 2017-01-10 ENCOUNTER — Encounter (HOSPITAL_COMMUNITY): Admission: RE | Admit: 2017-01-10 | Payer: Self-pay | Source: Ambulatory Visit

## 2017-01-10 DIAGNOSIS — Z951 Presence of aortocoronary bypass graft: Secondary | ICD-10-CM | POA: Insufficient documentation

## 2017-01-12 ENCOUNTER — Encounter (HOSPITAL_COMMUNITY)
Admission: RE | Admit: 2017-01-12 | Discharge: 2017-01-12 | Disposition: A | Discharge status: Home / Self Care | Payer: Self-pay | Source: Ambulatory Visit | Attending: Cardiology | Admitting: Cardiology

## 2017-01-15 ENCOUNTER — Encounter (HOSPITAL_COMMUNITY)
Admission: RE | Admit: 2017-01-15 | Discharge: 2017-01-15 | Disposition: A | Discharge status: Home / Self Care | Payer: Self-pay | Source: Ambulatory Visit | Attending: Cardiology | Admitting: Cardiology

## 2017-01-17 ENCOUNTER — Encounter (HOSPITAL_COMMUNITY): Payer: Self-pay

## 2017-01-19 ENCOUNTER — Encounter (HOSPITAL_COMMUNITY)
Admission: RE | Admit: 2017-01-19 | Discharge: 2017-01-19 | Disposition: A | Discharge status: Home / Self Care | Payer: Self-pay | Source: Ambulatory Visit | Attending: Cardiology | Admitting: Cardiology

## 2017-01-24 ENCOUNTER — Encounter (HOSPITAL_COMMUNITY): Payer: Self-pay

## 2017-01-26 ENCOUNTER — Encounter (HOSPITAL_COMMUNITY)
Admission: RE | Admit: 2017-01-26 | Discharge: 2017-01-26 | Disposition: A | Discharge status: Home / Self Care | Payer: Self-pay | Source: Ambulatory Visit | Attending: Cardiology | Admitting: Cardiology

## 2017-01-29 ENCOUNTER — Encounter (HOSPITAL_COMMUNITY)
Admission: RE | Admit: 2017-01-29 | Discharge: 2017-01-29 | Disposition: A | Discharge status: Home / Self Care | Payer: Self-pay | Source: Ambulatory Visit | Attending: Cardiology | Admitting: Cardiology

## 2017-01-31 ENCOUNTER — Encounter (HOSPITAL_COMMUNITY)
Admission: RE | Admit: 2017-01-31 | Discharge: 2017-01-31 | Disposition: A | Discharge status: Home / Self Care | Payer: Medicare Other | Source: Ambulatory Visit | Attending: Cardiology | Admitting: Cardiology

## 2017-01-31 DIAGNOSIS — Z951 Presence of aortocoronary bypass graft: Secondary | ICD-10-CM | POA: Insufficient documentation

## 2017-02-02 ENCOUNTER — Encounter (HOSPITAL_COMMUNITY)
Admission: RE | Admit: 2017-02-02 | Discharge: 2017-02-02 | Disposition: A | Discharge status: Home / Self Care | Payer: Medicare Other | Source: Ambulatory Visit | Attending: Cardiology | Admitting: Cardiology

## 2017-02-05 ENCOUNTER — Encounter (HOSPITAL_COMMUNITY)
Admission: RE | Admit: 2017-02-05 | Discharge: 2017-02-05 | Disposition: A | Discharge status: Home / Self Care | Payer: Medicare Other | Source: Ambulatory Visit | Attending: Cardiology | Admitting: Cardiology

## 2017-02-07 ENCOUNTER — Encounter (HOSPITAL_COMMUNITY)
Admission: RE | Admit: 2017-02-07 | Discharge: 2017-02-07 | Disposition: A | Discharge status: Home / Self Care | Payer: Medicare Other | Source: Ambulatory Visit | Attending: Cardiology | Admitting: Cardiology

## 2017-02-09 ENCOUNTER — Encounter (HOSPITAL_COMMUNITY)
Admission: RE | Admit: 2017-02-09 | Discharge: 2017-02-09 | Disposition: A | Discharge status: Home / Self Care | Payer: Medicare Other | Source: Ambulatory Visit | Attending: Cardiology | Admitting: Cardiology

## 2017-02-12 ENCOUNTER — Encounter (HOSPITAL_COMMUNITY): Payer: Self-pay

## 2017-02-14 ENCOUNTER — Encounter (HOSPITAL_COMMUNITY)
Admission: RE | Admit: 2017-02-14 | Discharge: 2017-02-14 | Disposition: A | Discharge status: Home / Self Care | Payer: Medicare Other | Source: Ambulatory Visit | Attending: Cardiology | Admitting: Cardiology

## 2017-02-16 ENCOUNTER — Encounter (HOSPITAL_COMMUNITY)
Admission: RE | Admit: 2017-02-16 | Discharge: 2017-02-16 | Disposition: A | Discharge status: Home / Self Care | Payer: Self-pay | Source: Ambulatory Visit | Attending: Cardiology | Admitting: Cardiology

## 2017-02-19 ENCOUNTER — Encounter (HOSPITAL_COMMUNITY)
Admission: RE | Admit: 2017-02-19 | Discharge: 2017-02-19 | Disposition: A | Discharge status: Home / Self Care | Payer: Self-pay | Source: Ambulatory Visit | Attending: Cardiology | Admitting: Cardiology

## 2017-02-21 ENCOUNTER — Encounter (HOSPITAL_COMMUNITY)
Admission: RE | Admit: 2017-02-21 | Discharge: 2017-02-21 | Disposition: A | Discharge status: Home / Self Care | Payer: Medicare Other | Source: Ambulatory Visit | Attending: Cardiology | Admitting: Cardiology

## 2017-02-23 ENCOUNTER — Encounter (HOSPITAL_COMMUNITY)
Admission: RE | Admit: 2017-02-23 | Discharge: 2017-02-23 | Disposition: A | Discharge status: Home / Self Care | Payer: Medicare Other | Source: Ambulatory Visit | Attending: Cardiology | Admitting: Cardiology

## 2017-02-26 ENCOUNTER — Encounter (HOSPITAL_COMMUNITY)
Admission: RE | Admit: 2017-02-26 | Discharge: 2017-02-26 | Disposition: A | Discharge status: Home / Self Care | Payer: Self-pay | Source: Ambulatory Visit | Attending: Cardiology | Admitting: Cardiology

## 2017-02-28 ENCOUNTER — Encounter (HOSPITAL_COMMUNITY): Payer: Self-pay

## 2017-03-02 ENCOUNTER — Encounter (HOSPITAL_COMMUNITY)
Admission: RE | Admit: 2017-03-02 | Discharge: 2017-03-02 | Disposition: A | Discharge status: Home / Self Care | Payer: Medicare Other | Source: Ambulatory Visit | Attending: Cardiology | Admitting: Cardiology

## 2017-03-02 DIAGNOSIS — Z951 Presence of aortocoronary bypass graft: Secondary | ICD-10-CM | POA: Insufficient documentation

## 2017-03-05 ENCOUNTER — Encounter (HOSPITAL_COMMUNITY): Payer: Self-pay

## 2017-03-07 ENCOUNTER — Encounter (HOSPITAL_COMMUNITY)
Admission: RE | Admit: 2017-03-07 | Discharge: 2017-03-07 | Disposition: A | Discharge status: Home / Self Care | Payer: Medicare Other | Source: Ambulatory Visit | Attending: Cardiology | Admitting: Cardiology

## 2017-03-09 ENCOUNTER — Encounter (HOSPITAL_COMMUNITY): Payer: Self-pay

## 2017-03-12 ENCOUNTER — Encounter (HOSPITAL_COMMUNITY)
Admission: RE | Admit: 2017-03-12 | Discharge: 2017-03-12 | Disposition: A | Discharge status: Home / Self Care | Payer: Self-pay | Source: Ambulatory Visit | Attending: Cardiology | Admitting: Cardiology

## 2017-03-14 ENCOUNTER — Encounter (HOSPITAL_COMMUNITY)
Admission: RE | Admit: 2017-03-14 | Discharge: 2017-03-14 | Disposition: A | Discharge status: Home / Self Care | Payer: Medicare Other | Source: Ambulatory Visit | Attending: Cardiology | Admitting: Cardiology

## 2017-03-16 ENCOUNTER — Encounter (HOSPITAL_COMMUNITY)
Admission: RE | Admit: 2017-03-16 | Discharge: 2017-03-16 | Disposition: A | Discharge status: Home / Self Care | Payer: Self-pay | Source: Ambulatory Visit | Attending: Cardiology | Admitting: Cardiology

## 2017-03-19 ENCOUNTER — Encounter (HOSPITAL_COMMUNITY): Payer: Self-pay

## 2017-03-21 ENCOUNTER — Encounter (HOSPITAL_COMMUNITY): Payer: Self-pay

## 2017-03-23 ENCOUNTER — Encounter (HOSPITAL_COMMUNITY)
Admission: RE | Admit: 2017-03-23 | Discharge: 2017-03-23 | Disposition: A | Discharge status: Home / Self Care | Payer: Medicare Other | Source: Ambulatory Visit | Attending: Cardiology | Admitting: Cardiology

## 2017-03-26 ENCOUNTER — Encounter (HOSPITAL_COMMUNITY): Payer: Self-pay

## 2017-03-26 ENCOUNTER — Encounter: Payer: Self-pay | Admitting: Cardiology

## 2017-03-26 ENCOUNTER — Ambulatory Visit (INDEPENDENT_AMBULATORY_CARE_PROVIDER_SITE_OTHER): Payer: Medicare Other | Admitting: Cardiology

## 2017-03-26 VITALS — BP 110/70 | HR 56 | Ht 71.0 in | Wt 162.2 lb

## 2017-03-26 DIAGNOSIS — E785 Hyperlipidemia, unspecified: Secondary | ICD-10-CM

## 2017-03-26 DIAGNOSIS — I255 Ischemic cardiomyopathy: Secondary | ICD-10-CM

## 2017-03-26 DIAGNOSIS — I209 Angina pectoris, unspecified: Secondary | ICD-10-CM | POA: Diagnosis not present

## 2017-03-26 DIAGNOSIS — I25119 Atherosclerotic heart disease of native coronary artery with unspecified angina pectoris: Secondary | ICD-10-CM | POA: Diagnosis not present

## 2017-03-26 NOTE — Patient Instructions (Addendum)
No changes with current medications   LABS IN AUG 2019 LIPID  CMP WILL MAIL YOU ATHE [place] IN JULY 2019   Your physician wants you to follow-up in AUG 2019 [place].You will receive a reminder letter in the mail two months in advance. If you don't receive a letter, please call our office to schedule the follow-up appointment.    If you need a refill on your cardiac medications before your next appointment, please call your pharmacy.

## 2017-03-26 NOTE — Assessment & Plan Note (Signed)
No active symptoms anymore.  Very he himself is very active with exercise and is not had any recurrent symptoms of angina or dyspnea with exertion since his CABG.  In fact he is tolerated and recovered from VATS lung surgery.

## 2017-03-26 NOTE — Assessment & Plan Note (Addendum)
Multivessel disease on cath, referred for CABG.  1 of her recovery post CABG.  No further issues now.  Has been totally asymptomatic since recovering from CABG.  Remains on low-dose of beta-blocker and ARB and intermittent dosing of atorvastatin. Remains on aspirin.  Plan will be to consider checking first post CABG Myoview in the summer 2020.

## 2017-03-26 NOTE — Progress Notes (Signed)
PCP: [name], MD  Clinic Note: Chief Complaint  Patient presents with  . 6 month visit    no complaints at present  . Coronary Artery Disease    Multivessel CAD-CABG    HPI: Kelly Olson. Kelly Olson") is a 67 y.o. male with a PMH below who presents today for post-op f/u from RLL wedge resection & routine 6 month f/u. 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 Cyndia Bent)  Repeat Echo: EF 50-55%  Continues with Maintenance Program of Cardiac Rehab.  Kelly Olson. was last seen in Aug 2018 -- post-op RLL wedge resection (June 2018 - Dr. Cyndia Bent).  Doing well, almost completely recovered.  NO active cardiac complaints.  Pre-op, was able [facility] >6 [facility]S [facility] any difficulty.  He went for routine screening CT scan this year and they found nodule that had grown in size, so he was referred to Dr. Cyndia Bent who performed VATS - right lower lobe wedge resection.  -- f/u visit in November (Path noted 1.2 cm SCLCA with visceral pleural invasion & negative margins!) - due for recheck scan @ 6 months out (April)   Recent Hospitalizations: - 6/11: s/p R thoracotomy for RLL nodule wedge resection -- Squamous Cell CA.(Dr. Cyndia Bent) - negative margins. - complicated by air leak (Sub Q emphysema - redo chest tube x 2)  Studies Personally Reviewed - (if available, images/films reviewed: From Epic Chart or Care Everywhere)  none  Interval History: "Kelly Olson" presents today for routine 6 month follow-up.  He is still continuing on with his routine [facility] maintenance back ot full activity - gym 2 d / week when not @ [employer].  He is much fully recovered from his surgery now.  Back to his baseline level activity.  If he is not  at the gym for cardiac rehab he is also back playing golf.  Denies any chest tightness or pressure with rest or exertion.  No exertional dyspnea.  Affect is smoker's cough his lower abdomen.  He still has some seasonal allergies with congestion and runny nose/cough, but notably better than in the past. He denies any rapid irregular heartbeats or palpitations.  No syncope/near syncope or TIA/amaurosis fugax.  No melena, hematochezia, hematuria or epistaxis.  No muscle aches or myalgias.  No claudication.  ROS: A comprehensive was performed. Review of Systems  Constitutional: Negative for malaise/fatigue (has essentially fully recovered from his surgery).  HENT: Positive for congestion (Related to allergies.).   Respiratory: Negative for cough, shortness of breath and wheezing.        His smoker's cough is all but resolved now.  Cardiovascular:       Per HPI  Gastrointestinal: Negative for heartburn (depending on what he eats - i.e spicy).  Musculoskeletal: Negative for joint pain (knee pain if he walks  alot).  Endo/Heme/Allergies: Positive for environmental allergies.  Psychiatric/Behavioral: The patient has insomnia.   All other systems reviewed and are negative.  I have reviewed and (if needed) personally updated the patient's problem list, medications, allergies, past medical and surgical history, social and family history.   Past Medical History:  Diagnosis Date  . Coronary artery disease, occlusive 06/02/2014  Multivessel CAD.mLAD-100%, mRCA 99%, dRCA 100%, mLCX 90% And EF 35-45%.  . Former heavy tobacco smoker    Quit in April 2016   . Hyperlipidemia with target LDL less than 70   . Ischemic cardiomyopathy - resolved 05/2014   Myoview: EF ~33% with "infarction vs. severe resting ischemia in LAD & RCA territory; b) EF by Cath: 35-45%. c) post CABG Echo 9016: EF 50-55%  . Lung nodules    right lung lower lobe  . 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 CARDIAC CATHETERIZATION N/A 06/02/2014   Procedure: Left Heart Cath and Coronary Angiography;  Surgeon: Leonie Man, MD;  Location: [facility];  Service: Cardiovascular;  mLAD-100%, mRCA 99%, dRCA 100%, mLCX 90% And EF 35-45%.  . CORONARY ARTERY BYPASS GRAFT N/A 06/04/2014   Procedure: CORONARY ARTERY BYPASS GRAFTING (CABG) times four using bilateral internal mammary arteries and EVH for left  leg saphenous vein;  Surgeon: Gaye Pollack, MD;  Location: [facility];  Service: Open Heart Surgery;  LIMA-OM, RIMA-LAD, SVG-Diag, SVG-rPDA  . NM MYOVIEW LTD  05/28/2014   Pre-CABG:  High Risk Nuclear Stress Test with multivessel distribution ischemia. -- Severely ischemic Cardiomyoapthy with evidence of at least 2 vessel disease & EF of ~33%. Images are consistent with either infarction or severe resting ischemia in the LAD & RCA territory.  . TEE WITHOUT CARDIOVERSION N/A 06/04/2014   Procedure: TRANSESOPHAGEAL ECHOCARDIOGRAM (TEE);  Surgeon: Gaye Pollack, MD;  Location: [facility];  Service: Open Heart Surgery;  Laterality: N/A;  . THORACOTOMY Right 07/10/2016   Procedure: THORACOTOMY;  Surgeon: Gaye Pollack, MD;  Location: [facility];  Service: Thoracic;  Laterality: Right;  . TONSILLECTOMY  1957  . TRANSTHORACIC ECHOCARDIOGRAM  10/2014   Mild concentric LVH. EF 50-55% with mild HK of basal anteroseptal myocardium. GR 1 DD.  Marland Kitchen VIDEO ASSISTED THORACOSCOPY (VATS)/WEDGE RESECTION Right 07/10/2016   Procedure: VIDEO ASSISTED THORACOSCOPY (VATS)/WEDGE RESECTION;  Surgeon: Gaye Pollack, MD;  Location: [facility];  Service: Thoracic;  Laterality: Right;    Current Meds  Medication Sig  . aspirin 81 MG tablet Take 81 mg by mouth daily.  Marland Kitchen atorvastatin (LIPITOR) 40 MG tablet ALTERNATE 20 MG ( 1/2 TABLET) AND 40 MG ( 1 TABLET) EVERY OTHER DAY  . carvedilol (COREG) 3.125 MG tablet TAKE 2 TABLETS TWICE A DAY  . losartan (COZAAR) 25 MG tablet Take 1  tablet (25 mg total) by mouth daily.  . sildenafil (VIAGRA) 50 MG tablet Take 1 tablet (50 mg total) by mouth daily as needed for erectile dysfunction.  . Vitamin D, Cholecalciferol, 1000 units CAPS Take 1 capsule by mouth daily.    Allergies  Allergen Reactions  . No Known Allergies     Social History   Socioeconomic History  . Marital status: Married    Spouse name: None  . Number of children: None  . Years of education: None  . Highest education level: None  Social Needs  . Financial resource strain: None  . Food insecurity - worry: None  . Food insecurity - inability: None  . Transportation needs - medical: None  . Transportation needs - non-medical: None  Occupational History  . None  Tobacco Use  . Smoking status: Former Smoker    Packs/day: 0.80    Years: 46.00    Pack years: 36.80    Types: Cigarettes  Last attempt to quit: 05/29/2014    Years since quitting: 2.8  . Smokeless tobacco: Never Used  Substance and Sexual Activity  . Alcohol use: Yes    Alcohol/week: 8.4 - 12.6 oz    Types: 7 Glasses of wine, 7 - 14 Standard drinks or equivalent per week  . Drug use: No  . Sexual activity: Yes  Other Topics Concern  . None  Social History Narrative   Wife recently diagnosed with early stage breast cancer    family history includes Cardiomyopathy in his father; Emphysema in his mother; Healthy in his sister and sister; Heart attack in his maternal grandfather; Heart disease in his paternal uncle.  Wt Readings from Last 3 Encounters:  03/26/17 162 lb 3.2 oz (73.6 kg)  12/06/16 155 lb (70.3 kg)  09/27/16 161 lb (73 kg)  Feb 154 lb   PHYSICAL EXAM BP 110/70 (BP Location: Left Arm, Patient Position: Sitting, Cuff Size: Normal)   Pulse (!) 56   Ht 5\' 11"  (1.803 m)   Wt 162 lb 3.2 oz (73.6 kg)   BMI 22.62 kg/m  Physical Exam  Constitutional: He is oriented to person, place, and time. He appears well-developed and well-nourished. No distress.  Well-groomed   HENT:  Head: Normocephalic and atraumatic.  Mouth/Throat: Oropharynx is clear and moist.  Mild exophthalmos  Neck: No hepatojugular reflux and no JVD present. Carotid bruit is not present.  Cardiovascular: Normal rate, regular rhythm, normal heart sounds and intact distal pulses. Exam reveals no gallop and no friction rub.  No murmur heard. Pulmonary/Chest: Effort normal and breath sounds normal. No respiratory distress. He has no wheezes. He has no rales.  Abdominal: Soft. Bowel sounds are normal. He exhibits no distension. There is no tenderness. There is no rebound.  Musculoskeletal: Normal range of motion. He exhibits no edema.  Neurological: He is alert and oriented to person, place, and time.  Skin: Skin is warm and dry.  Psychiatric: He has a normal mood and affect. His behavior is normal. Judgment and thought content normal.  Nursing note and vitals reviewed.   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  Very he himself is very active with exercise and is not had any recurrent symptoms of angina or dyspnea with exertion since his CABG.  In fact he is tolerated and recovered from VATS lung surgery.      Relevant Orders   Comprehensive metabolic panel   Cardiomyopathy, ischemic: EF ~35-45% by LV Gram --> 50 and 55% by echo (Chronic)    Amazingly, his cardiopathy pretty much resolved following CABG.  No heart failure symptoms of PND, orthopnea or edema.  Euvolemic.  He is on stable dose of ARB and beta-blocker.      Relevant Orders   Comprehensive metabolic panel   Coronary artery disease involving native  coronary artery with angina pectoris ([place]) (Chronic)    Multivessel disease on cath, referred for CABG.  1 of her recovery post CABG.  No further issues now.  Has been totally asymptomatic since recovering from CABG.  Remains on  low-dose of beta-blocker and ARB and intermittent dosing of atorvastatin. Remains on aspirin.  Plan will be to consider checking first post CABG Myoview in the summer 2020.      Hyperlipidemia with target LDL less than 70 (Chronic)    Due for recheck in August -last August labs look great.  If he continues to be doing well, we could potentially reduce him to 20 mg daily Lipitor versus continue current plan.      Relevant Orders   Lipid panel   Comprehensive metabolic panel      Current medicines are reviewed at length with the patient today. (+/- concerns) none The following changes have been made: none  Patient Instructions  No changes with current medications   LABS IN AUG 2019 LIPID  CMP WILL MAIL YOU ATHE [place] IN JULY 2019   Your physician wants you to follow-up in AUG 2019 [place].You will receive a reminder letter in the mail two months in advance. If you don't receive a letter, please call our office to schedule the follow-up appointment.    If you need a refill on your cardiac medications before your next appointment, please call your pharmacy.    Studies Ordered:   Orders Placed This Encounter  Procedures  . Lipid panel  . Comprehensive metabolic panel      Glenetta Hew, M.D., M.S. Interventional Cardiologist   Pager # [phone] Phone # [phone] [address] [place], [place] [address]

## 2017-03-26 NOTE — Assessment & Plan Note (Signed)
Amazingly, his cardiopathy pretty much resolved following CABG.  No heart failure symptoms of PND, orthopnea or edema.  Euvolemic.  He is on stable dose of ARB and beta-blocker.

## 2017-03-26 NOTE — Assessment & Plan Note (Signed)
Due for recheck in August -last August labs look great.  If he continues to be doing well, we could potentially reduce him to 20 mg daily Lipitor versus continue current plan.

## 2017-03-28 ENCOUNTER — Encounter (HOSPITAL_COMMUNITY)
Admission: RE | Admit: 2017-03-28 | Discharge: 2017-03-28 | Disposition: A | Discharge status: Home / Self Care | Payer: Medicare Other | Source: Ambulatory Visit | Attending: Cardiology | Admitting: Cardiology

## 2017-03-30 ENCOUNTER — Encounter (HOSPITAL_COMMUNITY)
Admission: RE | Admit: 2017-03-30 | Discharge: 2017-03-30 | Disposition: A | Discharge status: Home / Self Care | Payer: Medicare Other | Source: Ambulatory Visit | Attending: Cardiology | Admitting: Cardiology

## 2017-03-30 DIAGNOSIS — Z951 Presence of aortocoronary bypass graft: Secondary | ICD-10-CM | POA: Insufficient documentation

## 2017-04-02 ENCOUNTER — Encounter (HOSPITAL_COMMUNITY)
Admission: RE | Admit: 2017-04-02 | Discharge: 2017-04-02 | Disposition: A | Discharge status: Home / Self Care | Payer: Self-pay | Source: Ambulatory Visit | Attending: Cardiology | Admitting: Cardiology

## 2017-04-04 ENCOUNTER — Encounter (HOSPITAL_COMMUNITY): Payer: Self-pay

## 2017-04-06 ENCOUNTER — Encounter (HOSPITAL_COMMUNITY)
Admission: RE | Admit: 2017-04-06 | Discharge: 2017-04-06 | Disposition: A | Discharge status: Home / Self Care | Payer: Medicare Other | Source: Ambulatory Visit | Attending: Cardiology | Admitting: Cardiology

## 2017-04-09 ENCOUNTER — Encounter (HOSPITAL_COMMUNITY)
Admission: RE | Admit: 2017-04-09 | Discharge: 2017-04-09 | Disposition: A | Discharge status: Home / Self Care | Payer: Medicare Other | Source: Ambulatory Visit | Attending: Cardiology | Admitting: Cardiology

## 2017-04-11 ENCOUNTER — Encounter (HOSPITAL_COMMUNITY)
Admission: RE | Admit: 2017-04-11 | Discharge: 2017-04-11 | Disposition: A | Discharge status: Home / Self Care | Payer: Medicare Other | Source: Ambulatory Visit | Attending: Cardiology | Admitting: Cardiology

## 2017-04-13 ENCOUNTER — Encounter (HOSPITAL_COMMUNITY)
Admission: RE | Admit: 2017-04-13 | Discharge: 2017-04-13 | Disposition: A | Discharge status: Home / Self Care | Payer: Medicare Other | Source: Ambulatory Visit | Attending: Cardiology | Admitting: Cardiology

## 2017-04-16 ENCOUNTER — Encounter (HOSPITAL_COMMUNITY)
Admission: RE | Admit: 2017-04-16 | Discharge: 2017-04-16 | Disposition: A | Discharge status: Home / Self Care | Payer: Medicare Other | Source: Ambulatory Visit | Attending: Cardiology | Admitting: Cardiology

## 2017-04-18 ENCOUNTER — Encounter (HOSPITAL_COMMUNITY)
Admission: RE | Admit: 2017-04-18 | Discharge: 2017-04-18 | Disposition: A | Discharge status: Home / Self Care | Payer: Medicare Other | Source: Ambulatory Visit | Attending: Cardiology | Admitting: Cardiology

## 2017-04-20 ENCOUNTER — Encounter (HOSPITAL_COMMUNITY): Payer: Self-pay

## 2017-04-23 ENCOUNTER — Encounter (HOSPITAL_COMMUNITY): Payer: Self-pay

## 2017-04-25 ENCOUNTER — Encounter (HOSPITAL_COMMUNITY)
Admission: RE | Admit: 2017-04-25 | Discharge: 2017-04-25 | Disposition: A | Discharge status: Home / Self Care | Payer: Medicare Other | Source: Ambulatory Visit | Attending: Cardiology | Admitting: Cardiology

## 2017-04-27 ENCOUNTER — Encounter (HOSPITAL_COMMUNITY)
Admission: RE | Admit: 2017-04-27 | Discharge: 2017-04-27 | Disposition: A | Discharge status: Home / Self Care | Payer: Medicare Other | Source: Ambulatory Visit | Attending: Cardiology | Admitting: Cardiology

## 2017-04-30 ENCOUNTER — Encounter (HOSPITAL_COMMUNITY)
Admission: RE | Admit: 2017-04-30 | Discharge: 2017-04-30 | Disposition: A | Discharge status: Home / Self Care | Payer: Medicare Other | Source: Ambulatory Visit | Attending: Cardiology | Admitting: Cardiology

## 2017-04-30 DIAGNOSIS — Z951 Presence of aortocoronary bypass graft: Secondary | ICD-10-CM | POA: Insufficient documentation

## 2017-05-02 ENCOUNTER — Encounter (HOSPITAL_COMMUNITY)
Admission: RE | Admit: 2017-05-02 | Discharge: 2017-05-02 | Disposition: A | Discharge status: Home / Self Care | Payer: Medicare Other | Source: Ambulatory Visit | Attending: Cardiology | Admitting: Cardiology

## 2017-05-04 ENCOUNTER — Telehealth: Payer: Self-pay | Admitting: Cardiology

## 2017-05-04 ENCOUNTER — Encounter (HOSPITAL_COMMUNITY)
Admission: RE | Admit: 2017-05-04 | Discharge: 2017-05-04 | Disposition: A | Discharge status: Home / Self Care | Payer: Medicare Other | Source: Ambulatory Visit | Attending: Cardiology | Admitting: Cardiology

## 2017-05-04 NOTE — Telephone Encounter (Signed)
New message      *STAT* If patient is at the pharmacy, call can be transferred to refill team.   1. Which medications need to be refilled? (please list name of each medication and dose if known)  carvedilol (COREG) 3.125 MG tablet TAKE 2 TABLETS TWICE A DAY    losartan (COZAAR) 25 MG tablet Take 1 tablet (25 mg total) by mouth daily.        2. Which pharmacy/location (including street and city if local pharmacy) is medication to be sent to? [facility]   3. Do they need a 30 day or 90 day supply? [place]

## 2017-05-07 ENCOUNTER — Encounter (HOSPITAL_COMMUNITY): Payer: Self-pay

## 2017-05-07 MED ORDER — LOSARTAN POTASSIUM 25 MG PO TABS: 25.0000 mg | ORAL_TABLET | Freq: Every day | ORAL | 3 refills | Status: DC

## 2017-05-07 MED ORDER — CARVEDILOL 3.125 MG PO TABS: 6.2500 mg | ORAL_TABLET | Freq: Two times a day (BID) | ORAL | 3 refills | Status: DC

## 2017-05-09 ENCOUNTER — Encounter (HOSPITAL_COMMUNITY)
Admission: RE | Admit: 2017-05-09 | Discharge: 2017-05-09 | Disposition: A | Discharge status: Home / Self Care | Payer: Self-pay | Source: Ambulatory Visit | Attending: Cardiology | Admitting: Cardiology

## 2017-05-11 ENCOUNTER — Encounter (HOSPITAL_COMMUNITY)
Admission: RE | Admit: 2017-05-11 | Discharge: 2017-05-11 | Disposition: A | Discharge status: Home / Self Care | Payer: Self-pay | Source: Ambulatory Visit | Attending: Cardiology | Admitting: Cardiology

## 2017-05-14 ENCOUNTER — Encounter (HOSPITAL_COMMUNITY)
Admission: RE | Admit: 2017-05-14 | Discharge: 2017-05-14 | Disposition: A | Discharge status: Home / Self Care | Payer: Self-pay | Source: Ambulatory Visit | Attending: Cardiology | Admitting: Cardiology

## 2017-05-16 ENCOUNTER — Encounter (HOSPITAL_COMMUNITY)
Admission: RE | Admit: 2017-05-16 | Discharge: 2017-05-16 | Disposition: A | Discharge status: Home / Self Care | Payer: Medicare Other | Source: Ambulatory Visit | Attending: Cardiology | Admitting: Cardiology

## 2017-05-18 ENCOUNTER — Encounter (HOSPITAL_COMMUNITY): Payer: Self-pay

## 2017-05-21 ENCOUNTER — Encounter (HOSPITAL_COMMUNITY): Payer: Self-pay

## 2017-05-22 ENCOUNTER — Other Ambulatory Visit: Payer: Self-pay | Admitting: Surgery

## 2017-05-22 DIAGNOSIS — C3490 Malignant neoplasm of unspecified part of unspecified bronchus or lung: Secondary | ICD-10-CM

## 2017-05-23 ENCOUNTER — Encounter (HOSPITAL_COMMUNITY)
Admission: RE | Admit: 2017-05-23 | Discharge: 2017-05-23 | Disposition: A | Discharge status: Home / Self Care | Payer: Medicare Other | Source: Ambulatory Visit | Attending: Cardiology | Admitting: Cardiology

## 2017-05-25 ENCOUNTER — Encounter (HOSPITAL_COMMUNITY)
Admission: RE | Admit: 2017-05-25 | Discharge: 2017-05-25 | Disposition: A | Discharge status: Home / Self Care | Payer: Medicare Other | Source: Ambulatory Visit | Attending: Cardiology | Admitting: Cardiology

## 2017-05-28 ENCOUNTER — Encounter (HOSPITAL_COMMUNITY): Payer: Self-pay

## 2017-05-30 ENCOUNTER — Encounter (HOSPITAL_COMMUNITY)
Admission: RE | Admit: 2017-05-30 | Discharge: 2017-05-30 | Disposition: A | Discharge status: Home / Self Care | Payer: Medicare Other | Source: Ambulatory Visit | Attending: Cardiology | Admitting: Cardiology

## 2017-05-30 DIAGNOSIS — Z951 Presence of aortocoronary bypass graft: Secondary | ICD-10-CM | POA: Insufficient documentation

## 2017-06-01 ENCOUNTER — Encounter (HOSPITAL_COMMUNITY): Payer: Self-pay

## 2017-06-04 ENCOUNTER — Encounter (HOSPITAL_COMMUNITY): Payer: Self-pay

## 2017-06-06 ENCOUNTER — Ambulatory Visit: Payer: Medicare Other | Admitting: Surgery

## 2017-06-06 ENCOUNTER — Encounter (HOSPITAL_COMMUNITY): Payer: Self-pay

## 2017-06-08 ENCOUNTER — Encounter (HOSPITAL_COMMUNITY): Payer: Self-pay

## 2017-06-11 ENCOUNTER — Encounter (HOSPITAL_COMMUNITY)
Admission: RE | Admit: 2017-06-11 | Discharge: 2017-06-11 | Disposition: A | Discharge status: Home / Self Care | Payer: Medicare Other | Source: Ambulatory Visit | Attending: Cardiology | Admitting: Cardiology

## 2017-06-13 ENCOUNTER — Other Ambulatory Visit: Payer: Self-pay | Admitting: Internal Medicine

## 2017-06-13 ENCOUNTER — Encounter (HOSPITAL_COMMUNITY)
Admission: RE | Admit: 2017-06-13 | Discharge: 2017-06-13 | Disposition: A | Discharge status: Home / Self Care | Payer: Medicare Other | Source: Ambulatory Visit | Attending: Cardiology | Admitting: Cardiology

## 2017-06-13 DIAGNOSIS — E7800 Pure hypercholesterolemia, unspecified: Secondary | ICD-10-CM | POA: Diagnosis not present

## 2017-06-13 DIAGNOSIS — R7301 Impaired fasting glucose: Secondary | ICD-10-CM | POA: Diagnosis not present

## 2017-06-13 DIAGNOSIS — Z136 Encounter for screening for cardiovascular disorders: Secondary | ICD-10-CM

## 2017-06-13 DIAGNOSIS — Z125 Encounter for screening for malignant neoplasm of prostate: Secondary | ICD-10-CM | POA: Diagnosis not present

## 2017-06-13 DIAGNOSIS — Z0000 Encounter for general adult medical examination without abnormal findings: Secondary | ICD-10-CM | POA: Diagnosis not present

## 2017-06-13 DIAGNOSIS — Z86010 Personal history of colonic polyps: Secondary | ICD-10-CM | POA: Diagnosis not present

## 2017-06-13 DIAGNOSIS — N529 Male erectile dysfunction, unspecified: Secondary | ICD-10-CM | POA: Diagnosis not present

## 2017-06-13 DIAGNOSIS — I700 Atherosclerosis of aorta: Secondary | ICD-10-CM | POA: Diagnosis not present

## 2017-06-13 DIAGNOSIS — Z1389 Encounter for screening for other disorder: Secondary | ICD-10-CM | POA: Diagnosis not present

## 2017-06-13 DIAGNOSIS — Z85118 Personal history of other malignant neoplasm of bronchus and lung: Secondary | ICD-10-CM | POA: Diagnosis not present

## 2017-06-13 DIAGNOSIS — I2510 Atherosclerotic heart disease of native coronary artery without angina pectoris: Secondary | ICD-10-CM | POA: Diagnosis not present

## 2017-06-15 ENCOUNTER — Encounter (HOSPITAL_COMMUNITY)
Admission: RE | Admit: 2017-06-15 | Discharge: 2017-06-15 | Disposition: A | Discharge status: Home / Self Care | Payer: Self-pay | Source: Ambulatory Visit | Attending: Cardiology | Admitting: Cardiology

## 2017-06-18 ENCOUNTER — Encounter (HOSPITAL_COMMUNITY)
Admission: RE | Admit: 2017-06-18 | Discharge: 2017-06-18 | Disposition: A | Discharge status: Home / Self Care | Payer: Medicare Other | Source: Ambulatory Visit | Attending: Cardiology | Admitting: Cardiology

## 2017-06-20 ENCOUNTER — Other Ambulatory Visit: Payer: Self-pay

## 2017-06-20 ENCOUNTER — Encounter (HOSPITAL_COMMUNITY): Payer: Self-pay

## 2017-06-20 ENCOUNTER — Ambulatory Visit
Admission: RE | Admit: 2017-06-20 | Discharge: 2017-06-20 | Disposition: A | Discharge status: Home / Self Care | Payer: Medicare Other | Source: Ambulatory Visit | Attending: Surgery | Admitting: Surgery

## 2017-06-20 ENCOUNTER — Ambulatory Visit (INDEPENDENT_AMBULATORY_CARE_PROVIDER_SITE_OTHER): Payer: Medicare Other | Admitting: Surgery

## 2017-06-20 ENCOUNTER — Encounter: Payer: Self-pay | Admitting: Surgery

## 2017-06-20 VITALS — BP 126/78 | HR 64 | Resp 18 | Ht 71.0 in | Wt 161.4 lb

## 2017-06-20 DIAGNOSIS — I209 Angina pectoris, unspecified: Secondary | ICD-10-CM | POA: Diagnosis not present

## 2017-06-20 DIAGNOSIS — C3490 Malignant neoplasm of unspecified part of unspecified bronchus or lung: Secondary | ICD-10-CM

## 2017-06-20 DIAGNOSIS — Z85118 Personal history of other malignant neoplasm of bronchus and lung: Secondary | ICD-10-CM

## 2017-06-20 DIAGNOSIS — R918 Other nonspecific abnormal finding of lung field: Secondary | ICD-10-CM | POA: Diagnosis not present

## 2017-06-20 NOTE — Progress Notes (Signed)
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  He has not noted any cough or shortness of breath. He denies headache or any other neurologic symptoms.     Current Outpatient Medications  Medication Sig Dispense Refill  . aspirin 81 MG tablet Take 81 mg by mouth daily.    Marland Kitchen atorvastatin (LIPITOR) 40 MG tablet ALTERNATE 20 MG ( 1/2 TABLET) AND 40 MG ( 1 TABLET) EVERY OTHER DAY 90 tablet 1  . carvedilol (COREG) 3.125 MG tablet Take 2 tablets (6.25 mg total) by mouth 2 (two) times daily. 360 tablet 3  . losartan (COZAAR) 25 MG tablet Take 1 tablet (25 mg total) by mouth daily. 90 tablet 3  . sildenafil (VIAGRA) 50 MG tablet Take 1 tablet (50 mg total) by mouth daily as needed for erectile dysfunction. 10 tablet 5  . Vitamin D, Cholecalciferol, 1000 units CAPS Take 1 capsule by mouth daily.     No current facility-administered medications for this visit.      Physical Exam: BP 126/78 (BP Location: Right Arm, Patient Position: Sitting, Cuff Size: Normal)   Pulse 64   Resp 18   Ht 5\' 11"  (1.803 m)   Wt 161 lb 6 oz (73.2 kg)   SpO2 97% Comment: RA  BMI 22.51 kg/m  He looks well. There is no cervical or supraclavicular adenopathy. Lung exam is clear. The right chest incisions are healed with no skin lesions.   Diagnostic Tests:  CLINICAL DATA:  Status post right lower lobe wedge resection 07/10/2016 for stage I squamous cell lung carcinoma. Restaging.  EXAM: CT CHEST WITHOUT CONTRAST  TECHNIQUE: Multidetector CT imaging of the chest was performed following the standard protocol without IV contrast.  COMPARISON:  11/15/2016 chest  CT.  FINDINGS: Cardiovascular: Normal heart size. No significant pericardial effusion/thickening. Three-vessel coronary atherosclerosis status post CABG. Atherosclerotic nonaneurysmal thoracic aorta. Normal caliber pulmonary arteries.  Mediastinum/Nodes: Subcentimeter bilateral thyroid lobe hypodense nodules, not definitely changed. Unremarkable esophagus. No pathologically enlarged axillary, mediastinal or hilar lymph nodes, noting limited sensitivity for the detection of hilar adenopathy on this noncontrast study.  Lungs/Pleura: No pneumothorax. No pleural effusion. Status post right lower lobe wedge resection. Moderate to severe centrilobular emphysema with mild diffuse bronchial wall thickening. No acute consolidative airspace disease or lung masses. Stable curvilinear parenchymal bands at the wedge resection site compatible with postsurgical scarring. Numerous (greater than 10) solid pulmonary nodules scattered throughout both lungs measuring up to 1.1 cm in the peripheral left lower lobe (series 8/image 115) are all stable. No new significant pulmonary nodules.  Upper abdomen: Colonic diverticulosis.  Musculoskeletal: No aggressive appearing focal osseous lesions. Intact sternotomy wires. Mild thoracic spondylosis.  IMPRESSION: 1. No evidence of local tumor recurrence at the wedge resection site in the right lower lobe. 2. No findings suspicious for metastatic disease in the chest. 3. Numerous bilateral small solid pulmonary nodules are all stable.  Aortic Atherosclerosis (ICD10-I70.0) and Emphysema (ICD10-J43.9).   Electronically Signed   By: Ilona Sorrel M.D.   On: 06/20/2017 09:27  Impression:  He continues to do well almost 1 year out from his lung cancer surgery.  His CT scan of the chest today shows no evidence  of local tumor recurrence in the right lower lobe and no evidence of metastatic disease in the chest.  There are numerous bilateral small solid  pulmonary nodules which have been present since his initial CT scan of the chest and are unchanged.  These are most likely benign and none of them are hypermetabolic on his initial PET scan.  I reviewed the CT scan images with the patient and answered all of his questions.  Plan:  I will plan to see him back in 6 months with another CT scan of the chest.  If everything remains stable at that point then we can probably start doing a CT scan of the chest on a yearly basis.  I spent 15 minutes performing this established patient evaluation and > 50% of this time was spent face to face counseling and coordinating the care of this patient's bilateral multiple pulmonary nodules and lung cancer surveillance.    Gaye Pollack, MD [facility] [phone]

## 2017-06-22 ENCOUNTER — Encounter (HOSPITAL_COMMUNITY)
Admission: RE | Admit: 2017-06-22 | Discharge: 2017-06-22 | Disposition: A | Discharge status: Home / Self Care | Payer: Medicare Other | Source: Ambulatory Visit | Attending: Cardiology | Admitting: Cardiology

## 2017-06-27 ENCOUNTER — Encounter (HOSPITAL_COMMUNITY): Payer: Self-pay

## 2017-06-28 ENCOUNTER — Ambulatory Visit
Admission: RE | Admit: 2017-06-28 | Discharge: 2017-06-28 | Disposition: A | Discharge status: Home / Self Care | Payer: Medicare Other | Source: Ambulatory Visit | Attending: Internal Medicine | Admitting: Internal Medicine

## 2017-06-28 DIAGNOSIS — Z136 Encounter for screening for cardiovascular disorders: Secondary | ICD-10-CM

## 2017-06-28 DIAGNOSIS — Z87891 Personal history of nicotine dependence: Secondary | ICD-10-CM | POA: Diagnosis not present

## 2017-06-29 ENCOUNTER — Encounter (HOSPITAL_COMMUNITY)
Admission: RE | Admit: 2017-06-29 | Discharge: 2017-06-29 | Disposition: A | Discharge status: Home / Self Care | Payer: Medicare Other | Source: Ambulatory Visit | Attending: Cardiology | Admitting: Cardiology

## 2017-07-02 ENCOUNTER — Encounter (HOSPITAL_COMMUNITY)
Admission: RE | Admit: 2017-07-02 | Discharge: 2017-07-02 | Disposition: A | Discharge status: Home / Self Care | Payer: Medicare Other | Source: Ambulatory Visit | Attending: Cardiology | Admitting: Cardiology

## 2017-07-02 DIAGNOSIS — Z951 Presence of aortocoronary bypass graft: Secondary | ICD-10-CM | POA: Insufficient documentation

## 2017-07-03 DIAGNOSIS — Z86010 Personal history of colonic polyps: Secondary | ICD-10-CM | POA: Diagnosis not present

## 2017-07-03 DIAGNOSIS — Z85118 Personal history of other malignant neoplasm of bronchus and lung: Secondary | ICD-10-CM | POA: Diagnosis not present

## 2017-07-03 DIAGNOSIS — I2510 Atherosclerotic heart disease of native coronary artery without angina pectoris: Secondary | ICD-10-CM | POA: Diagnosis not present

## 2017-07-04 ENCOUNTER — Encounter (HOSPITAL_COMMUNITY): Payer: Self-pay

## 2017-07-06 ENCOUNTER — Encounter (HOSPITAL_COMMUNITY): Payer: Self-pay

## 2017-07-09 ENCOUNTER — Encounter (HOSPITAL_COMMUNITY): Payer: Self-pay

## 2017-07-11 ENCOUNTER — Encounter (HOSPITAL_COMMUNITY)
Admission: RE | Admit: 2017-07-11 | Discharge: 2017-07-11 | Disposition: A | Discharge status: Home / Self Care | Payer: Medicare Other | Source: Ambulatory Visit | Attending: Cardiology | Admitting: Cardiology

## 2017-07-13 ENCOUNTER — Encounter (HOSPITAL_COMMUNITY)
Admission: RE | Admit: 2017-07-13 | Discharge: 2017-07-13 | Disposition: A | Discharge status: Home / Self Care | Payer: Medicare Other | Source: Ambulatory Visit | Attending: Cardiology | Admitting: Cardiology

## 2017-07-16 ENCOUNTER — Encounter (HOSPITAL_COMMUNITY): Payer: Medicare Other

## 2017-07-18 ENCOUNTER — Encounter (HOSPITAL_COMMUNITY)
Admission: RE | Admit: 2017-07-18 | Discharge: 2017-07-18 | Disposition: A | Discharge status: Home / Self Care | Payer: Medicare Other | Source: Ambulatory Visit | Attending: Cardiology | Admitting: Cardiology

## 2017-07-20 ENCOUNTER — Encounter (HOSPITAL_COMMUNITY): Payer: Self-pay

## 2017-07-23 ENCOUNTER — Encounter (HOSPITAL_COMMUNITY)
Admission: RE | Admit: 2017-07-23 | Discharge: 2017-07-23 | Disposition: A | Discharge status: Home / Self Care | Payer: Medicare Other | Source: Ambulatory Visit | Attending: Cardiology | Admitting: Cardiology

## 2017-07-25 ENCOUNTER — Encounter (HOSPITAL_COMMUNITY)
Admission: RE | Admit: 2017-07-25 | Discharge: 2017-07-25 | Disposition: A | Discharge status: Home / Self Care | Payer: Medicare Other | Source: Ambulatory Visit | Attending: Cardiology | Admitting: Cardiology

## 2017-07-27 ENCOUNTER — Encounter (HOSPITAL_COMMUNITY): Payer: Self-pay

## 2017-07-30 ENCOUNTER — Encounter (HOSPITAL_COMMUNITY)
Admission: RE | Admit: 2017-07-30 | Discharge: 2017-07-30 | Disposition: A | Discharge status: Home / Self Care | Payer: Self-pay | Source: Ambulatory Visit | Attending: Cardiology | Admitting: Cardiology

## 2017-07-30 DIAGNOSIS — Z951 Presence of aortocoronary bypass graft: Secondary | ICD-10-CM | POA: Insufficient documentation

## 2017-08-01 ENCOUNTER — Encounter (HOSPITAL_COMMUNITY): Payer: Self-pay

## 2017-08-03 ENCOUNTER — Encounter (HOSPITAL_COMMUNITY)
Admission: RE | Admit: 2017-08-03 | Discharge: 2017-08-03 | Disposition: A | Discharge status: Home / Self Care | Payer: Self-pay | Source: Ambulatory Visit | Attending: Cardiology | Admitting: Cardiology

## 2017-08-06 ENCOUNTER — Encounter (HOSPITAL_COMMUNITY)
Admission: RE | Admit: 2017-08-06 | Discharge: 2017-08-06 | Disposition: A | Discharge status: Home / Self Care | Payer: Self-pay | Source: Ambulatory Visit | Attending: Cardiology | Admitting: Cardiology

## 2017-08-08 ENCOUNTER — Encounter (HOSPITAL_COMMUNITY)
Admission: RE | Admit: 2017-08-08 | Discharge: 2017-08-08 | Disposition: A | Discharge status: Home / Self Care | Payer: Self-pay | Source: Ambulatory Visit | Attending: Cardiology | Admitting: Cardiology

## 2017-08-10 ENCOUNTER — Encounter (HOSPITAL_COMMUNITY)
Admission: RE | Admit: 2017-08-10 | Discharge: 2017-08-10 | Disposition: A | Discharge status: Home / Self Care | Payer: Self-pay | Source: Ambulatory Visit | Attending: Cardiology | Admitting: Cardiology

## 2017-08-13 ENCOUNTER — Encounter (HOSPITAL_COMMUNITY): Payer: Self-pay

## 2017-08-15 ENCOUNTER — Encounter (HOSPITAL_COMMUNITY)
Admission: RE | Admit: 2017-08-15 | Discharge: 2017-08-15 | Disposition: A | Discharge status: Home / Self Care | Payer: Self-pay | Source: Ambulatory Visit | Attending: Cardiology | Admitting: Cardiology

## 2017-08-17 ENCOUNTER — Encounter (HOSPITAL_COMMUNITY)
Admission: RE | Admit: 2017-08-17 | Discharge: 2017-08-17 | Disposition: A | Discharge status: Home / Self Care | Payer: Self-pay | Source: Ambulatory Visit | Attending: Cardiology | Admitting: Cardiology

## 2017-08-20 ENCOUNTER — Encounter (HOSPITAL_COMMUNITY)
Admission: RE | Admit: 2017-08-20 | Discharge: 2017-08-20 | Disposition: A | Discharge status: Home / Self Care | Payer: Self-pay | Source: Ambulatory Visit | Attending: Cardiology | Admitting: Cardiology

## 2017-08-22 ENCOUNTER — Encounter (HOSPITAL_COMMUNITY)
Admission: RE | Admit: 2017-08-22 | Discharge: 2017-08-22 | Disposition: A | Discharge status: Home / Self Care | Payer: Self-pay | Source: Ambulatory Visit | Attending: Cardiology | Admitting: Cardiology

## 2017-08-24 ENCOUNTER — Encounter (HOSPITAL_COMMUNITY)
Admission: RE | Admit: 2017-08-24 | Discharge: 2017-08-24 | Disposition: A | Discharge status: Home / Self Care | Payer: Self-pay | Source: Ambulatory Visit | Attending: Cardiology | Admitting: Cardiology

## 2017-08-27 ENCOUNTER — Encounter (HOSPITAL_COMMUNITY): Payer: Self-pay

## 2017-08-28 DIAGNOSIS — K5730 Diverticulosis of large intestine without perforation or abscess without bleeding: Secondary | ICD-10-CM | POA: Diagnosis not present

## 2017-08-28 DIAGNOSIS — K6389 Other specified diseases of intestine: Secondary | ICD-10-CM | POA: Diagnosis not present

## 2017-08-28 DIAGNOSIS — Z86010 Personal history of colonic polyps: Secondary | ICD-10-CM | POA: Diagnosis not present

## 2017-08-28 DIAGNOSIS — D126 Benign neoplasm of colon, unspecified: Secondary | ICD-10-CM | POA: Diagnosis not present

## 2017-08-29 ENCOUNTER — Encounter (HOSPITAL_COMMUNITY)
Admission: RE | Admit: 2017-08-29 | Discharge: 2017-08-29 | Disposition: A | Discharge status: Home / Self Care | Payer: Self-pay | Source: Ambulatory Visit | Attending: Cardiology | Admitting: Cardiology

## 2017-08-31 ENCOUNTER — Encounter (HOSPITAL_COMMUNITY)
Admission: RE | Admit: 2017-08-31 | Discharge: 2017-08-31 | Disposition: A | Discharge status: Home / Self Care | Payer: Medicare Other | Source: Ambulatory Visit | Attending: Cardiology | Admitting: Cardiology

## 2017-08-31 DIAGNOSIS — Z951 Presence of aortocoronary bypass graft: Secondary | ICD-10-CM | POA: Insufficient documentation

## 2017-08-31 DIAGNOSIS — D126 Benign neoplasm of colon, unspecified: Secondary | ICD-10-CM | POA: Diagnosis not present

## 2017-08-31 DIAGNOSIS — K6389 Other specified diseases of intestine: Secondary | ICD-10-CM | POA: Diagnosis not present

## 2017-09-03 ENCOUNTER — Encounter (HOSPITAL_COMMUNITY)
Admission: RE | Admit: 2017-09-03 | Discharge: 2017-09-03 | Disposition: A | Discharge status: Home / Self Care | Payer: Medicare Other | Source: Ambulatory Visit | Attending: Cardiology | Admitting: Cardiology

## 2017-09-05 ENCOUNTER — Encounter (HOSPITAL_COMMUNITY)
Admission: RE | Admit: 2017-09-05 | Discharge: 2017-09-05 | Disposition: A | Discharge status: Home / Self Care | Payer: Medicare Other | Source: Ambulatory Visit | Attending: Cardiology | Admitting: Cardiology

## 2017-09-07 ENCOUNTER — Encounter (HOSPITAL_COMMUNITY)
Admission: RE | Admit: 2017-09-07 | Discharge: 2017-09-07 | Disposition: A | Discharge status: Home / Self Care | Payer: Self-pay | Source: Ambulatory Visit | Attending: Cardiology | Admitting: Cardiology

## 2017-09-10 ENCOUNTER — Encounter (HOSPITAL_COMMUNITY)
Admission: RE | Admit: 2017-09-10 | Discharge: 2017-09-10 | Disposition: A | Discharge status: Home / Self Care | Payer: Medicare Other | Source: Ambulatory Visit | Attending: Cardiology | Admitting: Cardiology

## 2017-09-12 ENCOUNTER — Encounter (HOSPITAL_COMMUNITY)
Admission: RE | Admit: 2017-09-12 | Discharge: 2017-09-12 | Disposition: A | Discharge status: Home / Self Care | Payer: Medicare Other | Source: Ambulatory Visit | Attending: Cardiology | Admitting: Cardiology

## 2017-09-14 ENCOUNTER — Encounter (HOSPITAL_COMMUNITY)
Admission: RE | Admit: 2017-09-14 | Discharge: 2017-09-14 | Disposition: A | Discharge status: Home / Self Care | Payer: Medicare Other | Source: Ambulatory Visit | Attending: Cardiology | Admitting: Cardiology

## 2017-09-17 ENCOUNTER — Telehealth: Payer: Self-pay | Admitting: *Deleted

## 2017-09-17 ENCOUNTER — Encounter (HOSPITAL_COMMUNITY)
Admission: RE | Admit: 2017-09-17 | Discharge: 2017-09-17 | Disposition: A | Discharge status: Home / Self Care | Payer: Medicare Other | Source: Ambulatory Visit | Attending: Cardiology | Admitting: Cardiology

## 2017-09-17 DIAGNOSIS — I255 Ischemic cardiomyopathy: Secondary | ICD-10-CM

## 2017-09-17 DIAGNOSIS — E785 Hyperlipidemia, unspecified: Secondary | ICD-10-CM

## 2017-09-17 DIAGNOSIS — I209 Angina pectoris, unspecified: Secondary | ICD-10-CM

## 2017-09-17 NOTE — Telephone Encounter (Signed)
Mail letter and labslip  Due sept 2019

## 2017-09-17 NOTE — Telephone Encounter (Signed)
-----   Message from Raiford Simmonds, RN sent at 03/26/2017  8:21 AM EST ----- DUE  09/24/17   WILL MAIL@ 08/23/17  LIPID , CMP

## 2017-09-19 ENCOUNTER — Encounter (HOSPITAL_COMMUNITY)
Admission: RE | Admit: 2017-09-19 | Discharge: 2017-09-19 | Disposition: A | Discharge status: Home / Self Care | Payer: Medicare Other | Source: Ambulatory Visit | Attending: Cardiology | Admitting: Cardiology

## 2017-09-21 ENCOUNTER — Encounter (HOSPITAL_COMMUNITY)
Admission: RE | Admit: 2017-09-21 | Discharge: 2017-09-21 | Disposition: A | Discharge status: Home / Self Care | Payer: Medicare Other | Source: Ambulatory Visit | Attending: Cardiology | Admitting: Cardiology

## 2017-09-24 ENCOUNTER — Encounter (HOSPITAL_COMMUNITY)
Admission: RE | Admit: 2017-09-24 | Discharge: 2017-09-24 | Disposition: A | Discharge status: Home / Self Care | Payer: Medicare Other | Source: Ambulatory Visit | Attending: Cardiology | Admitting: Cardiology

## 2017-09-25 DIAGNOSIS — I255 Ischemic cardiomyopathy: Secondary | ICD-10-CM | POA: Diagnosis not present

## 2017-09-25 DIAGNOSIS — E785 Hyperlipidemia, unspecified: Secondary | ICD-10-CM | POA: Diagnosis not present

## 2017-09-25 DIAGNOSIS — I209 Angina pectoris, unspecified: Secondary | ICD-10-CM | POA: Diagnosis not present

## 2017-09-25 LAB — COMPREHENSIVE METABOLIC PANEL
A/G RATIO: 2 (ref 1.2–2.2)
ALT: 18 IU/L (ref 0–44)
AST: 24 IU/L (ref 0–40)
Albumin: 4.1 g/dL (ref 3.6–4.8)
Alkaline Phosphatase: 75 IU/L (ref 39–117)
BUN/Creatinine Ratio: 18 (ref 10–24)
BUN: 17 mg/dL (ref 8–27)
Bilirubin Total: 0.6 mg/dL (ref 0.0–1.2)
CALCIUM: 9.5 mg/dL (ref 8.6–10.2)
CO2: 26 mmol/L (ref 20–29)
CREATININE: 0.92 mg/dL (ref 0.76–1.27)
Chloride: 102 mmol/L (ref 96–106)
GFR, EST AFRICAN AMERICAN: 99 mL/min/{1.73_m2} (ref >59)
GFR, EST NON AFRICAN AMERICAN: 86 mL/min/{1.73_m2} (ref >59)
Globulin, Total: 2.1 g/dL (ref 1.5–4.5)
Glucose: 93 mg/dL (ref 65–99)
POTASSIUM: 5.4 mmol/L — AB (ref 3.5–5.2)
Sodium: 141 mmol/L (ref 134–144)
TOTAL PROTEIN: 6.2 g/dL (ref 6.0–8.5)

## 2017-09-25 LAB — LIPID PANEL
CHOLESTEROL TOTAL: 134 mg/dL (ref 100–199)
Chol/HDL Ratio: 2.1 ratio (ref 0.0–5.0)
HDL: 64 mg/dL (ref >39)
LDL Calculated: 59 mg/dL (ref 0–99)
TRIGLYCERIDES: 55 mg/dL (ref 0–149)
VLDL Cholesterol Cal: 11 mg/dL (ref 5–40)

## 2017-09-26 ENCOUNTER — Other Ambulatory Visit: Payer: Self-pay

## 2017-09-26 ENCOUNTER — Encounter (HOSPITAL_COMMUNITY): Payer: Self-pay

## 2017-09-26 DIAGNOSIS — E875 Hyperkalemia: Secondary | ICD-10-CM | POA: Diagnosis not present

## 2017-09-27 LAB — BASIC METABOLIC PANEL
BUN/Creatinine Ratio: 18 (ref 10–24)
BUN: 17 mg/dL (ref 8–27)
CALCIUM: 9.3 mg/dL (ref 8.6–10.2)
CO2: 26 mmol/L (ref 20–29)
CREATININE: 0.93 mg/dL (ref 0.76–1.27)
Chloride: 102 mmol/L (ref 96–106)
GFR calc Af Amer: 98 mL/min/{1.73_m2} (ref >59)
GFR calc non Af Amer: 85 mL/min/{1.73_m2} (ref >59)
Glucose: 103 mg/dL — ABNORMAL HIGH (ref 65–99)
POTASSIUM: 4.2 mmol/L (ref 3.5–5.2)
SODIUM: 144 mmol/L (ref 134–144)

## 2017-09-28 ENCOUNTER — Encounter (HOSPITAL_COMMUNITY)
Admission: RE | Admit: 2017-09-28 | Discharge: 2017-09-28 | Disposition: A | Discharge status: Home / Self Care | Payer: Medicare Other | Source: Ambulatory Visit | Attending: Cardiology | Admitting: Cardiology

## 2017-10-03 ENCOUNTER — Encounter: Payer: Self-pay | Admitting: Cardiology

## 2017-10-03 ENCOUNTER — Encounter (HOSPITAL_COMMUNITY): Payer: Self-pay

## 2017-10-03 ENCOUNTER — Ambulatory Visit (INDEPENDENT_AMBULATORY_CARE_PROVIDER_SITE_OTHER): Payer: Medicare Other | Admitting: Cardiology

## 2017-10-03 VITALS — BP 114/64 | HR 61 | Ht 71.0 in | Wt 160.6 lb

## 2017-10-03 DIAGNOSIS — Z87891 Personal history of nicotine dependence: Secondary | ICD-10-CM

## 2017-10-03 DIAGNOSIS — Z951 Presence of aortocoronary bypass graft: Secondary | ICD-10-CM | POA: Insufficient documentation

## 2017-10-03 DIAGNOSIS — I209 Angina pectoris, unspecified: Secondary | ICD-10-CM

## 2017-10-03 DIAGNOSIS — E785 Hyperlipidemia, unspecified: Secondary | ICD-10-CM | POA: Diagnosis not present

## 2017-10-03 DIAGNOSIS — I2510 Atherosclerotic heart disease of native coronary artery without angina pectoris: Secondary | ICD-10-CM

## 2017-10-03 DIAGNOSIS — I255 Ischemic cardiomyopathy: Secondary | ICD-10-CM

## 2017-10-03 MED ORDER — ATORVASTATIN CALCIUM 40 MG PO TABS: 40.0000 mg | ORAL_TABLET | Freq: Every day | ORAL | 3 refills | Status: DC

## 2017-10-03 NOTE — Patient Instructions (Addendum)
MEDICATION  INSTRUCTIONS   INCREASE ATORVASTATIN 40 MG DAILY.   Your physician wants you to follow-up in [place].You will receive a reminder letter in the mail two months in advance. If you don't receive a letter, please call our office to schedule the follow-up appointment.   If you need a refill on your cardiac medications before your next appointment, please call your pharmacy.

## 2017-10-03 NOTE — Assessment & Plan Note (Signed)
Lipids look pretty good, but LDL up 10 points since last yr (notable change in the interim was alternating qod 40 & 20 mg dose of atorvastatin.  Will simply go back to 40 mg daily.

## 2017-10-03 NOTE — Assessment & Plan Note (Signed)
Initial pre-CABG Echo EF 30-35% --> increased post CABG to EF 50-55%.  Mild HK of basal anteroseptal wall.  Gr 1 DD. Improved with revascularization - improved anterior wall function.  Continue low dose Beta Blocker & ARB.

## 2017-10-03 NOTE — Assessment & Plan Note (Addendum)
Doing well post CABG.  No recurrent angina symptoms Is on ASA, Statin, Beta Blocker & ARB at stable doses.  No NTG use.  Plan - initial post-CABG Myoview in summer 2020.

## 2017-10-03 NOTE — Progress Notes (Signed)
PCP: [name], MD  Clinic Note: No chief complaint on file.   HPI: Kelly Olson. Kelly Olson") is a 67 y.o. male with a PMH below who presents today for post-op f/u from RLL wedge resection & routine 6 month f/u. 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 Cyndia Bent)  Repeat Echo: EF 50-55%  Continues with Maintenance [facility] of Cardiac Rehab.  07/10/16: s/p R thoracotomy for RLL nodule wedge resection -- Squamous Cell CA.(Dr. Cyndia Bent) - negative margins. - complicated by air leak (Sub Q emphysema - redo chest tube x 2)  Kelly Olson. was last seen in February 2019--He was doing well.  Still going to maintenance [facility] 3 d/week & exercising on off days.  Recent Hospitalizations: - Studies Personally Reviewed - (if available, images/films reviewed: From Epic Chart or Care Everywhere)  none  Interval History: "Kelly Olson" returns today for six-month follow-up doing quite well without any major issues.  He is still in rehab and gym exercises.  He is also playing golf.  He is to monitor his diet and has never looked back quit smoking cigarettes at the time of his initial diagnosis.  At this point his cigarette cough is pretty much gone and his exertional dyspnea all but gone as he really overexerts. He denies any resting or exertional chest tightness or pressure.  No PND, orthopnea or edema.  No lightheadedness dizziness or wooziness or syncope/near syncope.  No TIA or amaurosis fugax. No melena, hematochezia or hematuria.  No arthralgias or myalgias.  ROS: A comprehensive was performed. Review of Systems  Constitutional: Negative for malaise/fatigue (has essentially fully recovered from his surgery).  HENT:  Positive for congestion (Related to allergies.).   Respiratory: Negative for cough, shortness of breath and wheezing.        His smoker's cough is all but resolved now.  Cardiovascular:       Per HPI  Gastrointestinal: Negative for heartburn (if he eats spicy foods).  Musculoskeletal: Negative for joint pain (knee pain if he walks  alot).  Endo/Heme/Allergies: Positive for environmental allergies.  Psychiatric/Behavioral: The patient has insomnia (but getting better).   All other systems reviewed and are negative.  I have reviewed and (if needed) personally updated the patient's problem list, medications, allergies, past medical and surgical history, social and family history.   Past Medical History:  Diagnosis Date  . Coronary artery disease, occlusive 06/02/2014   Multivessel CAD.mLAD-100%, mRCA 99%, dRCA 100%, mLCX 90% And EF 35-45%.  . Former heavy tobacco smoker    Quit in April 2016   . Hyperlipidemia with target LDL less than 70   . Ischemic cardiomyopathy - resolved 05/2014   Myoview: EF ~33% with "infarction vs. severe resting ischemia in LAD & RCA territory; b) EF by Cath: 35-45%. c) post CABG Echo 9016: EF 50-55%  . Lung nodules    right lung lower lobe  . 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 CARDIAC CATHETERIZATION N/A 06/02/2014   Procedure: Left Heart Cath and Coronary Angiography;  Surgeon: Leonie Man, MD;  Location: [facility];  Service: Cardiovascular;  mLAD-100%, mRCA 99%, dRCA 100%, mLCX 90% And EF 35-45%.  . CORONARY ARTERY BYPASS GRAFT N/A 06/04/2014   Procedure: CORONARY ARTERY BYPASS GRAFTING (CABG) times four using bilateral internal mammary arteries and EVH for left  leg saphenous vein;  Surgeon: Gaye Pollack, MD;  Location: [facility];  Service: Open Heart Surgery;  LIMA-OM, RIMA-LAD, SVG-Diag, SVG-rPDA  . NM MYOVIEW LTD  05/28/2014   Pre-CABG:  High Risk Nuclear  Stress Test with multivessel distribution ischemia. -- Severely ischemic Cardiomyoapthy with evidence of at least 2 vessel disease & EF of ~33%. Images are consistent with either infarction or severe resting ischemia in the LAD & RCA territory.  . TEE WITHOUT CARDIOVERSION N/A 06/04/2014   Procedure: TRANSESOPHAGEAL ECHOCARDIOGRAM (TEE);  Surgeon: Gaye Pollack, MD;  Location: [place];  Service: Open Heart Surgery;  Laterality: N/A;  . THORACOTOMY Right 07/10/2016   Procedure: THORACOTOMY;  Surgeon: Gaye Pollack, MD;  Location: [place];  Service: Thoracic;  Laterality: Right;  . TONSILLECTOMY  1957  . TRANSTHORACIC ECHOCARDIOGRAM  10/2014   Mild concentric LVH. EF 50-55% with mild HK of basal anteroseptal myocardium. GR 1 DD.  Marland Kitchen VIDEO ASSISTED THORACOSCOPY (VATS)/WEDGE RESECTION Right 07/10/2016   Procedure: VIDEO ASSISTED THORACOSCOPY (VATS)/WEDGE RESECTION;  Surgeon: Gaye Pollack, MD;  Location: [facility];  Service: Thoracic;  Laterality: Right;    Current Meds  Medication Sig  . aspirin 81 MG tablet Take 81 mg by mouth daily.  Marland Kitchen atorvastatin (LIPITOR) 40 MG tablet Take 1 tablet (40 mg total) by mouth at bedtime. ALTERNATE 20 MG ( 1/2 TABLET) AND 40 MG ( 1 TABLET) EVERY OTHER DAY  . carvedilol (COREG) 3.125 MG tablet Take 2 tablets (6.25 mg total) by mouth 2 (two) times daily.  Marland Kitchen losartan (COZAAR) 25 MG tablet Take 1 tablet (25 mg total) by mouth daily.  . sildenafil (VIAGRA) 50 MG tablet Take 1 tablet (50 mg total) by mouth daily as needed for erectile dysfunction.  . Vitamin D, Cholecalciferol, 1000 units CAPS Take 1 capsule by mouth daily.  . [DISCONTINUED] atorvastatin (LIPITOR) 40 MG tablet ALTERNATE 20 MG ( 1/2 TABLET) AND 40 MG ( 1 TABLET) EVERY OTHER DAY    Allergies  Allergen Reactions  . No Known Allergies    Social History   Tobacco Use  . Smoking status: Former Smoker    Packs/day: 0.80    Years: 46.00    Pack years: 36.80    Types: Cigarettes    Last attempt to quit:  05/29/2014    Years since quitting: 3.3  . Smokeless tobacco: Never Used  Substance Use Topics  . Alcohol use: Yes    Alcohol/week: 14.0 - 21.0 standard drinks    Types: 7 Glasses of wine, 7 - 14 Standard drinks or equivalent per week  . Drug use: No   Social History   Social History Narrative   Wife recently diagnosed with early stage breast cancer   Family History family history includes Cardiomyopathy in his father; Emphysema in his mother; Healthy in his sister and sister; Heart attack in his maternal grandfather; Heart disease in his paternal uncle.  Wt Readings from Last 3 Encounters:  10/03/17 160 lb 9.6 oz (72.8 kg)  06/20/17 161 lb 6 oz (73.2 kg)  03/26/17 162 lb 3.2 oz (73.6 kg)    PHYSICAL EXAM BP 114/64   Pulse 61   Ht 5\' 11"  (1.803 m)   Wt 160 lb 9.6 oz (72.8  kg)   BMI 22.40 kg/m  Physical Exam  Constitutional: He is oriented to person, place, and time. He appears well-developed and well-nourished. No distress.  Well-groomed  HENT:  Head: Normocephalic and atraumatic.  Mouth/Throat: Oropharynx is clear and moist.  Mild exophthalmos  Neck: Normal range of motion. Neck supple. No hepatojugular reflux and no JVD present. Carotid bruit is not present.  Cardiovascular: Normal rate, regular rhythm, normal heart sounds and intact distal pulses.  No extrasystoles are present. PMI is not displaced. Exam reveals no gallop and no friction rub.  No murmur heard. Pulmonary/Chest: Effort normal and breath sounds normal. No respiratory distress. He has no wheezes. He has no rales.  Abdominal: Soft. Bowel sounds are normal. He exhibits no distension. There is no tenderness. There is no rebound.  No HSM  Musculoskeletal: Normal range of motion. He exhibits no edema.  Neurological: He is alert and oriented to person, place, and time.  Psychiatric: He has a normal mood and affect. His behavior is normal. Judgment and thought content normal.  Nursing note and vitals  reviewed.   Adult ECG Report NSR - rate 61 bpm.  Normal axis, intervals & durations. -- Normal EKG  Other studies Reviewed: Additional studies/ records that were reviewed today include:  Recent Labs:   Lab Results  Component Value Date   CHOL 134 09/25/2017   HDL 64 09/25/2017   LDLCALC 59 09/25/2017   TRIG 55 09/25/2017   CHOLHDL 2.1 09/25/2017    ASSESSMENT / PLAN: Problem List Items Addressed This Visit    Cardiomyopathy, ischemic: EF ~35-45% by LV Gram --> 50 and 55% by echo (Chronic)    Initial pre-CABG Echo EF 30-35% --> increased post CABG to EF 50-55%.  Mild HK of basal anteroseptal wall.  Gr 1 DD. Improved with revascularization - improved anterior wall function.  Continue low dose Beta Blocker & ARB.      Relevant Medications   atorvastatin (LIPITOR) 40 MG tablet   Other Relevant Orders   EKG 12-Lead   Coronary artery disease involving native coronary artery without angina pectoris - Primary (Chronic)    Doing well post CABG.  No recurrent angina symptoms Is on ASA, Statin, Beta Blocker & ARB at stable doses.  No NTG use.  Plan - initial post-CABG Myoview in summer 2020.      Relevant Medications   atorvastatin (LIPITOR) 40 MG tablet   Other Relevant Orders   EKG 12-Lead   Former heavy tobacco smoker - quit when he had diagnoses of CAD (Chronic)    3 yrs out from last cigarette prior to cath-cabg.  Congratulated his efforts.      Hyperlipidemia with target LDL less than 70 (Chronic)    Lipids look pretty good, but LDL up 10 points since last yr (notable change in the interim was alternating qod 40 & 20 mg dose of atorvastatin.  Will simply go back to 40 mg daily.      Relevant Medications   atorvastatin (LIPITOR) 40 MG tablet      Current medicines are reviewed at length with the patient today. (+/- concerns) none The following changes have been made: none  Patient Instructions  MEDICATION  INSTRUCTIONS   INCREASE ATORVASTATIN 40 MG  DAILY.    Your physician wants you to follow-up in [place].You will receive a reminder letter in the mail two months in advance. If you don't receive a letter, please call our office to schedule the follow-up appointment.  If you need a refill on your cardiac medications before your next appointment, please call your pharmacy.    Studies Ordered:   Orders Placed This Encounter  Procedures  . EKG 12-Lead     Glenetta Hew, M.D., M.S. Interventional Cardiologist   Pager # [phone] Phone # [phone] [address] [place],  [address]

## 2017-10-03 NOTE — Assessment & Plan Note (Signed)
3 yrs out from last cigarette prior to cath-cabg.  Congratulated his efforts.

## 2017-10-05 ENCOUNTER — Encounter (HOSPITAL_COMMUNITY)
Admission: RE | Admit: 2017-10-05 | Discharge: 2017-10-05 | Disposition: A | Discharge status: Home / Self Care | Payer: Medicare Other | Source: Ambulatory Visit | Attending: Cardiology | Admitting: Cardiology

## 2017-10-08 ENCOUNTER — Encounter (HOSPITAL_COMMUNITY)
Admission: RE | Admit: 2017-10-08 | Discharge: 2017-10-08 | Disposition: A | Discharge status: Home / Self Care | Payer: Self-pay | Source: Ambulatory Visit | Attending: Cardiology | Admitting: Cardiology

## 2017-10-09 ENCOUNTER — Telehealth: Payer: Self-pay | Admitting: Cardiology

## 2017-10-09 NOTE — Telephone Encounter (Signed)
° ° °  Pt c/o medication issue:  1. Name of Medication:  atorvastatin (LIPITOR) 40 MG tablet 2. How are you currently taking this medication (dosage and times per day)?   3. Are you having a reaction (difficulty breathing--STAT)? no  4. What is your medication issue? Thomas from [employer] requesting clarification on directions. Please call [phone]

## 2017-10-09 NOTE — Telephone Encounter (Signed)
Called pharmacy, clarified from previous office visit to increase to Atorvastatin to 40 mg daily.   Pharmacy had no questions.

## 2017-10-10 ENCOUNTER — Encounter (HOSPITAL_COMMUNITY)
Admission: RE | Admit: 2017-10-10 | Discharge: 2017-10-10 | Disposition: A | Discharge status: Home / Self Care | Payer: Medicare Other | Source: Ambulatory Visit | Attending: Cardiology | Admitting: Cardiology

## 2017-10-12 ENCOUNTER — Encounter (HOSPITAL_COMMUNITY)
Admission: RE | Admit: 2017-10-12 | Discharge: 2017-10-12 | Disposition: A | Discharge status: Home / Self Care | Payer: Medicare Other | Source: Ambulatory Visit | Attending: Cardiology | Admitting: Cardiology

## 2017-10-15 ENCOUNTER — Encounter (HOSPITAL_COMMUNITY): Payer: Self-pay

## 2017-10-17 ENCOUNTER — Encounter (HOSPITAL_COMMUNITY)
Admission: RE | Admit: 2017-10-17 | Discharge: 2017-10-17 | Disposition: A | Discharge status: Home / Self Care | Payer: Medicare Other | Source: Ambulatory Visit | Attending: Cardiology | Admitting: Cardiology

## 2017-10-19 ENCOUNTER — Encounter (HOSPITAL_COMMUNITY)
Admission: RE | Admit: 2017-10-19 | Discharge: 2017-10-19 | Disposition: A | Discharge status: Home / Self Care | Payer: Self-pay | Source: Ambulatory Visit | Attending: Cardiology | Admitting: Cardiology

## 2017-10-22 ENCOUNTER — Encounter (HOSPITAL_COMMUNITY)
Admission: RE | Admit: 2017-10-22 | Discharge: 2017-10-22 | Disposition: A | Discharge status: Home / Self Care | Payer: Medicare Other | Source: Ambulatory Visit | Attending: Cardiology | Admitting: Cardiology

## 2017-10-24 ENCOUNTER — Encounter (HOSPITAL_COMMUNITY): Admission: RE | Admit: 2017-10-24 | Payer: Self-pay | Source: Ambulatory Visit

## 2017-10-26 ENCOUNTER — Encounter (HOSPITAL_COMMUNITY): Payer: Self-pay

## 2017-10-29 ENCOUNTER — Encounter (HOSPITAL_COMMUNITY): Payer: Self-pay

## 2017-10-29 DIAGNOSIS — Z23 Encounter for immunization: Secondary | ICD-10-CM | POA: Diagnosis not present

## 2017-10-31 ENCOUNTER — Encounter (HOSPITAL_COMMUNITY): Payer: Self-pay

## 2017-10-31 DIAGNOSIS — Z951 Presence of aortocoronary bypass graft: Secondary | ICD-10-CM | POA: Insufficient documentation

## 2017-11-02 ENCOUNTER — Other Ambulatory Visit: Payer: Self-pay | Admitting: *Deleted

## 2017-11-02 ENCOUNTER — Encounter (HOSPITAL_COMMUNITY): Payer: Self-pay

## 2017-11-02 DIAGNOSIS — Z85118 Personal history of other malignant neoplasm of bronchus and lung: Secondary | ICD-10-CM

## 2017-11-02 DIAGNOSIS — C3431 Malignant neoplasm of lower lobe, right bronchus or lung: Secondary | ICD-10-CM

## 2017-11-05 ENCOUNTER — Encounter (HOSPITAL_COMMUNITY): Payer: Self-pay

## 2017-11-07 ENCOUNTER — Encounter (HOSPITAL_COMMUNITY): Payer: Self-pay

## 2017-11-09 ENCOUNTER — Encounter (HOSPITAL_COMMUNITY): Payer: Self-pay

## 2017-11-12 ENCOUNTER — Encounter (HOSPITAL_COMMUNITY): Payer: Self-pay

## 2017-11-14 ENCOUNTER — Encounter (HOSPITAL_COMMUNITY)
Admission: RE | Admit: 2017-11-14 | Discharge: 2017-11-14 | Disposition: A | Discharge status: Home / Self Care | Payer: Medicare Other | Source: Ambulatory Visit | Attending: Cardiology | Admitting: Cardiology

## 2017-11-16 ENCOUNTER — Encounter (HOSPITAL_COMMUNITY)
Admission: RE | Admit: 2017-11-16 | Discharge: 2017-11-16 | Disposition: A | Discharge status: Home / Self Care | Payer: Medicare Other | Source: Ambulatory Visit | Attending: Cardiology | Admitting: Cardiology

## 2017-11-19 ENCOUNTER — Encounter (HOSPITAL_COMMUNITY)
Admission: RE | Admit: 2017-11-19 | Discharge: 2017-11-19 | Disposition: A | Discharge status: Home / Self Care | Payer: Medicare Other | Source: Ambulatory Visit | Attending: Cardiology | Admitting: Cardiology

## 2017-11-21 ENCOUNTER — Encounter (HOSPITAL_COMMUNITY)
Admission: RE | Admit: 2017-11-21 | Discharge: 2017-11-21 | Disposition: A | Discharge status: Home / Self Care | Payer: Medicare Other | Source: Ambulatory Visit | Attending: Cardiology | Admitting: Cardiology

## 2017-11-23 ENCOUNTER — Encounter (HOSPITAL_COMMUNITY)
Admission: RE | Admit: 2017-11-23 | Discharge: 2017-11-23 | Disposition: A | Discharge status: Home / Self Care | Payer: Medicare Other | Source: Ambulatory Visit | Attending: Cardiology | Admitting: Cardiology

## 2017-11-26 ENCOUNTER — Encounter (HOSPITAL_COMMUNITY): Payer: Self-pay

## 2017-11-28 ENCOUNTER — Encounter (HOSPITAL_COMMUNITY): Payer: Self-pay

## 2017-11-30 ENCOUNTER — Encounter (HOSPITAL_COMMUNITY)
Admission: RE | Admit: 2017-11-30 | Discharge: 2017-11-30 | Disposition: A | Discharge status: Home / Self Care | Payer: Medicare Other | Source: Ambulatory Visit | Attending: Cardiology | Admitting: Cardiology

## 2017-11-30 DIAGNOSIS — Z951 Presence of aortocoronary bypass graft: Secondary | ICD-10-CM | POA: Insufficient documentation

## 2017-12-03 ENCOUNTER — Encounter (HOSPITAL_COMMUNITY)
Admission: RE | Admit: 2017-12-03 | Discharge: 2017-12-03 | Disposition: A | Discharge status: Home / Self Care | Payer: Medicare Other | Source: Ambulatory Visit | Attending: Cardiology | Admitting: Cardiology

## 2017-12-05 ENCOUNTER — Encounter (HOSPITAL_COMMUNITY): Payer: Self-pay

## 2017-12-07 ENCOUNTER — Encounter (HOSPITAL_COMMUNITY)
Admission: RE | Admit: 2017-12-07 | Discharge: 2017-12-07 | Disposition: A | Discharge status: Home / Self Care | Payer: Medicare Other | Source: Ambulatory Visit | Attending: Cardiology | Admitting: Cardiology

## 2017-12-10 ENCOUNTER — Encounter (HOSPITAL_COMMUNITY)
Admission: RE | Admit: 2017-12-10 | Discharge: 2017-12-10 | Disposition: A | Discharge status: Home / Self Care | Payer: Medicare Other | Source: Ambulatory Visit | Attending: Cardiology | Admitting: Cardiology

## 2017-12-12 ENCOUNTER — Encounter: Payer: Self-pay | Admitting: Surgery

## 2017-12-12 ENCOUNTER — Ambulatory Visit
Admission: RE | Admit: 2017-12-12 | Discharge: 2017-12-12 | Disposition: A | Discharge status: Home / Self Care | Payer: Medicare Other | Source: Ambulatory Visit | Attending: Surgery | Admitting: Surgery

## 2017-12-12 ENCOUNTER — Encounter (HOSPITAL_COMMUNITY): Payer: Self-pay

## 2017-12-12 ENCOUNTER — Other Ambulatory Visit: Payer: Self-pay

## 2017-12-12 ENCOUNTER — Ambulatory Visit (INDEPENDENT_AMBULATORY_CARE_PROVIDER_SITE_OTHER): Payer: Medicare Other | Admitting: Surgery

## 2017-12-12 VITALS — BP 120/66 | HR 61 | Resp 16 | Ht 71.0 in | Wt 155.0 lb

## 2017-12-12 DIAGNOSIS — Z09 Encounter for follow-up examination after completed treatment for conditions other than malignant neoplasm: Secondary | ICD-10-CM | POA: Diagnosis not present

## 2017-12-12 DIAGNOSIS — C3431 Malignant neoplasm of lower lobe, right bronchus or lung: Secondary | ICD-10-CM | POA: Diagnosis not present

## 2017-12-12 DIAGNOSIS — I209 Angina pectoris, unspecified: Secondary | ICD-10-CM

## 2017-12-12 DIAGNOSIS — R918 Other nonspecific abnormal finding of lung field: Secondary | ICD-10-CM | POA: Diagnosis not present

## 2017-12-12 DIAGNOSIS — Z9889 Other specified postprocedural states: Secondary | ICD-10-CM

## 2017-12-12 DIAGNOSIS — Z85118 Personal history of other malignant neoplasm of bronchus and lung: Secondary | ICD-10-CM

## 2017-12-12 NOTE — Progress Notes (Signed)
HPI:  Patient returns for routinesurveillancehaving undergoneright VATS and mini-thoracotomyon 07/10/2016 for wedge resection of a small RLL lung nodule.The pathology showed a 1.2 cm squamous cell carcinoma with visceral pleural invasion and negative surgical margins.He has been feeling well and is exercising almost every day during the week either at cardiac rehab or the gym.  He has not noted any cough or shortness of breath. He denies headache or any other neurologic symptoms.  Current Outpatient Medications  Medication Sig Dispense Refill  . aspirin 81 MG tablet Take 81 mg by mouth daily.    Marland Kitchen atorvastatin (LIPITOR) 40 MG tablet Take 40 mg by mouth daily.    . carvedilol (COREG) 3.125 MG tablet Take 2 tablets (6.25 mg total) by mouth 2 (two) times daily. 360 tablet 3  . losartan (COZAAR) 25 MG tablet Take 1 tablet (25 mg total) by mouth daily. 90 tablet 3  . sildenafil (VIAGRA) 50 MG tablet Take 1 tablet (50 mg total) by mouth daily as needed for erectile dysfunction. 10 tablet 5  . Vitamin D, Cholecalciferol, 1000 units CAPS Take 1 capsule by mouth daily.     No current facility-administered medications for this visit.      Physical Exam: BP 120/66 (BP Location: Left Arm, Patient Position: Sitting, Cuff Size: Large)   Pulse 61   Resp 16   Ht 5\' 11"  (1.803 m)   Wt 155 lb (70.3 kg)   SpO2 97% Comment: ON RA  BMI 21.62 kg/m  He looks well. There is no cervical or supraclavicular adenopathy. Lung exam is clear. The right chest scars look fine. Cardiac exam shows a regular rate and rhythm with normal heart sounds.  Diagnostic Tests:  CLINICAL DATA:  Follow-up lung nodule  EXAM: CT CHEST WITHOUT CONTRAST  TECHNIQUE: Multidetector CT imaging of the chest was performed following the standard protocol without IV contrast.  COMPARISON:  06/20/2017, remote studies dating back to 06/08/2015.  FINDINGS: Cardiovascular: Prior CABG. Heart is normal size. Moderate  scattered aortic calcifications. No evidence of aortic aneurysm.  Mediastinum/Nodes: No mediastinal, hilar, or axillary adenopathy.  Lungs/Pleura: Moderate to severe centrilobular emphysema. Postoperative changes from wedge resection in the right lower lobe with scarring. Adjacent right lower lobe nodule measures 4-5 mm, stable. Multiple nodules scattered elsewhere throughout the lungs, the largest in the left lower lobe measuring 11 mm, stable. These are all stable dating back to 06/08/2015 compatible with benign nodules. No new or enlarging pulmonary nodules. No effusions.  Upper Abdomen: Imaging into the upper abdomen shows no acute findings.  Musculoskeletal: Chest wall soft tissues are unremarkable. No acute bony abnormality.  IMPRESSION: Postoperative changes in the right lower lobe with residual scarring.  Numerous bilateral pulmonary nodules stable dating back to May 2017 compatible with benign nodules.  Prior CABG.  Aortic Atherosclerosis (ICD10-I70.0) and Emphysema (ICD10-J43.9).   Electronically Signed   By: Rolm Baptise M.D.   On: 12/12/2017 09:52   Impression:  I have personally reviewed his CT scan of the chest done today and reviewed the images with him.  There are postoperative changes in the right lower lobe from wedge resection with residual scarring but no evidence of recurrent or metastatic lung cancer.  There are numerous bilateral small pulmonary nodules that are all stable dating back to May 2017 compatible with benign nodules.  He is now 1 1/2 years out from his lung cancer resection.  Plan:  I will plan to see him back in 6 months with a  CT scan of the chest for lung cancer surveillance.  I spent 15 minutes performing this established patient evaluation and > 50% of this time was spent face to face counseling and coordinating the surveillance of this patient's resected lung cancer.   Gaye Pollack, MD [facility] [phone]

## 2017-12-14 ENCOUNTER — Encounter (HOSPITAL_COMMUNITY)
Admission: RE | Admit: 2017-12-14 | Discharge: 2017-12-14 | Disposition: A | Discharge status: Home / Self Care | Payer: Self-pay | Source: Ambulatory Visit | Attending: Cardiology | Admitting: Cardiology

## 2017-12-17 ENCOUNTER — Encounter (HOSPITAL_COMMUNITY)
Admission: RE | Admit: 2017-12-17 | Discharge: 2017-12-17 | Disposition: A | Discharge status: Home / Self Care | Payer: Self-pay | Source: Ambulatory Visit | Attending: Cardiology | Admitting: Cardiology

## 2017-12-19 ENCOUNTER — Encounter (HOSPITAL_COMMUNITY): Payer: Self-pay

## 2017-12-21 ENCOUNTER — Encounter (HOSPITAL_COMMUNITY)
Admission: RE | Admit: 2017-12-21 | Discharge: 2017-12-21 | Disposition: A | Discharge status: Home / Self Care | Payer: Medicare Other | Source: Ambulatory Visit | Attending: Cardiology | Admitting: Cardiology

## 2017-12-24 ENCOUNTER — Encounter (HOSPITAL_COMMUNITY)
Admission: RE | Admit: 2017-12-24 | Discharge: 2017-12-24 | Disposition: A | Discharge status: Home / Self Care | Payer: Self-pay | Source: Ambulatory Visit | Attending: Cardiology | Admitting: Cardiology

## 2017-12-26 ENCOUNTER — Encounter (HOSPITAL_COMMUNITY)
Admission: RE | Admit: 2017-12-26 | Discharge: 2017-12-26 | Disposition: A | Discharge status: Home / Self Care | Payer: Self-pay | Source: Ambulatory Visit | Attending: Cardiology | Admitting: Cardiology

## 2017-12-31 ENCOUNTER — Encounter (HOSPITAL_COMMUNITY)
Admission: RE | Admit: 2017-12-31 | Discharge: 2017-12-31 | Disposition: A | Discharge status: Home / Self Care | Payer: Medicare Other | Source: Ambulatory Visit | Attending: Cardiology | Admitting: Cardiology

## 2017-12-31 DIAGNOSIS — Z951 Presence of aortocoronary bypass graft: Secondary | ICD-10-CM | POA: Insufficient documentation

## 2018-01-02 ENCOUNTER — Encounter (HOSPITAL_COMMUNITY)
Admission: RE | Admit: 2018-01-02 | Discharge: 2018-01-02 | Disposition: A | Discharge status: Home / Self Care | Payer: Medicare Other | Source: Ambulatory Visit | Attending: Cardiology | Admitting: Cardiology

## 2018-01-04 ENCOUNTER — Encounter (HOSPITAL_COMMUNITY)
Admission: RE | Admit: 2018-01-04 | Discharge: 2018-01-04 | Disposition: A | Discharge status: Home / Self Care | Payer: Medicare Other | Source: Ambulatory Visit | Attending: Cardiology | Admitting: Cardiology

## 2018-01-07 ENCOUNTER — Encounter (HOSPITAL_COMMUNITY)
Admission: RE | Admit: 2018-01-07 | Discharge: 2018-01-07 | Disposition: A | Discharge status: Home / Self Care | Payer: Medicare Other | Source: Ambulatory Visit | Attending: Cardiology | Admitting: Cardiology

## 2018-01-09 ENCOUNTER — Encounter (HOSPITAL_COMMUNITY): Payer: Self-pay

## 2018-01-11 ENCOUNTER — Encounter (HOSPITAL_COMMUNITY)
Admission: RE | Admit: 2018-01-11 | Discharge: 2018-01-11 | Disposition: A | Discharge status: Home / Self Care | Payer: Medicare Other | Source: Ambulatory Visit | Attending: Cardiology | Admitting: Cardiology

## 2018-01-14 ENCOUNTER — Encounter (HOSPITAL_COMMUNITY)
Admission: RE | Admit: 2018-01-14 | Discharge: 2018-01-14 | Disposition: A | Discharge status: Home / Self Care | Payer: Medicare Other | Source: Ambulatory Visit | Attending: Cardiology | Admitting: Cardiology

## 2018-01-16 ENCOUNTER — Encounter (HOSPITAL_COMMUNITY)
Admission: RE | Admit: 2018-01-16 | Discharge: 2018-01-16 | Disposition: A | Discharge status: Home / Self Care | Payer: Self-pay | Source: Ambulatory Visit | Attending: Cardiology | Admitting: Cardiology

## 2018-01-18 ENCOUNTER — Encounter (HOSPITAL_COMMUNITY): Payer: Self-pay

## 2018-01-21 ENCOUNTER — Encounter (HOSPITAL_COMMUNITY)
Admission: RE | Admit: 2018-01-21 | Discharge: 2018-01-21 | Disposition: A | Discharge status: Home / Self Care | Payer: Self-pay | Source: Ambulatory Visit | Attending: Cardiology | Admitting: Cardiology

## 2018-01-25 ENCOUNTER — Encounter (HOSPITAL_COMMUNITY)
Admission: RE | Admit: 2018-01-25 | Discharge: 2018-01-25 | Disposition: A | Discharge status: Home / Self Care | Payer: Medicare Other | Source: Ambulatory Visit | Attending: Cardiology | Admitting: Cardiology

## 2018-01-28 ENCOUNTER — Encounter (HOSPITAL_COMMUNITY)
Admission: RE | Admit: 2018-01-28 | Discharge: 2018-01-28 | Disposition: A | Discharge status: Home / Self Care | Payer: Medicare Other | Source: Ambulatory Visit | Attending: Cardiology | Admitting: Cardiology

## 2018-02-01 ENCOUNTER — Encounter (HOSPITAL_COMMUNITY)
Admission: RE | Admit: 2018-02-01 | Discharge: 2018-02-01 | Disposition: A | Discharge status: Home / Self Care | Payer: Self-pay | Source: Ambulatory Visit | Attending: Cardiology | Admitting: Cardiology

## 2018-02-01 DIAGNOSIS — Z951 Presence of aortocoronary bypass graft: Secondary | ICD-10-CM | POA: Insufficient documentation

## 2018-02-01 DIAGNOSIS — I25119 Atherosclerotic heart disease of native coronary artery with unspecified angina pectoris: Secondary | ICD-10-CM | POA: Insufficient documentation

## 2018-02-04 ENCOUNTER — Encounter (HOSPITAL_COMMUNITY)
Admission: RE | Admit: 2018-02-04 | Discharge: 2018-02-04 | Disposition: A | Discharge status: Home / Self Care | Payer: Self-pay | Source: Ambulatory Visit | Attending: Cardiology | Admitting: Cardiology

## 2018-02-06 ENCOUNTER — Encounter (HOSPITAL_COMMUNITY)
Admission: RE | Admit: 2018-02-06 | Discharge: 2018-02-06 | Disposition: A | Discharge status: Home / Self Care | Payer: Self-pay | Source: Ambulatory Visit | Attending: Cardiology | Admitting: Cardiology

## 2018-02-08 ENCOUNTER — Encounter (HOSPITAL_COMMUNITY): Payer: Self-pay

## 2018-02-11 ENCOUNTER — Encounter (HOSPITAL_COMMUNITY)
Admission: RE | Admit: 2018-02-11 | Discharge: 2018-02-11 | Disposition: A | Discharge status: Home / Self Care | Payer: Medicare Other | Source: Ambulatory Visit | Attending: Cardiology | Admitting: Cardiology

## 2018-02-13 ENCOUNTER — Encounter (HOSPITAL_COMMUNITY)
Admission: RE | Admit: 2018-02-13 | Discharge: 2018-02-13 | Disposition: A | Discharge status: Home / Self Care | Payer: Self-pay | Source: Ambulatory Visit | Attending: Cardiology | Admitting: Cardiology

## 2018-02-15 ENCOUNTER — Encounter (HOSPITAL_COMMUNITY)
Admission: RE | Admit: 2018-02-15 | Discharge: 2018-02-15 | Disposition: A | Discharge status: Home / Self Care | Payer: Self-pay | Source: Ambulatory Visit | Attending: Cardiology | Admitting: Cardiology

## 2018-02-18 ENCOUNTER — Encounter (HOSPITAL_COMMUNITY)
Admission: RE | Admit: 2018-02-18 | Discharge: 2018-02-18 | Disposition: A | Discharge status: Home / Self Care | Payer: Self-pay | Source: Ambulatory Visit | Attending: Cardiology | Admitting: Cardiology

## 2018-02-20 ENCOUNTER — Encounter (HOSPITAL_COMMUNITY)
Admission: RE | Admit: 2018-02-20 | Discharge: 2018-02-20 | Disposition: A | Discharge status: Home / Self Care | Payer: Self-pay | Source: Ambulatory Visit | Attending: Cardiology | Admitting: Cardiology

## 2018-02-22 ENCOUNTER — Encounter (HOSPITAL_COMMUNITY)
Admission: RE | Admit: 2018-02-22 | Discharge: 2018-02-22 | Disposition: A | Discharge status: Home / Self Care | Payer: Self-pay | Source: Ambulatory Visit | Attending: Cardiology | Admitting: Cardiology

## 2018-02-25 ENCOUNTER — Encounter (HOSPITAL_COMMUNITY)
Admission: RE | Admit: 2018-02-25 | Discharge: 2018-02-25 | Disposition: A | Discharge status: Home / Self Care | Payer: Self-pay | Source: Ambulatory Visit | Attending: Cardiology | Admitting: Cardiology

## 2018-02-27 ENCOUNTER — Encounter (HOSPITAL_COMMUNITY): Payer: Self-pay

## 2018-03-01 ENCOUNTER — Encounter (HOSPITAL_COMMUNITY): Payer: Self-pay

## 2018-03-04 ENCOUNTER — Encounter (HOSPITAL_COMMUNITY)
Admission: RE | Admit: 2018-03-04 | Discharge: 2018-03-04 | Disposition: A | Discharge status: Home / Self Care | Payer: Self-pay | Source: Ambulatory Visit | Attending: Cardiology | Admitting: Cardiology

## 2018-03-04 DIAGNOSIS — Z951 Presence of aortocoronary bypass graft: Secondary | ICD-10-CM | POA: Insufficient documentation

## 2018-03-04 DIAGNOSIS — I25119 Atherosclerotic heart disease of native coronary artery with unspecified angina pectoris: Secondary | ICD-10-CM | POA: Insufficient documentation

## 2018-03-06 ENCOUNTER — Encounter (HOSPITAL_COMMUNITY)
Admission: RE | Admit: 2018-03-06 | Discharge: 2018-03-06 | Disposition: A | Discharge status: Home / Self Care | Payer: Medicare Other | Source: Ambulatory Visit | Attending: Cardiology | Admitting: Cardiology

## 2018-03-08 ENCOUNTER — Encounter (HOSPITAL_COMMUNITY)
Admission: RE | Admit: 2018-03-08 | Discharge: 2018-03-08 | Disposition: A | Discharge status: Home / Self Care | Payer: Medicare Other | Source: Ambulatory Visit | Attending: Cardiology | Admitting: Cardiology

## 2018-03-11 ENCOUNTER — Encounter (HOSPITAL_COMMUNITY)
Admission: RE | Admit: 2018-03-11 | Discharge: 2018-03-11 | Disposition: A | Discharge status: Home / Self Care | Payer: Medicare Other | Source: Ambulatory Visit | Attending: Cardiology | Admitting: Cardiology

## 2018-03-13 ENCOUNTER — Encounter (HOSPITAL_COMMUNITY): Payer: Self-pay

## 2018-03-15 ENCOUNTER — Encounter (HOSPITAL_COMMUNITY)
Admission: RE | Admit: 2018-03-15 | Discharge: 2018-03-15 | Disposition: A | Discharge status: Home / Self Care | Payer: Medicare Other | Source: Ambulatory Visit | Attending: Cardiology | Admitting: Cardiology

## 2018-03-18 ENCOUNTER — Encounter (HOSPITAL_COMMUNITY): Payer: Self-pay

## 2018-03-20 ENCOUNTER — Encounter (HOSPITAL_COMMUNITY): Payer: Self-pay

## 2018-03-22 ENCOUNTER — Encounter (HOSPITAL_COMMUNITY): Payer: Self-pay

## 2018-03-25 ENCOUNTER — Encounter (HOSPITAL_COMMUNITY): Payer: Self-pay

## 2018-03-27 ENCOUNTER — Encounter (HOSPITAL_COMMUNITY): Payer: Self-pay

## 2018-03-29 ENCOUNTER — Encounter (HOSPITAL_COMMUNITY): Payer: Self-pay

## 2018-03-31 HISTORY — PX: NM MYOVIEW LTD: HXRAD82

## 2018-04-01 ENCOUNTER — Encounter (HOSPITAL_COMMUNITY)
Admission: RE | Admit: 2018-04-01 | Discharge: 2018-04-01 | Disposition: A | Discharge status: Home / Self Care | Payer: Medicare Other | Source: Ambulatory Visit | Attending: Cardiology | Admitting: Cardiology

## 2018-04-01 DIAGNOSIS — I25119 Atherosclerotic heart disease of native coronary artery with unspecified angina pectoris: Secondary | ICD-10-CM | POA: Insufficient documentation

## 2018-04-01 DIAGNOSIS — Z951 Presence of aortocoronary bypass graft: Secondary | ICD-10-CM | POA: Insufficient documentation

## 2018-04-03 ENCOUNTER — Encounter (HOSPITAL_COMMUNITY): Payer: Self-pay

## 2018-04-05 ENCOUNTER — Encounter (HOSPITAL_COMMUNITY): Payer: Self-pay

## 2018-04-08 ENCOUNTER — Encounter: Payer: Self-pay | Admitting: Cardiology

## 2018-04-08 ENCOUNTER — Ambulatory Visit (INDEPENDENT_AMBULATORY_CARE_PROVIDER_SITE_OTHER): Payer: Medicare Other | Admitting: Cardiology

## 2018-04-08 ENCOUNTER — Encounter (HOSPITAL_COMMUNITY): Payer: Self-pay

## 2018-04-08 VITALS — BP 176/86 | HR 61 | Ht 71.0 in | Wt 160.4 lb

## 2018-04-08 DIAGNOSIS — Z951 Presence of aortocoronary bypass graft: Secondary | ICD-10-CM | POA: Diagnosis not present

## 2018-04-08 DIAGNOSIS — E785 Hyperlipidemia, unspecified: Secondary | ICD-10-CM

## 2018-04-08 DIAGNOSIS — I255 Ischemic cardiomyopathy: Secondary | ICD-10-CM

## 2018-04-08 DIAGNOSIS — I2510 Atherosclerotic heart disease of native coronary artery without angina pectoris: Secondary | ICD-10-CM

## 2018-04-08 LAB — COMPREHENSIVE METABOLIC PANEL
ALBUMIN: 4.6 g/dL (ref 3.8–4.8)
ALT: 22 IU/L (ref 0–44)
AST: 28 IU/L (ref 0–40)
Albumin/Globulin Ratio: 2.1 (ref 1.2–2.2)
Alkaline Phosphatase: 73 IU/L (ref 39–117)
BUN / CREAT RATIO: 24 (ref 10–24)
BUN: 24 mg/dL (ref 8–27)
Bilirubin Total: 0.3 mg/dL (ref 0.0–1.2)
CALCIUM: 9.5 mg/dL (ref 8.6–10.2)
CO2: 25 mmol/L (ref 20–29)
CREATININE: 1 mg/dL (ref 0.76–1.27)
Chloride: 101 mmol/L (ref 96–106)
GFR calc non Af Amer: 78 mL/min/{1.73_m2} (ref >59)
GFR, EST AFRICAN AMERICAN: 90 mL/min/{1.73_m2} (ref >59)
Globulin, Total: 2.2 g/dL (ref 1.5–4.5)
Glucose: 111 mg/dL — ABNORMAL HIGH (ref 65–99)
Potassium: 5.3 mmol/L — ABNORMAL HIGH (ref 3.5–5.2)
Sodium: 142 mmol/L (ref 134–144)
TOTAL PROTEIN: 6.8 g/dL (ref 6.0–8.5)

## 2018-04-08 LAB — LIPID PANEL
Chol/HDL Ratio: 2.3 ratio (ref 0.0–5.0)
Cholesterol, Total: 140 mg/dL (ref 100–199)
HDL: 62 mg/dL (ref >39)
LDL Calculated: 65 mg/dL (ref 0–99)
Triglycerides: 64 mg/dL (ref 0–149)
VLDL Cholesterol Cal: 13 mg/dL (ref 5–40)

## 2018-04-08 NOTE — Patient Instructions (Addendum)
Medication Instructions:  NOT NEEDED If you need a refill on your cardiac medications before your next appointment, please call your pharmacy.   Lab work: CMP LIPID If you have labs (blood work) drawn today and your tests are completely normal, you will receive your results only by: Marland Kitchen MyChart Message (if you have MyChart) OR . A paper copy in the mail If you have any lab test that is abnormal or we need to change your treatment, we will call you to review the results.  Testing/Procedures: SCHEDULE AT [place] Your physician has requested that you have en exercise stress myoview. For further information please visit [email or web address]. Please follow instruction sheet, as given.   Follow-Up: At [facility], you and your health needs are our priority.  As part of our continuing mission to provide you with exceptional heart care, we have created designated Provider Care Teams.  These Care Teams include your primary Cardiologist (physician) and Advanced Practice Providers (APPs -  Physician Assistants and Nurse Practitioners) who all work together to provide you with the care you need, when you need it. You will need a follow up appointment in 6 months SEPT 2020.  Please call our office 2 months in advance to schedule this appointment.  You may see Glenetta Hew, MD or one of the following Advanced Practice Providers on your designated Care Team:   Rosaria Ferries, PA-C . Jory Sims, DNP, ANP  Any Other Special Instructions Will Be Listed Below (If Applicable).

## 2018-04-08 NOTE — Progress Notes (Signed)
PCP: [name], MD  Clinic Note: Chief Complaint  Patient presents with  . Follow-up    Doing well  . Coronary Artery Disease    History of CABG    HPI: Dillian Feig. [name]") is a 68 y.o. male with a PMH of multivessel coronary artery disease noted by cath as part of evaluation for class III angina  (Reviewed below) who presents today for routine 74-month follow-up  DIAGNOSED WITH MV CAD IN MAY 2016  Initial presented with SSx c/w Class II-III Angina --> (had previously been walking several miles daily)  High Risk Myoview (April 2016) -> Cath  Cath with MV CAD (100% LAD, 99% mRCA-100% dRCA & 90% p-m Cx) w/ EF 35-45% --> CABG  CABG: LIMA-OM, RIMA-LAD, SVG-Diag, SVG-rPDA (Dr. Cyndia Bent)  Repeat Echo: EF 50-55%  Continues with Maintenance Program of Cardiac Rehab.  07/10/16: s/p R thoracotomy for RLL nodule wedge resection -- Squamous Cell CA.(Dr. Cyndia Bent) - negative margins. - complicated by air leak (Sub Q emphysema - redo chest tube x 2)  [name] was last seen in Sept 2019--He was doing well.  Still going to maintenance [facility] 3 d/week & exercising on off days.  Still playing Golf.  Recent Hospitalizations: - Studies Personally Reviewed - (if available, images/films reviewed: From Epic Chart or Care Everywhere)  none  Interval History: "[name]" returns today still doing well.  Very stable overall.  He is still playing golf and doing his exercises at the gym on days that he is not going to his maintenance program cardiac rehab.  He is doing Recruitment consultant.  He is not noticing any chest tightness pressure with rest or exertion.  No exertional dyspnea.   No PND, orthopnea or edema. No rapid irregular heartbeats or palpitations.  No syncope/near syncope or TIA/amaurosis fugax.  He does indicate that today he was a little stressed out because he did not get much sleep last night.  They have dog that was sick all night long and there have not actually taken to the vet to  be euthanized today.  He is quite upset about that.  Usually at home or at cardiac rehab, his blood pressures run in the 110/80 range.  He denies any bleeding issues.  No more cough and wheezing.  No claudication  ROS: A comprehensive was performed. Review of Systems  Constitutional: Negative for malaise/fatigue (has essentially fully recovered from his surgery).  HENT: Positive for congestion (Related to allergies.).   Respiratory: Negative for cough, shortness of breath and wheezing.   Cardiovascular:       Per HPI  Gastrointestinal: Negative for heartburn (if he eats spicy foods).  Musculoskeletal: Negative for joint pain (knee pain if he walks  alot).  Endo/Heme/Allergies: Positive for environmental allergies.  Psychiatric/Behavioral: The patient has insomnia (but getting better).   All other systems reviewed and are negative.  I have reviewed and (if needed) personally updated the patient's problem list, medications, allergies, past medical and surgical history, social and family history.   Past Medical History:  Diagnosis Date  . Coronary artery disease, occlusive 06/02/2014   Multivessel CAD.mLAD-100%, mRCA 99%, dRCA 100%, mLCX 90% And EF 35-45%.  . Former heavy tobacco smoker    Quit in April 2016   . Hyperlipidemia with target LDL less than 70   . Ischemic cardiomyopathy - resolved 05/2014   Myoview: EF ~33% with "infarction vs. severe resting ischemia in LAD & RCA territory; b) EF by Cath: 35-45%. c) post  CABG Echo 9016: EF 50-55%  . Lung nodules    right lung lower lobe  . 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 CARDIAC CATHETERIZATION N/A 06/02/2014   Procedure: Left Heart Cath and Coronary Angiography;  Surgeon: Leonie Man, MD;  Location: [facility];  Service: Cardiovascular;  mLAD-100%, mRCA 99%, dRCA 100%, mLCX 90% And EF 35-45%.  . CORONARY ARTERY BYPASS GRAFT N/A  06/04/2014   Procedure: CORONARY ARTERY BYPASS GRAFTING (CABG) times four using bilateral internal mammary arteries and EVH for left  leg saphenous vein;  Surgeon: Gaye Pollack, MD;  Location: [facility];  Service: Open Heart Surgery;  LIMA-OM, RIMA-LAD, SVG-Diag, SVG-rPDA  . NM MYOVIEW LTD  05/28/2014   Pre-CABG:  High Risk Nuclear Stress Test with multivessel distribution ischemia. -- Severely ischemic Cardiomyoapthy with evidence of at least 2 vessel disease & EF of ~33%. Images are consistent with either infarction or severe resting ischemia in the LAD & RCA territory.  . TEE WITHOUT CARDIOVERSION N/A 06/04/2014   Procedure: TRANSESOPHAGEAL ECHOCARDIOGRAM (TEE);  Surgeon: Gaye Pollack, MD;  Location: [facility];  Service: Open Heart Surgery;  Laterality: N/A;  . THORACOTOMY Right 07/10/2016   Procedure: THORACOTOMY;  Surgeon: Gaye Pollack, MD;  Location: [facility];  Service: Thoracic;  Laterality: Right;  . TONSILLECTOMY  1957  . TRANSTHORACIC ECHOCARDIOGRAM  10/2014   Mild concentric LVH. EF 50-55% with mild HK of basal anteroseptal myocardium. GR 1 DD.  Marland Kitchen VIDEO ASSISTED THORACOSCOPY (VATS)/WEDGE RESECTION Right 07/10/2016   Procedure: VIDEO ASSISTED THORACOSCOPY (VATS)/WEDGE RESECTION;  Surgeon: Gaye Pollack, MD;  Location: [facility];  Service: Thoracic;  Laterality: Right;    Current Meds  Medication Sig  . aspirin 81 MG tablet Take 81 mg by mouth daily.  Marland Kitchen atorvastatin (LIPITOR) 40 MG tablet Take 40 mg by mouth daily.  . carvedilol (COREG) 3.125 MG tablet Take 2 tablets (6.25 mg total) by mouth 2 (two) times daily.  Marland Kitchen losartan (COZAAR) 25 MG tablet Take 1 tablet (25 mg total) by mouth daily.  . sildenafil (VIAGRA) 50 MG tablet Take 1 tablet (50 mg total) by mouth daily as needed for erectile dysfunction.  . Vitamin D, Cholecalciferol, 1000 units CAPS Take 1 capsule by mouth daily.    Allergies  Allergen Reactions  . No Known Allergies    Social History   Tobacco Use  . Smoking status:  Former Smoker    Packs/day: 0.80    Years: 46.00    Pack years: 36.80    Types: Cigarettes    Last attempt to quit: 05/29/2014    Years since quitting: 3.8  . Smokeless tobacco: Never Used  Substance Use Topics  . Alcohol use: Yes    Alcohol/week: 14.0 - 21.0 standard drinks    Types: 7 Glasses of wine, 7 - 14 Standard drinks or equivalent per week  . Drug use: No   Social History   Social History Narrative   Wife recently diagnosed with early stage breast cancer   Family History family history includes Cardiomyopathy in his father; Emphysema in his mother; Healthy in his sister and sister; Heart attack in his maternal grandfather; Heart disease in his paternal uncle.  Wt Readings from Last 3 Encounters:  04/08/18 160 lb 6.4 oz (72.8 kg)  12/12/17 155 lb (70.3 kg)  10/03/17 160 lb 9.6 oz (72.8 kg)    PHYSICAL  EXAM BP (!) 176/86   Pulse 61   Ht 5\' 11"  (1.803 m)   Wt 160 lb 6.4 oz (72.8 kg)   SpO2 97%   BMI 22.37 kg/m  Physical Exam  Constitutional: He is oriented to person, place, and time. He appears well-developed and well-nourished. No distress.  Well-groomed  HENT:  Head: Normocephalic and atraumatic.  Mouth/Throat: Oropharynx is clear and moist.  Mild exophthalmos  Neck: Normal range of motion. Neck supple. No hepatojugular reflux and no JVD present. Carotid bruit is not present.  Cardiovascular: Normal rate, regular rhythm, normal heart sounds and intact distal pulses.  No extrasystoles are present. PMI is not displaced. Exam reveals no gallop and no friction rub.  No murmur heard. Pulmonary/Chest: Effort normal and breath sounds normal. No respiratory distress. He has no wheezes. He has no rales.  Abdominal: Soft. Bowel sounds are normal. He exhibits no distension. There is no abdominal tenderness. There is no rebound.  No HSM  Musculoskeletal: Normal range of motion.        General: No edema.  Neurological: He is alert and oriented to person, place, and time.    Psychiatric: He has a normal mood and affect. His behavior is normal. Judgment and thought content normal.  Vitals reviewed.   Adult ECG Report Not checked  Other studies Reviewed: Additional studies/ records that were reviewed today include:  Recent Labs: Drawn this morning Lab Results  Component Value Date   CHOL 140 04/08/2018   HDL 62 04/08/2018   LDLCALC 65 04/08/2018   TRIG 64 04/08/2018   CHOLHDL 2.3 04/08/2018    ASSESSMENT / PLAN: Problem List Items Addressed This Visit    Cardiomyopathy, ischemic: EF ~35-45% by LV Gram --> 50 and 55% by echo (Chronic)    Notable improvement of EF post CABG.  Now doing well with no recurrent anginal symptoms.  Completely improved symptomatically following CABG. Continues to do maintenance program cardiac rehab.  He is on stable low-dose carvedilol and losartan.  No diuretic requirement.      Relevant Orders   MYOCARDIAL PERFUSION IMAGING   Lipid panel (Completed)   Comprehensive metabolic panel (Completed)   Coronary artery disease involving native coronary artery without angina pectoris - Primary (Chronic)    No further anginal symptoms since CABG.  He had classic 2-3 angina prior to CABG.  He is now on aspirin, statin as well as carvedilol and losartan.      Relevant Orders   MYOCARDIAL PERFUSION IMAGING   Lipid panel (Completed)   Comprehensive metabolic panel (Completed)   Hx of CABG (Chronic)    He is getting close to 4 years out from his MI.  We did talk to the past about follow-up stress test to confirm patency of the stent.  Recommendation was about 4 years out.  We therefore schedule him for surveillance Myoview stress test.      Hyperlipidemia with target LDL less than 70 (Chronic)    Labs have been relatively stable on current dose of atorvastatin.  We had gone from 80 down to 40 and then 20 and then back up to 40 mg dosing.  I think we will discontinue the 40 mg. He had labs drawn today which shows an LDL of  65.  If he stays stable at this level may want to consider increasing therapy to try to drive less than 50.      Relevant Orders   Lipid panel (Completed)   Comprehensive metabolic panel (Completed)  Current medicines are reviewed at length with the patient today. (+/- concerns) none The following changes have been made: none  Patient Instructions  Medication Instructions:  NOT NEEDED If you need a refill on your cardiac medications before your next appointment, please call your pharmacy.   Lab work: CMP LIPID If you have labs (blood work) drawn today and your tests are completely normal, you will receive your results only by: Marland Kitchen MyChart Message (if you have MyChart) OR . A paper copy in the mail If you have any lab test that is abnormal or we need to change your treatment, we will call you to review the results.  Testing/Procedures: SCHEDULE AT [facility] Your physician has requested that you have en exercise stress myoview. For further information please visit [email or web address]. Please follow instruction sheet, as given.   Follow-Up: At [facility], you and your health needs are our priority.  As part of our continuing mission to provide you with exceptional heart care, we have created designated Provider Care Teams.  These Care Teams include your primary Cardiologist (physician) and Advanced Practice Providers (APPs -  Physician Assistants and Nurse Practitioners) who all work together to provide you with the care you need, when you need it. You will need a follow up appointment in 6 months SEPT 2020.  Please call our office 2 months in advance to schedule this appointment.  You may see Glenetta Hew, MD or one of the following Advanced Practice Providers on your designated Care Team:   Rosaria Ferries, PA-C . Jory [facility], DNP, ANP  Any Other Special Instructions Will Be Listed Below (If Applicable).    Studies Ordered:   Orders Placed  This Encounter  Procedures  . Lipid panel  . Comprehensive metabolic panel  . MYOCARDIAL PERFUSION IMAGING     Glenetta Hew, M.D., M.S. Interventional Cardiologist   Pager # [phone] Phone # [phone] [address] [place],  [address]

## 2018-04-09 ENCOUNTER — Telehealth (HOSPITAL_COMMUNITY): Payer: Self-pay

## 2018-04-09 ENCOUNTER — Encounter: Payer: Self-pay | Admitting: Cardiology

## 2018-04-09 NOTE — Assessment & Plan Note (Signed)
No further anginal symptoms since CABG.  He had classic 2-3 angina prior to CABG.  He is now on aspirin, statin as well as carvedilol and losartan.

## 2018-04-09 NOTE — Assessment & Plan Note (Signed)
He is getting close to 4 years out from his MI.  We did talk to the past about follow-up stress test to confirm patency of the stent.  Recommendation was about 4 years out.  We therefore schedule him for surveillance Myoview stress test.

## 2018-04-09 NOTE — Telephone Encounter (Signed)
Encounter complete. 

## 2018-04-09 NOTE — Assessment & Plan Note (Signed)
Labs have been relatively stable on current dose of atorvastatin.  We had gone from 80 down to 40 and then 20 and then back up to 40 mg dosing.  I think we will discontinue the 40 mg. He had labs drawn today which shows an LDL of 65.  If he stays stable at this level may want to consider increasing therapy to try to drive less than 50.

## 2018-04-09 NOTE — Assessment & Plan Note (Signed)
Notable improvement of EF post CABG.  Now doing well with no recurrent anginal symptoms.  Completely improved symptomatically following CABG. Continues to do maintenance program cardiac rehab.  He is on stable low-dose carvedilol and losartan.  No diuretic requirement.

## 2018-04-10 ENCOUNTER — Encounter (HOSPITAL_COMMUNITY): Payer: Self-pay

## 2018-04-11 ENCOUNTER — Other Ambulatory Visit: Payer: Self-pay

## 2018-04-11 ENCOUNTER — Ambulatory Visit (HOSPITAL_COMMUNITY)
Admission: RE | Admit: 2018-04-11 | Discharge: 2018-04-11 | Disposition: A | Discharge status: Home / Self Care | Payer: Medicare Other | Source: Ambulatory Visit | Attending: Cardiovascular Disease | Admitting: Cardiovascular Disease

## 2018-04-11 DIAGNOSIS — I2510 Atherosclerotic heart disease of native coronary artery without angina pectoris: Secondary | ICD-10-CM

## 2018-04-11 DIAGNOSIS — I255 Ischemic cardiomyopathy: Secondary | ICD-10-CM | POA: Diagnosis not present

## 2018-04-11 LAB — MYOCARDIAL PERFUSION IMAGING
CHL RATE OF PERCEIVED EXERTION: 19
CSEPEDS: 31 s
CSEPPHR: 144 {beats}/min
Estimated workload: 10.1 METS
Exercise duration (min): 8 min
LV dias vol: 108 mL (ref 62–150)
LV sys vol: 51 mL
MPHR: 153 {beats}/min
Percent HR: 94 %
Rest HR: 62 {beats}/min
SDS: 2
SRS: 0
SSS: 2
TID: 1.08

## 2018-04-11 MED ORDER — TECHNETIUM TC 99M TETROFOSMIN IV KIT
9.8000 | PACK | Freq: Once | INTRAVENOUS | Status: AC | PRN
  Administered 2018-04-11: 9.8 via INTRAVENOUS
  Filled 2018-04-11: qty 10

## 2018-04-11 MED ORDER — TECHNETIUM TC 99M TETROFOSMIN IV KIT
30.1000 | PACK | Freq: Once | INTRAVENOUS | Status: AC | PRN
  Administered 2018-04-11: 30.1 via INTRAVENOUS
  Filled 2018-04-11: qty 31

## 2018-04-12 ENCOUNTER — Encounter (HOSPITAL_COMMUNITY)
Admission: RE | Admit: 2018-04-12 | Discharge: 2018-04-12 | Disposition: A | Discharge status: Home / Self Care | Payer: Medicare Other | Source: Ambulatory Visit | Attending: Cardiology | Admitting: Cardiology

## 2018-04-15 ENCOUNTER — Encounter (HOSPITAL_COMMUNITY): Payer: Self-pay

## 2018-04-15 ENCOUNTER — Telehealth (HOSPITAL_COMMUNITY): Payer: Self-pay | Admitting: Cardiac Rehabilitation

## 2018-04-15 NOTE — Telephone Encounter (Signed)
Phone call to patient to notify of [facility] Phase II departmental closing for 2 weeks.  Pt verbalized understanding.  Andi Hence, RN, BSN Cardiac Pulmonary Rehab

## 2018-04-17 ENCOUNTER — Encounter (HOSPITAL_COMMUNITY): Payer: Self-pay

## 2018-04-17 DIAGNOSIS — H2513 Age-related nuclear cataract, bilateral: Secondary | ICD-10-CM | POA: Diagnosis not present

## 2018-04-17 DIAGNOSIS — H5053 Vertical heterophoria: Secondary | ICD-10-CM | POA: Diagnosis not present

## 2018-04-19 ENCOUNTER — Encounter (HOSPITAL_COMMUNITY): Payer: Self-pay

## 2018-04-22 ENCOUNTER — Encounter (HOSPITAL_COMMUNITY): Payer: Self-pay

## 2018-04-23 ENCOUNTER — Telehealth (HOSPITAL_COMMUNITY): Payer: Self-pay | Admitting: *Deleted

## 2018-04-24 ENCOUNTER — Other Ambulatory Visit: Payer: Self-pay | Admitting: Cardiology

## 2018-04-24 ENCOUNTER — Encounter (HOSPITAL_COMMUNITY): Payer: Self-pay

## 2018-04-26 ENCOUNTER — Encounter (HOSPITAL_COMMUNITY): Payer: Self-pay

## 2018-04-29 ENCOUNTER — Encounter (HOSPITAL_COMMUNITY): Payer: Self-pay

## 2018-05-01 ENCOUNTER — Other Ambulatory Visit: Payer: Self-pay | Admitting: *Deleted

## 2018-05-01 ENCOUNTER — Encounter (HOSPITAL_COMMUNITY): Payer: Self-pay

## 2018-05-01 DIAGNOSIS — R911 Solitary pulmonary nodule: Secondary | ICD-10-CM

## 2018-05-01 DIAGNOSIS — C3431 Malignant neoplasm of lower lobe, right bronchus or lung: Secondary | ICD-10-CM

## 2018-05-03 ENCOUNTER — Encounter (HOSPITAL_COMMUNITY): Payer: Self-pay

## 2018-05-06 ENCOUNTER — Encounter (HOSPITAL_COMMUNITY): Payer: Self-pay

## 2018-05-08 ENCOUNTER — Encounter (HOSPITAL_COMMUNITY): Payer: Self-pay

## 2018-05-10 ENCOUNTER — Encounter (HOSPITAL_COMMUNITY): Payer: Self-pay

## 2018-05-13 ENCOUNTER — Encounter (HOSPITAL_COMMUNITY): Payer: Self-pay

## 2018-05-15 ENCOUNTER — Encounter (HOSPITAL_COMMUNITY): Payer: Self-pay

## 2018-05-17 ENCOUNTER — Encounter (HOSPITAL_COMMUNITY): Payer: Self-pay

## 2018-05-20 ENCOUNTER — Encounter (HOSPITAL_COMMUNITY): Payer: Self-pay

## 2018-05-22 ENCOUNTER — Encounter (HOSPITAL_COMMUNITY): Payer: Self-pay

## 2018-05-24 ENCOUNTER — Encounter (HOSPITAL_COMMUNITY): Payer: Self-pay

## 2018-05-27 ENCOUNTER — Encounter (HOSPITAL_COMMUNITY): Payer: Self-pay

## 2018-05-29 ENCOUNTER — Encounter (HOSPITAL_COMMUNITY): Payer: Self-pay

## 2018-05-31 ENCOUNTER — Encounter (HOSPITAL_COMMUNITY): Payer: Self-pay

## 2018-06-03 ENCOUNTER — Encounter (HOSPITAL_COMMUNITY): Payer: Self-pay

## 2018-06-05 ENCOUNTER — Encounter (HOSPITAL_COMMUNITY): Payer: Self-pay

## 2018-06-07 ENCOUNTER — Encounter (HOSPITAL_COMMUNITY): Payer: Self-pay

## 2018-06-10 ENCOUNTER — Encounter (HOSPITAL_COMMUNITY): Payer: Self-pay

## 2018-06-12 ENCOUNTER — Encounter (HOSPITAL_COMMUNITY): Payer: Self-pay

## 2018-06-14 ENCOUNTER — Encounter (HOSPITAL_COMMUNITY): Payer: Self-pay

## 2018-06-17 ENCOUNTER — Encounter (HOSPITAL_COMMUNITY): Payer: Self-pay

## 2018-06-17 DIAGNOSIS — Z85118 Personal history of other malignant neoplasm of bronchus and lung: Secondary | ICD-10-CM | POA: Diagnosis not present

## 2018-06-17 DIAGNOSIS — Z1389 Encounter for screening for other disorder: Secondary | ICD-10-CM | POA: Diagnosis not present

## 2018-06-17 DIAGNOSIS — E7800 Pure hypercholesterolemia, unspecified: Secondary | ICD-10-CM | POA: Diagnosis not present

## 2018-06-17 DIAGNOSIS — N529 Male erectile dysfunction, unspecified: Secondary | ICD-10-CM | POA: Diagnosis not present

## 2018-06-17 DIAGNOSIS — I700 Atherosclerosis of aorta: Secondary | ICD-10-CM | POA: Diagnosis not present

## 2018-06-17 DIAGNOSIS — I25810 Atherosclerosis of coronary artery bypass graft(s) without angina pectoris: Secondary | ICD-10-CM | POA: Diagnosis not present

## 2018-06-17 DIAGNOSIS — R7301 Impaired fasting glucose: Secondary | ICD-10-CM | POA: Diagnosis not present

## 2018-06-17 DIAGNOSIS — Z0000 Encounter for general adult medical examination without abnormal findings: Secondary | ICD-10-CM | POA: Diagnosis not present

## 2018-06-19 ENCOUNTER — Encounter (HOSPITAL_COMMUNITY): Payer: Self-pay

## 2018-06-19 DIAGNOSIS — I25810 Atherosclerosis of coronary artery bypass graft(s) without angina pectoris: Secondary | ICD-10-CM | POA: Diagnosis not present

## 2018-06-19 DIAGNOSIS — R7301 Impaired fasting glucose: Secondary | ICD-10-CM | POA: Diagnosis not present

## 2018-06-21 ENCOUNTER — Encounter (HOSPITAL_COMMUNITY): Payer: Self-pay

## 2018-06-26 ENCOUNTER — Encounter (HOSPITAL_COMMUNITY): Payer: Self-pay

## 2018-06-28 ENCOUNTER — Encounter (HOSPITAL_COMMUNITY): Payer: Self-pay

## 2018-07-01 ENCOUNTER — Encounter (HOSPITAL_COMMUNITY): Payer: Self-pay

## 2018-07-03 ENCOUNTER — Encounter (HOSPITAL_COMMUNITY): Payer: Self-pay

## 2018-07-05 ENCOUNTER — Encounter (HOSPITAL_COMMUNITY): Payer: Self-pay

## 2018-07-08 ENCOUNTER — Encounter (HOSPITAL_COMMUNITY): Payer: Self-pay

## 2018-07-09 ENCOUNTER — Other Ambulatory Visit: Payer: Self-pay

## 2018-07-10 ENCOUNTER — Ambulatory Visit (INDEPENDENT_AMBULATORY_CARE_PROVIDER_SITE_OTHER): Payer: Medicare Other | Admitting: Surgery

## 2018-07-10 ENCOUNTER — Encounter: Payer: Self-pay | Admitting: Surgery

## 2018-07-10 ENCOUNTER — Other Ambulatory Visit: Payer: Self-pay | Admitting: *Deleted

## 2018-07-10 ENCOUNTER — Encounter (HOSPITAL_COMMUNITY): Payer: Self-pay

## 2018-07-10 ENCOUNTER — Ambulatory Visit
Admission: RE | Admit: 2018-07-10 | Discharge: 2018-07-10 | Disposition: A | Discharge status: Home / Self Care | Payer: Medicare Other | Source: Ambulatory Visit | Attending: Surgery | Admitting: Surgery

## 2018-07-10 VITALS — BP 150/80 | HR 80 | Temp 97.9°F | Resp 20 | Ht 71.0 in | Wt 160.0 lb

## 2018-07-10 DIAGNOSIS — C3431 Malignant neoplasm of lower lobe, right bronchus or lung: Secondary | ICD-10-CM

## 2018-07-10 DIAGNOSIS — R918 Other nonspecific abnormal finding of lung field: Secondary | ICD-10-CM

## 2018-07-10 DIAGNOSIS — I255 Ischemic cardiomyopathy: Secondary | ICD-10-CM

## 2018-07-10 DIAGNOSIS — J439 Emphysema, unspecified: Secondary | ICD-10-CM | POA: Diagnosis not present

## 2018-07-10 DIAGNOSIS — Z85118 Personal history of other malignant neoplasm of bronchus and lung: Secondary | ICD-10-CM

## 2018-07-10 DIAGNOSIS — R911 Solitary pulmonary nodule: Secondary | ICD-10-CM

## 2018-07-10 NOTE — Progress Notes (Unsigned)
P 

## 2018-07-10 NOTE — Progress Notes (Signed)
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 He continues to remain active and is exercising almost every day.  He denies any cough or sputum production.  He has had no shortness of breath.  He denies any headache or other neurologic symptoms.  Current Outpatient Medications  Medication Sig Dispense Refill  . aspirin 81 MG tablet Take 81 mg by mouth daily.    Marland Kitchen atorvastatin (LIPITOR) 40 MG tablet Take 40 mg by mouth daily.    . carvedilol (COREG) 3.125 MG tablet Take 2 tablets (6.25 mg total) by mouth 2 (two) times daily with a meal. 360 tablet 3  . losartan (COZAAR) 25 MG tablet Take 1 tablet (25 mg total) by mouth daily. 90 tablet 3  . sildenafil (VIAGRA) 50 MG tablet Take 1 tablet (50 mg total) by mouth daily as needed for erectile dysfunction. 10 tablet 5  . Vitamin D, Cholecalciferol, 1000 units CAPS Take 1 capsule by mouth daily.     No current facility-administered medications for this visit.      Physical Exam: BP (!) 150/80   Pulse 80   Temp 97.9 F (36.6 C) (Skin)   Resp 20   Ht 5\' 11"  (1.803 m)   Wt 160 lb (72.6 kg)   SpO2 96% Comment: RA  BMI 22.32 kg/m  He looks well. There is no cervical or supraclavicular adenopathy. Lung exam is clear. Cardiac exam shows a regular rate and rhythm with normal heart sounds. The right thoracotomy scar looks fine.  Diagnostic Tests:  CLINICAL DATA:  Lung nodules, history of right lung cancer.  EXAM: CT CHEST WITHOUT CONTRAST  TECHNIQUE: Multidetector CT imaging of the chest was performed following the standard protocol without IV contrast.  COMPARISON:  12/12/2017 cough 06/20/2017 and 11/15/2016.  FINDINGS: Cardiovascular: Atherosclerotic calcification of the aorta. Pulmonary arteries  are enlarged. Heart size normal. No pericardial effusion.  Mediastinum/Nodes: No pathologically enlarged mediastinal or axillary lymph nodes. Hilar regions are difficult to definitively evaluate without IV contrast. Esophagus is grossly unremarkable.  Lungs/Pleura: Centrilobular emphysema. Mild biapical pleuroparenchymal scarring. Irregular subpleural nodule in the medial aspect of the superior segment right lower lobe has enlarged, now measuring 0.9 x 1.6 cm (series 8, image 87), previously 0.4 x 0.9 cm on 12/12/2017. Postoperative scarring in the right lower lobe. Additional parenchymal and subpleural nodular densities measure up to 10 mm in the peripheral left lower lobe (122), stable from 12/12/2017. No pleural fluid. Airway is unremarkable.  Upper Abdomen: Subcentimeter low-attenuation lesion in the periphery of the right hepatic lobe is unchanged from 11/15/2016, indicative of a benign lesion. Visualized portions of the liver, gallbladder, adrenal glands, kidneys, spleen, pancreas, stomach and bowel are otherwise grossly unremarkable. No upper abdominal adenopathy.  Musculoskeletal: No worrisome lytic or sclerotic lesions.  IMPRESSION: 1. Irregular nodular subpleural soft tissue in the medial aspect of the superior segment right lower lobe, increased in size from 12/12/2017 and worrisome for primary bronchogenic carcinoma. Lesion appears more solid than would be expected with paraspinal scarring. 2. Additional bilateral pulmonary nodules are stable. 3.  Aortic atherosclerosis (ICD10-170.0). 4. Enlarged pulmonary arteries, indicative of pulmonary arterial hypertension. 5.  Emphysema (ICD10-J43.9).   Electronically Signed   By: Lorin Picket M.D.   On: 07/10/2018 09:14   Impression:  He is now 2 years out from wedge resection  of a small right lower lobe squamous cell carcinoma.  CT scan shows an irregular nodular subpleural soft tissue density in the medial  aspect of the superior segment of the right lower lobe adjacent to the spine which is increased in size from his prior CT scan on 12/12/2017.  This was barely visible at that time.  I think it is concerning for another primary bronchogenic carcinoma.  The other bilateral pulmonary nodules are stable.  I think this should be investigated further with a PET scan and further treatment decisions will be based on those results.  I reviewed the CT scan findings and images with him and answered all of his questions.  Plan:  He will have a PET scan done and I will see him back in the office to review the results and make further treatment decisions.  I spent 15 minutes performing this established patient evaluation and > 50% of this time was spent face to face counseling and coordinating the surveillance of this patient's lung cancer.   Gaye Pollack, MD [facility] [phone]

## 2018-07-12 ENCOUNTER — Encounter (HOSPITAL_COMMUNITY): Payer: Self-pay

## 2018-07-15 ENCOUNTER — Encounter (HOSPITAL_COMMUNITY): Payer: Self-pay

## 2018-07-17 ENCOUNTER — Encounter (HOSPITAL_COMMUNITY): Payer: Self-pay

## 2018-07-17 ENCOUNTER — Encounter (HOSPITAL_COMMUNITY)
Admission: RE | Admit: 2018-07-17 | Discharge: 2018-07-17 | Disposition: A | Discharge status: Home / Self Care | Payer: Medicare Other | Source: Ambulatory Visit | Attending: Surgery | Admitting: Surgery

## 2018-07-17 ENCOUNTER — Other Ambulatory Visit: Payer: Self-pay | Admitting: *Deleted

## 2018-07-17 ENCOUNTER — Other Ambulatory Visit: Payer: Self-pay

## 2018-07-17 DIAGNOSIS — R918 Other nonspecific abnormal finding of lung field: Secondary | ICD-10-CM | POA: Insufficient documentation

## 2018-07-17 DIAGNOSIS — R911 Solitary pulmonary nodule: Secondary | ICD-10-CM | POA: Diagnosis not present

## 2018-07-17 LAB — GLUCOSE, CAPILLARY: Glucose-Capillary: 115 mg/dL — ABNORMAL HIGH (ref 70–99)

## 2018-07-17 MED ORDER — FLUDEOXYGLUCOSE F - 18 (FDG) INJECTION
8.0200 | Freq: Once | INTRAVENOUS | Status: AC | PRN
  Administered 2018-07-17: 8.02 via INTRAVENOUS

## 2018-07-18 ENCOUNTER — Encounter: Payer: Self-pay | Admitting: *Deleted

## 2018-07-18 ENCOUNTER — Encounter: Payer: Self-pay | Admitting: Surgery

## 2018-07-18 ENCOUNTER — Ambulatory Visit (INDEPENDENT_AMBULATORY_CARE_PROVIDER_SITE_OTHER): Payer: Medicare Other | Admitting: Surgery

## 2018-07-18 ENCOUNTER — Ambulatory Visit: Payer: Medicare Other | Admitting: Surgery

## 2018-07-18 ENCOUNTER — Other Ambulatory Visit: Payer: Self-pay | Admitting: *Deleted

## 2018-07-18 VITALS — BP 158/63 | HR 87 | Temp 97.7°F | Resp 16 | Ht 71.0 in | Wt 160.0 lb

## 2018-07-18 DIAGNOSIS — I255 Ischemic cardiomyopathy: Secondary | ICD-10-CM | POA: Diagnosis not present

## 2018-07-18 DIAGNOSIS — R918 Other nonspecific abnormal finding of lung field: Secondary | ICD-10-CM | POA: Diagnosis not present

## 2018-07-18 DIAGNOSIS — Z85118 Personal history of other malignant neoplasm of bronchus and lung: Secondary | ICD-10-CM | POA: Diagnosis not present

## 2018-07-18 NOTE — Progress Notes (Signed)
HPI: [name] returns today to review the results of his recent PET scan and make further plans for treatment of his new right lower lobe lung mass.  Current Outpatient Medications  Medication Sig Dispense Refill  . aspirin 81 MG tablet Take 81 mg by mouth daily.    Marland Kitchen atorvastatin (LIPITOR) 40 MG tablet Take 40 mg by mouth daily.    . carvedilol (COREG) 3.125 MG tablet Take 2 tablets (6.25 mg total) by mouth 2 (two) times daily with a meal. 360 tablet 3  . losartan (COZAAR) 25 MG tablet Take 1 tablet (25 mg total) by mouth daily. 90 tablet 3  . sildenafil (VIAGRA) 50 MG tablet Take 1 tablet (50 mg total) by mouth daily as needed for erectile dysfunction. 10 tablet 5  . Vitamin D, Cholecalciferol, 1000 units CAPS Take 1 capsule by mouth daily.     No current facility-administered medications for this visit.      Physical Exam: BP (!) 158/63 (BP Location: Left Arm, Patient Position: Sitting, Cuff Size: Normal)   Pulse 87   Temp 97.7 F (36.5 C) (Other (Comment)) Comment (Src): thermal  Resp 16   Ht 5\' 11"  (1.803 m)   Wt 160 lb (72.6 kg)   SpO2 98% Comment: RA  BMI 22.32 kg/m    Diagnostic Tests:  CLINICAL DATA:  Subsequent treatment strategy for restaging of lung nodule. History of the vats and wedge resection on the right on 07/10/2016.  EXAM: NUCLEAR MEDICINE PET SKULL BASE TO THIGH  TECHNIQUE: 8.0 mCi F-18 FDG was injected intravenously. Full-ring PET imaging was performed from the skull base to thigh after the radiotracer. CT data was obtained and used for attenuation correction and anatomic localization.  Fasting blood glucose: 115 mg/dl  COMPARISON:  Chest CT of 07/10/2018.  PET of 07/04/2016.  FINDINGS: Mediastinal blood pool activity: SUV max 2.2  Liver activity: SUV max NA  NECK: No areas of abnormal hypermetabolism.  Incidental CT findings: No cervical adenopathy. Bilateral carotid atherosclerosis.  CHEST: Hypermetabolism  corresponding to the area of pleural-based nodularity within the medial portion of the superior segment right lower lobe. Example of 1.4 cm and a S.U.V. max of 6.4 on image 40/8.  No thoracic nodal hypermetabolism.  Incidental CT findings: Deferred to recent diagnostic CT. Centrilobular emphysema. Right lower lobe wedge resection. Aortic atherosclerosis.  ABDOMEN/PELVIS: No abdominopelvic parenchymal or nodal hypermetabolism.  Incidental CT findings: Normal adrenal glands. Abdominal aortic atherosclerosis. Extensive colonic diverticulosis.  SKELETON: No abnormal marrow activity.  Incidental CT findings: none  IMPRESSION: Hypermetabolism corresponding to the pleural-based nodularity within the medial portion of the superior segment right lower lobe. 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 Presuming non-small-cell histology, T1bN0M0 or stage IA.   Electronically Signed   By: Abigail Miyamoto M.D.   On: 07/17/2018 11:37   Impression:  This 68 year old gentleman has a new 1.6 cm irregular nodular subpleural mass in the medial aspect of the superior segment of the right lower lobe which is increased in size compared to his prior CT in November 2019.  PET scan shows this lesion to be hypermetabolic with an SUV max of 6.4 which is suspicious for primary bronchogenic carcinoma.  There are no other areas of hypermetabolism on the PET scan.  This lesion is in an area that would be difficult to biopsy with a high likelihood of a false negative result.  I also think it is in a difficult area to treat with radiation therapy.  He is in overall good condition and I think is a suitable candidate for right thoracotomy and right lower lobectomy.  He had a negative nuclear stress test by Dr. Ellyn Hack on 04/11/2018.  I do not think this lesion will be amenable to wedge resection given its location.  His pulmonary function test 2 years ago were adequate to allow right lower  lobectomy although he had a severely reduced diffusion capacity of 36% of predicted at that time.  We will repeat these.  I think his best long-term prognosis is going to be with surgical resection of this lesion.  I reviewed the PET scan images with the patient and discussed treatment options with him and his wife by conference call.  I discussed the surgical procedure of a right lower lobectomy including alternatives, benefits, and risks including but not limited to bleeding, blood transfusion, infection, prolonged air leak, pleural space complications, and the possibility that he may develop another cancer in the future.  He understands and agrees to proceed.   Plan:  He will be scheduled for right thoracotomy and right lower lobectomy on Monday, 07/29/2018.  We will schedule pulmonary function testing with diffusion capacity preoperatively.   Gaye Pollack, MD [facility] [phone]

## 2018-07-19 ENCOUNTER — Encounter (HOSPITAL_COMMUNITY): Payer: Self-pay

## 2018-07-22 ENCOUNTER — Encounter (HOSPITAL_COMMUNITY): Payer: Self-pay

## 2018-07-23 NOTE — Progress Notes (Signed)
[id number] [place], [place] [address] [place] [address] Phone: [phone] Fax: [phone]  [place], [place] [address] [place] [address] Phone: [phone] Fax: [phone]  [facility] ([place], [place] [address] Phone: [phone] Fax: [phone]      Your procedure is scheduled on June 29  Report to [facility] Main Entrance "A" at 0530 A.M., and check in at the Admitting office.  Call this number if you have problems the morning of surgery:  [phone]  Call [phone] if you have any questions prior to your surgery date Monday-Friday 8am-4pm    Remember:  Do not eat or drink after midnight.     Take these medicines the morning of surgery with A SIP OF WATER  carvedilol (COREG atorvastatin (LIPITOR) 40   7 days prior to surgery STOP taking any Aspirin (unless otherwise instructed by your surgeon), Aleve, Naproxen, Ibuprofen, Motrin, Advil, Goody's, BC's, all herbal medications, fish oil, and all vitamins.    The Morning of Surgery  Do not wear jewelry, make-up or nail polish.  Do not wear lotions, powders, or perfumes/colognes, or deodorant  Do not shave 48 hours prior to surgery.  Men may shave face and neck.  Do not bring valuables to the hospital.  [facility] is not responsible for any belongings or valuables.  If you are a smoker, DO NOT Smoke 24 hours prior to surgery IF you wear a CPAP at night please bring your mask, tubing, and machine the morning of surgery   Remember that you must have someone to transport you home after your surgery, and remain with you for 24 hours if you are discharged the same day.   Contacts, glasses, hearing aids, dentures or bridgework may not be worn into surgery.    Leave your suitcase  in the car.  After surgery it may be brought to your room.  For patients admitted to the hospital, discharge time will be determined by your treatment team.  Patients discharged the day of surgery will not be allowed to drive home.    Special instructions:   [place]- Preparing For Surgery  Before surgery, you can play an important role. Because skin is not sterile, your skin needs to be as free of germs as possible. You can reduce the number of germs on your skin by washing with CHG (chlorahexidine gluconate) Soap before surgery.  CHG is an antiseptic cleaner which kills germs and bonds with the skin to continue killing germs even after washing.    Oral Hygiene is also important to reduce your risk of infection.  Remember - BRUSH YOUR TEETH THE MORNING OF SURGERY WITH YOUR REGULAR TOOTHPASTE  Please do not use if you have an allergy to CHG or antibacterial soaps. If your skin becomes reddened/irritated stop using the CHG.  Do not shave (including legs and underarms) for at least 48 hours prior to first CHG shower. It is OK to shave your face.  Please follow these instructions carefully.   1. Shower the NIGHT BEFORE SURGERY and the MORNING OF SURGERY with CHG Soap.   2. If you chose to wash your hair, wash your hair first as usual with your normal shampoo.  3. After you shampoo, rinse your hair and body thoroughly to  remove the shampoo.  4. Use CHG as you would any other liquid soap. You can apply CHG directly to the skin and wash gently with a scrungie or a clean washcloth.   5. Apply the CHG Soap to your body ONLY FROM THE NECK DOWN.  Do not use on open wounds or open sores. Avoid contact with your eyes, ears, mouth and genitals (private parts). Wash Face and genitals (private parts)  with your normal soap.   6. Wash thoroughly, paying special attention to the area where your surgery will be performed.  7. Thoroughly rinse your body with warm water from the neck down.  8. DO NOT  shower/wash with your normal soap after using and rinsing off the CHG Soap.  9. Pat yourself dry with a CLEAN TOWEL.  10. Wear CLEAN PAJAMAS to bed the night before surgery, wear comfortable clothes the morning of surgery  11. Place CLEAN SHEETS on your bed the night of your first shower and DO NOT SLEEP WITH PETS.    Day of Surgery:  Do not apply any deodorants/lotions.  Please wear clean clothes to the hospital/surgery center.   Remember to brush your teeth WITH YOUR REGULAR TOOTHPASTE.   Please read over the following fact sheets that you were given.

## 2018-07-24 ENCOUNTER — Encounter (HOSPITAL_COMMUNITY): Payer: Self-pay

## 2018-07-24 ENCOUNTER — Other Ambulatory Visit: Payer: Self-pay

## 2018-07-24 ENCOUNTER — Other Ambulatory Visit (HOSPITAL_COMMUNITY): Payer: Medicare Other

## 2018-07-24 ENCOUNTER — Encounter (HOSPITAL_COMMUNITY)
Admission: RE | Admit: 2018-07-24 | Discharge: 2018-07-24 | Disposition: A | Discharge status: Home / Self Care | Payer: Medicare Other | Source: Ambulatory Visit | Attending: Surgery | Admitting: Surgery

## 2018-07-24 ENCOUNTER — Other Ambulatory Visit (HOSPITAL_COMMUNITY)
Admission: RE | Admit: 2018-07-24 | Discharge: 2018-07-24 | Disposition: A | Discharge status: Home / Self Care | Payer: Medicare Other | Source: Ambulatory Visit | Attending: Surgery | Admitting: Surgery

## 2018-07-24 DIAGNOSIS — C3431 Malignant neoplasm of lower lobe, right bronchus or lung: Secondary | ICD-10-CM | POA: Diagnosis not present

## 2018-07-24 DIAGNOSIS — I700 Atherosclerosis of aorta: Secondary | ICD-10-CM | POA: Diagnosis not present

## 2018-07-24 DIAGNOSIS — I2510 Atherosclerotic heart disease of native coronary artery without angina pectoris: Secondary | ICD-10-CM | POA: Diagnosis not present

## 2018-07-24 DIAGNOSIS — I2721 Secondary pulmonary arterial hypertension: Secondary | ICD-10-CM | POA: Diagnosis not present

## 2018-07-24 DIAGNOSIS — R001 Bradycardia, unspecified: Secondary | ICD-10-CM | POA: Diagnosis not present

## 2018-07-24 DIAGNOSIS — R918 Other nonspecific abnormal finding of lung field: Secondary | ICD-10-CM

## 2018-07-24 DIAGNOSIS — Z1159 Encounter for screening for other viral diseases: Secondary | ICD-10-CM | POA: Diagnosis not present

## 2018-07-24 DIAGNOSIS — E785 Hyperlipidemia, unspecified: Secondary | ICD-10-CM | POA: Diagnosis not present

## 2018-07-24 DIAGNOSIS — J90 Pleural effusion, not elsewhere classified: Secondary | ICD-10-CM | POA: Diagnosis not present

## 2018-07-24 DIAGNOSIS — Z951 Presence of aortocoronary bypass graft: Secondary | ICD-10-CM | POA: Diagnosis not present

## 2018-07-24 DIAGNOSIS — I10 Essential (primary) hypertension: Secondary | ICD-10-CM | POA: Diagnosis not present

## 2018-07-24 DIAGNOSIS — R110 Nausea: Secondary | ICD-10-CM | POA: Diagnosis not present

## 2018-07-24 DIAGNOSIS — J9811 Atelectasis: Secondary | ICD-10-CM | POA: Diagnosis not present

## 2018-07-24 LAB — CBC
HCT: 47.4 % (ref 39.0–52.0)
Hemoglobin: 15.9 g/dL (ref 13.0–17.0)
MCH: 32 pg (ref 26.0–34.0)
MCHC: 33.5 g/dL (ref 30.0–36.0)
MCV: 95.4 fL (ref 80.0–100.0)
Platelets: 253 10*3/uL (ref 150–400)
RBC: 4.97 MIL/uL (ref 4.22–5.81)
RDW: 12.5 % (ref 11.5–15.5)
WBC: 7.6 10*3/uL (ref 4.0–10.5)
nRBC: 0 % (ref 0.0–0.2)

## 2018-07-24 LAB — BLOOD GAS, ARTERIAL
Acid-Base Excess: 1.2 mmol/L (ref 0.0–2.0)
Bicarbonate: 25.2 mmol/L (ref 20.0–28.0)
Drawn by: 470591
FIO2: 21
O2 Saturation: 95.5 %
Patient temperature: 98.6
pCO2 arterial: 39.2 mmHg (ref 32.0–48.0)
pH, Arterial: 7.423 (ref 7.350–7.450)
pO2, Arterial: 80.4 mmHg — ABNORMAL LOW (ref 83.0–108.0)

## 2018-07-24 LAB — URINALYSIS, ROUTINE W REFLEX MICROSCOPIC
Bacteria, UA: NONE SEEN
Bilirubin Urine: NEGATIVE
Glucose, UA: NEGATIVE mg/dL
Ketones, ur: NEGATIVE mg/dL
Leukocytes,Ua: NEGATIVE
Nitrite: NEGATIVE
Protein, ur: NEGATIVE mg/dL
Specific Gravity, Urine: 1.019 (ref 1.005–1.030)
pH: 5 (ref 5.0–8.0)

## 2018-07-24 LAB — SARS CORONAVIRUS 2 (TAT 6-24 HRS): SARS Coronavirus 2: NEGATIVE

## 2018-07-24 LAB — PROTIME-INR
INR: 1 (ref 0.8–1.2)
Prothrombin Time: 13.3 seconds (ref 11.4–15.2)

## 2018-07-24 LAB — APTT: aPTT: 30 seconds (ref 24–36)

## 2018-07-24 LAB — SURGICAL PCR SCREEN
MRSA, PCR: NEGATIVE
Staphylococcus aureus: NEGATIVE

## 2018-07-24 NOTE — Progress Notes (Signed)
Will need a redraw CMP on DOS not enough sample to run

## 2018-07-24 NOTE — Progress Notes (Signed)
PCP - [name]  Cardiologist - Glenetta Hew   Chest x-ray - will need DOS patient stated that he may stop by on Friday to get it done since he will be here for his PFTs EKG - 07/24/18 Stress Test - 2020 ECHO - 2016 Cardiac Cath - 2016   Aspirin Instructions: continue but do not take the day of surgery  Anesthesia review: yes, hx CAD  Patient denies shortness of breath, fever, cough and chest pain at PAT appointment   Patient verbalized understanding of instructions that were given to them at the PAT appointment. Patient was also instructed that they will need to review over the PAT instructions again at home before surgery.

## 2018-07-25 ENCOUNTER — Other Ambulatory Visit: Payer: Self-pay | Admitting: Cardiology

## 2018-07-25 MED ORDER — LOSARTAN POTASSIUM 25 MG PO TABS: 25.0000 mg | ORAL_TABLET | Freq: Every day | ORAL | 3 refills | Status: DC

## 2018-07-25 NOTE — Progress Notes (Addendum)
Anesthesia Chart Review:  Case: [id number] Date/Time: 07/29/18 0715   Procedure: THORACOTOMY/RIGHT LOWER LOBECTOMY (Right )   Anesthesia type: General   Pre-op diagnosis: RLL LUNG MASS   Location: [facility] OR ROOM 68 / [place] OR   Surgeon: Gaye Pollack, MD      DISCUSSION: Patient is a 67 year old male scheduled for the above procedure. 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 The area was felt to be hard to biopsy and would be difficult to treat with radiation, so the above procedure recommended for definitive diagnosis.   History includes former smoker (quit '16), CAD (s/p CABG: LIMA-OM, RIMA-LAD, SVG-DIAG, SVG-PDA)06/04/14), ischemic cardiomyopathy (EF recovered after CABG), lung cancer (s/p right VATS/mini-thoracotomy, RLL wedge resection 07/10/16; pathology: invasive SCC involves visceral pleura).  He had a negative stress test earlier this year 03/2018 (done to evaluate for graft patency). He denied SOB and chest pain at PAT RN visit.   If no acute changes then I would anticipate that he could proceed as planned from an anesthesia standpoint. 07/24/18 presurgical COVID-19 test negative as he is scheduled for PFTs on 07/26/18. He is scheduled for a repeat COVID test on 07/27/18.     VS: BP (!) 142/68   Pulse 78   Temp 37 C   Resp 20   Ht 5\' 11"  (1.803 m)   Wt 72.3 kg   SpO2 98%   BMI 22.25 kg/m   PROVIDERS: [name], MD is PCP  Glenetta Hew, MD is cardiologist. Last visit 04/08/18.    LABS: Labs reviewed: Acceptable for surgery. CMET was done on 07/26/18 and was WNL. (all labs ordered are listed, but only abnormal results are displayed)  Labs Reviewed  BLOOD GAS, ARTERIAL - Abnormal; Notable for the following components:      Result Value   pO2, Arterial 80.4 (*)    All other components  within normal limits  URINALYSIS, ROUTINE W REFLEX MICROSCOPIC - Abnormal; Notable for the following components:   Hgb urine dipstick SMALL (*)    All other components within normal limits  SURGICAL PCR SCREEN  APTT  CBC  PROTIME-INR  TYPE AND SCREEN    PFTs 07/26/18: FVC 3.93 (84%), post 4.11 (87%). FEV1 1.90 (55%), post 1.99 (57%). DLCO unc 12.97 (47%), cor 12.53 (46%).   IMAGES: CXR 07/26/18: IMPRESSION: 1.  Prior CABG.  Heart size normal. 2. Postsurgical changes right lung. Pleural-parenchymal scarring. Reference is made to prior studies for discussion of PET positive pulmonary lesion on the right. This lesion is best demonstrated by prior PET-CT and chest CT. No new abnormality identified.  PET Scan 07/17/18: IMPRESSION: Hypermetabolism corresponding to the pleural-based nodularity within the medial portion of the superior segment right lower lobe. 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 Presuming non-small-cell histology, [id number] or stage IA.   EKG: 07/24/18: Normal sinus rhythm Normal ECG Incomplete right bundle branch block No significant change since last tracing Confirmed by Quay Burow [phone]) on 07/24/2018 11:50:13 AM   CV: Nuclear stress test 04/11/18: Study Highlights:  The left ventricular ejection fraction is mildly decreased (45-54%).  Nuclear stress EF: 52%.  Blood pressure demonstrated a normal response to exercise.  There was no ST segment deviation noted during stress.  No T wave inversion was noted during stress.  Defect 1: There is a small defect of  mild severity present in the basal inferior location.  This is a low risk study. Low normal LV EF, with septal dyskinesis consistent with post-CABG state. No evidence of ischemia or infarction. Resting images have diminished uptake seen at the inferior base, consistent with diaphragmatic attenuation. Low risk study.  Abdominal Aorta US 06/28/17: IMPRESSION: No  abdominal aortic aneurysm is seen.  Echo 10/29/14:  Study Conclusions - Left ventricle: The cavity size was normal. There was mild concentric hypertrophy. Systolic function was normal. The estimated ejection fraction was in the range of 50% to 55%. Mild hypokinesis of the basal anteroseptal myocardium. Doppler parameters are consistent with abnormal left ventricular relaxation (grade 1 diastolic dysfunction). - Mitral valve: There was trivial regurgitation. - Right ventricle: The cavity size was normal. Wall thickness was normal. Systolic function was mildly reduced. (Prevous EF 35-45% by cath 06/02/14.)  Last cath 06/02/14 prior to CABG.   Past Medical History:  Diagnosis Date  . Coronary artery disease, occlusive 06/02/2014   Multivessel CAD.mLAD-100%, mRCA 99%, dRCA 100%, mLCX 90% And EF 35-45%.  . Former heavy tobacco smoker    Quit in April 2016   . Hyperlipidemia with target LDL less than 70   . Ischemic cardiomyopathy - resolved 05/2014   Myoview: EF ~33% with "infarction vs. severe resting ischemia in LAD & RCA territory; b) EF by Cath: 35-45%. c) post CABG Echo 9016: EF 50-55%  . Lung nodules    right lung lower lobe  . 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 CARDIAC CATHETERIZATION N/A 06/02/2014   Procedure: Left Heart Cath and Coronary Angiography;  Surgeon: Leonie Man, MD;  Location: [place] [facility];  Service: Cardiovascular;  mLAD-100%, mRCA 99%, dRCA 100%, mLCX 90% And EF 35-45%.  . CORONARY ARTERY BYPASS GRAFT N/A 06/04/2014   Procedure: CORONARY ARTERY BYPASS GRAFTING (CABG) times four using bilateral internal mammary arteries and EVH for left  leg saphenous vein;  Surgeon: Gaye Pollack, MD;  Location: [facility] OR;  Service: Open Heart Surgery;  LIMA-OM, RIMA-LAD, SVG-Diag, SVG-rPDA  . NM MYOVIEW LTD  05/28/2014   Pre-CABG:  High Risk Nuclear Stress Test with  multivessel distribution ischemia. -- Severely ischemic Cardiomyoapthy with evidence of at least 2 vessel disease & EF of ~33%. Images are consistent with either infarction or severe resting ischemia in the LAD & RCA territory.  . TEE WITHOUT CARDIOVERSION N/A 06/04/2014   Procedure: TRANSESOPHAGEAL ECHOCARDIOGRAM (TEE);  Surgeon: Gaye Pollack, MD;  Location: [place];  Service: Open Heart Surgery;  Laterality: N/A;  . THORACOTOMY Right 07/10/2016   Procedure: THORACOTOMY;  Surgeon: Gaye Pollack, MD;  Location: [place];  Service: Thoracic;  Laterality: Right;  . TONSILLECTOMY  1957  . TRANSTHORACIC ECHOCARDIOGRAM  10/2014   Mild concentric LVH. EF 50-55% with mild HK of basal anteroseptal myocardium. GR 1 DD.  Marland Kitchen VIDEO ASSISTED THORACOSCOPY (VATS)/WEDGE RESECTION Right 07/10/2016   Procedure: VIDEO ASSISTED THORACOSCOPY (VATS)/WEDGE RESECTION;  Surgeon: Gaye Pollack, MD;  Location: [facility] OR;  Service: Thoracic;  Laterality: Right;    MEDICATIONS: . aspirin 81 MG tablet  . atorvastatin (LIPITOR) 40 MG tablet  . carvedilol (COREG) 3.125 MG tablet  . losartan (COZAAR) 25 MG tablet  . sildenafil (VIAGRA) 50 MG tablet   No current facility-administered medications for this encounter.     Myra Gianotti, PA-C Surgical Short Stay/Anesthesiology [facility] Phone [phone] [facility] Phone (  [phone] 07/26/2018 3:30 PM

## 2018-07-25 NOTE — Telephone Encounter (Signed)
Pt calling requesting a refill on Losartan, sent to [facility] mail order pharmacy. Please address

## 2018-07-26 ENCOUNTER — Other Ambulatory Visit: Payer: Self-pay

## 2018-07-26 ENCOUNTER — Ambulatory Visit (HOSPITAL_COMMUNITY)
Admission: RE | Admit: 2018-07-26 | Discharge: 2018-07-26 | Disposition: A | Discharge status: Home / Self Care | Payer: Medicare Other | Source: Ambulatory Visit | Attending: Surgery | Admitting: Surgery

## 2018-07-26 ENCOUNTER — Encounter (HOSPITAL_COMMUNITY)
Admission: RE | Admit: 2018-07-26 | Discharge: 2018-07-26 | Disposition: A | Discharge status: Home / Self Care | Payer: Medicare Other | Source: Ambulatory Visit | Attending: Surgery | Admitting: Surgery

## 2018-07-26 ENCOUNTER — Encounter (HOSPITAL_COMMUNITY): Payer: Self-pay

## 2018-07-26 DIAGNOSIS — R918 Other nonspecific abnormal finding of lung field: Secondary | ICD-10-CM

## 2018-07-26 DIAGNOSIS — Z01818 Encounter for other preprocedural examination: Secondary | ICD-10-CM | POA: Insufficient documentation

## 2018-07-26 DIAGNOSIS — Z1159 Encounter for screening for other viral diseases: Secondary | ICD-10-CM | POA: Insufficient documentation

## 2018-07-26 LAB — PULMONARY FUNCTION TEST
DL/VA % pred: 57 %
DL/VA: 2.34 ml/min/mmHg/L
DLCO cor % pred: 46 %
DLCO cor: 12.53 ml/min/mmHg
DLCO unc % pred: 47 %
DLCO unc: 12.97 ml/min/mmHg
FEF 25-75 Post: 0.74 L/sec
FEF 25-75 Pre: 0.63 L/sec
FEF2575-%Change-Post: 16 %
FEF2575-%Pred-Post: 27 %
FEF2575-%Pred-Pre: 23 %
FEV1-%Change-Post: 4 %
FEV1-%Pred-Post: 57 %
FEV1-%Pred-Pre: 55 %
FEV1-Post: 1.99 L
FEV1-Pre: 1.9 L
FEV1FVC-%Change-Post: 0 %
FEV1FVC-%Pred-Pre: 65 %
FEV6-%Change-Post: 3 %
FEV6-%Pred-Post: 80 %
FEV6-%Pred-Pre: 77 %
FEV6-Post: 3.58 L
FEV6-Pre: 3.45 L
FEV6FVC-%Change-Post: 0 %
FEV6FVC-%Pred-Post: 92 %
FEV6FVC-%Pred-Pre: 92 %
FVC-%Change-Post: 4 %
FVC-%Pred-Post: 87 %
FVC-%Pred-Pre: 84 %
FVC-Post: 4.11 L
FVC-Pre: 3.93 L
Post FEV1/FVC ratio: 48 %
Post FEV6/FVC ratio: 87 %
Pre FEV1/FVC ratio: 48 %
Pre FEV6/FVC Ratio: 88 %
RV % pred: 87 %
RV: 2.16 L
TLC % pred: 85 %
TLC: 6.19 L

## 2018-07-26 LAB — COMPREHENSIVE METABOLIC PANEL
ALT: 23 U/L (ref 0–44)
AST: 27 U/L (ref 15–41)
Albumin: 4 g/dL (ref 3.5–5.0)
Alkaline Phosphatase: 74 U/L (ref 38–126)
Anion gap: 10 (ref 5–15)
BUN: 20 mg/dL (ref 8–23)
CO2: 26 mmol/L (ref 22–32)
Calcium: 9.4 mg/dL (ref 8.9–10.3)
Chloride: 104 mmol/L (ref 98–111)
Creatinine, Ser: 1.06 mg/dL (ref 0.61–1.24)
GFR calc Af Amer: >60 mL/min (ref >60)
GFR calc non Af Amer: >60 mL/min (ref >60)
Glucose, Bld: 95 mg/dL (ref 70–99)
Potassium: 3.6 mmol/L (ref 3.5–5.1)
Sodium: 140 mmol/L (ref 135–145)
Total Bilirubin: 0.9 mg/dL (ref 0.3–1.2)
Total Protein: 6.8 g/dL (ref 6.5–8.1)

## 2018-07-26 MED ORDER — ALBUTEROL SULFATE (2.5 MG/3ML) 0.083% IN NEBU
2.5000 mg | INHALATION_SOLUTION | Freq: Once | RESPIRATORY_TRACT | Status: AC
  Administered 2018-07-26: 2.5 mg via RESPIRATORY_TRACT

## 2018-07-26 NOTE — Anesthesia Preprocedure Evaluation (Addendum)
Anesthesia Evaluation  Patient identified by MRN, date of birth, ID band Patient awake    Reviewed: Allergy & Precautions, H&P , NPO status , Patient's Chart, lab work & pertinent test results, reviewed documented beta blocker date and time   Airway Mallampati: II  TM Distance: >3 FB Neck ROM: Full    Dental no notable dental hx. (+) Teeth Intact, Dental Advisory Given   Pulmonary neg pulmonary ROS, former smoker,    Pulmonary exam normal breath sounds clear to auscultation       Cardiovascular Exercise Tolerance: Good hypertension, Pt. on medications and Pt. on home beta blockers + CAD and + CABG   Rhythm:Regular Rate:Normal     Neuro/Psych negative neurological ROS  negative psych ROS   GI/Hepatic negative GI ROS, Neg liver ROS,   Endo/Other  negative endocrine ROS  Renal/GU negative Renal ROS  negative genitourinary   Musculoskeletal   Abdominal   Peds  Hematology negative hematology ROS (+)   Anesthesia Other Findings   Reproductive/Obstetrics negative OB ROS                           Anesthesia Physical Anesthesia Plan  ASA: III  Anesthesia Plan: General   Post-op Pain Management:    Induction: Intravenous  PONV Risk Score and Plan: 3 and Ondansetron, Dexamethasone and Midazolam  Airway Management Planned: Double Lumen EBT  Additional Equipment: Arterial line, CVP and Ultrasound Guidance Line Placement  Intra-op Plan:   Post-operative Plan: Extubation in OR  Informed Consent: I have reviewed the patients History and Physical, chart, labs and discussed the procedure including the risks, benefits and alternatives for the proposed anesthesia with the patient or authorized representative who has indicated his/her understanding and acceptance.     Dental advisory given  Plan Discussed with: CRNA  Anesthesia Plan Comments: (PAT note written 07/26/2018 by Myra Gianotti,  PA-C. )       Anesthesia Quick Evaluation

## 2018-07-27 ENCOUNTER — Other Ambulatory Visit (HOSPITAL_COMMUNITY)
Admission: RE | Admit: 2018-07-27 | Discharge: 2018-07-27 | Disposition: A | Discharge status: Home / Self Care | Payer: Medicare Other | Source: Ambulatory Visit | Attending: Surgery | Admitting: Surgery

## 2018-07-28 LAB — SARS CORONAVIRUS 2 (TAT 6-24 HRS): SARS Coronavirus 2: NEGATIVE

## 2018-07-28 NOTE — H&P (Addendum)
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 A routine surveillance chest CT showed an irregular nodular subpleural soft tissue density in the medial aspect of the superior segment of the right lower lobe adjacent to the spine which is increased in size from his prior CT scan on 12/12/2017.  This was barely visible at that time. PET scan shows this lesion to be hypermetabolic with an SUV max of 6.4 which is suspicious for primary bronchogenic carcinoma. There are no other areas of hypermetabolism on the PET scan.   Past Medical History:  Diagnosis Date  . Coronary artery disease, occlusive 06/02/2014   Multivessel CAD.mLAD-100%, mRCA 99%, dRCA 100%, mLCX 90% And EF 35-45%.  . Former heavy tobacco smoker    Quit in April 2016   . Hyperlipidemia with target LDL less than 70   . Ischemic cardiomyopathy - resolved 05/2014   Myoview: EF ~33% with "infarction vs. severe resting ischemia in LAD & RCA territory; b) EF by Cath: 35-45%. c) post CABG Echo 9016: EF 50-55%  . Lung nodules    right lung lower lobe  . 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 CARDIAC CATHETERIZATION N/A 06/02/2014   Procedure: Left Heart Cath and Coronary Angiography;  Surgeon: Leonie Man, MD;  Location: [facility];  Service: Cardiovascular;  mLAD-100%, mRCA 99%, dRCA 100%, mLCX 90% And EF 35-45%.  . CORONARY ARTERY BYPASS GRAFT N/A 06/04/2014   Procedure: CORONARY ARTERY BYPASS GRAFTING (CABG) times four using  bilateral internal mammary arteries and EVH for left  leg saphenous vein;  Surgeon: Gaye Pollack, MD;  Location: [facility];  Service: Open Heart Surgery;  LIMA-OM, RIMA-LAD, SVG-Diag, SVG-rPDA  . NM MYOVIEW LTD  05/28/2014   Pre-CABG:  High Risk Nuclear Stress Test with multivessel distribution ischemia. -- Severely ischemic Cardiomyoapthy with evidence of at least 2 vessel disease & EF of ~33%. Images are consistent with either infarction or severe resting ischemia in the LAD & RCA territory.  . TEE WITHOUT CARDIOVERSION N/A 06/04/2014   Procedure: TRANSESOPHAGEAL ECHOCARDIOGRAM (TEE);  Surgeon: Gaye Pollack, MD;  Location: [facility];  Service: Open Heart Surgery;  Laterality: N/A;  . THORACOTOMY Right 07/10/2016   Procedure: THORACOTOMY;  Surgeon: Gaye Pollack, MD;  Location: [facility];  Service: Thoracic;  Laterality: Right;  . TONSILLECTOMY  1957  . TRANSTHORACIC ECHOCARDIOGRAM  10/2014   Mild concentric LVH. EF 50-55% with mild HK of basal anteroseptal myocardium. GR 1 DD.  Marland Kitchen VIDEO ASSISTED THORACOSCOPY (VATS)/WEDGE RESECTION Right 07/10/2016   Procedure: VIDEO ASSISTED THORACOSCOPY (VATS)/WEDGE RESECTION;  Surgeon: Gaye Pollack, MD;  Location: [facility];  Service: Thoracic;  Laterality: Right;    Family History  Problem Relation Age of Onset  . Emphysema Mother   . Cardiomyopathy Father   . Healthy Sister   . Heart attack Maternal Grandfather   . Healthy Sister   .  Heart disease Paternal Uncle    Social History:  reports that he quit smoking about 4 years ago. His smoking use included cigarettes. He has a 36.80 pack-year smoking history. He has never used smokeless tobacco. He reports current alcohol use of about 7.0 standard drinks of alcohol per week. He reports that he does not use drugs.  Allergies: No Known Allergies  No medications prior to admission.    Results for orders placed or performed during the hospital encounter of 07/27/18 (from the past 48 hour(s))  SARS Coronavirus 2  (Performed in [place] hospital lab)     Status: None   Collection Time: 07/27/18  4:10 PM   Specimen: Nasal Swab  Result Value Ref Range   SARS Coronavirus 2 NEGATIVE NEGATIVE    Comment: (NOTE) SARS-CoV-2 target nucleic acids are NOT DETECTED. The SARS-CoV-2 RNA is generally detectable in upper and lower respiratory specimens during the acute phase of infection. Negative results do not preclude SARS-CoV-2 infection, do not rule out co-infections with other pathogens, and should not be used as the sole basis for treatment or other patient management decisions. Negative results must be combined with clinical observations, patient history, and epidemiological information. The expected result is Negative. Fact Sheet for Patients: SugarRoll.be Fact Sheet for Healthcare Providers: https://www.woods-mathews.com/ This test is not yet approved or cleared by the [place] FDA and  has been authorized for detection and/or diagnosis of SARS-CoV-2 by FDA under an Emergency Use Authorization (EUA). This EUA will remain  in effect (meaning this test can be used) for the duration of the COVID-19 declaration under Section 56 4(b)(1) of the Act, 21 U.S.C. section 360bbb-3(b)(1), unless the authorization is terminated or revoked sooner. Performed at [facility], [place] [address], [place], [place] [address]    No results found.  Review of Systems  Constitutional: Negative.   HENT: Negative.   Eyes: Negative.   Respiratory: Negative.   Cardiovascular: Negative.   Gastrointestinal: Negative.   Genitourinary: Negative.   Musculoskeletal: Negative.   Skin: Negative.   Neurological: Negative.   Endo/Heme/Allergies: Negative.   Psychiatric/Behavioral: Negative.     There were no vitals taken for this visit. Physical Exam  Constitutional: He is oriented to person, place, and time. He appears well-developed and well-nourished.  HENT:   Head: Normocephalic and atraumatic.  Eyes: Pupils are equal, round, and reactive to light. EOM are normal.  Neck: Normal range of motion. Neck supple. No thyromegaly present.  Cardiovascular: Normal rate, regular rhythm, normal heart sounds and intact distal pulses.  No murmur heard. Respiratory: Effort normal and breath sounds normal. No respiratory distress.  Old right thoracotomy scar  Old median sternotomy scar  GI: Soft. Bowel sounds are normal. He exhibits no distension. There is no abdominal tenderness.  Musculoskeletal: Normal range of motion.        General: No edema.  Lymphadenopathy:    He has no cervical adenopathy.  Neurological: He is alert and oriented to person, place, and time. He has normal strength. No cranial nerve deficit or sensory deficit.  Skin: Skin is warm and dry.  Psychiatric: He has a normal mood and affect. His behavior is normal. Judgment and thought content normal.    CLINICAL DATA:  Lung nodules, history of right lung cancer.  EXAM: CT CHEST WITHOUT CONTRAST  TECHNIQUE: Multidetector CT imaging of the chest was performed following the standard protocol without IV contrast.  COMPARISON:  12/12/2017 cough 06/20/2017 and 11/15/2016.  FINDINGS: Cardiovascular: Atherosclerotic calcification  of the aorta. Pulmonary arteries are enlarged. Heart size normal. No pericardial effusion.  Mediastinum/Nodes: No pathologically enlarged mediastinal or axillary lymph nodes. Hilar regions are difficult to definitively evaluate without IV contrast. Esophagus is grossly unremarkable.  Lungs/Pleura: Centrilobular emphysema. Mild biapical pleuroparenchymal scarring. Irregular subpleural nodule in the medial aspect of the superior segment right lower lobe has enlarged, now measuring 0.9 x 1.6 cm (series 8, image 87), previously 0.4 x 0.9 cm on 12/12/2017. Postoperative scarring in the right lower lobe. Additional parenchymal and subpleural nodular densities  measure up to 10 mm in the peripheral left lower lobe (122), stable from 12/12/2017. No pleural fluid. Airway is unremarkable.  Upper Abdomen: Subcentimeter low-attenuation lesion in the periphery of the right hepatic lobe is unchanged from 11/15/2016, indicative of a benign lesion. Visualized portions of the liver, gallbladder, adrenal glands, kidneys, spleen, pancreas, stomach and bowel are otherwise grossly unremarkable. No upper abdominal adenopathy.  Musculoskeletal: No worrisome lytic or sclerotic lesions.  IMPRESSION: 1. Irregular nodular subpleural soft tissue in the medial aspect of the superior segment right lower lobe, increased in size from 12/12/2017 and worrisome for primary bronchogenic carcinoma. Lesion appears more solid than would be expected with paraspinal scarring. 2. Additional bilateral pulmonary nodules are stable. 3.  Aortic atherosclerosis (ICD10-170.0). 4. Enlarged pulmonary arteries, indicative of pulmonary arterial hypertension. 5.  Emphysema (ICD10-J43.9).   Electronically Signed   By: Lorin Picket M.D.   On: 07/10/2018 09:14  CLINICAL DATA:  Subsequent treatment strategy for restaging of lung nodule. History of the vats and wedge resection on the right on 07/10/2016.  EXAM: NUCLEAR MEDICINE PET SKULL BASE TO THIGH  TECHNIQUE: 8.0 mCi F-18 FDG was injected intravenously. Full-ring PET imaging was performed from the skull base to thigh after the radiotracer. CT data was obtained and used for attenuation correction and anatomic localization.  Fasting blood glucose: 115 mg/dl  COMPARISON:  Chest CT of 07/10/2018.  PET of 07/04/2016.  FINDINGS: Mediastinal blood pool activity: SUV max 2.2  Liver activity: SUV max NA  NECK: No areas of abnormal hypermetabolism.  Incidental CT findings: No cervical adenopathy. Bilateral carotid atherosclerosis.  CHEST: Hypermetabolism corresponding to the area of pleural-based nodularity  within the medial portion of the superior segment right lower lobe. Example of 1.4 cm and a S.U.V. max of 6.4 on image 40/8.  No thoracic nodal hypermetabolism.  Incidental CT findings: Deferred to recent diagnostic CT. Centrilobular emphysema. Right lower lobe wedge resection. Aortic atherosclerosis.  ABDOMEN/PELVIS: No abdominopelvic parenchymal or nodal hypermetabolism.  Incidental CT findings: Normal adrenal glands. Abdominal aortic atherosclerosis. Extensive colonic diverticulosis.  SKELETON: No abnormal marrow activity.  Incidental CT findings: none  IMPRESSION: Hypermetabolism corresponding to the pleural-based nodularity within the medial portion of the superior segment right lower lobe. 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 Presuming non-small-cell histology, T1bN0M0 or stage IA.   Electronically Signed   By: Abigail Miyamoto M.D.   On: 07/17/2018 11:37  Status:  Edited Result - FINAL Visible to patient:  No (not released) Next appt:  None Dx:  Lung mass  Ref Range & Units 2d ago 49yr ago 45yr ago  FVC-Pre L 3.93  3.86  4.03   FVC-%Pred-Pre % 84  84  86   FVC-Post L 4.11  4.38  4.37   FVC-%Pred-Post % 87  96  94   FVC-%Change-Post % 4  13  8    FEV1-Pre L 1.90  2.07  2.27   FEV1-%Pred-Pre % 55  60  65  FEV1-Post L 1.99  2.30  2.48   FEV1-%Pred-Post % 57  67  71   FEV1-%Change-Post % 4  11  9    FEV6-Pre L 3.45  3.58  3.78   FEV6-%Pred-Pre % 77  82  86   FEV6-Post L 3.58  3.86  4.07   FEV6-%Pred-Post % 80  89  92   FEV6-%Change-Post % 3  7  7    Pre FEV1/FVC ratio % 48  54  56   FEV1FVC-%Pred-Pre % 65  72  75   Post FEV1/FVC ratio % 48  52  57   FEV1FVC-%Change-Post % 0  -2  0   Pre FEV6/FVC Ratio % 88  93  94   FEV6FVC-%Pred-Pre % 92  97  98   Post FEV6/FVC ratio % 87  88  93   FEV6FVC-%Pred-Post % 92  92  97   FEV6FVC-%Change-Post % 0  -5  0   FEF 25-75 Pre L/sec 0.63  0.78  0.86   FEF2575-%Pred-Pre % 23  29  31    FEF  25-75 Post L/sec 0.74  1.10  1.26   FEF2575-%Pred-Post % 27  41  45   FEF2575-%Change-Post % 16  42  45   RV L 2.16  2.23  1.48   RV % pred % 87  94  63   TLC L 6.19  6.39  6.16   TLC % pred % 85  91  87   DLCO unc ml/min/mmHg 12.97  11.88  18.55   DLCO unc % pred % 47  36  57   DLCO cor ml/min/mmHg 12.53     DLCO cor % pred % 46     DL/VA ml/min/mmHg/L 2.34  2.14  3.20   DL/VA % pred % 57  46  69   Resulting Agency  [facility]      Specimen Collected: 07/26/18 13:49 Last Resulted: 07/26/18 14:44          Assessment/Plan  This 68 year old gentleman has a new 1.6 cm irregular nodular subpleural mass in the medial aspect of the superior segment of the right lower lobe which is increased in size compared to his prior CT in November 2019.  PET scan shows this lesion to be hypermetabolic with an SUV max of 6.4 which is suspicious for primary bronchogenic carcinoma.  There are no other areas of hypermetabolism on the PET scan.  This lesion is in an area that would be difficult to biopsy with a high likelihood of a false negative result.  I also think it is in a difficult area to treat with radiation therapy.  He is in overall good condition and I think is a suitable candidate for right thoracotomy and right lower lobectomy.  He had a negative nuclear stress test by Dr. Ellyn Hack on 04/11/2018.  I do not think this lesion will be amenable to wedge resection given its location.  His pulmonary function test shows adequate pulmonary function to allow right lower lobectomy.  I think his best long-term prognosis is going to be with surgical resection of this lesion.  I reviewed the PET scan images with the patient and discussed treatment options with him and his wife by conference call.  I discussed the surgical procedure of a right lower lobectomy including alternatives, benefits, and risks including but not limited to bleeding, blood transfusion, infection, prolonged air leak, pleural space  complications, and the possibility that he may develop another cancer in the future.  He understands  and agrees to proceed.   Gaye Pollack, MD

## 2018-07-29 ENCOUNTER — Encounter (HOSPITAL_COMMUNITY)
Admission: RE | Disposition: A | Discharge status: Home / Self Care | Payer: Self-pay | Source: Home / Self Care | Attending: Surgery

## 2018-07-29 ENCOUNTER — Inpatient Hospital Stay (HOSPITAL_COMMUNITY): Payer: Medicare Other

## 2018-07-29 ENCOUNTER — Encounter (HOSPITAL_COMMUNITY): Payer: Self-pay

## 2018-07-29 ENCOUNTER — Inpatient Hospital Stay (HOSPITAL_COMMUNITY): Payer: Medicare Other | Admitting: Anesthesiology

## 2018-07-29 ENCOUNTER — Other Ambulatory Visit: Payer: Self-pay

## 2018-07-29 ENCOUNTER — Inpatient Hospital Stay (HOSPITAL_COMMUNITY): Payer: Medicare Other | Admitting: Vascular Surgery

## 2018-07-29 ENCOUNTER — Inpatient Hospital Stay (HOSPITAL_COMMUNITY)
Admission: RE | Admit: 2018-07-29 | Discharge: 2018-08-02 | DRG: 164 | Disposition: A | Discharge status: Home / Self Care | Payer: Medicare Other | Attending: Surgery | Admitting: Surgery

## 2018-07-29 DIAGNOSIS — J90 Pleural effusion, not elsewhere classified: Secondary | ICD-10-CM | POA: Diagnosis not present

## 2018-07-29 DIAGNOSIS — R001 Bradycardia, unspecified: Secondary | ICD-10-CM | POA: Diagnosis not present

## 2018-07-29 DIAGNOSIS — Z8249 Family history of ischemic heart disease and other diseases of the circulatory system: Secondary | ICD-10-CM

## 2018-07-29 DIAGNOSIS — I700 Atherosclerosis of aorta: Secondary | ICD-10-CM | POA: Diagnosis present

## 2018-07-29 DIAGNOSIS — E785 Hyperlipidemia, unspecified: Secondary | ICD-10-CM | POA: Diagnosis present

## 2018-07-29 DIAGNOSIS — R222 Localized swelling, mass and lump, trunk: Secondary | ICD-10-CM | POA: Diagnosis not present

## 2018-07-29 DIAGNOSIS — Z902 Acquired absence of lung [part of]: Secondary | ICD-10-CM | POA: Diagnosis not present

## 2018-07-29 DIAGNOSIS — I255 Ischemic cardiomyopathy: Secondary | ICD-10-CM | POA: Diagnosis not present

## 2018-07-29 DIAGNOSIS — S2231XA Fracture of one rib, right side, initial encounter for closed fracture: Secondary | ICD-10-CM | POA: Diagnosis not present

## 2018-07-29 DIAGNOSIS — Z825 Family history of asthma and other chronic lower respiratory diseases: Secondary | ICD-10-CM

## 2018-07-29 DIAGNOSIS — I2510 Atherosclerotic heart disease of native coronary artery without angina pectoris: Secondary | ICD-10-CM | POA: Diagnosis present

## 2018-07-29 DIAGNOSIS — Z79899 Other long term (current) drug therapy: Secondary | ICD-10-CM

## 2018-07-29 DIAGNOSIS — R918 Other nonspecific abnormal finding of lung field: Secondary | ICD-10-CM | POA: Diagnosis not present

## 2018-07-29 DIAGNOSIS — J939 Pneumothorax, unspecified: Secondary | ICD-10-CM

## 2018-07-29 DIAGNOSIS — Z4682 Encounter for fitting and adjustment of non-vascular catheter: Secondary | ICD-10-CM | POA: Diagnosis not present

## 2018-07-29 DIAGNOSIS — R110 Nausea: Secondary | ICD-10-CM | POA: Diagnosis not present

## 2018-07-29 DIAGNOSIS — I10 Essential (primary) hypertension: Secondary | ICD-10-CM | POA: Diagnosis present

## 2018-07-29 DIAGNOSIS — Z951 Presence of aortocoronary bypass graft: Secondary | ICD-10-CM | POA: Diagnosis not present

## 2018-07-29 DIAGNOSIS — Z87891 Personal history of nicotine dependence: Secondary | ICD-10-CM

## 2018-07-29 DIAGNOSIS — C3431 Malignant neoplasm of lower lobe, right bronchus or lung: Principal | ICD-10-CM | POA: Diagnosis present

## 2018-07-29 DIAGNOSIS — R091 Pleurisy: Secondary | ICD-10-CM | POA: Diagnosis not present

## 2018-07-29 DIAGNOSIS — I2721 Secondary pulmonary arterial hypertension: Secondary | ICD-10-CM | POA: Diagnosis present

## 2018-07-29 DIAGNOSIS — J9811 Atelectasis: Secondary | ICD-10-CM | POA: Diagnosis not present

## 2018-07-29 DIAGNOSIS — Z1159 Encounter for screening for other viral diseases: Secondary | ICD-10-CM | POA: Diagnosis not present

## 2018-07-29 DIAGNOSIS — J439 Emphysema, unspecified: Secondary | ICD-10-CM | POA: Diagnosis not present

## 2018-07-29 HISTORY — PX: THORACOTOMY/LOBECTOMY: SHX6116

## 2018-07-29 LAB — GLUCOSE, CAPILLARY
Glucose-Capillary: 108 mg/dL — ABNORMAL HIGH (ref 70–99)
Glucose-Capillary: 174 mg/dL — ABNORMAL HIGH (ref 70–99)
Glucose-Capillary: 206 mg/dL — ABNORMAL HIGH (ref 70–99)
Glucose-Capillary: 169 mg/dL — ABNORMAL HIGH (ref 70–99)

## 2018-07-29 LAB — PREPARE RBC (CROSSMATCH)

## 2018-07-29 SURGERY — LOBECTOMY, LUNG, OPEN
Anesthesia: General | Site: Chest | Laterality: Right

## 2018-07-29 MED ORDER — SODIUM CHLORIDE 0.9% FLUSH: 9.0000 mL | INTRAVENOUS | Status: DC | PRN

## 2018-07-29 MED ORDER — ENOXAPARIN SODIUM 40 MG/0.4ML ~~LOC~~ SOLN
40.0000 mg | Freq: Every day | SUBCUTANEOUS | Status: DC
  Administered 2018-07-30 – 2018-07-31 (×2): 40 mg via SUBCUTANEOUS
  Filled 2018-07-29 (×2): qty 0.4

## 2018-07-29 MED ORDER — BISACODYL 5 MG PO TBEC
10.0000 mg | DELAYED_RELEASE_TABLET | Freq: Every day | ORAL | Status: DC
  Administered 2018-07-29 – 2018-07-31 (×3): 10 mg via ORAL
  Filled 2018-07-29 (×3): qty 2

## 2018-07-29 MED ORDER — 0.9 % SODIUM CHLORIDE (POUR BTL) OPTIME
TOPICAL | Status: DC | PRN
  Administered 2018-07-29: 07:00:00 2000 mL

## 2018-07-29 MED ORDER — ALBUTEROL SULFATE (2.5 MG/3ML) 0.083% IN NEBU
2.5000 mg | INHALATION_SOLUTION | RESPIRATORY_TRACT | Status: DC
  Administered 2018-07-29: 2.5 mg via RESPIRATORY_TRACT
  Filled 2018-07-29: qty 3

## 2018-07-29 MED ORDER — MORPHINE SULFATE 2 MG/ML IV SOLN
INTRAVENOUS | Status: DC
  Administered 2018-07-29: 11:00:00 via INTRAVENOUS

## 2018-07-29 MED ORDER — ONDANSETRON HCL 4 MG/2ML IJ SOLN: 4.0000 mg | Freq: Four times a day (QID) | INTRAMUSCULAR | Status: DC | PRN

## 2018-07-29 MED ORDER — ASPIRIN EC 81 MG PO TBEC
81.0000 mg | DELAYED_RELEASE_TABLET | Freq: Every day | ORAL | Status: DC
  Administered 2018-07-30 – 2018-08-01 (×3): 81 mg via ORAL
  Filled 2018-07-29 (×4): qty 1

## 2018-07-29 MED ORDER — ACETAMINOPHEN 500 MG PO TABS
1000.0000 mg | ORAL_TABLET | Freq: Four times a day (QID) | ORAL | Status: DC
  Administered 2018-07-29 – 2018-08-02 (×13): 1000 mg via ORAL
  Filled 2018-07-29 (×15): qty 2

## 2018-07-29 MED ORDER — DIPHENHYDRAMINE HCL 50 MG/ML IJ SOLN: 12.5000 mg | Freq: Four times a day (QID) | INTRAMUSCULAR | Status: DC | PRN

## 2018-07-29 MED ORDER — NALOXONE HCL 0.4 MG/ML IJ SOLN
0.4000 mg | INTRAMUSCULAR | Status: DC | PRN
  Filled 2018-07-29: qty 1

## 2018-07-29 MED ORDER — ACETAMINOPHEN 500 MG PO TABS
ORAL_TABLET | ORAL | Status: AC
  Administered 2018-07-29: 1000 mg via ORAL
  Filled 2018-07-29: qty 2

## 2018-07-29 MED ORDER — INSULIN ASPART 100 UNIT/ML ~~LOC~~ SOLN
0.0000 [IU] | SUBCUTANEOUS | Status: DC
  Administered 2018-07-29: 4 [IU] via SUBCUTANEOUS
  Administered 2018-07-29: 8 [IU] via SUBCUTANEOUS
  Administered 2018-07-29: 4 [IU] via SUBCUTANEOUS
  Administered 2018-07-30: 2 [IU] via SUBCUTANEOUS

## 2018-07-29 MED ORDER — FENTANYL CITRATE (PF) 250 MCG/5ML IJ SOLN
INTRAMUSCULAR | Status: DC | PRN
  Administered 2018-07-29 (×2): 50 ug via INTRAVENOUS
  Administered 2018-07-29 (×2): 100 ug via INTRAVENOUS
  Administered 2018-07-29 (×3): 50 ug via INTRAVENOUS

## 2018-07-29 MED ORDER — LACTATED RINGERS IV SOLN
INTRAVENOUS | Status: DC | PRN
  Administered 2018-07-29: 07:00:00 via INTRAVENOUS

## 2018-07-29 MED ORDER — SODIUM CHLORIDE 0.9% IV SOLUTION: Freq: Once | INTRAVENOUS | Status: DC

## 2018-07-29 MED ORDER — CEFAZOLIN SODIUM-DEXTROSE 2-4 GM/100ML-% IV SOLN
2.0000 g | Freq: Three times a day (TID) | INTRAVENOUS | Status: AC
  Administered 2018-07-29 (×2): 2 g via INTRAVENOUS
  Filled 2018-07-29 (×2): qty 100

## 2018-07-29 MED ORDER — MORPHINE SULFATE 2 MG/ML IV SOLN
INTRAVENOUS | Status: AC
  Filled 2018-07-29: qty 30

## 2018-07-29 MED ORDER — ROCURONIUM BROMIDE 10 MG/ML (PF) SYRINGE
PREFILLED_SYRINGE | INTRAVENOUS | Status: AC
  Filled 2018-07-29: qty 20

## 2018-07-29 MED ORDER — BUPIVACAINE HCL 0.5 % IJ SOLN
INTRAMUSCULAR | Status: DC | PRN
  Administered 2018-07-29: 5 mL

## 2018-07-29 MED ORDER — SUCCINYLCHOLINE CHLORIDE 200 MG/10ML IV SOSY
PREFILLED_SYRINGE | INTRAVENOUS | Status: AC
  Filled 2018-07-29: qty 10

## 2018-07-29 MED ORDER — BUPIVACAINE 0.5 % ON-Q PUMP SINGLE CATH 400 ML
400.0000 mL | INJECTION | Status: DC
  Filled 2018-07-29: qty 400

## 2018-07-29 MED ORDER — SODIUM CHLORIDE 0.9 % IV SOLN
INTRAVENOUS | Status: DC | PRN
  Administered 2018-07-29: 25 ug/min via INTRAVENOUS

## 2018-07-29 MED ORDER — ALBUTEROL SULFATE (2.5 MG/3ML) 0.083% IN NEBU
2.5000 mg | INHALATION_SOLUTION | RESPIRATORY_TRACT | Status: DC
  Administered 2018-07-29 – 2018-07-30 (×2): 2.5 mg via RESPIRATORY_TRACT
  Filled 2018-07-29 (×2): qty 3

## 2018-07-29 MED ORDER — MORPHINE SULFATE 2 MG/ML IV SOLN
INTRAVENOUS | Status: DC
  Administered 2018-07-31: 0 mg via INTRAVENOUS

## 2018-07-29 MED ORDER — ONDANSETRON HCL 4 MG/2ML IJ SOLN
INTRAMUSCULAR | Status: DC | PRN
  Administered 2018-07-29: 4 mg via INTRAVENOUS

## 2018-07-29 MED ORDER — CEFAZOLIN SODIUM-DEXTROSE 2-4 GM/100ML-% IV SOLN
INTRAVENOUS | Status: AC
  Filled 2018-07-29: qty 100

## 2018-07-29 MED ORDER — CEFAZOLIN SODIUM-DEXTROSE 2-4 GM/100ML-% IV SOLN
2.0000 g | INTRAVENOUS | Status: AC
  Administered 2018-07-29: 2 g via INTRAVENOUS

## 2018-07-29 MED ORDER — PROPOFOL 10 MG/ML IV BOLUS
INTRAVENOUS | Status: DC | PRN
  Administered 2018-07-29: 130 mg via INTRAVENOUS
  Administered 2018-07-29: 20 mg via INTRAVENOUS

## 2018-07-29 MED ORDER — ROCURONIUM BROMIDE 50 MG/5ML IV SOSY
PREFILLED_SYRINGE | INTRAVENOUS | Status: DC | PRN
  Administered 2018-07-29: 50 mg via INTRAVENOUS
  Administered 2018-07-29 (×2): 20 mg via INTRAVENOUS
  Administered 2018-07-29: 10 mg via INTRAVENOUS

## 2018-07-29 MED ORDER — DEXAMETHASONE SODIUM PHOSPHATE 10 MG/ML IJ SOLN
INTRAMUSCULAR | Status: AC
  Filled 2018-07-29: qty 1

## 2018-07-29 MED ORDER — PROPOFOL 10 MG/ML IV BOLUS
INTRAVENOUS | Status: AC
  Filled 2018-07-29: qty 20

## 2018-07-29 MED ORDER — SUGAMMADEX SODIUM 200 MG/2ML IV SOLN
INTRAVENOUS | Status: DC | PRN
  Administered 2018-07-29: 144.6 mg via INTRAVENOUS

## 2018-07-29 MED ORDER — MIDAZOLAM HCL 5 MG/5ML IJ SOLN
INTRAMUSCULAR | Status: DC | PRN
  Administered 2018-07-29 (×2): 1 mg via INTRAVENOUS

## 2018-07-29 MED ORDER — CARVEDILOL 6.25 MG PO TABS
6.2500 mg | ORAL_TABLET | Freq: Two times a day (BID) | ORAL | Status: DC
  Administered 2018-07-29: 6.25 mg via ORAL
  Filled 2018-07-29: qty 1

## 2018-07-29 MED ORDER — ONDANSETRON HCL 4 MG/2ML IJ SOLN
4.0000 mg | Freq: Four times a day (QID) | INTRAMUSCULAR | Status: DC | PRN
  Administered 2018-07-31 (×2): 4 mg via INTRAVENOUS
  Filled 2018-07-29 (×3): qty 2

## 2018-07-29 MED ORDER — TRAMADOL HCL 50 MG PO TABS: 50.0000 mg | ORAL_TABLET | Freq: Four times a day (QID) | ORAL | Status: DC | PRN

## 2018-07-29 MED ORDER — PHENYLEPHRINE 40 MCG/ML (10ML) SYRINGE FOR IV PUSH (FOR BLOOD PRESSURE SUPPORT)
PREFILLED_SYRINGE | INTRAVENOUS | Status: AC
  Filled 2018-07-29: qty 20

## 2018-07-29 MED ORDER — SENNOSIDES-DOCUSATE SODIUM 8.6-50 MG PO TABS
1.0000 | ORAL_TABLET | Freq: Every day | ORAL | Status: DC
  Administered 2018-07-29 – 2018-07-31 (×3): 1 via ORAL
  Filled 2018-07-29 (×3): qty 1

## 2018-07-29 MED ORDER — HYDROMORPHONE HCL 1 MG/ML IJ SOLN: 0.2500 mg | INTRAMUSCULAR | Status: DC | PRN

## 2018-07-29 MED ORDER — BUPIVACAINE HCL (PF) 0.5 % IJ SOLN
INTRAMUSCULAR | Status: AC
  Filled 2018-07-29: qty 10

## 2018-07-29 MED ORDER — MIDAZOLAM HCL 2 MG/2ML IJ SOLN
INTRAMUSCULAR | Status: AC
  Filled 2018-07-29: qty 2

## 2018-07-29 MED ORDER — ONDANSETRON HCL 4 MG/2ML IJ SOLN
INTRAMUSCULAR | Status: AC
  Filled 2018-07-29: qty 2

## 2018-07-29 MED ORDER — FENTANYL CITRATE (PF) 250 MCG/5ML IJ SOLN
INTRAMUSCULAR | Status: AC
  Filled 2018-07-29: qty 5

## 2018-07-29 MED ORDER — FENTANYL CITRATE (PF) 250 MCG/5ML IJ SOLN
INTRAMUSCULAR | Status: AC
  Filled 2018-07-29: qty 10

## 2018-07-29 MED ORDER — DIPHENHYDRAMINE HCL 12.5 MG/5ML PO ELIX: 12.5000 mg | ORAL_SOLUTION | Freq: Four times a day (QID) | ORAL | Status: DC | PRN

## 2018-07-29 MED ORDER — METOCLOPRAMIDE HCL 5 MG/ML IJ SOLN
10.0000 mg | Freq: Four times a day (QID) | INTRAMUSCULAR | Status: AC
  Administered 2018-07-29 – 2018-07-30 (×3): 10 mg via INTRAVENOUS
  Filled 2018-07-29 (×4): qty 2

## 2018-07-29 MED ORDER — OXYCODONE HCL 5 MG PO TABS
5.0000 mg | ORAL_TABLET | ORAL | Status: DC | PRN
  Administered 2018-07-31: 10 mg via ORAL
  Filled 2018-07-29: qty 2

## 2018-07-29 MED ORDER — DEXTROSE IN LACTATED RINGERS 5 % IV SOLN
INTRAVENOUS | Status: DC
  Administered 2018-07-29: 13:00:00 via INTRAVENOUS

## 2018-07-29 MED ORDER — ATORVASTATIN CALCIUM 40 MG PO TABS
40.0000 mg | ORAL_TABLET | Freq: Every day | ORAL | Status: DC
  Administered 2018-07-30 – 2018-08-01 (×3): 40 mg via ORAL
  Filled 2018-07-29 (×3): qty 1

## 2018-07-29 MED ORDER — SUCCINYLCHOLINE CHLORIDE 200 MG/10ML IV SOSY
PREFILLED_SYRINGE | INTRAVENOUS | Status: DC | PRN
  Administered 2018-07-29: 100 mg via INTRAVENOUS

## 2018-07-29 MED ORDER — PHENYLEPHRINE HCL (PRESSORS) 10 MG/ML IV SOLN
INTRAVENOUS | Status: DC | PRN
  Administered 2018-07-29 (×5): 80 ug via INTRAVENOUS

## 2018-07-29 MED ORDER — ACETAMINOPHEN 500 MG PO TABS
1000.0000 mg | ORAL_TABLET | Freq: Once | ORAL | Status: AC
  Administered 2018-07-29: 1000 mg via ORAL

## 2018-07-29 MED ORDER — ACETAMINOPHEN 160 MG/5ML PO SOLN: 1000.0000 mg | Freq: Four times a day (QID) | ORAL | Status: DC

## 2018-07-29 MED ORDER — DEXAMETHASONE SODIUM PHOSPHATE 10 MG/ML IJ SOLN
INTRAMUSCULAR | Status: DC | PRN
  Administered 2018-07-29: 5 mg via INTRAVENOUS

## 2018-07-29 SURGICAL SUPPLY — 84 items
ADH SKN CLS APL DERMABOND .7 (GAUZE/BANDAGES/DRESSINGS) ×1
APL SRG 22X2 LUM MLBL SLNT (VASCULAR PRODUCTS) ×1
APPLICATOR TIP EXT COSEAL (VASCULAR PRODUCTS) ×2 IMPLANT
BIT DRILL 7/64X5 DISP (BIT) ×3 IMPLANT
BLADE SURG 11 STRL SS (BLADE) IMPLANT
CANISTER SUCT 3000ML PPV (MISCELLANEOUS) ×6 IMPLANT
CATH KIT ON Q 5IN SLV (PAIN MANAGEMENT) IMPLANT
CATH KIT ON-Q SILVERSOAK 5 (CATHETERS) IMPLANT
CATH KIT ON-Q SILVERSOAK 5IN (CATHETERS) ×3 IMPLANT
CATH THORACIC 28FR (CATHETERS) ×4 IMPLANT
CATH THORACIC 36FR (CATHETERS) IMPLANT
CATH THORACIC 36FR RT ANG (CATHETERS) IMPLANT
CLIP VESOCCLUDE MED 24/CT (CLIP) ×3 IMPLANT
CONN ST 1/4X3/8  BEN (MISCELLANEOUS) ×2
CONN ST 1/4X3/8 BEN (MISCELLANEOUS) IMPLANT
CONT SPEC 4OZ CLIKSEAL STRL BL (MISCELLANEOUS) ×8 IMPLANT
COVER SURGICAL LIGHT HANDLE (MISCELLANEOUS) ×2 IMPLANT
COVER WAND RF STERILE (DRAPES) ×3 IMPLANT
DERMABOND ADVANCED (GAUZE/BANDAGES/DRESSINGS) ×2
DERMABOND ADVANCED .7 DNX12 (GAUZE/BANDAGES/DRESSINGS) IMPLANT
DRAPE LAPAROSCOPIC ABDOMINAL (DRAPES) ×3 IMPLANT
DRAPE SLUSH/WARMER DISC (DRAPES) ×2 IMPLANT
DRAPE WARM FLUID 44X44 (DRAPES) ×1 IMPLANT
ELECT BLADE 6.5 EXT (BLADE) ×2 IMPLANT
ELECT REM PT RETURN 9FT ADLT (ELECTROSURGICAL) ×3
ELECTRODE REM PT RTRN 9FT ADLT (ELECTROSURGICAL) ×1 IMPLANT
GAUZE SPONGE 4X4 12PLY STRL (GAUZE/BANDAGES/DRESSINGS) ×3 IMPLANT
GAUZE SPONGE 4X4 12PLY STRL LF (GAUZE/BANDAGES/DRESSINGS) ×2 IMPLANT
GLOVE BIOGEL PI IND STRL 6 (GLOVE) IMPLANT
GLOVE BIOGEL PI INDICATOR 6 (GLOVE) ×8
GLOVE EUDERMIC 7 POWDERFREE (GLOVE) ×6 IMPLANT
GOWN STRL REUS W/ TWL LRG LVL3 (GOWN DISPOSABLE) ×2 IMPLANT
GOWN STRL REUS W/ TWL XL LVL3 (GOWN DISPOSABLE) ×1 IMPLANT
GOWN STRL REUS W/TWL LRG LVL3 (GOWN DISPOSABLE) ×9
GOWN STRL REUS W/TWL XL LVL3 (GOWN DISPOSABLE) ×3
HANDLE STAPLE ENDO GIA SHORT (STAPLE) ×2
KIT BASIN OR (CUSTOM PROCEDURE TRAY) ×3 IMPLANT
KIT TURNOVER KIT B (KITS) ×3 IMPLANT
NS IRRIG 1000ML POUR BTL (IV SOLUTION) ×8 IMPLANT
PACK CHEST (CUSTOM PROCEDURE TRAY) ×3 IMPLANT
PAD ARMBOARD 7.5X6 YLW CONV (MISCELLANEOUS) ×6 IMPLANT
RELOAD EGIA 45 MED/THCK PURPLE (STAPLE) ×8 IMPLANT
RELOAD EGIA TRIS TAN 45 CVD (STAPLE) ×18 IMPLANT
RELOAD STAPLE 30 GRN THCK TA (STAPLE) IMPLANT
RELOAD STAPLE 45 TAN MED CVD (STAPLE) IMPLANT
RELOAD STAPLE TA30 4.8 GRN (STAPLE) ×3 IMPLANT
SEALANT SURG COSEAL 4ML (VASCULAR PRODUCTS) IMPLANT
SEALANT SURG COSEAL 8ML (VASCULAR PRODUCTS) ×2 IMPLANT
SOLUTION ANTI FOG 6CC (MISCELLANEOUS) ×3 IMPLANT
SPECIMEN JAR MEDIUM (MISCELLANEOUS) ×3 IMPLANT
STAPLER ENDO GIA 12 SHRT THIN (STAPLE) IMPLANT
STAPLER ENDO GIA 12MM SHORT (STAPLE) ×1 IMPLANT
STAPLER TA30 4.8 NON-ABS (STAPLE) ×2 IMPLANT
SUT ETHILON 3 0 FSL (SUTURE) ×2 IMPLANT
SUT PROLENE 3 0 SH DA (SUTURE) IMPLANT
SUT PROLENE 4 0 RB 1 (SUTURE)
SUT PROLENE 4-0 RB1 .5 CRCL 36 (SUTURE) IMPLANT
SUT SILK  1 MH (SUTURE) ×4
SUT SILK 1 MH (SUTURE) ×2 IMPLANT
SUT SILK 1 TIES 10X30 (SUTURE) IMPLANT
SUT SILK 2 0SH CR/8 30 (SUTURE) IMPLANT
SUT SILK 3 0 SH CR/8 (SUTURE) IMPLANT
SUT VIC AB 1 CTX 36 (SUTURE) ×6
SUT VIC AB 1 CTX36XBRD ANBCTR (SUTURE) ×2 IMPLANT
SUT VIC AB 2 TP1 27 (SUTURE) ×5 IMPLANT
SUT VIC AB 2-0 CT1 27 (SUTURE)
SUT VIC AB 2-0 CT1 TAPERPNT 27 (SUTURE) IMPLANT
SUT VIC AB 2-0 CTX 36 (SUTURE) ×4 IMPLANT
SUT VIC AB 2-0 UR6 27 (SUTURE) IMPLANT
SUT VIC AB 3-0 MH 27 (SUTURE) IMPLANT
SUT VIC AB 3-0 SH 27 (SUTURE)
SUT VIC AB 3-0 SH 27X BRD (SUTURE) IMPLANT
SUT VIC AB 3-0 X1 27 (SUTURE) ×4 IMPLANT
SYSTEM SAHARA CHEST DRAIN ATS (WOUND CARE) ×3 IMPLANT
TAPE CLOTH SURG 4X10 WHT LF (GAUZE/BANDAGES/DRESSINGS) ×2 IMPLANT
TAPE UMBILICAL 1/8 X36 TWILL (MISCELLANEOUS) IMPLANT
TAPE UMBILICAL COTTON 1/8X30 (MISCELLANEOUS) ×1 IMPLANT
TIP APPLICATOR SPRAY EXTEND 16 (VASCULAR PRODUCTS) IMPLANT
TOWEL GREEN STERILE (TOWEL DISPOSABLE) ×3 IMPLANT
TOWEL GREEN STERILE FF (TOWEL DISPOSABLE) ×3 IMPLANT
TRAP SPECIMEN MUCOUS 40CC (MISCELLANEOUS) IMPLANT
TRAY FOLEY MTR SLVR 16FR STAT (SET/KITS/TRAYS/PACK) ×3 IMPLANT
TUNNELER SHEATH ON-Q 11GX8 DSP (PAIN MANAGEMENT) ×2 IMPLANT
WATER STERILE IRR 1000ML POUR (IV SOLUTION) ×6 IMPLANT

## 2018-07-29 NOTE — Anesthesia Procedure Notes (Signed)
Central Venous Catheter Insertion Performed by: Nolon Nations, MD, anesthesiologist Start/End6/29/2020 7:10 AM, 07/29/2018 7:20 AM Patient location: Pre-op. Preanesthetic checklist: patient identified, IV checked, site marked, risks and benefits discussed, surgical consent, monitors and equipment checked, pre-op evaluation, timeout performed and anesthesia consent Lidocaine 1% used for infiltration and patient sedated Hand hygiene performed  and maximum sterile barriers used  Catheter size: 8 Fr Total catheter length 16. Central line was placed.Double lumen Procedure performed using ultrasound guided technique. Ultrasound Notes:anatomy identified, needle tip was noted to be adjacent to the nerve/plexus identified, no ultrasound evidence of intravascular and/or intraneural injection and image(s) printed for medical record Attempts: 1 Following insertion, dressing applied, line sutured and Biopatch. Post procedure assessment: blood return through all ports, no air and free fluid flow  Patient tolerated the procedure well with no immediate complications.

## 2018-07-29 NOTE — Anesthesia Procedure Notes (Signed)
Arterial Line Insertion Start/End6/29/2020 8:00 AM, 07/29/2018 8:10 AM Performed by: Roderic Palau, MD  Patient location: Pre-op. Preanesthetic checklist: patient identified, IV checked, site marked, risks and benefits discussed, surgical consent, monitors and equipment checked, pre-op evaluation, timeout performed and anesthesia consent Lidocaine 1% used for infiltration Left, brachial was placed Catheter size: 20 Fr Hand hygiene performed  and maximum sterile barriers used   Attempts: 1 Procedure performed using ultrasound guided technique. Ultrasound Notes:anatomy identified, needle tip was noted to be adjacent to the nerve/plexus identified, no ultrasound evidence of intravascular and/or intraneural injection and image(s) printed for medical record Following insertion, dressing applied, Biopatch and line sutured. Post procedure assessment: normal and unchanged  Patient tolerated the procedure well with no immediate complications.

## 2018-07-29 NOTE — Anesthesia Procedure Notes (Signed)
Procedure Name: Intubation Date/Time: 07/29/2018 7:48 AM Performed by: Glynda Jaeger, CRNA Pre-anesthesia Checklist: Patient identified, Patient being monitored, Timeout performed, Emergency Drugs available and Suction available Patient Re-evaluated:Patient Re-evaluated prior to induction Oxygen Delivery Method: Circle System Utilized Preoxygenation: Pre-oxygenation with 100% oxygen Induction Type: IV induction Laryngoscope Size: Glidescope and 4 Grade View: Grade I Tube type: Oral Endobronchial tube: Double lumen EBT and 39 Fr Number of attempts: 1 Airway Equipment and Method: Video-laryngoscopy Placement Confirmation: ETT inserted through vocal cords under direct vision,  positive ETCO2 and breath sounds checked- equal and bilateral Tube secured with: Tape Dental Injury: Teeth and Oropharynx as per pre-operative assessment

## 2018-07-29 NOTE — Transfer of Care (Signed)
Immediate Anesthesia Transfer of Care Note  Patient: Kelly Olson.  Procedure(s) Performed: THORACOTOMY/RIGHT LOWER LOBECTOMY (Right Chest)  Patient Location: PACU  Anesthesia Type:General  Level of Consciousness: awake, patient cooperative and responds to stimulation  Airway & Oxygen Therapy: Patient Spontanous Breathing and Patient connected to face mask oxygen  Post-op Assessment: Report given to RN, Post -op Vital signs reviewed and stable and Patient moving all extremities X 4  Post vital signs: Reviewed and stable  Last Vitals:  Vitals Value Taken Time  BP 126/77 07/29/18 1110  Temp    Pulse 54 07/29/18 1111  Resp 12 07/29/18 1111  SpO2 100 % 07/29/18 1111  Vitals shown include unvalidated device data.  Last Pain:  Vitals:   07/29/18 0554  TempSrc:   PainSc: 0-No pain         Complications: No apparent anesthesia complications

## 2018-07-29 NOTE — Anesthesia Procedure Notes (Signed)
Arterial Line Insertion Start/End6/29/2020 6:55 AM, 07/29/2018 7:03 AM Performed by: Gaylene Brooks, CRNA, CRNA  Preanesthetic checklist: patient identified, IV checked, site marked, risks and benefits discussed, surgical consent, monitors and equipment checked, pre-op evaluation, timeout performed and anesthesia consent Left, radial was placed Catheter size: 20 G Hand hygiene performed  and maximum sterile barriers used  Allen's test indicative of satisfactory collateral circulation Attempts: 1 Procedure performed without using ultrasound guided technique. Following insertion, dressing applied and Biopatch. Post procedure assessment: normal  Patient tolerated the procedure well with no immediate complications.

## 2018-07-29 NOTE — Progress Notes (Signed)
EVENING ROUNDS NOTE :     [address]       [place] [address]             [phone]                 Day of Surgery Procedure(s) (LRB): THORACOTOMY/RIGHT LOWER LOBECTOMY (Right)  Total Length of Stay:  LOS: 0 days  BP (!) 154/77   Pulse 96   Temp 97.7 F (36.5 C) (Oral)   Resp 18   Ht 5\' 11"  (1.803 m)   Wt 72.3 kg   SpO2 98%   BMI 22.25 kg/m   .Intake/Output      06/28 0701 - 06/29 0700 06/29 0701 - 06/30 0700   I.V. (mL/kg)  1209.4 (16.7)   Other  80   Total Intake(mL/kg)  1289.4 (17.8)   Urine (mL/kg/hr)  575 (0.8)   Blood  40   Chest Tube  140   Total Output  755   Net  +534.4          . bupivacaine 0.5 % ON-Q pump SINGLE CATH 400 mL    .  ceFAZolin (ANCEF) IV 2 g (07/29/18 1635)  . dextrose 5% lactated ringers 75 mL/hr at 07/29/18 1600     Lab Results  Component Value Date   WBC 7.6 07/24/2018   HGB 15.9 07/24/2018   HCT 47.4 07/24/2018   PLT 253 07/24/2018   GLUCOSE 95 07/26/2018   CHOL 140 04/08/2018   TRIG 64 04/08/2018   HDL 62 04/08/2018   LDLCALC 65 04/08/2018   ALT 23 07/26/2018   AST 27 07/26/2018   NA 140 07/26/2018   K 3.6 07/26/2018   CL 104 07/26/2018   CREATININE 1.06 07/26/2018   BUN 20 07/26/2018   CO2 26 07/26/2018   INR 1.0 07/24/2018   HGBA1C 6.0 (H) 06/03/2014   Stable postop Good pain control No air leak   Grace Isaac MD  Beeper [phone] Office [phone] 07/29/2018 5:35 PM

## 2018-07-29 NOTE — Op Note (Signed)
CARDIOTHORACIC SURGERY OPERATIVE NOTE 07/29/2018 [place]. [id number]  Surgeon: Gaye Pollack, MD  First Assistant: Lars Pinks, PA-C    Preoperative Diagnosis: Right lower lobe lung mass  Postoperative Diagnosis: Same   Procedure:   1. Right  thoracotomy 2.Right lower lobectomy   Anesthesia: General Endotracheal   Clinical History/Surgical Indication:  This 68 year old gentleman has a new 1.6 cm irregular nodular subpleural mass in the medial aspect of the superior segment of the right lower lobe which is increased in size compared to his prior CT in November 2019. PET scan shows this lesion to be hypermetabolic with an SUV max of 6.4 which is suspicious for primary bronchogenic carcinoma. There are no other areas of hypermetabolism on the PET scan. This lesion is in an area that would be difficult to biopsy with a high likelihood of a false negative result. I also think it is in a difficult area to treat with radiation therapy. He is in overall good condition and I think is a suitable candidate for right thoracotomy and right lower lobectomy. He had a negative nuclear stress test by Dr. Ellyn Hack on 04/11/2018. I do not think this lesion will be amenable to wedge resection given its location. His pulmonary function test shows adequate pulmonary function to allow right lower lobectomy. I think his best long-term prognosis is going to be with surgical resection of this lesion. I reviewed the PET scan images with the patient and discussed treatment options with him and his wife by conference call. I discussed the surgical procedure of a right lower lobectomy including alternatives, benefits, and risks including but not limited to bleeding, blood transfusion, infection, prolonged air leak, pleural space complications, and the possibility that he may develop another cancer in the future. He understands and agrees to proceed.    Preparation:  The  patient was seen in the preoperative holding area and the correct patient, correct operation were confirmed with the patient after reviewing the medical record and xrays. The right side of the chest was signed by me. The consent was signed by me. Preoperative antibiotics were given. A radial arterial line was placed by the anesthesia team. The patient was taken back to the operating room and positioned supine on the operating room table. After being placed under general endotracheal anesthesia by the anesthesia team using a double lumen tube a foley catheter was placed. Lower extremity SCD's were placed. The radial arterial line tracing became erratic and therefore anesthesia decided to place a left brachial arterial line. A time out was taken and the correct patient, operation and operative side were confirmed with nursing and anesthesia staff. The patient was turned into the left lateral decubitus position with the right side up. The chest was prepped with betadine soap and solution and draped in the usual sterile manner. A surgical time-out was taken and the correct patient and operative procedure and operative side were confirmed with the nursing and anesthesia staff.    Right thoracotomy and right lower lobectomy:  The chest was entered through a lateral thoracotomy incision. The pleural space was entered through the 5th ICS. The pleural space was examined.  The upper and middle lobes were normal with no palpable masses. The mass was visible and palpable in the superior segment of the lower lobe posteriorly. There was umbilication of the surface of the lung over the mass. There was some pleural thickening adjacent to the lesion that looked unusual. I excised a sample for frozen section to be  sure that it wasn't tumor implants. 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 The fissures were both incomplete.  The major fissure was opened. The superior segmental  and basilar segmental arteries were exposed and encircled with tapes. They were divided using a vascular stapler. The inferior pulmonary ligament was divided up to the inferior pulmonary vein and the vein was encircled and divided using a stapler. The incomplete fissure between the upper and lower lobe posteriorly was divided using a stapler. The incomplete fissure anteriorly between the middle and lower lobes was divided using a stapler. The lower lobe bronchus was encircled with a tape. There were several lymph nodes taken with the specimen around the bronchus. A TA-30 stapler with 4.8 mm staples was passed around it and closed. The right lung was inflated and the upper and middle lobes fully expanded. The stapler was fired and the bronchial stump transected distal to the stapler. The chest was filled with saline and the bronchial stump tested at 30 cm pressure. There was no air leak. Hemostasis was complete.  Completion:  An On-Q pain catheter was placed through a stab incision in the chest wall and advanced along the intercostal space posteriorly. Two 28 F chest tubes were placed in the pleural space posteriorly and anteriorly. The ribs were reapproximated with # 2 vicryl pericostal sutures  The muscles were approximated using continuous #1 vicryl suture. The subcutaneous tissue was closed with 2-0 vicryl suture and the skin with 3-0 vicryl subcuticular suture. The sponge, needle, and instrument counts were correct according to the nurses. Dry sterile dressings were applied and the chest tubes were connected to pleurevac suction. The patient was turned into the supine position, extubated, and transferred to the PACU in satisfactory and stable condition.

## 2018-07-29 NOTE — Anesthesia Postprocedure Evaluation (Signed)
Anesthesia Post Note  Patient: Armond Cuthrell.  Procedure(s) Performed: THORACOTOMY/RIGHT LOWER LOBECTOMY (Right Chest)     Patient location during evaluation: PACU Anesthesia Type: General Level of consciousness: awake and alert Pain management: pain level controlled Vital Signs Assessment: post-procedure vital signs reviewed and stable Respiratory status: spontaneous breathing, nonlabored ventilation, respiratory function stable and patient connected to nasal cannula oxygen Cardiovascular status: blood pressure returned to baseline and stable Postop Assessment: no apparent nausea or vomiting Anesthetic complications: no    Last Vitals:  Vitals:   07/29/18 1210 07/29/18 1212  BP:  124/69  Pulse: (!) 57 (!) 56  Resp: 13 17  Temp: (!) 36.4 C   SpO2: 96% 97%    Last Pain:  Vitals:   07/29/18 1210  TempSrc:   PainSc: 0-No pain                 Ayeisha Lindenberger,W. EDMOND

## 2018-07-29 NOTE — Brief Op Note (Signed)
07/29/2018  10:43 AM  PATIENT:  Kelly [place].  68 y.o. male  PRE-OPERATIVE DIAGNOSIS:  RLL LUNG MASS  POST-OPERATIVE DIAGNOSIS:  RLL LUNG MASS  PROCEDURE:  Procedure(s): THORACOTOMY/RIGHT LOWER LOBECTOMY (Right)  SURGEON:  Surgeon(s) and Role:    * Bartle, Fernande Boyden, MD - Primary  PHYSICIAN ASSISTANT: Lars Pinks, PA-C   ANESTHESIA:   general  EBL:  40 mL   BLOOD ADMINISTERED:none  DRAINS: two 22F  Chest Tube(s) in the right  pleural space   LOCAL MEDICATIONS USED:  MARCAINE     SPECIMEN:  Source of Specimen:  right lower lobe and right pleural implant.  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  DISPOSITION OF SPECIMEN:  PATHOLOGY  COUNTS:  YES  TOURNIQUET:  * No tourniquets in log *  DICTATION: .Note written in EPIC  PLAN OF CARE: Admit to inpatient   PATIENT DISPOSITION:  PACU - hemodynamically stable.   Delay start of Pharmacological VTE agent (>24hrs) due to surgical blood loss or risk of bleeding: yes

## 2018-07-29 NOTE — Interval H&P Note (Signed)
History and Physical Interval Note:  07/29/2018 7:15 AM  Kelly [place].  has presented today for surgery, with the diagnosis of RLL LUNG MASS.  The various methods of treatment have been discussed with the patient and family. After consideration of risks, benefits and other options for treatment, the patient has consented to  Procedure(s): THORACOTOMY/RIGHT LOWER LOBECTOMY (Right) as a surgical intervention.  The patient's history has been reviewed, patient examined, no change in status, stable for surgery.  I have reviewed the patient's chart and labs.  Questions were answered to the patient's satisfaction.     Gaye Pollack

## 2018-07-30 ENCOUNTER — Encounter (HOSPITAL_COMMUNITY): Payer: Self-pay | Admitting: Surgery

## 2018-07-30 ENCOUNTER — Inpatient Hospital Stay (HOSPITAL_COMMUNITY): Payer: Medicare Other

## 2018-07-30 LAB — TYPE AND SCREEN
ABO/RH(D): B POS
Antibody Screen: NEGATIVE
Unit division: 0
Unit division: 0

## 2018-07-30 LAB — CBC
HCT: 43.4 % (ref 39.0–52.0)
Hemoglobin: 14.3 g/dL (ref 13.0–17.0)
MCH: 31.6 pg (ref 26.0–34.0)
MCHC: 32.9 g/dL (ref 30.0–36.0)
MCV: 96 fL (ref 80.0–100.0)
Platelets: 261 10*3/uL (ref 150–400)
RBC: 4.52 MIL/uL (ref 4.22–5.81)
RDW: 12.3 % (ref 11.5–15.5)
WBC: 15 10*3/uL — ABNORMAL HIGH (ref 4.0–10.5)
nRBC: 0 % (ref 0.0–0.2)

## 2018-07-30 LAB — GLUCOSE, CAPILLARY
Glucose-Capillary: 127 mg/dL — ABNORMAL HIGH (ref 70–99)
Glucose-Capillary: 99 mg/dL (ref 70–99)
Glucose-Capillary: 147 mg/dL — ABNORMAL HIGH (ref 70–99)

## 2018-07-30 LAB — BASIC METABOLIC PANEL
Anion gap: 8 (ref 5–15)
BUN: 17 mg/dL (ref 8–23)
CO2: 27 mmol/L (ref 22–32)
Calcium: 8.7 mg/dL — ABNORMAL LOW (ref 8.9–10.3)
Chloride: 103 mmol/L (ref 98–111)
Creatinine, Ser: 0.78 mg/dL (ref 0.61–1.24)
GFR calc Af Amer: >60 mL/min (ref >60)
GFR calc non Af Amer: >60 mL/min (ref >60)
Glucose, Bld: 143 mg/dL — ABNORMAL HIGH (ref 70–99)
Potassium: 4.5 mmol/L (ref 3.5–5.1)
Sodium: 138 mmol/L (ref 135–145)

## 2018-07-30 LAB — BPAM RBC
Blood Product Expiration Date: 202007102359
Blood Product Expiration Date: 202007192359
ISSUE DATE / TIME: 202006290643
ISSUE DATE / TIME: 202006290643
Unit Type and Rh: 7300
Unit Type and Rh: 7300

## 2018-07-30 MED ORDER — CARVEDILOL 3.125 MG PO TABS
3.1250 mg | ORAL_TABLET | Freq: Two times a day (BID) | ORAL | Status: DC
  Administered 2018-07-30 – 2018-07-31 (×3): 3.125 mg via ORAL
  Filled 2018-07-30 (×3): qty 1

## 2018-07-30 MED ORDER — ALBUTEROL SULFATE (2.5 MG/3ML) 0.083% IN NEBU: 2.5000 mg | INHALATION_SOLUTION | RESPIRATORY_TRACT | Status: DC | PRN

## 2018-07-30 MED ORDER — HYDRALAZINE HCL 20 MG/ML IJ SOLN
10.0000 mg | INTRAMUSCULAR | Status: DC | PRN
  Administered 2018-07-30 – 2018-07-31 (×2): 10 mg via INTRAVENOUS
  Filled 2018-07-30 (×2): qty 1

## 2018-07-30 MED ORDER — CHLORHEXIDINE GLUCONATE CLOTH 2 % EX PADS
6.0000 | MEDICATED_PAD | Freq: Every day | CUTANEOUS | Status: DC
  Administered 2018-07-30 – 2018-07-31 (×2): 6 via TOPICAL

## 2018-07-30 NOTE — Plan of Care (Signed)

## 2018-07-30 NOTE — Progress Notes (Addendum)
[facility] DAILY ICU PROGRESS NOTE                   [address]            [place] [address]          [phone]   1 Day Post-Op Procedure(s) (LRB): THORACOTOMY/RIGHT LOWER LOBECTOMY (Right)  Total Length of Stay:  LOS: 1 day   Subjective: Patient sitting in chair. He states pain is fairly well controlled and he is not using PCA much.  Objective: Vital signs in last 24 hours: Temp:  [97.5 F (36.4 C)-98 F (36.7 C)] 97.8 F (36.6 C) (06/30 0353) Pulse Rate:  [45-96] 54 (06/30 0600) Cardiac Rhythm: Normal sinus rhythm;Sinus bradycardia (06/30 0000) Resp:  [0-24] 16 (06/30 0600) BP: (95-154)/(33-80) 134/63 (06/30 0600) SpO2:  [93 %-100 %] 100 % (06/30 0600) Arterial Line BP: (132-137)/(55-59) 136/59 (06/29 1200)  Filed Weights   07/29/18 0554  Weight: 72.3 kg      Intake/Output from previous day: 06/29 0701 - 06/30 0700 In: 2162.2 [I.V.:1882.1; IV Piggyback:200.1] Out: 1885 [Urine:1525; Blood:40; Chest Tube:320]  Intake/Output this shift: No intake/output data recorded.  Current Meds: Scheduled Meds: . acetaminophen  1,000 mg Oral Q6H   Or  . acetaminophen (TYLENOL) oral liquid 160 mg/5 mL  1,000 mg Oral Q6H  . aspirin EC  81 mg Oral Daily  . atorvastatin  40 mg Oral Daily  . bisacodyl  10 mg Oral Daily  . carvedilol  6.25 mg Oral BID WC  . Chlorhexidine Gluconate Cloth  6 each Topical Daily  . enoxaparin (LOVENOX) injection  40 mg Subcutaneous Daily  . insulin aspart  0-24 Units Subcutaneous Q4H  . metoCLOPramide (REGLAN) injection  10 mg Intravenous Q6H  . morphine   Intravenous Q4H  . senna-docusate  1 tablet Oral QHS   Continuous Infusions: . bupivacaine 0.5 % ON-Q pump SINGLE CATH 400 mL    . dextrose 5% lactated ringers 75 mL/hr at 07/30/18 0200   PRN Meds:.albuterol, naloxone **AND** sodium chloride flush, ondansetron (ZOFRAN) IV, oxyCODONE, traMADol  General appearance: alert, cooperative and no distress Neurologic: intact Heart:  Slightly bradycardic Lungs: Clear to auscultation on left and slightly diminished right base Abdomen: Soft, non tender, bowel sounds present Extremities: No LE edema Wound: Dressing is clean and dry Chest tubes: are to suction and there is no air leak  Lab Results: CBC: Recent Labs    07/30/18 0237  WBC 15.0*  HGB 14.3  HCT 43.4  PLT 261   BMET:  Recent Labs    07/30/18 0237  NA 138  K 4.5  CL 103  CO2 27  GLUCOSE 143*  BUN 17  CREATININE 0.78  CALCIUM 8.7*    CMET: Lab Results  Component Value Date   WBC 15.0 (H) 07/30/2018   HGB 14.3 07/30/2018   HCT 43.4 07/30/2018   PLT 261 07/30/2018   GLUCOSE 143 (H) 07/30/2018   CHOL 140 04/08/2018   TRIG 64 04/08/2018   HDL 62 04/08/2018   LDLCALC 65 04/08/2018   ALT 23 07/26/2018   AST 27 07/26/2018   NA 138 07/30/2018   K 4.5 07/30/2018   CL 103 07/30/2018   CREATININE 0.78 07/30/2018   BUN 17 07/30/2018   CO2 27 07/30/2018   INR 1.0 07/24/2018   HGBA1C 6.0 (H) 06/03/2014      PT/INR: No results for input(s): LABPROT, INR in the last 72 hours. Radiology: Dg Chest Port 1  View  Result Date: 07/30/2018 CLINICAL DATA:  Right lobectomy. EXAM: PORTABLE CHEST 1 VIEW COMPARISON:  07/29/2018.  08/01/2016.  PET-CT 07/16/2018 FINDINGS: Postsurgical changes right lung. Right IJ line and 2 right chest tubes in stable position. Mild right base atelectasis. Chronic interstitial prominence. No pneumothorax. Prior CABG. Heart size stable. Mild right chest wall subcutaneous emphysema again noted. IMPRESSION: 1. Postsurgical changes right lung. Right IJ line and 2 right chest tubes in stable position. No pneumothorax. Mild right chest wall subcutaneous emphysema again noted. 2. Mild right base atelectasis noted on today's exam. Chest is otherwise stable. Electronically Signed   By: Marcello Moores  Register   On: 07/30/2018 06:45   Dg Chest Port 1 View  Result Date: 07/29/2018 CLINICAL DATA:  Right lobectomy EXAM: PORTABLE CHEST 1 VIEW  COMPARISON:  07/26/2018 FINDINGS: There is interval right partial lobectomy. Two right-sided chest tubes are present in satisfactory position. No right pneumothorax. The lungs are hyperinflated likely secondary to COPD. There is no pleural effusion. The heart and mediastinal contours are unremarkable. There is evidence of prior CABG. There is a right jugular central venous catheter with the tip projecting over the SVC. There is no acute osseous abnormality. IMPRESSION: Interval right upper lobectomy. Two right-sided chest tubes in satisfactory position without a pneumothorax. Electronically Signed   By: Kathreen Devoid   On: 07/29/2018 12:18     Assessment/Plan: S/P Procedure(s) (LRB): THORACOTOMY/RIGHT LOWER LOBECTOMY (Right) 1. CV-SB in the 50's. On Coreg 6.25 mg bid (as taken prior to admission) so will decerase with parameters. Will not restart Losartan yet. 2. Pulmonary-On 2 liters via [place]. Chest tubes with 320 cc since surgery. Chest tubes are to suction. There is no air leak. CXR this am shows no pneumothorax, mild right subcutaneous emphysema, and right base atelectasis. Hope to remove one chest tube soon. Encourage incentive spirometer. Await final pathology 3. At patient request, will remove foley in am 4. CBGs 206/169/127. No prior history of diabetes so will stop accu checks and SS PRN.    Donielle Liston Alba PA-C 07/30/2018 7:05 AM    Chart reviewed, patient examined, agree with above. CXR looks good. No air leak from chest tubes so will put to water seal. Plan to remove anterior tube tomorrow if chest xray ok.

## 2018-07-30 NOTE — Progress Notes (Signed)
Patient ID: Kelly [place]., male   DOB: 06/22/1950, 68 y.o.   MRN: [id number] [facility] Evening Rounds:  Hemodynamically stable Pain under control  Chest tube output low, no air leak.  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 T1b, stage 1. Discussed with patient.

## 2018-07-31 ENCOUNTER — Inpatient Hospital Stay (HOSPITAL_COMMUNITY): Payer: Medicare Other

## 2018-07-31 ENCOUNTER — Encounter (HOSPITAL_COMMUNITY): Payer: Self-pay | Admitting: General Practice

## 2018-07-31 MED ORDER — METOCLOPRAMIDE HCL 5 MG/ML IJ SOLN
10.0000 mg | Freq: Four times a day (QID) | INTRAMUSCULAR | Status: AC
  Administered 2018-07-31 – 2018-08-01 (×4): 10 mg via INTRAVENOUS
  Filled 2018-07-31 (×4): qty 2

## 2018-07-31 MED ORDER — KETOROLAC TROMETHAMINE 15 MG/ML IJ SOLN: 15.0000 mg | Freq: Four times a day (QID) | INTRAMUSCULAR | Status: DC | PRN

## 2018-07-31 MED ORDER — GUAIFENESIN ER 600 MG PO TB12
600.0000 mg | ORAL_TABLET | Freq: Two times a day (BID) | ORAL | Status: DC
  Administered 2018-07-31 – 2018-08-01 (×4): 600 mg via ORAL
  Filled 2018-07-31 (×4): qty 1

## 2018-07-31 MED ORDER — LOSARTAN POTASSIUM 25 MG PO TABS
25.0000 mg | ORAL_TABLET | Freq: Every day | ORAL | Status: DC
  Administered 2018-07-31: 11:00:00 25 mg via ORAL
  Filled 2018-07-31: qty 1

## 2018-07-31 MED ORDER — CARVEDILOL 6.25 MG PO TABS
6.2500 mg | ORAL_TABLET | Freq: Two times a day (BID) | ORAL | Status: DC
  Administered 2018-07-31 – 2018-08-02 (×5): 6.25 mg via ORAL
  Filled 2018-07-31 (×5): qty 1

## 2018-07-31 MED ORDER — CARVEDILOL 3.125 MG PO TABS
3.1250 mg | ORAL_TABLET | Freq: Once | ORAL | Status: AC
  Administered 2018-07-31: 3.125 mg via ORAL
  Filled 2018-07-31: qty 1

## 2018-07-31 NOTE — Plan of Care (Signed)
  Problem: Education: Goal: Knowledge of General Education information will improve Description Including pain rating scale, medication(s)/side effects and non-pharmacologic comfort measures Outcome: Progressing   Problem: Health Behavior/Discharge Planning: Goal: Ability to manage health-related needs will improve Outcome: Progressing   

## 2018-07-31 NOTE — Discharge Summary (Signed)
Physician Discharge Summary       [address]       [place] [address]             [phone]    Patient ID: Kelly Olson. MRN: [id number] DOB/AGE: [birth year] 68 y.o.  Admit date: 07/29/2018 Discharge date: 08/02/2018  Admission Diagnoses: Right lower lobe lung mass  Discharge Diagnoses:  1. S/p right lower lobectomy 2.  2. History of coronary artery disease-s/p CABG x 4 3. History of tobacco abuse 4. History of hyperlipidemia  Procedure (s):  1. Right  thoracotomy 2.Right lower lobectomy by Dr. Cyndia Bent on 07/29/2018.  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 A routine surveillance chest CT showed an irregular nodular subpleural soft tissue density in the medial aspect of the superior segment of the right lower lobe adjacent to the spine which is increased in size from his prior CT scan on 12/12/2017. This was barely visible at that time. PET scan shows this lesion to be hypermetabolic with an SUV max of 6.4 which is suspicious for primary bronchogenic carcinoma. There are no other areas of hypermetabolism on the PET scan.  This 10 year old gentleman has a new 1.6 cm irregular nodular subpleural mass in the medial aspect of the superior segment of the right lower lobe which is increased in size compared to his prior CT in November 2019. PET scan shows this lesion to be hypermetabolic with an SUV max of 6.4 which is suspicious for primary bronchogenic carcinoma. There are no other areas of hypermetabolism on the PET scan. This lesion is in an area that would be difficult to biopsy with a high likelihood of a false negative result. I also think it is in a difficult area to treat with radiation therapy. He is  in overall good condition and I think is a suitable candidate for right thoracotomy and right lower lobectomy. He had a negative nuclear stress test by Dr. Ellyn Hack on 04/11/2018. I do not think this lesion will be amenable to wedge resection given its location. His pulmonary function test shows adequate pulmonary function to allow right lower lobectomy. I think his best long-term prognosis is going to be with surgical resection of this lesion. I reviewed the PET scan images with the patient and discussed treatment options with him and his wife by conference call. I discussed the surgical procedure of a right lower lobectomy including alternatives, benefits, and risks. He understands and agrees to proceed. He underwent a right thoracotomy and right lower lobectomy on 07/29/2018.  Pathology: 1. Pleura, biopsy, Right - PLEURA WITH CHRONIC INFLAMMATION. - NO MALIGNANCY IDENTIFIED. 2. Lung, resection (segmental or lobe), Right lower lobe - SQUAMOUS CELL CARCINOMA, 1.7 CM. - TUMOR FOCALLY INVOLVES VISCERAL PLEURA. - MARGINS NOT INVOLVED. - SIX LYMPH NODES WITH NO METASTATIC CARCINOMA. TNM CODE: pT2a, pN0.  Brief Hospital Course:  The patient remained afebrile and hemodynamically stable. A line and foley were removed early in the post operative course. Chest tube output gradually decreased. Daily chest x rays were obtained and remained stable. First chest tube was removed on 07/01 and last chest tube was removed on 07/02.  Patient did have intermittent nausea post op. Narcotics and Toradol were stopped. He was given Zofran PRN and Reglan. His appetite was "  so so" because of the nausea so he was given Ensure. Nausea did resolve. Patient appeared anxious and he was hypertensive, despite being on both Coreg and Losartan as taken pre operatively. His Losartan was increased for better blood pressure control. HE was instructed to make a follow up appointment with Dr. Ellyn Hack regarding further blood pressure  management after discharge. Patient is ambulating on room air. Patient is tolerating a diet and has had a bowel movement. Wounds are clean and dry. Final chest X ray showed did show small right pleural effusion. As discussed with Dr. Cyndia Bent, will give Lasix 40 mg daily for a few days post op. Patient will not be given potassium supplement as potassium on 06/30 was 4.5. Patient is felt surgically stable for discharge today.   Latest Vital Signs: Blood pressure 129/80, pulse 74, temperature 98.2 F (36.8 C), temperature source Oral, resp. rate 16, height 5\' 11"  (1.803 m), weight 73.3 kg, SpO2 95 %.  Physical Exam: General appearance: alert and cooperative Heart: regular rate and rhythm, S1, S2 normal, no murmur, click, rub or gallop Lungs: clear to auscultation bilaterally Wound: incision ok  Discharge Condition:Stable and discharged to home.  Recent laboratory studies:  Lab Results  Component Value Date   WBC 15.0 (H) 07/30/2018   HGB 14.3 07/30/2018   HCT 43.4 07/30/2018   MCV 96.0 07/30/2018   PLT 261 07/30/2018   Lab Results  Component Value Date   NA 138 07/30/2018   K 4.5 07/30/2018   CL 103 07/30/2018   CO2 27 07/30/2018   CREATININE 0.78 07/30/2018   GLUCOSE 143 (H) 07/30/2018    Diagnostic Studies:   Ct Chest Wo Contrast  Result Date: 07/10/2018 CLINICAL DATA:  Lung nodules, history of right lung cancer. EXAM: CT CHEST WITHOUT CONTRAST TECHNIQUE: Multidetector CT imaging of the chest was performed following the standard protocol without IV contrast. COMPARISON:  12/12/2017 cough 06/20/2017 and 11/15/2016. FINDINGS: Cardiovascular: Atherosclerotic calcification of the aorta. Pulmonary arteries are enlarged. Heart size normal. No pericardial effusion. Mediastinum/Nodes: No pathologically enlarged mediastinal or axillary lymph nodes. Hilar regions are difficult to definitively evaluate without IV contrast. Esophagus is grossly unremarkable. Lungs/Pleura: Centrilobular  emphysema. Mild biapical pleuroparenchymal scarring. Irregular subpleural nodule in the medial aspect of the superior segment right lower lobe has enlarged, now measuring 0.9 x 1.6 cm (series 8, image 87), previously 0.4 x 0.9 cm on 12/12/2017. Postoperative scarring in the right lower lobe. Additional parenchymal and subpleural nodular densities measure up to 10 mm in the peripheral left lower lobe (122), stable from 12/12/2017. No pleural fluid. Airway is unremarkable. Upper Abdomen: Subcentimeter low-attenuation lesion in the periphery of the right hepatic lobe is unchanged from 11/15/2016, indicative of a benign lesion. Visualized portions of the liver, gallbladder, adrenal glands, kidneys, spleen, pancreas, stomach and bowel are otherwise grossly unremarkable. No upper abdominal adenopathy. Musculoskeletal: No worrisome lytic or sclerotic lesions. IMPRESSION: 1. Irregular nodular subpleural soft tissue in the medial aspect of the superior segment right lower lobe, increased in size from 12/12/2017 and worrisome for primary bronchogenic carcinoma. Lesion appears more solid than would be expected with paraspinal scarring. 2. Additional bilateral pulmonary nodules are stable. 3.  Aortic atherosclerosis (ICD10-170.0). 4. Enlarged pulmonary arteries, indicative of pulmonary arterial hypertension. 5.  Emphysema (ICD10-J43.9). Electronically Signed   By: Lorin Picket M.D.   On: 07/10/2018 09:14   Nm Pet Image Restag (ps) Skull Base To Thigh  Result Date: 07/17/2018 CLINICAL DATA:  Subsequent treatment strategy for restaging of lung  nodule. History of the vats and wedge resection on the right on 07/10/2016. EXAM: NUCLEAR MEDICINE PET SKULL BASE TO THIGH TECHNIQUE: 8.0 mCi F-18 FDG was injected intravenously. Full-ring PET imaging was performed from the skull base to thigh after the radiotracer. CT data was obtained and used for attenuation correction and anatomic localization. Fasting blood glucose: 115 mg/dl  COMPARISON:  Chest CT of 07/10/2018.  PET of 07/04/2016. FINDINGS: Mediastinal blood pool activity: SUV max 2.2 Liver activity: SUV max NA NECK: No areas of abnormal hypermetabolism. Incidental CT findings: No cervical adenopathy. Bilateral carotid atherosclerosis. CHEST: Hypermetabolism corresponding to the area of pleural-based nodularity within the medial portion of the superior segment right lower lobe. Example of 1.4 cm and a S.U.V. max of 6.4 on image 40/8. No thoracic nodal hypermetabolism. Incidental CT findings: Deferred to recent diagnostic CT. Centrilobular emphysema. Right lower lobe wedge resection. Aortic atherosclerosis. ABDOMEN/PELVIS: No abdominopelvic parenchymal or nodal hypermetabolism. Incidental CT findings: Normal adrenal glands. Abdominal aortic atherosclerosis. Extensive colonic diverticulosis. SKELETON: No abnormal marrow activity. Incidental CT findings: none IMPRESSION: Hypermetabolism corresponding to the pleural-based nodularity within the medial portion of the superior segment right lower lobe. 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 Presuming non-small-cell histology, T1bN0M0 or stage IA. Electronically Signed   By: Abigail Miyamoto M.D.   On: 07/17/2018 11:37   Dg Chest Port 1 View  Result Date: 08/01/2018 CLINICAL DATA:  Follow-up pneumothorax and chest tube. EXAM: PORTABLE CHEST 1 VIEW COMPARISON:  07/31/2018 FINDINGS: One of the 2 right chest tubes has been removed. Small amount of pleural air now visible on the right. Less than 10% by volume. Mild basilar atelectasis. No other change. IMPRESSION: One of the 2 right chest tubes has been removed. Small amount of pleural air on the right, approximately 10% Electronically Signed   By: Nelson Chimes M.D.   On: 08/01/2018 08:30   Dg Chest Port 1 View  Result Date: 07/31/2018 CLINICAL DATA:  Right pneumothorax. EXAM: PORTABLE CHEST 1 VIEW COMPARISON:  Radiograph of July 30, 2018. FINDINGS: The heart size and  mediastinal contours are within normal limits. Sternotomy wires are noted. Left lung is clear. Stable position of 2 right-sided chest tubes are noted without pneumothorax. Small amount of subcutaneous emphysema is seen over right lateral chest wall. Mildly displaced right seventh rib fracture is noted. IMPRESSION: Stable position of 2 right-sided chest tubes are noted without definite pneumothorax. Mildly displaced right seventh rib fracture is noted. Mild subcutaneous emphysema is seen over right lateral chest wall. Electronically Signed   By: Marijo Conception M.D.   On: 07/31/2018 08:57    Discharge Medications: Allergies as of 08/02/2018   No Known Allergies     Medication List    TAKE these medications   aspirin 81 MG tablet Take 81 mg by mouth daily.   atorvastatin 40 MG tablet Commonly known as: LIPITOR Take 40 mg by mouth daily.   carvedilol 3.125 MG tablet Commonly known as: COREG Take 2 tablets (6.25 mg total) by mouth 2 (two) times daily with a meal.   furosemide 40 MG tablet Commonly known as: Lasix Take 1 tablet (40 mg total) by mouth daily.   losartan 50 MG tablet Commonly known as: COZAAR Take 1 tablet (50 mg total) by mouth daily. What changed:   medication strength  how much to take   sildenafil 50 MG tablet Commonly known as: Viagra Take 1 tablet (50 mg total) by mouth daily as needed for erectile dysfunction.  Follow Up Appointments: Follow-up Information    Gaye Pollack, MD. Go on 08/21/2018.   Specialty: Cardiothoracic Surgery Why: PA/LAT CXR to be taken (at [facility] which is in the same building as Dr. Vivi Martens office) on 07/22 at 1:30 pm;Appointment time is at 2:00 pm Contact information: [place] [address] [phone]        Nurse. Go on 08/09/2018.   Why: Appointment is with nurse only to have chest tube sutures removed. Appointment time is at 11:30 am Contact information: [address] [place] [address]       Leonie Man, MD. Call.   Specialty: Cardiology Why: for a follow up appointment regarding further management of blood pressure (Losartan was increased from 25 to 50 mg) Contact information: [place] [address] [phone]           Signed: Sharalyn [facility] 08/02/2018, 8:06 AM

## 2018-07-31 NOTE — Discharge Instructions (Signed)
Thoracotomy, Care After This sheet gives you information about how to care for yourself after your procedure. Your doctor may also give you more specific instructions. If you have problems or questions, contact your doctor. Follow these instructions at home: Preventing lung infection (pneumonia)  Take deep breaths or do breathing exercises as told by your doctor.  Cough often. Coughing is important to clear thick spit (phlegm) and open your lungs. If coughing hurts, hold a pillow against your chest or place both hands flat on top of your cut (splinting) when you cough. This may help with discomfort.  Use an incentive spirometer as told. This is a tool that measures how well you fill your lungs with each breath.  Do lung therapy (pulmonary rehabilitation) as told. Medicines  Take over-the-counter or prescription medicines only as told by your doctor.  If you have pain, take pain-relieving medicine before your pain gets very bad. This will help you breathe and cough more comfortably.  If you were prescribed an antibiotic medicine, take it as told by your doctor. Do not stop taking the antibiotic even if you start to feel better. Activity   Ask your doctor what activities are safe for you.  Do not travel by airplane for 2 weeks after your chest tube is removed, or until your doctor says that this is safe.  Do not lift anything that is heavier than 10 lb (4.5 kg), or the limit that your doctor tells you, until he or she says that it is safe.  Do not drive until your doctor approves. ? Do not drive or use heavy machinery while taking prescription pain medicine. Incision care   Follow instructions from your doctor about how to take care of your cut from surgery (incision). Make sure you: ? Wash your hands with soap and water before you change your bandage (dressing). If you cannot use soap and water, use hand sanitizer. ? Change your bandage as told by your doctor. ? Leave stitches  (sutures), skin glue, or skin tape (adhesive) strips in place. They may need to stay in place for 2 weeks or longer. If tape strips get loose and curl up, you may trim the loose edges. Do not remove tape strips completely unless your doctor says it is okay.  Keep your bandage dry.  Check your cut from surgery every day for signs of infection. Check for: ? More redness, swelling, or pain. ? More fluid or blood. ? Warmth. ? Pus or a bad smell. Bathing  Do not take baths, swim, or use a hot tub until your doctor approves. You may take showers.  After your bandage has been removed, use soap and water to gently wash your cut from surgery. Do not use anything else to clean your cut unless your doctor tells you to. Eating and drinking  Eat a healthy diet as told by your doctor. A healthy diet includes: ? Fresh fruits and vegetables. ? Whole grains. ? Low-fat (lean) proteins.  Drink enough fluid to keep your pee (urine) clear or pale yellow. General instructions  To prevent or treat trouble pooping (constipation) while you are taking prescription pain medicine, your doctor may recommend that you: ? Take over-the-counter or prescription medicines. ? Eat foods that are high in fiber. These include fresh fruits and vegetables, whole grains, and beans. ? Limit foods that are high in fat and processed sugars, such as fried and sweet foods.  Do not use any products that contain nicotine or tobacco. These include  cigarettes and e-cigarettes. If you need help quitting, ask your doctor.  Avoid secondhand smoke.  Wear compression stockings as told. These help to prevent blood clots and reduce swelling in your legs.  If you have a chest tube, care for it as told.  Keep all follow-up visits as told by your doctor. This is important. Contact a doctor if:  You have more redness, swelling, or pain around your cut from surgery.  You have more fluid or blood coming from your cut from  surgery.  Your cut from surgery feels warm to the touch.  You have pus or a bad smell coming from your cut from surgery.  You have a fever or chills.  Your heartbeat seems uneven.  You feel sick to your stomach (nauseous).  You throw up (vomit).  You have muscle aches.  You have trouble pooping (having a bowel movement). This may mean that you: ? Poop fewer times in a week than normal. ? Have a hard time pooping. ? Have poop that is dry, hard, or bigger than normal. Get help right away if:  You get a rash.  You feel light-headed.  You feel like you might pass out (faint).  You are short of breath.  You have trouble breathing.  You are confused.  You have trouble talking.  You have problems with your seeing (vision).  You are not able to move.  You lose feeling (have numbness) in your: ? Face. ? Arms. ? Legs.  You pass out.  You have a sudden, bad headache.  You feel weak.  You have chest pain.  You have pain that: ? Is very bad. ? Gets worse, even with medicine. Summary  Take deep breaths, do breathing exercises, and cough often. This helps prevent lung infection (pneumonia).  Do not drive until your doctor approves. Do not travel by airplane for 2 weeks after your chest tube is removed, or until your doctor says that this is safe.  Check your cut from surgery every day for signs of infection.  Eat a healthy diet. This includes fresh fruits and vegetables, whole grains, and low-fat (lean) proteins. This information is not intended to replace advice given to you by your health care provider. Make sure you discuss any questions you have with your health care provider. Document Released: 07/18/2011 Document Revised: 12/29/2016 Document Reviewed: 10/11/2015 Elsevier Patient Education  2020 [employer].

## 2018-07-31 NOTE — Progress Notes (Addendum)
TCTS DAILY ICU PROGRESS NOTE                   [address]            [place], [address]          [phone]   2 Days Post-Op Procedure(s) (LRB): THORACOTOMY/RIGHT LOWER LOBECTOMY (Right)  Total Length of Stay:  LOS: 2 days   Subjective: Patient sitting in chair. He feels like he has upper airway congestion. He is passing flatus. He states he had a rough night because of hypertension.  Objective: Vital signs in last 24 hours: Temp:  [97.6 F (36.4 C)-98.6 F (37 C)] 97.6 F (36.4 C) (07/01 0359) Pulse Rate:  [49-77] 71 (07/01 0000) Cardiac Rhythm: Normal sinus rhythm (07/01 0100) Resp:  [11-29] 11 (07/01 0441) BP: (90-176)/(55-108) 160/84 (07/01 0252) SpO2:  [95 %-100 %] 96 % (07/01 0441) Weight:  [71.2 kg] 71.2 kg (07/01 0600)  Filed Weights   07/29/18 0554 07/30/18 0700 07/31/18 0600  Weight: 72.3 kg 72.5 kg 71.2 kg      Intake/Output from previous day: 06/30 0701 - 07/01 0700 In: 351.6 [P.O.:120; I.V.:231.6] Out: 3329 [Urine:1325; Chest Tube:230]  Intake/Output this shift: No intake/output data recorded.  Current Meds: Scheduled Meds: . acetaminophen  1,000 mg Oral Q6H   Or  . acetaminophen (TYLENOL) oral liquid 160 mg/5 mL  1,000 mg Oral Q6H  . aspirin EC  81 mg Oral Daily  . atorvastatin  40 mg Oral Daily  . bisacodyl  10 mg Oral Daily  . carvedilol  3.125 mg Oral BID WC  . Chlorhexidine Gluconate Cloth  6 each Topical Daily  . enoxaparin (LOVENOX) injection  40 mg Subcutaneous Daily  . morphine   Intravenous Q4H  . senna-docusate  1 tablet Oral QHS   Continuous Infusions: . bupivacaine 0.5 % ON-Q pump SINGLE CATH 400 mL    . dextrose 5% lactated ringers 10 mL/hr at 07/31/18 0000   PRN Meds:.albuterol, hydrALAZINE, naloxone **AND** sodium chloride flush, ondansetron (ZOFRAN) IV, oxyCODONE, traMADol  General appearance: alert, cooperative and no distress Neurologic: intact Heart: RRR Lungs: Clear to auscultation on left and  slightly diminished right base Abdomen: Soft, non tender, bowel sounds present Extremities: No LE edema Wound: Clean and dry Chest tubes: are to water seal and there is tidling with cough but no true air leak  Lab Results: CBC: Recent Labs    07/30/18 0237  WBC 15.0*  HGB 14.3  HCT 43.4  PLT 261   BMET:  Recent Labs    07/30/18 0237  NA 138  K 4.5  CL 103  CO2 27  GLUCOSE 143*  BUN 17  CREATININE 0.78  CALCIUM 8.7*    CMET: Lab Results  Component Value Date   WBC 15.0 (H) 07/30/2018   HGB 14.3 07/30/2018   HCT 43.4 07/30/2018   PLT 261 07/30/2018   GLUCOSE 143 (H) 07/30/2018   CHOL 140 04/08/2018   TRIG 64 04/08/2018   HDL 62 04/08/2018   LDLCALC 65 04/08/2018   ALT 23 07/26/2018   AST 27 07/26/2018   NA 138 07/30/2018   K 4.5 07/30/2018   CL 103 07/30/2018   CREATININE 0.78 07/30/2018   BUN 17 07/30/2018   CO2 27 07/30/2018   INR 1.0 07/24/2018   HGBA1C 6.0 (H) 06/03/2014      PT/INR: No results for input(s): LABPROT, INR in the last 72 hours. Radiology: No results found.  Assessment/Plan: S/P Procedure(s) (LRB): THORACOTOMY/RIGHT LOWER LOBECTOMY (Right) 1. CV-SR in the 70's this am.  On Coreg 3.125 mg bid (decreased from admission dose yesterday secondary to bradycardia). Will  restart Losartan for better BP control. 2. Pulmonary-On 1 liter via [place]. Chest tubes with 230 cc since surgery. Chest tubes are to water seal. There is tidling with cough but no true air leak. CXR this am appears stable. Will remove anterior chest tube. Mucinex bid. Encourage incentive spirometer. Final pathology:1. Pleura, biopsy, Right - PLEURA WITH CHRONIC INFLAMMATION. - NO MALIGNANCY IDENTIFIED. 2. Lung, resection (segmental or lobe), Right lower lobe - SQUAMOUS CELL CARCINOMA, 1.7 CM. - TUMOR FOCALLY INVOLVES VISCERAL PLEURA. - MARGINS NOT INVOLVED. - SIX LYMPH NODES WITH NO METASTATIC CARCINOMA. TNM Code:pT2a, pN0. Dr. Cyndia Bent to discuss with patient 3. Remove  foley 4. Transfer to [place] PA-C 07/31/2018 7:28 AM     Chart reviewed, patient examined, agree with above. Will increase Carvedilol to preop dose of 6.25 bid. Resume Losartan. Chest tube looks good and no air leak. DC anterior chest tube. Pain is under good control and not using much PCA morphine so will stop and use oral pain meds. Still has On-Q in. Transfer to [facility]. I called his wife and discussed status and path results. He will not require any further adjuvant therapy for this stage 1b squamous cell carcinoma.

## 2018-07-31 NOTE — Progress Notes (Signed)
Patient arrived from [facility] to 4E room 22 after thoracotomy with RLL lobectomy w/B[place]le. Pt has 2 rt CT to water seal, no air leak noted.  Telemetry monitor applied and CCMD notified.  Patient oriented to unit and room to include call light and phone.  Will continue to monitor.

## 2018-08-01 ENCOUNTER — Inpatient Hospital Stay (HOSPITAL_COMMUNITY): Payer: Medicare Other

## 2018-08-01 MED ORDER — ZOLPIDEM TARTRATE 5 MG PO TABS
5.0000 mg | ORAL_TABLET | Freq: Every day | ORAL | Status: AC
  Administered 2018-08-01: 5 mg via ORAL
  Filled 2018-08-01: qty 1

## 2018-08-01 MED ORDER — LOSARTAN POTASSIUM 50 MG PO TABS
50.0000 mg | ORAL_TABLET | Freq: Every day | ORAL | Status: DC
  Administered 2018-08-01: 50 mg via ORAL
  Filled 2018-08-01: qty 1

## 2018-08-01 MED ORDER — ENSURE ENLIVE PO LIQD
237.0000 mL | Freq: Three times a day (TID) | ORAL | Status: DC
  Administered 2018-08-01: 09:00:00 237 mL via ORAL

## 2018-08-01 NOTE — Progress Notes (Addendum)
[facility] DAILY ICU PROGRESS NOTE                   [address]            [place] [address]          [phone]   3 Days Post-Op Procedure(s) (LRB): THORACOTOMY/RIGHT LOWER LOBECTOMY (Right)  Total Length of Stay:  LOS: 3 days   Subjective: Patient has had intermittent nausea, no vomiting. He had a bowel movement. He is not eating much secondary to nausea. He is also not sleeping well. He states he knows his heart well by now and he feels his BP is still too high.  Objective: Vital signs in last 24 hours: Temp:  [98.5 F (36.9 C)-99 F (37.2 C)] 98.7 F (37.1 C) (07/02 0423) Pulse Rate:  [67-81] 77 (07/02 0423) Cardiac Rhythm: Normal sinus rhythm (07/01 2000) Resp:  [13-23] 15 (07/02 0423) BP: (123-159)/(72-88) 138/86 (07/02 0423) SpO2:  [90 %-95 %] 90 % (07/02 0423) Weight:  [73.3 kg] 73.3 kg (07/01 1401)  Filed Weights   07/30/18 0700 07/31/18 0600 07/31/18 1401  Weight: 72.5 kg 71.2 kg 73.3 kg      Intake/Output from previous day: 07/01 0701 - 07/02 0700 In: 563.5 [P.O.:480; I.V.:83.5] Out: 355 [Urine:175; Chest Tube:180]  Intake/Output this shift: No intake/output data recorded.  Current Meds: Scheduled Meds: . acetaminophen  1,000 mg Oral Q6H   Or  . acetaminophen (TYLENOL) oral liquid 160 mg/5 mL  1,000 mg Oral Q6H  . aspirin EC  81 mg Oral Daily  . atorvastatin  40 mg Oral Daily  . bisacodyl  10 mg Oral Daily  . carvedilol  6.25 mg Oral BID WC  . Chlorhexidine Gluconate Cloth  6 each Topical Daily  . enoxaparin (LOVENOX) injection  40 mg Subcutaneous Daily  . guaiFENesin  600 mg Oral BID  . losartan  25 mg Oral Daily  . metoCLOPramide (REGLAN) injection  10 mg Intravenous Q6H  . senna-docusate  1 tablet Oral QHS   Continuous Infusions: . bupivacaine 0.5 % ON-Q pump SINGLE CATH 400 mL     PRN Meds:.albuterol, hydrALAZINE, ketorolac, traMADol   Heart: RRR Lungs: Clear to auscultation on left and slightly diminished right base  Abdomen: Soft, non tender, high pitchedbowel sounds present Extremities: No LE edema Wound: Clean and dry Chest tube: are to water seal and no true air leak  Lab Results: CBC: Recent Labs    07/30/18 0237  WBC 15.0*  HGB 14.3  HCT 43.4  PLT 261   BMET:  Recent Labs    07/30/18 0237  NA 138  K 4.5  CL 103  CO2 27  GLUCOSE 143*  BUN 17  CREATININE 0.78  CALCIUM 8.7*    CMET: Lab Results  Component Value Date   WBC 15.0 (H) 07/30/2018   HGB 14.3 07/30/2018   HCT 43.4 07/30/2018   PLT 261 07/30/2018   GLUCOSE 143 (H) 07/30/2018   CHOL 140 04/08/2018   TRIG 64 04/08/2018   HDL 62 04/08/2018   LDLCALC 65 04/08/2018   ALT 23 07/26/2018   AST 27 07/26/2018   NA 138 07/30/2018   K 4.5 07/30/2018   CL 103 07/30/2018   CREATININE 0.78 07/30/2018   BUN 17 07/30/2018   CO2 27 07/30/2018   INR 1.0 07/24/2018   HGBA1C 6.0 (H) 06/03/2014    PT/INR: No results for input(s): LABPROT, INR in the last 72 hours. Radiology: No results  found.   Assessment/Plan: S/P Procedure(s) (LRB): THORACOTOMY/RIGHT LOWER LOBECTOMY (Right)   1. CV-SR in the 70's this am.  On Coreg 6.25 mg bid and   Losartan 25 mg daily and BP better but still a bit hypertensive. Will increase Losartan to 50 mg daily for better control. 2. Pulmonary-On room  Chest tube with 180 cc since surgery. Chest tubes are to water seal. There is tidling with cough but no true air leak. CXR this am appears stable. Will likely remove remaining chest tube. Mucinex bid. Encourage incentive spirometer.  3. GI-no abdominal tenderness, had bowel movement. Zofran PRN and on Reglan. Oxy stopped. Patient willing to try Ensure as not eating much 4. Will try low dose Ambien at night 5. Remove On Q 6. Possible discharge in am if nausea resolve;if not Saturday    Donielle Liston Alba PA-C 08/01/2018 7:10 AM      Chart reviewed, patient examined, agree with above. CXR looks good. No air leak from chest tube and output ok.  Will remove tube today. Still having some nausea. Has only been getting Tylenol for pain. Bowels working.

## 2018-08-01 NOTE — Care Management Important Message (Signed)
Important Message  Patient Details  Name: Kelly Olson. MRN: [id number] Date of Birth: 07-May-1950   Medicare Important Message Given:  Yes     Shelda Altes 08/01/2018, 2:31 PM

## 2018-08-01 NOTE — Progress Notes (Signed)
CT removed with 2nd RN.  Pt tolerated very well. Sterile pressure dressing applied Output charted and canister disposed in biohazard bag.  Pt appreciative and comfortable, drinking ensure, with call bell in reach.  Will con't plan of care.

## 2018-08-02 ENCOUNTER — Inpatient Hospital Stay (HOSPITAL_COMMUNITY): Payer: Medicare Other

## 2018-08-02 MED ORDER — FUROSEMIDE 40 MG PO TABS: 40.0000 mg | ORAL_TABLET | Freq: Every day | ORAL | 0 refills | Status: DC

## 2018-08-02 MED ORDER — LOSARTAN POTASSIUM 50 MG PO TABS: 50.0000 mg | ORAL_TABLET | Freq: Every day | ORAL | 1 refills | Status: DC

## 2018-08-02 NOTE — Progress Notes (Signed)
4 Days Post-Op Procedure(s) (LRB): THORACOTOMY/RIGHT LOWER LOBECTOMY (Right) Subjective: No complaints.  Slept well. Nausea resolved. Ambulating without shortness of breath  Objective: Vital signs in last 24 hours: Temp:  [97.5 F (36.4 C)-98.6 F (37 C)] 98.2 F (36.8 C) (07/03 0408) Pulse Rate:  [72-85] 74 (07/03 0408) Cardiac Rhythm: Normal sinus rhythm (07/02 2027) Resp:  [15-18] 16 (07/03 0408) BP: (114-143)/(68-80) 129/80 (07/03 0408) SpO2:  [93 %-95 %] 95 % (07/03 0408)  Hemodynamic parameters for last 24 hours:    Intake/Output from previous day: 07/02 0701 - 07/03 0700 In: 0  Out: 120 [Chest Tube:120] Intake/Output this shift: No intake/output data recorded.  General appearance: alert and cooperative Heart: regular rate and rhythm, S1, S2 normal, no murmur, click, rub or gallop Lungs: clear to auscultation bilaterally Wound: incision ok  Lab Results: No results for input(s): WBC, HGB, HCT, PLT in the last 72 hours. BMET: No results for input(s): NA, K, CL, CO2, GLUCOSE, BUN, CREATININE, CALCIUM in the last 72 hours.  PT/INR: No results for input(s): LABPROT, INR in the last 72 hours. ABG    Component Value Date/Time   PHART 7.423 07/24/2018 1014   HCO3 25.2 07/24/2018 1014   TCO2 23 06/05/2014 1652   ACIDBASEDEF 2.0 06/04/2014 1851   O2SAT 95.5 07/24/2018 1014   CBG (last 3)  Recent Labs    07/30/18 0738 07/30/18 1145  GLUCAP 99 147*   CXR: small right pleural effusion filling the basilar space after RLLobectomy. Lung otherwise clear.  Assessment/Plan: S/P Procedure(s) (LRB): THORACOTOMY/RIGHT LOWER LOBECTOMY (Right)  POD 4 Hemodynamically stable in sinus rhythm. CXR ok. There is a small right pleural effusion that I think is filling the residual basilar space. It should resolve with time. His wt is a few lbs over baseline so will give him a few days of lasix.  He would like to go home which I think is fine. Chest tube sutures out in a week and  I will see him back in 3 wks.   LOS: 4 days    Kelly Olson 08/02/2018

## 2018-08-05 ENCOUNTER — Telehealth: Payer: Self-pay | Admitting: Cardiology

## 2018-08-05 NOTE — Telephone Encounter (Signed)
Returned call to pt he states that he has been taking the increased dosage of losartan for 3 days not and BP is running 115-130/70's pt will continue the increased Losartan of 50mg  and he will continue to take BP he will CB if sx occur or BP runs too low/high

## 2018-08-05 NOTE — Telephone Encounter (Signed)
  Patient had surgery last week and Dr Cyndia Bent changed his losartan from 25mg  to 50mg  and Mr Kelly Olson was told to call and make sure Dr Ellyn Hack was okay with this change.

## 2018-08-08 ENCOUNTER — Other Ambulatory Visit: Payer: Self-pay | Admitting: *Deleted

## 2018-08-08 ENCOUNTER — Other Ambulatory Visit: Payer: Self-pay

## 2018-08-08 NOTE — Progress Notes (Signed)
The proposed treatment discussed at cancer conference 08/08/2018 is for discussion purpose only and is not a binding recommendation.  The patient was not physically examined nor present for their treatment options.  Therefore, final treatment plan cannot be decided.

## 2018-08-09 ENCOUNTER — Encounter (INDEPENDENT_AMBULATORY_CARE_PROVIDER_SITE_OTHER): Payer: Self-pay

## 2018-08-09 DIAGNOSIS — Z4802 Encounter for removal of sutures: Secondary | ICD-10-CM

## 2018-08-20 ENCOUNTER — Other Ambulatory Visit: Payer: Self-pay

## 2018-08-21 ENCOUNTER — Ambulatory Visit
Admission: RE | Admit: 2018-08-21 | Discharge: 2018-08-21 | Disposition: A | Discharge status: Home / Self Care | Payer: Medicare Other | Source: Ambulatory Visit | Attending: Surgery | Admitting: Surgery

## 2018-08-21 ENCOUNTER — Encounter: Payer: Self-pay | Admitting: Surgery

## 2018-08-21 ENCOUNTER — Ambulatory Visit (INDEPENDENT_AMBULATORY_CARE_PROVIDER_SITE_OTHER): Payer: Self-pay | Admitting: Surgery

## 2018-08-21 ENCOUNTER — Other Ambulatory Visit: Payer: Self-pay | Admitting: Surgery

## 2018-08-21 VITALS — BP 130/78 | HR 79 | Temp 97.8°F | Resp 20 | Ht 71.0 in | Wt 160.0 lb

## 2018-08-21 DIAGNOSIS — J9811 Atelectasis: Secondary | ICD-10-CM | POA: Diagnosis not present

## 2018-08-21 DIAGNOSIS — Z902 Acquired absence of lung [part of]: Secondary | ICD-10-CM

## 2018-08-21 DIAGNOSIS — Z9889 Other specified postprocedural states: Secondary | ICD-10-CM

## 2018-08-21 DIAGNOSIS — J90 Pleural effusion, not elsewhere classified: Secondary | ICD-10-CM | POA: Diagnosis not present

## 2018-08-21 DIAGNOSIS — C3431 Malignant neoplasm of lower lobe, right bronchus or lung: Secondary | ICD-10-CM

## 2018-08-21 NOTE — Progress Notes (Signed)
     HPI: Patient returns for routine postoperative follow-up having undergone right thoracotomy and right lower lobectomy on 07/29/2018.  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  6 lymph nodes were negative.  The stage was T2a, N0.  The patient's early postoperative recovery while in the hospital was notable for an uncomplicated postoperative course. Since hospital discharge the patient reports that he has been feeling well.  He is walking in his house without difficulty.  He has mild right chest wall soreness.  He has not noticed any significant shortness of breath but feels winded after climbing stairs repeatedly.   Current Outpatient Medications  Medication Sig Dispense Refill  . aspirin 81 MG tablet Take 81 mg by mouth daily.    Marland Kitchen atorvastatin (LIPITOR) 40 MG tablet Take 40 mg by mouth daily.    . carvedilol (COREG) 3.125 MG tablet Take 2 tablets (6.25 mg total) by mouth 2 (two) times daily with a meal. 360 tablet 3  . losartan (COZAAR) 50 MG tablet Take 1 tablet (50 mg total) by mouth daily. 30 tablet 1  . sildenafil (VIAGRA) 50 MG tablet Take 1 tablet (50 mg total) by mouth daily as needed for erectile dysfunction. 10 tablet 5  . furosemide (LASIX) 40 MG tablet Take 1 tablet (40 mg total) by mouth daily. 5 tablet 0   No current facility-administered medications for this visit.     Physical Exam: BP 130/78   Pulse 79   Temp 97.8 F (36.6 C) (Skin)   Resp 20   Ht 5\' 11"  (1.803 m)   Wt 160 lb (72.6 kg)   SpO2 95% Comment: RA  BMI 22.32 kg/m  He looks well. Cardiac exam shows regular rate and rhythm with normal heart sounds. Lung exam is clear. The right chest incision is healing well.  Diagnostic Tests:  CLINICAL DATA:  Status post right lobectomy.  EXAM: CHEST - 2 VIEW  COMPARISON:  Radiographs of August 02, 2018.  FINDINGS: The heart size and mediastinal contours are within normal  limits. Sternotomy wires are noted. No pneumothorax is noted. Left lung is clear. Mild right basilar atelectasis or scarring is noted with small pleural effusion. The visualized skeletal structures are unremarkable.  IMPRESSION: Mild right basilar atelectasis or scarring is noted with small right pleural effusion.   Electronically Signed   By: Marijo Conception M.D.   On: 08/21/2018 13:44  Impression:  He is doing well following right lower lobectomy for lung cancer.  His tumor stage was T2a, N0.  He was discussed at tumor conference and it was felt that he did not require any adjuvant therapy.  I encouraged him to continue walking as much as possible and to use his incentive spirometer.  I asked him not to do any heavy lifting or play golf for at least 8 weeks postoperatively and then to ease back into heavier activity.  Plan:  We will see him back in 2 months with a repeat chest x-ray.   Gaye Pollack, MD [facility] [phone]

## 2018-09-20 DIAGNOSIS — Z23 Encounter for immunization: Secondary | ICD-10-CM | POA: Diagnosis not present

## 2018-10-16 ENCOUNTER — Other Ambulatory Visit: Payer: Self-pay | Admitting: Cardiology

## 2018-10-21 ENCOUNTER — Other Ambulatory Visit: Payer: Self-pay | Admitting: Surgery

## 2018-10-21 DIAGNOSIS — Z902 Acquired absence of lung [part of]: Secondary | ICD-10-CM

## 2018-10-23 ENCOUNTER — Ambulatory Visit (INDEPENDENT_AMBULATORY_CARE_PROVIDER_SITE_OTHER): Payer: Self-pay | Admitting: Surgery

## 2018-10-23 ENCOUNTER — Ambulatory Visit
Admission: RE | Admit: 2018-10-23 | Discharge: 2018-10-23 | Disposition: A | Discharge status: Home / Self Care | Payer: Medicare Other | Source: Ambulatory Visit | Attending: Surgery | Admitting: Surgery

## 2018-10-23 ENCOUNTER — Other Ambulatory Visit: Payer: Self-pay

## 2018-10-23 ENCOUNTER — Encounter: Payer: Self-pay | Admitting: Surgery

## 2018-10-23 VITALS — BP 171/82 | HR 70 | Temp 97.7°F | Resp 18 | Ht 71.0 in | Wt 161.5 lb

## 2018-10-23 DIAGNOSIS — C3431 Malignant neoplasm of lower lobe, right bronchus or lung: Secondary | ICD-10-CM

## 2018-10-23 DIAGNOSIS — Z902 Acquired absence of lung [part of]: Secondary | ICD-10-CM

## 2018-10-23 DIAGNOSIS — J449 Chronic obstructive pulmonary disease, unspecified: Secondary | ICD-10-CM | POA: Diagnosis not present

## 2018-10-23 NOTE — Progress Notes (Signed)
     HPI:  Patient returns for routine postoperative follow-up having undergone right thoracotomy and right lower lobectomy on 07/29/2018.  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  6 lymph nodes were negative.  The stage was T2a, N0.  He has been walking regularly and building up stamina.  Current Outpatient Medications  Medication Sig Dispense Refill  . aspirin 81 MG tablet Take 81 mg by mouth daily.    Marland Kitchen atorvastatin (LIPITOR) 40 MG tablet Take 1 tablet by mouth every day at bedtime 90 tablet 3  . carvedilol (COREG) 3.125 MG tablet Take 2 tablets (6.25 mg total) by mouth 2 (two) times daily with a meal. 360 tablet 3  . losartan (COZAAR) 50 MG tablet Take 1 tablet (50 mg total) by mouth daily. (Patient taking differently: Take 25 mg by mouth daily. ) 30 tablet 1  . sildenafil (VIAGRA) 50 MG tablet Take 1 tablet (50 mg total) by mouth daily as needed for erectile dysfunction. 10 tablet 5   No current facility-administered medications for this visit.      Physical Exam: BP (!) 171/82 (BP Location: Right Arm, Patient Position: Sitting, Cuff Size: Normal)   Pulse 70   Temp 97.7 F (36.5 C)   Resp 18   Ht 5\' 11"  (1.803 m)   Wt 161 lb 8 oz (73.3 kg)   SpO2 97% Comment: RA  BMI 22.52 kg/m  Looks well. There is no cervical or supraclavicular adenopathy. Lungs are clear. The right thoracotomy incision is healing well.  Diagnostic Tests:  CLINICAL DATA:  Status post lobectomy  EXAM: CHEST - 2 VIEW  COMPARISON:  08/21/2018  FINDINGS: Normal heart size. CABG. Mild improvement in hazy opacity at the right base with stable lateral costophrenic sulcus blunting, likely scarring given the lateral view. Interstitial coarsening from COPD. There is no edema, consolidation, effusion, or pneumothorax.  IMPRESSION: 1. Mild improvement in right base aeration. 2. COPD and lung scarring.   Electronically  Signed   By: Monte Fantasia M.D.   On: 10/23/2018 09:54   Impression:  He is making excellent recovery following right lower lobectomy.  He is highly motivated to continue exercising regularly.  I told him he can return to normal activity as tolerated.  Plan:  We will see him back in 4 months with a CT of the chest to establish a new baseline postoperatively.   Gaye Pollack, MD [facility] [phone]

## 2018-11-29 DIAGNOSIS — E7800 Pure hypercholesterolemia, unspecified: Secondary | ICD-10-CM | POA: Diagnosis not present

## 2018-11-29 DIAGNOSIS — I2510 Atherosclerotic heart disease of native coronary artery without angina pectoris: Secondary | ICD-10-CM | POA: Diagnosis not present

## 2018-11-29 DIAGNOSIS — Z85118 Personal history of other malignant neoplasm of bronchus and lung: Secondary | ICD-10-CM | POA: Diagnosis not present

## 2018-11-29 DIAGNOSIS — I25810 Atherosclerosis of coronary artery bypass graft(s) without angina pectoris: Secondary | ICD-10-CM | POA: Diagnosis not present

## 2019-01-14 ENCOUNTER — Other Ambulatory Visit: Payer: Self-pay | Admitting: *Deleted

## 2019-01-14 DIAGNOSIS — Z85118 Personal history of other malignant neoplasm of bronchus and lung: Secondary | ICD-10-CM

## 2019-01-14 DIAGNOSIS — Z902 Acquired absence of lung [part of]: Secondary | ICD-10-CM

## 2019-02-19 ENCOUNTER — Ambulatory Visit (INDEPENDENT_AMBULATORY_CARE_PROVIDER_SITE_OTHER): Payer: Medicare Other | Admitting: Surgery

## 2019-02-19 ENCOUNTER — Ambulatory Visit
Admission: RE | Admit: 2019-02-19 | Discharge: 2019-02-19 | Disposition: A | Discharge status: Home / Self Care | Payer: Medicare Other | Source: Ambulatory Visit | Attending: Surgery | Admitting: Surgery

## 2019-02-19 ENCOUNTER — Encounter: Payer: Self-pay | Admitting: Surgery

## 2019-02-19 ENCOUNTER — Other Ambulatory Visit: Payer: Self-pay

## 2019-02-19 VITALS — BP 147/86 | HR 68 | Temp 96.6°F | Resp 16 | Ht 71.0 in | Wt 160.0 lb

## 2019-02-19 DIAGNOSIS — C3431 Malignant neoplasm of lower lobe, right bronchus or lung: Secondary | ICD-10-CM | POA: Diagnosis not present

## 2019-02-19 DIAGNOSIS — Z902 Acquired absence of lung [part of]: Secondary | ICD-10-CM

## 2019-02-19 DIAGNOSIS — C3490 Malignant neoplasm of unspecified part of unspecified bronchus or lung: Secondary | ICD-10-CM | POA: Diagnosis not present

## 2019-02-19 DIAGNOSIS — Z85118 Personal history of other malignant neoplasm of bronchus and lung: Secondary | ICD-10-CM

## 2019-02-19 NOTE — Progress Notes (Signed)
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 6 lymph nodes were negative. The stage was T2a,N0.  He continues to remain active walking several miles per day 5 days/week at home and lifting weights.  His stamina has improved since surgery and he denies any symptoms at this time.  Current Outpatient Medications  Medication Sig Dispense Refill  . aspirin 81 MG tablet Take 81 mg by mouth daily.    Marland Kitchen atorvastatin (LIPITOR) 40 MG tablet Take 1 tablet by mouth every day at bedtime 90 tablet 3  . carvedilol (COREG) 3.125 MG tablet Take 2 tablets (6.25 mg total) by mouth 2 (two) times daily with a meal. 360 tablet 3  . losartan (COZAAR) 50 MG tablet Take 1 tablet (50 mg total) by mouth daily. (Patient taking differently: Take 25 mg by mouth daily. ) 30 tablet 1  . sildenafil (VIAGRA) 50 MG tablet Take 1 tablet (50 mg total) by mouth daily as needed for erectile dysfunction. 10 tablet 5   No current facility-administered medications for this visit.     Physical Exam: BP (!) 147/86 (BP Location: Left Arm, Patient Position: Sitting, Cuff Size: Normal)   Pulse 68   Temp (!) 96.6 F (35.9 C)   Resp 16   Ht 5\' 11"  (1.803 m)   Wt 160 lb (72.6 kg)   SpO2 98% Comment: RA  BMI 22.32 kg/m  He looks well. There is no cervical or supraclavicular adenopathy. Lungs are clear. Right chest scars all look good. Cardiac exam shows a regular rate and rhythm with normal heart sounds.  Diagnostic Tests:  CLINICAL DATA:  Non-small cell lung cancer restaging  EXAM: CT CHEST WITHOUT CONTRAST  TECHNIQUE: Multidetector CT imaging of the chest was performed following the standard protocol without IV contrast.  COMPARISON:  Multiple exams, including PET-CT from 07/17/2018 and chest CT from  07/10/2018  FINDINGS: Cardiovascular: Coronary, aortic arch, and branch vessel atherosclerotic vascular disease. Prior CABG. No overt cardiomegaly.  Mediastinum/Nodes: No overtly pathologic adenopathy identified.  Lungs/Pleura: Centrilobular emphysema. Scarring is present adjacent to a 0.9 by 0.7 cm subpleural nodule in the right middle lobe on image 120/8 which is stable in size 06/13/2016 was not significantly hypermetabolic on 76/19/5093.  Interval right lower lobectomy noted. Small but increased right pleural effusion with adjacent passive atelectasis. The previous hypermetabolic nodule that was shown in right lower lobe adjacent to the pleural margin is no longer present, and likely removed with lobectomy.  Stable ground-glass density 0.5 by 0.4 cm left upper lobe nodule on image 80/8. Stable 0.4 cm calcified granuloma in the lingula on image 95/8. Stable 3 mm lingular nodule on image 107/8.  1.0 by 0.7 cm left lower lobe nodule on image 120/8 is unchanged by my measurements. 0.5 by 0.4 cm posterior basal segment left lower lobe nodule on image 121/8 is stable. 0.7 by 0.5 cm left lower lobe nodule on image 104/8 previously measured 0.6 by 0.5 cm. 0.7 by 0.5 cm right lower lobe nodule on image 105/a previously measured 0.6 by 0.5 cm. These nodules were not observed to be hypermetabolic on the prior PET-CT of 07/17/2018. These nodules are roughly stable back through 06/13/2016.  Peripheral interstitial accentuation at the lung bases favoring mild fibrosis.  Upper Abdomen: Abdominal aortic atherosclerotic calcification.  Musculoskeletal: Healed right lateral rib fractures.  IMPRESSION: 1. Interval  right lower lobectomy. The previous hypermetabolic nodule in the right lower lobe was removed with lobectomy. 2. Multiple bilateral pulmonary nodules are chronically stable in size from 06/13/2016, and not hypermetabolic on prior PET-CT, although some are below  sensitive PET-CT size thresholds. 3. Small but increased right pleural effusion with adjacent passive atelectasis. 4. Other imaging findings of potential clinical significance: Coronary, aortic arch, and branch vessel atherosclerotic vascular disease. Peripheral interstitial accentuation at the lung bases favoring mild fibrosis.  Aortic Atherosclerosis (ICD10-I70.0) and Emphysema (ICD10-J43.9).   Electronically Signed   By: Van Clines M.D.   On: 02/19/2019 10:17   Impression:  He is doing well almost 7 months following lung cancer resection.  His CT scan shows no evidence of recurrent cancer and no new lesions.  There are multiple small bilateral pulmonary nodules which are stable on CT dating back to May 2018.  I reviewed the CT images with him and answered all of his questions.  I recommended that he have a follow-up CT scan in 6 months.  Plan:  I will see him back in 6 months with a CT scan of the chest without contrast.  I spent 15 minutes performing this established patient evaluation and > 50% of this time was spent face to face counseling and coordinating the surveillance of his resected lung cancer.    Gaye Pollack, MD [facility] [phone]

## 2019-02-25 ENCOUNTER — Ambulatory Visit: Payer: Medicare Other

## 2019-03-06 ENCOUNTER — Ambulatory Visit: Payer: Medicare Other | Attending: Internal Medicine

## 2019-03-06 DIAGNOSIS — Z23 Encounter for immunization: Secondary | ICD-10-CM | POA: Insufficient documentation

## 2019-03-06 NOTE — Progress Notes (Signed)
   [id number]  Name:  Kelly Olson.    MRN: [id number] DOB: 1950/05/12  03/06/2019  [name] was observed post Covid-19 immunization for 15 minutes without incidence. He was provided with Vaccine Information Sheet and instruction to access the V-Safe system.   [name] was instructed to call 911 with any severe reactions post vaccine: Marland Kitchen Difficulty breathing  . Swelling of your face and throat  . A fast heartbeat  . A bad rash all over your body  . Dizziness and weakness    Immunizations Administered    Name Date Dose VIS Date Route   Pfizer COVID-19 Vaccine 03/06/2019  5:07 PM 0.3 mL 01/10/2019 Intramuscular   Manufacturer: [place]   Lot: [id number]   [place]: [id number]

## 2019-03-14 ENCOUNTER — Ambulatory Visit: Payer: Medicare Other

## 2019-04-01 ENCOUNTER — Ambulatory Visit: Payer: Medicare Other | Attending: Internal Medicine

## 2019-04-01 DIAGNOSIS — Z23 Encounter for immunization: Secondary | ICD-10-CM | POA: Insufficient documentation

## 2019-04-01 NOTE — Progress Notes (Signed)
   [id number]  Name:  Shain Pauwels.    MRN: [id number] DOB: 06/01/1950  04/01/2019  Mr. Mottern was observed post Covid-19 immunization for 15 minutes without incident. He was provided with Vaccine Information Sheet and instruction to access the V-Safe system.   Mr. Kauffman was instructed to call 911 with any severe reactions post vaccine: Marland Kitchen Difficulty breathing  . Swelling of face and throat  . A fast heartbeat  . A bad rash all over body  . Dizziness and weakness   Immunizations Administered    Name Date Dose VIS Date Route   Pfizer COVID-19 Vaccine 04/01/2019  8:26 AM 0.3 mL 01/10/2019 Intramuscular   Manufacturer: [place]   Lot: [id number]   [place]: [id number]

## 2019-04-30 ENCOUNTER — Other Ambulatory Visit: Payer: Self-pay

## 2019-05-02 ENCOUNTER — Other Ambulatory Visit: Payer: Self-pay

## 2019-05-02 MED ORDER — ATORVASTATIN CALCIUM 40 MG PO TABS: 40.0000 mg | ORAL_TABLET | Freq: Every day | ORAL | 3 refills | Status: DC

## 2019-05-02 MED ORDER — CARVEDILOL 3.125 MG PO TABS: 6.2500 mg | ORAL_TABLET | Freq: Two times a day (BID) | ORAL | 3 refills | Status: DC

## 2019-05-02 MED ORDER — LOSARTAN POTASSIUM 25 MG PO TABS: 25.0000 mg | ORAL_TABLET | Freq: Every day | ORAL | 3 refills | Status: DC

## 2019-06-19 DIAGNOSIS — L989 Disorder of the skin and subcutaneous tissue, unspecified: Secondary | ICD-10-CM | POA: Diagnosis not present

## 2019-06-19 DIAGNOSIS — I25810 Atherosclerosis of coronary artery bypass graft(s) without angina pectoris: Secondary | ICD-10-CM | POA: Diagnosis not present

## 2019-06-19 DIAGNOSIS — Z1389 Encounter for screening for other disorder: Secondary | ICD-10-CM | POA: Diagnosis not present

## 2019-06-19 DIAGNOSIS — Z23 Encounter for immunization: Secondary | ICD-10-CM | POA: Diagnosis not present

## 2019-06-19 DIAGNOSIS — R7301 Impaired fasting glucose: Secondary | ICD-10-CM | POA: Diagnosis not present

## 2019-06-19 DIAGNOSIS — I700 Atherosclerosis of aorta: Secondary | ICD-10-CM | POA: Diagnosis not present

## 2019-06-19 DIAGNOSIS — Z85118 Personal history of other malignant neoplasm of bronchus and lung: Secondary | ICD-10-CM | POA: Diagnosis not present

## 2019-06-19 DIAGNOSIS — Z1159 Encounter for screening for other viral diseases: Secondary | ICD-10-CM | POA: Diagnosis not present

## 2019-06-19 DIAGNOSIS — E7800 Pure hypercholesterolemia, unspecified: Secondary | ICD-10-CM | POA: Diagnosis not present

## 2019-06-19 DIAGNOSIS — Z0000 Encounter for general adult medical examination without abnormal findings: Secondary | ICD-10-CM | POA: Diagnosis not present

## 2019-06-19 DIAGNOSIS — N529 Male erectile dysfunction, unspecified: Secondary | ICD-10-CM | POA: Diagnosis not present

## 2019-07-03 DIAGNOSIS — I788 Other diseases of capillaries: Secondary | ICD-10-CM | POA: Diagnosis not present

## 2019-07-03 DIAGNOSIS — L905 Scar conditions and fibrosis of skin: Secondary | ICD-10-CM | POA: Diagnosis not present

## 2019-07-03 DIAGNOSIS — L814 Other melanin hyperpigmentation: Secondary | ICD-10-CM | POA: Diagnosis not present

## 2019-07-03 DIAGNOSIS — C44222 Squamous cell carcinoma of skin of right ear and external auricular canal: Secondary | ICD-10-CM | POA: Diagnosis not present

## 2019-07-03 DIAGNOSIS — L821 Other seborrheic keratosis: Secondary | ICD-10-CM | POA: Diagnosis not present

## 2019-07-03 DIAGNOSIS — L570 Actinic keratosis: Secondary | ICD-10-CM | POA: Diagnosis not present

## 2019-07-03 DIAGNOSIS — Z85828 Personal history of other malignant neoplasm of skin: Secondary | ICD-10-CM | POA: Diagnosis not present

## 2019-07-14 ENCOUNTER — Other Ambulatory Visit: Payer: Self-pay | Admitting: *Deleted

## 2019-07-14 DIAGNOSIS — E7800 Pure hypercholesterolemia, unspecified: Secondary | ICD-10-CM | POA: Diagnosis not present

## 2019-07-14 DIAGNOSIS — I2510 Atherosclerotic heart disease of native coronary artery without angina pectoris: Secondary | ICD-10-CM | POA: Diagnosis not present

## 2019-07-14 DIAGNOSIS — I25810 Atherosclerosis of coronary artery bypass graft(s) without angina pectoris: Secondary | ICD-10-CM | POA: Diagnosis not present

## 2019-07-14 DIAGNOSIS — C3431 Malignant neoplasm of lower lobe, right bronchus or lung: Secondary | ICD-10-CM

## 2019-07-14 DIAGNOSIS — Z85118 Personal history of other malignant neoplasm of bronchus and lung: Secondary | ICD-10-CM | POA: Diagnosis not present

## 2019-07-15 DIAGNOSIS — H31001 Unspecified chorioretinal scars, right eye: Secondary | ICD-10-CM | POA: Diagnosis not present

## 2019-08-07 ENCOUNTER — Encounter: Payer: Self-pay | Admitting: Cardiology

## 2019-08-07 NOTE — Progress Notes (Signed)
Primary Care Provider: [name], MD Cardiologist: Glenetta Hew, MD Electrophysiologist: None  Clinic Note: Chief Complaint  Patient presents with  . Follow-up    Delayed annual - No complaints  . Coronary Artery Disease    s/p CABG --> no angina or CHF Sx    HPI:    Kelly Olson. is a 69 y.o. 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  DIAGNOSED WITH MV CAD IN MAY 2016  Initial presented with SSx c/w Class II-III Angina --> (had previously been walking several miles daily)  High Risk Myoview (April 2016) -> Cath  Cath with MV CAD (100% LAD, 99% mRCA-100% dRCA & 90% p-m Cx) w/ EF 35-45% --> CABG  CABG: LIMA-OM, RIMA-LAD, SVG-Diag, SVG-rPDA (Dr. Cyndia Bent) ? Repeat Echo: EF 50-55% ? Continues with Maintenance Program of Cardiac Rehab. ? 07/10/16: s/p R thoracotomy for RLL nodule wedge resection -- Squamous Cell CA.(Dr. Cyndia Bent) - negative margins. - complicated by air leak (Sub Cu emphysema - redo chest tube x 2) ? 07/29/2018: Right Lower Lobectomy via Right Thoracotomy (Dr. Cyndia Bent for + Pet CT showing recurrent SS Lung Ca (Stage T2a, N0)  --> not complicated by Sub Cu emphysema this time.   Kelly Night [place]. was last seen on April 08, 2018.  He was doing relatively well from a cardiac standpoint.  Still playing golf routinely several days a week and doing exercises at the gym on days that he was not doing the maintenance cardiac rehab program.  Unfortunately with cardiac rehab being shut down during COVID-19 this was no longer an option.  Was quite stressed out during this visit because they were going to have to euthanize their dog.  Blood pressure was quite high as he had  had no sleep.  Routine blood pressure at home in a cardiac rehab was in the 110/80  mmHg range.  Surveillance Myoview ordered along with chemistry and lipids  Recent Hospitalizations:   July 29, 2018: RIGHT LOWER LOBECTOMY VIA RIGHT THORACOTOMY FOR RIGHT LOWER LOBE MASS; losartan dose was increased to 50 mg.  Final pathology showed 1.7 cm squamous cell carcinoma with focal pleural invasion with negative surgical margins.  6 lymph nodes were negative.  Stage was T2a, N0.  Follow-up visit in September 2020 indicated he is doing very well postop.  Only noted exertional dyspnea climbing stairs repeatedly.  As of September 2020 was walking regularly building up stamina.  Reviewed  CV studies:    The following studies were reviewed today: (if available, images/films reviewed: From Epic Chart or Care Everywhere) . 04/11/2018-Myoview: EF 50-55%.  Small basal inferior fixed defect consistent with diaphragmatic attenuation.  No ischemia or infarction.  Septal dyskinesis due to post CABG state  Interval History:   Kelly Schalk. returns here today for what is a delayed annual follow-up doing amazingly well for someone who is now had his third chest surgery.  He is pretty much fully recovered from his most recent surgery and said that actually got 1 better than the first time around.  He is now up to walking 3-1/2 miles 5 to 6 days out of the week at a brisk pace.  He is still regarding the fact  that he cannot get back into doing the cardiac rehab maintenance program but is trying to do everything that he was doing including some of the machine exercises.  He says that he actually is less short of breath with activity now than he was prior to his previous surgery.  He gets a little short of breath if he starts off too quick, or goes to heart for too long.  Otherwise with normal activities including his brisk walking pace he has no real significant dyspnea.  He is also very conscious of his  dietary intake and is making sure that his weight stays stable.  He also still placed at least 18 holes of golf 2-3 days a week.  CV Review of Symptoms (Summary) Cardiovascular ROS: no chest pain or dyspnea on exertion negative for - edema, irregular heartbeat, orthopnea, palpitations, paroxysmal nocturnal dyspnea, rapid heart rate, shortness of breath or Lightheadedness, dizziness, wooziness, syncope/near syncope or TIA/amaurosis fugax, claudication  The patient does not have symptoms concerning for COVID-19 infection (fever, chills, cough, or new shortness of breath).  The patient is practicing social distancing & Masking.  Immunization History  Administered Date(s) Administered  . PFIZER SARS-COV-2 Vaccination 03/06/2019, 04/01/2019    REVIEWED OF SYSTEMS   Review of Systems  Constitutional: Negative for malaise/fatigue and weight loss.  HENT: Negative for congestion and nosebleeds.   Respiratory: Negative for cough and shortness of breath.   Gastrointestinal: Negative for blood in stool and melena.  Genitourinary: Negative for hematuria.  Musculoskeletal: Positive for joint pain (Only mild aches and pains but nothing significant).  Neurological: Negative for dizziness and focal weakness.  Psychiatric/Behavioral: Negative.    I have reviewed and (if needed) personally updated the patient's problem list, medications, allergies, past medical and surgical history, social and family history.   PAST MEDICAL HISTORY   Past Medical History:  Diagnosis Date  . Coronary artery disease, occlusive 06/02/2014   Multivessel CAD.mLAD-100%, mRCA 99%, dRCA 100%, mLCX 90% And EF 35-45%. -->  Referred for CABG; nonischemic Myoview March 2020  . Former heavy tobacco smoker    Quit in April 2016   . Hyperlipidemia with target LDL less than 70   . Ischemic cardiomyopathy - resolved 05/2014   Myoview: EF ~33% with "infarction vs. severe resting ischemia in LAD & RCA territory; b) EF by Cath:  35-45%. c) post CABG Echo 9016: EF 50-55%  . S/P CABG x 4 06/04/2014   LIMA-OM, RIMA-LAD, SVG-Diag, SVG-rPDA  . Squamous cell lung cancer, RLL / s/p RLL LOBECTOMY (T2 a, N0)    right lung lower lobe -> right lower lobe nodule resection June 2018 (VATS); R thoracotomy with RLL Lobectomy for recurrent PET positive cancer.   Stage T2 a, N0    PAST SURGICAL HISTORY   Past Surgical History:  Procedure Laterality Date  . CARDIAC CATHETERIZATION N/A 06/02/2014   Procedure: Left Heart Cath and Coronary Angiography;  Surgeon: Leonie Man, MD;  Location: [facility];  Service: Cardiovascular;  mLAD-100%, mRCA 99%, dRCA 100%, mLCX 90% And EF 35-45%.  . CORONARY ARTERY BYPASS GRAFT N/A 06/04/2014   Procedure: CORONARY ARTERY BYPASS GRAFTING (CABG) times four using bilateral internal mammary arteries and EVH for left  leg saphenous vein;  Surgeon: Gaye Pollack, MD;  Location: [facility];  Service: Open Heart Surgery;  LIMA-OM, RIMA-LAD, SVG-Diag, SVG-rPDA  . NM MYOVIEW LTD  05/28/2014   Pre-CABG:  High Risk Nuclear Stress Test with multivessel distribution ischemia. -- Severely ischemic  Cardiomyoapthy with evidence of at least 2 vessel disease & EF of ~33%. Images are consistent with either infarction or severe resting ischemia in the LAD & RCA territory.  Marland Kitchen NM MYOVIEW LTD  03/2018   EF 50-55%.  Small basal inferior fixed defect consistent with diaphragmatic attenuation.  No ischemia or infarction.  Septal dyskinesis due to post CABG state  . TEE WITHOUT CARDIOVERSION N/A 06/04/2014   Procedure: TRANSESOPHAGEAL ECHOCARDIOGRAM (TEE);  Surgeon: Gaye Pollack, MD;  Location: [facility];  Service: Open Heart Surgery;  Laterality: N/A;  . THORACOTOMY Right 07/10/2016   Procedure: THORACOTOMY;  Surgeon: Gaye Pollack, MD;  Location: [facility];  Service: Thoracic;  Laterality: Right;  . THORACOTOMY/LOBECTOMY Right 07/29/2018   Procedure: THORACOTOMY/RIGHT LOWER LOBECTOMY;  Surgeon: Gaye Pollack, MD;   Location: [place] [facility];  Service: Thoracic;  Laterality: Right;  . TONSILLECTOMY  1957  . TRANSTHORACIC ECHOCARDIOGRAM  10/2014   Mild concentric LVH. EF 50-55% with mild HK of basal anteroseptal myocardium. GR 1 DD.  Marland Kitchen VIDEO ASSISTED THORACOSCOPY (VATS)/WEDGE RESECTION Right 07/10/2016   Procedure: VIDEO ASSISTED THORACOSCOPY (VATS)/WEDGE RESECTION;  Surgeon: Gaye Pollack, MD;  Location: [facility];  Service: Thoracic;  Laterality: Right;    MEDICATIONS/ALLERGIES   Current Meds  Medication Sig  . aspirin 81 MG tablet Take 81 mg by mouth daily.  Marland Kitchen atorvastatin (LIPITOR) 40 MG tablet Take 1 tablet (40 mg total) by mouth at bedtime.  . carvedilol (COREG) 3.125 MG tablet Take 2 tablets (6.25 mg total) by mouth 2 (two) times daily with a meal.  . losartan (COZAAR) 25 MG tablet Take 1 tablet (25 mg total) by mouth daily.  . sildenafil (VIAGRA) 50 MG tablet Take 1 tablet (50 mg total) by mouth daily as needed for erectile dysfunction.    No Known Allergies  SOCIAL HISTORY/FAMILY HISTORY   Reviewed in Epic:  Pertinent findings: No changes  OBJCTIVE -PE, EKG, labs   Wt Readings from Last 3 Encounters:  08/08/19 162 lb 3.2 oz (73.6 kg)  02/19/19 160 lb (72.6 kg)  10/23/18 161 lb 8 oz (73.3 kg)    Physical Exam: BP 120/62   Pulse 69   Ht 5' 11.5" (1.816 m)   Wt 162 lb 3.2 oz (73.6 kg)   SpO2 93%   BMI 22.31 kg/m  Physical Exam Constitutional:      General: He is not in acute distress.    Appearance: Normal appearance. He is normal weight.     Comments: Healthy-appearing.  Well-groomed.  HENT:     Head: Normocephalic and atraumatic.     Comments: Mild exophthalmos Neck:     Vascular: No carotid bruit, hepatojugular reflux or JVD.  Cardiovascular:     Rate and Rhythm: Normal rate and regular rhythm.  No extrasystoles are present.    Chest Wall: PMI is not displaced.     Pulses: Normal pulses.     Heart sounds: Normal heart sounds. No murmur heard.  No friction rub. No gallop.       Comments: Well-healed sternotomy scar Pulmonary:     Effort: Pulmonary effort is normal. No respiratory distress.     Breath sounds: Normal breath sounds.     Comments: Well-healed thoracotomy and sternotomy scars; slightly diminished sounds in the right side than left Chest:     Chest wall: No tenderness.  Abdominal:     General: Abdomen is flat. Bowel sounds are normal.     Palpations: Abdomen is soft.  Comments: No HSM  Musculoskeletal:        General: No swelling or tenderness. Normal range of motion.     Cervical back: Normal range of motion and neck supple.  Neurological:     General: No focal deficit present.     Mental Status: He is alert and oriented to person, place, and time.  Psychiatric:        Mood and Affect: Mood normal.        Behavior: Behavior normal.        Thought Content: Thought content normal.        Judgment: Judgment normal.     Adult ECG Report  Rate: 69 ;  Rhythm: normal sinus rhythm and ~RVH with ~ incomplete RBBB. CRO Anterior MI, age undetermined.;   Narrative Interpretation: stable EKG  Recent Labs: Recently checked by PCP 06/19/2019  TC 132, TG 46, HDL 59, LDL 62.  A1c 6.4.  Cr 0.91. Lab Results  Component Value Date   CHOL 140 04/08/2018   HDL 62 04/08/2018   LDLCALC 65 04/08/2018   TRIG 64 04/08/2018   CHOLHDL 2.3 04/08/2018   Lab Results  Component Value Date   CREATININE 0.78 07/30/2018   BUN 17 07/30/2018   NA 138 07/30/2018   K 4.5 07/30/2018   CL 103 07/30/2018   CO2 27 07/30/2018   No results found for: TSH  ASSESSMENT/PLAN    Problem List Items Addressed This Visit    Coronary artery disease involving native coronary artery without angina pectoris (Chronic)    Significantly abnormal Myoview ordered by PCP back in 2016 for classic symptoms concerning for Class II-III angina.  Significant reduced EF of 30-35% with severe resting ischemia in the LAD and RCA territory --> post CABG, EF improved up to 50 and 55% Most  recent Myoview in March 2020 only showed a small basal inferior fixed defect with no evidence of ischemia.  No significant inferior wall motion normality would argue that it could be diaphragmatic attenuation versus prior infarct.  No further angina symptoms following his CABG.  He is fully recovered now from 2 lung surgeries back to walking 3 [place] a day.  Plan:   Continue aspirin and statin at current doses.    Also continue on stable low doses of carvedilol and losartan.      Relevant Orders   EKG 12-Lead   Hx of CABG (Chronic)    Somewhat surreptitiously, we checked a 4-year surveillance Myoview last year which was nonischemic.  As it turned out this worked out peripherally for preop evaluation for his Right Lower Lobe Lobectomy via Thoracotomy a couple months later.  Would not be due for another ischemic evaluation until 2024 unless symptoms warrant.      Former heavy tobacco smoker - quit when he had diagnoses of CAD (Chronic)    He put down cigarettes the night after he found out the results of his stress test in 2016, and has not looked back.  The impetus was large enough that it even had his wife quit smoking.  He now notes that his breathing has improved dramatically from a combination of having had CABG but also not having the morning cough and phlegm.  I again congratulated him on continued efforts and encouraged continued abstinence.      Hyperlipidemia with target LDL less than 70 (Chronic)    Excellent labs with current dose of statin.  LDL stable in the 60s.  We did reduce his atorvastatin to  40 mg.  He is tolerating it well without any major issues.  When we went down to 20, his LDL went up so we are going to maintain 40 mg.  Plan: Continue current dose of atorvastatin and ongoing dietary modification/exercise      Cardiomyopathy, ischemic: EF ~35-45% by LV Gram --> 50 and 55% by echo - Primary (Chronic)    Essentially resolved post CABG.  Remains on stable dose of  beta-blocker and ARB.  No CHF symptoms, euvolemic.      Relevant Orders   EKG 12-Lead       COVID-19 Education: The signs and symptoms of COVID-19 were discussed with the patient and how to seek care for testing (follow up with PCP or arrange E-visit).   The importance of social distancing and COVID-19 vaccination was discussed today.  I spent a total of 54minutes with the patient. >  50% of the time was spent in direct patient consultation.  Additional time spent with chart review  / charting (studies, outside notes, etc): 10 Total Time: 32 min   Current medicines are reviewed at length with the patient today.  (+/- concerns) n/.a  Notice: This dictation was prepared with Dragon dictation along with smaller phrase technology. Any transcriptional errors that result from this process are unintentional and may not be corrected upon review.  Patient Instructions / Medication Changes & Studies & Tests Ordered   Patient Instructions  Medication Instructions:  No changes *If you need a refill on your cardiac medications before your next appointment, please call your pharmacy*   Lab Work: Not needed   Testing/Procedures: Not needed   Follow-Up: At [facility], you and your health needs are our priority.  As part of our continuing mission to provide you with exceptional heart care, we have created designated Provider Care Teams.  These Care Teams include your primary Cardiologist (physician) and Advanced Practice Providers (APPs -  Physician Assistants and Nurse Practitioners) who all work together to provide you with the care you need, when you need it.    Your next appointment:   10 to11 month(s)  The format for your next appointment:   In Person  Provider:   Glenetta Hew, MD       Studies Ordered:   Orders Placed This Encounter  Procedures  . EKG 12-Lead     Glenetta Hew, M.D., M.S. Interventional Cardiologist   Pager # [phone] Phone #  [phone] [address] [place], [place] [address]   Thank you for choosing Heartcare at [facility]!!

## 2019-08-08 ENCOUNTER — Ambulatory Visit (INDEPENDENT_AMBULATORY_CARE_PROVIDER_SITE_OTHER): Payer: Medicare Other | Admitting: Cardiology

## 2019-08-08 ENCOUNTER — Encounter: Payer: Self-pay | Admitting: Cardiology

## 2019-08-08 ENCOUNTER — Other Ambulatory Visit: Payer: Self-pay

## 2019-08-08 VITALS — BP 120/62 | HR 69 | Ht 71.5 in | Wt 162.2 lb

## 2019-08-08 DIAGNOSIS — I2510 Atherosclerotic heart disease of native coronary artery without angina pectoris: Secondary | ICD-10-CM | POA: Diagnosis not present

## 2019-08-08 DIAGNOSIS — I255 Ischemic cardiomyopathy: Secondary | ICD-10-CM | POA: Diagnosis not present

## 2019-08-08 DIAGNOSIS — E785 Hyperlipidemia, unspecified: Secondary | ICD-10-CM | POA: Diagnosis not present

## 2019-08-08 DIAGNOSIS — Z951 Presence of aortocoronary bypass graft: Secondary | ICD-10-CM

## 2019-08-08 DIAGNOSIS — Z87891 Personal history of nicotine dependence: Secondary | ICD-10-CM

## 2019-08-08 NOTE — Patient Instructions (Signed)
Medication Instructions:  No changes *If you need a refill on your cardiac medications before your next appointment, please call your pharmacy*   Lab Work: Not needed   Testing/Procedures: Not needed   Follow-Up: At [facility], you and your health needs are our priority.  As part of our continuing mission to provide you with exceptional heart care, we have created designated Provider Care Teams.  These Care Teams include your primary Cardiologist (physician) and Advanced Practice Providers (APPs -  Physician Assistants and Nurse Practitioners) who all work together to provide you with the care you need, when you need it.    Your next appointment:   10 to11 month(s)  The format for your next appointment:   In Person  Provider:   Glenetta Hew, MD

## 2019-08-10 ENCOUNTER — Encounter: Payer: Self-pay | Admitting: Cardiology

## 2019-08-10 NOTE — Assessment & Plan Note (Addendum)
Essentially resolved post CABG.  Remains on stable dose of beta-blocker and ARB.  No CHF symptoms, euvolemic.

## 2019-08-10 NOTE — Assessment & Plan Note (Signed)
He put down cigarettes the night after he found out the results of his stress test in 2016, and has not looked back.  The impetus was large enough that it even had his wife quit smoking.  He now notes that his breathing has improved dramatically from a combination of having had CABG but also not having the morning cough and phlegm.  I again congratulated him on continued efforts and encouraged continued abstinence.

## 2019-08-10 NOTE — Assessment & Plan Note (Addendum)
Significantly abnormal Myoview ordered by PCP back in 2016 for classic symptoms concerning for Class II-III angina.  Significant reduced EF of 30-35% with severe resting ischemia in the LAD and RCA territory --> post CABG, EF improved up to 50 and 55% Most recent Myoview in March 2020 only showed a small basal inferior fixed defect with no evidence of ischemia.  No significant inferior wall motion normality would argue that it could be diaphragmatic attenuation versus prior infarct.  No further angina symptoms following his CABG.  He is fully recovered now from 2 lung surgeries back to walking 3 [place] a day.  Plan:   Continue aspirin and statin at current doses.    Also continue on stable low doses of carvedilol and losartan.

## 2019-08-10 NOTE — Assessment & Plan Note (Signed)
Somewhat surreptitiously, we checked a 4-year surveillance Myoview last year which was nonischemic.  As it turned out this worked out peripherally for preop evaluation for his Right Lower Lobe Lobectomy via Thoracotomy a couple months later.  Would not be due for another ischemic evaluation until 2024 unless symptoms warrant.

## 2019-08-10 NOTE — Assessment & Plan Note (Addendum)
Excellent labs with current dose of statin.  LDL stable in the 60s.  We did reduce his atorvastatin to 40 mg.  He is tolerating it well without any major issues.  When we went down to 20, his LDL went up so we are going to maintain 40 mg.  Plan: Continue current dose of atorvastatin and ongoing dietary modification/exercise

## 2019-08-13 ENCOUNTER — Ambulatory Visit
Admission: RE | Admit: 2019-08-13 | Discharge: 2019-08-13 | Disposition: A | Discharge status: Home / Self Care | Payer: Medicare Other | Source: Ambulatory Visit | Attending: Surgery | Admitting: Surgery

## 2019-08-13 ENCOUNTER — Telehealth (INDEPENDENT_AMBULATORY_CARE_PROVIDER_SITE_OTHER): Payer: Medicare Other | Admitting: Surgery

## 2019-08-13 ENCOUNTER — Other Ambulatory Visit: Payer: Self-pay

## 2019-08-13 DIAGNOSIS — C3432 Malignant neoplasm of lower lobe, left bronchus or lung: Secondary | ICD-10-CM

## 2019-08-13 DIAGNOSIS — C3491 Malignant neoplasm of unspecified part of right bronchus or lung: Secondary | ICD-10-CM | POA: Diagnosis not present

## 2019-08-13 DIAGNOSIS — C3431 Malignant neoplasm of lower lobe, right bronchus or lung: Secondary | ICD-10-CM

## 2019-08-14 ENCOUNTER — Encounter: Payer: Self-pay | Admitting: Surgery

## 2019-08-14 NOTE — Progress Notes (Addendum)
Patient ID: Kelly Setters., male   DOB: 1950/10/21, 69 y.o.   MRN: [id number]      [address]       [place] [address]             [place] VIRTUAL OFFICE NOTE  Referring Provider is [name], MD Primary Cardiologist is Glenetta Hew, MD PCP is [name], MD   HPI:  I spoke with Kelly Olson. (DOB [birth year] ) via telephone on 08/13/2019 at 5:20 PM and verified that I was speaking with the correct person using more than one form of identification.  We discussed the reason(s) for conducting our visit virtually instead of in-person.  The patient expressed understanding the circumstances and agreed to proceed as described.  Patient: home Provider: office  Patient returns for surveillance follow-up having undergoneright thoracotomy and right lower lobectomyon 07/29/2018.The final pathology showed a 1.7 cm squamous cell carcinoma with focal pleural invasion but negative surgical margins. 6 lymph nodes were negative. The stage was T2a,N0.  He continues to remain active and exercise regularly.  He denies any chest pain or shortness of breath.  He has had no hemoptysis.  He denies any headaches or visual changes.   Current Outpatient Medications  Medication Sig Dispense Refill  . aspirin 81 MG tablet Take 81 mg by mouth daily.    Marland Kitchen atorvastatin (LIPITOR) 40 MG tablet Take 1 tablet (40 mg total) by mouth at bedtime. 90 tablet 3  . carvedilol (COREG) 3.125 MG tablet Take 2 tablets (6.25 mg total) by mouth 2 (two) times daily with a meal. 360 tablet 3  . losartan (COZAAR) 25 MG tablet Take 1 tablet (25 mg total) by mouth daily. 90 tablet 3  . sildenafil (VIAGRA) 50 MG tablet Take 1 tablet (50 mg total) by mouth daily as needed for erectile dysfunction. 10 tablet 5   No current facility-administered medications for this visit.     Diagnostic Tests:  Narrative & Impression  CLINICAL DATA:   Right-sided lung cancer with right VATS.  EXAM: CT CHEST WITHOUT CONTRAST  TECHNIQUE: Multidetector CT imaging of the chest was performed following the standard protocol without IV contrast.  COMPARISON:  02/19/2019 and 11/16/2016.  FINDINGS: Cardiovascular: Atherosclerotic calcification of the aorta with a small ulcerative plaque along the lateral transverse portion. Right and left pulmonary arteries are enlarged. Heart size normal. No pericardial effusion.  Mediastinum/Nodes: 10 mm low-attenuation left thyroid nodule. No follow-up recommended (ref: J Am Coll Radiol. 2015 Feb;12(2): 143-50).No pathologically enlarged mediastinal or axillary lymph nodes. Hilar regions are difficult to evaluate without IV contrast. Esophagus is grossly unremarkable.  Lungs/Pleura: Centrilobular and paraseptal emphysema. Right lower lobectomy. Smudgy perifissural nodules are unchanged and likely subpleural lymph nodes. Tiny right pleural effusion with associated pleural thickening, decreased in size. Pulmonary nodules in the posterior left lower lobe measure up to 9 mm (8/113), unchanged from 11/16/2016 and considered benign. No left pleural fluid. Airway is otherwise unremarkable.  Upper Abdomen: Visualized portions of the liver, adrenal glands, kidneys, spleen, pancreas, stomach and bowel are grossly unremarkable.  Musculoskeletal: Degenerative changes in the spine. Thoracotomy changes on the right. No worrisome lytic or sclerotic lesions.  IMPRESSION: 1. Right lower lobectomy without evidence of recurrent or metastatic disease. 2. Tiny chronic right fibrothorax, decreased in size. 3.  Aortic atherosclerosis (ICD10-I70.0). 4. Enlarged pulmonary arteries, indicative of pulmonary arterial hypertension. 5.  Emphysema (ICD10-J43.9).   Electronically Signed   By:  Lorin Picket M.D.   On: 08/13/2019 13:54       Impression:  Kelly Olson continues to do well 1 year  following right lower lobectomy for lung cancer resection.  CT scan of the chest shows no evidence of recurrent or metastatic disease.  I recommended that he have a follow-up CT scan of the chest in 6 months.  Plan:  He will return to see me in 6 months with a CT scan of the chest.    I discussed limitations of evaluation and management via telephone.  The patient was advised to call back for repeat telephone consultation or to seek an in-person evaluation if questions arise or the patient's clinical condition changes in any significant manner.    Gaye Pollack, MD 08/13/2019

## 2019-10-27 DIAGNOSIS — Z23 Encounter for immunization: Secondary | ICD-10-CM | POA: Diagnosis not present

## 2020-01-27 DIAGNOSIS — E7800 Pure hypercholesterolemia, unspecified: Secondary | ICD-10-CM | POA: Diagnosis not present

## 2020-01-27 DIAGNOSIS — G8929 Other chronic pain: Secondary | ICD-10-CM | POA: Diagnosis not present

## 2020-01-27 DIAGNOSIS — I2510 Atherosclerotic heart disease of native coronary artery without angina pectoris: Secondary | ICD-10-CM | POA: Diagnosis not present

## 2020-01-27 DIAGNOSIS — I25810 Atherosclerosis of coronary artery bypass graft(s) without angina pectoris: Secondary | ICD-10-CM | POA: Diagnosis not present

## 2020-01-27 DIAGNOSIS — Z85118 Personal history of other malignant neoplasm of bronchus and lung: Secondary | ICD-10-CM | POA: Diagnosis not present

## 2020-02-19 ENCOUNTER — Other Ambulatory Visit: Payer: Self-pay | Admitting: Surgery

## 2020-02-19 DIAGNOSIS — Z902 Acquired absence of lung [part of]: Secondary | ICD-10-CM

## 2020-02-19 DIAGNOSIS — C3491 Malignant neoplasm of unspecified part of right bronchus or lung: Secondary | ICD-10-CM

## 2020-03-17 ENCOUNTER — Ambulatory Visit (INDEPENDENT_AMBULATORY_CARE_PROVIDER_SITE_OTHER): Payer: Medicare Other | Admitting: Surgery

## 2020-03-17 ENCOUNTER — Encounter: Payer: Self-pay | Admitting: Surgery

## 2020-03-17 ENCOUNTER — Ambulatory Visit
Admission: RE | Admit: 2020-03-17 | Discharge: 2020-03-17 | Disposition: A | Discharge status: Home / Self Care | Payer: Medicare Other | Source: Ambulatory Visit | Attending: Surgery | Admitting: Surgery

## 2020-03-17 ENCOUNTER — Other Ambulatory Visit: Payer: Self-pay

## 2020-03-17 VITALS — BP 158/93 | HR 85 | Resp 20 | Wt 166.0 lb

## 2020-03-17 DIAGNOSIS — Z85118 Personal history of other malignant neoplasm of bronchus and lung: Secondary | ICD-10-CM | POA: Diagnosis not present

## 2020-03-17 DIAGNOSIS — Z902 Acquired absence of lung [part of]: Secondary | ICD-10-CM

## 2020-03-17 DIAGNOSIS — R918 Other nonspecific abnormal finding of lung field: Secondary | ICD-10-CM | POA: Diagnosis not present

## 2020-03-17 DIAGNOSIS — C3491 Malignant neoplasm of unspecified part of right bronchus or lung: Secondary | ICD-10-CM

## 2020-03-22 NOTE — Progress Notes (Signed)
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  He continues to remain active and exercise regularly.  He has been a non-smoker for many years. He denies any chest pain or shortness of breath.  He has had no hemoptysis.  He denies any headaches or visual changes.  He did receive the Covid vaccine and booster. Current Outpatient Medications  Medication Sig Dispense Refill  . aspirin 81 MG tablet Take 81 mg by mouth daily.    Marland Kitchen atorvastatin (LIPITOR) 40 MG tablet Take 1 tablet (40 mg total) by mouth at bedtime. 90 tablet 3  . carvedilol (COREG) 3.125 MG tablet Take 2 tablets (6.25 mg total) by mouth 2 (two) times daily with a meal. 360 tablet 3  . losartan (COZAAR) 25 MG tablet Take 1 tablet (25 mg total) by mouth daily. 90 tablet 3  . sildenafil (VIAGRA) 50 MG tablet Take 1 tablet (50 mg total) by mouth daily as needed for erectile dysfunction. 10 tablet 5   No current facility-administered medications for this visit.     Physical Exam: BP (!) 158/93 (BP Location: Left Arm, Patient Position: Sitting)   Pulse 85   Resp 20   Wt 166 lb (75.3 kg)   SpO2 93% Comment: RA  BMI 22.83 kg/m  He looks well. There is no cervical or supraclavicular adenopathy. Cardiac exam shows a regular rate and rhythm with normal heart sounds. Lung exam is clear. The right chest incisions look good.  Diagnostic Tests:  CLINICAL DATA:  Non-small cell right lung cancer status post right lower lobe wedge resection in 2018 with subsequent right lower lobectomy 07/29/2018 for recurrence. Former smoker. Restaging.  EXAM: CT CHEST WITHOUT CONTRAST  TECHNIQUE: Multidetector CT imaging of the chest was performed following the standard protocol without IV contrast.  COMPARISON:  08/13/2019 chest  CT.  FINDINGS: Cardiovascular: Normal heart size. No significant pericardial effusion/thickening. Three-vessel coronary atherosclerosis status post CABG. Atherosclerotic nonaneurysmal thoracic aorta. Normal caliber pulmonary arteries.  Mediastinum/Nodes: Subcentimeter hypodense bilateral thyroid nodules are stable. Not clinically significant; no follow-up imaging recommended (ref: J Am Coll Radiol. 2015 Feb;12(2): 143-50). Unremarkable esophagus. No pathologically enlarged axillary, mediastinal or hilar lymph nodes, noting limited sensitivity for the detection of hilar adenopathy on this noncontrast study.  Lungs/Pleura: No pneumothorax. Status post right lower lobectomy. Stable smooth mild basilar right pleural thickening with trace right pleural effusion. No left pleural effusion. Severe centrilobular emphysema. Stable triangular 1.3 cm subpleural right middle lobe nodular opacity along the minor fissure (series 8/image 92), probably benign. Stable 1.1 cm solid nodule along the minor fissure (series 8/image 99), probably benign. Anterior right middle lobe 1.0 cm solid nodular opacity (series 8/image 124), stable, probably benign. Several additional subcentimeter solid pulmonary nodules scattered in the lungs bilaterally, largest 0.9 cm in the peripheral left lower lobe (series 8/image 115), all stable. No acute consolidative airspace disease or new significant pulmonary nodules.  Upper abdomen: Colonic diverticulosis.  Musculoskeletal: No aggressive appearing focal osseous lesions. Intact sternotomy wires.  IMPRESSION: 1. No evidence of local tumor recurrence status post right lower lobectomy. 2. No new or progressive findings to suggest metastatic disease in the chest. Numerous bilateral pulmonary nodules are all stable and probably benign. 3. Aortic Atherosclerosis (ICD10-I70.0) and Emphysema (ICD10-J43.9).   Electronically Signed   By: Ilona Sorrel M.D.   On:  03/17/2020 14:10   Impression:  His chest CT shows no evidence of local tumor recurrence or metastatic disease.  There are numerous bilateral pulmonary nodules that all appears stable from his previous scan.  These are felt to be most likely benign but will require continued follow-up.  Plan:  He will return to see me in 6 months with a CT scan of the chest without contrast.  I spent 20 minutes performing this established patient evaluation and > 50% of this time was spent face to face counseling and coordinating the surveillance of his previously resected lung cancer and bilateral pulmonary nodules.    Gaye Pollack, MD [facility] [phone]

## 2020-04-13 ENCOUNTER — Other Ambulatory Visit: Payer: Self-pay | Admitting: Cardiology

## 2020-05-04 DIAGNOSIS — Z23 Encounter for immunization: Secondary | ICD-10-CM | POA: Diagnosis not present

## 2020-05-14 DIAGNOSIS — I25810 Atherosclerosis of coronary artery bypass graft(s) without angina pectoris: Secondary | ICD-10-CM | POA: Diagnosis not present

## 2020-05-14 DIAGNOSIS — Z85118 Personal history of other malignant neoplasm of bronchus and lung: Secondary | ICD-10-CM | POA: Diagnosis not present

## 2020-05-14 DIAGNOSIS — I2510 Atherosclerotic heart disease of native coronary artery without angina pectoris: Secondary | ICD-10-CM | POA: Diagnosis not present

## 2020-05-14 DIAGNOSIS — E7800 Pure hypercholesterolemia, unspecified: Secondary | ICD-10-CM | POA: Diagnosis not present

## 2020-05-14 DIAGNOSIS — G8929 Other chronic pain: Secondary | ICD-10-CM | POA: Diagnosis not present

## 2020-06-29 DIAGNOSIS — R7301 Impaired fasting glucose: Secondary | ICD-10-CM | POA: Diagnosis not present

## 2020-06-29 DIAGNOSIS — Z125 Encounter for screening for malignant neoplasm of prostate: Secondary | ICD-10-CM | POA: Diagnosis not present

## 2020-06-29 DIAGNOSIS — I2510 Atherosclerotic heart disease of native coronary artery without angina pectoris: Secondary | ICD-10-CM | POA: Diagnosis not present

## 2020-06-29 DIAGNOSIS — H05243 Constant exophthalmos, bilateral: Secondary | ICD-10-CM | POA: Diagnosis not present

## 2020-06-29 DIAGNOSIS — Z85118 Personal history of other malignant neoplasm of bronchus and lung: Secondary | ICD-10-CM | POA: Diagnosis not present

## 2020-06-29 DIAGNOSIS — E7800 Pure hypercholesterolemia, unspecified: Secondary | ICD-10-CM | POA: Diagnosis not present

## 2020-06-29 DIAGNOSIS — I700 Atherosclerosis of aorta: Secondary | ICD-10-CM | POA: Diagnosis not present

## 2020-06-29 DIAGNOSIS — Z1389 Encounter for screening for other disorder: Secondary | ICD-10-CM | POA: Diagnosis not present

## 2020-06-29 DIAGNOSIS — Z0000 Encounter for general adult medical examination without abnormal findings: Secondary | ICD-10-CM | POA: Diagnosis not present

## 2020-07-26 ENCOUNTER — Ambulatory Visit (INDEPENDENT_AMBULATORY_CARE_PROVIDER_SITE_OTHER): Payer: Medicare Other | Admitting: Cardiology

## 2020-07-26 ENCOUNTER — Encounter: Payer: Self-pay | Admitting: Cardiology

## 2020-07-26 ENCOUNTER — Other Ambulatory Visit: Payer: Self-pay

## 2020-07-26 VITALS — BP 150/84 | HR 61 | Ht 71.5 in | Wt 162.0 lb

## 2020-07-26 DIAGNOSIS — Z951 Presence of aortocoronary bypass graft: Secondary | ICD-10-CM | POA: Diagnosis not present

## 2020-07-26 DIAGNOSIS — E785 Hyperlipidemia, unspecified: Secondary | ICD-10-CM

## 2020-07-26 DIAGNOSIS — R030 Elevated blood-pressure reading, without diagnosis of hypertension: Secondary | ICD-10-CM | POA: Diagnosis not present

## 2020-07-26 DIAGNOSIS — Z8679 Personal history of other diseases of the circulatory system: Secondary | ICD-10-CM | POA: Diagnosis not present

## 2020-07-26 DIAGNOSIS — I255 Ischemic cardiomyopathy: Secondary | ICD-10-CM

## 2020-07-26 DIAGNOSIS — Z87891 Personal history of nicotine dependence: Secondary | ICD-10-CM

## 2020-07-26 DIAGNOSIS — I2510 Atherosclerotic heart disease of native coronary artery without angina pectoris: Secondary | ICD-10-CM

## 2020-07-26 NOTE — Patient Instructions (Signed)
Medication Instructions:  No changes  *If you need a refill on your cardiac medications before your next appointment, please call your pharmacy*   Lab Work:  Not needed   Testing/Procedures:  Not needed  Follow-Up: At [facility], you and your health needs are our priority.  As part of our continuing mission to provide you with exceptional heart care, we have created designated Provider Care Teams.  These Care Teams include your primary Cardiologist (physician) and Advanced Practice Providers (APPs -  Physician Assistants and Nurse Practitioners) who all work together to provide you with the care you need, when you need it.     Your next appointment:   12 month(s)  The format for your next appointment:   In Person  Provider:   Glenetta Hew, MD   Other Instructions   Keep eye on blood pressure  goal to keep blood pressure less than 130/80

## 2020-07-26 NOTE — Progress Notes (Signed)
Primary Care Provider: [name], MD Cardiologist: Glenetta Hew, MD Electrophysiologist: None  Clinic Note: Chief Complaint  Patient presents with   Follow-up    Annual   Coronary Artery Disease    No angina    ===================================  ASSESSMENT/PLAN   Problem List Items Addressed This Visit     History of class III angina pectoris (Chronic)    Is initial presenting symptoms were pretty classic class III anginal symptoms that led to the Myoview and then cardiac catheterization.  Since his CABG, he has not had any further angina.  He is on a very stable regimen.  Only on low-dose ARB and beta-blocker.  As long as symptoms well controlled, he continues to be very active with exercising, trying to continue the gains that he obtained initially with cardiac rehab.  He is very conscious of the symptoms he had and continues to monitor.       Coronary artery disease involving native coronary artery without angina pectoris (Chronic)    He is very active now with no anginal symptoms.  6 years out from CABG with no further angina, Nonischemic Myoview 2 years ago.  Plan: Continue standard treatment  low-dose aspirin (which can be held 5 to 7 days preop for surgery procedures On stable low-dose carvedilol and losartan.  If blood pressure tends to trend up, probably need to increase losartan to 50 mg. Continue current dose of atorvastatin           Relevant Orders   EKG 12-Lead (Completed)   Hx of CABG (Chronic)    He is now 6 years out from CABG.  At the 4-year point, we checked a Myoview that was nonischemic.  Would be due for another follow-up Myoview in 2024 unless symptoms warrant.  Thankfully, postop his EF notably improved with revascularization and he is not having any angina or heart failure symptoms.       Relevant Orders   EKG 12-Lead (Completed)   Elevated blood pressure reading    His pressure is little high today which is unusual.  He tells  me at home his pressures are much better.  Easily continue to monitor his pressures at home and let us know if they go up.  Would have a low threshold to gradually increase his ARB dose.  Target blood pressures less than 130/80.       Former heavy tobacco smoker - quit when he had diagnoses of CAD (Chronic)    He quit smoking at the time of his cardiac catheterization even prior to CABG, and he never looked back.  Now with having CAD having CABG as well as lung cancer resection x2, he is very happy that he is no longer smoking.       Hyperlipidemia with target LDL less than 70 - Primary (Chronic)    Lipids continue to be outstanding.  LDL now 54.  This is despite having reduced atorvastatin to 40 mg daily.  Tolerating well with no myalgias.  We tried before to reduce to 20 mg, and his lipids went up, we therefore stay on 40 mg daily.  Continue with diet and exercise.  Labs are followed by PCP.       Cardiomyopathy, ischemic: EF ~35-45% by LV Gram --> 50 and 55% by echo (Chronic)    Notable improvement post CABG revascularization with EF now in the low normal range of 50 to 55% with no active heart failure symptoms.  Remains on low-dose carvedilol and losartan  with no diuretic requirement.  No angina or heart failure symptoms.        ===================================  HPI:    Kelly Olson. is a 70 y.o. male with a PMH as noted below who presents today for ~ annual follow-up.  DIAGNOSED WITH MV CAD IN MAY 2016 Initial presented with SSx c/w Class II-III Angina --> (had previously been walking several miles daily) High Risk Myoview (April 2016) -> Cath Cath with MV CAD (100% LAD, 99% mRCA-100% dRCA & 90% p-m Cx) w/ EF 35-45% --> CABG CABG: LIMA-OM, RIMA-LAD, SVG-Diag, SVG-rPDA (Dr. Cyndia Bent) Repeat Echo: EF 50-55% Myoview 03/2018 - No Ischemia or Infarct 50-55% - LOW RISK 07/10/16: s/p R thoracotomy for RLL nodule wedge resection -- Squamous Cell CA.(Dr. Cyndia Bent) -  negative margins. - complicated by air leak (Sub Cu emphysema - redo chest tube x 2) 07/29/2018: Right Lower Lobectomy via Right Thoracotomy (Dr. Cyndia Bent for + Pet CT showing recurrent SS Lung Ca (Stage T2a, N0)  --> not complicated by Sub Cu emphysema this time.  Kelly Olson. was last seen on August 08, 2019.  He is doing remarkably well without any major issues.  He had recovered from his most recent surgery and was up to walking 3-1/2-4 miles a day 5 days a week at a brisk pace.  He noted being less short of breath with activity than he had been before.  Only noted getting short of breath if he tries to get going too fast too soon.  Also playing 18 holes of golf 2 or 3 days a week.  He noted that he is paying close text to his dietary intake.  Recent Hospitalizations: None  Reviewed  CV studies:    The following studies were reviewed today: (if available, images/films reviewed: From Epic Chart or Care Everywhere) None:   Interval History:   Kelly Olson. -"Kelly Olson" returns for annual follow-up overall doing wonderfully.  He says he joined the new [employer] at [place], and is back to doing his routine exercise.  He does try to get there at least 3 if not 4 days a week.  He also does some golfing although he admits that he out there only for fun and sometimes it takes a long time to finish 18 holes.   He is doing remarkably well.  He has not had any active angina or CHF symptoms.  He may notice a few skipped beats off and on, but nothing that has been bothersome.  He says that he has had a few high blood pressure readings at doctor's offices, but at home his pressures range anywhere from 120/70 to 130/75 mmHg.  He is a little bit taken aback by current blood pressure today, but feels fine.  He says usually his STEMI is going on, he is able to notice something going on with symptoms..  CV Review of Symptoms (Summary) Cardiovascular ROS: negative for - chest pain,  dyspnea on exertion, edema, irregular heartbeat, orthopnea, palpitations, paroxysmal nocturnal dyspnea, rapid heart rate, shortness of breath, or lightheadedness or dizziness or wooziness, syncope/near syncope or TIA/amaurosis fugax, claudication  REVIEWED OF SYSTEMS   Review of Systems  Constitutional:  Negative for malaise/fatigue.  HENT:  Positive for congestion (Only when allergies act up). Negative for nosebleeds.   Respiratory:  Negative for cough (He loves no longer having a smoker's cough), shortness of breath and wheezing.   Gastrointestinal:  Negative for abdominal pain, blood in stool and melena.  Genitourinary:  Negative for hematuria.  Musculoskeletal:  Positive for joint pain. Negative for falls.       Mild arthritis pains, but nothing limiting  Neurological:  Negative for dizziness, focal weakness and weakness.  Psychiatric/Behavioral: Negative.     I have reviewed and (if needed) personally updated the patient's problem list, medications, allergies, past medical and surgical history, social and family history.   PAST MEDICAL HISTORY   Past Medical History:  Diagnosis Date   Coronary artery disease, occlusive 06/02/2014   Multivessel CAD.mLAD-100%, mRCA 99%, dRCA 100%, mLCX 90%  And EF 35-45%. -->  Referred for CABG; nonischemic Myoview March 2020   Former heavy tobacco smoker    Quit in April 2016    Hyperlipidemia with target LDL less than 70    Ischemic cardiomyopathy - resolved 05/2014   Myoview: EF ~33% with "infarction vs. severe resting ischemia in LAD & RCA territory; b) EF by Cath: 35-45%. 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   Stage T2 a, N0    PAST SURGICAL HISTORY   Past Surgical  History:  Procedure Laterality Date   CARDIAC CATHETERIZATION N/A 06/02/2014   Procedure: Left Heart Cath and Coronary Angiography;  Surgeon: Leonie Man, MD;  Location: [facility] INVASIVE CV LAB CUPID;  Service: Cardiovascular;  mLAD-100%, mRCA 99%, dRCA 100%, mLCX 90%  And EF 35-45%.   CORONARY ARTERY BYPASS GRAFT N/A 06/04/2014   Procedure: CORONARY ARTERY BYPASS GRAFTING (CABG) times four using bilateral internal mammary arteries and EVH for left  leg saphenous vein;  Surgeon: Gaye Pollack, MD;  Location: [facility] OR;  Service: Open Heart Surgery;  LIMA-OM, RIMA-LAD, SVG-Diag, SVG-rPDA   NM MYOVIEW LTD  05/28/2014   Pre-CABG:  High Risk Nuclear Stress Test with multivessel distribution ischemia.  -- Severely ischemic Cardiomyoapthy with evidence of at least 2 vessel disease & EF of ~33%.  Images are consistent with either infarction or severe resting ischemia in the LAD & RCA territory.   NM MYOVIEW LTD  03/2018   EF 50-55%.  Small basal inferior fixed defect consistent with diaphragmatic attenuation.  No ischemia or infarction.  Septal dyskinesis due to post CABG state   TEE WITHOUT CARDIOVERSION N/A 06/04/2014   Procedure: TRANSESOPHAGEAL ECHOCARDIOGRAM (TEE);  Surgeon: Gaye Pollack, MD;  Location: [place];  Service: Open Heart Surgery;  Laterality: N/A;   THORACOTOMY Right 07/10/2016   Procedure: THORACOTOMY;  Surgeon: Gaye Pollack, MD;  Location[facility] OR;  Service: Thoracic;  Laterality: Right;   THORACOTOMY/LOBECTOMY Right 07/29/2018   Procedure: THORACOTOMY/RIGHT LOWER LOBECTOMY;  Surgeon: Gaye Pollack, MD;  Location: [facility] OR;  Service: Thoracic;  Laterality: Right;   [place] ECHOCARDIOGRAM  10/2014   Mild concentric LVH. EF 50-55% with mild HK of basal anteroseptal myocardium. GR 1 DD.   VIDEO ASSISTED THORACOSCOPY (VATS)/WEDGE RESECTION Right 07/10/2016   Procedure: VIDEO ASSISTED THORACOSCOPY (VATS)/WEDGE RESECTION;  Surgeon: Gaye Pollack, MD;  Location: [facility] OR;   Service: Thoracic;  Laterality: Right;    Immunization History  Administered Date(s) Administered   PFIZER(Purple Top)SARS-COV-2 Vaccination 03/06/2019, 04/01/2019    MEDICATIONS/ALLERGIES   Current Meds  Medication Sig  aspirin 81 MG tablet Take 81 mg by mouth daily.   atorvastatin (LIPITOR) 40 MG tablet TAKE 1 TABLET AT BEDTIME   carvedilol (COREG) 3.125 MG tablet TAKE 2 TABLETS (6.25 MG    TOTAL) TWICE A DAY WITH    MEALS   losartan (COZAAR) 25 MG tablet TAKE 1 TABLET DAILY   sildenafil (VIAGRA) 50 MG tablet Take 1 tablet (50 mg total) by mouth daily as needed for erectile dysfunction.    No Known Allergies  SOCIAL HISTORY/FAMILY HISTORY   Reviewed in Epic:  Pertinent findings:  Social History   Tobacco Use   Smoking status: Former    Packs/day: 0.80    Years: 46.00    Pack years: 36.80    Types: Cigarettes    Quit date: 05/29/2014    Years since quitting: 6.1   Smokeless tobacco: Never  Vaping Use   Vaping Use: Never used  Substance Use Topics   Alcohol use: Yes    Alcohol/week: 7.0 standard drinks    Types: 7 Glasses of wine per week   Drug use: No   Social History   Social History Narrative   Wife recently diagnosed with early stage breast cancer    OBJCTIVE -PE, EKG, labs   Wt Readings from Last 3 Encounters:  07/26/20 162 lb (73.5 kg)  03/17/20 166 lb (75.3 kg)  08/08/19 162 lb 3.2 oz (73.6 kg)    Physical Exam: BP (!) 150/84 (BP Location: Left Arm, Patient Position: Sitting, Cuff Size: Normal)   Pulse 61   Ht 5' 11.5" (1.816 m)   Wt 162 lb (73.5 kg)   SpO2 98%   BMI 22.28 kg/m  Physical Exam Constitutional:      General: He is not in acute distress.    Appearance: Normal appearance. He is normal weight. He is ill-appearing. He is not toxic-appearing.  HENT:     Head: Normocephalic and atraumatic.  Eyes:     Extraocular Movements: Extraocular movements intact.     Pupils: Pupils are equal, round, and reactive to light.     Comments:  Mild exophthalmos  Neck:     Vascular: No carotid bruit or JVD.  Cardiovascular:     Rate and Rhythm: Normal rate and regular rhythm. No extrasystoles are present.    Chest Wall: PMI is not displaced.     Pulses: Normal pulses.     Heart sounds: Normal heart sounds. No murmur heard.   No friction rub. No gallop.     Comments: Well-healed sternotomy and thoracotomy scars.. Pulmonary:     Effort: Pulmonary effort is normal.     Breath sounds: Normal breath sounds. No wheezing, rhonchi or rales.  Chest:     Chest wall: No tenderness.  Musculoskeletal:        General: No swelling. Normal range of motion.     Cervical back: Normal range of motion and neck supple.  Skin:    General: Skin is warm and dry.  Neurological:     General: No focal deficit present.     Mental Status: He is alert and oriented to person, place, and time.  Psychiatric:        Mood and Affect: Mood normal.        Behavior: Behavior normal.        Thought Content: Thought content normal.        Judgment: Judgment normal.     Adult ECG Report  Rate: 61 ;  Rhythm: normal sinus rhythm and  left atrial normality, incomplete right bundle branch block. ; Normal axis, intervals and durations.  Narrative Interpretation: Stable  Recent Labs:   06/29/2020: TC 126, TG 49, HDL 60, LDL 54.  A1c 6.0.  BUN/Cr 25/1.0, K+ 4.6, TSH 1.5.   Lab Results  Component Value Date   CHOL 140 04/08/2018   HDL 62 04/08/2018   LDLCALC 65 04/08/2018   TRIG 64 04/08/2018   CHOLHDL 2.3 04/08/2018   Lab Results  Component Value Date   CREATININE 0.78 07/30/2018   BUN 17 07/30/2018   NA 138 07/30/2018   K 4.5 07/30/2018   CL 103 07/30/2018   CO2 27 07/30/2018   CBC Latest Ref Rng & Units 07/30/2018 07/24/2018 07/12/2016  WBC 4.0 - 10.5 K/uL 15.0(H) 7.6 14.8(H)  Hemoglobin 13.0 - 17.0 g/dL 14.3 15.9 14.1  Hematocrit 39.0 - 52.0 % 43.4 47.4 42.8  Platelets 150 - 400 K/uL 261 253 239    No results found for:  TSH  ==================================================  COVID-19 Education: The signs and symptoms of COVID-19 were discussed with the patient and how to seek care for testing (follow up with PCP or arrange E-visit).    I spent a total of 73minutes with the patient spent in direct patient consultation.  Additional time spent with chart review  / charting (studies, outside notes, etc): 10 min Total Time: 33 min  Current medicines are reviewed at length with the patient today.  (+/- concerns) n/a  This visit occurred during the SARS-CoV-2 public health emergency.  Safety protocols were in place, including screening questions prior to the visit, additional usage of staff PPE, and extensive cleaning of exam room while observing appropriate contact time as indicated for disinfecting solutions.  Notice: This dictation was prepared with Dragon dictation along with smaller phrase technology. Any transcriptional errors that result from this process are unintentional and may not be corrected upon review.  Patient Instructions / Medication Changes & Studies & Tests Ordered   Patient Instructions  Medication Instructions:  No changes  *If you need a refill on your cardiac medications before your next appointment, please call your pharmacy*   Lab Work:  Not needed   Testing/Procedures:  Not needed  Follow-Up: At [facility], you and your health needs are our priority.  As part of our continuing mission to provide you with exceptional heart care, we have created designated Provider Care Teams.  These Care Teams include your primary Cardiologist (physician) and Advanced Practice Providers (APPs -  Physician Assistants and Nurse Practitioners) who all work together to provide you with the care you need, when you need it.     Your next appointment:   12 month(s)  The format for your next appointment:   In Person  Provider:   Glenetta Hew, MD   Other Instructions   Keep eye on  blood pressure  goal to keep blood pressure less than 130/80   Studies Ordered:   Orders Placed This Encounter  Procedures   EKG 12-Lead     Glenetta Hew, M.D., M.S. Interventional Cardiologist   Pager # [phone] Phone # [phone] [address] [place], [place] [address]   Thank you for choosing Heartcare at [facility]!!

## 2020-07-27 ENCOUNTER — Encounter: Payer: Self-pay | Admitting: Cardiology

## 2020-07-27 NOTE — Assessment & Plan Note (Addendum)
He is very active now with no anginal symptoms.  6 years out from CABG with no further angina, Nonischemic Myoview 2 years ago.  Plan: Continue standard treatment   low-dose aspirin (which can be held 5 to 7 days preop for surgery procedures  On stable low-dose carvedilol and losartan.  If blood pressure tends to trend up, probably need to increase losartan to 50 mg.  Continue current dose of atorvastatin

## 2020-07-27 NOTE — Assessment & Plan Note (Signed)
He quit smoking at the time of his cardiac catheterization even prior to CABG, and he never looked back.  Now with having CAD having CABG as well as lung cancer resection x2, he is very happy that he is no longer smoking.

## 2020-07-27 NOTE — Assessment & Plan Note (Signed)
Lipids continue to be outstanding.  LDL now 54.  This is despite having reduced atorvastatin to 40 mg daily.  Tolerating well with no myalgias.  We tried before to reduce to 20 mg, and his lipids went up, we therefore stay on 40 mg daily.  Continue with diet and exercise.  Labs are followed by PCP.

## 2020-07-27 NOTE — Assessment & Plan Note (Signed)
Notable improvement post CABG revascularization with EF now in the low normal range of 50 to 55% with no active heart failure symptoms.  Remains on low-dose carvedilol and losartan with no diuretic requirement.  No angina or heart failure symptoms.

## 2020-07-27 NOTE — Assessment & Plan Note (Signed)
Is initial presenting symptoms were pretty classic class III anginal symptoms that led to the Myoview and then cardiac catheterization.  Since his CABG, he has not had any further angina.  He is on a very stable regimen.  Only on low-dose ARB and beta-blocker.  As long as symptoms well controlled, he continues to be very active with exercising, trying to continue the gains that he obtained initially with cardiac rehab.  He is very conscious of the symptoms he had and continues to monitor.

## 2020-07-27 NOTE — Assessment & Plan Note (Signed)
His pressure is little high today which is unusual.  He tells me at home his pressures are much better.  Easily continue to monitor his pressures at home and let us know if they go up.  Would have a low threshold to gradually increase his ARB dose.  Target blood pressures less than 130/80.

## 2020-07-27 NOTE — Assessment & Plan Note (Signed)
He is now 6 years out from CABG.  At the 4-year point, we checked a Myoview that was nonischemic.  Would be due for another follow-up Myoview in 2024 unless symptoms warrant.  Thankfully, postop his EF notably improved with revascularization and he is not having any angina or heart failure symptoms.

## 2020-08-04 DIAGNOSIS — Z20822 Contact with and (suspected) exposure to covid-19: Secondary | ICD-10-CM | POA: Diagnosis not present

## 2020-08-10 ENCOUNTER — Other Ambulatory Visit: Payer: Self-pay | Admitting: Surgery

## 2020-08-10 DIAGNOSIS — C399 Malignant neoplasm of lower respiratory tract, part unspecified: Secondary | ICD-10-CM

## 2020-08-10 DIAGNOSIS — Z902 Acquired absence of lung [part of]: Secondary | ICD-10-CM

## 2020-09-14 DIAGNOSIS — I25810 Atherosclerosis of coronary artery bypass graft(s) without angina pectoris: Secondary | ICD-10-CM | POA: Diagnosis not present

## 2020-09-14 DIAGNOSIS — Z85118 Personal history of other malignant neoplasm of bronchus and lung: Secondary | ICD-10-CM | POA: Diagnosis not present

## 2020-09-14 DIAGNOSIS — G8929 Other chronic pain: Secondary | ICD-10-CM | POA: Diagnosis not present

## 2020-09-14 DIAGNOSIS — I2510 Atherosclerotic heart disease of native coronary artery without angina pectoris: Secondary | ICD-10-CM | POA: Diagnosis not present

## 2020-09-14 DIAGNOSIS — E7800 Pure hypercholesterolemia, unspecified: Secondary | ICD-10-CM | POA: Diagnosis not present

## 2020-09-17 ENCOUNTER — Ambulatory Visit: Payer: Medicare Other | Admitting: Thoracic Surgery (Cardiothoracic Vascular Surgery)

## 2020-09-27 ENCOUNTER — Ambulatory Visit
Admission: RE | Admit: 2020-09-27 | Discharge: 2020-09-27 | Disposition: A | Discharge status: Home / Self Care | Payer: Medicare Other | Source: Ambulatory Visit | Attending: Surgery | Admitting: Surgery

## 2020-09-27 DIAGNOSIS — I700 Atherosclerosis of aorta: Secondary | ICD-10-CM | POA: Diagnosis not present

## 2020-09-27 DIAGNOSIS — J479 Bronchiectasis, uncomplicated: Secondary | ICD-10-CM | POA: Diagnosis not present

## 2020-09-27 DIAGNOSIS — C399 Malignant neoplasm of lower respiratory tract, part unspecified: Secondary | ICD-10-CM

## 2020-09-27 DIAGNOSIS — R911 Solitary pulmonary nodule: Secondary | ICD-10-CM | POA: Diagnosis not present

## 2020-09-27 DIAGNOSIS — R918 Other nonspecific abnormal finding of lung field: Secondary | ICD-10-CM | POA: Diagnosis not present

## 2020-09-27 DIAGNOSIS — Z902 Acquired absence of lung [part of]: Secondary | ICD-10-CM

## 2020-09-27 DIAGNOSIS — J189 Pneumonia, unspecified organism: Secondary | ICD-10-CM | POA: Diagnosis not present

## 2020-09-27 DIAGNOSIS — J439 Emphysema, unspecified: Secondary | ICD-10-CM | POA: Diagnosis not present

## 2020-09-29 ENCOUNTER — Other Ambulatory Visit: Payer: Self-pay

## 2020-09-29 ENCOUNTER — Ambulatory Visit (INDEPENDENT_AMBULATORY_CARE_PROVIDER_SITE_OTHER): Payer: Medicare Other | Admitting: Surgery

## 2020-09-29 ENCOUNTER — Encounter: Payer: Self-pay | Admitting: Surgery

## 2020-09-29 VITALS — BP 136/79 | HR 74 | Resp 20 | Ht 71.5 in | Wt 162.0 lb

## 2020-09-29 DIAGNOSIS — I255 Ischemic cardiomyopathy: Secondary | ICD-10-CM

## 2020-09-29 DIAGNOSIS — Z85118 Personal history of other malignant neoplasm of bronchus and lung: Secondary | ICD-10-CM

## 2020-09-29 DIAGNOSIS — H31001 Unspecified chorioretinal scars, right eye: Secondary | ICD-10-CM | POA: Diagnosis not present

## 2020-10-01 NOTE — Progress Notes (Signed)
HPI:  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  His last CT scan of the chest in February 2022 showed numerous bilateral pulmonary nodules that all appeared stable compared to his previous scan and were felt to be most likely benign. He continues to remain active and exercise regularly.  He has been a non-smoker for many years. He denies any chest pain or shortness of breath.  He has had no hemoptysis.  He denies any headaches or visual changes.   Current Outpatient Medications  Medication Sig Dispense Refill   aspirin 81 MG tablet Take 81 mg by mouth daily.     atorvastatin (LIPITOR) 40 MG tablet TAKE 1 TABLET AT BEDTIME 90 tablet 3   carvedilol (COREG) 3.125 MG tablet TAKE 2 TABLETS (6.25 MG    TOTAL) TWICE A DAY WITH    MEALS 360 tablet 3   losartan (COZAAR) 25 MG tablet TAKE 1 TABLET DAILY 90 tablet 3   sildenafil (VIAGRA) 50 MG tablet Take 1 tablet (50 mg total) by mouth daily as needed for erectile dysfunction. 10 tablet 5   No current facility-administered medications for this visit.     Physical Exam: BP 136/79   Pulse 74   Resp 20   Ht 5' 11.5" (1.816 m)   Wt 162 lb (73.5 kg)   SpO2 96% Comment: RA  BMI 22.28 kg/m  He looks well. There is no cervical or supraclavicular adenopathy. Cardiac exam shows a regular rate and rhythm with normal heart sounds. Lung exam is clear.   Diagnostic Tests:  Narrative & Impression  CLINICAL DATA:  70 year old male with history of non-small cell lung cancer status post right lower lobectomy. Follow-up study.   EXAM: CT CHEST WITHOUT CONTRAST   TECHNIQUE: Multidetector CT imaging of the chest was performed following the standard protocol without IV contrast.   COMPARISON:  Chest CT 03/17/2020.   FINDINGS: Cardiovascular: Heart size is normal.  There is no significant pericardial fluid, thickening or pericardial calcification. There is aortic atherosclerosis, as well as atherosclerosis of the great vessels of the mediastinum and the coronary arteries, including calcified atherosclerotic plaque in the left main, left anterior descending, left circumflex and right coronary arteries. Status post median sternotomy for CABG including LIMA.   Mediastinum/Nodes: No pathologically enlarged mediastinal or hilar lymph nodes. Please note that accurate exclusion of hilar adenopathy is limited on noncontrast CT scans. Esophagus is unremarkable in appearance. No axillary lymphadenopathy.   Lungs/Pleura: Status post right lower lobectomy. Compensatory hyperexpansion of the right middle and upper lobes. Stable areas of subpleural nodularity in the medial aspect of the right upper lobe associated with the minor fissure (axial image 87 of series 8), along the minor fissure in the inferior right upper lobe (axial image 98 of series 8), in the anterior aspect of the right middle lobe (axial image 121 of series 8), and in the periphery of the left lower lobe (axial image 124 of series 8), all likely benign. Several other smaller 2-4 mm pulmonary nodules are also stable. No definite new suspicious appearing pulmonary nodules or masses are noted. No acute consolidative airspace disease. No pleural effusions. Chronic right pleural thickening and trace amount of pleural calcifications. No pleural effusions. Diffuse bronchial wall thickening with moderate to severe centrilobular and paraseptal emphysema. There also some patchy areas of cylindrical bronchiectasis, peripheral bronchiolectasis  and what appear to be developing regions of honeycombing in the lung bases (left greater than right), concerning for interstitial lung disease.   Upper Abdomen: Aortic atherosclerosis.   Musculoskeletal: Median sternotomy wires. There are no  aggressive appearing lytic or blastic lesions noted in the visualized portions of the skeleton.   IMPRESSION: 1. Multiple small pulmonary nodules, stable compared to the prior study, favored to be benign. No definitive findings of metastatic disease in this patient status post right lower lobectomy. 2. Diffuse bronchial wall thickening with moderate to severe centrilobular and paraseptal emphysema; imaging findings suggestive of underlying COPD. 3. There is also evidence of developing interstitial lung disease in the lung bases, with imaging findings considered probable usual interstitial pneumonia (UIP) per current ats guidelines. Follow-up outpatient referral to pulmonology for further clinical evaluation is suggested if clinically appropriate. 4. Aortic atherosclerosis, in addition to left main and 3 vessel coronary artery disease. Status post CABG including LIMA.   Aortic Atherosclerosis (ICD10-I70.0) and Emphysema (ICD10-J43.9).     Electronically Signed   By: Vinnie Langton M.D.   On: 09/27/2020 08:47      Impression:  [name] continues to do well without signs of local tumor recurrence or metastatic disease on recent chest CT scan.  There are numerous bilateral pulmonary nodules that all appears stable from previous scans and felt to be benign.  There are some interstitial lung disease in the bases that radiology felt look like UIP but review of his previous scan shows that this has been present and stable for several years.  He is asymptomatic and I do not think this is clinically significant.  I reviewed the CT images with the patient and his wife and answered their questions.  I recommended doing a chest x-ray in 6 months and then I will see him back in 1 year with a CT scan of the chest for lung cancer surveillance.  Plan:  He will return to see me in 6 months with a chest x-ray and then at 1 year with a CT scan of the chest.  I spent 15 minutes performing this  established patient evaluation and > 50% of this time was spent face to face counseling and coordinating the surveillance of his previously resected lung cancer and bilateral pulmonary nodules.   Gaye Pollack, MD [facility] [phone]

## 2020-10-11 DIAGNOSIS — Z23 Encounter for immunization: Secondary | ICD-10-CM | POA: Diagnosis not present

## 2020-12-16 DIAGNOSIS — Z20828 Contact with and (suspected) exposure to other viral communicable diseases: Secondary | ICD-10-CM | POA: Diagnosis not present

## 2021-02-01 DIAGNOSIS — U071 COVID-19: Secondary | ICD-10-CM | POA: Diagnosis not present

## 2021-02-11 ENCOUNTER — Other Ambulatory Visit: Payer: Self-pay | Admitting: Surgery

## 2021-02-11 DIAGNOSIS — C3490 Malignant neoplasm of unspecified part of unspecified bronchus or lung: Secondary | ICD-10-CM

## 2021-03-11 DIAGNOSIS — Z20822 Contact with and (suspected) exposure to covid-19: Secondary | ICD-10-CM | POA: Diagnosis not present

## 2021-03-18 DIAGNOSIS — Z20822 Contact with and (suspected) exposure to covid-19: Secondary | ICD-10-CM | POA: Diagnosis not present

## 2021-03-23 ENCOUNTER — Other Ambulatory Visit: Payer: Self-pay

## 2021-03-23 ENCOUNTER — Ambulatory Visit: Payer: Medicare Other | Admitting: Thoracic Surgery (Cardiothoracic Vascular Surgery)

## 2021-03-23 ENCOUNTER — Ambulatory Visit (INDEPENDENT_AMBULATORY_CARE_PROVIDER_SITE_OTHER): Payer: Medicare Other | Admitting: Surgery

## 2021-03-23 ENCOUNTER — Other Ambulatory Visit: Payer: Self-pay | Admitting: *Deleted

## 2021-03-23 ENCOUNTER — Ambulatory Visit
Admission: RE | Admit: 2021-03-23 | Discharge: 2021-03-23 | Disposition: A | Discharge status: Home / Self Care | Payer: Medicare Other | Source: Ambulatory Visit | Attending: Surgery | Admitting: Surgery

## 2021-03-23 VITALS — BP 146/78 | HR 81 | Resp 20 | Ht 71.5 in | Wt 167.0 lb

## 2021-03-23 DIAGNOSIS — C3490 Malignant neoplasm of unspecified part of unspecified bronchus or lung: Secondary | ICD-10-CM

## 2021-03-23 DIAGNOSIS — R911 Solitary pulmonary nodule: Secondary | ICD-10-CM | POA: Diagnosis not present

## 2021-03-23 DIAGNOSIS — J439 Emphysema, unspecified: Secondary | ICD-10-CM | POA: Diagnosis not present

## 2021-03-23 DIAGNOSIS — Z85118 Personal history of other malignant neoplasm of bronchus and lung: Secondary | ICD-10-CM | POA: Diagnosis not present

## 2021-03-23 DIAGNOSIS — R918 Other nonspecific abnormal finding of lung field: Secondary | ICD-10-CM | POA: Diagnosis not present

## 2021-03-23 DIAGNOSIS — Z902 Acquired absence of lung [part of]: Secondary | ICD-10-CM

## 2021-03-23 NOTE — Progress Notes (Signed)
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  A CT scan of the chest in February 2022 showed numerous bilateral pulmonary nodules that all appeared stable compared to his previous scan and were felt to be most likely benign.  I last saw him on 09/29/2020 and his CT scan of the chest at that time showed multiple small pulmonary nodules that appeared stable compared to the prior study and were favored to be benign.  There was evidence of some developing interstitial lung disease in the lung bases.  He has continued to remain active and gets regular exercise.  He has been a non-smoker for many years.  He reports having COVID at the end of January with some upper respiratory congestion.  Current Outpatient Medications  Medication Sig Dispense Refill   aspirin 81 MG tablet Take 81 mg by mouth daily.     atorvastatin (LIPITOR) 40 MG tablet TAKE 1 TABLET AT BEDTIME 90 tablet 3   carvedilol (COREG) 3.125 MG tablet TAKE 2 TABLETS (6.25 MG    TOTAL) TWICE A DAY WITH    MEALS 360 tablet 3   losartan (COZAAR) 25 MG tablet TAKE 1 TABLET DAILY 90 tablet 3   sildenafil (VIAGRA) 50 MG tablet Take 1 tablet (50 mg total) by mouth daily as needed for erectile dysfunction. 10 tablet 5   No current facility-administered medications for this visit.     Physical Exam: BP (!) 146/78 (BP Location: Right Arm, Patient Position: Sitting)    Pulse 81    Resp 20    Ht 5' 11.5" (1.816 m)    Wt 167 lb (75.8 kg)    SpO2 92% Comment: RA   BMI 22.97 kg/m  He looks well. There is no cervical or supraclavicular adenopathy. Cardiac exam shows regular rate and rhythm with normal heart sounds. Lungs are clear.  Diagnostic Tests:  Narrative & Impression  CLINICAL DATA:  Non-small-cell lung cancer, status post right lower lobectomy.  Ex-smoker.   EXAM: CT CHEST WITHOUT CONTRAST   TECHNIQUE: Multidetector CT imaging of the chest was performed following the standard protocol without IV contrast.   RADIATION DOSE REDUCTION: This exam was performed according to the departmental dose-optimization program which includes automated exposure control, adjustment of the mA and/or kV according to patient size and/or use of iterative reconstruction technique.   COMPARISON:  09/27/2020   FINDINGS: Cardiovascular: Bovine arch. Normal heart size, without pericardial effusion. Median sternotomy for prior CABG.   Mediastinum/Nodes: No supraclavicular adenopathy. No mediastinal or definite hilar adenopathy, given limitations of unenhanced CT.   Lungs/Pleura: Trace right pleural fluid and thickening, similar.   Right lower lobectomy.  Moderate centrilobular emphysema.   The majority of bilateral pleural-based pulmonary nodules are similar, and identified on series 8.   However, there are enlarging and new left lower lobe pulmonary nodules. For example, medial left lower lobe subpleural 1.7 x 1.3 cm nodule on 116/8 is at the site of a 5 mm nodule on the prior exam.   Cephalad to this, a subpleural left lower lobe nodule measures 1.3 x 1.3 cm on 100/8, and is new or markedly increased. Just posterior to this, a new satellite nodule measures 9 mm on 100/8.   Interstitial lung disease is similar, and evidenced by subpleural reticulation, bronchiectasis and suggestion of developing honeycombing.   Upper Abdomen: Normal  imaged portions of the spleen, stomach, pancreas, gallbladder, adrenal glands, kidneys. Focal steatosis adjacent the falciform ligament. Scattered colonic diverticula.   Musculoskeletal: No acute osseous abnormality.   IMPRESSION: 1. Status post right lower lobectomy. 2. New and significantly enlarged left lower lobe pulmonary nodules, highly suspicious for metastatic disease versus  metachronous primaries. This is in the setting of other pleural-based pulmonary nodularity which is not significantly changed. 3. Aortic atherosclerosis (ICD10-I70.0) and emphysema (ICD10-J43.9).   * onc *     Electronically Signed   By: Abigail Miyamoto M.D.   On: 03/23/2021 12:43    Impression:  His CT scan of the chest shows enlarging and new left lower lobe pulmonary nodules that are suspicious for metastatic disease or metachronous primaries given his history of prior lung cancer resection in June 2020.  This is in the setting of other pleural-based pulmonary nodularity that has not changed.  He does have signs of interstitial lung disease in the lung bases and recently had COVID-19 infection.  I reviewed the CT images with him and his wife.  I recommended that we get a PET scan to evaluate this further and then I will have him return to see Dr. Roxan Hockey to review this in case a biopsy is required.  Plan:  He will be scheduled for a PET scan and then will have consultation with Dr. Roxan Hockey to review the results and decide if biopsy is warranted.  I spent 20 minutes performing this established patient evaluation and > 50% of this time was spent face to face counseling and coordinating the care of this patient's left lower lobe pulmonary nodules and lung cancer surveillance.   Gaye Pollack, MD [facility] [phone]

## 2021-03-24 ENCOUNTER — Encounter: Payer: Self-pay | Admitting: Surgery

## 2021-03-28 DIAGNOSIS — Z20822 Contact with and (suspected) exposure to covid-19: Secondary | ICD-10-CM | POA: Diagnosis not present

## 2021-04-01 ENCOUNTER — Other Ambulatory Visit: Payer: Self-pay | Admitting: Cardiology

## 2021-04-06 ENCOUNTER — Ambulatory Visit (HOSPITAL_COMMUNITY)
Admission: RE | Admit: 2021-04-06 | Discharge: 2021-04-06 | Disposition: A | Discharge status: Home / Self Care | Payer: Medicare Other | Source: Ambulatory Visit | Attending: Surgery | Admitting: Surgery

## 2021-04-06 ENCOUNTER — Other Ambulatory Visit: Payer: Self-pay

## 2021-04-06 DIAGNOSIS — R918 Other nonspecific abnormal finding of lung field: Secondary | ICD-10-CM | POA: Diagnosis not present

## 2021-04-06 DIAGNOSIS — Z85118 Personal history of other malignant neoplasm of bronchus and lung: Secondary | ICD-10-CM | POA: Diagnosis not present

## 2021-04-06 DIAGNOSIS — Z902 Acquired absence of lung [part of]: Secondary | ICD-10-CM | POA: Insufficient documentation

## 2021-04-06 DIAGNOSIS — J432 Centrilobular emphysema: Secondary | ICD-10-CM | POA: Diagnosis not present

## 2021-04-06 DIAGNOSIS — Z951 Presence of aortocoronary bypass graft: Secondary | ICD-10-CM | POA: Diagnosis not present

## 2021-04-06 LAB — GLUCOSE, CAPILLARY: Glucose-Capillary: 91 mg/dL (ref 70–99)

## 2021-04-06 MED ORDER — FLUDEOXYGLUCOSE F - 18 (FDG) INJECTION
8.4000 | Freq: Once | INTRAVENOUS | Status: AC | PRN
  Administered 2021-04-06: 8.3 via INTRAVENOUS

## 2021-04-07 ENCOUNTER — Encounter: Payer: Self-pay | Admitting: Thoracic Surgery (Cardiothoracic Vascular Surgery)

## 2021-04-07 ENCOUNTER — Ambulatory Visit (INDEPENDENT_AMBULATORY_CARE_PROVIDER_SITE_OTHER): Payer: Medicare Other | Admitting: Thoracic Surgery (Cardiothoracic Vascular Surgery)

## 2021-04-07 VITALS — BP 179/100 | HR 86 | Resp 20 | Ht 71.5 in | Wt 167.0 lb

## 2021-04-07 DIAGNOSIS — Z85118 Personal history of other malignant neoplasm of bronchus and lung: Secondary | ICD-10-CM | POA: Diagnosis not present

## 2021-04-07 DIAGNOSIS — R918 Other nonspecific abnormal finding of lung field: Secondary | ICD-10-CM

## 2021-04-07 NOTE — H&P (View-Only) (Signed)
PCP is [name], MD ?Referring Provider is Gaye Pollack, MD ? ?Chief Complaint  ?Patient presents with  ? Lung Lesion  ?  Surgical consult, PET Scan 04/06/21/ Chest CT 03/23/21  ? ? ?HPI: [name] is sent consultation regarding left lower lobe lung nodules. ? ?Kelly Olson is a 71 year old man with a history of CAD, status post CABG, ischemic cardiomyopathy (resolved), heavy tobacco abuse (quit 2016) stage Ib squamous cell carcinoma of the lung status post right lower lobectomy, interstitial lung disease, and hyperlipidemia.  He has been followed by Dr. Cyndia Bent who did his coronary surgery in 2016.  In 2018 he did a wedge resection and then in 2020 he did a right lower lobectomy.  He has been followed since then. ? ?He recently saw Dr. Cyndia Bent in follow-up.  A CT showed new and/or progressive nodules in the left lower lobe.  He had a PET/CT and now returns to discuss the results. ? ?He feels well.  He is not having any chest pain, pressure, or tightness.  He does get short of breath with exertion but says he recovers quickly.  He does exercise on a regular basis.  No fevers, chills, or sweats.  No change in appetite or weight loss. ? ?Zubrod Score: ?At the time of surgery this patient?s most appropriate activity status/level should be described as: ?[]     0    Normal activity, no symptoms ?[x]     1    Restricted in physical strenuous activity but ambulatory, able to do out light work ?[]     2    Ambulatory and capable of self care, unable to do work activities, up and about >50 % of waking hours                              ?[]     3    Only limited self care, in bed greater than 50% of waking hours ?[]     4    Completely disabled, no self care, confined to bed or chair ?[]     5    Moribund ? ?Past Medical History:  ?Diagnosis Date  ? Coronary artery disease, occlusive 06/02/2014  ? Multivessel CAD.mLAD-100%, mRCA 99%, dRCA 100%, mLCX 90%  And EF 35-45%. -->  Referred for CABG; nonischemic Myoview March  2020  ? Former heavy tobacco smoker   ? Quit in April 2016   ? Hyperlipidemia with target LDL less than 70   ? Ischemic cardiomyopathy - resolved 05/2014  ? Myoview: EF ~33% with "infarction vs. severe resting ischemia in LAD & RCA territory; b) EF by Cath: 35-45%. c) post CABG Echo 9016: EF 50-55%  ? 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 LIMA-OM, RIMA-LAD, SVG-Diag, SVG-rPDA  ? 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 right lung lower lobe -> right lower lobe nodule resection June 2018 (VATS); R thoracotomy with RLL Lobectomy for recurrent PET positive cancer.   Stage T2 a, N0  ? ? ?Past Surgical History:  ?Procedure Laterality Date  ? CARDIAC CATHETERIZATION N/A 06/02/2014  ? Procedure: Left Heart Cath and Coronary Angiography;  Surgeon: Leonie Man, MD;  Location: [facility];  Service: Cardiovascular;  mLAD-100%, mRCA 99%, dRCA 100%, mLCX 90%  And EF 35-45%.  ? CORONARY ARTERY BYPASS GRAFT N/A 06/04/2014  ? Procedure: CORONARY ARTERY BYPASS GRAFTING (CABG) times four using bilateral internal mammary  arteries and EVH for left  leg saphenous vein;  Surgeon: Gaye Pollack, MD;  Location: [facility];  Service: Open Heart Surgery;  LIMA-OM, RIMA-LAD, SVG-Diag, SVG-rPDA  ? NM MYOVIEW LTD  05/28/2014  ? Pre-CABG:  High Risk Nuclear Stress Test with multivessel distribution ischemia.  -- Severely ischemic Cardiomyoapthy with evidence of at least 2 vessel disease & EF of ~33%.  Images are consistent with either infarction or severe resting ischemia in the LAD & RCA territory.  ? NM MYOVIEW LTD  03/2018  ? EF 50-55%.  Small basal inferior fixed defect consistent with diaphragmatic attenuation.  No ischemia or infarction.  Septal dyskinesis due to post CABG state  ? TEE WITHOUT CARDIOVERSION N/A 06/04/2014  ? Procedure: TRANSESOPHAGEAL ECHOCARDIOGRAM (TEE);  Surgeon: Gaye Pollack, MD;  Location: [facility];  Service: Open Heart Surgery;  Laterality: N/A;  ? THORACOTOMY Right 07/10/2016  ? Procedure:  THORACOTOMY;  Surgeon: Gaye Pollack, MD;  Location: [facility];  Service: Thoracic;  Laterality: Right;  ? THORACOTOMY/LOBECTOMY Right 07/29/2018  ? Procedure: THORACOTOMY/RIGHT LOWER LOBECTOMY;  Surgeon: Gaye Pollack, MD;  Location: [facility];  Service: Thoracic;  Laterality: Right;  ? TONSILLECTOMY  1957  ? TRANSTHORACIC ECHOCARDIOGRAM  10/2014  ? Mild concentric LVH. EF 50-55% with mild HK of basal anteroseptal myocardium. GR 1 DD.  ? VIDEO ASSISTED THORACOSCOPY (VATS)/WEDGE RESECTION Right 07/10/2016  ? Procedure: VIDEO ASSISTED THORACOSCOPY (VATS)/WEDGE RESECTION;  Surgeon: Gaye Pollack, MD;  Location: [facility];  Service: Thoracic;  Laterality: Right;  ? ? ?Family History  ?Problem Relation Age of Onset  ? Emphysema Mother   ? Cardiomyopathy Father   ? Healthy Sister   ? Heart attack Maternal Grandfather   ? Healthy Sister   ? Heart disease Paternal Uncle   ? ? ?Social History ?Social History  ? ?Tobacco Use  ? Smoking status: Former  ?  Packs/day: 0.80  ?  Years: 46.00  ?  Pack years: 36.80  ?  Types: Cigarettes  ?  Quit date: 05/29/2014  ?  Years since quitting: 6.8  ? Smokeless tobacco: Never  ?Vaping Use  ? Vaping Use: Never used  ?Substance Use Topics  ? Alcohol use: Yes  ?  Alcohol/week: 7.0 standard drinks  ?  Types: 7 Glasses of wine per week  ? Drug use: No  ? ? ?Current Outpatient Medications  ?Medication Sig Dispense Refill  ? aspirin 81 MG tablet Take 81 mg by mouth daily.    ? atorvastatin (LIPITOR) 40 MG tablet TAKE 1 TABLET AT BEDTIME 90 tablet 1  ? carvedilol (COREG) 3.125 MG tablet TAKE 2 TABLETS TWICE A DAY WITH     MEALS 360 tablet 1  ? losartan (COZAAR) 25 MG tablet TAKE 1 TABLET DAILY 90 tablet 1  ? sildenafil (VIAGRA) 50 MG tablet Take 1 tablet (50 mg total) by mouth daily as needed for erectile dysfunction. 10 tablet 5  ? ?No current facility-administered medications for this visit.  ? ? ?No Known Allergies ? ?Review of Systems  ?Constitutional:  Negative for activity change, chills, fever and  unexpected weight change.  ?HENT:  Negative for trouble swallowing and voice change.   ?Respiratory:  Positive for shortness of breath. Negative for cough and wheezing.   ?Cardiovascular:  Negative for chest pain and leg swelling.  ?Gastrointestinal:  Negative for abdominal distention.  ?Musculoskeletal:  Negative for arthralgias and myalgias.  ?Neurological:  Negative for seizures, syncope and weakness.  ?Hematological:  Negative for adenopathy.  ?  All other systems reviewed and are negative. ? ?BP (!) 179/100   Pulse 86   Resp 20   Ht 5' 11.5" (1.816 m)   Wt 167 lb (75.8 kg)   SpO2 95%   BMI 22.97 kg/m?  ?Physical Exam ?Vitals reviewed.  ?Constitutional:   ?   General: He is not in acute distress. ?   Appearance: Normal appearance.  ?HENT:  ?   Head: Normocephalic and atraumatic.  ?Eyes:  ?   General: No scleral icterus. ?   Extraocular Movements: Extraocular movements intact.  ?Cardiovascular:  ?   Rate and Rhythm: Normal rate and regular rhythm.  ?   Heart sounds: Normal heart sounds. No murmur heard. ?Pulmonary:  ?   Effort: Pulmonary effort is normal.  ?   Comments: Slightly diminished breath sounds right base, faint crackles left base ?Abdominal:  ?   General: There is no distension.  ?   Palpations: Abdomen is soft.  ?Musculoskeletal:     ?   General: No swelling.  ?Lymphadenopathy:  ?   Cervical: No cervical adenopathy.  ?Skin: ?   General: Skin is warm and dry.  ?Neurological:  ?   General: No focal deficit present.  ?   Mental Status: He is alert and oriented to person, place, and time.  ?   Cranial Nerves: No cranial nerve deficit.  ?   Motor: No weakness.  ? ? ?Diagnostic Tests: ?CT CHEST WITHOUT CONTRAST ?  ?TECHNIQUE: ?Multidetector CT imaging of the chest was performed following the ?standard protocol without IV contrast. ?  ?RADIATION DOSE REDUCTION: This exam was performed according to the ?departmental dose-optimization program which includes automated ?exposure control, adjustment of the mA  and/or kV according to ?patient size and/or use of iterative reconstruction technique. ?  ?COMPARISON:  09/27/2020 ?  ?FINDINGS: ?Cardiovascular: Bovine arch. Normal heart size, without pericardial ?effusion. Median

## 2021-04-07 NOTE — Progress Notes (Signed)
PCP is [name], MD Referring Provider is Gaye Pollack, MD  Chief Complaint  Patient presents with   Lung Lesion    Surgical consult, PET Scan 04/06/21/ Chest CT 03/23/21    HPI: Kelly Olson is sent consultation regarding left lower lobe lung nodules.  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  He has been followed by Dr. Cyndia Bent who did his coronary surgery in 2016.  In 2018 he did a wedge resection and then in 2020 he did a right lower lobectomy.  He has been followed since then.  He recently saw Dr. Cyndia Bent in follow-up.  A CT showed new and/or progressive nodules in the left lower lobe.  He had a PET/CT and now returns to discuss the results.  He feels well.  He is not having any chest pain, pressure, or tightness.  He does get short of breath with exertion but says he recovers quickly.  He does exercise on a regular basis.  No fevers, chills, or sweats.  No change in appetite or weight loss.  Zubrod Score: At the time of surgery this patients most appropriate activity status/level should be described as: []     0    Normal activity, no symptoms [x]     1    Restricted in physical strenuous activity but ambulatory, able to do out light work []     2    Ambulatory and capable of self care, unable to do work activities, up and about >50 % of waking hours                              []     3    Only limited self care, in bed greater than 50% of waking hours []     4    Completely disabled, no self care, confined to bed or chair []     5    Moribund  Past Medical History:  Diagnosis Date   Coronary artery disease, occlusive 06/02/2014   Multivessel CAD.mLAD-100%, mRCA 99%, dRCA 100%, mLCX 90%  And EF 35-45%. -->  Referred for CABG; nonischemic Myoview March  2020   Former heavy tobacco smoker    Quit in April 2016    Hyperlipidemia with target LDL less than 70    Ischemic cardiomyopathy - resolved 05/2014   Myoview: EF ~33% with "infarction vs. severe resting ischemia in LAD & RCA territory; b) EF by Cath: 35-45%. 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   Stage T2 a, N0    Past Surgical History:  Procedure Laterality Date   CARDIAC CATHETERIZATION N/A 06/02/2014   Procedure: Left Heart Cath and Coronary Angiography;  Surgeon: Leonie Man, MD;  Location: [facility] INVASIVE CV LAB CUPID;  Service: Cardiovascular;  mLAD-100%, mRCA 99%, dRCA 100%, mLCX 90%  And EF 35-45%.   CORONARY ARTERY BYPASS GRAFT N/A 06/04/2014   Procedure: CORONARY ARTERY BYPASS GRAFTING (CABG) times four using bilateral internal mammary  arteries and EVH for left  leg saphenous vein;  Surgeon: Gaye Pollack, MD;  Location: [facility] OR;  Service: Open Heart Surgery;  LIMA-OM, RIMA-LAD, SVG-Diag, SVG-rPDA   NM MYOVIEW LTD  05/28/2014   Pre-CABG:  High Risk Nuclear Stress Test with multivessel distribution ischemia.  -- Severely ischemic Cardiomyoapthy with evidence of at least 2 vessel disease & EF of ~33%.  Images are consistent with either infarction or severe resting ischemia in the LAD & RCA territory.   NM MYOVIEW LTD  03/2018   EF 50-55%.  Small basal inferior fixed defect consistent with diaphragmatic attenuation.  No ischemia or infarction.  Septal dyskinesis due to post CABG state   TEE WITHOUT CARDIOVERSION N/A 06/04/2014   Procedure: TRANSESOPHAGEAL ECHOCARDIOGRAM (TEE);  Surgeon: Gaye Pollack, MD;  Location: [facility];  Service: Open Heart Surgery;  Laterality: N/A;   THORACOTOMY Right 07/10/2016   Procedure:  THORACOTOMY;  Surgeon: Gaye Pollack, MD;  Location: [facility] OR;  Service: Thoracic;  Laterality: Right;   THORACOTOMY/LOBECTOMY Right 07/29/2018   Procedure: THORACOTOMY/RIGHT LOWER LOBECTOMY;  Surgeon: Gaye Pollack, MD;  Location: [facility] OR;  Service: Thoracic;  Laterality: Right;   [place] ECHOCARDIOGRAM  10/2014   Mild concentric LVH. EF 50-55% with mild HK of basal anteroseptal myocardium. GR 1 DD.   VIDEO ASSISTED THORACOSCOPY (VATS)/WEDGE RESECTION Right 07/10/2016   Procedure: VIDEO ASSISTED THORACOSCOPY (VATS)/WEDGE RESECTION;  Surgeon: Gaye Pollack, MD;  Location: [facility] OR;  Service: Thoracic;  Laterality: Right;    Family History  Problem Relation Age of Onset   Emphysema Mother    Cardiomyopathy Father    Healthy Sister    Heart attack Maternal Grandfather    Healthy Sister    Heart disease Paternal Uncle     Social History Social History   Tobacco Use   Smoking status: Former    Packs/day: 0.80    Years: 46.00    Pack years: 36.80    Types: Cigarettes    Quit date: 05/29/2014    Years since quitting: 6.8   Smokeless tobacco: Never  Vaping Use   Vaping Use: Never used  Substance Use Topics   Alcohol use: Yes    Alcohol/week: 7.0 standard drinks    Types: 7 Glasses of wine per week   Drug use: No    Current Outpatient Medications  Medication Sig Dispense Refill   aspirin 81 MG tablet Take 81 mg by mouth daily.     atorvastatin (LIPITOR) 40 MG tablet TAKE 1 TABLET AT BEDTIME 90 tablet 1   carvedilol (COREG) 3.125 MG tablet TAKE 2 TABLETS TWICE A DAY WITH     MEALS 360 tablet 1   losartan (COZAAR) 25 MG tablet TAKE 1 TABLET DAILY 90 tablet 1   sildenafil (VIAGRA) 50 MG tablet Take 1 tablet (50 mg total) by mouth daily as needed for erectile dysfunction. 10 tablet 5   No current facility-administered medications for this visit.    No Known Allergies  Review of Systems  Constitutional:  Negative for activity change, chills, fever and  unexpected weight change.  HENT:  Negative for trouble swallowing and voice change.   Respiratory:  Positive for shortness of breath. Negative for cough and wheezing.   Cardiovascular:  Negative for chest pain and leg swelling.  Gastrointestinal:  Negative for abdominal distention.  Musculoskeletal:  Negative for arthralgias and myalgias.  Neurological:  Negative for seizures, syncope and weakness.  Hematological:  Negative for adenopathy.  All other systems reviewed and are negative.  BP (!) 179/100    Pulse 86    Resp 20    Ht 5' 11.5" (1.816 m)    Wt 167 lb (75.8 kg)    SpO2 95%    BMI 22.97 kg/m  Physical Exam Vitals reviewed.  Constitutional:      General: He is not in acute distress.    Appearance: Normal appearance.  HENT:     Head: Normocephalic and atraumatic.  Eyes:     General: No scleral icterus.    Extraocular Movements: Extraocular movements intact.  Cardiovascular:     Rate and Rhythm: Normal rate and regular rhythm.     Heart sounds: Normal heart sounds. No murmur heard. Pulmonary:     Effort: Pulmonary effort is normal.     Comments: Slightly diminished breath sounds right base, faint crackles left base Abdominal:     General: There is no distension.     Palpations: Abdomen is soft.  Musculoskeletal:        General: No swelling.  Lymphadenopathy:     Cervical: No cervical adenopathy.  Skin:    General: Skin is warm and dry.  Neurological:     General: No focal deficit present.     Mental Status: He is alert and oriented to person, place, and time.     Cranial Nerves: No cranial nerve deficit.     Motor: No weakness.    Diagnostic Tests: CT CHEST WITHOUT CONTRAST   TECHNIQUE: Multidetector CT imaging of the chest was performed following the standard protocol without IV contrast.   RADIATION DOSE REDUCTION: This exam was performed according to the departmental dose-optimization program which includes automated exposure control, adjustment of the mA  and/or kV according to patient size and/or use of iterative reconstruction technique.   COMPARISON:  09/27/2020   FINDINGS: Cardiovascular: Bovine arch. Normal heart size, without pericardial effusion. Median sternotomy for prior CABG.   Mediastinum/Nodes: No supraclavicular adenopathy. No mediastinal or definite hilar adenopathy, given limitations of unenhanced CT.   Lungs/Pleura: Trace right pleural fluid and thickening, similar.   Right lower lobectomy.  Moderate centrilobular emphysema.   The majority of bilateral pleural-based pulmonary nodules are similar, and identified on series 8.   However, there are enlarging and new left lower lobe pulmonary nodules. For example, medial left lower lobe subpleural 1.7 x 1.3 cm nodule on 116/8 is at the site of a 5 mm nodule on the prior exam.   Cephalad to this, a subpleural left lower lobe nodule measures 1.3 x 1.3 cm on 100/8, and is new or markedly increased. Just posterior to this, a new satellite nodule measures 9 mm on 100/8.   Interstitial lung disease is similar, and evidenced by subpleural reticulation, bronchiectasis and suggestion of developing honeycombing.   Upper Abdomen: Normal imaged portions of the spleen, stomach, pancreas, gallbladder, adrenal glands, kidneys. Focal steatosis adjacent the falciform ligament. Scattered colonic diverticula.   Musculoskeletal: No acute osseous abnormality.   IMPRESSION: 1. Status post right lower lobectomy. 2. New and significantly enlarged left lower lobe pulmonary nodules, highly suspicious for metastatic disease versus metachronous primaries. This is in the setting of other pleural-based pulmonary nodularity which is not significantly changed. 3. Aortic atherosclerosis (ICD10-I70.0) and emphysema (ICD10-J43.9).   * onc *     Electronically Signed   By: Abigail Miyamoto M.D.   On: 03/23/2021 12:43 NUCLEAR MEDICINE PET SKULL BASE TO THIGH   TECHNIQUE: 8.3 mCi F-18 FDG was  injected intravenously. Full-ring PET imaging was performed from the skull base to thigh after the radiotracer. CT data was obtained and used for attenuation correction and anatomic localization.   Fasting blood glucose: 91 mg/dl   COMPARISON:  CT 03/23/2021   FINDINGS: Mediastinal blood pool activity: SUV max 3.71   Liver activity: SUV max NA   NECK: No hypermetabolic lymph nodes in the neck.   Incidental CT findings: none   CHEST: Two nodules persist in the posterior LEFT lower lobe. The more superior nodule measures 18 mm with SUV max equal 3.7. The more inferior nodule measures 21 mm with minimal metabolic activity SUV max equal 3.7. There is misregistration between the CT data set PET data set due to patient respiratory motion.   No hypermetabolic mediastinal lymph nodes. Centrilobular emphysema the upper lobes.   Incidental CT findings: Post CABG.  Post RIGHT lower lobectomy.   ABDOMEN/PELVIS: No abnormal hypermetabolic activity within the liver, pancreas, adrenal glands, or spleen. No hypermetabolic lymph nodes in the abdomen or pelvis.   Incidental CT findings: none   SKELETON: No focal hypermetabolic activity to suggest skeletal metastasis.   Incidental CT findings: none   IMPRESSION: 1. Two persistent pulmonary nodules in the LEFT lower lobe with hypermetabolic activity. Findings concerning for bronchogenic carcinoma. Recommend tissue sampling. 2. No metastatic mediastinal adenopathy. 3. No distant metastatic disease. 4. Centrilobular emphysema.  Post CABG.     Electronically Signed   By: Suzy Bouchard M.D.   On: 04/07/2021 08:09  I personally reviewed the CT and PET/CT images.  There are 2 left lower lobe nodules that are new/enlarged since August 2022.  Mild activity in the nodules.  Per radiology there is misregistration on the lower nodule..  No evidence of adenopathy.  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   Recently had a follow-up visit with Dr. Cyndia Bent.  He was found to have to fairly large lower lobe nodules that were new or enlarged from his prior scan in August.  He then had a PET/CT which showed mild metabolic activity in these nodules.  Given his history of smoking and prior lung cancer in the lung nodule is worrisome for potential cancer.  However, the rate of interval growth on CT does not really correlate well with the relatively low metabolic activity on PET.  I discussed the options of continued observation versus navigational bronchoscopy for biopsy with Mr. and Mrs. Sedita.  We discussed the relative advantages and disadvantages of each approach.  He strongly prefers an aggressive approach.  I recommended we proceed with a navigational bronchoscopy for biopsy of the lesions.  I informed Mr. and Mrs. Hosman of the general nature of the procedure including the need for general anesthesia.  They understand this will be done on an outpatient basis.  We discussed the possibility of a nondiagnostic biopsy.  I am informed him of the indications, risks, benefits, and alternatives.  They understand there are general risks associated with anesthesia.  Procedure specific risks include pneumothorax, bleeding, and failure to make a diagnosis.  He accepts the risks and wishes to proceed.  Plan: Convert CT from February 22 to super D protocol Electromagnetic navigational bronchoscopy on 04/13/2021  I spent over 45 minutes in review of records, images, and in consultation with Mr. Hillmer  today. Melrose Nakayama, MD [facility] [phone]

## 2021-04-08 ENCOUNTER — Other Ambulatory Visit: Payer: Self-pay | Admitting: *Deleted

## 2021-04-08 DIAGNOSIS — R911 Solitary pulmonary nodule: Secondary | ICD-10-CM

## 2021-04-08 NOTE — Progress Notes (Signed)
Surgical Instructions ? ? ? Your procedure is scheduled on 04/13/21. ? Report to [facility] Main Entrance "A" at 10:15 A.M., then check in with the Admitting office. ? Call this number if you have problems the morning of surgery: ? [phone] ? ? If you have any questions prior to your surgery date call [phone]: Open Monday-Friday 8am-4pm ? ? ? Remember: ? Do not eat after midnight the night before your surgery ? ?You may drink clear liquids until 9:15am the morning of your surgery.   ?Clear liquids allowed are: Water, Non-Citrus Juices (without pulp), Carbonated Beverages, Clear Tea, Black Coffee ONLY (NO MILK, CREAM OR POWDERED CREAMER of any kind), and Gatorade ?  ? Take these medicines the morning of surgery with A SIP OF WATER:  ?carvedilol (COREG)  ? ?IF NEEDED: ?acetaminophen (TYLENOL) ? ?As of today, STOP taking any Aspirin (unless otherwise instructed by your surgeon) Aleve, Naproxen, Ibuprofen, Motrin, Advil, Goody's, BC's, all herbal medications, fish oil, and all vitamins. ? ?         ?Do not wear jewelry or makeup ?Do not wear lotions, powders, colognes, or deodorant. ?Men may shave face and neck. ?Do not bring valuables to the hospital. ?Do not wear nail polish, gel polish, artificial nails, or any other type of covering on natural nails (fingers and toes) ?If you have artificial nails or gel coating that need to be removed by a nail salon, please have this removed prior to surgery. Artificial nails or gel coating may interfere with anesthesia's ability to adequately monitor your vital signs. ? ?[facility] is not responsible for any belongings or valuables. .  ? ?Do NOT Smoke (Tobacco/Vaping)  24 hours prior to your procedure ? ?If you use a CPAP at night, you may bring your mask for your overnight stay. ?  ?Contacts, glasses, hearing aids, dentures or partials may not be worn into surgery, please bring cases for these belongings ?  ?For patients admitted to the hospital, discharge time will be  determined by your treatment team. ?  ?Patients discharged the day of surgery will not be allowed to drive home, and someone needs to stay with them for 24 hours. ? ?NO VISITORS WILL BE ALLOWED IN PRE-OP WHERE PATIENTS ARE PREPPED FOR SURGERY.  ONLY 1 SUPPORT PERSON MAY BE PRESENT IN THE WAITING ROOM WHILE YOU ARE IN SURGERY.  IF YOU ARE TO BE ADMITTED, ONCE YOU ARE IN YOUR ROOM YOU WILL BE ALLOWED TWO (2) VISITORS. 1 (ONE) VISITOR MAY STAY OVERNIGHT BUT MUST ARRIVE TO THE ROOM BY 8pm.  Minor children may have two parents present. Special consideration for safety and communication needs will be reviewed on a case by case basis. ? ?Special instructions:   ? ?Oral Hygiene is also important to reduce your risk of infection.  Remember - BRUSH YOUR TEETH THE MORNING OF SURGERY WITH YOUR REGULAR TOOTHPASTE ? ? ?[employer]- Preparing For Surgery ? ?Before surgery, you can play an important role. Because skin is not sterile, your skin needs to be as free of germs as possible. You can reduce the number of germs on your skin by washing with CHG (chlorahexidine gluconate) Soap before surgery.  CHG is an antiseptic cleaner which kills germs and bonds with the skin to continue killing germs even after washing.   ? ? ?Please do not use if you have an allergy to CHG or antibacterial soaps. If your skin becomes reddened/irritated stop using the CHG.  ?Do not shave (including legs and  underarms) for at least 48 hours prior to first CHG shower. It is OK to shave your face. ? ?Please follow these instructions carefully. ?  ? ? Shower the NIGHT BEFORE SURGERY and the MORNING OF SURGERY with CHG Soap.  ? If you chose to wash your hair, wash your hair first as usual with your normal shampoo. After you shampoo, rinse your hair and body thoroughly to remove the shampoo.  Then [employer] and genitals (private parts) with your normal soap and rinse thoroughly to remove soap. ? ?After that Use CHG Soap as you would any other liquid soap. You  can apply CHG directly to the skin and wash gently with a scrungie or a clean washcloth.  ? ?Apply the CHG Soap to your body ONLY FROM THE NECK DOWN.  Do not use on open wounds or open sores. Avoid contact with your eyes, ears, mouth and genitals (private parts). Wash Face and genitals (private parts)  with your normal soap.  ? ?Wash thoroughly, paying special attention to the area where your surgery will be performed. ? ?Thoroughly rinse your body with warm water from the neck down. ? ?DO NOT shower/wash with your normal soap after using and rinsing off the CHG Soap. ? ?Pat yourself dry with a CLEAN TOWEL. ? ?Wear CLEAN PAJAMAS to bed the night before surgery ? ?Place CLEAN SHEETS on your bed the night before your surgery ? ?DO NOT SLEEP WITH PETS. ? ? ?Day of Surgery: ?Take a shower with CHG soap. ?Wear Clean/Comfortable clothing the morning of surgery ?Do not apply any deodorants/lotions.   ?Remember to brush your teeth WITH YOUR REGULAR TOOTHPASTE. ? ? ? ?COVID testing ? ?If you are going to stay overnight or be admitted after your procedure/surgery and require a pre-op COVID test, please follow these instructions after your COVID test  ? ?You are not required to quarantine however you are required to wear a well-fitting mask when you are out and around people not in your household.  If your mask becomes wet or soiled, replace with a new one. ? ?Wash your hands often with soap and water for 20 seconds or clean your hands with an alcohol-based hand sanitizer that contains at least 60% alcohol. ? ?Do not share personal items. ? ?Notify your provider: ?if you are in close contact with someone who has COVID  ?or if you develop a fever of 100.4 or greater, sneezing, cough, sore throat, shortness of breath or body aches. ? ?  ?Please read over the following fact sheets that you were given.  ? ?

## 2021-04-11 ENCOUNTER — Ambulatory Visit (HOSPITAL_COMMUNITY)
Admission: RE | Admit: 2021-04-11 | Discharge: 2021-04-11 | Disposition: A | Discharge status: Home / Self Care | Payer: Medicare Other | Source: Ambulatory Visit | Attending: Thoracic Surgery (Cardiothoracic Vascular Surgery) | Admitting: Thoracic Surgery (Cardiothoracic Vascular Surgery)

## 2021-04-11 ENCOUNTER — Encounter (HOSPITAL_COMMUNITY): Payer: Self-pay

## 2021-04-11 ENCOUNTER — Other Ambulatory Visit: Payer: Self-pay

## 2021-04-11 ENCOUNTER — Other Ambulatory Visit: Payer: Self-pay | Admitting: *Deleted

## 2021-04-11 ENCOUNTER — Encounter (HOSPITAL_COMMUNITY)
Admission: RE | Admit: 2021-04-11 | Discharge: 2021-04-11 | Disposition: A | Discharge status: Home / Self Care | Payer: Medicare Other | Source: Ambulatory Visit | Attending: Thoracic Surgery (Cardiothoracic Vascular Surgery) | Admitting: Thoracic Surgery (Cardiothoracic Vascular Surgery)

## 2021-04-11 VITALS — BP 170/89 | HR 80 | Temp 97.8°F | Resp 17 | Ht 71.0 in | Wt 165.3 lb

## 2021-04-11 DIAGNOSIS — Z951 Presence of aortocoronary bypass graft: Secondary | ICD-10-CM | POA: Diagnosis not present

## 2021-04-11 DIAGNOSIS — Z5181 Encounter for therapeutic drug level monitoring: Secondary | ICD-10-CM | POA: Insufficient documentation

## 2021-04-11 DIAGNOSIS — R911 Solitary pulmonary nodule: Secondary | ICD-10-CM | POA: Diagnosis not present

## 2021-04-11 DIAGNOSIS — Z01818 Encounter for other preprocedural examination: Secondary | ICD-10-CM | POA: Insufficient documentation

## 2021-04-11 DIAGNOSIS — J439 Emphysema, unspecified: Secondary | ICD-10-CM | POA: Diagnosis not present

## 2021-04-11 HISTORY — DX: Gastro-esophageal reflux disease without esophagitis: K21.9

## 2021-04-11 HISTORY — DX: Essential (primary) hypertension: I10

## 2021-04-11 LAB — CBC
HCT: 47.4 % (ref 39.0–52.0)
Hemoglobin: 15.7 g/dL (ref 13.0–17.0)
MCH: 32.4 pg (ref 26.0–34.0)
MCHC: 33.1 g/dL (ref 30.0–36.0)
MCV: 97.9 fL (ref 80.0–100.0)
Platelets: 348 10*3/uL (ref 150–400)
RBC: 4.84 MIL/uL (ref 4.22–5.81)
RDW: 13.2 % (ref 11.5–15.5)
WBC: 7.9 10*3/uL (ref 4.0–10.5)
nRBC: 0 % (ref 0.0–0.2)

## 2021-04-11 LAB — TYPE AND SCREEN
ABO/RH(D): B POS
Antibody Screen: NEGATIVE

## 2021-04-11 LAB — COMPREHENSIVE METABOLIC PANEL
ALT: 41 U/L (ref 0–44)
AST: 27 U/L (ref 15–41)
Albumin: 3.7 g/dL (ref 3.5–5.0)
Alkaline Phosphatase: 68 U/L (ref 38–126)
Anion gap: 8 (ref 5–15)
BUN: 14 mg/dL (ref 8–23)
CO2: 29 mmol/L (ref 22–32)
Calcium: 9.3 mg/dL (ref 8.9–10.3)
Chloride: 102 mmol/L (ref 98–111)
Creatinine, Ser: 0.84 mg/dL (ref 0.61–1.24)
GFR, Estimated: >60 mL/min (ref >60)
Glucose, Bld: 129 mg/dL — ABNORMAL HIGH (ref 70–99)
Potassium: 4.9 mmol/L (ref 3.5–5.1)
Sodium: 139 mmol/L (ref 135–145)
Total Bilirubin: 0.6 mg/dL (ref 0.3–1.2)
Total Protein: 7.2 g/dL (ref 6.5–8.1)

## 2021-04-11 LAB — PROTIME-INR
INR: 1 (ref 0.8–1.2)
Prothrombin Time: 12.7 seconds (ref 11.4–15.2)

## 2021-04-11 LAB — SURGICAL PCR SCREEN

## 2021-04-11 LAB — APTT: aPTT: 33 seconds (ref 24–36)

## 2021-04-11 NOTE — Progress Notes (Signed)
PCP: [name], MD ?Cardiologist:David Ellyn Hack, MD ? ?EKG: 07/26/20 ?CXR:04/11/21 ?ECHO: 10/29/14 ?Stress Test: 04/11/18 ?Cardiac Cath: 06/02/14 ? ?Fasting Blood Sugar- na ?Checks Blood Sugar__na_ times a day ? ?ASA: Patient to call office today for instructions ?Blood Thinner: No ? ?OSA/CPAP:No ? ?Positive home Covid test 01/30/21.  No need to retest.  ? ?Anesthesia Review: Yes, cardiac history ? ?Patient denies shortness of breath, fever, cough, and chest pain at PAT appointment. ? ?Patient verbalized understanding of instructions provided today at the PAT appointment.  Patient asked to review instructions at home and day of surgery.   ?

## 2021-04-11 NOTE — Progress Notes (Signed)
Patient had positive home COVID test on 01/30/21.  Did not retest in PAT ?

## 2021-04-11 NOTE — Progress Notes (Signed)
Lab called and stated PCR was invalid.  Will need PCR DOS. ?

## 2021-04-12 NOTE — Anesthesia Preprocedure Evaluation (Addendum)
Anesthesia Evaluation  ?Patient identified by MRN, date of birth, ID band ?Patient awake ? ? ? ?Reviewed: ?Allergy & Precautions, NPO status , Patient's Chart, lab work & pertinent test results, reviewed documented beta blocker date and time  ? ?History of Anesthesia Complications ?Negative for: history of anesthetic complications ? ?Airway ?Mallampati: I ? ?TM Distance: >3 FB ?Neck ROM: Full ? ? ? Dental ? ?(+) Dental Advisory Given ?  ?Pulmonary ?former smoker,  ?S/p RLL lobectomy for squamous cell ?  ?breath sounds clear to auscultation ? ? ? ? ? ? Cardiovascular ?hypertension, Pt. on medications and Pt. on home beta blockers ?(-) angina+ CAD and + CABG  ? ?Rhythm:Regular Rate:Normal ? ?'20 myoview:  ?Nuclear stress EF: 52%. ?Blood pressure demonstrated a normal response to exercise. ?no ST segment deviation noted during stress. ?No T wave inversion was noted during stress. ?Defect 1: There is a small defect of mild severity present in the basal inferior location. ? This is a low risk study. ? ?'16 ECHO: mild concentric hypertrophy. Systolic function was normal. EF 50-55%. Mild hypokinesis of the basal anteroseptal myocardium. grade 1 DD. Trivial MR. mildly reduced RVF ?  ?Neuro/Psych ?negative neurological ROS ?   ? GI/Hepatic ?Neg liver ROS, GERD  Controlled,  ?Endo/Other  ?negative endocrine ROS ? Renal/GU ?negative Renal ROS  ? ?  ?Musculoskeletal ? ? Abdominal ?  ?Peds ? Hematology ?negative hematology ROS ?(+)   ?Anesthesia Other Findings ? ? Reproductive/Obstetrics ? ?  ? ? ? ? ? ? ? ? ? ? ? ? ? ?  ?  ? ? ? ? ? ? ?Anesthesia Physical ?Anesthesia Plan ? ?ASA: 3 ? ?Anesthesia Plan: General  ? ?Post-op Pain Management: Tylenol PO (pre-op)* and Minimal or no pain anticipated  ? ?Induction: Intravenous ? ?PONV Risk Score and Plan: 2 and Ondansetron and Dexamethasone ? ?Airway Management Planned: Oral ETT ? ?Additional Equipment: None ? ?Intra-op Plan:  ? ?Post-operative Plan:  Extubation in OR ? ?Informed Consent: I have reviewed the patients History and Physical, chart, labs and discussed the procedure including the risks, benefits and alternatives for the proposed anesthesia with the patient or authorized representative who has indicated his/her understanding and acceptance.  ? ? ? ?Dental advisory given ? ?Plan Discussed with: CRNA and Surgeon ? ?Anesthesia Plan Comments: (See APP note by Durel Salts, FNP )  ? ? ? ? ?Anesthesia Quick Evaluation ? ?

## 2021-04-12 NOTE — Progress Notes (Signed)
Anesthesia Chart Review: ? ? Case: [id number] Date/Time: 04/13/21 1159  ? Procedure: VIDEO BRONCHOSCOPY WITH ENDOBRONCHIAL NAVIGATION  ? Anesthesia type: General  ? Pre-op diagnosis: Left Lower Lobe Lung Nodules  ? [facility]: [facility] / [facility]  ? Surgeons: Melrose Nakayama, MD  ? ?  ? ? ?DISCUSSION: ?Pt is 71 years old with hx CAD (s/p CABG 2016), ischemic cardiomyopathy (EF recovered, was 50-55% in 2016), HTN, lung cancer (s/p RL lobectomy) ? ?VS: BP (!) 170/89   Pulse 80   Temp 36.6 ?C (Oral)   Resp 17   Ht 5\' 11"  (1.803 m)   Wt 75 kg   BMI 23.05 kg/m?  ? ?PROVIDERS: ?- PCP is [name], MD ?- Cardiologist is Glenetta Hew, MD, last office visit 07/26/20 ? ? ?LABS: Labs reviewed: Acceptable for surgery. ?(all labs ordered are listed, but only abnormal results are displayed) ? ?Labs Reviewed  ?SURGICAL PCR SCREEN - Abnormal; Notable for the following components:  ?    Result Value  ? MRSA, PCR   (*)   ? Value: INVALID, UNABLE TO DETERMINE THE PRESENCE OF TARGET DUE TO SPECIMEN INTEGRITY. RECOLLECTION REQUESTED.  ? Staphylococcus aureus   (*)   ? Value: INVALID, UNABLE TO DETERMINE THE PRESENCE OF TARGET DUE TO SPECIMEN INTEGRITY. RECOLLECTION REQUESTED.  ? All other components within normal limits  ?COMPREHENSIVE METABOLIC PANEL - Abnormal; Notable for the following components:  ? Glucose, Bld 129 (*)   ? All other components within normal limits  ?CBC  ?PROTIME-INR  ?APTT  ?TYPE AND SCREEN  ? ? ? ?IMAGES: ?CXR 04/11/21:  ?1. Left lower lobe pulmonary nodule. Many of the additional ?pulmonary nodules on CT are not seen by radiograph. ?2. Emphysema ? ?CT chest 03/23/21:  ?1. Status post right lower lobectomy. ?2. New and significantly enlarged left lower lobe pulmonary nodules, highly suspicious for metastatic disease versus metachronous primaries. This is in the setting of other pleural-based pulmonary nodularity which is not significantly changed. ?3. Aortic atherosclerosis (ICD10-I70.0) and emphysema  (ICD10-J43.9). ? ? ?EKG 07/26/20: NSR. Left atrial normality, incomplete right bundle branch block; Normal axis, intervals and durations ? ? ?CV: ?Nuclear stress test 04/11/18:  ?The left ventricular ejection fraction is mildly decreased (45-54%). ?Nuclear stress EF: 52%. ?Blood pressure demonstrated a normal response to exercise. ?There was no ST segment deviation noted during stress. ?No T wave inversion was noted during stress. ?Defect 1: There is a small defect of mild severity present in the basal inferior location. ?This is a low risk study. ? ?Echo 10/29/14:  ?- Left ventricle: The cavity size was normal. There was mild concentric hypertrophy. Systolic function was normal. The estimated ejection fraction was in the range of 50% to 55%. Mild hypokinesis of the basal anteroseptal myocardium. Doppler parameters are consistent with abnormal left ventricular relaxation (grade 1 diastolic dysfunction).  ?- Mitral valve: There was trivial regurgitation.  ?- Right ventricle: The cavity size was normal. Wall thickness was normal. Systolic function was mildly reduced.  ? ? ? ?Past Medical History:  ?Diagnosis Date  ? Coronary artery disease, occlusive 06/02/2014  ? Multivessel CAD.mLAD-100%, mRCA 99%, dRCA 100%, mLCX 90%  And EF 35-45%. -->  Referred for CABG; nonischemic Myoview March 2020  ? Former heavy tobacco smoker   ? Quit in April 2016   ? GERD (gastroesophageal reflux disease)   ? Hyperlipidemia with target LDL less than 70   ? Hypertension   ? Ischemic cardiomyopathy - resolved 05/2014  ?  Myoview: EF ~33% with "infarction vs. severe resting ischemia in LAD & RCA territory; b) EF by Cath: 35-45%. c) post CABG Echo 9016: EF 50-55%  ? 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 LIMA-OM, RIMA-LAD, SVG-Diag, SVG-rPDA  ? 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 right lung lower lobe -> right lower lobe nodule resection June 2018 (VATS); R thoracotomy with RLL Lobectomy for recurrent PET positive cancer.    Stage T2 a, N0  ? ? ?Past Surgical History:  ?Procedure Laterality Date  ? CARDIAC CATHETERIZATION N/A 06/02/2014  ? Procedure: Left Heart Cath and Coronary Angiography;  Surgeon: Leonie Man, MD;  Location: [facility];  Service: Cardiovascular;  mLAD-100%, mRCA 99%, dRCA 100%, mLCX 90%  And EF 35-45%.  ? CORONARY ARTERY BYPASS GRAFT N/A 06/04/2014  ? Procedure: CORONARY ARTERY BYPASS GRAFTING (CABG) times four using bilateral internal mammary arteries and EVH for left  leg saphenous vein;  Surgeon: Gaye Pollack, MD;  [facility];  Service: Open Heart Surgery;  LIMA-OM, RIMA-LAD, SVG-Diag, SVG-rPDA  ? NM MYOVIEW LTD  05/28/2014  ? Pre-CABG:  High Risk Nuclear Stress Test with multivessel distribution ischemia.  -- Severely ischemic Cardiomyoapthy with evidence of at least 2 vessel disease & EF of ~33%.  Images are consistent with either infarction or severe resting ischemia in the LAD & RCA territory.  ? NM MYOVIEW LTD  03/2018  ? EF 50-55%.  Small basal inferior fixed defect consistent with diaphragmatic attenuation.  No ischemia or infarction.  Septal dyskinesis due to post CABG state  ? TEE WITHOUT CARDIOVERSION N/A 06/04/2014  ? Procedure: TRANSESOPHAGEAL ECHOCARDIOGRAM (TEE);  Surgeon: Gaye Pollack, MD;  Location: [place];  Service: Open Heart Surgery;  Laterality: N/A;  ? THORACOTOMY Right 07/10/2016  ? Procedure: THORACOTOMY;  Surgeon: Gaye Pollack, MD;  Location: [facility];  Service: Thoracic;  Laterality: Right;  ? THORACOTOMY/LOBECTOMY Right 07/29/2018  ? Procedure: THORACOTOMY/RIGHT LOWER LOBECTOMY;  Surgeon: Gaye Pollack, MD;  Location: [place];  Service: Thoracic;  Laterality: Right;  ? TONSILLECTOMY  1957  ? TRANSTHORACIC ECHOCARDIOGRAM  10/2014  ? Mild concentric LVH. EF 50-55% with mild HK of basal anteroseptal myocardium. GR 1 DD.  ? VIDEO ASSISTED THORACOSCOPY (VATS)/WEDGE RESECTION Right 07/10/2016  ? Procedure: VIDEO ASSISTED THORACOSCOPY (VATS)/WEDGE RESECTION;  Surgeon: Gaye Pollack, MD;  [facility];  Service: Thoracic;  Laterality: Right;  ? ? ?MEDICATIONS: ? acetaminophen (TYLENOL) 325 MG tablet  ? aspirin 81 MG tablet  ? atorvastatin (LIPITOR) 40 MG tablet  ? carvedilol (COREG) 3.125 MG tablet  ? diphenhydrAMINE (BENADRYL) 50 MG capsule  ? losartan (COZAAR) 25 MG tablet  ? sildenafil (VIAGRA) 50 MG tablet  ? ?No current facility-administered medications for this encounter.  ? ? ?If no changes, I anticipate pt can proceed with surgery as scheduled.  ? ?Willeen Cass, PhD, FNP-BC ?[facility]/Anesthesiology ?Phone: [phone] ?04/12/2021 8:40 AM ? ? ? ? ? ? ? ?

## 2021-04-13 ENCOUNTER — Ambulatory Visit (HOSPITAL_COMMUNITY): Payer: Medicare Other | Admitting: Certified Registered Nurse Anesthetist

## 2021-04-13 ENCOUNTER — Ambulatory Visit (HOSPITAL_COMMUNITY): Payer: Medicare Other | Admitting: Emergency Medicine

## 2021-04-13 ENCOUNTER — Ambulatory Visit (HOSPITAL_COMMUNITY): Payer: Medicare Other

## 2021-04-13 ENCOUNTER — Other Ambulatory Visit: Payer: Self-pay

## 2021-04-13 ENCOUNTER — Inpatient Hospital Stay (HOSPITAL_COMMUNITY): Payer: Medicare Other

## 2021-04-13 ENCOUNTER — Observation Stay (HOSPITAL_COMMUNITY)
Admission: RE | Admit: 2021-04-13 | Discharge: 2021-04-14 | Disposition: A | Discharge status: Home / Self Care | Payer: Medicare Other | Attending: Thoracic Surgery (Cardiothoracic Vascular Surgery) | Admitting: Thoracic Surgery (Cardiothoracic Vascular Surgery)

## 2021-04-13 ENCOUNTER — Encounter (HOSPITAL_COMMUNITY)
Admission: RE | Disposition: A | Discharge status: Home / Self Care | Payer: Self-pay | Source: Home / Self Care | Attending: Thoracic Surgery (Cardiothoracic Vascular Surgery)

## 2021-04-13 ENCOUNTER — Encounter (HOSPITAL_COMMUNITY): Payer: Self-pay | Admitting: Thoracic Surgery (Cardiothoracic Vascular Surgery)

## 2021-04-13 DIAGNOSIS — Z79899 Other long term (current) drug therapy: Secondary | ICD-10-CM | POA: Diagnosis not present

## 2021-04-13 DIAGNOSIS — I2510 Atherosclerotic heart disease of native coronary artery without angina pectoris: Secondary | ICD-10-CM | POA: Insufficient documentation

## 2021-04-13 DIAGNOSIS — Z902 Acquired absence of lung [part of]: Secondary | ICD-10-CM | POA: Diagnosis not present

## 2021-04-13 DIAGNOSIS — R911 Solitary pulmonary nodule: Secondary | ICD-10-CM

## 2021-04-13 DIAGNOSIS — J9811 Atelectasis: Secondary | ICD-10-CM | POA: Diagnosis not present

## 2021-04-13 DIAGNOSIS — J939 Pneumothorax, unspecified: Secondary | ICD-10-CM | POA: Diagnosis present

## 2021-04-13 DIAGNOSIS — Z9889 Other specified postprocedural states: Principal | ICD-10-CM

## 2021-04-13 DIAGNOSIS — Z87891 Personal history of nicotine dependence: Secondary | ICD-10-CM | POA: Insufficient documentation

## 2021-04-13 DIAGNOSIS — Z951 Presence of aortocoronary bypass graft: Secondary | ICD-10-CM | POA: Insufficient documentation

## 2021-04-13 DIAGNOSIS — C3432 Malignant neoplasm of lower lobe, left bronchus or lung: Principal | ICD-10-CM | POA: Insufficient documentation

## 2021-04-13 DIAGNOSIS — I10 Essential (primary) hypertension: Secondary | ICD-10-CM | POA: Diagnosis not present

## 2021-04-13 DIAGNOSIS — Z4682 Encounter for fitting and adjustment of non-vascular catheter: Secondary | ICD-10-CM

## 2021-04-13 DIAGNOSIS — R918 Other nonspecific abnormal finding of lung field: Secondary | ICD-10-CM | POA: Diagnosis not present

## 2021-04-13 DIAGNOSIS — Z7982 Long term (current) use of aspirin: Secondary | ICD-10-CM | POA: Insufficient documentation

## 2021-04-13 DIAGNOSIS — Z419 Encounter for procedure for purposes other than remedying health state, unspecified: Secondary | ICD-10-CM

## 2021-04-13 LAB — SURGICAL PCR SCREEN

## 2021-04-13 SURGERY — VIDEO BRONCHOSCOPY WITH ENDOBRONCHIAL NAVIGATION
Anesthesia: General

## 2021-04-13 MED ORDER — ALBUTEROL SULFATE (2.5 MG/3ML) 0.083% IN NEBU
2.5000 mg | INHALATION_SOLUTION | Freq: Once | RESPIRATORY_TRACT | Status: AC
  Administered 2021-04-13: 2.5 mg via RESPIRATORY_TRACT

## 2021-04-13 MED ORDER — LIDOCAINE HCL (PF) 0.5 % IJ SOLN
INTRAMUSCULAR | Status: AC
  Filled 2021-04-13: qty 50

## 2021-04-13 MED ORDER — DIPHENHYDRAMINE HCL 25 MG PO CAPS: 50.0000 mg | ORAL_CAPSULE | Freq: Every evening | ORAL | Status: DC | PRN

## 2021-04-13 MED ORDER — PROPOFOL 10 MG/ML IV BOLUS
INTRAVENOUS | Status: DC | PRN
  Administered 2021-04-13: 100 mg via INTRAVENOUS

## 2021-04-13 MED ORDER — ACETAMINOPHEN 10 MG/ML IV SOLN
INTRAVENOUS | Status: DC | PRN
  Administered 2021-04-13: 1000 mg via INTRAVENOUS

## 2021-04-13 MED ORDER — CHLORHEXIDINE GLUCONATE 0.12 % MT SOLN
15.0000 mL | Freq: Once | OROMUCOSAL | Status: AC
  Administered 2021-04-13: 15 mL via OROMUCOSAL
  Filled 2021-04-13: qty 15

## 2021-04-13 MED ORDER — ORAL CARE MOUTH RINSE: 15.0000 mL | Freq: Once | OROMUCOSAL | Status: AC

## 2021-04-13 MED ORDER — ACETAMINOPHEN 500 MG PO TABS: 1000.0000 mg | ORAL_TABLET | Freq: Once | ORAL | Status: DC

## 2021-04-13 MED ORDER — LACTATED RINGERS IV SOLN: INTRAVENOUS | Status: DC

## 2021-04-13 MED ORDER — EPINEPHRINE PF 1 MG/ML IJ SOLN
INTRAMUSCULAR | Status: DC | PRN
Start: 2021-04-13 — End: 2021-04-13
  Administered 2021-04-13: 1 mg via ENDOTRACHEOPULMONARY

## 2021-04-13 MED ORDER — OXYCODONE HCL 5 MG PO TABS: 5.0000 mg | ORAL_TABLET | ORAL | Status: DC | PRN

## 2021-04-13 MED ORDER — OXYCODONE HCL 5 MG PO TABS: 5.0000 mg | ORAL_TABLET | Freq: Once | ORAL | Status: DC | PRN

## 2021-04-13 MED ORDER — MIDAZOLAM HCL 2 MG/2ML IJ SOLN: 0.5000 mg | Freq: Once | INTRAMUSCULAR | Status: DC | PRN

## 2021-04-13 MED ORDER — 0.9 % SODIUM CHLORIDE (POUR BTL) OPTIME
TOPICAL | Status: DC | PRN
  Administered 2021-04-13: 1000 mL

## 2021-04-13 MED ORDER — LOSARTAN POTASSIUM 25 MG PO TABS
25.0000 mg | ORAL_TABLET | Freq: Every day | ORAL | Status: DC
  Administered 2021-04-13 – 2021-04-14 (×2): 25 mg via ORAL
  Filled 2021-04-13 (×2): qty 1

## 2021-04-13 MED ORDER — HYDROMORPHONE HCL 1 MG/ML IJ SOLN
0.2500 mg | INTRAMUSCULAR | Status: DC | PRN
  Administered 2021-04-13: 0.5 mg via INTRAVENOUS

## 2021-04-13 MED ORDER — ACETAMINOPHEN 500 MG PO TABS
1000.0000 mg | ORAL_TABLET | Freq: Four times a day (QID) | ORAL | Status: DC | PRN
  Administered 2021-04-13 – 2021-04-14 (×2): 1000 mg via ORAL
  Filled 2021-04-13 (×2): qty 2

## 2021-04-13 MED ORDER — ONDANSETRON HCL 4 MG/2ML IJ SOLN
INTRAMUSCULAR | Status: AC
  Filled 2021-04-13: qty 2

## 2021-04-13 MED ORDER — CARVEDILOL 3.125 MG PO TABS
3.1250 mg | ORAL_TABLET | Freq: Two times a day (BID) | ORAL | Status: DC
  Administered 2021-04-13 – 2021-04-14 (×2): 3.125 mg via ORAL
  Filled 2021-04-13 (×3): qty 1

## 2021-04-13 MED ORDER — ASPIRIN EC 81 MG PO TBEC
81.0000 mg | DELAYED_RELEASE_TABLET | Freq: Every day | ORAL | Status: DC
  Filled 2021-04-13: qty 1

## 2021-04-13 MED ORDER — FENTANYL CITRATE (PF) 250 MCG/5ML IJ SOLN
INTRAMUSCULAR | Status: AC
  Filled 2021-04-13: qty 5

## 2021-04-13 MED ORDER — HYDROMORPHONE HCL 1 MG/ML IJ SOLN
INTRAMUSCULAR | Status: AC
  Filled 2021-04-13: qty 1

## 2021-04-13 MED ORDER — DEXAMETHASONE SODIUM PHOSPHATE 10 MG/ML IJ SOLN
INTRAMUSCULAR | Status: DC | PRN
  Administered 2021-04-13: 10 mg via INTRAVENOUS

## 2021-04-13 MED ORDER — EPINEPHRINE PF 1 MG/ML IJ SOLN
INTRAMUSCULAR | Status: AC
  Filled 2021-04-13: qty 1

## 2021-04-13 MED ORDER — ACETAMINOPHEN 10 MG/ML IV SOLN
INTRAVENOUS | Status: AC
  Filled 2021-04-13: qty 100

## 2021-04-13 MED ORDER — ROCURONIUM BROMIDE 10 MG/ML (PF) SYRINGE
PREFILLED_SYRINGE | INTRAVENOUS | Status: DC | PRN
  Administered 2021-04-13: 20 mg via INTRAVENOUS
  Administered 2021-04-13: 60 mg via INTRAVENOUS

## 2021-04-13 MED ORDER — ONDANSETRON HCL 4 MG/2ML IJ SOLN
INTRAMUSCULAR | Status: DC | PRN
  Administered 2021-04-13: 4 mg via INTRAVENOUS

## 2021-04-13 MED ORDER — MEPERIDINE HCL 25 MG/ML IJ SOLN: 6.2500 mg | INTRAMUSCULAR | Status: DC | PRN

## 2021-04-13 MED ORDER — ALBUTEROL SULFATE (2.5 MG/3ML) 0.083% IN NEBU
INHALATION_SOLUTION | RESPIRATORY_TRACT | Status: AC
  Filled 2021-04-13: qty 3

## 2021-04-13 MED ORDER — SUGAMMADEX SODIUM 200 MG/2ML IV SOLN
INTRAVENOUS | Status: DC | PRN
  Administered 2021-04-13: 300 mg via INTRAVENOUS

## 2021-04-13 MED ORDER — LIDOCAINE 2% (20 MG/ML) 5 ML SYRINGE
INTRAMUSCULAR | Status: DC | PRN
  Administered 2021-04-13: 60 mg via INTRAVENOUS

## 2021-04-13 MED ORDER — ROCURONIUM BROMIDE 10 MG/ML (PF) SYRINGE
PREFILLED_SYRINGE | INTRAVENOUS | Status: AC
  Filled 2021-04-13: qty 10

## 2021-04-13 MED ORDER — ATORVASTATIN CALCIUM 40 MG PO TABS
40.0000 mg | ORAL_TABLET | Freq: Every day | ORAL | Status: DC
  Administered 2021-04-13: 40 mg via ORAL
  Filled 2021-04-13: qty 1

## 2021-04-13 MED ORDER — PROPOFOL 10 MG/ML IV BOLUS
INTRAVENOUS | Status: AC
  Filled 2021-04-13: qty 20

## 2021-04-13 MED ORDER — PHENYLEPHRINE HCL-NACL 20-0.9 MG/250ML-% IV SOLN
INTRAVENOUS | Status: DC | PRN
  Administered 2021-04-13: 30 ug/min via INTRAVENOUS

## 2021-04-13 MED ORDER — MORPHINE BOLUS VIA INFUSION: 2.0000 mg | INTRAVENOUS | Status: DC | PRN

## 2021-04-13 MED ORDER — OXYCODONE HCL 5 MG/5ML PO SOLN: 5.0000 mg | Freq: Once | ORAL | Status: DC | PRN

## 2021-04-13 MED ORDER — LIDOCAINE 2% (20 MG/ML) 5 ML SYRINGE
INTRAMUSCULAR | Status: AC
  Filled 2021-04-13: qty 5

## 2021-04-13 MED ORDER — PHENYLEPHRINE 40 MCG/ML (10ML) SYRINGE FOR IV PUSH (FOR BLOOD PRESSURE SUPPORT)
PREFILLED_SYRINGE | INTRAVENOUS | Status: DC | PRN
  Administered 2021-04-13 (×2): 80 ug via INTRAVENOUS

## 2021-04-13 MED ORDER — MORPHINE SULFATE (PF) 2 MG/ML IV SOLN: 2.0000 mg | INTRAVENOUS | Status: DC | PRN

## 2021-04-13 MED ORDER — FENTANYL CITRATE (PF) 250 MCG/5ML IJ SOLN
INTRAMUSCULAR | Status: DC | PRN
  Administered 2021-04-13: 50 ug via INTRAVENOUS

## 2021-04-13 SURGICAL SUPPLY — 48 items
ADAPTER BRONCHOSCOPE OLYMP 190 (ADAPTER) ×1 IMPLANT
ADAPTER BRONCHOSCOPE OLYMPUS (ADAPTER) ×3 IMPLANT
ADAPTER VALVE BIOPSY EBUS (MISCELLANEOUS) IMPLANT
ADPR BSCP OLMPS EDG (ADAPTER) ×1
ADPR BSCP STRL LF REUSE (ADAPTER) ×1
ADPTR VALVE BIOPSY EBUS (MISCELLANEOUS)
BLADE CLIPPER SURG (BLADE) ×3 IMPLANT
BRUSH BIOPSY BRONCH 10 SDTNB (MISCELLANEOUS) IMPLANT
BRUSH SUPERTRAX BIOPSY (INSTRUMENTS) IMPLANT
BRUSH SUPERTRAX NDL-TIP CYTO (INSTRUMENTS) ×4 IMPLANT
CANISTER SUCT 3000ML PPV (MISCELLANEOUS) ×3 IMPLANT
CNTNR URN SCR LID CUP LEK RST (MISCELLANEOUS) ×4 IMPLANT
CONT SPEC 4OZ STRL OR WHT (MISCELLANEOUS) ×6
COVER BACK TABLE 60X90IN (DRAPES) ×3 IMPLANT
FILTER STRAW FLUID ASPIR (MISCELLANEOUS) IMPLANT
FORCEPS BIOP SUPERTRX PREMAR (INSTRUMENTS) ×2 IMPLANT
GAUZE 4X4 16PLY ~~LOC~~+RFID DBL (SPONGE) ×3 IMPLANT
GAUZE SPONGE 4X4 12PLY STRL (GAUZE/BANDAGES/DRESSINGS) ×3 IMPLANT
GLOVE SURG SIGNA 7.5 PF LTX (GLOVE) ×3 IMPLANT
GOWN STRL REUS W/ TWL XL LVL3 (GOWN DISPOSABLE) ×2 IMPLANT
GOWN STRL REUS W/TWL XL LVL3 (GOWN DISPOSABLE) ×2
KIT CLEAN ENDO COMPLIANCE (KITS) ×3 IMPLANT
KIT ILLUMISITE 180 PROCEDURE (KITS) ×1 IMPLANT
KIT ILLUMISITE 90 PROCEDURE (KITS) IMPLANT
KIT TURNOVER KIT B (KITS) ×3 IMPLANT
MARKER SKIN DUAL TIP RULER LAB (MISCELLANEOUS) ×3 IMPLANT
NDL SUPERTRX PREMARK BIOPSY (NEEDLE) IMPLANT
NEEDLE SUPERTRX PREMARK BIOPSY (NEEDLE) ×4 IMPLANT
NS IRRIG 1000ML POUR BTL (IV SOLUTION) ×3 IMPLANT
OIL SILICONE PENTAX (PARTS (SERVICE/REPAIRS)) ×3 IMPLANT
PAD ARMBOARD 7.5X6 YLW CONV (MISCELLANEOUS) ×6 IMPLANT
PATCHES PATIENT (LABEL) ×9 IMPLANT
SPONGE T-LAP 18X18 ~~LOC~~+RFID (SPONGE) ×12 IMPLANT
SPONGE T-LAP 4X18 ~~LOC~~+RFID (SPONGE) ×3 IMPLANT
SYR 20ML ECCENTRIC (SYRINGE) ×3 IMPLANT
SYR 20ML LL LF (SYRINGE) ×3 IMPLANT
SYR 30ML LL (SYRINGE) ×3 IMPLANT
SYR 5ML LL (SYRINGE) ×3 IMPLANT
SYR TB 1ML LUER SLIP (SYRINGE) IMPLANT
TOWEL GREEN STERILE (TOWEL DISPOSABLE) ×3 IMPLANT
TOWEL GREEN STERILE FF (TOWEL DISPOSABLE) ×3 IMPLANT
TRAP SPECIMEN MUCUS 40CC (MISCELLANEOUS) ×3 IMPLANT
TUBE CONNECTING 20X1/4 (TUBING) ×6 IMPLANT
UNDERPAD 30X36 HEAVY ABSORB (UNDERPADS AND DIAPERS) ×3 IMPLANT
VALVE BIOPSY  SINGLE USE (MISCELLANEOUS) ×2
VALVE BIOPSY SINGLE USE (MISCELLANEOUS) ×2 IMPLANT
VALVE SUCTION BRONCHIO DISP (MISCELLANEOUS) ×3 IMPLANT
WATER STERILE IRR 1000ML POUR (IV SOLUTION) ×3 IMPLANT

## 2021-04-13 NOTE — Op Note (Signed)
NAME: Olson, Kelly S. ?MEDICAL RECORD NO: [id number] ?ACCOUNT NO: [id number] ?DATE OF BIRTH: July 31, 1950 ?FACILITY[facility] ?LOCATION: [facility] ?PHYSICIAN: Revonda Standard. Roxan Hockey, MD ? ?Operative Report  ? ?DATE OF PROCEDURE: 04/13/2021 ? ?PREOPERATIVE DIAGNOSIS:  Left lower lobe lung nodules. ? ?POSTOPERATIVE DIAGNOSIS:  Left lower lobe lung nodules. ? ?PROCEDURE:  Electromagnetic navigational bronchoscopy with needle aspirations, brushings and transbronchial biopsies of two left lower lobe lung nodules. ? ?SURGEON:  Revonda Standard. Roxan Hockey, MD ? ?ASSISTANT:  None. ? ?ANESTHESIA:  General. ? ?FINDINGS:  Some atypical cells and reactive cells seen on quick preps.  No definite tumor seen. ? ?CLINICAL NOTE:  Mr. Arterberry is a 71 year old gentleman with a previous history of lung cancer as well as interstitial lung disease.  He had a wedge resection in 2018 and then a right lower lobectomy in 2020.  He recently had a followup CT, which showed  ?new lung nodules in the left lower lobe.  These were in relatively close proximity.  On PET/CT, there was mild activity.  Given his history, he was advised to undergo biopsy and navigational bronchoscopy was recommended.  The indications, risks, benefits, and alternatives were discussed in detail with the patient.  He understood and accepted the risks and agreed to proceed. ? ?DESCRIPTION OF PROCEDURE:  Mr. Ingwersen was brought to the operating room on 04/13/2021.  He had induction of general anesthesia.  Planning for the navigational bronchoscopy was done on the console prior to induction. Sequential compression devices were  ?placed on the calves for DVT prophylaxis.  A timeout was performed.  Flexible fiberoptic bronchoscopy was performed.  There was a well-healed right lower lobe bronchial stump.  The endobronchial anatomy was otherwise unremarkable with no endobronchial  ?lesions to the level of the subsegmental bronchi.  Locatable guide for navigation was placed.  Registration was  performed.  There was good correlation of the video and virtual bronchoscopy.  The bronchoscope then was directed to the left lower lobe  ?superior segmental bronchus and the locatable guide was advanced to within 1.5 cm of the nodule in the superior segment with good alignment. Attempt was made to perform local registration, but there were technical difficulties. Sampling of the lesion was ? performed.  Fluoroscopy was used during all sampling.  Three needle brushings were performed followed by three needle aspirations.  These were sent for quick prep and while awaiting that result, multiple biopsies were obtained.  Biopsies were sent for both AFB  ?and fungal cultures as well as for permanent pathology. Quick prep did show some atypical cells. ? ?The bronchoscope then was redirected to the basilar segmental bronchus and the catheter was advanced, this time to within a centimeter of the more basilar nodule with good alignment.  Local registration was performed and the catheter remained in good alignment with the nodule.  The sampling process was repeated. This time, the needle aspirations were performed first and then after doing three of those, three needle brushings were performed and then finally multiple biopsies were obtained.  Again, ? sending part of the specimen for AFB and fungal cultures, but the majority for permanent pathology.  After completing the biopsies, there was no bleeding from the airways.  The total fluoroscopy time was 6.5 minutes with a total dose of 108 milligray.   ?Final inspection was made with the fluoroscope and there was no evidence of pneumothorax.  The patient then was extubated in the operating room and taken to the postanesthetic care unit in good condition. ? ? ?  [facility] ?D: 04/13/2021 5:28:37 pm T: 04/13/2021 10:52:00 pm  ?JOB: [id number]/ [id number]  ?

## 2021-04-13 NOTE — Brief Op Note (Signed)
04/13/2021 ? ?1:57 PM ? ?PATIENT:  Fransico Setters.  71 y.o. male ? ?PRE-OPERATIVE DIAGNOSIS:  Left Lower Lobe Lung Nodules ? ?POST-OPERATIVE DIAGNOSIS:  Left Lower Lobe Lung Nodules ? ?PROCEDURE:  Procedure(s): ?VIDEO BRONCHOSCOPY WITH ENDOBRONCHIAL NAVIGATION (N/A) ?Needle aspirations, brushings and transbronchial biopsies of 2 nodules ? ?SURGEON:  Surgeon(s) and Role: ?   * Melrose Nakayama, MD - Primary ? ?PHYSICIAN ASSISTANT:  ? ?ASSISTANTS: none  ? ?ANESTHESIA:   general ? ?EBL:  2 mL  ? ?BLOOD ADMINISTERED:none ? ?DRAINS: none  ? ?LOCAL MEDICATIONS USED:  NONE ? ?SPECIMEN:  Source of Specimen:  LLL superior, LLL basilar ? ?DISPOSITION OF SPECIMEN:   Path and micro ? ?COUNTS:  YES ? ?TOURNIQUET:  * No tourniquets in log * ? ?DICTATION: .Other Dictation: Dictation Number - ? ?PLAN OF CARE: Discharge to home after PACU ? ?PATIENT DISPOSITION:  PACU - hemodynamically stable. ?  ?Delay start of Pharmacological VTE agent (>24hrs) due to surgical blood loss or risk of bleeding: not applicable ? ?

## 2021-04-13 NOTE — Progress Notes (Signed)
Dr. Glennon Mac was informed about patient's BP being elevated. No new orders at this time ?

## 2021-04-13 NOTE — Anesthesia Procedure Notes (Addendum)
Procedure Name: Intubation ?Date/Time: 04/13/2021 12:43 PM ?Performed by: Valda Favia, CRNA ?Pre-anesthesia Checklist: Patient identified, Emergency Drugs available, Suction available and Patient being monitored ?Patient Re-evaluated:Patient Re-evaluated prior to induction ?Oxygen Delivery Method: Circle system utilized ?Preoxygenation: Pre-oxygenation with 100% oxygen ?Induction Type: IV induction ?Ventilation: Mask ventilation without difficulty ?Laryngoscope Size: Glidescope and 4 ?Grade View: Grade I ?Tube type: Oral ?Tube size: 8.5 mm ?Number of attempts: 1 ?Airway Equipment and Method: Stylet and Oral airway ?Placement Confirmation: ETT inserted through vocal cords under direct vision, positive ETCO2 and breath sounds checked- equal and bilateral ?Secured at: 25 cm ?Tube secured with: Tape ?Dental Injury: Teeth and Oropharynx as per pre-operative assessment  ? ? ? ? ?

## 2021-04-13 NOTE — Discharge Instructions (Addendum)
Do not drive or engage in heavy physical activity for 24 hours ? ?You may resume normal activities tomorrow ? ?You may cough up small amounts of blood over the next few days ? ?You may use acetaminophen (Tylenol) if needed for discomfort,  you may use over the throat lozenges or cough medication if needed ? ?My office will contact you with a follow up appointment ? ?Call [phone] if you develop chest pain, shortness of breath, fever > 101 F or cough up more than 2 tablespoons of blood ? ?Medicare Outpatient Observation Notice ?  ?Patient name:  Kelly Olson. Patient number:  [id number]  ?                                                                                                                                                                     ?You?re a hospital outpatient receiving observation services. You are not an inpatient because: ? ?  ?brochoscopy ?  ?Patient status is changed from inpatient to observation (outpatient)status as indicated under the Medicare Condition Code-44 Regulations, Chapter 100-04 of the Medicare Claims Processing Manual ?50.3. Date of Status Change  ?                                                                                                                                                                     ?  ?Being an outpatient may affect what you pay in a hospital: ?  ?When you?re a hospital outpatient, your observation stay is covered under Medicare Part B. ?  ?For Part B services, you generally pay: ?  ?A copayment for each outpatient hospital service you get. Part B copayments may vary by type of service. ?  ?20% of the Medicare-approved amount for most doctor services, after the Part B deductible. ?  ?Observation services may affect coverage and payment of your care after you leave the hospital: ? ?  ? ?If you need skilled nursing facility (SNF) care after you leave the hospital, Medicare  Part A will only cover SNF care if you?ve had a 3-day minimum,  medically necessary, inpatient hospital stay for a related illness or injury. An inpatient hospital stay begins the day the hospital admits you as an inpatient based on a doctor?s order and doesn?t include the day you?re discharged. ?  ?If you have Medicaid, a Medicare Advantage plan or other health plan, Medicaid or the plan may have different rules for SNF coverage after you leave the hospital. Check with Medicaid or your plan. ?  ?NOTE: Medicare Part A generally doesn?t cover outpatient hospital services, like an observation stay. However, Part A will generally cover medically necessary inpatient services if the hospital admits you as an inpatient based on a doctor?s order. In most cases, you?ll pay a one-time deductible for all of your inpatient hospital services for the first 60 days you?re in a hospital. ?                                                                                                                                                                     ?If you have any questions about your observation services, ask the hospital staff member giving you this notice or the doctor providing your hospital care. You can also ask to speak with someone from the hospital?s utilization or discharge planning department. ?  ?You can also call 1-800-MEDICARE (1-571-874-4968).  TT[employer] users should call 4326400269. ?  ?Form CMS [id number]   Expiration 01/29/2021 OMB APPROVAL [id number]  ?  ?  ? ?  ? ?Your costs for medications: ? ?  ? ?Generally, prescription and over-the-counter drugs, including ?self-administered drugs,? you get in a hospital outpatient setting (like an emergency department) aren?t covered by Part B. ?Self- administered drugs? are drugs you?d normally take on your own. For safety reasons, many hospitals don?t allow you to take medications brought from home. If you have a Medicare prescription drug plan (Part D), your plan may help you pay for these drugs. You?ll likely need to pay out-of-  pocket for these drugs and submit a claim to your drug plan for a refund. Contact your drug plan for more information. ?  ?                                                                                                                                                                     ?  If you?re enrolled in a Medicare Advantage plan (like an HMO or PPO) or other Medicare health plan (Part C), your costs and coverage may be different. Check with your plan to find out about coverage for outpatient observation services. ? ?  ?If you?re a Chief of Staff through your state Medicaid program, you can?t be billed for Part A or Part B deductibles, coinsurance, and copayments. ? ?                                                                                                                                                                    ?Additional Information (Optional): ?  ?  ?  ?  ?  ?                                                                                                                                                                     ?Please sign below to show you received and understand this notice. ? ?  ? ?                                    Date: 04/14/21 / Time:1:03 PM ?  ?CMS does not discriminate in its programs and activities. To request this publication in alternative format, please call: 1-800-MEDICARE or email:[email or web address]. ?  ?Form CMS [id number]   Expiration 01/29/2021 OMB APPROVAL [id number]  ?  ? ? ?Patient ? ? ?Add ?No image attached ?Trace ?Slow ?Corrupt ?Edit Data ?Change Template ?Print ?On  ? ?

## 2021-04-13 NOTE — Transfer of Care (Signed)
Immediate Anesthesia Transfer of Care Note ? ?Patient: Kelly Olson. ? ?Procedure(s) Performed: VIDEO BRONCHOSCOPY WITH ENDOBRONCHIAL NAVIGATION ? ?Patient Location: PACU ? ?Anesthesia Type:General ? ?Level of Consciousness: awake, alert  and oriented ? ?Airway & Oxygen Therapy: Patient Spontanous Breathing and Patient connected to face mask oxygen ? ?Post-op Assessment: Report given to RN and Post -op Vital signs reviewed and stable ? ?Post vital signs: Reviewed ? ?Last Vitals:  ?Vitals Value Taken Time  ?BP 143/86 04/13/21 1402  ?Temp    ?Pulse 78 04/13/21 1405  ?Resp 16 04/13/21 1405  ?SpO2 92 % 04/13/21 1405  ?Vitals shown include unvalidated device data. ? ?Last Pain:  ?Vitals:  ? 04/13/21 1016  ?PainSc: 0-No pain  ?   ? ?  ? ?Complications: No notable events documented. ?

## 2021-04-13 NOTE — Interval H&P Note (Signed)
History and Physical Interval Note: ? ?04/13/2021 ?11:35 AM ? ?Kelly Olson.  has presented today for surgery, with the diagnosis of Left Lower Lobe Lung Nodules.  The various methods of treatment have been discussed with the patient and family. After consideration of risks, benefits and other options for treatment, the patient has consented to  Procedure(s): ?VIDEO BRONCHOSCOPY WITH ENDOBRONCHIAL NAVIGATION (N/A) as a surgical intervention.  The patient's history has been reviewed, patient examined, no change in status, stable for surgery.  I have reviewed the patient's chart and labs.  Questions were answered to the patient's satisfaction.   ? ? ?Kelly Olson ? ? ?

## 2021-04-13 NOTE — Anesthesia Postprocedure Evaluation (Signed)
Anesthesia Post Note ? ?Patient: Kelly Olson. ? ?Procedure(s) Performed: VIDEO BRONCHOSCOPY WITH ENDOBRONCHIAL NAVIGATION ? ?  ? ?Patient location during evaluation: PACU ?Anesthesia Type: General ?Level of consciousness: awake and alert, oriented and patient cooperative ?Pain management: pain level controlled ?Vital Signs Assessment: post-procedure vital signs reviewed and stable ?Respiratory status: spontaneous breathing, nonlabored ventilation, respiratory function stable and patient connected to nasal cannula oxygen ?Cardiovascular status: blood pressure returned to baseline and stable ?Postop Assessment: no apparent nausea or vomiting and adequate PO intake ?Anesthetic complications: no ? ? ?No notable events documented. ? ?Last Vitals:  ?Vitals:  ? 04/13/21 1548 04/13/21 1618  ?BP: (!) 150/78 (!) 143/78  ?Pulse: 80 80  ?Resp: 20 15  ?Temp:  36.5 ?C  ?SpO2: 96% 96%  ?  ?Last Pain:  ?Vitals:  ? 04/13/21 1548  ?PainSc: 2   ? ? ?  ?  ?  ?  ?  ?  ? ?Jacee Enerson,E. Ishaq Maffei ? ? ? ? ?

## 2021-04-13 NOTE — Progress Notes (Signed)
? ?   ?  [address] ?      [place] [address] ?            [phone]   ? ?  ?Mr. Bundrick's CXR shows a large left pneumothorax ? ?He is not in any distress but we need to place a pigtail catheter to reexpand the lung and then will plan to keep overnight ? ?Discussed risks, benefits with him. He agrees ? ?Revonda Standard Roxan Hockey, MD ?[facility] ?([phone] ? ?

## 2021-04-13 NOTE — Procedures (Signed)
Procedure Note ? ?Mr. Martensen had a large left pneumothorax on post bronch CXR ? ?Informed consent obtained for left chest tube placement ? ?Sterile technique ? ?Time out performed ? ?Local with 5 ml of 1% lidocaine ? ?46F pigtail catheter placed using modified Seldinger technique ? ?+ air leak ? ?Tolerated well ? ?CXR pending ? ?Revonda Standard Roxan Hockey, MD ?[facility] ?([phone] ? ?

## 2021-04-13 NOTE — Progress Notes (Signed)
Microbiology lab called to inform of an invalid surgical PCR sample. Made lab tech aware that the pt is currently having a procedure in main OR. Alvis Lemmings, RN informed, as well. ?

## 2021-04-14 ENCOUNTER — Encounter (HOSPITAL_COMMUNITY): Payer: Self-pay | Admitting: Thoracic Surgery (Cardiothoracic Vascular Surgery)

## 2021-04-14 ENCOUNTER — Inpatient Hospital Stay (HOSPITAL_COMMUNITY): Payer: Medicare Other

## 2021-04-14 ENCOUNTER — Observation Stay (HOSPITAL_COMMUNITY): Payer: Medicare Other

## 2021-04-14 DIAGNOSIS — Z87891 Personal history of nicotine dependence: Secondary | ICD-10-CM | POA: Diagnosis not present

## 2021-04-14 DIAGNOSIS — J9811 Atelectasis: Secondary | ICD-10-CM | POA: Diagnosis not present

## 2021-04-14 DIAGNOSIS — C3432 Malignant neoplasm of lower lobe, left bronchus or lung: Secondary | ICD-10-CM | POA: Diagnosis not present

## 2021-04-14 DIAGNOSIS — I2510 Atherosclerotic heart disease of native coronary artery without angina pectoris: Secondary | ICD-10-CM | POA: Diagnosis not present

## 2021-04-14 DIAGNOSIS — Z7982 Long term (current) use of aspirin: Secondary | ICD-10-CM | POA: Diagnosis not present

## 2021-04-14 DIAGNOSIS — R911 Solitary pulmonary nodule: Secondary | ICD-10-CM | POA: Diagnosis not present

## 2021-04-14 DIAGNOSIS — Z951 Presence of aortocoronary bypass graft: Secondary | ICD-10-CM | POA: Diagnosis not present

## 2021-04-14 DIAGNOSIS — J939 Pneumothorax, unspecified: Secondary | ICD-10-CM | POA: Diagnosis not present

## 2021-04-14 DIAGNOSIS — Z902 Acquired absence of lung [part of]: Secondary | ICD-10-CM | POA: Diagnosis not present

## 2021-04-14 LAB — ACID FAST SMEAR (AFB, MYCOBACTERIA)
Acid Fast Smear: NEGATIVE
Acid Fast Smear: NEGATIVE

## 2021-04-14 LAB — CYTOLOGY - NON PAP

## 2021-04-14 NOTE — Plan of Care (Signed)

## 2021-04-14 NOTE — Progress Notes (Addendum)
? ?   ?  [address] ?      [place] [address] ?            [phone]   ? ?  ?1 Day Post-Op Procedure(s) (LRB): ?VIDEO BRONCHOSCOPY WITH ENDOBRONCHIAL NAVIGATION (N/A) ?Subjective: ?Sitting up in bed, feels well. No chest pain or shortness of breath. No dyspnea on RA.  ? ?Objective: ?Vital signs in last 24 hours: ?Temp:  [97.6 ?F (36.4 ?C)-98 ?F (36.7 ?C)] 97.7 ?F (36.5 ?C) (03/16 0406) ?Pulse Rate:  [69-89] 69 (03/16 0406) ?Cardiac Rhythm: Normal sinus rhythm (03/15 1900) ?Resp:  [11-20] 16 (03/16 0406) ?BP: (114-195)/(72-105) 129/79 (03/16 0406) ?SpO2:  [90 %-99 %] 92 % (03/16 0406) ?Weight:  [74.8 kg] 74.8 kg (03/15 1007) ?  ? ?Intake/Output from previous day: ?03/15 0701 - 03/16 0700 ?In: 8315 [P.O.:240; I.V.:800] ?Out: 2 [Blood:2] ?Intake/Output this shift: ?Total I/O ?In: -  ?Out: 300 [Urine:300] ? ?General appearance: alert, cooperative, and no distress ?Neurologic: intact ?Heart: RRR, no arrhythmias on monitor. ?Lungs: breath sounds clear, CXR not read yet but showing left lung well expanded with no obvious PTX. No air leak but also no respiratory variation in the water seal chamber. Question patency ot the tube.  ? ?Lab Results: ?Recent Labs  ?  04/11/21 ?0903  ?WBC 7.9  ?HGB 15.7  ?HCT 47.4  ?PLT 348  ? ?BMET:  ?Recent Labs  ?  04/11/21 ?0903  ?NA 139  ?K 4.9  ?CL 102  ?CO2 29  ?GLUCOSE 129*  ?BUN 14  ?CREATININE 0.84  ?CALCIUM 9.3  ?  ?PT/INR:  ?Recent Labs  ?  04/11/21 ?0903  ?LABPROT 12.7  ?INR 1.0  ? ?ABG ?   ?Component Value Date/Time  ? PHART 7.423 07/24/2018 1014  ? HCO3 25.2 07/24/2018 1014  ? TCO2 23 06/05/2014 1652  ? ACIDBASEDEF 2.0 06/04/2014 1851  ? O2SAT 95.5 07/24/2018 1014  ? ?CBG (last 3)  ?No results for input(s): GLUCAP in the last 72 hours. ? ?Assessment/Plan: ?S/P Procedure(s) (LRB): ?VIDEO BRONCHOSCOPY WITH ENDOBRONCHIAL NAVIGATION (N/A) ?-POD1 navigational bronch with Bx of left pulmonary nodules with subsequent PTX.  Lefft lung mostly re-expanded after placement  of a left pleural pigtail catheter. No air leak but doupt the tube is functioning. Will discuss mgt with Dr. Roxan Hockey.  ? ? LOS: 1 day  ? ? ?Antony Odea, PA-C ?[phone] ?04/14/2021 ? ?Patient seen and examined. ?No air leak and CXR looks good ?Dc chest tube, possibly home later today ? ?Revonda Standard Roxan Hockey, MD ?[facility] ?[phone] ?

## 2021-04-14 NOTE — Progress Notes (Signed)
? ?   ?  [address] ?      [place] [address] ?            [phone]   ? ?  ?12:20pm ?Left pleural pigtail catheter removed intact and occlusive dressing applied. ?Will repeat the CXR later this afternoon and plan to discharge to home if stable.  ? ?M. Caliya Narine, PA-C ?

## 2021-04-14 NOTE — Care Management CC44 (Signed)
Condition Code 44 Documentation Completed ? ?Patient Details  ?Name: Kelly Olson. ?MRN: [id number] ?Date of Birth: August 17, 1950 ? ? ?Condition Code 44 given:  Yes ?Patient signature on Condition Code 44 notice:  Yes ?Documentation of 2 MD's agreement:  Yes ?Code 44 added to claim:  Yes ? ? ? ?Verdell Carmine, RN ?04/14/2021, 1:06 PM ? ?

## 2021-04-14 NOTE — Progress Notes (Signed)
Pt discharged from unit. Medication/discharge instruction given. ? ?Phoebe Sharps, RN ? ?

## 2021-04-14 NOTE — Hospital Course (Signed)
History of Present Illness: ?The patient is a 71 year old gentleman with a previous history of lung cancer as well as interstitial lung disease.  He had a wedge resection in 2018 and then a right lower lobectomy in 2020.  He recently had a followup CT, which showed  ?new lung nodules in the left lower lobe.  These were in relatively close proximity.  On PET/CT, there was mild activity.  He was advised to undergo biopsy given his history and navigational bronchoscopy was recommended.  The indications, risks, benefits, ? and alternatives were discussed in detail with the patient.  He understood and accepted the risks and agreed to proceed. ? ?Hospital Course: ?Kelly Olson came to the hospital for same-day surgery on 04/13/2021.  Under general anesthesia, he underwent electromagnetic navigational bronchoscopy with needle aspirations, brushings, and transbronchial biopsies up to the left lower lobe lung nodules.  He tolerated procedures well and afterwards was extubated and recovered in the postanesthesia care unit.  Postoperative chest x-ray showed a large left pneumothorax.  A left pigtail catheter was placed at the bedside by Dr. Roxan Hockey.  Repeat chest x-ray showed near complete resolution of the pneumothorax.  Kelly Olson was admitted to 4E for chest tube management.  His respiratory status remained stable.  On morning after admission, he was noted to have no air leak in his respiratory status was stable with excellent oxygen saturation on room air.  Chest x-ray was stable.  Chest tube was removed and follow-up chest x-ray was obtained. ?

## 2021-04-14 NOTE — Care Management Obs Status (Signed)
MEDICARE OBSERVATION STATUS NOTIFICATION ? ? ?Patient Details  ?Name: Kelly Olson [facility]. ?MRN: [id number] ?Date of Birth: 1950/02/04 ? ? ?Medicare Observation Status Notification Given:  Yes ? ? ? ?Verdell Carmine, RN ?04/14/2021, 1:06 PM ?

## 2021-04-14 NOTE — Discharge Summary (Addendum)
Physician Discharge Summary  ?Patient ID: ?Kelly Olson. ?MRN: [id number] ?DOB/AGE: March 09, 1950 71 y.o. ? ?Admit date: 04/13/2021 ?Discharge date: 04/14/2021 ? ?Admission Diagnoses: ?Left pulmonary nodules, left ?Interstitial lung disease, status post right lower lobectomy ?Coronary artery disease status post coronary bypass grafting ?Dyslipidemia ? ?Discharge Diagnoses:  ? ?Left pulmonary nodules, left ?Interstitial lung disease, status post right lower lobectomy ?Coronary artery disease status post coronary bypass grafting ?Dyslipidemia ?S/P bronchoscopy ?Postprocedural pneumothorax on left ? ? ?Discharged Condition: stable ? ?History of Present Illness: ?The patient is a 71 year old gentleman with a previous history of lung cancer as well as interstitial lung disease.  He had a wedge resection in 2018 and then a right lower lobectomy in 2020.  He recently had a followup CT, which showed  ?new lung nodules in the left lower lobe.  These were in relatively close proximity.  On PET/CT, there was mild activity.  He was advised to undergo biopsy given his history and navigational bronchoscopy was recommended.  The indications, risks, benefits, ? and alternatives were discussed in detail with the patient.  He understood and accepted the risks and agreed to proceed. ? ?Hospital Course: ?Kelly Olson came to the hospital for same-day surgery on 04/13/2021.  Under general anesthesia, he underwent electromagnetic navigational bronchoscopy with needle aspirations, brushings, and transbronchial biopsies up to the left lower lobe lung nodules.  He tolerated procedures well and afterwards was extubated and recovered in the postanesthesia care unit.  Postoperative chest x-ray showed a large left pneumothorax.  A left pigtail catheter was placed at the bedside by Dr. Roxan Hockey.  Repeat chest x-ray showed near complete resolution of the pneumothorax.  Kelly Olson was admitted to 4E for chest tube management.  His respiratory  status remained stable.  On morning after admission, he was noted to have no air leak in his respiratory status was stable with excellent oxygen saturation on room air.  Chest x-ray was stable.  Chest tube was removed and follow-up chest x-ray was obtained few hours later showing a stable trace apical pneumothorax on the left as well as a small pneumothorax at the left costophrenic angle.  This had not changed compared with the film obtained earlier in the day when the chest tube was in place.  Kelly Olson was discharged in stable condition.  He was advised to avoid strenuous activity leave the chest tube site dressing in place for 48 hours. ? ?Consults: None ? ?Significant Diagnostic Studies:  ? ?CLINICAL DATA:  Follow-up bronchoscopy ?  ?EXAM: ?PORTABLE CHEST 1 VIEW ?  ?COMPARISON:  04/11/2021 ?  ?FINDINGS: ?Previous median sternotomy and CABG. Left pneumothorax, proximally ?50%. No evidence of tension. Emphysema and chronic pulmonary ?scarring seen otherwise. ?  ?IMPRESSION: ?50% left pneumothorax.  Call report in progress. ?  ?  ?Electronically Signed ?  By: Nelson Chimes M.D. ?  On: 04/13/2021 14:55 ? ? ?CLINICAL DATA:  Pneumothorax post chest tube removal. ?  ?EXAM: ?PORTABLE CHEST 1 VIEW ?  ?COMPARISON:  Chest radiograph performed earlier on the same date ?  ?FINDINGS: ?Interval removal of the left-sided chest tube. Small left apical and ?left costophrenic angle pneumothoraces are unchanged. ?  ?Heart is normal in size. Evidence of prior coronary artery bypass ?grafting. Lungs are clear without evidence of focal consolidation. ?Mild bibasilar atelectasis, unchanged. ?  ?IMPRESSION: ?1. Interval removal of the left-sided chest tube with stable ?appearance of the trace pneumothoraces in the left lung apex and ?left costophrenic angle. ?  ?2.  Mild  bibasilar atelectasis. ?  ?3.  Cardiomediastinal silhouette is unchanged. ?  ? ?Treatments: Surgery ? ?Operative Report  ?  ?DATE OF PROCEDURE: 04/13/2021 ?   ?PREOPERATIVE DIAGNOSIS:  Left lower lobe lung nodules. ?  ?POSTOPERATIVE DIAGNOSIS:  Left lower lobe lung nodules. ?  ?PROCEDURE:  Electromagnetic navigational bronchoscopy with needle aspirations, brushings and transbronchial biopsies of two left lower lobe lung nodules. ?  ?SURGEON:  Revonda Standard. Roxan Hockey, MD ?  ?ASSISTANT:  None. ?  ?ANESTHESIA:  General. ?  ?FINDINGS:  Some atypical cells and reactive cells seen on quick preps.  No definite tumor seen. ?  ?CLINICAL NOTE:  The patient is a 71 year old gentleman with a previous history of lung cancer as well as interstitial lung disease.  He had a wedge resection in 2018 and then a right lower lobectomy in 2020.  He recently had a followup CT, which showed  ?new lung nodules in the left lower lobe.  These were in relatively close proximity.  On PET/CT, there was mild activity.  He was advised to undergo biopsy given his history and navigational bronchoscopy was recommended.  The indications, risks, benefits, ? and alternatives were discussed in detail with the patient.  He understood and accepted the risks and agreed to proceed. ? ? ?Procedure Note ?  ?Kelly Olson had a large left pneumothorax on post bronch CXR ?  ?Informed consent obtained for left chest tube placement ?  ?Sterile technique ?  ?Time out performed ?  ?Local with 5 ml of 1% lidocaine ?  ?52F pigtail catheter placed using modified Seldinger technique ?  ?+ air leak ?  ?Tolerated well ?  ?CXR pending ?  ?Revonda Standard Roxan Hockey, MD ?[facility] ?[phone] ? ?Discharge Exam: ?Blood pressure 137/71, pulse 68, temperature 98.7 ?F (37.1 ?C), temperature source Oral, resp. rate 18, height 5' 11.5" (1.816 m), weight 74.8 kg, SpO2 94 %. ?General appearance: alert, cooperative, and no distress ?Neurologic: intact ?Heart: RRR, no arrhythmias on monitor. ?Lungs: breath sounds clear, and oxygen saturations are stable.  Chest tube was removed earlier today.  Site dressing is clean  and dry with no evidence of air accumulation.   ? ?Allergies as of 04/14/2021   ?No Known Allergies ?  ? ?  ?Medication List  ?  ? ?TAKE these medications   ? ?acetaminophen 325 MG tablet ?Commonly known as: TYLENOL ?Take 650 mg by mouth every 6 (six) hours as needed for moderate pain. ?  ?aspirin 81 MG tablet ?Take 81 mg by mouth daily. ?  ?atorvastatin 40 MG tablet ?Commonly known as: LIPITOR ?TAKE 1 TABLET AT BEDTIME ?  ?carvedilol 3.125 MG tablet ?Commonly known as: COREG ?TAKE 2 TABLETS TWICE A DAY WITH     MEALS ?  ?diphenhydrAMINE 50 MG capsule ?Commonly known as: BENADRYL ?Take 50 mg by mouth at bedtime. ?  ?losartan 25 MG tablet ?Commonly known as: COZAAR ?TAKE 1 TABLET DAILY ?  ?sildenafil 50 MG tablet ?Commonly known as: Viagra ?Take 1 tablet (50 mg total) by mouth daily as needed for erectile dysfunction. ?  ? ?  ? ? Follow-up Information   ? ? Melrose Nakayama, MD. Go on 04/20/2021.   ?Specialty: Cardiothoracic Surgery ?Why: Your appointment is at 3:45pm. ?Contact information: ?[address] ?[address] ?[place] [address] ?[phone] ? ? ?  ?  ? ?  ?  ? ?  ? ? ?Signed: ?Antony Odea, PA-C ?04/14/2021, 2:34 PM ? ? ?

## 2021-04-15 LAB — SURGICAL PATHOLOGY

## 2021-04-18 LAB — AEROBIC/ANAEROBIC CULTURE W GRAM STAIN (SURGICAL/DEEP WOUND)
Culture: NO GROWTH
Culture: NO GROWTH
Gram Stain: NONE SEEN
Gram Stain: NONE SEEN

## 2021-04-20 ENCOUNTER — Other Ambulatory Visit: Payer: Self-pay

## 2021-04-20 ENCOUNTER — Ambulatory Visit (INDEPENDENT_AMBULATORY_CARE_PROVIDER_SITE_OTHER): Payer: Medicare Other | Admitting: Thoracic Surgery (Cardiothoracic Vascular Surgery)

## 2021-04-20 VITALS — BP 185/103 | HR 74 | Resp 20 | Ht 71.0 in | Wt 163.0 lb

## 2021-04-20 DIAGNOSIS — C3432 Malignant neoplasm of lower lobe, left bronchus or lung: Secondary | ICD-10-CM

## 2021-04-20 DIAGNOSIS — Z9889 Other specified postprocedural states: Secondary | ICD-10-CM | POA: Diagnosis not present

## 2021-04-20 NOTE — Progress Notes (Signed)
? ?   ?[address] ?      [place] [address] ?            [phone]   ? ?   ?HPI: [name] returns to discuss the results of his recent navigational bronchoscopy. ? 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  He had a wedge resection by Dr. Cyndia Olson in 2018 and then a right lower lobectomy in 2020.  Recently his CT showed new left lower lobe nodules.  On PET/CT there was mild metabolic activity. ? ?I did a navigational bronchoscopy on 04/13/2021.  The procedure was complicated by pneumothorax and a pigtail catheter was placed.  He went home the following day. ? ?He feels well.  He says his breathing is better than it was prior to the procedure.  He does not have any pain at the tube site. ? ?Past Medical History:  ?Diagnosis Date  ? Coronary artery disease, occlusive 06/02/2014  ? Multivessel CAD.mLAD-100%, mRCA 99%, dRCA 100%, mLCX 90%  And EF 35-45%. -->  Referred for CABG; nonischemic Myoview March 2020  ? Former heavy tobacco smoker   ? Quit in April 2016   ? GERD (gastroesophageal reflux disease)   ? Hyperlipidemia with target LDL less than 70   ? Hypertension   ? Ischemic cardiomyopathy - resolved 05/2014  ? Myoview: EF ~33% with "infarction vs. severe resting ischemia in LAD & RCA territory; b) EF by Cath: 35-45%. c) post CABG Echo 9016: EF 50-55%  ? 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 LIMA-OM, RIMA-LAD, SVG-Diag, SVG-rPDA  ? 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 right lung lower lobe -> right lower lobe nodule resection June 2018 (VATS); R thoracotomy with RLL Lobectomy for recurrent PET positive cancer.   Stage T2 a, N0  ? ? ?Current Outpatient Medications  ?Medication Sig Dispense Refill  ? acetaminophen (TYLENOL) 325 MG tablet Take 650 mg by mouth every 6 (six) hours as needed for moderate pain.    ? aspirin 81 MG tablet Take 81 mg by mouth daily.    ? atorvastatin  (LIPITOR) 40 MG tablet TAKE 1 TABLET AT BEDTIME 90 tablet 1  ? carvedilol (COREG) 3.125 MG tablet TAKE 2 TABLETS TWICE A DAY WITH     MEALS 360 tablet 1  ? losartan (COZAAR) 25 MG tablet TAKE 1 TABLET DAILY 90 tablet 1  ? OVER THE COUNTER MEDICATION [employer] sleep aid    ? sildenafil (VIAGRA) 50 MG tablet Take 1 tablet (50 mg total) by mouth daily as needed for erectile dysfunction. 10 tablet 5  ? ?No current facility-administered medications for this visit.  ? ? ?Physical Exam ?BP (!) 185/103 (BP Location: Left Arm, Patient Position: Sitting)   Pulse 74   Resp 20   Ht 5\' 11"  (1.803 m)   Wt 163 lb (73.9 kg)   SpO2 97% Comment: RA  BMI 22.41 kg/m?  ?71 year old man in no acute distress ?Lungs clear with equal breath sounds bilaterally ? ?Diagnostic Tests: ?FINAL MICROSCOPIC DIAGNOSIS:  ? ?A. 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 ?B. LUNG, BASILAR LEFT LOWER LOBE, BIOPSY:  ?-  Small cell carcinoma  ?-  See comment  ? ?Impression: ? 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  He presented with 2 new lung nodules.  He underwent navigational bronchoscopy.  Surprisingly pathology showed small cell carcinoma. ? ?This is very limited stage disease.  Will need a multidisciplinary approach.  We will discuss at our multidisciplinary thoracic oncology conference in the morning.  I am going to go ahead and make a referral to Dr. Julien Olson to start that process. ? ?Post biopsy pneumothorax- treated with pigtail catheter, equal breath sounds and no shortness of breath. ? ?Plan: ?Referral to Dr. Julien Olson and discussion in [place] ? ?Kelly Nakayama, MD ?Kelly Olson ?[phone] ? ? ? ?

## 2021-04-21 ENCOUNTER — Encounter: Payer: Self-pay | Admitting: *Deleted

## 2021-04-21 ENCOUNTER — Other Ambulatory Visit: Payer: Self-pay | Admitting: *Deleted

## 2021-04-21 ENCOUNTER — Telehealth: Payer: Self-pay | Admitting: Internal Medicine

## 2021-04-21 DIAGNOSIS — R918 Other nonspecific abnormal finding of lung field: Secondary | ICD-10-CM

## 2021-04-21 NOTE — Progress Notes (Signed)
The proposed treatment discussed in cancer conference 3/23 is for discussion purpose only and is not a binding recommendation.  The patient was not physically examined nor present for their treatment options. Therefore, final treatment plans cannot be decided.  ?

## 2021-04-21 NOTE — Telephone Encounter (Signed)
Scheduled appt per 3/22 referral. Pt is aware of appt date and time. Pt is aware to arrive 15 mins prior to appt time and to bring and updated insurance card. Pt is aware of appt location.   ?

## 2021-04-21 NOTE — Progress Notes (Signed)
Case discussed at cancer conference 3/23, see flow sheet for documentation.  ?

## 2021-04-21 NOTE — Progress Notes (Signed)
Oncology Nurse Navigator Documentation ? ? ?  04/21/2021  ? 10:00 AM 07/20/2016  ?  9:00 AM  ?Oncology Nurse Navigator Flowsheets  ?Abnormal Finding Date 03/23/2021   ?Confirmed Diagnosis Date 04/13/2021   ?Diagnosis Status Pending Molecular Studies   ?Planned Course of Treatment Chemo/Radiation Concurrent   ?Phase of Treatment Radiation   ?Navigator Follow Up Date: 04/27/2021   ?Navigator Follow Up Reason: New Patient Appointment   ?Navigator Location [facility]   ?Referral Date to RadOnc/MedOnc 04/21/2021   ?Navigator Encounter Type Pathology Review;Other: Other  ?Patient Visit Type Other   ?Treatment Phase Pre-Tx/Tx Discussion   ?Barriers/Navigation Needs Coordination of Care/I received referral on mr. Bottcher today.  I notified new patient coordinator to call and schedule him to be seen next week with labs.  Per Dr. Julien Nordmann, I notified pathology to send recent tissue for PDL 1 testing.  Coordination of Care  ?Interventions Coordination of Care Coordination of Care  ?Acuity Level 2-Minimal Needs (1-2 Barriers Identified) Level 1  ?Coordination of Care Other;Pathology Other  ?Time Spent with Patient 45 15  ?  ?

## 2021-04-26 DIAGNOSIS — Z86010 Personal history of colonic polyps: Secondary | ICD-10-CM | POA: Insufficient documentation

## 2021-04-26 DIAGNOSIS — D126 Benign neoplasm of colon, unspecified: Secondary | ICD-10-CM | POA: Insufficient documentation

## 2021-04-26 DIAGNOSIS — Z720 Tobacco use: Secondary | ICD-10-CM | POA: Insufficient documentation

## 2021-04-26 DIAGNOSIS — E041 Nontoxic single thyroid nodule: Secondary | ICD-10-CM | POA: Insufficient documentation

## 2021-04-26 DIAGNOSIS — I700 Atherosclerosis of aorta: Secondary | ICD-10-CM | POA: Insufficient documentation

## 2021-04-26 DIAGNOSIS — Z85118 Personal history of other malignant neoplasm of bronchus and lung: Secondary | ICD-10-CM | POA: Insufficient documentation

## 2021-04-26 DIAGNOSIS — G8929 Other chronic pain: Secondary | ICD-10-CM | POA: Insufficient documentation

## 2021-04-26 DIAGNOSIS — R3129 Other microscopic hematuria: Secondary | ICD-10-CM | POA: Insufficient documentation

## 2021-04-26 DIAGNOSIS — H05249 Constant exophthalmos, unspecified eye: Secondary | ICD-10-CM | POA: Insufficient documentation

## 2021-04-26 DIAGNOSIS — R7301 Impaired fasting glucose: Secondary | ICD-10-CM | POA: Insufficient documentation

## 2021-04-26 DIAGNOSIS — N529 Male erectile dysfunction, unspecified: Secondary | ICD-10-CM | POA: Insufficient documentation

## 2021-04-26 NOTE — Progress Notes (Signed)
Thoracic Location of Tumor / Histology: Small cell carcinoma of left lung ? ?Biopsies: ? ?Dr. Roxan Hockey 04/20/2021 ? ?A. Lung, Left Lower Lobe Superior Segment, BIOPSY:  ?-  Small cellular clusters suspicious for small cell carcinoma  ? ?B. Lung, Basilar Left Lower Lobe, BIOPSY:  ?-  Small cell carcinoma  ? ? ?Tobacco/Marijuana/Snuff/ETOH use: Former smoker (05/29/2014), no drug use and yes to alcohol. ? 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  He presented with 2 new lung nodules left lobe.  He underwent navigational bronchoscopy.  Surprisingly pathology showed small cell carcinoma.  This is very limited stage disease.  Post biopsy pneumothorax- treated with pigtail catheter, equal breath sounds and no shortness of breath. ? ?Past/Anticipated interventions by medical oncology, if any:  ? ?Signs/Symptoms ?Weight changes, if any:  No ?Respiratory complaints, if any:  No ?Hemoptysis, if any: No ?Pain issues, if any:  0/10 ? ?SAFETY ISSUES: ?Prior radiation?  No ?Pacemaker/ICD?  No ?Possible current pregnancy? Male ?Is the patient on methotrexate? No ? ?Current Complaints / other details:    ?

## 2021-04-27 ENCOUNTER — Inpatient Hospital Stay: Payer: Medicare Other

## 2021-04-27 ENCOUNTER — Inpatient Hospital Stay: Payer: Medicare Other | Attending: Internal Medicine | Admitting: Internal Medicine

## 2021-04-27 ENCOUNTER — Other Ambulatory Visit: Payer: Self-pay

## 2021-04-27 VITALS — BP 182/98 | HR 73 | Temp 97.6°F | Resp 18 | Ht 71.0 in | Wt 165.4 lb

## 2021-04-27 DIAGNOSIS — Z902 Acquired absence of lung [part of]: Secondary | ICD-10-CM | POA: Diagnosis not present

## 2021-04-27 DIAGNOSIS — F1090 Alcohol use, unspecified, uncomplicated: Secondary | ICD-10-CM | POA: Diagnosis not present

## 2021-04-27 DIAGNOSIS — C3432 Malignant neoplasm of lower lobe, left bronchus or lung: Secondary | ICD-10-CM | POA: Diagnosis not present

## 2021-04-27 DIAGNOSIS — Z5111 Encounter for antineoplastic chemotherapy: Secondary | ICD-10-CM | POA: Insufficient documentation

## 2021-04-27 DIAGNOSIS — Z87891 Personal history of nicotine dependence: Secondary | ICD-10-CM

## 2021-04-27 DIAGNOSIS — R918 Other nonspecific abnormal finding of lung field: Secondary | ICD-10-CM

## 2021-04-27 DIAGNOSIS — C3492 Malignant neoplasm of unspecified part of left bronchus or lung: Secondary | ICD-10-CM

## 2021-04-27 DIAGNOSIS — C3490 Malignant neoplasm of unspecified part of unspecified bronchus or lung: Secondary | ICD-10-CM

## 2021-04-27 LAB — CBC WITH DIFFERENTIAL (CANCER CENTER ONLY)
Abs Immature Granulocytes: 0.03 10*3/uL (ref 0.00–0.07)
Basophils Absolute: 0.1 10*3/uL (ref 0.0–0.1)
Basophils Relative: 1 %
Eosinophils Absolute: 0.2 10*3/uL (ref 0.0–0.5)
Eosinophils Relative: 2 %
HCT: 44.4 % (ref 39.0–52.0)
Hemoglobin: 14.7 g/dL (ref 13.0–17.0)
Immature Granulocytes: 0 %
Lymphocytes Relative: 16 %
Lymphs Abs: 1.4 10*3/uL (ref 0.7–4.0)
MCH: 31.8 pg (ref 26.0–34.0)
MCHC: 33.1 g/dL (ref 30.0–36.0)
MCV: 96.1 fL (ref 80.0–100.0)
Monocytes Absolute: 0.8 10*3/uL (ref 0.1–1.0)
Monocytes Relative: 10 %
Neutro Abs: 6.3 10*3/uL (ref 1.7–7.7)
Neutrophils Relative %: 71 %
Platelet Count: 263 10*3/uL (ref 150–400)
RBC: 4.62 MIL/uL (ref 4.22–5.81)
RDW: 13.1 % (ref 11.5–15.5)
WBC Count: 8.7 10*3/uL (ref 4.0–10.5)
nRBC: 0 % (ref 0.0–0.2)

## 2021-04-27 LAB — CMP (CANCER CENTER ONLY)
ALT: 22 U/L (ref 0–44)
AST: 25 U/L (ref 15–41)
Albumin: 3.9 g/dL (ref 3.5–5.0)
Alkaline Phosphatase: 69 U/L (ref 38–126)
Anion gap: 6 (ref 5–15)
BUN: 27 mg/dL — ABNORMAL HIGH (ref 8–23)
CO2: 26 mmol/L (ref 22–32)
Calcium: 8.9 mg/dL (ref 8.9–10.3)
Chloride: 104 mmol/L (ref 98–111)
Creatinine: 0.89 mg/dL (ref 0.61–1.24)
GFR, Estimated: >60 mL/min (ref >60)
Glucose, Bld: 87 mg/dL (ref 70–99)
Potassium: 4.3 mmol/L (ref 3.5–5.1)
Sodium: 136 mmol/L (ref 135–145)
Total Bilirubin: 0.6 mg/dL (ref 0.3–1.2)
Total Protein: 7.2 g/dL (ref 6.5–8.1)

## 2021-04-27 MED ORDER — PROCHLORPERAZINE MALEATE 10 MG PO TABS: 10.0000 mg | ORAL_TABLET | Freq: Four times a day (QID) | ORAL | 0 refills | Status: DC | PRN

## 2021-04-27 NOTE — Patient Instructions (Signed)
External Beam Radiation Therapy, Care After ?This sheet gives you information about how to care for yourself after your procedure. Your health care provider may also give you more specific instructions. If you have problems or questions, contact your health care provider. ?What can I expect after the procedure? ?After the procedure, it is common to have: ?Tiredness (fatigue). ?Red, flaking, dry skin in the treated area. ?A sunburn-like rash on the skin in the treated area. ?Itching in the treated area. ?Other side effects may occur, depending on which part of the body was exposed to radiation and how much radiation was used. These side effects may include: ?Hair loss if the radiation therapy was directed to the head. ?Coughing or difficulty swallowing if the radiation therapy was directed to the head, neck, or chest. ?A type of swelling called lymphedema if the radiation therapy was directed to the head, neck, or chest. ?Nausea, vomiting, or diarrhea if the radiation therapy was directed to the abdomen or pelvis. ?Bladder problems, urinating often, or a lowered ability or desire to have sex (sexual dysfunction). These problems may happen if the radiation therapy was directed to the bladder, kidney, or prostate. ?Memory loss and thinking problems (cognitive changes) if the radiation therapy was directed to the brain. ?Some side effects may show up months to years later. However, most side effects are usually temporary and get better over time. It can take up to 3-4 weeks for you to regain your energy or for side effects to get better. ?Follow these instructions at home: ?Radiation therapy affects everyone differently. Some people do not have side effects. You can take steps at home to help prevent or manage side effects. ?Skin care ? ?Wash your skin with a mild soap as told by your health care provider. Do not scrub or rub your skin. Pat yourself dry. ?Use a mild shampoo and be gentle when washing your hair. ?Apply  gentle lotion or cream to the treated area as told by your health care provider. ?Keep the treated area covered when you are outside. Do not expose treated skin to the sun. ?Avoid scratching the treated area. ?Eating and drinking ?Eat small nutritious meals and snacks regularly during the day. ?Choose bland and soft foods that are easy to eat. ?Follow your health care provider's advice on the type and amount of liquids to drink each day. ?If you have diarrhea, drink plenty of clear fluids. ?General instructions ?Do not use a heating pad or a warm cloth to relieve pain in the treated area. ?Take over-the-counter and prescription medicines only as told by your health care provider. ?Try to maintain your weight during treatment. Ask your health care team for tips. ?Keep all follow-up visits as told by your health care provider. This is important. The visits are usually scheduled 6 weeks to 6 months after radiation therapy. They are needed to determine if the radiation therapy worked as expected. ?Contact a health care provider if: ?You have any of the following in the treated area: ?Pain. ?More redness. ?Open skin or blisters. ?You have a fever. ?You have nausea or vomiting that lasts more than 2 days. ?You have diarrhea that lasts longer than 2 days. ?You lose weight without trying to. ?Get help right away if you: ?Are unable to swallow. ?Have difficulty breathing. ?Have severe vomiting or diarrhea. ?Summary ?After this procedure, it is common to have tiredness (fatigue), skin changes, and other side effects, depending on where the radiation therapy was given. ?Some side effects may show  up months to years later. However, most side effects are usually temporary and get better over time. It can take up to 3-4 weeks for you to regain your energy or for side effects to get better. ?Keep all follow-up visits as told by your health care provider. This is important. The visits are usually scheduled 6 weeks to 6 months after  radiation therapy. ?This information is not intended to replace advice given to you by your health care provider. Make sure you discuss any questions you have with your health care provider. ?Document Revised: 07/28/2020 Document Reviewed: 09/30/2018 ?Elsevier Patient Education ? [place]. ?Etoposide, VP-16 capsules ?What is this medication? ?ETOPOSIDE, VP-16 (e toe POE side) is a chemotherapy drug. It is used to treat small cell lung cancer and other cancers. ?This medicine may be used for other purposes; ask your health care provider or pharmacist if you have questions. ?COMMON BRAND NAME(S): VePesid ?What should I tell my care team before I take this medication? ?They need to know if you have any of these conditions: ?infection ?kidney disease ?liver disease ?low blood counts, like low white cell, platelet, or red cell counts ?an unusual or allergic reaction to etoposide, other medicines, foods, dyes, or preservatives ?pregnant or trying to get pregnant ?breast-feeding ?How should I use this medication? ?Take this medicine by mouth with a glass of water. Follow the directions on the prescription label. Do not open, crush, or chew the capsules. It is advisable to wear gloves when handling this medicine. Take your medicine at regular intervals. Do not take it more often than directed. Do not stop taking except on your doctor's advice. ?Talk to your pediatrician regarding the use of this medicine in children. Special care may be needed. ?Overdosage: If you think you have taken too much of this medicine contact a poison control center or emergency room at once. ?NOTE: This medicine is only for you. Do not share this medicine with others. ?What if I miss a dose? ?If you miss a dose, take it as soon as you can. If it is almost time for your next dose, take only that dose. Do not take double or extra doses. ?What may interact with this medication? ?This medicine may interact with the following  medications: ?cyclosporine ?warfarin ?This list may not describe all possible interactions. Give your health care provider a list of all the medicines, herbs, non-prescription drugs, or dietary supplements you use. Also tell them if you smoke, drink alcohol, or use illegal drugs. Some items may interact with your medicine. ?What should I watch for while using this medication? ?Visit your doctor for checks on your progress. This drug may make you feel generally unwell. This is not uncommon, as chemotherapy can affect healthy cells as well as cancer cells. Report any side effects. Continue your course of treatment even though you feel ill unless your doctor tells you to stop. ?In some cases, you may be given additional medicines to help with side effects. Follow all directions for their use. ?Call your doctor or health care professional for advice if you get a fever, chills or sore throat, or other symptoms of a cold or flu. Do not treat yourself. This drug decreases your body's ability to fight infections. Try to avoid being around people who are sick. ?This medicine may increase your risk to bruise or bleed. Call your doctor or health care professional if you notice any unusual bleeding. ?Talk to your doctor about your risk of cancer. You may  be more at risk for certain types of cancers if you take this medicine. ?Do not become pregnant while taking this medicine or for at least 6 months after stopping it. Women should inform their doctor if they wish to become pregnant or think they might be pregnant. Women of child-bearing potential will need to have a negative pregnancy test before starting this medicine. There is a potential for serious side effects to an unborn child. Talk to your health care professional or pharmacist for more information. Do not breast-feed an infant while taking this medicine. ?Men must use a latex condom during sexual contact with a woman while taking this medicine and for at least 4 months  after stopping it. A latex condom is needed even if you have had a vasectomy. Contact your doctor right away if your partner becomes pregnant. Do not donate sperm while taking this medicine and for 4 months after you

## 2021-04-27 NOTE — Progress Notes (Signed)
? ? [place] ?Telephone:([phone]) [phone]   [phone] ? ?CONSULT NOTE ? ?REFERRING PHYSICIAN: Dr. Modesto Charon ? ?REASON FOR CONSULTATION:  ?71 years old white male recently diagnosed with lung cancer. ? ?HPI ?Kelly Olson. is a 71 y.o. male with past medical history significant for coronary artery disease status post CABG in 2016, GERD, hypertension, dyslipidemia, cardiomyopathy.  The patient was also found to have stage Ib (T2a, N0, M0) non-small cell lung cancer, squamous cell carcinoma status post right lower lobe wedge resection on July 10, 2016 with tumor size of 1.2 cm but involvement of the visceral pleura.  He was followed by observation and repeat imaging studies but he developed another right lower lobe lung nodule and he underwent completion of right lower lobectomy on 07/28/2020 and the final pathology was also consistent with stage Ib (T2 a, N0, M0) 1.7 cm non-small cell lung cancer, squamous cell carcinoma with visceral pleural involvement.  The patient continued on observation and close monitoring.  CT scan of the chest without contrast on March 23, 2021 showed enlarging and new left lower lobe pulmonary nodules.  There was a medial left lower lobe subpleural 1.7 x 1.3 cm nodule at the site of a 0.5 mm nodule on the prior exam.  Cephalic to this is subpleural left lower lobe nodule measured 1.3 x 1.3 cm that is new or markedly increased.  Just posterior to this there was a new satellite nodule measuring 0.9 cm.  The patient was seen by Dr. Roxan Hockey and a PET scan was performed on April 06, 2021 and it showed 2 persistent pulmonary nodules in the left lower lobe with hypermetabolic activity and the finding were concerning for bronchogenic carcinoma.  There was no metastatic mediastinal adenopathy and no distant metastatic disease.  On April 13, 2021 the patient underwent electromagnetic navigation bronchoscopy with needle aspiration, brushing and  transbronchial biopsies of the 2 left lower lobe lung nodules under the care of Dr. Roxan Hockey.  The final pathology [id number]) was consistent with small cell lung cancer. By immunohistochemistry, the neoplastic cells are positive for CD56, synaptophysin and weakly positive for chromogranin.  The morphology and immunophenotype is consistent with small cell carcinoma. ?Dr. Roxan Hockey kindly referred the patient to me today for evaluation and recommendation regarding treatment of his condition. ?When seen today the patient is feeling fine with no concerning complaints except for mild cough.  He denied having any chest pain, shortness of breath or hemoptysis.  He denied having any weight loss or night sweats.  He has no nausea, vomiting, diarrhea or constipation.  He has no headache or visual changes.  He has no weight loss or night sweats. ?Family history significant for mother with COPD.  Father had cardiomyopathy and sister had Alzheimer. ?The patient is married and has 1 stepson.  He was accompanied by his wife Kelly Olson.  He is currently retired and used to work at [employer].  The patient has a history of smoking more than 1 pack/day for around 46 years and quit May 29, 2014 before his bypass surgery.  He also drinks a glass of wine every night and no history of drug abuse. ? ? ?HPI ? ?Past Medical History:  ?Diagnosis Date  ? Coronary artery disease, occlusive 06/02/2014  ? Multivessel CAD.mLAD-100%, mRCA 99%, dRCA 100%, mLCX 90%  And EF 35-45%. -->  Referred for CABG; nonischemic Myoview March 2020  ? Former heavy tobacco smoker   ? Quit in April 2016   ?  GERD (gastroesophageal reflux disease)   ? Hyperlipidemia with target LDL less than 70   ? Hypertension   ? Ischemic cardiomyopathy - resolved 05/2014  ? Myoview: EF ~33% with "infarction vs. severe resting ischemia in LAD & RCA territory; b) EF by Cath: 35-45%. c) post CABG Echo 9016: EF 50-55%  ? 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 LIMA-OM, RIMA-LAD,  SVG-Diag, SVG-rPDA  ? 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 right lung lower lobe -> right lower lobe nodule resection June 2018 (VATS); R thoracotomy with RLL Lobectomy for recurrent PET positive cancer.   Stage T2 a, N0  ? ? ?Past Surgical History:  ?Procedure Laterality Date  ? CARDIAC CATHETERIZATION N/A 06/02/2014  ? Procedure: Left Heart Cath and Coronary Angiography;  Surgeon: Leonie Man, MD;  Location: [facility];  Service: Cardiovascular;  mLAD-100%, mRCA 99%, dRCA 100%, mLCX 90%  And EF 35-45%.  ? CORONARY ARTERY BYPASS GRAFT N/A 06/04/2014  ? Procedure: CORONARY ARTERY BYPASS GRAFTING (CABG) times four using bilateral internal mammary arteries and EVH for left  leg saphenous vein;  Surgeon: Gaye Pollack, MD;  Location: [facility];  Service: Open Heart Surgery;  LIMA-OM, RIMA-LAD, SVG-Diag, SVG-rPDA  ? NM MYOVIEW LTD  05/28/2014  ? Pre-CABG:  High Risk Nuclear Stress Test with multivessel distribution ischemia.  -- Severely ischemic Cardiomyoapthy with evidence of at least 2 vessel disease & EF of ~33%.  Images are consistent with either infarction or severe resting ischemia in the LAD & RCA territory.  ? NM MYOVIEW LTD  03/2018  ? EF 50-55%.  Small basal inferior fixed defect consistent with diaphragmatic attenuation.  No ischemia or infarction.  Septal dyskinesis due to post CABG state  ? TEE WITHOUT CARDIOVERSION N/A 06/04/2014  ? Procedure: TRANSESOPHAGEAL ECHOCARDIOGRAM (TEE);  Surgeon: Gaye Pollack, MD;  Location: [facility];  Service: Open Heart Surgery;  Laterality: N/A;  ? THORACOTOMY Right 07/10/2016  ? Procedure: THORACOTOMY;  Surgeon: Gaye Pollack, MD;  Location: [facility];  Service: Thoracic;  Laterality: Right;  ? THORACOTOMY/LOBECTOMY Right 07/29/2018  ? Procedure: THORACOTOMY/RIGHT LOWER LOBECTOMY;  Surgeon: Gaye Pollack, MD;  Location: [facility];  Service: Thoracic;  Laterality: Right;  ? TONSILLECTOMY  1957  ? TRANSTHORACIC ECHOCARDIOGRAM  10/2014  ? Mild  concentric LVH. EF 50-55% with mild HK of basal anteroseptal myocardium. GR 1 DD.  ? VIDEO ASSISTED THORACOSCOPY (VATS)/WEDGE RESECTION Right 07/10/2016  ? Procedure: VIDEO ASSISTED THORACOSCOPY (VATS)/WEDGE RESECTION;  Surgeon: Gaye Pollack, MD;  Location: [facility];  Service: Thoracic;  Laterality: Right;  ? VIDEO BRONCHOSCOPY WITH ENDOBRONCHIAL NAVIGATION N/A 04/13/2021  ? Procedure: VIDEO BRONCHOSCOPY WITH ENDOBRONCHIAL NAVIGATION;  Surgeon: Melrose Nakayama, MD;  Location: [facility];  Service: Thoracic;  Laterality: N/A;  ? ? ?Family History  ?Problem Relation Age of Onset  ? Emphysema Mother   ? Cardiomyopathy Father   ? Healthy Sister   ? Heart attack Maternal Grandfather   ? Healthy Sister   ? Heart disease Paternal Uncle   ? ? ?Social History ?Social History  ? ?Tobacco Use  ? Smoking status: Former  ?  Packs/day: 0.80  ?  Years: 46.00  ?  Pack years: 36.80  ?  Types: Cigarettes  ?  Quit date: 05/29/2014  ?  Years since quitting: 6.9  ? Smokeless tobacco: Never  ?Vaping Use  ? Vaping Use: Never used  ?Substance Use Topics  ? Alcohol  use: Yes  ?  Alcohol/week: 7.0 standard drinks  ?  Types: 7 Glasses of wine per week  ? Drug use: No  ? ? ?No Known Allergies ? ?Current Outpatient Medications  ?Medication Sig Dispense Refill  ? acetaminophen (TYLENOL) 325 MG tablet Take 650 mg by mouth every 6 (six) hours as needed for moderate pain.    ? aspirin 81 MG tablet Take 81 mg by mouth daily.    ? atorvastatin (LIPITOR) 40 MG tablet TAKE 1 TABLET AT BEDTIME 90 tablet 1  ? carvedilol (COREG) 3.125 MG tablet TAKE 2 TABLETS TWICE A DAY WITH     MEALS 360 tablet 1  ? losartan (COZAAR) 25 MG tablet TAKE 1 TABLET DAILY 90 tablet 1  ? OVER THE COUNTER MEDICATION [facility] sleep aid    ? sildenafil (VIAGRA) 50 MG tablet Take 1 tablet (50 mg total) by mouth daily as needed for erectile dysfunction. 10 tablet 5  ? ?No current facility-administered medications for this visit.  ? ? ?Review of Systems ? ?Constitutional: negative ?Eyes:  negative ?Ears, nose, mouth, throat, and face: negative ?Respiratory: negative ?Cardiovascular: negative ?Gastrointestinal: negative ?Genitourinary:negative ?Integument/breast: negative ?Hematologic/lymphatic: nega

## 2021-04-28 ENCOUNTER — Ambulatory Visit
Admission: RE | Admit: 2021-04-28 | Discharge: 2021-04-28 | Disposition: A | Discharge status: Home / Self Care | Payer: Medicare Other | Source: Ambulatory Visit | Attending: Radiation Oncology | Admitting: Radiation Oncology

## 2021-04-28 ENCOUNTER — Ambulatory Visit: Payer: Medicare Other

## 2021-04-28 ENCOUNTER — Ambulatory Visit: Payer: Medicare Other | Admitting: Radiation Oncology

## 2021-04-28 VITALS — BP 140/77 | HR 76 | Temp 97.1°F | Resp 18 | Ht 71.0 in | Wt 164.5 lb

## 2021-04-28 DIAGNOSIS — Z79899 Other long term (current) drug therapy: Secondary | ICD-10-CM | POA: Diagnosis not present

## 2021-04-28 DIAGNOSIS — C3432 Malignant neoplasm of lower lobe, left bronchus or lung: Secondary | ICD-10-CM | POA: Insufficient documentation

## 2021-04-28 DIAGNOSIS — E785 Hyperlipidemia, unspecified: Secondary | ICD-10-CM | POA: Insufficient documentation

## 2021-04-28 DIAGNOSIS — I2510 Atherosclerotic heart disease of native coronary artery without angina pectoris: Secondary | ICD-10-CM | POA: Diagnosis not present

## 2021-04-28 DIAGNOSIS — Z87891 Personal history of nicotine dependence: Secondary | ICD-10-CM | POA: Insufficient documentation

## 2021-04-28 DIAGNOSIS — I255 Ischemic cardiomyopathy: Secondary | ICD-10-CM | POA: Insufficient documentation

## 2021-04-28 DIAGNOSIS — Z7982 Long term (current) use of aspirin: Secondary | ICD-10-CM | POA: Diagnosis not present

## 2021-04-28 DIAGNOSIS — Z510 Encounter for antineoplastic radiation therapy: Secondary | ICD-10-CM | POA: Diagnosis not present

## 2021-04-28 DIAGNOSIS — I10 Essential (primary) hypertension: Secondary | ICD-10-CM | POA: Diagnosis not present

## 2021-04-28 DIAGNOSIS — J432 Centrilobular emphysema: Secondary | ICD-10-CM | POA: Insufficient documentation

## 2021-04-28 DIAGNOSIS — I700 Atherosclerosis of aorta: Secondary | ICD-10-CM | POA: Diagnosis not present

## 2021-04-28 DIAGNOSIS — J939 Pneumothorax, unspecified: Secondary | ICD-10-CM | POA: Diagnosis not present

## 2021-04-28 DIAGNOSIS — C3492 Malignant neoplasm of unspecified part of left bronchus or lung: Secondary | ICD-10-CM

## 2021-04-28 DIAGNOSIS — K219 Gastro-esophageal reflux disease without esophagitis: Secondary | ICD-10-CM | POA: Insufficient documentation

## 2021-04-28 NOTE — Progress Notes (Signed)
?Radiation Oncology         [phone] ?________________________________ ? ?Initial outpatient Consultation ? ?Name: Kelly Olson [facility]. MRN: [id number]  ?Date of Service: 04/28/2021 DOB: 02/28/1950 ? ?WU:[id number], Jenny Reichmann, MD  Curt Bears, MD  ? ?REFERRING PHYSICIAN: Curt Bears, MD ? 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 ? ?  ICD-10-CM   ?1. 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  ? ? ?HISTORY OF PRESENT ILLNESS: Moise Friday. is a 71 y.o. male seen at the request of Dr. Julien Nordmann. 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 He was treated with a RLL wedge resection on 07/10/2016, under the care and direction of Dr. Cyndia Bent. Margins were negative but there was evidence of visceral pleura involvement. He was followed in observation and developed another RLL nodule in 07/2018 so he proceeded with completion of RLL lobectomy on 07/29/18. Surgical pathology confirmed Stage 1b NSCLC, squamous cell carinoma with negative margins and no positive nodes but again, involvement of the visceral pleura. He has continued in observation with serial CT scans showing numerous small bilateral pulmonary nodules without evidence of disease recurrence or progression.  ?More recently, a follow up CT Chest from 03/23/21 showed new and significantly enlarged LLL nodules, highly suspicious for metastatic disease versus metachronous primaries. Multiple other bilateral pulmonary nodules were again noted but appeared stable. He had a PET scan on 04/06/21 for further evaluation and this confirmed 2 persistent LLL pulmonary nodules with hypermetabolic activity, concerning for bronchogenic carcinoma. There was no evidence of malignant lymphadenopathy or distant metastatic disease. He underwent Nav-bronchoscopy under the care of Dr. Roxan Hockey on 04/13/21 with biopsy of the 2 LLL nodules. Final surgical  pathology confirmed small cell carcinoma. His case and imaging studies were presented in a recent multidisciplinary thoracic cancer conference on 04/21/21 and consensus recommendations were to consider chemoradiation. He met with Dr. Julien Nordmann on 04/27/21 to discuss treatment options and is tentatively planning to start systemic chemotherapy next week. Dr. Julien Nordmann also ordered an MRI brain scan to complete his disease staging but this has not yet been scheduled. ? ?The patient was kindly referred to [place] today to discuss the potential role for radiotherapy in the management of his disease. ? ? ?PREVIOUS RADIATION THERAPY: No ? ?PAST MEDICAL HISTORY:  ?Past Medical History:  ?Diagnosis Date  ? Coronary artery disease, occlusive 06/02/2014  ? Multivessel CAD.mLAD-100%, mRCA 99%, dRCA 100%, mLCX 90%  And EF 35-45%. -->  Referred for CABG; nonischemic Myoview March 2020  ? Former heavy tobacco smoker   ? Quit in April 2016   ? GERD (gastroesophageal reflux disease)   ? Hyperlipidemia with target LDL less than 70   ? Hypertension   ? Ischemic cardiomyopathy - resolved 05/2014  ? Myoview: EF ~33% with "infarction vs. severe resting ischemia in LAD & RCA territory; b) EF by Cath: 35-45%. c) post CABG Echo 9016: EF 50-55%  ? 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 LIMA-OM, RIMA-LAD, SVG-Diag, SVG-rPDA  ? 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 right lung lower lobe -> right lower lobe nodule resection June 2018 (VATS); R thoracotomy with RLL Lobectomy for recurrent PET positive cancer.   Stage T2 a, N0  ?   ? ?PAST SURGICAL HISTORY: ?Past Surgical History:  ?Procedure Laterality Date  ? CARDIAC CATHETERIZATION  N/A 06/02/2014  ? Procedure: Left Heart Cath and Coronary Angiography;  Surgeon: Leonie Man, MD;  Location: [facility];  Service: Cardiovascular;  mLAD-100%, mRCA 99%, dRCA 100%, mLCX 90%  And EF 35-45%.  ? CORONARY ARTERY BYPASS GRAFT N/A 06/04/2014  ? Procedure: CORONARY ARTERY BYPASS GRAFTING  (CABG) times four using bilateral internal mammary arteries and EVH for left  leg saphenous vein;  Surgeon: Gaye Pollack, MD;  Location: [facility];  Service: Open Heart Surgery;  LIMA-OM, RIMA-LAD, SVG-Diag, SVG-rPDA  ? NM MYOVIEW LTD  05/28/2014  ? Pre-CABG:  High Risk Nuclear Stress Test with multivessel distribution ischemia.  -- Severely ischemic Cardiomyoapthy with evidence of at least 2 vessel disease & EF of ~33%.  Images are consistent with either infarction or severe resting ischemia in the LAD & RCA territory.  ? NM MYOVIEW LTD  03/2018  ? EF 50-55%.  Small basal inferior fixed defect consistent with diaphragmatic attenuation.  No ischemia or infarction.  Septal dyskinesis due to post CABG state  ? TEE WITHOUT CARDIOVERSION N/A 06/04/2014  ? Procedure: TRANSESOPHAGEAL ECHOCARDIOGRAM (TEE);  Surgeon: Gaye Pollack, MD;  Location: [place];  Service: Open Heart Surgery;  Laterality: N/A;  ? THORACOTOMY Right 07/10/2016  ? Procedure: THORACOTOMY;  Surgeon: Gaye Pollack, MD;  Location: [facility];  Service: Thoracic;  Laterality: Right;  ? THORACOTOMY/LOBECTOMY Right 07/29/2018  ? Procedure: THORACOTOMY/RIGHT LOWER LOBECTOMY;  Surgeon: Gaye Pollack, MD;  Location: [place];  Service: Thoracic;  Laterality: Right;  ? TONSILLECTOMY  1957  ? TRANSTHORACIC ECHOCARDIOGRAM  10/2014  ? Mild concentric LVH. EF 50-55% with mild HK of basal anteroseptal myocardium. GR 1 DD.  ? VIDEO ASSISTED THORACOSCOPY (VATS)/WEDGE RESECTION Right 07/10/2016  ? Procedure: VIDEO ASSISTED THORACOSCOPY (VATS)/WEDGE RESECTION;  Surgeon: Gaye Pollack, MD;  Location: [facility];  Service: Thoracic;  Laterality: Right;  ? VIDEO BRONCHOSCOPY WITH ENDOBRONCHIAL NAVIGATION N/A 04/13/2021  ? Procedure: VIDEO BRONCHOSCOPY WITH ENDOBRONCHIAL NAVIGATION;  Surgeon: Melrose Nakayama, MD;  Location: [place];  Service: Thoracic;  Laterality: N/A;  ? ? ?FAMILY HISTORY:  ?Family History  ?Problem Relation Age of Onset  ? Emphysema Mother   ? Cardiomyopathy Father    ? Healthy Sister   ? Heart attack Maternal Grandfather   ? Healthy Sister   ? Heart disease Paternal Uncle   ? ? ?SOCIAL HISTORY:  ?Social History  ? ?Socioeconomic History  ? Marital status: Married  ?  Spouse name: Not on file  ? Number of children: Not on file  ? Years of education: Not on file  ? Highest education level: Not on file  ?Occupational History  ? Not on file  ?Tobacco Use  ? Smoking status: Former  ?  Packs/day: 0.80  ?  Years: 46.00  ?  Pack years: 36.80  ?  Types: Cigarettes  ?  Quit date: 05/29/2014  ?  Years since quitting: 6.9  ? Smokeless tobacco: Never  ?Vaping Use  ? Vaping Use: Never used  ?Substance and Sexual Activity  ? Alcohol use: Yes  ?  Alcohol/week: 7.0 standard drinks  ?  Types: 7 Glasses of wine per week  ? Drug use: No  ? Sexual activity: Yes  ?Other Topics Concern  ? Not on file  ?Social History Narrative  ? Wife recently diagnosed with early stage breast cancer  ? ?Social Determinants of Health  ? ?Financial Resource Strain: Not on file  ?Food Insecurity: Not on file  ?Transportation  Needs: Not on file  ?Physical Activity: Not on file  ?Stress: Not on file  ?Social Connections: Not on file  ?Intimate Partner Violence: Not on file  ? ? ?ALLERGIES: Patient has no known allergies. ? ?MEDICATIONS:  ?Current Outpatient Medications  ?Medication Sig Dispense Refill  ? acetaminophen (TYLENOL) 325 MG tablet Take 650 mg by mouth every 6 (six) hours as needed for moderate pain. (Patient not taking: Reported on 04/27/2021)    ? aspirin 81 MG tablet Take 81 mg by mouth daily.    ? atorvastatin (LIPITOR) 40 MG tablet TAKE 1 TABLET AT BEDTIME 90 tablet 1  ? carvedilol (COREG) 3.125 MG tablet TAKE 2 TABLETS TWICE A DAY WITH     MEALS 360 tablet 1  ? losartan (COZAAR) 25 MG tablet TAKE 1 TABLET DAILY 90 tablet 1  ? OVER THE COUNTER MEDICATION [employer] sleep aid    ? prochlorperazine (COMPAZINE) 10 MG tablet Take 1 tablet (10 mg total) by mouth every 6 (six) hours as needed for nausea or vomiting. 30  tablet 0  ? sildenafil (VIAGRA) 50 MG tablet Take 1 tablet (50 mg total) by mouth daily as needed for erectile dysfunction. 10 tablet 5  ? ?No current facility-administered medications for this encount

## 2021-04-29 ENCOUNTER — Other Ambulatory Visit: Payer: Self-pay | Admitting: Internal Medicine

## 2021-04-29 ENCOUNTER — Telehealth: Payer: Self-pay | Admitting: Internal Medicine

## 2021-04-29 ENCOUNTER — Encounter: Payer: Self-pay | Admitting: *Deleted

## 2021-04-29 NOTE — Progress Notes (Signed)
Oncology Nurse Navigator Documentation ? ? ?  04/29/2021  ?  1:00 PM 04/21/2021  ? 10:00 AM 07/20/2016  ?  9:00 AM  ?Oncology Nurse Navigator Flowsheets  ?Abnormal Finding Date  03/23/2021   ?Confirmed Diagnosis Date  04/13/2021   ?Diagnosis Status Confirmed Diagnosis Complete Pending Molecular Studies   ?Planned Course of Treatment  Chemo/Radiation Concurrent   ?Phase of Treatment Radiation Radiation   ?Chemotherapy Actual Start Date: 05/04/2021    ?Radiation Actual Start Date: 05/05/2021    ?Navigator Follow Up Date: 05/25/2021 04/27/2021   ?Navigator Follow Up Reason: Follow-up Appointment New Patient Appointment   ?Navigator Location [facility]   ?Referral Date to RadOnc/MedOnc  04/21/2021   ?Navigator Encounter Type Appt/Treatment Plan Review;Initial MedOnc;Clinic/MDC Pathology Review;Other: Other  ?Treatment Initiated Date 05/04/2021    ?Patient Visit Type Other;MedOnc;Initial Other   ?Treatment Phase Pre-Tx/Tx Discussion Pre-Tx/Tx Discussion   ?Barriers/Navigation Needs Coordination of Care;Education Coordination of Care Coordination of Care  ?Education Contractor;Newly Diagnosed Cancer Education/I spoke to patient and his wife this week at his first appt with Dr. Julien Nordmann. Very nice couple. According to Dr. Julien Nordmann, patient is diagnosed with limited stage small cell.  I helped to explain his plan of care and added information to his AVS.  Today, I checked and his plan of care is schedule. Will check on mri brain appt.     ?Interventions Coordination of Care;Education;Psycho-Social Support Coordination of Care Coordination of Care  ?Acuity Level 3-Moderate Needs (3-4 Barriers Identified) Level 2-Minimal Needs (1-2 Barriers Identified) Level 1  ?Coordination of Care Other Other;Pathology Other  ?Education Method Verbal;Other    ?Time Spent with Patient 45 45 15  ?  ?

## 2021-04-29 NOTE — Telephone Encounter (Signed)
Scheduled per 03/29 los, called and spoke with patient's spouse. Patient will be notified of upcoming appointments. ?

## 2021-04-29 NOTE — Progress Notes (Signed)
Oncology Nurse Navigator Documentation ? ? ?  04/29/2021  ?  2:00 PM 04/29/2021  ?  1:00 PM 04/21/2021  ? 10:00 AM 07/20/2016  ?  9:00 AM  ?Oncology Nurse Navigator Flowsheets  ?Abnormal Finding Date   03/23/2021   ?Confirmed Diagnosis Date   04/13/2021   ?Diagnosis Status  Confirmed Diagnosis Complete Pending Molecular Studies   ?Planned Course of Treatment   Chemo/Radiation Concurrent   ?Phase of Treatment  Radiation Radiation   ?Chemotherapy Actual Start Date:  05/04/2021    ?Radiation Actual Start Date:  05/05/2021    ?Navigator Follow Up Date:  05/25/2021 04/27/2021   ?Navigator Follow Up Reason:  Follow-up Appointment New Patient Appointment   ?[facility]igator [facility]   ?Referral Date to RadOnc/MedOnc   04/21/2021   ?Navigator Encounter Type Telephone Appt/Treatment Plan Review;Initial MedOnc;Clinic/MDC Pathology Review;Other: Other  ?Telephone Outgoing Call     ?Treatment Initiated Date  05/04/2021    ?Patient Visit Type Other Other;MedOnc;Initial Other   ?Treatment Phase  Pre-Tx/Tx Discussion Pre-Tx/Tx Discussion   ?Barriers/Navigation Needs Education;Coordination of Care Coordination of Care;Education Coordination of Care Coordination of Care  ?Education Other Actor Options;Newly Diagnosed Cancer Education    ?Interventions Education;Coordination of Care/I followed up on insurance authorization for Mr. stefan markarian brain.  This has been authorized. I called radiology and was given an appt. I called patient with an update.  Coordination of Care;Education;Psycho-Social Support Coordination of Care Coordination of Care  ?Acuity Level 3-Moderate Needs (3-4 Barriers Identified) Level 3-Moderate Needs (3-4 Barriers Identified) Level 2-Minimal Needs (1-2 Barriers Identified) Level 1  ?Coordination of Care Appts Other Other;Pathology Other  ?Education Method Verbal Verbal;Other    ?Time Spent with Patient 45 45 45 15  ?  ?

## 2021-04-29 NOTE — Progress Notes (Signed)
START ON PATHWAY REGIMEN - Small Cell Lung ? ? ?  A cycle is every 21 days: ?    Carboplatin  ?    Etoposide  ? ?**Always confirm dose/schedule in your pharmacy ordering system** ? ?Patient Characteristics: ?Newly Diagnosed, Preoperative or Nonsurgical Candidate (Clinical Staging), First Line, Limited Stage, Nonsurgical Candidate ?Therapeutic Status: Newly Diagnosed, Preoperative or Nonsurgical Candidate (Clinical Staging) ?AJCC T Category: cT3 ?AJCC N Category: cN0 ?AJCC M Category: cM0 ?AJCC 8 Stage Grouping: IIB ?Stage Classification: Limited ?Surgical Candidacy: Nonsurgical Candidate ?Intent of Therapy: ?Curative Intent, Discussed with Patient ?

## 2021-05-01 NOTE — Addendum Note (Signed)
Encounter addended by: Tyler Pita, MD on: 05/01/2021 1:06 PM ? Actions taken: Medication List reviewed, Problem List reviewed, Allergies reviewed

## 2021-05-01 NOTE — Progress Notes (Signed)
?  Radiation Oncology         [phone] ?________________________________ ? ?Name: Kelly Olson [facility]. MRN: [id number]  ?Date: 04/28/2021  DOB: Jan 01, 1951 ? ?SIMULATION AND TREATMENT PLANNING NOTE ? ?  ICD-10-CM   ?1. 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  ? ? 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 ?NARRATIVE:  The patient was brought to the [facility].  Identity was confirmed.  All relevant records and images related to the planned course of therapy were reviewed.  The patient freely provided informed written consent to proceed with treatment after reviewing the details related to the planned course of therapy. The consent form was witnessed and verified by the simulation staff.  Then, the patient was set-up in a stable reproducible  supine position for radiation therapy.  CT images were obtained.  Surface markings were placed.  The CT images were loaded into the planning software.  Then the target and avoidance structures were contoured.  Treatment planning then occurred.  The radiation prescription was entered and confirmed.  Then, I designed and supervised the construction of a total of 6 medically necessary complex treatment devices, including a BodyFix immobilization mold custom fitted to the patient along with 5 multileaf collimators conformally shaped radiation around the treatment target while shielding critical structures such as the heart and spinal cord maximally.  I have requested : 3D Simulation  I have requested a DVH of the following structures: Left lung, right lung, spinal cord, heart, esophagus, and target.  I have ordered:Nutrition Consult ? ?SPECIAL TREATMENT PROCEDURE:  The planned course of therapy using radiation constitutes a special treatment procedure. Special care is required in the management of this patient for the following reasons.  The patient will be receiving concurrent  chemotherapy requiring careful monitoring for increased toxicities of treatment including periodic laboratory values.  The special nature of the planned course of radiotherapy will require increased physician supervision and oversight to ensure patient's safety with optimal treatment outcomes. ? ?PLAN:  The patient will receive 66 Gy in 33 fractions. ? ?________________________________ ? ?Sheral Apley Tammi Klippel, M.D. ? ?

## 2021-05-02 ENCOUNTER — Other Ambulatory Visit: Payer: Self-pay

## 2021-05-02 DIAGNOSIS — C3492 Malignant neoplasm of unspecified part of left bronchus or lung: Secondary | ICD-10-CM

## 2021-05-02 DIAGNOSIS — C3490 Malignant neoplasm of unspecified part of unspecified bronchus or lung: Secondary | ICD-10-CM

## 2021-05-02 NOTE — Addendum Note (Signed)
Encounter addended by: Tyler Pita, MD on: 05/02/2021 12:57 PM ? Actions taken: Medication List reviewed, Problem List reviewed, Allergies reviewed

## 2021-05-03 ENCOUNTER — Inpatient Hospital Stay: Payer: Medicare Other

## 2021-05-03 ENCOUNTER — Other Ambulatory Visit: Payer: Self-pay

## 2021-05-03 DIAGNOSIS — C3492 Malignant neoplasm of unspecified part of left bronchus or lung: Secondary | ICD-10-CM

## 2021-05-03 DIAGNOSIS — C3432 Malignant neoplasm of lower lobe, left bronchus or lung: Secondary | ICD-10-CM | POA: Insufficient documentation

## 2021-05-03 DIAGNOSIS — Z79899 Other long term (current) drug therapy: Secondary | ICD-10-CM | POA: Insufficient documentation

## 2021-05-03 DIAGNOSIS — Z5189 Encounter for other specified aftercare: Secondary | ICD-10-CM | POA: Insufficient documentation

## 2021-05-03 DIAGNOSIS — Z87891 Personal history of nicotine dependence: Secondary | ICD-10-CM | POA: Diagnosis not present

## 2021-05-03 DIAGNOSIS — Z5111 Encounter for antineoplastic chemotherapy: Secondary | ICD-10-CM | POA: Insufficient documentation

## 2021-05-03 LAB — CMP (CANCER CENTER ONLY)
ALT: 17 U/L (ref 0–44)
AST: 20 U/L (ref 15–41)
Albumin: 4.1 g/dL (ref 3.5–5.0)
Alkaline Phosphatase: 75 U/L (ref 38–126)
Anion gap: 6 (ref 5–15)
BUN: 25 mg/dL — ABNORMAL HIGH (ref 8–23)
CO2: 30 mmol/L (ref 22–32)
Calcium: 9.3 mg/dL (ref 8.9–10.3)
Chloride: 105 mmol/L (ref 98–111)
Creatinine: 0.79 mg/dL (ref 0.61–1.24)
GFR, Estimated: >60 mL/min (ref >60)
Glucose, Bld: 97 mg/dL (ref 70–99)
Potassium: 4.4 mmol/L (ref 3.5–5.1)
Sodium: 141 mmol/L (ref 135–145)
Total Bilirubin: 0.6 mg/dL (ref 0.3–1.2)
Total Protein: 7.3 g/dL (ref 6.5–8.1)

## 2021-05-03 LAB — CBC WITH DIFFERENTIAL (CANCER CENTER ONLY)
Abs Immature Granulocytes: 0.01 10*3/uL (ref 0.00–0.07)
Basophils Absolute: 0.1 10*3/uL (ref 0.0–0.1)
Basophils Relative: 1 %
Eosinophils Absolute: 0.1 10*3/uL (ref 0.0–0.5)
Eosinophils Relative: 2 %
HCT: 44.6 % (ref 39.0–52.0)
Hemoglobin: 14.6 g/dL (ref 13.0–17.0)
Immature Granulocytes: 0 %
Lymphocytes Relative: 23 %
Lymphs Abs: 1.3 10*3/uL (ref 0.7–4.0)
MCH: 32.1 pg (ref 26.0–34.0)
MCHC: 32.7 g/dL (ref 30.0–36.0)
MCV: 98 fL (ref 80.0–100.0)
Monocytes Absolute: 0.7 10*3/uL (ref 0.1–1.0)
Monocytes Relative: 12 %
Neutro Abs: 3.3 10*3/uL (ref 1.7–7.7)
Neutrophils Relative %: 62 %
Platelet Count: 260 10*3/uL (ref 150–400)
RBC: 4.55 MIL/uL (ref 4.22–5.81)
RDW: 13.2 % (ref 11.5–15.5)
WBC Count: 5.4 10*3/uL (ref 4.0–10.5)
nRBC: 0 % (ref 0.0–0.2)

## 2021-05-04 ENCOUNTER — Inpatient Hospital Stay: Payer: Medicare Other

## 2021-05-04 VITALS — BP 165/80 | HR 64 | Temp 98.0°F | Resp 18 | Wt 165.2 lb

## 2021-05-04 DIAGNOSIS — C3492 Malignant neoplasm of unspecified part of left bronchus or lung: Secondary | ICD-10-CM

## 2021-05-04 MED ORDER — SODIUM CHLORIDE 0.9 % IV SOLN
100.0000 mg/m2 | Freq: Once | INTRAVENOUS | Status: AC
  Administered 2021-05-04: 190 mg via INTRAVENOUS
  Filled 2021-05-04: qty 9.5

## 2021-05-04 MED ORDER — PALONOSETRON HCL INJECTION 0.25 MG/5ML
0.2500 mg | Freq: Once | INTRAVENOUS | Status: AC
  Administered 2021-05-04: 0.25 mg via INTRAVENOUS
  Filled 2021-05-04: qty 5

## 2021-05-04 MED ORDER — SODIUM CHLORIDE 0.9 % IV SOLN: Freq: Once | INTRAVENOUS | Status: AC

## 2021-05-04 MED ORDER — SODIUM CHLORIDE 0.9 % IV SOLN
10.0000 mg | Freq: Once | INTRAVENOUS | Status: AC
  Administered 2021-05-04: 10 mg via INTRAVENOUS
  Filled 2021-05-04: qty 10

## 2021-05-04 MED ORDER — SODIUM CHLORIDE 0.9 % IV SOLN
487.5000 mg | Freq: Once | INTRAVENOUS | Status: AC
  Administered 2021-05-04: 490 mg via INTRAVENOUS
  Filled 2021-05-04: qty 49

## 2021-05-04 MED ORDER — SODIUM CHLORIDE 0.9 % IV SOLN
150.0000 mg | Freq: Once | INTRAVENOUS | Status: AC
  Administered 2021-05-04: 150 mg via INTRAVENOUS
  Filled 2021-05-04: qty 150

## 2021-05-04 NOTE — Patient Instructions (Signed)
[facility]  Discharge Instructions: ?Thank you for choosing [facility] to provide your oncology and hematology care.  ? ?If you have a lab appointment with the [facility], please go directly to the [facility] and check in at the registration area. ?  ?Wear comfortable clothing and clothing appropriate for easy access to any Portacath or PICC line.  ? ?We strive to give you quality time with your provider. You may need to reschedule your appointment if you arrive late (15 or more minutes).  Arriving late affects you and other patients whose appointments are after yours.  Also, if you miss three or more appointments without notifying the office, you may be dismissed from the clinic at the provider?s discretion.    ?  ?For prescription refill requests, have your pharmacy contact our office and allow 72 hours for refills to be completed.   ? ?Today you received the following chemotherapy and/or immunotherapy agents: Carboplatin (paraplatin), Vepsid (etoposide)    ?  ?To help prevent nausea and vomiting after your treatment, we encourage you to take your nausea medication as directed. ? ?BELOW ARE SYMPTOMS THAT SHOULD BE REPORTED IMMEDIATELY: ?*FEVER GREATER THAN 100.4 F (38 ?C) OR HIGHER ?*CHILLS OR SWEATING ?*NAUSEA AND VOMITING THAT IS NOT CONTROLLED WITH YOUR NAUSEA MEDICATION ?*UNUSUAL SHORTNESS OF BREATH ?*UNUSUAL BRUISING OR BLEEDING ?*URINARY PROBLEMS (pain or burning when urinating, or frequent urination) ?*BOWEL PROBLEMS (unusual diarrhea, constipation, pain near the anus) ?TENDERNESS IN MOUTH AND THROAT WITH OR WITHOUT PRESENCE OF ULCERS (sore throat, sores in mouth, or a toothache) ?UNUSUAL RASH, SWELLING OR PAIN  ?UNUSUAL VAGINAL DISCHARGE OR ITCHING  ? ?Items with * indicate a potential emergency and should be followed up as soon as possible or go to the Emergency Department if any problems should occur. ? ?Please show the CHEMOTHERAPY ALERT CARD or  IMMUNOTHERAPY ALERT CARD at check-in to the Emergency Department and triage nurse. ? ?Should you have questions after your visit or need to cancel or reschedule your appointment, please contact [facility]  Dept: [phone]  and follow the prompts.  Office hours are 8:00 a.m. to 4:30 p.m. Monday - Friday. Please note that voicemails left after 4:00 p.m. may not be returned until the following business day.  We are closed weekends and major holidays. You have access to a nurse at all times for urgent questions. Please call the main number to the clinic Dept: [phone] and follow the prompts. ? ? ?For any non-urgent questions, you may also contact your provider using MyChart. We now offer e-Visits for anyone 23 and older to request care online for non-urgent symptoms. For details visit [email or web address]. ?  ?Also download the MyChart app! Go to the app store, search "MyChart", open the app, select [facility], and log in with your MyChart username and password. ? ?Due to Covid, a mask is required upon entering the hospital/clinic. If you do not have a mask, one will be given to you upon arrival. For doctor visits, patients may have 1 support person aged 80 or older with them. For treatment visits, patients cannot have anyone with them due to current Covid guidelines and our immunocompromised population.  ? ?Carboplatin injection ?What is this medication? ?CARBOPLATIN (KAR boe pla tin) is a chemotherapy drug. It targets fast dividing cells, like cancer cells, and causes these cells to die. This medicine is used to treat ovarian cancer and many other cancers. ?This medicine may be used  for other purposes; ask your health care provider or pharmacist if you have questions. ?COMMON BRAND NAME(S): Paraplatin ?What should I tell my care team before I take this medication? ?They need to know if you have any of these conditions: ?blood disorders ?hearing problems ?kidney disease ?recent  or ongoing radiation therapy ?an unusual or allergic reaction to carboplatin, cisplatin, other chemotherapy, other medicines, foods, dyes, or preservatives ?pregnant or trying to get pregnant ?breast-feeding ?How should I use this medication? ?This drug is usually given as an infusion into a vein. It is administered in a hospital or clinic by a specially trained health care professional. ?Talk to your pediatrician regarding the use of this medicine in children. Special care may be needed. ?Overdosage: If you think you have taken too much of this medicine contact a poison control center or emergency room at once. ?NOTE: This medicine is only for you. Do not share this medicine with others. ?What if I miss a dose? ?It is important not to miss a dose. Call your doctor or health care professional if you are unable to keep an appointment. ?What may interact with this medication? ?medicines for seizures ?medicines to increase blood counts like filgrastim, pegfilgrastim, sargramostim ?some antibiotics like amikacin, gentamicin, neomycin, streptomycin, tobramycin ?vaccines ?Talk to your doctor or health care professional before taking any of these medicines: ?acetaminophen ?aspirin ?ibuprofen ?ketoprofen ?naproxen ?This list may not describe all possible interactions. Give your health care provider a list of all the medicines, herbs, non-prescription drugs, or dietary supplements you use. Also tell them if you smoke, drink alcohol, or use illegal drugs. Some items may interact with your medicine. ?What should I watch for while using this medication? ?Your condition will be monitored carefully while you are receiving this medicine. You will need important blood work done while you are taking this medicine. ?This drug may make you feel generally unwell. This is not uncommon, as chemotherapy can affect healthy cells as well as cancer cells. Report any side effects. Continue your course of treatment even though you feel ill  unless your doctor tells you to stop. ?In some cases, you may be given additional medicines to help with side effects. Follow all directions for their use. ?Call your doctor or health care professional for advice if you get a fever, chills or sore throat, or other symptoms of a cold or flu. Do not treat yourself. This drug decreases your body's ability to fight infections. Try to avoid being around people who are sick. ?This medicine may increase your risk to bruise or bleed. Call your doctor or health care professional if you notice any unusual bleeding. ?Be careful brushing and flossing your teeth or using a toothpick because you may get an infection or bleed more easily. If you have any dental work done, tell your dentist you are receiving this medicine. ?Avoid taking products that contain aspirin, acetaminophen, ibuprofen, naproxen, or ketoprofen unless instructed by your doctor. These medicines may hide a fever. ?Do not become pregnant while taking this medicine. Women should inform their doctor if they wish to become pregnant or think they might be pregnant. There is a potential for serious side effects to an unborn child. Talk to your health care professional or pharmacist for more information. Do not breast-feed an infant while taking this medicine. ?What side effects may I notice from receiving this medication? ?Side effects that you should report to your doctor or health care professional as soon as possible: ?allergic reactions like skin rash, itching  or hives, swelling of the face, lips, or tongue ?signs of infection - fever or chills, cough, sore throat, pain or difficulty passing urine ?signs of decreased platelets or bleeding - bruising, pinpoint red spots on the skin, black, tarry stools, nosebleeds ?signs of decreased red blood cells - unusually weak or tired, fainting spells, lightheadedness ?breathing problems ?changes in hearing ?changes in vision ?chest pain ?high blood pressure ?low blood  counts - This drug may decrease the number of white blood cells, red blood cells and platelets. You may be at increased risk for infections and bleeding. ?nausea and vomiting ?pain, swelling, redness or irritatio

## 2021-05-05 ENCOUNTER — Ambulatory Visit
Admission: RE | Admit: 2021-05-05 | Discharge: 2021-05-05 | Disposition: A | Discharge status: Home / Self Care | Payer: Medicare Other | Source: Ambulatory Visit | Attending: Radiation Oncology | Admitting: Radiation Oncology

## 2021-05-05 ENCOUNTER — Other Ambulatory Visit: Payer: Self-pay

## 2021-05-05 ENCOUNTER — Inpatient Hospital Stay: Payer: Medicare Other

## 2021-05-05 VITALS — BP 170/88 | HR 62 | Temp 98.0°F | Resp 18

## 2021-05-05 DIAGNOSIS — C3492 Malignant neoplasm of unspecified part of left bronchus or lung: Secondary | ICD-10-CM

## 2021-05-05 DIAGNOSIS — Z87891 Personal history of nicotine dependence: Secondary | ICD-10-CM | POA: Diagnosis not present

## 2021-05-05 DIAGNOSIS — Z20822 Contact with and (suspected) exposure to covid-19: Secondary | ICD-10-CM | POA: Diagnosis not present

## 2021-05-05 DIAGNOSIS — C3432 Malignant neoplasm of lower lobe, left bronchus or lung: Secondary | ICD-10-CM | POA: Diagnosis not present

## 2021-05-05 MED ORDER — SODIUM CHLORIDE 0.9 % IV SOLN
100.0000 mg/m2 | Freq: Once | INTRAVENOUS | Status: AC
  Administered 2021-05-05: 190 mg via INTRAVENOUS
  Filled 2021-05-05: qty 9.5

## 2021-05-05 MED ORDER — SODIUM CHLORIDE 0.9 % IV SOLN
10.0000 mg | Freq: Once | INTRAVENOUS | Status: AC
  Administered 2021-05-05: 10 mg via INTRAVENOUS
  Filled 2021-05-05: qty 1
  Filled 2021-05-05: qty 10

## 2021-05-05 MED ORDER — SODIUM CHLORIDE 0.9 % IV SOLN: Freq: Once | INTRAVENOUS | Status: AC

## 2021-05-05 NOTE — Patient Instructions (Signed)
[facility]  Discharge Instructions: ?Thank you for choosing [facility] to provide your oncology and hematology care.  ? ?If you have a lab appointment with the [facility], please go directly to the [facility] and check in at the registration area. ?  ?Wear comfortable clothing and clothing appropriate for easy access to any Portacath or PICC line.  ? ?We strive to give you quality time with your provider. You may need to reschedule your appointment if you arrive late (15 or more minutes).  Arriving late affects you and other patients whose appointments are after yours.  Also, if you miss three or more appointments without notifying the office, you may be dismissed from the clinic at the provider?s discretion.    ?  ?For prescription refill requests, have your pharmacy contact our office and allow 72 hours for refills to be completed.   ? ?Today you received the following chemotherapy and/or immunotherapy agents: Etoposide    ?  ?To help prevent nausea and vomiting after your treatment, we encourage you to take your nausea medication as directed. ? ?BELOW ARE SYMPTOMS THAT SHOULD BE REPORTED IMMEDIATELY: ?*FEVER GREATER THAN 100.4 F (38 ?C) OR HIGHER ?*CHILLS OR SWEATING ?*NAUSEA AND VOMITING THAT IS NOT CONTROLLED WITH YOUR NAUSEA MEDICATION ?*UNUSUAL SHORTNESS OF BREATH ?*UNUSUAL BRUISING OR BLEEDING ?*URINARY PROBLEMS (pain or burning when urinating, or frequent urination) ?*BOWEL PROBLEMS (unusual diarrhea, constipation, pain near the anus) ?TENDERNESS IN MOUTH AND THROAT WITH OR WITHOUT PRESENCE OF ULCERS (sore throat, sores in mouth, or a toothache) ?UNUSUAL RASH, SWELLING OR PAIN  ?UNUSUAL VAGINAL DISCHARGE OR ITCHING  ? ?Items with * indicate a potential emergency and should be followed up as soon as possible or go to the Emergency Department if any problems should occur. ? ?Please show the CHEMOTHERAPY ALERT CARD or IMMUNOTHERAPY ALERT CARD at check-in to  the Emergency Department and triage nurse. ? ?Should you have questions after your visit or need to cancel or reschedule your appointment, please contact [facility]  Dept: [phone]  and follow the prompts.  Office hours are 8:00 a.m. to 4:30 p.m. Monday - Friday. Please note that voicemails left after 4:00 p.m. may not be returned until the following business day.  We are closed weekends and major holidays. You have access to a nurse at all times for urgent questions. Please call the main number to the clinic Dept: [phone] and follow the prompts. ? ? ?For any non-urgent questions, you may also contact your provider using MyChart. We now offer e-Visits for anyone 23 and older to request care online for non-urgent symptoms. For details visit [email or web address]. ?  ?Also download the MyChart app! Go to the app store, search "MyChart", open the app, select [facility], and log in with your MyChart username and password. ? ?Due to Covid, a mask is required upon entering the hospital/clinic. If you do not have a mask, one will be given to you upon arrival. For doctor visits, patients may have 1 support person aged 42 or older with them. For treatment visits, patients cannot have anyone with them due to current Covid guidelines and our immunocompromised population.  ? ?

## 2021-05-06 ENCOUNTER — Ambulatory Visit
Admission: RE | Admit: 2021-05-06 | Discharge: 2021-05-06 | Disposition: A | Discharge status: Home / Self Care | Payer: Medicare Other | Source: Ambulatory Visit | Attending: Radiation Oncology | Admitting: Radiation Oncology

## 2021-05-06 ENCOUNTER — Inpatient Hospital Stay: Payer: Medicare Other

## 2021-05-06 ENCOUNTER — Encounter: Payer: Self-pay | Admitting: *Deleted

## 2021-05-06 VITALS — BP 158/80 | HR 65 | Temp 98.5°F | Resp 17 | Wt 169.0 lb

## 2021-05-06 DIAGNOSIS — Z87891 Personal history of nicotine dependence: Secondary | ICD-10-CM | POA: Diagnosis not present

## 2021-05-06 DIAGNOSIS — Z20822 Contact with and (suspected) exposure to covid-19: Secondary | ICD-10-CM | POA: Diagnosis not present

## 2021-05-06 DIAGNOSIS — C3432 Malignant neoplasm of lower lobe, left bronchus or lung: Secondary | ICD-10-CM | POA: Diagnosis not present

## 2021-05-06 DIAGNOSIS — C3492 Malignant neoplasm of unspecified part of left bronchus or lung: Secondary | ICD-10-CM

## 2021-05-06 DIAGNOSIS — Z510 Encounter for antineoplastic radiation therapy: Secondary | ICD-10-CM | POA: Diagnosis not present

## 2021-05-06 MED ORDER — SODIUM CHLORIDE 0.9 % IV SOLN
10.0000 mg | Freq: Once | INTRAVENOUS | Status: AC
  Administered 2021-05-06: 10 mg via INTRAVENOUS
  Filled 2021-05-06: qty 10
  Filled 2021-05-06: qty 1

## 2021-05-06 MED ORDER — SODIUM CHLORIDE 0.9 % IV SOLN
100.0000 mg/m2 | Freq: Once | INTRAVENOUS | Status: AC
  Administered 2021-05-06: 190 mg via INTRAVENOUS
  Filled 2021-05-06: qty 9.5

## 2021-05-06 MED ORDER — SODIUM CHLORIDE 0.9 % IV SOLN: Freq: Once | INTRAVENOUS | Status: AC

## 2021-05-06 NOTE — Patient Instructions (Signed)
[facility]  Discharge Instructions: ?Thank you for choosing [facility] to provide your oncology and hematology care.  ? ?If you have a lab appointment with the [facility], please go directly to the [facility] and check in at the registration area. ?  ?Wear comfortable clothing and clothing appropriate for easy access to any Portacath or PICC line.  ? ?We strive to give you quality time with your provider. You may need to reschedule your appointment if you arrive late (15 or more minutes).  Arriving late affects you and other patients whose appointments are after yours.  Also, if you miss three or more appointments without notifying the office, you may be dismissed from the clinic at the provider?s discretion.    ?  ?For prescription refill requests, have your pharmacy contact our office and allow 72 hours for refills to be completed.   ? ?Today you received the following chemotherapy and/or immunotherapy agents: Etoposide    ?  ?To help prevent nausea and vomiting after your treatment, we encourage you to take your nausea medication as directed. ? ?BELOW ARE SYMPTOMS THAT SHOULD BE REPORTED IMMEDIATELY: ?*FEVER GREATER THAN 100.4 F (38 ?C) OR HIGHER ?*CHILLS OR SWEATING ?*NAUSEA AND VOMITING THAT IS NOT CONTROLLED WITH YOUR NAUSEA MEDICATION ?*UNUSUAL SHORTNESS OF BREATH ?*UNUSUAL BRUISING OR BLEEDING ?*URINARY PROBLEMS (pain or burning when urinating, or frequent urination) ?*BOWEL PROBLEMS (unusual diarrhea, constipation, pain near the anus) ?TENDERNESS IN MOUTH AND THROAT WITH OR WITHOUT PRESENCE OF ULCERS (sore throat, sores in mouth, or a toothache) ?UNUSUAL RASH, SWELLING OR PAIN  ?UNUSUAL VAGINAL DISCHARGE OR ITCHING  ? ?Items with * indicate a potential emergency and should be followed up as soon as possible or go to the Emergency Department if any problems should occur. ? ?Please show the CHEMOTHERAPY ALERT CARD or IMMUNOTHERAPY ALERT CARD at check-in to  the Emergency Department and triage nurse. ? ?Should you have questions after your visit or need to cancel or reschedule your appointment, please contact [facility]  Dept: [phone]  and follow the prompts.  Office hours are 8:00 a.m. to 4:30 p.m. Monday - Friday. Please note that voicemails left after 4:00 p.m. may not be returned until the following business day.  We are closed weekends and major holidays. You have access to a nurse at all times for urgent questions. Please call the main number to the clinic Dept: [phone] and follow the prompts. ? ? ?For any non-urgent questions, you may also contact your provider using MyChart. We now offer e-Visits for anyone 31 and older to request care online for non-urgent symptoms. For details visit [email or web address]. ?  ?Also download the MyChart app! Go to the app store, search "MyChart", open the app, select [facility], and log in with your MyChart username and password. ? ?Due to Covid, a mask is required upon entering the hospital/clinic. If you do not have a mask, one will be given to you upon arrival. For doctor visits, patients may have 1 support person aged 71 or older with them. For treatment visits, patients cannot have anyone with them due to current Covid guidelines and our immunocompromised population.  ? ?

## 2021-05-06 NOTE — Progress Notes (Signed)
Oncology Nurse Navigator Documentation ? ? ?  05/06/2021  ?  3:00 PM 04/29/2021  ?  2:00 PM 04/29/2021  ?  1:00 PM 04/21/2021  ? 10:00 AM 07/20/2016  ?  9:00 AM  ?Oncology Nurse Navigator Flowsheets  ?Abnormal Finding Date    03/23/2021   ?Confirmed Diagnosis Date    04/13/2021   ?Diagnosis Status   Confirmed Diagnosis Complete Pending Molecular Studies   ?Planned Course of Treatment    Chemo/Radiation Concurrent   ?Phase of Treatment   Radiation Radiation   ?Chemotherapy Actual Start Date:   05/04/2021    ?Radiation Actual Start Date:   05/05/2021    ?Navigator Follow Up Date:   05/25/2021 04/27/2021   ?Navigator Follow Up Reason:   Follow-up Appointment New Patient Appointment   ?Navigator Location [facility]   ?Referral Date to RadOnc/MedOnc    04/21/2021   ?Navigator Encounter Type Appt/Treatment Plan Review Telephone Appt/Treatment Plan Review;Initial MedOnc;Clinic/MDC Pathology Review;Other: Other  ?Telephone  Outgoing Call     ?Treatment Initiated Date   05/04/2021    ?Patient Visit Type Other Other Other;MedOnc;Initial Other   ?Treatment Phase   Pre-Tx/Tx Discussion Pre-Tx/Tx Discussion   ?Barriers/Navigation Needs Coordination of Care/I followed up on Kelly Olson treatment schedule. He is set up at this time.   Education;Coordination of Care Coordination of Care;Education Coordination of Care Coordination of Care  ?Education  Other Actor Options;Newly Diagnosed Cancer Education    ?Interventions Coordination of Care Education;Coordination of Care Coordination of Care;Education;Psycho-Social Support Coordination of Care Coordination of Care  ?Acuity Level 2-Minimal Needs (1-2 Barriers Identified) Level 3-Moderate Needs (3-4 Barriers Identified) Level 3-Moderate Needs (3-4 Barriers Identified) Level 2-Minimal Needs (1-2 Barriers Identified) Level 1  ?Coordination of Care Other Appts Other Other;Pathology Other  ?Education Method  Verbal  Verbal;Other    ?Time Spent with Patient 30 [phone]  ?  ?

## 2021-05-09 ENCOUNTER — Other Ambulatory Visit: Payer: Self-pay | Admitting: Medical Oncology

## 2021-05-09 ENCOUNTER — Ambulatory Visit
Admission: RE | Admit: 2021-05-09 | Discharge: 2021-05-09 | Disposition: A | Discharge status: Home / Self Care | Payer: Medicare Other | Source: Ambulatory Visit | Attending: Radiation Oncology | Admitting: Radiation Oncology

## 2021-05-09 ENCOUNTER — Other Ambulatory Visit: Payer: Self-pay

## 2021-05-09 DIAGNOSIS — C3492 Malignant neoplasm of unspecified part of left bronchus or lung: Secondary | ICD-10-CM | POA: Diagnosis not present

## 2021-05-09 DIAGNOSIS — Z510 Encounter for antineoplastic radiation therapy: Secondary | ICD-10-CM | POA: Diagnosis not present

## 2021-05-09 DIAGNOSIS — C3432 Malignant neoplasm of lower lobe, left bronchus or lung: Secondary | ICD-10-CM | POA: Diagnosis not present

## 2021-05-09 DIAGNOSIS — Z87891 Personal history of nicotine dependence: Secondary | ICD-10-CM | POA: Diagnosis not present

## 2021-05-10 ENCOUNTER — Inpatient Hospital Stay: Payer: Medicare Other

## 2021-05-10 ENCOUNTER — Ambulatory Visit
Admission: RE | Admit: 2021-05-10 | Discharge: 2021-05-10 | Disposition: A | Discharge status: Home / Self Care | Payer: Medicare Other | Source: Ambulatory Visit | Attending: Radiation Oncology | Admitting: Radiation Oncology

## 2021-05-10 ENCOUNTER — Ambulatory Visit (HOSPITAL_COMMUNITY)
Admission: RE | Admit: 2021-05-10 | Discharge: 2021-05-10 | Disposition: A | Discharge status: Home / Self Care | Payer: Medicare Other | Source: Ambulatory Visit | Attending: Internal Medicine | Admitting: Internal Medicine

## 2021-05-10 ENCOUNTER — Inpatient Hospital Stay (HOSPITAL_BASED_OUTPATIENT_CLINIC_OR_DEPARTMENT_OTHER): Payer: Medicare Other | Admitting: Internal Medicine

## 2021-05-10 VITALS — BP 130/72 | HR 91 | Temp 97.2°F | Resp 19 | Ht 71.0 in | Wt 165.8 lb

## 2021-05-10 DIAGNOSIS — C3492 Malignant neoplasm of unspecified part of left bronchus or lung: Secondary | ICD-10-CM

## 2021-05-10 DIAGNOSIS — C3432 Malignant neoplasm of lower lobe, left bronchus or lung: Secondary | ICD-10-CM | POA: Diagnosis not present

## 2021-05-10 DIAGNOSIS — Z5111 Encounter for antineoplastic chemotherapy: Secondary | ICD-10-CM | POA: Diagnosis not present

## 2021-05-10 DIAGNOSIS — C3490 Malignant neoplasm of unspecified part of unspecified bronchus or lung: Secondary | ICD-10-CM | POA: Diagnosis not present

## 2021-05-10 DIAGNOSIS — I6782 Cerebral ischemia: Secondary | ICD-10-CM | POA: Diagnosis not present

## 2021-05-10 DIAGNOSIS — Z510 Encounter for antineoplastic radiation therapy: Secondary | ICD-10-CM | POA: Diagnosis not present

## 2021-05-10 DIAGNOSIS — H0520 Unspecified exophthalmos: Secondary | ICD-10-CM | POA: Diagnosis not present

## 2021-05-10 DIAGNOSIS — Z87891 Personal history of nicotine dependence: Secondary | ICD-10-CM | POA: Diagnosis not present

## 2021-05-10 LAB — CBC WITH DIFFERENTIAL (CANCER CENTER ONLY)
Abs Immature Granulocytes: 0.03 10*3/uL (ref 0.00–0.07)
Basophils Absolute: 0 10*3/uL (ref 0.0–0.1)
Basophils Relative: 1 %
Eosinophils Absolute: 0.2 10*3/uL (ref 0.0–0.5)
Eosinophils Relative: 4 %
HCT: 44.1 % (ref 39.0–52.0)
Hemoglobin: 14.4 g/dL (ref 13.0–17.0)
Immature Granulocytes: 1 %
Lymphocytes Relative: 11 %
Lymphs Abs: 0.6 10*3/uL — ABNORMAL LOW (ref 0.7–4.0)
MCH: 31.6 pg (ref 26.0–34.0)
MCHC: 32.7 g/dL (ref 30.0–36.0)
MCV: 96.7 fL (ref 80.0–100.0)
Monocytes Absolute: 0 10*3/uL — ABNORMAL LOW (ref 0.1–1.0)
Monocytes Relative: 0 %
Neutro Abs: 4.6 10*3/uL (ref 1.7–7.7)
Neutrophils Relative %: 83 %
Platelet Count: 234 10*3/uL (ref 150–400)
RBC: 4.56 MIL/uL (ref 4.22–5.81)
RDW: 12.6 % (ref 11.5–15.5)
Smear Review: NORMAL
WBC Count: 5.6 10*3/uL (ref 4.0–10.5)
nRBC: 0 % (ref 0.0–0.2)

## 2021-05-10 LAB — CMP (CANCER CENTER ONLY)
ALT: 22 U/L (ref 0–44)
AST: 21 U/L (ref 15–41)
Albumin: 4 g/dL (ref 3.5–5.0)
Alkaline Phosphatase: 74 U/L (ref 38–126)
Anion gap: 4 — ABNORMAL LOW (ref 5–15)
BUN: 24 mg/dL — ABNORMAL HIGH (ref 8–23)
CO2: 35 mmol/L — ABNORMAL HIGH (ref 22–32)
Calcium: 9 mg/dL (ref 8.9–10.3)
Chloride: 100 mmol/L (ref 98–111)
Creatinine: 0.94 mg/dL (ref 0.61–1.24)
GFR, Estimated: >60 mL/min (ref >60)
Glucose, Bld: 124 mg/dL — ABNORMAL HIGH (ref 70–99)
Potassium: 4.3 mmol/L (ref 3.5–5.1)
Sodium: 139 mmol/L (ref 135–145)
Total Bilirubin: 0.5 mg/dL (ref 0.3–1.2)
Total Protein: 6.8 g/dL (ref 6.5–8.1)

## 2021-05-10 MED ORDER — GADOBUTROL 1 MMOL/ML IV SOLN
7.0000 mL | Freq: Once | INTRAVENOUS | Status: AC | PRN
  Administered 2021-05-10: 7 mL via INTRAVENOUS

## 2021-05-10 MED ORDER — LIDOCAINE-PRILOCAINE 2.5-2.5 % EX CREA: TOPICAL_CREAM | CUTANEOUS | 0 refills | Status: DC

## 2021-05-10 NOTE — Progress Notes (Signed)
?    [facility] ?[phone]   [phone] ? ?OFFICE PROGRESS NOTE ? ?[name], MD ?[address] ?[place] [address] ? ?DIAGNOSIS: Unresectable limited stage IIb (T3, N0, M0) small cell lung cancer presented with 2 pulmonary nodules in the left lower lobe with no evidence of metastatic disease to the lymph nodes or distant metastasis diagnosed in March 2023 ? ?PRIOR THERAPY: None ? ?CURRENT THERAPY: Systemic chemotherapy with carboplatin for AUC of 5 and etoposide 100 Mg/M2 on days 1, 2 and 3 every 3 weeks.  First dose started 05/02/2021.  Status post 1 cycle.  This is concurrent with radiotherapy under the care of Dr. Tammi Klippel ? ?INTERVAL HISTORY: ?Kelly Olson. 71 y.o. male returns to the clinic today for follow-up visit accompanied by his wife.  The patient is feeling fine today with no concerning complaints.  He tolerated the first week of his treatment with chemotherapy fairly well.  He also started concurrent radiotherapy last week.  He denied having any current chest pain, shortness of breath, cough or hemoptysis.  He denied having any nausea, vomiting, diarrhea or constipation.  He has no headache or visual changes.  He has no weight loss or night sweats.  The patient is here today for evaluation and repeat blood work.  They have rough time finding IV access for him during the first cycle of the treatment and he would like to have a Port-A-Cath placed. ? ?MEDICAL HISTORY: ?Past Medical History:  ?Diagnosis Date  ? Coronary artery disease, occlusive 06/02/2014  ? Multivessel CAD.mLAD-100%, mRCA 99%, dRCA 100%, mLCX 90%  And EF 35-45%. -->  Referred for CABG; nonischemic Myoview March 2020  ? Former heavy tobacco smoker   ? Quit in April 2016   ? GERD (gastroesophageal reflux disease)   ? Hyperlipidemia with target LDL less than 70   ? Hypertension   ? Ischemic cardiomyopathy - resolved 05/2014  ? Myoview: EF ~33% with "infarction vs. severe  resting ischemia in LAD & RCA territory; b) EF by Cath: 35-45%. c) post CABG Echo 9016: EF 50-55%  ? 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 LIMA-OM, RIMA-LAD, SVG-Diag, SVG-rPDA  ? 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 right lung lower lobe -> right lower lobe nodule resection June 2018 (VATS); R thoracotomy with RLL Lobectomy for recurrent PET positive cancer.   Stage T2 a, N0  ? ? ?ALLERGIES:  has No Known Allergies. ? ?MEDICATIONS:  ?Current Outpatient Medications  ?Medication Sig Dispense Refill  ? acetaminophen (TYLENOL) 325 MG tablet Take 650 mg by mouth every 6 (six) hours as needed for moderate pain. (Patient not taking: Reported on 04/27/2021)    ? aspirin 81 MG tablet Take 81 mg by mouth daily.    ? atorvastatin (LIPITOR) 40 MG tablet TAKE 1 TABLET AT BEDTIME 90 tablet 1  ? carvedilol (COREG) 3.125 MG tablet TAKE 2 TABLETS TWICE A DAY WITH     MEALS 360 tablet 1  ? losartan (COZAAR) 25 MG tablet TAKE 1 TABLET DAILY 90 tablet 1  ? OVER THE COUNTER MEDICATION [employer] sleep aid    ? prochlorperazine (COMPAZINE) 10 MG tablet Take 1 tablet (10 mg total) by mouth every 6 (six) hours as needed for nausea or vomiting. 30 tablet 0  ? sildenafil (VIAGRA) 50 MG tablet Take 1 tablet (50 mg total) by mouth daily as needed for erectile dysfunction. 10 tablet 5  ? ?  No current facility-administered medications for this visit.  ? ? ?SURGICAL HISTORY:  ?Past Surgical History:  ?Procedure Laterality Date  ? CARDIAC CATHETERIZATION N/A 06/02/2014  ? Procedure: Left Heart Cath and Coronary Angiography;  Surgeon: Leonie Man, MD;  Location: [facility];  Service: Cardiovascular;  mLAD-100%, mRCA 99%, dRCA 100%, mLCX 90%  And EF 35-45%.  ? CORONARY ARTERY BYPASS GRAFT N/A 06/04/2014  ? Procedure: CORONARY ARTERY BYPASS GRAFTING (CABG) times four using bilateral internal mammary arteries and EVH for left  leg saphenous vein;  Surgeon: Gaye Pollack, MD;  Location: [facility];  Service: Open Heart Surgery;   LIMA-OM, RIMA-LAD, SVG-Diag, SVG-rPDA  ? NM MYOVIEW LTD  05/28/2014  ? Pre-CABG:  High Risk Nuclear Stress Test with multivessel distribution ischemia.  -- Severely ischemic Cardiomyoapthy with evidence of at least 2 vessel disease & EF of ~33%.  Images are consistent with either infarction or severe resting ischemia in the LAD & RCA territory.  ? NM MYOVIEW LTD  03/2018  ? EF 50-55%.  Small basal inferior fixed defect consistent with diaphragmatic attenuation.  No ischemia or infarction.  Septal dyskinesis due to post CABG state  ? TEE WITHOUT CARDIOVERSION N/A 06/04/2014  ? Procedure: TRANSESOPHAGEAL ECHOCARDIOGRAM (TEE);  Surgeon: Gaye Pollack, MD;  Location: [facility];  Service: Open Heart Surgery;  Laterality: N/A;  ? THORACOTOMY Right 07/10/2016  ? Procedure: THORACOTOMY;  Surgeon: Gaye Pollack, MD;  Location[facility];  Service: Thoracic;  Laterality: Right;  ? THORACOTOMY/LOBECTOMY Right 07/29/2018  ? Procedure: THORACOTOMY/RIGHT LOWER LOBECTOMY;  Surgeon: Gaye Pollack, MD;  Location: [facility];  Service: Thoracic;  Laterality: Right;  ? TONSILLECTOMY  1957  ? TRANSTHORACIC ECHOCARDIOGRAM  10/2014  ? Mild concentric LVH. EF 50-55% with mild HK of basal anteroseptal myocardium. GR 1 DD.  ? VIDEO ASSISTED THORACOSCOPY (VATS)/WEDGE RESECTION Right 07/10/2016  ? Procedure: VIDEO ASSISTED THORACOSCOPY (VATS)/WEDGE RESECTION;  Surgeon: Gaye Pollack, MD;  Location: [facility];  Service: Thoracic;  Laterality: Right;  ? VIDEO BRONCHOSCOPY WITH ENDOBRONCHIAL NAVIGATION N/A 04/13/2021  ? Procedure: VIDEO BRONCHOSCOPY WITH ENDOBRONCHIAL NAVIGATION;  Surgeon: Melrose Nakayama, MD;  Location[facility];  Service: Thoracic;  Laterality: N/A;  ? ? ?REVIEW OF SYSTEMS:  A comprehensive review of systems was negative.  ? ?PHYSICAL EXAMINATION: General appearance: alert, cooperative, and no distress ?Head: Normocephalic, without obvious abnormality, atraumatic ?Neck: no adenopathy, no JVD, supple, symmetrical, trachea midline, and  thyroid not enlarged, symmetric, no tenderness/mass/nodules ?Lymph nodes: Cervical, supraclavicular, and axillary nodes normal. ?Resp: clear to auscultation bilaterally ?Back: symmetric, no curvature. ROM normal. No CVA tenderness. ?Cardio: regular rate and rhythm, S1, S2 normal, no murmur, click, rub or gallop ?GI: soft, non-tender; bowel sounds normal; no masses,  no organomegaly ?Extremities: extremities normal, atraumatic, no cyanosis or edema ? ?ECOG PERFORMANCE STATUS: 1 - Symptomatic but completely ambulatory ? ?Blood pressure 130/72, pulse 91, temperature (!) 97.2 ?F (36.2 ?C), temperature source Tympanic, resp. rate 19, height 5\' 11"  (1.803 m), weight 165 lb 12.8 oz (75.2 kg), SpO2 98 %. ? ?LABORATORY DATA: ?Lab Results  ?Component Value Date  ? WBC 5.6 05/10/2021  ? HGB 14.4 05/10/2021  ? HCT 44.1 05/10/2021  ? MCV 96.7 05/10/2021  ? PLT 234 05/10/2021  ? ? ?  Chemistry   ?   ?Component Value Date/Time  ? NA 141 05/03/2021 1049  ? NA 142 04/08/2018 0858  ? K 4.4 05/03/2021 1049  ? CL 105 05/03/2021 1049  ?  CO2 30 05/03/2021 1049  ? BUN 25 (H) 05/03/2021 1049  ? BUN 24 04/08/2018 0858  ? CREATININE 0.79 05/03/2021 1049  ? CREATININE 0.85 01/03/2016 0840  ?    ?Component Value Date/Time  ? CALCIUM 9.3 05/03/2021 1049  ? ALKPHOS 75 05/03/2021 1049  ? AST 20 05/03/2021 1049  ? ALT 17 05/03/2021 1049  ? BILITOT 0.6 05/03/2021 1049  ?  ? ? ? ?RADIOGRAPHIC STUDIES: ?DG Chest 2 View ? ?Result Date: 04/11/2021 ?CLINICAL DATA:  Preadmit for bronchoscopy.  Left lower lobe nodule. EXAM: CHEST - 2 VIEW COMPARISON:  Radiograph 10/23/2018, chest CT 03/23/2021 FINDINGS: Patient is post median sternotomy. The heart is normal in size with normal mediastinal contours. Aortic atherosclerosis. Emphysema with chronic hyperinflation. Left lower lobe pulmonary nodule as seen on prior CT. Many of the additional pulmonary nodules are not visualized by radiograph. No acute airspace disease, pleural effusion, or pneumothorax. Blunting  of the right costophrenic angle is chronic. No acute osseous abnormalities are seen. IMPRESSION: 1. Left lower lobe pulmonary nodule. Many of the additional pulmonary nodules on CT are not seen by radiograph.

## 2021-05-11 ENCOUNTER — Ambulatory Visit
Admission: RE | Admit: 2021-05-11 | Discharge: 2021-05-11 | Disposition: A | Discharge status: Home / Self Care | Payer: Medicare Other | Source: Ambulatory Visit | Attending: Radiation Oncology | Admitting: Radiation Oncology

## 2021-05-11 ENCOUNTER — Other Ambulatory Visit: Payer: Self-pay

## 2021-05-11 DIAGNOSIS — Z87891 Personal history of nicotine dependence: Secondary | ICD-10-CM | POA: Diagnosis not present

## 2021-05-11 DIAGNOSIS — C3492 Malignant neoplasm of unspecified part of left bronchus or lung: Secondary | ICD-10-CM | POA: Diagnosis not present

## 2021-05-11 DIAGNOSIS — C3432 Malignant neoplasm of lower lobe, left bronchus or lung: Secondary | ICD-10-CM | POA: Diagnosis not present

## 2021-05-11 DIAGNOSIS — Z510 Encounter for antineoplastic radiation therapy: Secondary | ICD-10-CM | POA: Diagnosis not present

## 2021-05-12 ENCOUNTER — Ambulatory Visit
Admission: RE | Admit: 2021-05-12 | Discharge: 2021-05-12 | Disposition: A | Discharge status: Home / Self Care | Payer: Medicare Other | Source: Ambulatory Visit | Attending: Radiation Oncology | Admitting: Radiation Oncology

## 2021-05-12 DIAGNOSIS — C3432 Malignant neoplasm of lower lobe, left bronchus or lung: Secondary | ICD-10-CM | POA: Diagnosis not present

## 2021-05-12 DIAGNOSIS — Z510 Encounter for antineoplastic radiation therapy: Secondary | ICD-10-CM | POA: Diagnosis not present

## 2021-05-12 DIAGNOSIS — C3492 Malignant neoplasm of unspecified part of left bronchus or lung: Secondary | ICD-10-CM | POA: Diagnosis not present

## 2021-05-12 DIAGNOSIS — Z87891 Personal history of nicotine dependence: Secondary | ICD-10-CM | POA: Diagnosis not present

## 2021-05-13 ENCOUNTER — Other Ambulatory Visit: Payer: Self-pay | Admitting: Radiology

## 2021-05-13 ENCOUNTER — Other Ambulatory Visit: Payer: Self-pay

## 2021-05-13 ENCOUNTER — Ambulatory Visit
Admission: RE | Admit: 2021-05-13 | Discharge: 2021-05-13 | Disposition: A | Discharge status: Home / Self Care | Payer: Medicare Other | Source: Ambulatory Visit | Attending: Radiation Oncology | Admitting: Radiation Oncology

## 2021-05-13 DIAGNOSIS — Z510 Encounter for antineoplastic radiation therapy: Secondary | ICD-10-CM | POA: Diagnosis not present

## 2021-05-13 DIAGNOSIS — C3432 Malignant neoplasm of lower lobe, left bronchus or lung: Secondary | ICD-10-CM | POA: Diagnosis not present

## 2021-05-13 DIAGNOSIS — C3492 Malignant neoplasm of unspecified part of left bronchus or lung: Secondary | ICD-10-CM | POA: Diagnosis not present

## 2021-05-13 DIAGNOSIS — Z87891 Personal history of nicotine dependence: Secondary | ICD-10-CM | POA: Diagnosis not present

## 2021-05-13 LAB — FUNGUS CULTURE WITH STAIN

## 2021-05-13 LAB — FUNGAL ORGANISM REFLEX

## 2021-05-13 LAB — FUNGUS CULTURE RESULT

## 2021-05-16 ENCOUNTER — Ambulatory Visit
Admission: RE | Admit: 2021-05-16 | Discharge: 2021-05-16 | Disposition: A | Discharge status: Home / Self Care | Payer: Medicare Other | Source: Ambulatory Visit | Attending: Radiation Oncology | Admitting: Radiation Oncology

## 2021-05-16 ENCOUNTER — Other Ambulatory Visit: Payer: Self-pay | Admitting: Internal Medicine

## 2021-05-16 ENCOUNTER — Other Ambulatory Visit: Payer: Self-pay

## 2021-05-16 ENCOUNTER — Encounter (HOSPITAL_COMMUNITY): Payer: Self-pay

## 2021-05-16 ENCOUNTER — Ambulatory Visit (HOSPITAL_COMMUNITY)
Admission: RE | Admit: 2021-05-16 | Discharge: 2021-05-16 | Disposition: A | Discharge status: Home / Self Care | Payer: Medicare Other | Source: Ambulatory Visit | Attending: Internal Medicine | Admitting: Internal Medicine

## 2021-05-16 DIAGNOSIS — I10 Essential (primary) hypertension: Secondary | ICD-10-CM | POA: Insufficient documentation

## 2021-05-16 DIAGNOSIS — I2510 Atherosclerotic heart disease of native coronary artery without angina pectoris: Secondary | ICD-10-CM | POA: Diagnosis not present

## 2021-05-16 DIAGNOSIS — Z951 Presence of aortocoronary bypass graft: Secondary | ICD-10-CM | POA: Diagnosis not present

## 2021-05-16 DIAGNOSIS — C3492 Malignant neoplasm of unspecified part of left bronchus or lung: Secondary | ICD-10-CM | POA: Diagnosis not present

## 2021-05-16 DIAGNOSIS — Z20822 Contact with and (suspected) exposure to covid-19: Secondary | ICD-10-CM | POA: Diagnosis not present

## 2021-05-16 DIAGNOSIS — Z452 Encounter for adjustment and management of vascular access device: Secondary | ICD-10-CM | POA: Diagnosis not present

## 2021-05-16 DIAGNOSIS — Z87891 Personal history of nicotine dependence: Secondary | ICD-10-CM | POA: Diagnosis not present

## 2021-05-16 DIAGNOSIS — E785 Hyperlipidemia, unspecified: Secondary | ICD-10-CM | POA: Diagnosis not present

## 2021-05-16 DIAGNOSIS — C3432 Malignant neoplasm of lower lobe, left bronchus or lung: Secondary | ICD-10-CM | POA: Diagnosis not present

## 2021-05-16 DIAGNOSIS — Z510 Encounter for antineoplastic radiation therapy: Secondary | ICD-10-CM | POA: Diagnosis not present

## 2021-05-16 HISTORY — PX: IR IMAGING GUIDED PORT INSERTION: IMG5740

## 2021-05-16 MED ORDER — MIDAZOLAM HCL 2 MG/2ML IJ SOLN
INTRAMUSCULAR | Status: AC | PRN
  Administered 2021-05-16 (×3): 1 mg via INTRAVENOUS

## 2021-05-16 MED ORDER — LIDOCAINE-EPINEPHRINE 1 %-1:100000 IJ SOLN
INTRAMUSCULAR | Status: AC
  Administered 2021-05-16: 10 mL
  Filled 2021-05-16: qty 1

## 2021-05-16 MED ORDER — MIDAZOLAM HCL 2 MG/2ML IJ SOLN
INTRAMUSCULAR | Status: AC
  Filled 2021-05-16: qty 2

## 2021-05-16 MED ORDER — FENTANYL CITRATE (PF) 100 MCG/2ML IJ SOLN
INTRAMUSCULAR | Status: AC | PRN
  Administered 2021-05-16 (×2): 50 ug via INTRAVENOUS

## 2021-05-16 MED ORDER — SODIUM CHLORIDE 0.9 % IV SOLN: INTRAVENOUS | Status: DC

## 2021-05-16 MED ORDER — HEPARIN SOD (PORK) LOCK FLUSH 100 UNIT/ML IV SOLN
INTRAVENOUS | Status: AC
Start: 2021-05-16 — End: 2021-05-16
  Administered 2021-05-16: 500 [IU]
  Filled 2021-05-16: qty 5

## 2021-05-16 MED ORDER — LIDOCAINE-EPINEPHRINE 1 %-1:100000 IJ SOLN
INTRAMUSCULAR | Status: AC
  Filled 2021-05-16: qty 1

## 2021-05-16 MED ORDER — FENTANYL CITRATE (PF) 100 MCG/2ML IJ SOLN
INTRAMUSCULAR | Status: AC
  Filled 2021-05-16: qty 2

## 2021-05-16 NOTE — Discharge Instructions (Signed)
Please call Interventional Radiology clinic [phone] with any questions or concerns. ? ?You may remove your dressing and shower tomorrow. ? ?DO NOT use EMLA cream on your port site for 2 weeks as this cream will remove surgical glue on your incision. ? ?Implanted Port Insertion, Care After ?This sheet gives you information about how to care for yourself after your procedure. Your health care provider may also give you more specific instructions. If you have problems or questions, contact your health care provider. ?What can I expect after the procedure? ?After the procedure, it is common to have: ?Discomfort at the port insertion site. ?Bruising on the skin over the port. This should improve over 3-4 days. ?Follow these instructions at home: ?Port care ?After your port is placed, you will get a manufacturer's information card. The card has information about your port. Keep this card with you at all times. ?Take care of the port as told by your health care provider. Ask your health care provider if you or a family member can get training for taking care of the port at home. A home health care nurse may also take care of the port. ?Make sure to remember what type of port you have. ?Incision care ?Follow instructions from your health care provider about how to take care of your port insertion site. Make sure you: ?Wash your hands with soap and water before and after you change your bandage (dressing). If soap and water are not available, use hand sanitizer. ?Change your dressing as told by your health care provider. ?Leave stitches (sutures), skin glue, or adhesive strips in place. These skin closures may need to stay in place for 2 weeks or longer. If adhesive strip edges start to loosen and curl up, you may trim the loose edges. Do not remove adhesive strips completely unless your health care provider tells you to do that. ?Check your port insertion site every day for signs of infection. Check for: ?Redness,  swelling, or pain. ?Fluid or blood. ?Warmth. ?Pus or a bad smell.  ? ?  ? ?  ?Activity ?Return to your normal activities as told by your health care provider. Ask your health care provider what activities are safe for you. ?Do not lift anything that is heavier than 10 lb (4.5 kg), or the limit that you are told, until your health care provider says that it is safe. ?General instructions ?Take over-the-counter and prescription medicines only as told by your health care provider. ?Do not take baths, swim, or use a hot tub until your health care provider approves. Ask your health care provider if you may take showers. You may only be allowed to take sponge baths. ?Do not drive for 24 hours if you were given a sedative during your procedure. ?Wear a medical alert bracelet in case of an emergency. This will tell any health care providers that you have a port. ?Keep all follow-up visits as told by your health care provider. This is important. ?Contact a health care provider if: ?You cannot flush your port with saline as directed, or you cannot draw blood from the port. ?You have a fever or chills. ?You have redness, swelling, or pain around your port insertion site. ?You have fluid or blood coming from your port insertion site. ?Your port insertion site feels warm to the touch. ?You have pus or a bad smell coming from the port insertion site. ?Get help right away if: ?You have chest pain or shortness of breath. ?You have bleeding from  your port that you cannot control. ?Summary ?Take care of the port as told by your health care provider. Keep the manufacturer's information card with you at all times. ?Change your dressing as told by your health care provider. ?Contact a health care provider if you have a fever or chills or if you have redness, swelling, or pain around your port insertion site. ?Keep all follow-up visits as told by your health care provider. ?This information is not intended to replace advice given to you  by your health care provider. Make sure you discuss any questions you have with your health care provider. ?Document Revised: 08/14/2017 Document Reviewed: 08/14/2017 ?Elsevier Patient Education ? [place]. ? ? ?Moderate Conscious Sedation, Adult, Care After ?This sheet gives you information about how to care for yourself after your procedure. Your health care provider may also give you more specific instructions. If you have problems or questions, contact your health care provider. ?What can I expect after the procedure? ?After the procedure, it is common to have: ?Sleepiness for several hours. ?Impaired judgment for several hours. ?Difficulty with balance. ?Vomiting if you eat too soon. ?Follow these instructions at home: ?For the time period you were told by your health care provider: ?Rest. ?Do not participate in activities where you could fall or become injured. ?Do not drive or use machinery. ?Do not drink alcohol. ?Do not take sleeping pills or medicines that cause drowsiness. ?Do not make important decisions or sign legal documents. ?Do not take care of children on your own.  ? ?  ? ?  ?Eating and drinking ?Follow the diet recommended by your health care provider. ?Drink enough fluid to keep your urine pale yellow. ?If you vomit: ?Drink water, juice, or soup when you can drink without vomiting. ?Make sure you have little or no nausea before eating solid foods.  ?  ?General instructions ?Take over-the-counter and prescription medicines only as told by your health care provider. ?Have a responsible adult stay with you for the time you are told. It is important to have someone help care for you until you are awake and alert. ?Do not smoke. ?Keep all follow-up visits as told by your health care provider. This is important. ?Contact a health care provider if: ?You are still sleepy or having trouble with balance after 24 hours. ?You feel light-headed. ?You keep feeling nauseous or you keep vomiting. ?You  develop a rash. ?You have a fever. ?You have redness or swelling around the IV site. ?Get help right away if: ?You have trouble breathing. ?You have new-onset confusion at home. ?Summary ?After the procedure, it is common to feel sleepy, have impaired judgment, or feel nauseous if you eat too soon. ?Rest after you get home. Know the things you should not do after the procedure. ?Follow the diet recommended by your health care provider and drink enough fluid to keep your urine pale yellow. ?Get help right away if you have trouble breathing or new-onset confusion at home. ?This information is not intended to replace advice given to you by your health care provider. Make sure you discuss any questions you have with your health care provider. ?Document Revised: 05/16/2019 Document Reviewed: 12/12/2018 ?Elsevier Patient Education ? [place].  ?

## 2021-05-16 NOTE — Procedures (Signed)
?  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 ? ?Postprocedure diagnosis: same ?EBL:    minimal ?Complications:   none immediate ? ?See full dictation in [facility]. ? ?D. Arne Cleveland MD ?Main # [phone] ?Pager  [phone] ?Mobile [phone] ?  ? ?

## 2021-05-16 NOTE — Consult Note (Signed)
? ?Chief Complaint: ?Patient was seen in consultation today for  port a cath placement ? ?Referring Physician(s): ?Mohamed,Mohamed ? ?Supervising Physician: Arne Cleveland ? ?Patient Status: [name] ? ?History of Present Illness: ?Kelly Olson. is a 71 y.o. 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  He is scheduled today for Port-A-Cath placement to assist with treatment. ? ?Past Medical History:  ?Diagnosis Date  ? Coronary artery disease, occlusive 06/02/2014  ? Multivessel CAD.mLAD-100%, mRCA 99%, dRCA 100%, mLCX 90%  And EF 35-45%. -->  Referred for CABG; nonischemic Myoview March 2020  ? Former heavy tobacco smoker   ? Quit in April 2016   ? GERD (gastroesophageal reflux disease)   ? Hyperlipidemia with target LDL less than 70   ? Hypertension   ? Ischemic cardiomyopathy - resolved 05/2014  ? Myoview: EF ~33% with "infarction vs. severe resting ischemia in LAD & RCA territory; b) EF by Cath: 35-45%. c) post CABG Echo 9016: EF 50-55%  ? 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 LIMA-OM, RIMA-LAD, SVG-Diag, SVG-rPDA  ? 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 right lung lower lobe -> right lower lobe nodule resection June 2018 (VATS); R thoracotomy with RLL Lobectomy for recurrent PET positive cancer.   Stage T2 a, N0  ? ? ?Past Surgical History:  ?Procedure Laterality Date  ? CARDIAC CATHETERIZATION N/A 06/02/2014  ? Procedure: Left Heart Cath and Coronary Angiography;  Surgeon: Leonie Man, MD;  Location: [place] [facility];  Service: Cardiovascular;  mLAD-100%, mRCA 99%, dRCA 100%, mLCX 90%  And EF 35-45%.   ? CORONARY ARTERY BYPASS GRAFT N/A 06/04/2014  ? Procedure: CORONARY ARTERY BYPASS GRAFTING (CABG) times four using bilateral internal mammary arteries and EVH for left  leg saphenous vein;  Surgeon: Gaye Pollack, MD;  Location: [facility];  Service: Open Heart Surgery;  LIMA-OM, RIMA-LAD, SVG-Diag, SVG-rPDA  ? NM MYOVIEW LTD  05/28/2014  ? Pre-CABG:  High Risk Nuclear Stress Test with multivessel distribution ischemia.  -- Severely ischemic Cardiomyoapthy with evidence of at least 2 vessel disease & EF of ~33%.  Images are consistent with either infarction or severe resting ischemia in the LAD & RCA territory.  ? NM MYOVIEW LTD  03/2018  ? EF 50-55%.  Small basal inferior fixed defect consistent with diaphragmatic attenuation.  No ischemia or infarction.  Septal dyskinesis due to post CABG state  ? TEE WITHOUT CARDIOVERSION N/A 06/04/2014  ? Procedure: TRANSESOPHAGEAL ECHOCARDIOGRAM (TEE);  Surgeon: Gaye Pollack, MD;  Location: [place];  Service: Open Heart Surgery;  Laterality: N/A;  ? THORACOTOMY Right 07/10/2016  ? Procedure: THORACOTOMY;  Surgeon: Gaye Pollack, MD;  Location: [facility];  Service: Thoracic;  Laterality: Right;  ? THORACOTOMY/LOBECTOMY Right 07/29/2018  ? Procedure: THORACOTOMY/RIGHT LOWER LOBECTOMY;  Surgeon: Gaye Pollack, MD;  Location: [place];  Service: Thoracic;  Laterality: Right;  ? TONSILLECTOMY  1957  ? TRANSTHORACIC ECHOCARDIOGRAM  10/2014  ? Mild concentric LVH. EF 50-55% with mild HK of basal anteroseptal myocardium. GR 1 DD.  ? VIDEO ASSISTED THORACOSCOPY (VATS)/WEDGE RESECTION Right 07/10/2016  ?  Procedure: VIDEO ASSISTED THORACOSCOPY (VATS)/WEDGE RESECTION;  Surgeon: Gaye Pollack, MD;  Location: [facility];  Service: Thoracic;  Laterality: Right;  ? VIDEO BRONCHOSCOPY WITH ENDOBRONCHIAL NAVIGATION N/A 04/13/2021  ? Procedure: VIDEO BRONCHOSCOPY WITH ENDOBRONCHIAL NAVIGATION;  Surgeon: Melrose Nakayama, MD;  Location: [place];  Service: Thoracic;  Laterality: N/A;  ? ? ?Allergies: ?Patient  has no known allergies. ? ?Medications: ?Prior to Admission medications   ?Medication Sig Start Date End Date Taking? Authorizing Provider  ?aspirin 81 MG tablet Take 81 mg by mouth daily.   Yes [provider]  ?atorvastatin (LIPITOR) 40 MG tablet TAKE 1 TABLET AT BEDTIME 04/01/21  Yes Leonie Man, MD  ?carvedilol (COREG) 3.125 MG tablet TAKE 2 TABLETS TWICE A DAY WITH     MEALS 04/01/21  Yes Leonie Man, MD  ?losartan (COZAAR) 25 MG tablet TAKE 1 TABLET DAILY 04/01/21  Yes Leonie Man, MD  ?OVER THE COUNTER MEDICATION [employer] sleep aid   Yes [provider]  ?sildenafil (VIAGRA) 50 MG tablet Take 1 tablet (50 mg total) by mouth daily as needed for erectile dysfunction. 02/23/16  Yes Leonie Man, MD  ?acetaminophen (TYLENOL) 325 MG tablet Take 650 mg by mouth every 6 (six) hours as needed for moderate pain. ?Patient not taking: Reported on 04/27/2021    [provider]  ?lidocaine-prilocaine (EMLA) cream Apply to the Port-A-Cath site 30 minutes before chemotherapy 05/10/21   Curt Bears, MD  ?prochlorperazine (COMPAZINE) 10 MG tablet Take 1 tablet (10 mg total) by mouth every 6 (six) hours as needed for nausea or vomiting. 04/27/21   Curt Bears, MD  ?  ? ?Family History  ?Problem Relation Age of Onset  ? Emphysema Mother   ? Cardiomyopathy Father   ? Healthy Sister   ? Heart attack Maternal Grandfather   ? Healthy Sister   ? Heart disease Paternal Uncle   ? ? ?Social History  ? ?Socioeconomic History  ? Marital status: Married  ?  Spouse name: Not on file  ? Number of children: Not on file  ? Years of education: Not on file  ? Highest education level: Not on file  ?Occupational History  ? Not on file  ?Tobacco Use  ? Smoking status: Former  ?  Packs/day: 0.80  ?  Years: 46.00  ?  Pack years: 36.80  ?  Types: Cigarettes  ?  Quit date: 05/29/2014  ?  Years since quitting: 6.9  ? Smokeless tobacco: Never  ?Vaping Use  ? Vaping Use: Never used  ?Substance and Sexual Activity   ? Alcohol use: Yes  ?  Alcohol/week: 7.0 standard drinks  ?  Types: 7 Glasses of wine per week  ? Drug use: No  ? Sexual activity: Yes  ?Other Topics Concern  ? Not on file  ?Social History Narrative  ? Wife recently diagnosed with early stage breast cancer  ? ?Social Determinants of Health  ? ?Financial Resource Strain: Not on file  ?Food Insecurity: Not on file  ?Transportation Needs: Not on file  ?Physical Activity: Not on file  ?Stress: Not on file  ?Social Connections: Not on file  ? ? ? ? ?Review of Systems denies fever, headache, pain, worsening dyspnea, cough, abdominal/back pain, nausea, vomiting or bleeding. ? ?Vital Signs: ?BP (!) 165/99   Pulse 85   Temp 98.2 ?F (36.8 ?C) (Oral)   Resp 16   Ht 5\' 11"  (1.803 m)   Wt 165 lb 12.8 oz (75.2 kg)  SpO2 94%   BMI 23.12 kg/m?  ? ?Physical Examawake/ alert.  Chest with slightly diminished breath sounds bases.  Heart with regular rate and rhythm.  Abdomen soft, positive bowel sounds, nontender.  No lower extremity edema. ? ?Imaging: ?MR Brain W Wo Contrast ? ?Result Date: 05/10/2021 ?CLINICAL DATA:  Malignant neoplasm of unspecified part of unspecified bronchus or lung (HCC) C34.90 (ICD-10-CM). Small cell lung cancer (SCLC), staging. EXAM: MRI HEAD WITHOUT AND WITH CONTRAST TECHNIQUE: Multiplanar, multiecho pulse sequences of the brain and surrounding structures were obtained without and with intravenous contrast. CONTRAST:  31mL GADAVIST GADOBUTROL 1 MMOL/ML IV SOLN COMPARISON:  MRI of the brain April 07, 2012. FINDINGS: Brain: No acute infarction, hemorrhage, hydrocephalus, extra-axial collection or mass lesion. Scattered foci of T2 hyperintensity are seen within the white matter of the cerebral hemispheres and within the pons, nonspecific, most likely related to chronic small vessel ischemia. No focus of abnormal contrast enhancement identified. Vascular: Normal flow voids. Skull and upper cervical spine: Normal marrow signal. Sinuses/Orbits: Bilateral  exophthalmos. Paranasal sinuses are clear. Trace bilateral mastoid effusion. Other: None. IMPRESSION: 1. No evidence of intracranial metastatic disease. 2. Mild chronic microvascular ischemic changes of the whit

## 2021-05-17 ENCOUNTER — Inpatient Hospital Stay: Payer: Medicare Other

## 2021-05-17 ENCOUNTER — Telehealth: Payer: Self-pay | Admitting: Medical Oncology

## 2021-05-17 ENCOUNTER — Other Ambulatory Visit: Payer: Self-pay | Admitting: Internal Medicine

## 2021-05-17 ENCOUNTER — Other Ambulatory Visit: Payer: Self-pay | Admitting: Pharmacist

## 2021-05-17 ENCOUNTER — Other Ambulatory Visit: Payer: Self-pay

## 2021-05-17 ENCOUNTER — Ambulatory Visit
Admission: RE | Admit: 2021-05-17 | Discharge: 2021-05-17 | Disposition: A | Discharge status: Home / Self Care | Payer: Medicare Other | Source: Ambulatory Visit | Attending: Radiation Oncology | Admitting: Radiation Oncology

## 2021-05-17 VITALS — BP 127/84 | HR 78 | Temp 98.6°F | Resp 16

## 2021-05-17 DIAGNOSIS — C3492 Malignant neoplasm of unspecified part of left bronchus or lung: Secondary | ICD-10-CM

## 2021-05-17 DIAGNOSIS — C3432 Malignant neoplasm of lower lobe, left bronchus or lung: Secondary | ICD-10-CM | POA: Diagnosis not present

## 2021-05-17 DIAGNOSIS — Z510 Encounter for antineoplastic radiation therapy: Secondary | ICD-10-CM | POA: Diagnosis not present

## 2021-05-17 DIAGNOSIS — Z87891 Personal history of nicotine dependence: Secondary | ICD-10-CM | POA: Diagnosis not present

## 2021-05-17 LAB — CMP (CANCER CENTER ONLY)
ALT: 21 U/L (ref 0–44)
AST: 18 U/L (ref 15–41)
Albumin: 3.7 g/dL (ref 3.5–5.0)
Alkaline Phosphatase: 78 U/L (ref 38–126)
Anion gap: 5 (ref 5–15)
BUN: 19 mg/dL (ref 8–23)
CO2: 30 mmol/L (ref 22–32)
Calcium: 8.3 mg/dL — ABNORMAL LOW (ref 8.9–10.3)
Chloride: 103 mmol/L (ref 98–111)
Creatinine: 0.85 mg/dL (ref 0.61–1.24)
GFR, Estimated: >60 mL/min (ref >60)
Glucose, Bld: 136 mg/dL — ABNORMAL HIGH (ref 70–99)
Potassium: 3.7 mmol/L (ref 3.5–5.1)
Sodium: 138 mmol/L (ref 135–145)
Total Bilirubin: 0.4 mg/dL (ref 0.3–1.2)
Total Protein: 6.4 g/dL — ABNORMAL LOW (ref 6.5–8.1)

## 2021-05-17 LAB — RAD ONC ARIA SESSION SUMMARY
Course Elapsed Days: 12
Plan Fractions Treated to Date: 9
Plan Prescribed Dose Per Fraction: 2 Gy
Plan Total Fractions Prescribed: 33
Plan Total Prescribed Dose: 66 Gy
Reference Point Dosage Given to Date: 18 Gy
Reference Point Session Dosage Given: 2 Gy
Session Number: 9

## 2021-05-17 LAB — CBC WITH DIFFERENTIAL (CANCER CENTER ONLY)
Abs Immature Granulocytes: 0 10*3/uL (ref 0.00–0.07)
Basophils Absolute: 0 10*3/uL (ref 0.0–0.1)
Basophils Relative: 1 %
Eosinophils Absolute: 0.1 10*3/uL (ref 0.0–0.5)
Eosinophils Relative: 8 %
HCT: 39.6 % (ref 39.0–52.0)
Hemoglobin: 13.3 g/dL (ref 13.0–17.0)
Immature Granulocytes: 0 %
Lymphocytes Relative: 51 %
Lymphs Abs: 0.4 10*3/uL — ABNORMAL LOW (ref 0.7–4.0)
MCH: 32.6 pg (ref 26.0–34.0)
MCHC: 33.6 g/dL (ref 30.0–36.0)
MCV: 97.1 fL (ref 80.0–100.0)
Monocytes Absolute: 0.1 10*3/uL (ref 0.1–1.0)
Monocytes Relative: 12 %
Neutro Abs: 0.2 10*3/uL — CL (ref 1.7–7.7)
Neutrophils Relative %: 28 %
Platelet Count: 99 10*3/uL — ABNORMAL LOW (ref 150–400)
RBC: 4.08 MIL/uL — ABNORMAL LOW (ref 4.22–5.81)
RDW: 12.2 % (ref 11.5–15.5)
Smear Review: NORMAL
WBC Count: 0.8 10*3/uL — CL (ref 4.0–10.5)
nRBC: 0 % (ref 0.0–0.2)

## 2021-05-17 MED ORDER — FILGRASTIM-SNDZ 300 MCG/0.5ML IJ SOSY
300.0000 ug | PREFILLED_SYRINGE | Freq: Once | INTRAMUSCULAR | Status: AC
  Administered 2021-05-17: 300 ug via SUBCUTANEOUS
  Filled 2021-05-17: qty 0.5

## 2021-05-17 NOTE — Patient Instructions (Signed)
[facility]  Discharge Instructions: Thank you for choosing [facility] to provide your oncology and hematology care.   If you have a lab appointment with the Cancer Center, please go directly to the Cancer Center and check in at the registration area.   Wear comfortable clothing and clothing appropriate for easy access to any Portacath or PICC line.   We strive to give you quality time with your provider. You may need to reschedule your appointment if you arrive late (15 or more minutes).  Arriving late affects you and other patients whose appointments are after yours.  Also, if you miss three or more appointments without notifying the office, you may be dismissed from the clinic at the provider's discretion.      For prescription refill requests, have your pharmacy contact our office and allow 72 hours for refills to be completed.       To help prevent nausea and vomiting after your treatment, we encourage you to take your nausea medication as directed.  BELOW ARE SYMPTOMS THAT SHOULD BE REPORTED IMMEDIATELY: *FEVER GREATER THAN 100.4 F (38 C) OR HIGHER *CHILLS OR SWEATING *NAUSEA AND VOMITING THAT IS NOT CONTROLLED WITH YOUR NAUSEA MEDICATION *UNUSUAL SHORTNESS OF BREATH *UNUSUAL BRUISING OR BLEEDING *URINARY PROBLEMS (pain or burning when urinating, or frequent urination) *BOWEL PROBLEMS (unusual diarrhea, constipation, pain near the anus) TENDERNESS IN MOUTH AND THROAT WITH OR WITHOUT PRESENCE OF ULCERS (sore throat, sores in mouth, or a toothache) UNUSUAL RASH, SWELLING OR PAIN  UNUSUAL VAGINAL DISCHARGE OR ITCHING   Items with * indicate a potential emergency and should be followed up as soon as possible or go to the Emergency Department if any problems should occur.  Please show the CHEMOTHERAPY ALERT CARD or IMMUNOTHERAPY ALERT CARD at check-in to the Emergency Department and triage nurse.  Should you have questions after your  visit or need to cancel or reschedule your appointment, please contact [facility]  Dept: [phone]  and follow the prompts.  Office hours are 8:00 a.m. to 4:30 p.m. Monday - Friday. Please note that voicemails left after 4:00 p.m. may not be returned until the following business day.  We are closed weekends and major holidays. You have access to a nurse at all times for urgent questions. Please call the main number to the clinic Dept: [phone] and follow the prompts.   For any non-urgent questions, you may also contact your provider using MyChart. We now offer e-Visits for anyone 18 and older to request care online for non-urgent symptoms. For details visit [email or web address].   Also download the MyChart app! Go to the app store, search "MyChart", open the app, select , and log in with your MyChart username and password.  Due to Covid, a mask is required upon entering the hospital/clinic. If you do not have a mask, one will be given to you upon arrival. For doctor visits, patients may have 1 support person aged 18 or older with them. For treatment visits, patients cannot have anyone with them due to current Covid guidelines and our immunocompromised population.   

## 2021-05-17 NOTE — Telephone Encounter (Addendum)
CRITICAL VALUE STICKER ? ?CRITICAL VALUE: ?WBC=0.8 and ANC=0.2 ? ?RECEIVER (on-site recipient of call):Meah Jiron ? ?DATE & TIME NOTIFIED: 05/17/21 @ 0842 ? ?MESSENGER (representative from lab): ?Pam ? ?MD NOTIFIED: Julien Nordmann, MD ? ?TIME OF NOTIFICATION: 9030 ? ?RESPONSE:   ?GCSF ? ?

## 2021-05-17 NOTE — Telephone Encounter (Signed)
Pt notified of immunocompromised state. Schedule message sent for xarzio x 3 days per [facility].  ?

## 2021-05-18 ENCOUNTER — Other Ambulatory Visit: Payer: Self-pay

## 2021-05-18 ENCOUNTER — Telehealth: Payer: Self-pay | Admitting: Internal Medicine

## 2021-05-18 ENCOUNTER — Ambulatory Visit
Admission: RE | Admit: 2021-05-18 | Discharge: 2021-05-18 | Disposition: A | Discharge status: Home / Self Care | Payer: Medicare Other | Source: Ambulatory Visit | Attending: Radiation Oncology | Admitting: Radiation Oncology

## 2021-05-18 ENCOUNTER — Inpatient Hospital Stay: Payer: Medicare Other

## 2021-05-18 DIAGNOSIS — C3492 Malignant neoplasm of unspecified part of left bronchus or lung: Secondary | ICD-10-CM

## 2021-05-18 DIAGNOSIS — Z510 Encounter for antineoplastic radiation therapy: Secondary | ICD-10-CM | POA: Diagnosis not present

## 2021-05-18 DIAGNOSIS — C3432 Malignant neoplasm of lower lobe, left bronchus or lung: Secondary | ICD-10-CM | POA: Diagnosis not present

## 2021-05-18 DIAGNOSIS — Z87891 Personal history of nicotine dependence: Secondary | ICD-10-CM | POA: Diagnosis not present

## 2021-05-18 LAB — RAD ONC ARIA SESSION SUMMARY
Course Elapsed Days: 13
Plan Fractions Treated to Date: 10
Plan Prescribed Dose Per Fraction: 2 Gy
Plan Total Fractions Prescribed: 33
Plan Total Prescribed Dose: 66 Gy
Reference Point Dosage Given to Date: 20 Gy
Reference Point Session Dosage Given: 2 Gy
Session Number: 10

## 2021-05-18 MED ORDER — FILGRASTIM-SNDZ 300 MCG/0.5ML IJ SOSY
300.0000 ug | PREFILLED_SYRINGE | Freq: Once | INTRAMUSCULAR | Status: AC
  Administered 2021-05-18: 300 ug via SUBCUTANEOUS
  Filled 2021-05-18: qty 0.5

## 2021-05-18 NOTE — Telephone Encounter (Signed)
Called patient regarding upcoming appointments, patient is notified. 

## 2021-05-18 NOTE — Patient Instructions (Signed)
Filgrastim, G-CSF injection ?What is this medication? ?FILGRASTIM, G-CSF (fil GRA stim) is a granulocyte colony-stimulating factor that stimulates the growth of neutrophils, a type of white blood cell (WBC) important in the body's fight against infection. It is used to reduce the incidence of fever and infection in patients with certain types of cancer who are receiving chemotherapy that affects the bone marrow, to stimulate blood cell production for removal of WBCs from the body prior to a bone marrow transplantation, to reduce the incidence of fever and infection in patients who have severe chronic neutropenia, and to improve survival outcomes following high-dose radiation exposure that is toxic to the bone marrow. ?This medicine may be used for other purposes; ask your health care provider or pharmacist if you have questions. ?COMMON BRAND NAME(S): Neupogen, Nivestym, Releuko, Zarxio ?What should I tell my care team before I take this medication? ?They need to know if you have any of these conditions: ?kidney disease ?latex allergy ?ongoing radiation therapy ?sickle cell disease ?an unusual or allergic reaction to filgrastim, pegfilgrastim, other medicines, foods, dyes, or preservatives ?pregnant or trying to get pregnant ?breast-feeding ?How should I use this medication? ?This medicine is for injection under the skin or infusion into a vein. As an infusion into a vein, it is usually given by a health care professional in a hospital or clinic setting. If you get this medicine at home, you will be taught how to prepare and give this medicine. Refer to the Instructions for Use that come with your medication packaging. Use exactly as directed. Take your medicine at regular intervals. Do not take your medicine more often than directed. ?It is important that you put your used needles and syringes in a special sharps container. Do not put them in a trash can. If you do not have a sharps container, call your pharmacist  or healthcare provider to get one. ?Talk to your pediatrician regarding the use of this medicine in children. While this drug may be prescribed for children as young as 7 months for selected conditions, precautions do apply. ?Overdosage: If you think you have taken too much of this medicine contact a poison control center or emergency room at once. ?NOTE: This medicine is only for you. Do not share this medicine with others. ?What if I miss a dose? ?It is important not to miss your dose. Call your doctor or health care professional if you miss a dose. ?What may interact with this medication? ?This medicine may interact with the following medications: ?medicines that may cause a release of neutrophils, such as lithium ?This list may not describe all possible interactions. Give your health care provider a list of all the medicines, herbs, non-prescription drugs, or dietary supplements you use. Also tell them if you smoke, drink alcohol, or use illegal drugs. Some items may interact with your medicine. ?What should I watch for while using this medication? ?Your condition will be monitored carefully while you are receiving this medicine. ?You may need blood work done while you are taking this medicine. ?Talk to your health care provider about your risk of cancer. You may be more at risk for certain types of cancer if you take this medicine. ?What side effects may I notice from receiving this medication? ?Side effects that you should report to your doctor or health care professional as soon as possible: ?allergic reactions like skin rash, itching or hives, swelling of the face, lips, or tongue ?back pain ?dizziness or feeling faint ?fever ?pain, redness, or  irritation at site where injected ?pinpoint red spots on the skin ?shortness of breath or breathing problems ?signs and symptoms of kidney injury like trouble passing urine, change in the amount of urine, or red or dark-brown urine ?stomach or side pain, or pain at  the shoulder ?swelling ?tiredness ?unusual bleeding or bruising ?Side effects that usually do not require medical attention (report to your doctor or health care professional if they continue or are bothersome): ?bone pain ?cough ?diarrhea ?hair loss ?headache ?muscle pain ?This list may not describe all possible side effects. Call your doctor for medical advice about side effects. You may report side effects to FDA at 1-800-FDA-1088. ?Where should I keep my medication? ?Keep out of the reach of children. ?Store in a refrigerator between 2 and 8 degrees C (36 and 46 degrees F). Do not freeze. Keep in carton to protect from light. Throw away this medicine if vials or syringes are left out of the refrigerator for more than 24 hours. Throw away any unused medicine after the expiration date. ?NOTE: This sheet is a summary. It may not cover all possible information. If you have questions about this medicine, talk to your doctor, pharmacist, or health care provider. ?? 2023 Elsevier/Gold Standard (2020-12-17 00:00:00) ? ?

## 2021-05-19 ENCOUNTER — Inpatient Hospital Stay: Payer: Medicare Other

## 2021-05-19 ENCOUNTER — Other Ambulatory Visit: Payer: Self-pay

## 2021-05-19 ENCOUNTER — Ambulatory Visit
Admission: RE | Admit: 2021-05-19 | Discharge: 2021-05-19 | Disposition: A | Discharge status: Home / Self Care | Payer: Medicare Other | Source: Ambulatory Visit | Attending: Radiation Oncology | Admitting: Radiation Oncology

## 2021-05-19 VITALS — BP 123/73 | HR 76 | Temp 98.5°F | Resp 18

## 2021-05-19 DIAGNOSIS — C3492 Malignant neoplasm of unspecified part of left bronchus or lung: Secondary | ICD-10-CM

## 2021-05-19 DIAGNOSIS — Z510 Encounter for antineoplastic radiation therapy: Secondary | ICD-10-CM | POA: Diagnosis not present

## 2021-05-19 DIAGNOSIS — C3432 Malignant neoplasm of lower lobe, left bronchus or lung: Secondary | ICD-10-CM | POA: Diagnosis not present

## 2021-05-19 DIAGNOSIS — Z87891 Personal history of nicotine dependence: Secondary | ICD-10-CM | POA: Diagnosis not present

## 2021-05-19 LAB — RAD ONC ARIA SESSION SUMMARY
Course Elapsed Days: 14
Plan Fractions Treated to Date: 11
Plan Prescribed Dose Per Fraction: 2 Gy
Plan Total Fractions Prescribed: 33
Plan Total Prescribed Dose: 66 Gy
Reference Point Dosage Given to Date: 22 Gy
Reference Point Session Dosage Given: 2 Gy
Session Number: 11

## 2021-05-19 MED ORDER — FILGRASTIM-SNDZ 300 MCG/0.5ML IJ SOSY
300.0000 ug | PREFILLED_SYRINGE | Freq: Once | INTRAMUSCULAR | Status: AC
  Administered 2021-05-19: 300 ug via SUBCUTANEOUS
  Filled 2021-05-19: qty 0.5

## 2021-05-20 ENCOUNTER — Other Ambulatory Visit: Payer: Self-pay

## 2021-05-20 ENCOUNTER — Other Ambulatory Visit: Payer: Self-pay | Admitting: Radiation Oncology

## 2021-05-20 ENCOUNTER — Ambulatory Visit
Admission: RE | Admit: 2021-05-20 | Discharge: 2021-05-20 | Disposition: A | Discharge status: Home / Self Care | Payer: Medicare Other | Source: Ambulatory Visit | Attending: Radiation Oncology | Admitting: Radiation Oncology

## 2021-05-20 DIAGNOSIS — Z510 Encounter for antineoplastic radiation therapy: Secondary | ICD-10-CM | POA: Diagnosis not present

## 2021-05-20 DIAGNOSIS — C3432 Malignant neoplasm of lower lobe, left bronchus or lung: Secondary | ICD-10-CM | POA: Diagnosis not present

## 2021-05-20 DIAGNOSIS — Z87891 Personal history of nicotine dependence: Secondary | ICD-10-CM | POA: Diagnosis not present

## 2021-05-20 DIAGNOSIS — C3492 Malignant neoplasm of unspecified part of left bronchus or lung: Secondary | ICD-10-CM | POA: Diagnosis not present

## 2021-05-20 LAB — RAD ONC ARIA SESSION SUMMARY
Course Elapsed Days: 15
Plan Fractions Treated to Date: 12
Plan Prescribed Dose Per Fraction: 2 Gy
Plan Total Fractions Prescribed: 33
Plan Total Prescribed Dose: 66 Gy
Reference Point Dosage Given to Date: 24 Gy
Reference Point Session Dosage Given: 2 Gy
Session Number: 12

## 2021-05-20 MED ORDER — SUCRALFATE 1 G PO TABS: 1.0000 g | ORAL_TABLET | Freq: Three times a day (TID) | ORAL | 2 refills | Status: DC

## 2021-05-20 NOTE — Progress Notes (Signed)
[facility] ?OFFICE PROGRESS NOTE ? ?Kelly Orn, MD ?[address] ?[place] [address] ? ?DIAGNOSIS: Unresectable limited stage IIb (T3, N0, M0) small cell lung cancer presented with 2 pulmonary nodules in the left lower lobe with no evidence of metastatic disease to the lymph nodes or distant metastasis diagnosed in March 2023 ? ?PRIOR THERAPY: None ? ?CURRENT THERAPY:  Systemic chemotherapy with carboplatin for AUC of 5 and etoposide 100 Mg/M2 on days 1, 2 and 3 every 3 weeks.  First dose started 05/02/2021.  Status post 1 cycle.  This is concurrent with radiotherapy under the care of Dr. Tammi Klippel ? ?INTERVAL HISTORY: ?[name]. 71 y.o. male returns to the clinic today for a follow-up visit accompanied by his wife.  The patient was recently diagnosed with small cell lung cancer.  In the interval since his last appointment, he had a Port-A-Cath placed due to difficult IV access.  The patient tolerated this well.  The patient also had some neutropenia on weekly labs and receive Zarxio injections x3.  The patient was seen in the emergency room at [facility] a few days ago for erythema and firmness on his right forearm at a prior IV site.  The patient was diagnosed with phlebitis.  He was given a prescription for Bactrim but he has not taken this yet as he was unsure if he should take this with chemotherapy.  He also takes an 81 mg aspirin.  Overall, his phlebitis is similar to how it was a few days ago. ? ? The patient is feeling fine today with no concerning complaints, ever since receiving his Zarxio injections last week he feels like he gets a little more winded with exertion, such as climbing the stairs, compared to prior.  The patient denies any unusual cough besides his occasional cough secondary to sinus drainage.  He also is undergoing radiation to the lung.  He denies any fever, chills, night sweats, or unexplained weight loss the patient denies any hemoptysis or  chest pain.  Denies any nausea, vomiting, diarrhea, or constipation.  Denies any headache or visual changes. The patient is here today for evaluation and repeat blood work before starting cycle #2.  ? ? ? ?MEDICAL HISTORY: ?Past Medical History:  ?Diagnosis Date  ? Coronary artery disease, occlusive 06/02/2014  ? Multivessel CAD.mLAD-100%, mRCA 99%, dRCA 100%, mLCX 90%  And EF 35-45%. -->  Referred for CABG; nonischemic Myoview March 2020  ? Former heavy tobacco smoker   ? Quit in April 2016   ? GERD (gastroesophageal reflux disease)   ? Hyperlipidemia with target LDL less than 70   ? Hypertension   ? Ischemic cardiomyopathy - resolved 05/2014  ? Myoview: EF ~33% with "infarction vs. severe resting ischemia in LAD & RCA territory; b) EF by Cath: 35-45%. c) post CABG Echo 9016: EF 50-55%  ? 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 LIMA-OM, RIMA-LAD, SVG-Diag, SVG-rPDA  ? 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 right lung lower lobe -> right lower lobe nodule resection June 2018 (VATS); R thoracotomy with RLL Lobectomy for recurrent PET positive cancer.   Stage T2 a, N0  ? ? ?ALLERGIES:  has No Known Allergies. ? ?MEDICATIONS:  ?Current Outpatient Medications  ?Medication Sig Dispense Refill  ? acetaminophen (TYLENOL) 325 MG tablet Take 650 mg by mouth every 6 (six) hours as needed for moderate pain. (Patient not taking: Reported on 04/27/2021)    ?  aspirin 81 MG tablet Take 81 mg by mouth daily.    ? atorvastatin (LIPITOR) 40 MG tablet TAKE 1 TABLET AT BEDTIME 90 tablet 1  ? carvedilol (COREG) 3.125 MG tablet TAKE 2 TABLETS TWICE A DAY WITH     MEALS 360 tablet 1  ? lidocaine-prilocaine (EMLA) cream Apply to the Port-A-Cath site 30 minutes before chemotherapy 30 g 0  ? losartan (COZAAR) 25 MG tablet TAKE 1 TABLET DAILY 90 tablet 1  ? OVER THE COUNTER MEDICATION [employer] sleep aid    ? prochlorperazine (COMPAZINE) 10 MG tablet Take 1 tablet (10 mg total) by mouth every 6 (six) hours as needed for nausea or  vomiting. 30 tablet 0  ? sildenafil (VIAGRA) 50 MG tablet Take 1 tablet (50 mg total) by mouth daily as needed for erectile dysfunction. 10 tablet 5  ? sucralfate (CARAFATE) 1 g tablet Take 1 tablet (1 g total) by mouth 4 (four) times daily -  with meals and at bedtime. 5 min before meals for radiation induced esophagitis 120 tablet 2  ? sulfamethoxazole-trimethoprim (BACTRIM DS) 800-160 MG tablet Take 1 tablet by mouth 2 (two) times daily for 5 days. 10 tablet 0  ? ?No current facility-administered medications for this visit.  ? ? ?SURGICAL HISTORY:  ?Past Surgical History:  ?Procedure Laterality Date  ? CARDIAC CATHETERIZATION N/A 06/02/2014  ? Procedure: Left Heart Cath and Coronary Angiography;  Surgeon: Leonie Man, MD;  Location: [facility];  Service: Cardiovascular;  mLAD-100%, mRCA 99%, dRCA 100%, mLCX 90%  And EF 35-45%.  ? CORONARY ARTERY BYPASS GRAFT N/A 06/04/2014  ? Procedure: CORONARY ARTERY BYPASS GRAFTING (CABG) times four using bilateral internal mammary arteries and EVH for left  leg saphenous vein;  Surgeon: Gaye Pollack, MD;  Location: [facility];  Service: Open Heart Surgery;  LIMA-OM, RIMA-LAD, SVG-Diag, SVG-rPDA  ? IR IMAGING GUIDED PORT INSERTION  05/16/2021  ? NM MYOVIEW LTD  05/28/2014  ? Pre-CABG:  High Risk Nuclear Stress Test with multivessel distribution ischemia.  -- Severely ischemic Cardiomyoapthy with evidence of at least 2 vessel disease & EF of ~33%.  Images are consistent with either infarction or severe resting ischemia in the LAD & RCA territory.  ? NM MYOVIEW LTD  03/2018  ? EF 50-55%.  Small basal inferior fixed defect consistent with diaphragmatic attenuation.  No ischemia or infarction.  Septal dyskinesis due to post CABG state  ? TEE WITHOUT CARDIOVERSION N/A 06/04/2014  ? Procedure: TRANSESOPHAGEAL ECHOCARDIOGRAM (TEE);  Surgeon: Gaye Pollack, MD;  Location: [facility];  Service: Open Heart Surgery;  Laterality: N/A;  ? THORACOTOMY Right 07/10/2016  ? Procedure:  THORACOTOMY;  Surgeon: Gaye Pollack, MD;  Location: [facility] OR;  Service: Thoracic;  Laterality: Right;  ? THORACOTOMY/LOBECTOMY Right 07/29/2018  ? Procedure: THORACOTOMY/RIGHT LOWER LOBECTOMY;  Surgeon: Gaye Pollack, MD;  Location: [facility];  Service: Thoracic;  Laterality: Right;  ? TONSILLECTOMY  1957  ? TRANSTHORACIC ECHOCARDIOGRAM  10/2014  ? Mild concentric LVH. EF 50-55% with mild HK of basal anteroseptal myocardium. GR 1 DD.  ? VIDEO ASSISTED THORACOSCOPY (VATS)/WEDGE RESECTION Right 07/10/2016  ? Procedure: VIDEO ASSISTED THORACOSCOPY (VATS)/WEDGE RESECTION;  Surgeon: Gaye Pollack, MD;  Location: [facility];  Service: Thoracic;  Laterality: Right;  ? VIDEO BRONCHOSCOPY WITH ENDOBRONCHIAL NAVIGATION N/A 04/13/2021  ? Procedure: VIDEO BRONCHOSCOPY WITH ENDOBRONCHIAL NAVIGATION;  Surgeon: Melrose Nakayama, MD;  Location: [facility];  Service: Thoracic;  Laterality: N/A;  ? ? ?REVIEW OF  SYSTEMS:   ?Review of Systems  ?Constitutional: Negative for appetite change, chills, fatigue, fever and unexpected weight change.  ?HENT: Negative for mouth sores, nosebleeds, sore throat and trouble swallowing.   ?Eyes: Negative for eye problems and icterus.  ?Respiratory: Positive for dyspnea with certain activities.  Negative for cough, hemoptysis, and wheezing.   ?Cardiovascular: Negative for chest pain and leg swelling.  ?Gastrointestinal: Negative for abdominal pain, constipation, diarrhea, nausea and vomiting.  ?Genitourinary: Negative for bladder incontinence, difficulty urinating, dysuria, frequency and hematuria.   ?Musculoskeletal: Negative for back pain, gait problem, neck pain and neck stiffness.  ?Skin: Positive for erythema and firmness along the right forearm at prior IV site. ?Neurological: Negative for dizziness, extremity weakness, gait problem, headaches, light-headedness and seizures.  ?Hematological: Negative for adenopathy. Does not bruise/bleed easily.  ?Psychiatric/Behavioral: Negative for confusion,  depression and sleep disturbance. The patient is not nervous/anxious.   ? ? ?PHYSICAL EXAMINATION:  ?Blood pressure (!) 157/88, pulse 88, temperature (!) 96.7 ?F (35.9 ?C), temperature source Tympanic, resp. rate 18, heig

## 2021-05-22 ENCOUNTER — Other Ambulatory Visit: Payer: Self-pay

## 2021-05-22 ENCOUNTER — Encounter (HOSPITAL_BASED_OUTPATIENT_CLINIC_OR_DEPARTMENT_OTHER): Payer: Self-pay

## 2021-05-22 ENCOUNTER — Emergency Department (HOSPITAL_BASED_OUTPATIENT_CLINIC_OR_DEPARTMENT_OTHER)
Admission: EM | Admit: 2021-05-22 | Discharge: 2021-05-22 | Disposition: A | Discharge status: Home / Self Care | Payer: Medicare Other | Attending: Emergency Medicine | Admitting: Emergency Medicine

## 2021-05-22 DIAGNOSIS — C3490 Malignant neoplasm of unspecified part of unspecified bronchus or lung: Secondary | ICD-10-CM | POA: Diagnosis not present

## 2021-05-22 DIAGNOSIS — I8001 Phlebitis and thrombophlebitis of superficial vessels of right lower extremity: Secondary | ICD-10-CM | POA: Diagnosis not present

## 2021-05-22 DIAGNOSIS — Z7982 Long term (current) use of aspirin: Secondary | ICD-10-CM | POA: Diagnosis not present

## 2021-05-22 DIAGNOSIS — M79631 Pain in right forearm: Secondary | ICD-10-CM | POA: Diagnosis present

## 2021-05-22 DIAGNOSIS — I808 Phlebitis and thrombophlebitis of other sites: Secondary | ICD-10-CM | POA: Diagnosis not present

## 2021-05-22 DIAGNOSIS — I809 Phlebitis and thrombophlebitis of unspecified site: Secondary | ICD-10-CM

## 2021-05-22 MED ORDER — SULFAMETHOXAZOLE-TRIMETHOPRIM 800-160 MG PO TABS: 1.0000 | ORAL_TABLET | Freq: Two times a day (BID) | ORAL | 0 refills | Status: AC

## 2021-05-22 NOTE — Discharge Instructions (Addendum)
It was a pleasure taking care of you today! ? ?You will be sent a prescription for bactrim, take as prescribed.  You may also take over-the-counter 600 mg ibuprofen as needed for your symptoms.  Call your primary care provider to set up a follow-up appointment regarding today's ED visit.  Return to the emergency department for experiencing increasing/worsening pain, swelling, redness, fever, worsening symptoms. ?

## 2021-05-22 NOTE — ED Provider Notes (Signed)
?[facility] EMERGENCY DEPT ?Provider Note ? ? ?CSN: [id number] ?Arrival date & time: 05/22/21  1050 ? ?  ? ?History ? ?Chief Complaint  ?Patient presents with  ? Arm Pain  ? ? ?Kelly Olson. is a 71 y.o. male with a past medical history of small cell lung carcinoma who presents to the emergency department complaining of redness and pain to his right forearm onset yesterday.  Patient notes that he is currently receiving chemotherapy and radiation therapy.  He notes that his WBC was low and he received 3 injections of Zarxio to increase his WBC on 4/18, 4/19, 4/20.  Patient is right-hand dominant.  Patient notes his last round of chemotherapy was 3 weeks ago and his pain and swelling is at the site where they gave him the infusion.  Denies fever, chills.  Denies allergies to medications. ? ?The history is provided by the patient. No language interpreter was used.  ? ?  ? ?Home Medications ?Prior to Admission medications   ?Medication Sig Start Date End Date Taking? Authorizing Provider  ?sulfamethoxazole-trimethoprim (BACTRIM DS) 800-160 MG tablet Take 1 tablet by mouth 2 (two) times daily for 5 days. 05/22/21 05/27/21 Yes Quantina Dershem A, PA-C  ?acetaminophen (TYLENOL) 325 MG tablet Take 650 mg by mouth every 6 (six) hours as needed for moderate pain. ?Patient not taking: Reported on 04/27/2021    [provider]  ?aspirin 81 MG tablet Take 81 mg by mouth daily.    [provider]  ?atorvastatin (LIPITOR) 40 MG tablet TAKE 1 TABLET AT BEDTIME 04/01/21   Leonie Man, MD  ?carvedilol (COREG) 3.125 MG tablet TAKE 2 TABLETS TWICE A DAY WITH     MEALS 04/01/21   Leonie Man, MD  ?lidocaine-prilocaine (EMLA) cream Apply to the Port-A-Cath site 30 minutes before chemotherapy 05/10/21   Curt Bears, MD  ?losartan (COZAAR) 25 MG tablet TAKE 1 TABLET DAILY 04/01/21   Leonie Man, MD  ?OVER THE COUNTER MEDICATION [employer] sleep aid    [provider]  ?prochlorperazine  (COMPAZINE) 10 MG tablet Take 1 tablet (10 mg total) by mouth every 6 (six) hours as needed for nausea or vomiting. 04/27/21   Curt Bears, MD  ?sildenafil (VIAGRA) 50 MG tablet Take 1 tablet (50 mg total) by mouth daily as needed for erectile dysfunction. 02/23/16   Leonie Man, MD  ?sucralfate (CARAFATE) 1 g tablet Take 1 tablet (1 g total) by mouth 4 (four) times daily -  with meals and at bedtime. 5 min before meals for radiation induced esophagitis 05/20/21   Tyler Pita, MD  ?   ? ?Allergies    ?Patient has no known allergies.   ? ?Review of Systems   ?Review of Systems  ?Constitutional:  Negative for chills and fever.  ?Musculoskeletal:  Positive for arthralgias. Negative for joint swelling.  ?Skin:  Positive for color change. Negative for wound.  ?All other systems reviewed and are negative. ? ?Physical Exam ?Updated Vital Signs ?BP 131/85   Pulse 76   Temp 97.7 ?F (36.5 ?C)   Resp 16   Ht 5\' 11"  (1.803 m)   Wt 74.8 kg   SpO2 97%   BMI 23.01 kg/m?  ?Physical Exam ?Vitals and nursing note reviewed.  ?Constitutional:   ?   General: He is not in acute distress. ?   Appearance: He is not diaphoretic.  ?HENT:  ?   Head: Normocephalic and atraumatic.  ?   Mouth/Throat:  ?  Pharynx: No oropharyngeal exudate.  ?Eyes:  ?   General: No scleral icterus. ?   Conjunctiva/sclera: Conjunctivae normal.  ?Cardiovascular:  ?   Rate and Rhythm: Normal rate and regular rhythm.  ?   Pulses: Normal pulses.  ?   Heart sounds: Normal heart sounds.  ?Pulmonary:  ?   Effort: Pulmonary effort is normal. No respiratory distress.  ?   Breath sounds: Normal breath sounds. No wheezing.  ?Abdominal:  ?   General: There is no distension.  ?Musculoskeletal:     ?   General: Normal range of motion.  ?   Cervical back: Normal range of motion and neck supple.  ?   Comments: Area of erythema and small area of induration noted to right forearm.  No obvious drainage noted.  Mild increased warmth to the area.  Radial pulse  intact.  Sensation intact.  No tenderness to palpation noted to right olecranon, wrist, hand, phalanges.  ?Skin: ?   General: Skin is warm and dry.  ?Neurological:  ?   Mental Status: He is alert.  ?Psychiatric:     ?   Behavior: Behavior normal.  ? ? ?ED Results / Procedures / Treatments   ?Labs ?(all labs ordered are listed, but only abnormal results are displayed) ?Labs Reviewed - No data to display ? ?EKG ?None ? ?Radiology ?No results found. ? ?Procedures ?Procedures  ? ? ?Medications Ordered in ED ?Medications - No data to display ? ?ED Course/ Medical Decision Making/ A&P ?Clinical Course as of 05/22/21 1330  ?Sun May 22, 2021  ?1329 Discussed discharge treatment plan. Answered all available questions. Pt appears safe for discharge. [SB]  ?  ?Clinical Course User Index ?[SB] Wayne Brunker A, PA-C  ? ?                        ?Medical Decision Making ?Risk ?Prescription drug management. ? ? ?Pt presents with right forearm pain/swelling/redness onset yesterday. Pt received chemotherapy to the site ~3 weeks ago. Vitals stable, pt afebrile. On exam, pt with Area of erythema and small area of induration noted to right forearm.  No obvious drainage noted.  Mild increased warmth to the area.  Radial pulse intact.  Sensation intact.  No tenderness to palpation noted to right olecranon, wrist, hand, phalanges. No acute cardiovascular, respiratory, abdominal exam findings. Differential diagnosis includes phlebitis, cellulitis, abscess, venous thrombosis.  ? ? ?Disposition: ?Pt presentation suspicious for phlebitis versus cellulitis, likely phlebitis in the setting up recent chemotherapy. Doubt abscess or venous thrombosis at this time. After consideration of the diagnostic results and the patients response to treatment, I feel that the patient would benefit from Discharge home. Pt will be giving a prescription for Bactrim and instructed to follow up with his radiation and medical oncologist regarding todays ED visit.  Supportive care measures and strict return precautions discussed with patient at bedside. Pt acknowledges and verbalizes understanding. Pt appears safe for discharge. Follow up as indicated in discharge paperwork.  ? ? ?This chart was dictated using voice recognition software, Dragon. Despite the best efforts of this provider to proofread and correct errors, errors may still occur which can change documentation meaning. ? ?Final Clinical Impression(s) / ED Diagnoses ?Final diagnoses:  ?Phlebitis  ? ? ?Rx / DC Orders ?ED Discharge Orders   ? ?      Ordered  ?  sulfamethoxazole-trimethoprim (BACTRIM DS) 800-160 MG tablet  2 times daily       ?  05/22/21 1326  ? ?  ?  ? ?  ? ? ?  ?Addalyne Vandehei A, PA-C ?05/22/21 1342 ? ?  ?Jeanell Sparrow, DO ?05/23/21 [phone] ? ?

## 2021-05-22 NOTE — ED Triage Notes (Signed)
Pt presents with arm pain, swelling, and redness to R forearm following a chemotherapy infusion 3 weeks prior. No lesion present. 1+ radial pulse and brisk cap refill distally.  ?

## 2021-05-23 ENCOUNTER — Other Ambulatory Visit: Payer: Self-pay

## 2021-05-23 ENCOUNTER — Telehealth: Payer: Self-pay | Admitting: Internal Medicine

## 2021-05-23 ENCOUNTER — Ambulatory Visit
Admission: RE | Admit: 2021-05-23 | Discharge: 2021-05-23 | Disposition: A | Discharge status: Home / Self Care | Payer: Medicare Other | Source: Ambulatory Visit | Attending: Radiation Oncology | Admitting: Radiation Oncology

## 2021-05-23 DIAGNOSIS — Z87891 Personal history of nicotine dependence: Secondary | ICD-10-CM | POA: Diagnosis not present

## 2021-05-23 DIAGNOSIS — C3492 Malignant neoplasm of unspecified part of left bronchus or lung: Secondary | ICD-10-CM | POA: Diagnosis not present

## 2021-05-23 DIAGNOSIS — Z510 Encounter for antineoplastic radiation therapy: Secondary | ICD-10-CM | POA: Diagnosis not present

## 2021-05-23 DIAGNOSIS — C3432 Malignant neoplasm of lower lobe, left bronchus or lung: Secondary | ICD-10-CM | POA: Diagnosis not present

## 2021-05-23 LAB — RAD ONC ARIA SESSION SUMMARY
Course Elapsed Days: 18
Plan Fractions Treated to Date: 13
Plan Prescribed Dose Per Fraction: 2 Gy
Plan Total Fractions Prescribed: 33
Plan Total Prescribed Dose: 66 Gy
Reference Point Dosage Given to Date: 26 Gy
Reference Point Session Dosage Given: 2 Gy
Session Number: 13

## 2021-05-23 NOTE — Telephone Encounter (Signed)
Called patient regarding upcoming appointments, patient is notified. 

## 2021-05-24 ENCOUNTER — Other Ambulatory Visit: Payer: Self-pay

## 2021-05-24 ENCOUNTER — Inpatient Hospital Stay: Payer: Medicare Other

## 2021-05-24 ENCOUNTER — Inpatient Hospital Stay (HOSPITAL_BASED_OUTPATIENT_CLINIC_OR_DEPARTMENT_OTHER): Payer: Medicare Other | Admitting: Physician Assistant

## 2021-05-24 ENCOUNTER — Ambulatory Visit
Admission: RE | Admit: 2021-05-24 | Discharge: 2021-05-24 | Disposition: A | Discharge status: Home / Self Care | Payer: Medicare Other | Source: Ambulatory Visit | Attending: Radiation Oncology | Admitting: Radiation Oncology

## 2021-05-24 VITALS — BP 157/88 | HR 88 | Temp 96.7°F | Resp 18 | Ht 71.0 in | Wt 164.3 lb

## 2021-05-24 DIAGNOSIS — Z5111 Encounter for antineoplastic chemotherapy: Secondary | ICD-10-CM

## 2021-05-24 DIAGNOSIS — C3492 Malignant neoplasm of unspecified part of left bronchus or lung: Secondary | ICD-10-CM

## 2021-05-24 DIAGNOSIS — Z87891 Personal history of nicotine dependence: Secondary | ICD-10-CM | POA: Diagnosis not present

## 2021-05-24 DIAGNOSIS — C3432 Malignant neoplasm of lower lobe, left bronchus or lung: Secondary | ICD-10-CM | POA: Diagnosis not present

## 2021-05-24 DIAGNOSIS — Z510 Encounter for antineoplastic radiation therapy: Secondary | ICD-10-CM | POA: Diagnosis not present

## 2021-05-24 LAB — CMP (CANCER CENTER ONLY)
ALT: 17 U/L (ref 0–44)
AST: 17 U/L (ref 15–41)
Albumin: 4 g/dL (ref 3.5–5.0)
Alkaline Phosphatase: 105 U/L (ref 38–126)
Anion gap: 5 (ref 5–15)
BUN: 21 mg/dL (ref 8–23)
CO2: 30 mmol/L (ref 22–32)
Calcium: 9 mg/dL (ref 8.9–10.3)
Chloride: 104 mmol/L (ref 98–111)
Creatinine: 0.87 mg/dL (ref 0.61–1.24)
GFR, Estimated: >60 mL/min (ref >60)
Glucose, Bld: 148 mg/dL — ABNORMAL HIGH (ref 70–99)
Potassium: 4 mmol/L (ref 3.5–5.1)
Sodium: 139 mmol/L (ref 135–145)
Total Bilirubin: 0.3 mg/dL (ref 0.3–1.2)
Total Protein: 6.8 g/dL (ref 6.5–8.1)

## 2021-05-24 LAB — RAD ONC ARIA SESSION SUMMARY
Course Elapsed Days: 19
Plan Fractions Treated to Date: 14
Plan Prescribed Dose Per Fraction: 2 Gy
Plan Total Fractions Prescribed: 33
Plan Total Prescribed Dose: 66 Gy
Reference Point Dosage Given to Date: 28 Gy
Reference Point Session Dosage Given: 2 Gy
Session Number: 14

## 2021-05-24 LAB — CBC WITH DIFFERENTIAL (CANCER CENTER ONLY)
Abs Immature Granulocytes: 0.45 10*3/uL — ABNORMAL HIGH (ref 0.00–0.07)
Basophils Absolute: 0.1 10*3/uL (ref 0.0–0.1)
Basophils Relative: 1 %
Eosinophils Absolute: 0.1 10*3/uL (ref 0.0–0.5)
Eosinophils Relative: 1 %
HCT: 42.2 % (ref 39.0–52.0)
Hemoglobin: 14.1 g/dL (ref 13.0–17.0)
Immature Granulocytes: 7 %
Lymphocytes Relative: 8 %
Lymphs Abs: 0.5 10*3/uL — ABNORMAL LOW (ref 0.7–4.0)
MCH: 32.2 pg (ref 26.0–34.0)
MCHC: 33.4 g/dL (ref 30.0–36.0)
MCV: 96.3 fL (ref 80.0–100.0)
Monocytes Absolute: 0.9 10*3/uL (ref 0.1–1.0)
Monocytes Relative: 13 %
Neutro Abs: 4.7 10*3/uL (ref 1.7–7.7)
Neutrophils Relative %: 70 %
Platelet Count: 195 10*3/uL (ref 150–400)
RBC: 4.38 MIL/uL (ref 4.22–5.81)
RDW: 12.9 % (ref 11.5–15.5)
WBC Count: 6.8 10*3/uL (ref 4.0–10.5)
nRBC: 0 % (ref 0.0–0.2)

## 2021-05-24 MED ORDER — SODIUM CHLORIDE 0.9 % IV SOLN
150.0000 mg | Freq: Once | INTRAVENOUS | Status: AC
  Administered 2021-05-24: 150 mg via INTRAVENOUS
  Filled 2021-05-24: qty 150

## 2021-05-24 MED ORDER — SODIUM CHLORIDE 0.9% FLUSH
10.0000 mL | INTRAVENOUS | Status: DC | PRN
  Administered 2021-05-24: 10 mL

## 2021-05-24 MED ORDER — PALONOSETRON HCL INJECTION 0.25 MG/5ML
0.2500 mg | Freq: Once | INTRAVENOUS | Status: AC
  Administered 2021-05-24: 0.25 mg via INTRAVENOUS
  Filled 2021-05-24: qty 5

## 2021-05-24 MED ORDER — SODIUM CHLORIDE 0.9 % IV SOLN: Freq: Once | INTRAVENOUS | Status: AC

## 2021-05-24 MED ORDER — SODIUM CHLORIDE 0.9 % IV SOLN
482.5000 mg | Freq: Once | INTRAVENOUS | Status: AC
  Administered 2021-05-24: 480 mg via INTRAVENOUS
  Filled 2021-05-24: qty 48

## 2021-05-24 MED ORDER — SODIUM CHLORIDE 0.9 % IV SOLN
10.0000 mg | Freq: Once | INTRAVENOUS | Status: AC
  Administered 2021-05-24: 10 mg via INTRAVENOUS
  Filled 2021-05-24: qty 10

## 2021-05-24 MED ORDER — SODIUM CHLORIDE 0.9 % IV SOLN
100.0000 mg/m2 | Freq: Once | INTRAVENOUS | Status: AC
  Administered 2021-05-24: 190 mg via INTRAVENOUS
  Filled 2021-05-24: qty 9.5

## 2021-05-24 MED ORDER — HEPARIN SOD (PORK) LOCK FLUSH 100 UNIT/ML IV SOLN
500.0000 [IU] | Freq: Once | INTRAVENOUS | Status: AC | PRN
  Administered 2021-05-24: 500 [IU]

## 2021-05-24 NOTE — Patient Instructions (Signed)
[facility]  Discharge Instructions: ?Thank you for choosing [facility] to provide your oncology and hematology care.  ? ?If you have a lab appointment with the [facility], please go directly to the [facility] and check in at the registration area. ?  ?Wear comfortable clothing and clothing appropriate for easy access to any Portacath or PICC line.  ? ?We strive to give you quality time with your provider. You may need to reschedule your appointment if you arrive late (15 or more minutes).  Arriving late affects you and other patients whose appointments are after yours.  Also, if you miss three or more appointments without notifying the office, you may be dismissed from the clinic at the provider?s discretion.    ?  ?For prescription refill requests, have your pharmacy contact our office and allow 72 hours for refills to be completed.   ? ?Today you received the following chemotherapy and/or immunotherapy agents: Carboplatin, Etoposide.     ?  ?To help prevent nausea and vomiting after your treatment, we encourage you to take your nausea medication as directed. ? ?BELOW ARE SYMPTOMS THAT SHOULD BE REPORTED IMMEDIATELY: ?*FEVER GREATER THAN 100.4 F (38 ?C) OR HIGHER ?*CHILLS OR SWEATING ?*NAUSEA AND VOMITING THAT IS NOT CONTROLLED WITH YOUR NAUSEA MEDICATION ?*UNUSUAL SHORTNESS OF BREATH ?*UNUSUAL BRUISING OR BLEEDING ?*URINARY PROBLEMS (pain or burning when urinating, or frequent urination) ?*BOWEL PROBLEMS (unusual diarrhea, constipation, pain near the anus) ?TENDERNESS IN MOUTH AND THROAT WITH OR WITHOUT PRESENCE OF ULCERS (sore throat, sores in mouth, or a toothache) ?UNUSUAL RASH, SWELLING OR PAIN  ?UNUSUAL VAGINAL DISCHARGE OR ITCHING  ? ?Items with * indicate a potential emergency and should be followed up as soon as possible or go to the Emergency Department if any problems should occur. ? ?Please show the CHEMOTHERAPY ALERT CARD or IMMUNOTHERAPY ALERT CARD  at check-in to the Emergency Department and triage nurse. ? ?Should you have questions after your visit or need to cancel or reschedule your appointment, please contact [facility]  Dept: [phone]  and follow the prompts.  Office hours are 8:00 a.m. to 4:30 p.m. Monday - Friday. Please note that voicemails left after 4:00 p.m. may not be returned until the following business day.  We are closed weekends and major holidays. You have access to a nurse at all times for urgent questions. Please call the main number to the clinic Dept: [phone] and follow the prompts. ? ? ?For any non-urgent questions, you may also contact your provider using MyChart. We now offer e-Visits for anyone 76 and older to request care online for non-urgent symptoms. For details visit [email or web address]. ?  ?Also download the MyChart app! Go to the app store, search "MyChart", open the app, select [facility], and log in with your MyChart username and password. ? ?Due to Covid, a mask is required upon entering the hospital/clinic. If you do not have a mask, one will be given to you upon arrival. For doctor visits, patients may have 1 support person aged 57 or older with them. For treatment visits, patients cannot have anyone with them due to current Covid guidelines and our immunocompromised population.  ? ?

## 2021-05-25 ENCOUNTER — Ambulatory Visit
Admission: RE | Admit: 2021-05-25 | Discharge: 2021-05-25 | Disposition: A | Discharge status: Home / Self Care | Payer: Medicare Other | Source: Ambulatory Visit | Attending: Radiation Oncology | Admitting: Radiation Oncology

## 2021-05-25 ENCOUNTER — Other Ambulatory Visit: Payer: Self-pay

## 2021-05-25 ENCOUNTER — Inpatient Hospital Stay: Payer: Medicare Other

## 2021-05-25 VITALS — BP 121/75 | HR 92 | Temp 98.0°F | Resp 17

## 2021-05-25 DIAGNOSIS — C3492 Malignant neoplasm of unspecified part of left bronchus or lung: Secondary | ICD-10-CM

## 2021-05-25 DIAGNOSIS — C3432 Malignant neoplasm of lower lobe, left bronchus or lung: Secondary | ICD-10-CM | POA: Diagnosis not present

## 2021-05-25 DIAGNOSIS — Z510 Encounter for antineoplastic radiation therapy: Secondary | ICD-10-CM | POA: Diagnosis not present

## 2021-05-25 DIAGNOSIS — Z87891 Personal history of nicotine dependence: Secondary | ICD-10-CM | POA: Diagnosis not present

## 2021-05-25 LAB — RAD ONC ARIA SESSION SUMMARY
Course Elapsed Days: 20
Plan Fractions Treated to Date: 15
Plan Prescribed Dose Per Fraction: 2 Gy
Plan Total Fractions Prescribed: 33
Plan Total Prescribed Dose: 66 Gy
Reference Point Dosage Given to Date: 30 Gy
Reference Point Session Dosage Given: 2 Gy
Session Number: 15

## 2021-05-25 MED ORDER — SODIUM CHLORIDE 0.9 % IV SOLN: Freq: Once | INTRAVENOUS | Status: AC

## 2021-05-25 MED ORDER — SODIUM CHLORIDE 0.9 % IV SOLN
10.0000 mg | Freq: Once | INTRAVENOUS | Status: AC
  Administered 2021-05-25: 10 mg via INTRAVENOUS
  Filled 2021-05-25: qty 10

## 2021-05-25 MED ORDER — SODIUM CHLORIDE 0.9 % IV SOLN
100.0000 mg/m2 | Freq: Once | INTRAVENOUS | Status: AC
  Administered 2021-05-25: 190 mg via INTRAVENOUS
  Filled 2021-05-25: qty 9.5

## 2021-05-25 MED ORDER — SODIUM CHLORIDE 0.9% FLUSH
10.0000 mL | INTRAVENOUS | Status: DC | PRN
  Administered 2021-05-25: 10 mL

## 2021-05-25 MED ORDER — HEPARIN SOD (PORK) LOCK FLUSH 100 UNIT/ML IV SOLN
500.0000 [IU] | Freq: Once | INTRAVENOUS | Status: AC | PRN
  Administered 2021-05-25: 500 [IU]

## 2021-05-25 NOTE — Patient Instructions (Signed)
[facility]  Discharge Instructions: ?Thank you for choosing [facility] to provide your oncology and hematology care.  ? ?If you have a lab appointment with the [facility], please go directly to the [facility] and check in at the registration area. ?  ?Wear comfortable clothing and clothing appropriate for easy access to any Portacath or PICC line.  ? ?We strive to give you quality time with your provider. You may need to reschedule your appointment if you arrive late (15 or more minutes).  Arriving late affects you and other patients whose appointments are after yours.  Also, if you miss three or more appointments without notifying the office, you may be dismissed from the clinic at the provider?s discretion.    ?  ?For prescription refill requests, have your pharmacy contact our office and allow 72 hours for refills to be completed.   ? ?Today you received the following chemotherapy and/or immunotherapy agent: Etoposide    ?  ?To help prevent nausea and vomiting after your treatment, we encourage you to take your nausea medication as directed. ? ?BELOW ARE SYMPTOMS THAT SHOULD BE REPORTED IMMEDIATELY: ?*FEVER GREATER THAN 100.4 F (38 ?C) OR HIGHER ?*CHILLS OR SWEATING ?*NAUSEA AND VOMITING THAT IS NOT CONTROLLED WITH YOUR NAUSEA MEDICATION ?*UNUSUAL SHORTNESS OF BREATH ?*UNUSUAL BRUISING OR BLEEDING ?*URINARY PROBLEMS (pain or burning when urinating, or frequent urination) ?*BOWEL PROBLEMS (unusual diarrhea, constipation, pain near the anus) ?TENDERNESS IN MOUTH AND THROAT WITH OR WITHOUT PRESENCE OF ULCERS (sore throat, sores in mouth, or a toothache) ?UNUSUAL RASH, SWELLING OR PAIN  ?UNUSUAL VAGINAL DISCHARGE OR ITCHING  ? ?Items with * indicate a potential emergency and should be followed up as soon as possible or go to the Emergency Department if any problems should occur. ? ?Please show the CHEMOTHERAPY ALERT CARD or IMMUNOTHERAPY ALERT CARD at check-in to  the Emergency Department and triage nurse. ? ?Should you have questions after your visit or need to cancel or reschedule your appointment, please contact [facility]  Dept: [phone]  and follow the prompts.  Office hours are 8:00 a.m. to 4:30 p.m. Monday - Friday. Please note that voicemails left after 4:00 p.m. may not be returned until the following business day.  We are closed weekends and major holidays. You have access to a nurse at all times for urgent questions. Please call the main number to the clinic Dept: [phone] and follow the prompts. ? ? ?For any non-urgent questions, you may also contact your provider using MyChart. We now offer e-Visits for anyone 72 and older to request care online for non-urgent symptoms. For details visit [email or web address]. ?  ?Also download the MyChart app! Go to the app store, search "MyChart", open the app, select [facility], and log in with your MyChart username and password. ? ?Due to Covid, a mask is required upon entering the hospital/clinic. If you do not have a mask, one will be given to you upon arrival. For doctor visits, patients may have 1 support person aged 109 or older with them. For treatment visits, patients cannot have anyone with them due to current Covid guidelines and our immunocompromised population.  ? ?

## 2021-05-26 ENCOUNTER — Other Ambulatory Visit: Payer: Self-pay

## 2021-05-26 ENCOUNTER — Inpatient Hospital Stay: Payer: Medicare Other

## 2021-05-26 ENCOUNTER — Ambulatory Visit
Admission: RE | Admit: 2021-05-26 | Discharge: 2021-05-26 | Disposition: A | Discharge status: Home / Self Care | Payer: Medicare Other | Source: Ambulatory Visit | Attending: Radiation Oncology | Admitting: Radiation Oncology

## 2021-05-26 VITALS — BP 123/82 | HR 74 | Temp 97.9°F | Resp 16

## 2021-05-26 DIAGNOSIS — C3492 Malignant neoplasm of unspecified part of left bronchus or lung: Secondary | ICD-10-CM | POA: Diagnosis not present

## 2021-05-26 DIAGNOSIS — Z87891 Personal history of nicotine dependence: Secondary | ICD-10-CM | POA: Diagnosis not present

## 2021-05-26 DIAGNOSIS — C3432 Malignant neoplasm of lower lobe, left bronchus or lung: Secondary | ICD-10-CM | POA: Diagnosis not present

## 2021-05-26 DIAGNOSIS — Z510 Encounter for antineoplastic radiation therapy: Secondary | ICD-10-CM | POA: Diagnosis not present

## 2021-05-26 LAB — RAD ONC ARIA SESSION SUMMARY
Course Elapsed Days: 21
Plan Fractions Treated to Date: 16
Plan Prescribed Dose Per Fraction: 2 Gy
Plan Total Fractions Prescribed: 33
Plan Total Prescribed Dose: 66 Gy
Reference Point Dosage Given to Date: 32 Gy
Reference Point Session Dosage Given: 2 Gy
Session Number: 16

## 2021-05-26 MED ORDER — HEPARIN SOD (PORK) LOCK FLUSH 100 UNIT/ML IV SOLN
500.0000 [IU] | Freq: Once | INTRAVENOUS | Status: AC | PRN
  Administered 2021-05-26: 500 [IU]

## 2021-05-26 MED ORDER — SODIUM CHLORIDE 0.9 % IV SOLN
100.0000 mg/m2 | Freq: Once | INTRAVENOUS | Status: AC
  Administered 2021-05-26: 190 mg via INTRAVENOUS
  Filled 2021-05-26: qty 9.5

## 2021-05-26 MED ORDER — SODIUM CHLORIDE 0.9% FLUSH
10.0000 mL | INTRAVENOUS | Status: DC | PRN
  Administered 2021-05-26: 10 mL

## 2021-05-26 MED ORDER — SODIUM CHLORIDE 0.9 % IV SOLN
10.0000 mg | Freq: Once | INTRAVENOUS | Status: AC
  Administered 2021-05-26: 10 mg via INTRAVENOUS
  Filled 2021-05-26: qty 10

## 2021-05-26 MED ORDER — SODIUM CHLORIDE 0.9 % IV SOLN: Freq: Once | INTRAVENOUS | Status: AC

## 2021-05-26 NOTE — Patient Instructions (Signed)
Kelly Olson  Discharge Instructions: ?Thank you for choosing [facility] to provide your oncology and hematology care.  ? ?If you have a lab appointment with the [facility], please go directly to the [facility] and check in at the registration area. ?  ?Wear comfortable clothing and clothing appropriate for easy access to any Portacath or PICC line.  ? ?We strive to give you quality time with your provider. You may need to reschedule your appointment if you arrive late (15 or more minutes).  Arriving late affects you and other patients whose appointments are after yours.  Also, if you miss three or more appointments without notifying the office, you may be dismissed from the clinic at the provider?s discretion.    ?  ?For prescription refill requests, have your pharmacy contact our office and allow 72 hours for refills to be completed.   ? ?Today you received the following chemotherapy and/or immunotherapy agent: Etoposide    ?  ?To help prevent nausea and vomiting after your treatment, we encourage you to take your nausea medication as directed. ? ?BELOW ARE SYMPTOMS THAT SHOULD BE REPORTED IMMEDIATELY: ?*FEVER GREATER THAN 100.4 F (38 ?C) OR HIGHER ?*CHILLS OR SWEATING ?*NAUSEA AND VOMITING THAT IS NOT CONTROLLED WITH YOUR NAUSEA MEDICATION ?*UNUSUAL SHORTNESS OF BREATH ?*UNUSUAL BRUISING OR BLEEDING ?*URINARY PROBLEMS (pain or burning when urinating, or frequent urination) ?*BOWEL PROBLEMS (unusual diarrhea, constipation, pain near the anus) ?TENDERNESS IN MOUTH AND THROAT WITH OR WITHOUT PRESENCE OF ULCERS (sore throat, sores in mouth, or a toothache) ?UNUSUAL RASH, SWELLING OR PAIN  ?UNUSUAL VAGINAL DISCHARGE OR ITCHING  ? ?Items with * indicate a potential emergency and should be followed up as soon as possible or go to the Emergency Department if any problems should occur. ? ?Please show the CHEMOTHERAPY ALERT CARD or IMMUNOTHERAPY ALERT CARD at check-in to  the Emergency Department and triage nurse. ? ?Should you have questions after your visit or need to cancel or reschedule your appointment, please contact [facility]  Dept: [phone]  and follow the prompts.  Office hours are 8:00 a.m. to 4:30 p.m. Monday - Friday. Please note that voicemails left after 4:00 p.m. may not be returned until the following business day.  We are closed weekends and major holidays. You have access to a nurse at all times for urgent questions. Please call the main number to the clinic Dept: [phone] and follow the prompts. ? ? ?For any non-urgent questions, you may also contact your provider using MyChart. We now offer e-Visits for anyone 14 and older to request care online for non-urgent symptoms. For details visit [email or web address]. ?  ?Also download the MyChart app! Go to the app store, search "MyChart", open the app, select [facility], and log in with your MyChart username and password. ? ?Due to Covid, a mask is required upon entering the hospital/clinic. If you do not have a mask, one will be given to you upon arrival. For doctor visits, patients may have 1 support person aged 45 or older with them. For treatment visits, patients cannot have anyone with them due to current Covid guidelines and our immunocompromised population.  ? ?

## 2021-05-27 ENCOUNTER — Ambulatory Visit
Admission: RE | Admit: 2021-05-27 | Discharge: 2021-05-27 | Disposition: A | Discharge status: Home / Self Care | Payer: Medicare Other | Source: Ambulatory Visit | Attending: Radiation Oncology | Admitting: Radiation Oncology

## 2021-05-27 ENCOUNTER — Other Ambulatory Visit: Payer: Self-pay

## 2021-05-27 DIAGNOSIS — Z87891 Personal history of nicotine dependence: Secondary | ICD-10-CM | POA: Diagnosis not present

## 2021-05-27 DIAGNOSIS — C3432 Malignant neoplasm of lower lobe, left bronchus or lung: Secondary | ICD-10-CM | POA: Diagnosis not present

## 2021-05-27 DIAGNOSIS — Z510 Encounter for antineoplastic radiation therapy: Secondary | ICD-10-CM | POA: Diagnosis not present

## 2021-05-27 DIAGNOSIS — C3492 Malignant neoplasm of unspecified part of left bronchus or lung: Secondary | ICD-10-CM | POA: Diagnosis not present

## 2021-05-27 LAB — RAD ONC ARIA SESSION SUMMARY
Course Elapsed Days: 22
Plan Fractions Treated to Date: 17
Plan Prescribed Dose Per Fraction: 2 Gy
Plan Total Fractions Prescribed: 33
Plan Total Prescribed Dose: 66 Gy
Reference Point Dosage Given to Date: 34 Gy
Reference Point Session Dosage Given: 2 Gy
Session Number: 17

## 2021-05-28 LAB — ACID FAST CULTURE WITH REFLEXED SENSITIVITIES (MYCOBACTERIA)
Acid Fast Culture: NEGATIVE
Acid Fast Culture: NEGATIVE

## 2021-05-30 ENCOUNTER — Other Ambulatory Visit: Payer: Self-pay

## 2021-05-30 ENCOUNTER — Ambulatory Visit
Admission: RE | Admit: 2021-05-30 | Discharge: 2021-05-30 | Disposition: A | Discharge status: Home / Self Care | Payer: Medicare Other | Source: Ambulatory Visit | Attending: Radiation Oncology | Admitting: Radiation Oncology

## 2021-05-30 DIAGNOSIS — Z5189 Encounter for other specified aftercare: Secondary | ICD-10-CM | POA: Diagnosis not present

## 2021-05-30 DIAGNOSIS — Z79899 Other long term (current) drug therapy: Secondary | ICD-10-CM | POA: Insufficient documentation

## 2021-05-30 DIAGNOSIS — C3492 Malignant neoplasm of unspecified part of left bronchus or lung: Secondary | ICD-10-CM | POA: Insufficient documentation

## 2021-05-30 DIAGNOSIS — Z510 Encounter for antineoplastic radiation therapy: Secondary | ICD-10-CM | POA: Diagnosis not present

## 2021-05-30 DIAGNOSIS — Z5111 Encounter for antineoplastic chemotherapy: Secondary | ICD-10-CM | POA: Diagnosis not present

## 2021-05-30 DIAGNOSIS — Z87891 Personal history of nicotine dependence: Secondary | ICD-10-CM | POA: Diagnosis not present

## 2021-05-30 DIAGNOSIS — C3432 Malignant neoplasm of lower lobe, left bronchus or lung: Secondary | ICD-10-CM | POA: Diagnosis not present

## 2021-05-30 LAB — RAD ONC ARIA SESSION SUMMARY
Course Elapsed Days: 25
Plan Fractions Treated to Date: 18
Plan Prescribed Dose Per Fraction: 2 Gy
Plan Total Fractions Prescribed: 33
Plan Total Prescribed Dose: 66 Gy
Reference Point Dosage Given to Date: 36 Gy
Reference Point Session Dosage Given: 2 Gy
Session Number: 18

## 2021-05-31 ENCOUNTER — Ambulatory Visit
Admission: RE | Admit: 2021-05-31 | Discharge: 2021-05-31 | Disposition: A | Discharge status: Home / Self Care | Payer: Medicare Other | Source: Ambulatory Visit | Attending: Radiation Oncology | Admitting: Radiation Oncology

## 2021-05-31 ENCOUNTER — Other Ambulatory Visit: Payer: Self-pay

## 2021-05-31 ENCOUNTER — Inpatient Hospital Stay: Payer: Medicare Other | Attending: Internal Medicine

## 2021-05-31 DIAGNOSIS — Z510 Encounter for antineoplastic radiation therapy: Secondary | ICD-10-CM | POA: Diagnosis not present

## 2021-05-31 DIAGNOSIS — Z5111 Encounter for antineoplastic chemotherapy: Secondary | ICD-10-CM | POA: Insufficient documentation

## 2021-05-31 DIAGNOSIS — C3492 Malignant neoplasm of unspecified part of left bronchus or lung: Secondary | ICD-10-CM

## 2021-05-31 DIAGNOSIS — Z87891 Personal history of nicotine dependence: Secondary | ICD-10-CM | POA: Diagnosis not present

## 2021-05-31 DIAGNOSIS — Z79899 Other long term (current) drug therapy: Secondary | ICD-10-CM | POA: Diagnosis not present

## 2021-05-31 DIAGNOSIS — C3432 Malignant neoplasm of lower lobe, left bronchus or lung: Secondary | ICD-10-CM | POA: Insufficient documentation

## 2021-05-31 DIAGNOSIS — Z5189 Encounter for other specified aftercare: Secondary | ICD-10-CM | POA: Insufficient documentation

## 2021-05-31 LAB — CBC WITH DIFFERENTIAL (CANCER CENTER ONLY)
Abs Immature Granulocytes: 0.07 10*3/uL (ref 0.00–0.07)
Basophils Absolute: 0 10*3/uL (ref 0.0–0.1)
Basophils Relative: 1 %
Eosinophils Absolute: 0 10*3/uL (ref 0.0–0.5)
Eosinophils Relative: 0 %
HCT: 39.4 % (ref 39.0–52.0)
Hemoglobin: 13.3 g/dL (ref 13.0–17.0)
Immature Granulocytes: 1 %
Lymphocytes Relative: 6 %
Lymphs Abs: 0.3 10*3/uL — ABNORMAL LOW (ref 0.7–4.0)
MCH: 32.6 pg (ref 26.0–34.0)
MCHC: 33.8 g/dL (ref 30.0–36.0)
MCV: 96.6 fL (ref 80.0–100.0)
Monocytes Absolute: 0 10*3/uL — ABNORMAL LOW (ref 0.1–1.0)
Monocytes Relative: 0 %
Neutro Abs: 4.5 10*3/uL (ref 1.7–7.7)
Neutrophils Relative %: 92 %
Platelet Count: 186 10*3/uL (ref 150–400)
RBC: 4.08 MIL/uL — ABNORMAL LOW (ref 4.22–5.81)
RDW: 12.4 % (ref 11.5–15.5)
Smear Review: NORMAL
WBC Count: 4.9 10*3/uL (ref 4.0–10.5)
nRBC: 0 % (ref 0.0–0.2)

## 2021-05-31 LAB — RAD ONC ARIA SESSION SUMMARY
Course Elapsed Days: 26
Plan Fractions Treated to Date: 19
Plan Prescribed Dose Per Fraction: 2 Gy
Plan Total Fractions Prescribed: 33
Plan Total Prescribed Dose: 66 Gy
Reference Point Dosage Given to Date: 38 Gy
Reference Point Session Dosage Given: 2 Gy
Session Number: 19

## 2021-05-31 LAB — CMP (CANCER CENTER ONLY)
ALT: 23 U/L (ref 0–44)
AST: 18 U/L (ref 15–41)
Albumin: 3.9 g/dL (ref 3.5–5.0)
Alkaline Phosphatase: 90 U/L (ref 38–126)
Anion gap: 4 — ABNORMAL LOW (ref 5–15)
BUN: 26 mg/dL — ABNORMAL HIGH (ref 8–23)
CO2: 32 mmol/L (ref 22–32)
Calcium: 9.2 mg/dL (ref 8.9–10.3)
Chloride: 102 mmol/L (ref 98–111)
Creatinine: 0.83 mg/dL (ref 0.61–1.24)
GFR, Estimated: >60 mL/min (ref >60)
Glucose, Bld: 154 mg/dL — ABNORMAL HIGH (ref 70–99)
Potassium: 4.2 mmol/L (ref 3.5–5.1)
Sodium: 138 mmol/L (ref 135–145)
Total Bilirubin: 0.6 mg/dL (ref 0.3–1.2)
Total Protein: 6.7 g/dL (ref 6.5–8.1)

## 2021-06-01 ENCOUNTER — Other Ambulatory Visit: Payer: Self-pay

## 2021-06-01 ENCOUNTER — Ambulatory Visit
Admission: RE | Admit: 2021-06-01 | Discharge: 2021-06-01 | Disposition: A | Discharge status: Home / Self Care | Payer: Medicare Other | Source: Ambulatory Visit | Attending: Radiation Oncology | Admitting: Radiation Oncology

## 2021-06-01 DIAGNOSIS — Z79899 Other long term (current) drug therapy: Secondary | ICD-10-CM | POA: Diagnosis not present

## 2021-06-01 DIAGNOSIS — Z510 Encounter for antineoplastic radiation therapy: Secondary | ICD-10-CM | POA: Diagnosis not present

## 2021-06-01 DIAGNOSIS — Z87891 Personal history of nicotine dependence: Secondary | ICD-10-CM | POA: Diagnosis not present

## 2021-06-01 DIAGNOSIS — C3432 Malignant neoplasm of lower lobe, left bronchus or lung: Secondary | ICD-10-CM | POA: Diagnosis not present

## 2021-06-01 DIAGNOSIS — Z5189 Encounter for other specified aftercare: Secondary | ICD-10-CM | POA: Diagnosis not present

## 2021-06-01 DIAGNOSIS — Z5111 Encounter for antineoplastic chemotherapy: Secondary | ICD-10-CM | POA: Diagnosis not present

## 2021-06-01 LAB — RAD ONC ARIA SESSION SUMMARY
Course Elapsed Days: 27
Plan Fractions Treated to Date: 20
Plan Prescribed Dose Per Fraction: 2 Gy
Plan Total Fractions Prescribed: 33
Plan Total Prescribed Dose: 66 Gy
Reference Point Dosage Given to Date: 40 Gy
Reference Point Session Dosage Given: 2 Gy
Session Number: 20

## 2021-06-02 ENCOUNTER — Ambulatory Visit
Admission: RE | Admit: 2021-06-02 | Discharge: 2021-06-02 | Disposition: A | Discharge status: Home / Self Care | Payer: Medicare Other | Source: Ambulatory Visit | Attending: Radiation Oncology | Admitting: Radiation Oncology

## 2021-06-02 ENCOUNTER — Other Ambulatory Visit: Payer: Self-pay

## 2021-06-02 DIAGNOSIS — C3432 Malignant neoplasm of lower lobe, left bronchus or lung: Secondary | ICD-10-CM | POA: Diagnosis not present

## 2021-06-02 DIAGNOSIS — Z510 Encounter for antineoplastic radiation therapy: Secondary | ICD-10-CM | POA: Diagnosis not present

## 2021-06-02 DIAGNOSIS — Z5189 Encounter for other specified aftercare: Secondary | ICD-10-CM | POA: Diagnosis not present

## 2021-06-02 DIAGNOSIS — Z79899 Other long term (current) drug therapy: Secondary | ICD-10-CM | POA: Diagnosis not present

## 2021-06-02 DIAGNOSIS — Z87891 Personal history of nicotine dependence: Secondary | ICD-10-CM | POA: Diagnosis not present

## 2021-06-02 DIAGNOSIS — Z5111 Encounter for antineoplastic chemotherapy: Secondary | ICD-10-CM | POA: Diagnosis not present

## 2021-06-02 LAB — RAD ONC ARIA SESSION SUMMARY
Course Elapsed Days: 28
Plan Fractions Treated to Date: 21
Plan Prescribed Dose Per Fraction: 2 Gy
Plan Total Fractions Prescribed: 33
Plan Total Prescribed Dose: 66 Gy
Reference Point Dosage Given to Date: 42 Gy
Reference Point Session Dosage Given: 2 Gy
Session Number: 21

## 2021-06-03 ENCOUNTER — Other Ambulatory Visit: Payer: Self-pay

## 2021-06-03 ENCOUNTER — Ambulatory Visit
Admission: RE | Admit: 2021-06-03 | Discharge: 2021-06-03 | Disposition: A | Discharge status: Home / Self Care | Payer: Medicare Other | Source: Ambulatory Visit | Attending: Radiation Oncology | Admitting: Radiation Oncology

## 2021-06-03 ENCOUNTER — Other Ambulatory Visit: Payer: Self-pay | Admitting: Radiation Oncology

## 2021-06-03 DIAGNOSIS — Z5111 Encounter for antineoplastic chemotherapy: Secondary | ICD-10-CM | POA: Diagnosis not present

## 2021-06-03 DIAGNOSIS — Z510 Encounter for antineoplastic radiation therapy: Secondary | ICD-10-CM | POA: Diagnosis not present

## 2021-06-03 DIAGNOSIS — Z5189 Encounter for other specified aftercare: Secondary | ICD-10-CM | POA: Diagnosis not present

## 2021-06-03 DIAGNOSIS — Z79899 Other long term (current) drug therapy: Secondary | ICD-10-CM | POA: Diagnosis not present

## 2021-06-03 DIAGNOSIS — C3432 Malignant neoplasm of lower lobe, left bronchus or lung: Secondary | ICD-10-CM | POA: Diagnosis not present

## 2021-06-03 DIAGNOSIS — Z87891 Personal history of nicotine dependence: Secondary | ICD-10-CM | POA: Diagnosis not present

## 2021-06-03 LAB — RAD ONC ARIA SESSION SUMMARY
Course Elapsed Days: 29
Plan Fractions Treated to Date: 22
Plan Prescribed Dose Per Fraction: 2 Gy
Plan Total Fractions Prescribed: 33
Plan Total Prescribed Dose: 66 Gy
Reference Point Dosage Given to Date: 44 Gy
Reference Point Session Dosage Given: 2 Gy
Session Number: 22

## 2021-06-06 ENCOUNTER — Other Ambulatory Visit: Payer: Self-pay

## 2021-06-06 ENCOUNTER — Ambulatory Visit
Admission: RE | Admit: 2021-06-06 | Discharge: 2021-06-06 | Disposition: A | Discharge status: Home / Self Care | Payer: Medicare Other | Source: Ambulatory Visit | Attending: Radiation Oncology | Admitting: Radiation Oncology

## 2021-06-06 DIAGNOSIS — Z5189 Encounter for other specified aftercare: Secondary | ICD-10-CM | POA: Diagnosis not present

## 2021-06-06 DIAGNOSIS — C3432 Malignant neoplasm of lower lobe, left bronchus or lung: Secondary | ICD-10-CM | POA: Diagnosis not present

## 2021-06-06 DIAGNOSIS — Z20822 Contact with and (suspected) exposure to covid-19: Secondary | ICD-10-CM | POA: Diagnosis not present

## 2021-06-06 DIAGNOSIS — Z87891 Personal history of nicotine dependence: Secondary | ICD-10-CM | POA: Diagnosis not present

## 2021-06-06 DIAGNOSIS — Z5111 Encounter for antineoplastic chemotherapy: Secondary | ICD-10-CM | POA: Diagnosis not present

## 2021-06-06 DIAGNOSIS — Z510 Encounter for antineoplastic radiation therapy: Secondary | ICD-10-CM | POA: Diagnosis not present

## 2021-06-06 DIAGNOSIS — Z79899 Other long term (current) drug therapy: Secondary | ICD-10-CM | POA: Diagnosis not present

## 2021-06-06 LAB — RAD ONC ARIA SESSION SUMMARY
Course Elapsed Days: 32
Plan Fractions Treated to Date: 23
Plan Prescribed Dose Per Fraction: 2 Gy
Plan Total Fractions Prescribed: 33
Plan Total Prescribed Dose: 66 Gy
Reference Point Dosage Given to Date: 46 Gy
Reference Point Session Dosage Given: 2 Gy
Session Number: 23

## 2021-06-07 ENCOUNTER — Other Ambulatory Visit: Payer: Self-pay

## 2021-06-07 ENCOUNTER — Inpatient Hospital Stay: Payer: Medicare Other

## 2021-06-07 ENCOUNTER — Ambulatory Visit
Admission: RE | Admit: 2021-06-07 | Discharge: 2021-06-07 | Disposition: A | Discharge status: Home / Self Care | Payer: Medicare Other | Source: Ambulatory Visit | Attending: Radiation Oncology | Admitting: Radiation Oncology

## 2021-06-07 ENCOUNTER — Telehealth: Payer: Self-pay | Admitting: Medical Oncology

## 2021-06-07 VITALS — BP 122/70 | HR 90 | Temp 98.2°F | Resp 18

## 2021-06-07 DIAGNOSIS — Z79899 Other long term (current) drug therapy: Secondary | ICD-10-CM | POA: Diagnosis not present

## 2021-06-07 DIAGNOSIS — Z5111 Encounter for antineoplastic chemotherapy: Secondary | ICD-10-CM | POA: Diagnosis not present

## 2021-06-07 DIAGNOSIS — Z5189 Encounter for other specified aftercare: Secondary | ICD-10-CM | POA: Diagnosis not present

## 2021-06-07 DIAGNOSIS — C3492 Malignant neoplasm of unspecified part of left bronchus or lung: Secondary | ICD-10-CM

## 2021-06-07 DIAGNOSIS — C3432 Malignant neoplasm of lower lobe, left bronchus or lung: Secondary | ICD-10-CM | POA: Diagnosis not present

## 2021-06-07 DIAGNOSIS — Z510 Encounter for antineoplastic radiation therapy: Secondary | ICD-10-CM | POA: Diagnosis not present

## 2021-06-07 DIAGNOSIS — Z87891 Personal history of nicotine dependence: Secondary | ICD-10-CM | POA: Diagnosis not present

## 2021-06-07 LAB — CBC WITH DIFFERENTIAL (CANCER CENTER ONLY)
Abs Immature Granulocytes: 0 10*3/uL (ref 0.00–0.07)
Basophils Absolute: 0 10*3/uL (ref 0.0–0.1)
Basophils Relative: 1 %
Eosinophils Absolute: 0 10*3/uL (ref 0.0–0.5)
Eosinophils Relative: 0 %
HCT: 36.1 % — ABNORMAL LOW (ref 39.0–52.0)
Hemoglobin: 12.3 g/dL — ABNORMAL LOW (ref 13.0–17.0)
Lymphocytes Relative: 44 %
Lymphs Abs: 0.4 10*3/uL — ABNORMAL LOW (ref 0.7–4.0)
MCH: 32.4 pg (ref 26.0–34.0)
MCHC: 34.1 g/dL (ref 30.0–36.0)
MCV: 95 fL (ref 80.0–100.0)
Monocytes Absolute: 0.1 10*3/uL (ref 0.1–1.0)
Monocytes Relative: 14 %
Neutro Abs: 0.3 10*3/uL — CL (ref 1.7–7.7)
Neutrophils Relative %: 41 %
Platelet Count: 134 10*3/uL — ABNORMAL LOW (ref 150–400)
RBC: 3.8 MIL/uL — ABNORMAL LOW (ref 4.22–5.81)
RDW: 12.1 % (ref 11.5–15.5)
Smear Review: NORMAL
WBC Count: 0.8 10*3/uL — CL (ref 4.0–10.5)
nRBC: 0 % (ref 0.0–0.2)

## 2021-06-07 LAB — CMP (CANCER CENTER ONLY)
ALT: 20 U/L (ref 0–44)
AST: 18 U/L (ref 15–41)
Albumin: 3.5 g/dL (ref 3.5–5.0)
Alkaline Phosphatase: 86 U/L (ref 38–126)
Anion gap: 11 (ref 5–15)
BUN: 17 mg/dL (ref 8–23)
CO2: 27 mmol/L (ref 22–32)
Calcium: 9.1 mg/dL (ref 8.9–10.3)
Chloride: 98 mmol/L (ref 98–111)
Creatinine: 0.76 mg/dL (ref 0.61–1.24)
GFR, Estimated: >60 mL/min (ref >60)
Glucose, Bld: 136 mg/dL — ABNORMAL HIGH (ref 70–99)
Potassium: 3.8 mmol/L (ref 3.5–5.1)
Sodium: 136 mmol/L (ref 135–145)
Total Bilirubin: 0.6 mg/dL (ref 0.3–1.2)
Total Protein: 7.2 g/dL (ref 6.5–8.1)

## 2021-06-07 LAB — RAD ONC ARIA SESSION SUMMARY
Course Elapsed Days: 33
Plan Fractions Treated to Date: 24
Plan Prescribed Dose Per Fraction: 2 Gy
Plan Total Fractions Prescribed: 33
Plan Total Prescribed Dose: 66 Gy
Reference Point Dosage Given to Date: 48 Gy
Reference Point Session Dosage Given: 2 Gy
Session Number: 24

## 2021-06-07 MED ORDER — FILGRASTIM-SNDZ 300 MCG/0.5ML IJ SOSY
300.0000 ug | PREFILLED_SYRINGE | Freq: Once | INTRAMUSCULAR | Status: AC
  Administered 2021-06-07: 300 ug via SUBCUTANEOUS
  Filled 2021-06-07: qty 0.5

## 2021-06-07 NOTE — Telephone Encounter (Signed)
Reviewed neutropenic precautions with pt . ?Schedule message sent to start Zarxio today x 3 days ?

## 2021-06-07 NOTE — Addendum Note (Signed)
Addended by: Carolynne Edouard B on: 06/07/2021 09:51 AM ? ? Modules accepted: Orders ? ?

## 2021-06-08 ENCOUNTER — Other Ambulatory Visit: Payer: Self-pay

## 2021-06-08 ENCOUNTER — Ambulatory Visit
Admission: RE | Admit: 2021-06-08 | Discharge: 2021-06-08 | Disposition: A | Discharge status: Home / Self Care | Payer: Medicare Other | Source: Ambulatory Visit | Attending: Radiation Oncology | Admitting: Radiation Oncology

## 2021-06-08 ENCOUNTER — Inpatient Hospital Stay: Payer: Medicare Other

## 2021-06-08 VITALS — BP 108/69 | HR 91 | Temp 98.4°F | Resp 18

## 2021-06-08 DIAGNOSIS — Z510 Encounter for antineoplastic radiation therapy: Secondary | ICD-10-CM | POA: Diagnosis not present

## 2021-06-08 DIAGNOSIS — C3492 Malignant neoplasm of unspecified part of left bronchus or lung: Secondary | ICD-10-CM

## 2021-06-08 DIAGNOSIS — Z5189 Encounter for other specified aftercare: Secondary | ICD-10-CM | POA: Diagnosis not present

## 2021-06-08 DIAGNOSIS — Z87891 Personal history of nicotine dependence: Secondary | ICD-10-CM | POA: Diagnosis not present

## 2021-06-08 DIAGNOSIS — Z5111 Encounter for antineoplastic chemotherapy: Secondary | ICD-10-CM | POA: Diagnosis not present

## 2021-06-08 DIAGNOSIS — Z79899 Other long term (current) drug therapy: Secondary | ICD-10-CM | POA: Diagnosis not present

## 2021-06-08 DIAGNOSIS — C3432 Malignant neoplasm of lower lobe, left bronchus or lung: Secondary | ICD-10-CM | POA: Diagnosis not present

## 2021-06-08 LAB — RAD ONC ARIA SESSION SUMMARY
Course Elapsed Days: 34
Plan Fractions Treated to Date: 25
Plan Prescribed Dose Per Fraction: 2 Gy
Plan Total Fractions Prescribed: 33
Plan Total Prescribed Dose: 66 Gy
Reference Point Dosage Given to Date: 50 Gy
Reference Point Session Dosage Given: 2 Gy
Session Number: 25

## 2021-06-08 MED ORDER — FILGRASTIM-SNDZ 300 MCG/0.5ML IJ SOSY
300.0000 ug | PREFILLED_SYRINGE | Freq: Once | INTRAMUSCULAR | Status: AC
  Administered 2021-06-08: 300 ug via SUBCUTANEOUS
  Filled 2021-06-08: qty 0.5

## 2021-06-08 NOTE — Patient Instructions (Signed)
Filgrastim, G-CSF injection ?What is this medication? ?FILGRASTIM, G-CSF (fil GRA stim) is a granulocyte colony-stimulating factor that stimulates the growth of neutrophils, a type of white blood cell (WBC) important in the body's fight against infection. It is used to reduce the incidence of fever and infection in patients with certain types of cancer who are receiving chemotherapy that affects the bone marrow, to stimulate blood cell production for removal of WBCs from the body prior to a bone marrow transplantation, to reduce the incidence of fever and infection in patients who have severe chronic neutropenia, and to improve survival outcomes following high-dose radiation exposure that is toxic to the bone marrow. ?This medicine may be used for other purposes; ask your health care provider or pharmacist if you have questions. ?COMMON BRAND NAME(S): Neupogen, Nivestym, Releuko, Zarxio ?What should I tell my care team before I take this medication? ?They need to know if you have any of these conditions: ?kidney disease ?latex allergy ?ongoing radiation therapy ?sickle cell disease ?an unusual or allergic reaction to filgrastim, pegfilgrastim, other medicines, foods, dyes, or preservatives ?pregnant or trying to get pregnant ?breast-feeding ?How should I use this medication? ?This medicine is for injection under the skin or infusion into a vein. As an infusion into a vein, it is usually given by a health care professional in a hospital or clinic setting. If you get this medicine at home, you will be taught how to prepare and give this medicine. Refer to the Instructions for Use that come with your medication packaging. Use exactly as directed. Take your medicine at regular intervals. Do not take your medicine more often than directed. ?It is important that you put your used needles and syringes in a special sharps container. Do not put them in a trash can. If you do not have a sharps container, call your pharmacist  or healthcare provider to get one. ?Talk to your pediatrician regarding the use of this medicine in children. While this drug may be prescribed for children as young as 7 months for selected conditions, precautions do apply. ?Overdosage: If you think you have taken too much of this medicine contact a poison control center or emergency room at once. ?NOTE: This medicine is only for you. Do not share this medicine with others. ?What if I miss a dose? ?It is important not to miss your dose. Call your doctor or health care professional if you miss a dose. ?What may interact with this medication? ?This medicine may interact with the following medications: ?medicines that may cause a release of neutrophils, such as lithium ?This list may not describe all possible interactions. Give your health care provider a list of all the medicines, herbs, non-prescription drugs, or dietary supplements you use. Also tell them if you smoke, drink alcohol, or use illegal drugs. Some items may interact with your medicine. ?What should I watch for while using this medication? ?Your condition will be monitored carefully while you are receiving this medicine. ?You may need blood work done while you are taking this medicine. ?Talk to your health care provider about your risk of cancer. You may be more at risk for certain types of cancer if you take this medicine. ?What side effects may I notice from receiving this medication? ?Side effects that you should report to your doctor or health care professional as soon as possible: ?allergic reactions like skin rash, itching or hives, swelling of the face, lips, or tongue ?back pain ?dizziness or feeling faint ?fever ?pain, redness, or  irritation at site where injected ?pinpoint red spots on the skin ?shortness of breath or breathing problems ?signs and symptoms of kidney injury like trouble passing urine, change in the amount of urine, or red or dark-brown urine ?stomach or side pain, or pain at  the shoulder ?swelling ?tiredness ?unusual bleeding or bruising ?Side effects that usually do not require medical attention (report to your doctor or health care professional if they continue or are bothersome): ?bone pain ?cough ?diarrhea ?hair loss ?headache ?muscle pain ?This list may not describe all possible side effects. Call your doctor for medical advice about side effects. You may report side effects to FDA at 1-800-FDA-1088. ?Where should I keep my medication? ?Keep out of the reach of children. ?Store in a refrigerator between 2 and 8 degrees C (36 and 46 degrees F). Do not freeze. Keep in carton to protect from light. Throw away this medicine if vials or syringes are left out of the refrigerator for more than 24 hours. Throw away any unused medicine after the expiration date. ?NOTE: This sheet is a summary. It may not cover all possible information. If you have questions about this medicine, talk to your doctor, pharmacist, or health care provider. ?? 2023 Elsevier/Gold Standard (2020-12-17 00:00:00) ? ?

## 2021-06-09 ENCOUNTER — Other Ambulatory Visit: Payer: Self-pay

## 2021-06-09 ENCOUNTER — Inpatient Hospital Stay: Payer: Medicare Other

## 2021-06-09 ENCOUNTER — Ambulatory Visit
Admission: RE | Admit: 2021-06-09 | Discharge: 2021-06-09 | Disposition: A | Discharge status: Home / Self Care | Payer: Medicare Other | Source: Ambulatory Visit | Attending: Radiation Oncology | Admitting: Radiation Oncology

## 2021-06-09 VITALS — BP 106/72 | HR 95 | Temp 98.2°F | Resp 18

## 2021-06-09 DIAGNOSIS — C3492 Malignant neoplasm of unspecified part of left bronchus or lung: Secondary | ICD-10-CM

## 2021-06-09 DIAGNOSIS — Z510 Encounter for antineoplastic radiation therapy: Secondary | ICD-10-CM | POA: Diagnosis not present

## 2021-06-09 DIAGNOSIS — Z5111 Encounter for antineoplastic chemotherapy: Secondary | ICD-10-CM | POA: Diagnosis not present

## 2021-06-09 DIAGNOSIS — Z79899 Other long term (current) drug therapy: Secondary | ICD-10-CM | POA: Diagnosis not present

## 2021-06-09 DIAGNOSIS — Z87891 Personal history of nicotine dependence: Secondary | ICD-10-CM | POA: Diagnosis not present

## 2021-06-09 DIAGNOSIS — C3432 Malignant neoplasm of lower lobe, left bronchus or lung: Secondary | ICD-10-CM | POA: Diagnosis not present

## 2021-06-09 DIAGNOSIS — Z5189 Encounter for other specified aftercare: Secondary | ICD-10-CM | POA: Diagnosis not present

## 2021-06-09 LAB — RAD ONC ARIA SESSION SUMMARY
Course Elapsed Days: 35
Plan Fractions Treated to Date: 26
Plan Prescribed Dose Per Fraction: 2 Gy
Plan Total Fractions Prescribed: 33
Plan Total Prescribed Dose: 66 Gy
Reference Point Dosage Given to Date: 52 Gy
Reference Point Session Dosage Given: 2 Gy
Session Number: 26

## 2021-06-09 MED ORDER — FILGRASTIM-SNDZ 300 MCG/0.5ML IJ SOSY
300.0000 ug | PREFILLED_SYRINGE | Freq: Once | INTRAMUSCULAR | Status: AC
  Administered 2021-06-09: 300 ug via SUBCUTANEOUS
  Filled 2021-06-09: qty 0.5

## 2021-06-10 ENCOUNTER — Ambulatory Visit
Admission: RE | Admit: 2021-06-10 | Discharge: 2021-06-10 | Disposition: A | Discharge status: Home / Self Care | Payer: Medicare Other | Source: Ambulatory Visit | Attending: Radiation Oncology | Admitting: Radiation Oncology

## 2021-06-10 ENCOUNTER — Other Ambulatory Visit: Payer: Self-pay

## 2021-06-10 DIAGNOSIS — Z87891 Personal history of nicotine dependence: Secondary | ICD-10-CM | POA: Diagnosis not present

## 2021-06-10 DIAGNOSIS — Z5111 Encounter for antineoplastic chemotherapy: Secondary | ICD-10-CM | POA: Diagnosis not present

## 2021-06-10 DIAGNOSIS — Z79899 Other long term (current) drug therapy: Secondary | ICD-10-CM | POA: Diagnosis not present

## 2021-06-10 DIAGNOSIS — C3432 Malignant neoplasm of lower lobe, left bronchus or lung: Secondary | ICD-10-CM | POA: Diagnosis not present

## 2021-06-10 DIAGNOSIS — Z510 Encounter for antineoplastic radiation therapy: Secondary | ICD-10-CM | POA: Diagnosis not present

## 2021-06-10 DIAGNOSIS — Z5189 Encounter for other specified aftercare: Secondary | ICD-10-CM | POA: Diagnosis not present

## 2021-06-10 LAB — RAD ONC ARIA SESSION SUMMARY
Course Elapsed Days: 36
Plan Fractions Treated to Date: 27
Plan Prescribed Dose Per Fraction: 2 Gy
Plan Total Fractions Prescribed: 33
Plan Total Prescribed Dose: 66 Gy
Reference Point Dosage Given to Date: 54 Gy
Reference Point Session Dosage Given: 2 Gy
Session Number: 27

## 2021-06-10 NOTE — Progress Notes (Signed)
[facility] ?OFFICE PROGRESS NOTE ? ?[name], MD ?[address] ?[place] [address] ? ?DIAGNOSIS: Unresectable limited stage IIb (T3, N0, M0) small cell lung cancer presented with 2 pulmonary nodules in the left lower lobe with no evidence of metastatic disease to the lymph nodes or distant metastasis diagnosed in March 2023 ? ?PRIOR THERAPY: None ? ?CURRENT THERAPY:  Systemic chemotherapy with carboplatin for AUC of 5 and etoposide 100 Mg/M2 on days 1, 2 and 3 every 3 weeks.  First dose started 05/02/2021.  Status post 2 cycle.  This is concurrent with radiotherapy under the care of Dr. Tammi Klippel.  Neulasta will be added starting from cycle #3 due to neutropenia. ?  ? ?INTERVAL HISTORY: ?Kelly Olson. 71 y.o. male returns to the clinic today for a follow-up visit accompanied by his wife.  The patient was recently diagnosed with small cell lung cancer. He is undergoing chemotherapy. He is tolerating this fairly well, except he sometimes has neutropenia on weekly labs and receive Zarxio injections x3. His most recent Zaxio injection was on 06/09/21.  He denied any fevers, upper respiratory infections, dysuria, abdominal pain, or diarrhea.  He does tend to gets a little more winded with exertion, such as climbing the stairs consistently 7 to 10 days after receiving injections. ? ?With the patient's first infusion he developed thrombophlebitis in the right forearm.  He did eventually have a Port-A-Cath placed.  He continues to have erythema and hardening in his right forearm despite using heating pads.  He also completed Bactrim with no appreciable improvement which was prescribed by the emergency room a few weeks ago. ? ?The patient denies any unusual cough besides his occasional cough secondary to sinus drainage.  He also is undergoing radiation to the lung.  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  Overall, he is able to  tolerate this side effect.  Dr. Tammi Klippel and discussed with the patient previously that he expect this to improve 2 weeks following radiation.  His last day radiation is scheduled for 06/20/2021.  He denies any fever, chills, night sweats, or unexplained weight loss. The patient denies any hemoptysis or chest pain.  Denies any nausea, vomiting, diarrhea, or constipation.  Denies any headache or visual changes. The patient is here today for evaluation and repeat blood work before starting cycle #3. ? ? ? ? ? ?MEDICAL HISTORY: ?Past Medical History:  ?Diagnosis Date  ? Coronary artery disease, occlusive 06/02/2014  ? Multivessel CAD.mLAD-100%, mRCA 99%, dRCA 100%, mLCX 90%  And EF 35-45%. -->  Referred for CABG; nonischemic Myoview March 2020  ? Former heavy tobacco smoker   ? Quit in April 2016   ? GERD (gastroesophageal reflux disease)   ? Hyperlipidemia with target LDL less than 70   ? Hypertension   ? Ischemic cardiomyopathy - resolved 05/2014  ? Myoview: EF ~33% with "infarction vs. severe resting ischemia in LAD & RCA territory; b) EF by Cath: 35-45%. c) post CABG Echo 9016: EF 50-55%  ? 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 LIMA-OM, RIMA-LAD, SVG-Diag, SVG-rPDA  ? 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 right lung lower lobe -> right lower lobe nodule resection June 2018 (VATS); R thoracotomy with RLL Lobectomy for recurrent PET positive cancer.   Stage T2 a, N0  ? ? ?ALLERGIES:  has No Known Allergies. ? ?MEDICATIONS:  ?  Current Outpatient Medications  ?Medication Sig Dispense Refill  ? aspirin 81 MG tablet Take 81 mg by mouth daily.    ? atorvastatin (LIPITOR) 40 MG tablet TAKE 1 TABLET AT BEDTIME 90 tablet 1  ? carvedilol (COREG) 3.125 MG tablet TAKE 2 TABLETS TWICE A DAY WITH     MEALS 360 tablet 1  ? cimetidine (TAGAMET) 200 MG tablet     ? losartan (COZAAR) 25 MG tablet TAKE 1 TABLET DAILY 90 tablet 1  ? OVER THE COUNTER MEDICATION [employer] sleep aid    ? Pseudoephedrine-APAP 30-325 MG TABS     ?  sildenafil (VIAGRA) 50 MG tablet Take 1 tablet (50 mg total) by mouth daily as needed for erectile dysfunction. 10 tablet 5  ? sucralfate (CARAFATE) 1 g tablet Take 1 tablet (1 g total) by mouth 4 (four) times daily -  with meals and at bedtime. 5 min before meals for radiation induced esophagitis 120 tablet 2  ? acetaminophen (TYLENOL) 325 MG tablet Take 650 mg by mouth every 6 (six) hours as needed for moderate pain. (Patient not taking: Reported on 06/14/2021)    ? lidocaine-prilocaine (EMLA) cream Apply to the Port-A-Cath site 30 minutes before chemotherapy (Patient not taking: Reported on 06/14/2021) 30 g 0  ? prochlorperazine (COMPAZINE) 10 MG tablet Take 1 tablet (10 mg total) by mouth every 6 (six) hours as needed for nausea or vomiting. (Patient not taking: Reported on 06/14/2021) 30 tablet 0  ? ?No current facility-administered medications for this visit.  ? ? ?SURGICAL HISTORY:  ?Past Surgical History:  ?Procedure Laterality Date  ? CARDIAC CATHETERIZATION N/A 06/02/2014  ? Procedure: Left Heart Cath and Coronary Angiography;  Surgeon: Leonie Man, MD;  Location: [facility];  Service: Cardiovascular;  mLAD-100%, mRCA 99%, dRCA 100%, mLCX 90%  And EF 35-45%.  ? CORONARY ARTERY BYPASS GRAFT N/A 06/04/2014  ? Procedure: CORONARY ARTERY BYPASS GRAFTING (CABG) times four using bilateral internal mammary arteries and EVH for left  leg saphenous vein;  Surgeon: Gaye Pollack, MD;  Location: [facility];  Service: Open Heart Surgery;  LIMA-OM, RIMA-LAD, SVG-Diag, SVG-rPDA  ? IR IMAGING GUIDED PORT INSERTION  05/16/2021  ? NM MYOVIEW LTD  05/28/2014  ? Pre-CABG:  High Risk Nuclear Stress Test with multivessel distribution ischemia.  -- Severely ischemic Cardiomyoapthy with evidence of at least 2 vessel disease & EF of ~33%.  Images are consistent with either infarction or severe resting ischemia in the LAD & RCA territory.  ? NM MYOVIEW LTD  03/2018  ? EF 50-55%.  Small basal inferior fixed defect consistent  with diaphragmatic attenuation.  No ischemia or infarction.  Septal dyskinesis due to post CABG state  ? TEE WITHOUT CARDIOVERSION N/A 06/04/2014  ? Procedure: TRANSESOPHAGEAL ECHOCARDIOGRAM (TEE);  Surgeon: Gaye Pollack, MD;  Location: [facility];  Service: Open Heart Surgery;  Laterality: N/A;  ? THORACOTOMY Right 07/10/2016  ? Procedure: THORACOTOMY;  Surgeon: Gaye Pollack, MD;  Location: [facility];  Service: Thoracic;  Laterality: Right;  ? THORACOTOMY/LOBECTOMY Right 07/29/2018  ? Procedure: THORACOTOMY/RIGHT LOWER LOBECTOMY;  Surgeon: Gaye Pollack, MD;  Location: [facility];  Service: Thoracic;  Laterality: Right;  ? TONSILLECTOMY  1957  ? TRANSTHORACIC ECHOCARDIOGRAM  10/2014  ? Mild concentric LVH. EF 50-55% with mild HK of basal anteroseptal myocardium. GR 1 DD.  ? VIDEO ASSISTED THORACOSCOPY (VATS)/WEDGE RESECTION Right 07/10/2016  ? Procedure: VIDEO ASSISTED THORACOSCOPY (VATS)/WEDGE RESECTION;  Surgeon: Gaye Pollack, MD;  Location: [facility] OR;  Service: Thoracic;  Laterality: Right;  ? VIDEO BRONCHOSCOPY WITH ENDOBRONCHIAL NAVIGATION N/A 04/13/2021  ? Procedure: VIDEO BRONCHOSCOPY WITH ENDOBRONCHIAL NAVIGATION;  Surgeon: Melrose Nakayama, MD;  Location: [facility];  Service: Thoracic;  Laterality: N/A;  ? ? ?REVIEW OF SYSTEMS:   ?Review of Systems  ?Constitutional: Positive for appetite change secondary to radiation esophagitis.  Negative for chills, fatigue, fever and unexpected weight change.  ?HENT: Positive for tolerable odynophagia and dysphagia.  Negative for mouth sores, nosebleeds, sore throat and trouble swallowing.   ?Eyes: Negative for eye problems and icterus.  ?Respiratory: Positive for shortness of breath with certain strenuous activities.  Negative for cough, hemoptysis, and wheezing.   ?Cardiovascular: Negative for chest pain and leg swelling.  ?Gastrointestinal: Negative for abdominal pain, constipation, diarrhea, nausea and vomiting.  ?Genitourinary: Negative for bladder incontinence, difficulty  urinating, dysuria, frequency and hematuria.   ?Musculoskeletal: Positive for redness on right forearm.  Negative for back pain, gait problem, neck pain and neck stiffness.  ?Skin: Positive for firmness i

## 2021-06-13 ENCOUNTER — Ambulatory Visit
Admission: RE | Admit: 2021-06-13 | Discharge: 2021-06-13 | Disposition: A | Discharge status: Home / Self Care | Payer: Medicare Other | Source: Ambulatory Visit | Attending: Radiation Oncology | Admitting: Radiation Oncology

## 2021-06-13 ENCOUNTER — Other Ambulatory Visit: Payer: Self-pay

## 2021-06-13 DIAGNOSIS — Z87891 Personal history of nicotine dependence: Secondary | ICD-10-CM | POA: Diagnosis not present

## 2021-06-13 DIAGNOSIS — Z5111 Encounter for antineoplastic chemotherapy: Secondary | ICD-10-CM | POA: Diagnosis not present

## 2021-06-13 DIAGNOSIS — Z79899 Other long term (current) drug therapy: Secondary | ICD-10-CM | POA: Diagnosis not present

## 2021-06-13 DIAGNOSIS — Z510 Encounter for antineoplastic radiation therapy: Secondary | ICD-10-CM | POA: Diagnosis not present

## 2021-06-13 DIAGNOSIS — C3432 Malignant neoplasm of lower lobe, left bronchus or lung: Secondary | ICD-10-CM | POA: Diagnosis not present

## 2021-06-13 DIAGNOSIS — Z5189 Encounter for other specified aftercare: Secondary | ICD-10-CM | POA: Diagnosis not present

## 2021-06-13 LAB — RAD ONC ARIA SESSION SUMMARY
Course Elapsed Days: 39
Plan Fractions Treated to Date: 28
Plan Prescribed Dose Per Fraction: 2 Gy
Plan Total Fractions Prescribed: 33
Plan Total Prescribed Dose: 66 Gy
Reference Point Dosage Given to Date: 56 Gy
Reference Point Session Dosage Given: 2 Gy
Session Number: 28

## 2021-06-13 MED FILL — Fosaprepitant Dimeglumine For IV Infusion 150 MG (Base Eq): INTRAVENOUS | Qty: 5 | Status: AC

## 2021-06-13 MED FILL — Dexamethasone Sodium Phosphate Inj 100 MG/10ML: INTRAMUSCULAR | Qty: 1 | Status: AC

## 2021-06-14 ENCOUNTER — Inpatient Hospital Stay: Payer: Medicare Other

## 2021-06-14 ENCOUNTER — Other Ambulatory Visit: Payer: Self-pay | Admitting: Internal Medicine

## 2021-06-14 ENCOUNTER — Encounter: Payer: Self-pay | Admitting: Internal Medicine

## 2021-06-14 ENCOUNTER — Other Ambulatory Visit: Payer: Self-pay

## 2021-06-14 ENCOUNTER — Ambulatory Visit
Admission: RE | Admit: 2021-06-14 | Discharge: 2021-06-14 | Disposition: A | Discharge status: Home / Self Care | Payer: Medicare Other | Source: Ambulatory Visit | Attending: Radiation Oncology | Admitting: Radiation Oncology

## 2021-06-14 ENCOUNTER — Encounter: Payer: Self-pay | Admitting: Physician Assistant

## 2021-06-14 ENCOUNTER — Inpatient Hospital Stay (HOSPITAL_BASED_OUTPATIENT_CLINIC_OR_DEPARTMENT_OTHER): Payer: Medicare Other | Admitting: Physician Assistant

## 2021-06-14 VITALS — BP 148/90 | HR 81 | Temp 97.4°F | Resp 17 | Wt 163.1 lb

## 2021-06-14 DIAGNOSIS — Z79899 Other long term (current) drug therapy: Secondary | ICD-10-CM | POA: Diagnosis not present

## 2021-06-14 DIAGNOSIS — Z87891 Personal history of nicotine dependence: Secondary | ICD-10-CM | POA: Diagnosis not present

## 2021-06-14 DIAGNOSIS — Z510 Encounter for antineoplastic radiation therapy: Secondary | ICD-10-CM | POA: Diagnosis not present

## 2021-06-14 DIAGNOSIS — C3492 Malignant neoplasm of unspecified part of left bronchus or lung: Secondary | ICD-10-CM

## 2021-06-14 DIAGNOSIS — Z95828 Presence of other vascular implants and grafts: Secondary | ICD-10-CM | POA: Insufficient documentation

## 2021-06-14 DIAGNOSIS — Z5111 Encounter for antineoplastic chemotherapy: Secondary | ICD-10-CM | POA: Diagnosis not present

## 2021-06-14 DIAGNOSIS — I809 Phlebitis and thrombophlebitis of unspecified site: Secondary | ICD-10-CM

## 2021-06-14 DIAGNOSIS — C3432 Malignant neoplasm of lower lobe, left bronchus or lung: Secondary | ICD-10-CM | POA: Diagnosis not present

## 2021-06-14 DIAGNOSIS — Z5189 Encounter for other specified aftercare: Secondary | ICD-10-CM | POA: Diagnosis not present

## 2021-06-14 LAB — CBC WITH DIFFERENTIAL (CANCER CENTER ONLY)
Abs Immature Granulocytes: 0.57 10*3/uL — ABNORMAL HIGH (ref 0.00–0.07)
Basophils Absolute: 0 10*3/uL (ref 0.0–0.1)
Basophils Relative: 0 %
Eosinophils Absolute: 0 10*3/uL (ref 0.0–0.5)
Eosinophils Relative: 0 %
HCT: 34 % — ABNORMAL LOW (ref 39.0–52.0)
Hemoglobin: 11.6 g/dL — ABNORMAL LOW (ref 13.0–17.0)
Immature Granulocytes: 8 %
Lymphocytes Relative: 6 %
Lymphs Abs: 0.5 10*3/uL — ABNORMAL LOW (ref 0.7–4.0)
MCH: 32.3 pg (ref 26.0–34.0)
MCHC: 34.1 g/dL (ref 30.0–36.0)
MCV: 94.7 fL (ref 80.0–100.0)
Monocytes Absolute: 1.3 10*3/uL — ABNORMAL HIGH (ref 0.1–1.0)
Monocytes Relative: 18 %
Neutro Abs: 5.2 10*3/uL (ref 1.7–7.7)
Neutrophils Relative %: 68 %
Platelet Count: 211 10*3/uL (ref 150–400)
RBC: 3.59 MIL/uL — ABNORMAL LOW (ref 4.22–5.81)
RDW: 13.1 % (ref 11.5–15.5)
WBC Count: 7.6 10*3/uL (ref 4.0–10.5)
nRBC: 0 % (ref 0.0–0.2)

## 2021-06-14 LAB — CMP (CANCER CENTER ONLY)
ALT: 15 U/L (ref 0–44)
AST: 18 U/L (ref 15–41)
Albumin: 3.7 g/dL (ref 3.5–5.0)
Alkaline Phosphatase: 98 U/L (ref 38–126)
Anion gap: 7 (ref 5–15)
BUN: 14 mg/dL (ref 8–23)
CO2: 29 mmol/L (ref 22–32)
Calcium: 8.6 mg/dL — ABNORMAL LOW (ref 8.9–10.3)
Chloride: 105 mmol/L (ref 98–111)
Creatinine: 0.7 mg/dL (ref 0.61–1.24)
GFR, Estimated: >60 mL/min (ref >60)
Glucose, Bld: 115 mg/dL — ABNORMAL HIGH (ref 70–99)
Potassium: 3.5 mmol/L (ref 3.5–5.1)
Sodium: 141 mmol/L (ref 135–145)
Total Bilirubin: 0.3 mg/dL (ref 0.3–1.2)
Total Protein: 6.4 g/dL — ABNORMAL LOW (ref 6.5–8.1)

## 2021-06-14 LAB — RAD ONC ARIA SESSION SUMMARY
Course Elapsed Days: 40
Plan Fractions Treated to Date: 29
Plan Prescribed Dose Per Fraction: 2 Gy
Plan Total Fractions Prescribed: 33
Plan Total Prescribed Dose: 66 Gy
Reference Point Dosage Given to Date: 58 Gy
Reference Point Session Dosage Given: 2 Gy
Session Number: 29

## 2021-06-14 MED ORDER — HEPARIN SOD (PORK) LOCK FLUSH 100 UNIT/ML IV SOLN
500.0000 [IU] | Freq: Once | INTRAVENOUS | Status: AC | PRN
  Administered 2021-06-14: 500 [IU]

## 2021-06-14 MED ORDER — SODIUM CHLORIDE 0.9% FLUSH
10.0000 mL | INTRAVENOUS | Status: DC | PRN
  Administered 2021-06-14: 10 mL

## 2021-06-14 MED ORDER — SODIUM CHLORIDE 0.9 % IV SOLN: Freq: Once | INTRAVENOUS | Status: AC

## 2021-06-14 MED ORDER — SODIUM CHLORIDE 0.9 % IV SOLN
100.0000 mg/m2 | Freq: Once | INTRAVENOUS | Status: AC
  Administered 2021-06-14: 190 mg via INTRAVENOUS
  Filled 2021-06-14: qty 9.5

## 2021-06-14 MED ORDER — SODIUM CHLORIDE 0.9 % IV SOLN
482.5000 mg | Freq: Once | INTRAVENOUS | Status: AC
  Administered 2021-06-14: 480 mg via INTRAVENOUS
  Filled 2021-06-14: qty 48

## 2021-06-14 MED ORDER — SODIUM CHLORIDE 0.9 % IV SOLN
10.0000 mg | Freq: Once | INTRAVENOUS | Status: AC
  Administered 2021-06-14: 10 mg via INTRAVENOUS
  Filled 2021-06-14: qty 10

## 2021-06-14 MED ORDER — SODIUM CHLORIDE 0.9 % IV SOLN
150.0000 mg | Freq: Once | INTRAVENOUS | Status: AC
  Administered 2021-06-14: 150 mg via INTRAVENOUS
  Filled 2021-06-14: qty 150

## 2021-06-14 MED ORDER — SODIUM CHLORIDE 0.9% FLUSH
10.0000 mL | Freq: Once | INTRAVENOUS | Status: AC
  Administered 2021-06-14: 10 mL

## 2021-06-14 MED ORDER — PALONOSETRON HCL INJECTION 0.25 MG/5ML
0.2500 mg | Freq: Once | INTRAVENOUS | Status: AC
  Administered 2021-06-14: 0.25 mg via INTRAVENOUS
  Filled 2021-06-14: qty 5

## 2021-06-14 MED FILL — Dexamethasone Sodium Phosphate Inj 100 MG/10ML: INTRAMUSCULAR | Qty: 1 | Status: AC

## 2021-06-14 NOTE — Patient Instructions (Signed)
[facility]  Discharge Instructions: ?Thank you for choosing [facility] to provide your oncology and hematology care.  ? ?If you have a lab appointment with the [facility], please go directly to the [facility] and check in at the registration area. ?  ?Wear comfortable clothing and clothing appropriate for easy access to any Portacath or PICC line.  ? ?We strive to give you quality time with your provider. You may need to reschedule your appointment if you arrive late (15 or more minutes).  Arriving late affects you and other patients whose appointments are after yours.  Also, if you miss three or more appointments without notifying the office, you may be dismissed from the clinic at the provider?s discretion.    ?  ?For prescription refill requests, have your pharmacy contact our office and allow 72 hours for refills to be completed.   ? ?Today you received the following chemotherapy and/or immunotherapy agents: Carboplatin/Etoposide ?  ?To help prevent nausea and vomiting after your treatment, we encourage you to take your nausea medication as directed. ? ?BELOW ARE SYMPTOMS THAT SHOULD BE REPORTED IMMEDIATELY: ?*FEVER GREATER THAN 100.4 F (38 ?C) OR HIGHER ?*CHILLS OR SWEATING ?*NAUSEA AND VOMITING THAT IS NOT CONTROLLED WITH YOUR NAUSEA MEDICATION ?*UNUSUAL SHORTNESS OF BREATH ?*UNUSUAL BRUISING OR BLEEDING ?*URINARY PROBLEMS (pain or burning when urinating, or frequent urination) ?*BOWEL PROBLEMS (unusual diarrhea, constipation, pain near the anus) ?TENDERNESS IN MOUTH AND THROAT WITH OR WITHOUT PRESENCE OF ULCERS (sore throat, sores in mouth, or a toothache) ?UNUSUAL RASH, SWELLING OR PAIN  ?UNUSUAL VAGINAL DISCHARGE OR ITCHING  ? ?Items with * indicate a potential emergency and should be followed up as soon as possible or go to the Emergency Department if any problems should occur. ? ?Please show the CHEMOTHERAPY ALERT CARD or IMMUNOTHERAPY ALERT CARD at  check-in to the Emergency Department and triage nurse. ? ?Should you have questions after your visit or need to cancel or reschedule your appointment, please contact [facility]  Dept: [phone]  and follow the prompts.  Office hours are 8:00 a.m. to 4:30 p.m. Monday - Friday. Please note that voicemails left after 4:00 p.m. may not be returned until the following business day.  We are closed weekends and major holidays. You have access to a nurse at all times for urgent questions. Please call the main number to the clinic Dept: [phone] and follow the prompts. ? ? ?For any non-urgent questions, you may also contact your provider using MyChart. We now offer e-Visits for anyone 37 and older to request care online for non-urgent symptoms. For details visit [email or web address]. ?  ?Also download the MyChart app! Go to the app store, search "MyChart", open the app, select [facility], and log in with your MyChart username and password. ? ?Due to Covid, a mask is required upon entering the hospital/clinic. If you do not have a mask, one will be given to you upon arrival. For doctor visits, patients may have 1 support person aged 23 or older with them. For treatment visits, patients cannot have anyone with them due to current Covid guidelines and our immunocompromised population.  ? ?

## 2021-06-15 ENCOUNTER — Other Ambulatory Visit: Payer: Self-pay

## 2021-06-15 ENCOUNTER — Ambulatory Visit
Admission: RE | Admit: 2021-06-15 | Discharge: 2021-06-15 | Disposition: A | Discharge status: Home / Self Care | Payer: Medicare Other | Source: Ambulatory Visit | Attending: Radiation Oncology | Admitting: Radiation Oncology

## 2021-06-15 ENCOUNTER — Inpatient Hospital Stay: Payer: Medicare Other

## 2021-06-15 VITALS — BP 132/92 | HR 77 | Temp 98.3°F | Resp 16

## 2021-06-15 DIAGNOSIS — Z5111 Encounter for antineoplastic chemotherapy: Secondary | ICD-10-CM | POA: Diagnosis not present

## 2021-06-15 DIAGNOSIS — Z87891 Personal history of nicotine dependence: Secondary | ICD-10-CM | POA: Diagnosis not present

## 2021-06-15 DIAGNOSIS — C3492 Malignant neoplasm of unspecified part of left bronchus or lung: Secondary | ICD-10-CM

## 2021-06-15 DIAGNOSIS — Z79899 Other long term (current) drug therapy: Secondary | ICD-10-CM | POA: Diagnosis not present

## 2021-06-15 DIAGNOSIS — Z5189 Encounter for other specified aftercare: Secondary | ICD-10-CM | POA: Diagnosis not present

## 2021-06-15 DIAGNOSIS — C3432 Malignant neoplasm of lower lobe, left bronchus or lung: Secondary | ICD-10-CM | POA: Diagnosis not present

## 2021-06-15 DIAGNOSIS — Z510 Encounter for antineoplastic radiation therapy: Secondary | ICD-10-CM | POA: Diagnosis not present

## 2021-06-15 LAB — RAD ONC ARIA SESSION SUMMARY
Course Elapsed Days: 41
Plan Fractions Treated to Date: 30
Plan Prescribed Dose Per Fraction: 2 Gy
Plan Total Fractions Prescribed: 33
Plan Total Prescribed Dose: 66 Gy
Reference Point Dosage Given to Date: 60 Gy
Reference Point Session Dosage Given: 2 Gy
Session Number: 30

## 2021-06-15 MED ORDER — HEPARIN SOD (PORK) LOCK FLUSH 100 UNIT/ML IV SOLN
500.0000 [IU] | Freq: Once | INTRAVENOUS | Status: AC | PRN
  Administered 2021-06-15: 500 [IU]

## 2021-06-15 MED ORDER — SODIUM CHLORIDE 0.9% FLUSH
10.0000 mL | INTRAVENOUS | Status: DC | PRN
  Administered 2021-06-15: 10 mL

## 2021-06-15 MED ORDER — SODIUM CHLORIDE 0.9 % IV SOLN
10.0000 mg | Freq: Once | INTRAVENOUS | Status: AC
  Administered 2021-06-15: 10 mg via INTRAVENOUS
  Filled 2021-06-15: qty 10

## 2021-06-15 MED ORDER — SODIUM CHLORIDE 0.9 % IV SOLN
100.0000 mg/m2 | Freq: Once | INTRAVENOUS | Status: AC
  Administered 2021-06-15: 190 mg via INTRAVENOUS
  Filled 2021-06-15: qty 9.5

## 2021-06-15 MED ORDER — SODIUM CHLORIDE 0.9 % IV SOLN: Freq: Once | INTRAVENOUS | Status: AC

## 2021-06-15 MED FILL — Dexamethasone Sodium Phosphate Inj 100 MG/10ML: INTRAMUSCULAR | Qty: 1 | Status: AC

## 2021-06-15 NOTE — Patient Instructions (Signed)
[facility]  Discharge Instructions: ?Thank you for choosing [facility] to provide your oncology and hematology care.  ? ?If you have a lab appointment with the [facility], please go directly to the [facility] and check in at the registration area. ?  ?Wear comfortable clothing and clothing appropriate for easy access to any Portacath or PICC line.  ? ?We strive to give you quality time with your provider. You may need to reschedule your appointment if you arrive late (15 or more minutes).  Arriving late affects you and other patients whose appointments are after yours.  Also, if you miss three or more appointments without notifying the office, you may be dismissed from the clinic at the provider?s discretion.    ?  ?For prescription refill requests, have your pharmacy contact our office and allow 72 hours for refills to be completed.   ? ?Today you received the following chemotherapy and/or immunotherapy agents: Etoposide    ?  ?To help prevent nausea and vomiting after your treatment, we encourage you to take your nausea medication as directed. ? ?BELOW ARE SYMPTOMS THAT SHOULD BE REPORTED IMMEDIATELY: ?*FEVER GREATER THAN 100.4 F (38 ?C) OR HIGHER ?*CHILLS OR SWEATING ?*NAUSEA AND VOMITING THAT IS NOT CONTROLLED WITH YOUR NAUSEA MEDICATION ?*UNUSUAL SHORTNESS OF BREATH ?*UNUSUAL BRUISING OR BLEEDING ?*URINARY PROBLEMS (pain or burning when urinating, or frequent urination) ?*BOWEL PROBLEMS (unusual diarrhea, constipation, pain near the anus) ?TENDERNESS IN MOUTH AND THROAT WITH OR WITHOUT PRESENCE OF ULCERS (sore throat, sores in mouth, or a toothache) ?UNUSUAL RASH, SWELLING OR PAIN  ?UNUSUAL VAGINAL DISCHARGE OR ITCHING  ? ?Items with * indicate a potential emergency and should be followed up as soon as possible or go to the Emergency Department if any problems should occur. ? ?Please show the CHEMOTHERAPY ALERT CARD or IMMUNOTHERAPY ALERT CARD at check-in to  the Emergency Department and triage nurse. ? ?Should you have questions after your visit or need to cancel or reschedule your appointment, please contact [facility]  Dept: [phone]  and follow the prompts.  Office hours are 8:00 a.m. to 4:30 p.m. Monday - Friday. Please note that voicemails left after 4:00 p.m. may not be returned until the following business day.  We are closed weekends and major holidays. You have access to a nurse at all times for urgent questions. Please call the main number to the clinic Dept: [phone] and follow the prompts. ? ? ?For any non-urgent questions, you may also contact your provider using MyChart. We now offer e-Visits for anyone 23 and older to request care online for non-urgent symptoms. For details visit [email or web address]. ?  ?Also download the MyChart app! Go to the app store, search "MyChart", open the app, select , and log in with your MyChart username and password. ? ?Due to Covid, a mask is required upon entering the hospital/clinic. If you do not have a mask, one will be given to you upon arrival. For doctor visits, patients may have 1 support person aged 59 or older with them. For treatment visits, patients cannot have anyone with them due to current Covid guidelines and our immunocompromised population.  ? ?

## 2021-06-16 ENCOUNTER — Ambulatory Visit
Admission: RE | Admit: 2021-06-16 | Discharge: 2021-06-16 | Disposition: A | Discharge status: Home / Self Care | Payer: Medicare Other | Source: Ambulatory Visit | Attending: Radiation Oncology | Admitting: Radiation Oncology

## 2021-06-16 ENCOUNTER — Inpatient Hospital Stay: Payer: Medicare Other

## 2021-06-16 ENCOUNTER — Other Ambulatory Visit: Payer: Self-pay

## 2021-06-16 VITALS — BP 151/91 | HR 72 | Temp 97.8°F | Resp 16

## 2021-06-16 DIAGNOSIS — C3432 Malignant neoplasm of lower lobe, left bronchus or lung: Secondary | ICD-10-CM | POA: Diagnosis not present

## 2021-06-16 DIAGNOSIS — Z510 Encounter for antineoplastic radiation therapy: Secondary | ICD-10-CM | POA: Diagnosis not present

## 2021-06-16 DIAGNOSIS — Z5111 Encounter for antineoplastic chemotherapy: Secondary | ICD-10-CM | POA: Diagnosis not present

## 2021-06-16 DIAGNOSIS — C3492 Malignant neoplasm of unspecified part of left bronchus or lung: Secondary | ICD-10-CM

## 2021-06-16 DIAGNOSIS — Z87891 Personal history of nicotine dependence: Secondary | ICD-10-CM | POA: Diagnosis not present

## 2021-06-16 DIAGNOSIS — Z5189 Encounter for other specified aftercare: Secondary | ICD-10-CM | POA: Diagnosis not present

## 2021-06-16 DIAGNOSIS — Z79899 Other long term (current) drug therapy: Secondary | ICD-10-CM | POA: Diagnosis not present

## 2021-06-16 LAB — RAD ONC ARIA SESSION SUMMARY
Course Elapsed Days: 42
Plan Fractions Treated to Date: 31
Plan Prescribed Dose Per Fraction: 2 Gy
Plan Total Fractions Prescribed: 33
Plan Total Prescribed Dose: 66 Gy
Reference Point Dosage Given to Date: 62 Gy
Reference Point Session Dosage Given: 2 Gy
Session Number: 31

## 2021-06-16 MED ORDER — SODIUM CHLORIDE 0.9% FLUSH
10.0000 mL | INTRAVENOUS | Status: DC | PRN
  Administered 2021-06-16: 10 mL

## 2021-06-16 MED ORDER — HEPARIN SOD (PORK) LOCK FLUSH 100 UNIT/ML IV SOLN
500.0000 [IU] | Freq: Once | INTRAVENOUS | Status: AC | PRN
  Administered 2021-06-16: 500 [IU]

## 2021-06-16 MED ORDER — SODIUM CHLORIDE 0.9 % IV SOLN: Freq: Once | INTRAVENOUS | Status: AC

## 2021-06-16 MED ORDER — SODIUM CHLORIDE 0.9 % IV SOLN
10.0000 mg | Freq: Once | INTRAVENOUS | Status: AC
  Administered 2021-06-16: 10 mg via INTRAVENOUS
  Filled 2021-06-16: qty 10

## 2021-06-16 MED ORDER — SODIUM CHLORIDE 0.9 % IV SOLN
100.0000 mg/m2 | Freq: Once | INTRAVENOUS | Status: AC
  Administered 2021-06-16: 190 mg via INTRAVENOUS
  Filled 2021-06-16: qty 9.5

## 2021-06-16 NOTE — Patient Instructions (Signed)
[facility]  Discharge Instructions: Thank you for choosing [facility] to provide your oncology and hematology care.   If you have a lab appointment with the [facility], please go directly to the [facility] and check in at the registration area.   Wear comfortable clothing and clothing appropriate for easy access to any Portacath or PICC line.   We strive to give you quality time with your provider. You may need to reschedule your appointment if you arrive late (15 or more minutes).  Arriving late affects you and other patients whose appointments are after yours.  Also, if you miss three or more appointments without notifying the office, you may be dismissed from the clinic at the provider's discretion.      For prescription refill requests, have your pharmacy contact our office and allow 72 hours for refills to be completed.    Today you received the following chemotherapy and/or immunotherapy agents: Etoposide      To help prevent nausea and vomiting after your treatment, we encourage you to take your nausea medication as directed.  BELOW ARE SYMPTOMS THAT SHOULD BE REPORTED IMMEDIATELY: *FEVER GREATER THAN 100.4 F (38 C) OR HIGHER *CHILLS OR SWEATING *NAUSEA AND VOMITING THAT IS NOT CONTROLLED WITH YOUR NAUSEA MEDICATION *UNUSUAL SHORTNESS OF BREATH *UNUSUAL BRUISING OR BLEEDING *URINARY PROBLEMS (pain or burning when urinating, or frequent urination) *BOWEL PROBLEMS (unusual diarrhea, constipation, pain near the anus) TENDERNESS IN MOUTH AND THROAT WITH OR WITHOUT PRESENCE OF ULCERS (sore throat, sores in mouth, or a toothache) UNUSUAL RASH, SWELLING OR PAIN  UNUSUAL VAGINAL DISCHARGE OR ITCHING   Items with * indicate a potential emergency and should be followed up as soon as possible or go to the Emergency Department if any problems should occur.  Please show the CHEMOTHERAPY ALERT CARD or IMMUNOTHERAPY ALERT CARD at check-in to  the Emergency Department and triage nurse.  Should you have questions after your visit or need to cancel or reschedule your appointment, please contact [facility]  Dept: [phone]  and follow the prompts.  Office hours are 8:00 a.m. to 4:30 p.m. Monday - Friday. Please note that voicemails left after 4:00 p.m. may not be returned until the following business day.  We are closed weekends and major holidays. You have access to a nurse at all times for urgent questions. Please call the main number to the clinic Dept: [phone] and follow the prompts.   For any non-urgent questions, you may also contact your provider using MyChart. We now offer e-Visits for anyone 13 and older to request care online for non-urgent symptoms. For details visit [email or web address].   Also download the MyChart app! Go to the app store, search "MyChart", open the app, select [facility], and log in with your MyChart username and password.  Due to Covid, a mask is required upon entering the hospital/clinic. If you do not have a mask, one will be given to you upon arrival. For doctor visits, patients may have 1 support person aged 71 or older with them. For treatment visits, patients cannot have anyone with them due to current Covid guidelines and our immunocompromised population.

## 2021-06-17 ENCOUNTER — Other Ambulatory Visit: Payer: Self-pay

## 2021-06-17 ENCOUNTER — Ambulatory Visit
Admission: RE | Admit: 2021-06-17 | Discharge: 2021-06-17 | Disposition: A | Discharge status: Home / Self Care | Payer: Medicare Other | Source: Ambulatory Visit | Attending: Radiation Oncology | Admitting: Radiation Oncology

## 2021-06-17 DIAGNOSIS — Z5111 Encounter for antineoplastic chemotherapy: Secondary | ICD-10-CM | POA: Diagnosis not present

## 2021-06-17 DIAGNOSIS — Z87891 Personal history of nicotine dependence: Secondary | ICD-10-CM | POA: Diagnosis not present

## 2021-06-17 DIAGNOSIS — Z510 Encounter for antineoplastic radiation therapy: Secondary | ICD-10-CM | POA: Diagnosis not present

## 2021-06-17 DIAGNOSIS — Z79899 Other long term (current) drug therapy: Secondary | ICD-10-CM | POA: Diagnosis not present

## 2021-06-17 DIAGNOSIS — Z5189 Encounter for other specified aftercare: Secondary | ICD-10-CM | POA: Diagnosis not present

## 2021-06-17 DIAGNOSIS — C3432 Malignant neoplasm of lower lobe, left bronchus or lung: Secondary | ICD-10-CM | POA: Diagnosis not present

## 2021-06-17 LAB — RAD ONC ARIA SESSION SUMMARY
Course Elapsed Days: 43
Plan Fractions Treated to Date: 32
Plan Prescribed Dose Per Fraction: 2 Gy
Plan Total Fractions Prescribed: 33
Plan Total Prescribed Dose: 66 Gy
Reference Point Dosage Given to Date: 64 Gy
Reference Point Session Dosage Given: 2 Gy
Session Number: 32

## 2021-06-18 ENCOUNTER — Other Ambulatory Visit: Payer: Self-pay

## 2021-06-18 ENCOUNTER — Inpatient Hospital Stay: Payer: Medicare Other

## 2021-06-18 VITALS — BP 121/85 | HR 86 | Temp 97.9°F | Resp 15

## 2021-06-18 DIAGNOSIS — C3492 Malignant neoplasm of unspecified part of left bronchus or lung: Secondary | ICD-10-CM

## 2021-06-18 DIAGNOSIS — Z79899 Other long term (current) drug therapy: Secondary | ICD-10-CM | POA: Diagnosis not present

## 2021-06-18 DIAGNOSIS — Z5111 Encounter for antineoplastic chemotherapy: Secondary | ICD-10-CM | POA: Diagnosis not present

## 2021-06-18 DIAGNOSIS — C3432 Malignant neoplasm of lower lobe, left bronchus or lung: Secondary | ICD-10-CM | POA: Diagnosis not present

## 2021-06-18 DIAGNOSIS — Z5189 Encounter for other specified aftercare: Secondary | ICD-10-CM | POA: Diagnosis not present

## 2021-06-18 MED ORDER — PEGFILGRASTIM-CBQV 6 MG/0.6ML ~~LOC~~ SOSY
6.0000 mg | PREFILLED_SYRINGE | Freq: Once | SUBCUTANEOUS | Status: AC
  Administered 2021-06-18: 6 mg via SUBCUTANEOUS
  Filled 2021-06-18: qty 0.6

## 2021-06-20 ENCOUNTER — Ambulatory Visit
Admission: RE | Admit: 2021-06-20 | Discharge: 2021-06-20 | Disposition: A | Discharge status: Home / Self Care | Payer: Medicare Other | Source: Ambulatory Visit | Attending: Radiation Oncology | Admitting: Radiation Oncology

## 2021-06-20 ENCOUNTER — Other Ambulatory Visit: Payer: Self-pay

## 2021-06-20 ENCOUNTER — Encounter: Payer: Self-pay | Admitting: Urology

## 2021-06-20 DIAGNOSIS — C3432 Malignant neoplasm of lower lobe, left bronchus or lung: Secondary | ICD-10-CM | POA: Diagnosis not present

## 2021-06-20 DIAGNOSIS — Z79899 Other long term (current) drug therapy: Secondary | ICD-10-CM | POA: Diagnosis not present

## 2021-06-20 DIAGNOSIS — Z5189 Encounter for other specified aftercare: Secondary | ICD-10-CM | POA: Diagnosis not present

## 2021-06-20 DIAGNOSIS — Z87891 Personal history of nicotine dependence: Secondary | ICD-10-CM | POA: Diagnosis not present

## 2021-06-20 DIAGNOSIS — C3492 Malignant neoplasm of unspecified part of left bronchus or lung: Secondary | ICD-10-CM

## 2021-06-20 DIAGNOSIS — Z510 Encounter for antineoplastic radiation therapy: Secondary | ICD-10-CM | POA: Diagnosis not present

## 2021-06-20 DIAGNOSIS — Z5111 Encounter for antineoplastic chemotherapy: Secondary | ICD-10-CM | POA: Diagnosis not present

## 2021-06-20 LAB — RAD ONC ARIA SESSION SUMMARY
Course Elapsed Days: 46
Plan Fractions Treated to Date: 33
Plan Prescribed Dose Per Fraction: 2 Gy
Plan Total Fractions Prescribed: 33
Plan Total Prescribed Dose: 66 Gy
Reference Point Dosage Given to Date: 66 Gy
Reference Point Session Dosage Given: 2 Gy
Session Number: 33

## 2021-06-21 ENCOUNTER — Inpatient Hospital Stay: Payer: Medicare Other

## 2021-06-21 DIAGNOSIS — Z5111 Encounter for antineoplastic chemotherapy: Secondary | ICD-10-CM | POA: Diagnosis not present

## 2021-06-21 DIAGNOSIS — Z79899 Other long term (current) drug therapy: Secondary | ICD-10-CM | POA: Diagnosis not present

## 2021-06-21 DIAGNOSIS — C3432 Malignant neoplasm of lower lobe, left bronchus or lung: Secondary | ICD-10-CM | POA: Diagnosis not present

## 2021-06-21 DIAGNOSIS — C3492 Malignant neoplasm of unspecified part of left bronchus or lung: Secondary | ICD-10-CM

## 2021-06-21 DIAGNOSIS — Z5189 Encounter for other specified aftercare: Secondary | ICD-10-CM | POA: Diagnosis not present

## 2021-06-21 LAB — CBC WITH DIFFERENTIAL (CANCER CENTER ONLY)
Abs Immature Granulocytes: 1.28 10*3/uL — ABNORMAL HIGH (ref 0.00–0.07)
Basophils Absolute: 0.1 10*3/uL (ref 0.0–0.1)
Basophils Relative: 1 %
Eosinophils Absolute: 0 10*3/uL (ref 0.0–0.5)
Eosinophils Relative: 0 %
HCT: 34.5 % — ABNORMAL LOW (ref 39.0–52.0)
Hemoglobin: 11.7 g/dL — ABNORMAL LOW (ref 13.0–17.0)
Immature Granulocytes: 12 %
Lymphocytes Relative: 2 %
Lymphs Abs: 0.2 10*3/uL — ABNORMAL LOW (ref 0.7–4.0)
MCH: 32.5 pg (ref 26.0–34.0)
MCHC: 33.9 g/dL (ref 30.0–36.0)
MCV: 95.8 fL (ref 80.0–100.0)
Monocytes Absolute: 0.3 10*3/uL (ref 0.1–1.0)
Monocytes Relative: 2 %
Neutro Abs: 9.1 10*3/uL — ABNORMAL HIGH (ref 1.7–7.7)
Neutrophils Relative %: 83 %
Platelet Count: 245 10*3/uL (ref 150–400)
RBC: 3.6 MIL/uL — ABNORMAL LOW (ref 4.22–5.81)
RDW: 13.3 % (ref 11.5–15.5)
Smear Review: NORMAL
WBC Count: 10.9 10*3/uL — ABNORMAL HIGH (ref 4.0–10.5)
nRBC: 0 % (ref 0.0–0.2)

## 2021-06-21 LAB — CMP (CANCER CENTER ONLY)
ALT: 13 U/L (ref 0–44)
AST: 12 U/L — ABNORMAL LOW (ref 15–41)
Albumin: 3.8 g/dL (ref 3.5–5.0)
Alkaline Phosphatase: 108 U/L (ref 38–126)
Anion gap: 7 (ref 5–15)
BUN: 28 mg/dL — ABNORMAL HIGH (ref 8–23)
CO2: 32 mmol/L (ref 22–32)
Calcium: 10 mg/dL (ref 8.9–10.3)
Chloride: 101 mmol/L (ref 98–111)
Creatinine: 0.82 mg/dL (ref 0.61–1.24)
GFR, Estimated: >60 mL/min (ref >60)
Glucose, Bld: 119 mg/dL — ABNORMAL HIGH (ref 70–99)
Potassium: 4.5 mmol/L (ref 3.5–5.1)
Sodium: 140 mmol/L (ref 135–145)
Total Bilirubin: 1 mg/dL (ref 0.3–1.2)
Total Protein: 6.8 g/dL (ref 6.5–8.1)

## 2021-06-22 ENCOUNTER — Encounter: Payer: Self-pay | Admitting: *Deleted

## 2021-06-22 NOTE — Progress Notes (Signed)
Oncology Nurse Navigator Documentation     06/22/2021   12:00 PM 05/06/2021    3:00 PM 04/29/2021    2:00 PM 04/29/2021    1:00 PM 04/21/2021   10:00 AM 07/20/2016    9:00 AM  Oncology Nurse Navigator Flowsheets  Abnormal Finding Date     03/23/2021   Confirmed Diagnosis Date     04/13/2021   Diagnosis Status    Confirmed Diagnosis Complete Pending Molecular Studies   Planned Course of Treatment     Chemo/Radiation Concurrent   Phase of Treatment Radiation   Radiation Radiation   Chemotherapy Actual Start Date:    05/04/2021    Radiation Actual Start Date:    05/05/2021    Radiation Expected End Date: 06/20/2021       Navigator Follow Up Date: 07/05/2021   05/25/2021 04/27/2021   Navigator Follow Up Reason: Follow-up Appointment   Follow-up Appointment New Patient Appointment   Navigator Location [facility]   Referral Date to RadOnc/MedOnc     04/21/2021   Navigator Encounter Type  Appt/Treatment Plan Review Telephone Appt/Treatment Plan Review;Initial MedOnc;Clinic/MDC Pathology Review;Other: Other  Telephone   Outgoing Call     Treatment Initiated Date    05/04/2021    Patient Visit Type  Other Other Other;MedOnc;Initial Other   Treatment Phase Treatment   Pre-Tx/Tx Discussion Pre-Tx/Tx Discussion   Barriers/Navigation Needs Coordination of Care/I followed up on Kelly Olson tx plan.  He completed his XRT and continue systemic therapy.  Coordination of Care Education;Coordination of Care Coordination of Care;Education Coordination of Care Coordination of Care  Education   Other Understanding Cancer/ Treatment Options;Newly Diagnosed Cancer Education    Interventions Coordination of Care Coordination of Care Education;Coordination of Care Coordination of Care;Education;Psycho-Social Support Coordination of Care Coordination of Care  Acuity Level 2-Minimal Needs (1-2 Barriers Identified) Level 2-Minimal Needs (1-2 Barriers Identified)  Level 3-Moderate Needs (3-4 Barriers Identified) Level 3-Moderate Needs (3-4 Barriers Identified) Level 2-Minimal Needs (1-2 Barriers Identified) Level 1  Coordination of Care Other Other Appts Other Other;Pathology Other  Education Method   Verbal Verbal;Other    Time Spent with Patient [phone]

## 2021-06-28 ENCOUNTER — Inpatient Hospital Stay: Payer: Medicare Other

## 2021-06-28 ENCOUNTER — Other Ambulatory Visit: Payer: Self-pay

## 2021-06-28 DIAGNOSIS — Z5189 Encounter for other specified aftercare: Secondary | ICD-10-CM | POA: Diagnosis not present

## 2021-06-28 DIAGNOSIS — Z79899 Other long term (current) drug therapy: Secondary | ICD-10-CM | POA: Diagnosis not present

## 2021-06-28 DIAGNOSIS — C3492 Malignant neoplasm of unspecified part of left bronchus or lung: Secondary | ICD-10-CM

## 2021-06-28 DIAGNOSIS — C3432 Malignant neoplasm of lower lobe, left bronchus or lung: Secondary | ICD-10-CM | POA: Diagnosis not present

## 2021-06-28 DIAGNOSIS — Z5111 Encounter for antineoplastic chemotherapy: Secondary | ICD-10-CM | POA: Diagnosis not present

## 2021-06-28 LAB — CBC WITH DIFFERENTIAL (CANCER CENTER ONLY)
Abs Immature Granulocytes: 1.38 10*3/uL — ABNORMAL HIGH (ref 0.00–0.07)
Basophils Absolute: 0 10*3/uL (ref 0.0–0.1)
Basophils Relative: 0 %
Eosinophils Absolute: 0 10*3/uL (ref 0.0–0.5)
Eosinophils Relative: 0 %
HCT: 31.6 % — ABNORMAL LOW (ref 39.0–52.0)
Hemoglobin: 10.7 g/dL — ABNORMAL LOW (ref 13.0–17.0)
Immature Granulocytes: 7 %
Lymphocytes Relative: 2 %
Lymphs Abs: 0.5 10*3/uL — ABNORMAL LOW (ref 0.7–4.0)
MCH: 33 pg (ref 26.0–34.0)
MCHC: 33.9 g/dL (ref 30.0–36.0)
MCV: 97.5 fL (ref 80.0–100.0)
Monocytes Absolute: 3 10*3/uL — ABNORMAL HIGH (ref 0.1–1.0)
Monocytes Relative: 16 %
Neutro Abs: 13.7 10*3/uL — ABNORMAL HIGH (ref 1.7–7.7)
Neutrophils Relative %: 75 %
Platelet Count: 64 10*3/uL — ABNORMAL LOW (ref 150–400)
RBC: 3.24 MIL/uL — ABNORMAL LOW (ref 4.22–5.81)
RDW: 14.1 % (ref 11.5–15.5)
Smear Review: NORMAL
WBC Count: 18.6 10*3/uL — ABNORMAL HIGH (ref 4.0–10.5)
nRBC: 0 % (ref 0.0–0.2)

## 2021-06-28 LAB — CMP (CANCER CENTER ONLY)
ALT: 18 U/L (ref 0–44)
AST: 17 U/L (ref 15–41)
Albumin: 3.7 g/dL (ref 3.5–5.0)
Alkaline Phosphatase: 128 U/L — ABNORMAL HIGH (ref 38–126)
Anion gap: 6 (ref 5–15)
BUN: 22 mg/dL (ref 8–23)
CO2: 31 mmol/L (ref 22–32)
Calcium: 9.5 mg/dL (ref 8.9–10.3)
Chloride: 103 mmol/L (ref 98–111)
Creatinine: 0.85 mg/dL (ref 0.61–1.24)
GFR, Estimated: >60 mL/min (ref >60)
Glucose, Bld: 115 mg/dL — ABNORMAL HIGH (ref 70–99)
Potassium: 3.7 mmol/L (ref 3.5–5.1)
Sodium: 140 mmol/L (ref 135–145)
Total Bilirubin: 0.2 mg/dL — ABNORMAL LOW (ref 0.3–1.2)
Total Protein: 6.4 g/dL — ABNORMAL LOW (ref 6.5–8.1)

## 2021-07-04 MED FILL — Fosaprepitant Dimeglumine For IV Infusion 150 MG (Base Eq): INTRAVENOUS | Qty: 5 | Status: AC

## 2021-07-04 MED FILL — Dexamethasone Sodium Phosphate Inj 100 MG/10ML: INTRAMUSCULAR | Qty: 1 | Status: AC

## 2021-07-05 ENCOUNTER — Encounter: Payer: Self-pay | Admitting: Internal Medicine

## 2021-07-05 ENCOUNTER — Inpatient Hospital Stay: Payer: Medicare Other | Attending: Internal Medicine

## 2021-07-05 ENCOUNTER — Inpatient Hospital Stay: Payer: Medicare Other

## 2021-07-05 ENCOUNTER — Inpatient Hospital Stay (HOSPITAL_BASED_OUTPATIENT_CLINIC_OR_DEPARTMENT_OTHER): Payer: Medicare Other | Admitting: Internal Medicine

## 2021-07-05 ENCOUNTER — Other Ambulatory Visit: Payer: Self-pay

## 2021-07-05 VITALS — BP 131/76 | HR 91 | Temp 97.5°F | Resp 18 | Ht 71.0 in | Wt 161.4 lb

## 2021-07-05 DIAGNOSIS — Z79899 Other long term (current) drug therapy: Secondary | ICD-10-CM | POA: Diagnosis not present

## 2021-07-05 DIAGNOSIS — C3490 Malignant neoplasm of unspecified part of unspecified bronchus or lung: Secondary | ICD-10-CM | POA: Diagnosis not present

## 2021-07-05 DIAGNOSIS — Z5111 Encounter for antineoplastic chemotherapy: Secondary | ICD-10-CM | POA: Insufficient documentation

## 2021-07-05 DIAGNOSIS — C3492 Malignant neoplasm of unspecified part of left bronchus or lung: Secondary | ICD-10-CM

## 2021-07-05 DIAGNOSIS — C3432 Malignant neoplasm of lower lobe, left bronchus or lung: Secondary | ICD-10-CM | POA: Diagnosis not present

## 2021-07-05 DIAGNOSIS — Z5189 Encounter for other specified aftercare: Secondary | ICD-10-CM | POA: Diagnosis not present

## 2021-07-05 LAB — CMP (CANCER CENTER ONLY)
ALT: 16 U/L (ref 0–44)
AST: 17 U/L (ref 15–41)
Albumin: 3.9 g/dL (ref 3.5–5.0)
Alkaline Phosphatase: 95 U/L (ref 38–126)
Anion gap: 6 (ref 5–15)
BUN: 22 mg/dL (ref 8–23)
CO2: 28 mmol/L (ref 22–32)
Calcium: 8.9 mg/dL (ref 8.9–10.3)
Chloride: 107 mmol/L (ref 98–111)
Creatinine: 0.87 mg/dL (ref 0.61–1.24)
GFR, Estimated: >60 mL/min (ref >60)
Glucose, Bld: 144 mg/dL — ABNORMAL HIGH (ref 70–99)
Potassium: 3.9 mmol/L (ref 3.5–5.1)
Sodium: 141 mmol/L (ref 135–145)
Total Bilirubin: 0.3 mg/dL (ref 0.3–1.2)
Total Protein: 6.3 g/dL — ABNORMAL LOW (ref 6.5–8.1)

## 2021-07-05 LAB — CBC WITH DIFFERENTIAL (CANCER CENTER ONLY)
Abs Immature Granulocytes: 0.15 10*3/uL — ABNORMAL HIGH (ref 0.00–0.07)
Basophils Absolute: 0.1 10*3/uL (ref 0.0–0.1)
Basophils Relative: 1 %
Eosinophils Absolute: 0.1 10*3/uL (ref 0.0–0.5)
Eosinophils Relative: 1 %
HCT: 31.7 % — ABNORMAL LOW (ref 39.0–52.0)
Hemoglobin: 10.5 g/dL — ABNORMAL LOW (ref 13.0–17.0)
Immature Granulocytes: 2 %
Lymphocytes Relative: 7 %
Lymphs Abs: 0.7 10*3/uL (ref 0.7–4.0)
MCH: 32.9 pg (ref 26.0–34.0)
MCHC: 33.1 g/dL (ref 30.0–36.0)
MCV: 99.4 fL (ref 80.0–100.0)
Monocytes Absolute: 1.1 10*3/uL — ABNORMAL HIGH (ref 0.1–1.0)
Monocytes Relative: 11 %
Neutro Abs: 8.2 10*3/uL — ABNORMAL HIGH (ref 1.7–7.7)
Neutrophils Relative %: 78 %
Platelet Count: 191 10*3/uL (ref 150–400)
RBC: 3.19 MIL/uL — ABNORMAL LOW (ref 4.22–5.81)
RDW: 15.9 % — ABNORMAL HIGH (ref 11.5–15.5)
WBC Count: 10.3 10*3/uL (ref 4.0–10.5)
nRBC: 0 % (ref 0.0–0.2)

## 2021-07-05 MED ORDER — SODIUM CHLORIDE 0.9 % IV SOLN
100.0000 mg/m2 | Freq: Once | INTRAVENOUS | Status: AC
  Administered 2021-07-05: 190 mg via INTRAVENOUS
  Filled 2021-07-05: qty 9.5

## 2021-07-05 MED ORDER — PALONOSETRON HCL INJECTION 0.25 MG/5ML
0.2500 mg | Freq: Once | INTRAVENOUS | Status: AC
  Administered 2021-07-05: 0.25 mg via INTRAVENOUS
  Filled 2021-07-05: qty 5

## 2021-07-05 MED ORDER — SODIUM CHLORIDE 0.9 % IV SOLN: Freq: Once | INTRAVENOUS | Status: AC

## 2021-07-05 MED ORDER — SODIUM CHLORIDE 0.9 % IV SOLN
10.0000 mg | Freq: Once | INTRAVENOUS | Status: AC
  Administered 2021-07-05: 10 mg via INTRAVENOUS
  Filled 2021-07-05: qty 10

## 2021-07-05 MED ORDER — SODIUM CHLORIDE 0.9 % IV SOLN
482.5000 mg | Freq: Once | INTRAVENOUS | Status: AC
  Administered 2021-07-05: 480 mg via INTRAVENOUS
  Filled 2021-07-05: qty 48

## 2021-07-05 MED ORDER — HEPARIN SOD (PORK) LOCK FLUSH 100 UNIT/ML IV SOLN
500.0000 [IU] | Freq: Once | INTRAVENOUS | Status: AC | PRN
  Administered 2021-07-05: 500 [IU]

## 2021-07-05 MED ORDER — SODIUM CHLORIDE 0.9% FLUSH
10.0000 mL | INTRAVENOUS | Status: DC | PRN
  Administered 2021-07-05: 10 mL

## 2021-07-05 MED ORDER — SODIUM CHLORIDE 0.9 % IV SOLN
150.0000 mg | Freq: Once | INTRAVENOUS | Status: AC
  Administered 2021-07-05: 150 mg via INTRAVENOUS
  Filled 2021-07-05: qty 150

## 2021-07-05 MED FILL — Dexamethasone Sodium Phosphate Inj 100 MG/10ML: INTRAMUSCULAR | Qty: 1 | Status: AC

## 2021-07-05 NOTE — Progress Notes (Signed)
[facility] [phone]   [phone]  OFFICE PROGRESS NOTE  [name], MD [address]  [place] [address]  DIAGNOSIS: Unresectable limited stage IIb (T3, N0, M0) small cell lung cancer presented with 2 pulmonary nodules in the left lower lobe with no evidence of metastatic disease to the lymph nodes or distant metastasis diagnosed in March 2023  PRIOR THERAPY: None  CURRENT THERAPY: Systemic chemotherapy with carboplatin for AUC of 5 and etoposide 100 Mg/M2 on days 1, 2 and 3 every 3 weeks.  First dose started 05/02/2021.  Status post 3 cycles.  This is concurrent with radiotherapy under the care of Dr. Tammi Klippel  INTERVAL HISTORY: Kelly Olson. 71 y.o. male returns to the clinic today for follow-up visit accompanied by his wife.  The patient is feeling fine today with no concerning complaints.  He has chemotherapy-induced alopecia.  He denied having any chest pain, shortness of breath, cough or hemoptysis.  He denied having any fever or chills.  He has no nausea, vomiting, diarrhea or constipation.  He has no headache or visual changes.  He is here today for evaluation before the last cycle of his treatment.  MEDICAL HISTORY: Past Medical History:  Diagnosis Date   Coronary artery disease, occlusive 06/02/2014   Multivessel CAD.mLAD-100%, mRCA 99%, dRCA 100%, mLCX 90%  And EF 35-45%. -->  Referred for CABG; nonischemic Myoview March 2020   Former heavy tobacco smoker    Quit in April 2016    GERD (gastroesophageal reflux disease)    Hyperlipidemia with target LDL less than 70    Hypertension    Ischemic cardiomyopathy - resolved 05/2014   Myoview: EF ~33% with "infarction vs. severe resting ischemia in LAD & RCA territory; b) EF by Cath: 35-45%. 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   Stage T2 a, N0    ALLERGIES:  has No Known Allergies.  MEDICATIONS:  Current Outpatient Medications  Medication Sig Dispense Refill   acetaminophen (TYLENOL) 325 MG tablet Take 650 mg by mouth every 6 (six) hours as needed for moderate pain. (Patient not taking: Reported on 06/14/2021)     aspirin 81 MG tablet Take 81 mg by mouth daily.     atorvastatin (LIPITOR) 40 MG tablet TAKE 1 TABLET AT BEDTIME 90 tablet 1   carvedilol (COREG) 3.125 MG tablet TAKE 2 TABLETS TWICE A DAY WITH     MEALS 360 tablet 1   cimetidine (TAGAMET) 200 MG tablet      lidocaine-prilocaine (EMLA) cream Apply to the Port-A-Cath site 30 minutes before chemotherapy (Patient not taking: Reported on 06/14/2021) 30 g 0   losartan (COZAAR) 25 MG tablet TAKE 1 TABLET DAILY 90 tablet 1   OVER THE COUNTER MEDICATION [employer] sleep aid     prochlorperazine (COMPAZINE) 10 MG tablet Take 1 tablet (10 mg total) by mouth every 6 (six) hours as needed for nausea or vomiting. (Patient not taking: Reported on 06/14/2021) 30 tablet 0   Pseudoephedrine-APAP 30-325 MG TABS      sildenafil (VIAGRA) 50 MG tablet Take 1 tablet (50 mg total) by mouth daily as needed for  erectile dysfunction. 10 tablet 5   sucralfate (CARAFATE) 1 g tablet Take 1 tablet (1 g total) by mouth 4 (four) times daily -  with meals and at bedtime. 5 min before meals for radiation induced esophagitis 120 tablet 2   No current facility-administered medications for this visit.    SURGICAL HISTORY:  Past Surgical History:  Procedure Laterality Date   CARDIAC CATHETERIZATION N/A 06/02/2014   Procedure: Left Heart Cath and Coronary Angiography;  Surgeon: Leonie Man, MD;  Location: [facility] INVASIVE CV LAB CUPID;  Service: Cardiovascular;  mLAD-100%, mRCA 99%, dRCA 100%, mLCX 90%  And EF 35-45%.   CORONARY ARTERY BYPASS GRAFT N/A  06/04/2014   Procedure: CORONARY ARTERY BYPASS GRAFTING (CABG) times four using bilateral internal mammary arteries and EVH for left  leg saphenous vein;  Surgeon: Gaye Pollack, MD;  Location: [facility] OR;  Service: Open Heart Surgery;  LIMA-OM, RIMA-LAD, SVG-Diag, SVG-rPDA   IR IMAGING GUIDED PORT INSERTION  05/16/2021   NM MYOVIEW LTD  05/28/2014   Pre-CABG:  High Risk Nuclear Stress Test with multivessel distribution ischemia.  -- Severely ischemic Cardiomyoapthy with evidence of at least 2 vessel disease & EF of ~33%.  Images are consistent with either infarction or severe resting ischemia in the LAD & RCA territory.   NM MYOVIEW LTD  03/2018   EF 50-55%.  Small basal inferior fixed defect consistent with diaphragmatic attenuation.  No ischemia or infarction.  Septal dyskinesis due to post CABG state   TEE WITHOUT CARDIOVERSION N/A 06/04/2014   Procedure: TRANSESOPHAGEAL ECHOCARDIOGRAM (TEE);  Surgeon: Gaye Pollack, MD;  Location: [facility];  Service: Open Heart Surgery;  Laterality: N/A;   THORACOTOMY Right 07/10/2016   Procedure: THORACOTOMY;  Surgeon: Gaye Pollack, MD;  Location: [facility] OR;  Service: Thoracic;  Laterality: Right;   THORACOTOMY/LOBECTOMY Right 07/29/2018   Procedure: THORACOTOMY/RIGHT LOWER LOBECTOMY;  Surgeon: Gaye Pollack, MD;  Location: [facility] OR;  Service: Thoracic;  Laterality: Right;   [place] ECHOCARDIOGRAM  10/2014   Mild concentric LVH. EF 50-55% with mild HK of basal anteroseptal myocardium. GR 1 DD.   VIDEO ASSISTED THORACOSCOPY (VATS)/WEDGE RESECTION Right 07/10/2016   Procedure: VIDEO ASSISTED THORACOSCOPY (VATS)/WEDGE RESECTION;  Surgeon: Gaye Pollack, MD;  Location: [facility] OR;  Service: Thoracic;  Laterality: Right;   VIDEO BRONCHOSCOPY WITH ENDOBRONCHIAL NAVIGATION N/A 04/13/2021   Procedure: VIDEO BRONCHOSCOPY WITH ENDOBRONCHIAL NAVIGATION;  Surgeon: Melrose Nakayama, MD;  Location: [facility];  Service: Thoracic;  Laterality: N/A;    REVIEW OF  SYSTEMS:  A comprehensive review of systems was negative except for: Constitutional: positive for fatigue   PHYSICAL EXAMINATION: General appearance: alert, cooperative, and no distress Head: Normocephalic, without obvious abnormality, atraumatic Neck: no adenopathy, no JVD, supple, symmetrical, trachea midline, and thyroid not enlarged, symmetric, no tenderness/mass/nodules Lymph nodes: Cervical, supraclavicular, and axillary nodes normal. Resp: clear to auscultation bilaterally Back: symmetric, no curvature. ROM normal. No CVA tenderness. Cardio: regular rate and rhythm, S1, S2 normal, no murmur, click, rub or gallop GI: soft, non-tender; bowel sounds normal; no masses,  no organomegaly Extremities: extremities normal, atraumatic, no cyanosis or edema  ECOG PERFORMANCE STATUS: 1 - Symptomatic but completely ambulatory  Blood pressure 131/76, pulse 91, temperature (!) 97.5 F (36.4 C), temperature source Oral, resp. rate 18, height 5\' 11"  (1.803 m), weight 161 lb 6.4 oz (73.2 kg), SpO2 96 %.  LABORATORY DATA: Lab Results  Component Value Date   WBC 10.3 07/05/2021  HGB 10.5 (L) 07/05/2021   HCT 31.7 (L) 07/05/2021   MCV 99.4 07/05/2021   PLT 191 07/05/2021      Chemistry      Component Value Date/Time   NA 141 07/05/2021 1012   NA 142 04/08/2018 0858   K 3.9 07/05/2021 1012   CL 107 07/05/2021 1012   CO2 28 07/05/2021 1012   BUN 22 07/05/2021 1012   BUN 24 04/08/2018 0858   CREATININE 0.87 07/05/2021 1012   CREATININE 0.85 01/03/2016 0840      Component Value Date/Time   CALCIUM 8.9 07/05/2021 1012   ALKPHOS 95 07/05/2021 1012   AST 17 07/05/2021 1012   ALT 16 07/05/2021 1012   BILITOT 0.3 07/05/2021 1012       RADIOGRAPHIC STUDIES: No results found.  ASSESSMENT AND PLAN: This is a very pleasant 71 years old white male diagnosed with unresectable limited stage (T3, N0, M0 small cell lung cancer presented with 2 nodules in the left lower lobe with no evidence of  metastatic disease to the thoracic lymph nodes or distant metastases diagnosed in March 2023. The patient is currently undergoing a course of systemic chemotherapy with carboplatin for AUC of 5 on day 1 and etoposide 100 Mg/M2 on days 1, 2 and 3 status post 3 cycles.  This is concurrent with radiotherapy.   The patient has been tolerating this treatment well with no concerning adverse effect except for some recent fatigue. I recommended for him to proceed with cycle #4 today as planned. I will see him back for follow-up visit in 1 months for evaluation with repeat CT scan of the chest for restaging of his disease. The patient was advised to call immediately if he has any other concerning symptoms in the interval. The patient voices understanding of current disease status and treatment options and is in agreement with the current care plan.  All questions were answered. The patient knows to call the clinic with any problems, questions or concerns. We can certainly see the patient much sooner if necessary.  The total time spent in the appointment was 20 minutes.  Disclaimer: This note was dictated with voice recognition software. Similar sounding words can inadvertently be transcribed and may not be corrected upon review.

## 2021-07-06 ENCOUNTER — Inpatient Hospital Stay: Payer: Medicare Other

## 2021-07-06 VITALS — BP 143/85 | HR 97 | Temp 98.4°F | Resp 17

## 2021-07-06 DIAGNOSIS — Z79899 Other long term (current) drug therapy: Secondary | ICD-10-CM | POA: Diagnosis not present

## 2021-07-06 DIAGNOSIS — Z5111 Encounter for antineoplastic chemotherapy: Secondary | ICD-10-CM | POA: Diagnosis not present

## 2021-07-06 DIAGNOSIS — C3492 Malignant neoplasm of unspecified part of left bronchus or lung: Secondary | ICD-10-CM

## 2021-07-06 DIAGNOSIS — Z5189 Encounter for other specified aftercare: Secondary | ICD-10-CM | POA: Diagnosis not present

## 2021-07-06 DIAGNOSIS — C3432 Malignant neoplasm of lower lobe, left bronchus or lung: Secondary | ICD-10-CM | POA: Diagnosis not present

## 2021-07-06 MED ORDER — SODIUM CHLORIDE 0.9% FLUSH
10.0000 mL | INTRAVENOUS | Status: DC | PRN
  Administered 2021-07-06: 10 mL

## 2021-07-06 MED ORDER — HEPARIN SOD (PORK) LOCK FLUSH 100 UNIT/ML IV SOLN
500.0000 [IU] | Freq: Once | INTRAVENOUS | Status: AC | PRN
  Administered 2021-07-06: 500 [IU]

## 2021-07-06 MED ORDER — SODIUM CHLORIDE 0.9 % IV SOLN
10.0000 mg | Freq: Once | INTRAVENOUS | Status: AC
  Administered 2021-07-06: 10 mg via INTRAVENOUS
  Filled 2021-07-06: qty 10

## 2021-07-06 MED ORDER — SODIUM CHLORIDE 0.9 % IV SOLN
100.0000 mg/m2 | Freq: Once | INTRAVENOUS | Status: AC
  Administered 2021-07-06: 190 mg via INTRAVENOUS
  Filled 2021-07-06: qty 9.5

## 2021-07-06 MED ORDER — SODIUM CHLORIDE 0.9 % IV SOLN: Freq: Once | INTRAVENOUS | Status: AC

## 2021-07-06 MED FILL — Dexamethasone Sodium Phosphate Inj 100 MG/10ML: INTRAMUSCULAR | Qty: 1 | Status: AC

## 2021-07-07 ENCOUNTER — Other Ambulatory Visit: Payer: Self-pay

## 2021-07-07 ENCOUNTER — Inpatient Hospital Stay: Payer: Medicare Other

## 2021-07-07 VITALS — BP 136/78 | HR 82 | Temp 98.0°F | Resp 18

## 2021-07-07 DIAGNOSIS — C3432 Malignant neoplasm of lower lobe, left bronchus or lung: Secondary | ICD-10-CM | POA: Diagnosis not present

## 2021-07-07 DIAGNOSIS — Z5189 Encounter for other specified aftercare: Secondary | ICD-10-CM | POA: Diagnosis not present

## 2021-07-07 DIAGNOSIS — C3492 Malignant neoplasm of unspecified part of left bronchus or lung: Secondary | ICD-10-CM

## 2021-07-07 DIAGNOSIS — Z79899 Other long term (current) drug therapy: Secondary | ICD-10-CM | POA: Diagnosis not present

## 2021-07-07 DIAGNOSIS — Z5111 Encounter for antineoplastic chemotherapy: Secondary | ICD-10-CM | POA: Diagnosis not present

## 2021-07-07 MED ORDER — SODIUM CHLORIDE 0.9 % IV SOLN
10.0000 mg | Freq: Once | INTRAVENOUS | Status: AC
  Administered 2021-07-07: 10 mg via INTRAVENOUS
  Filled 2021-07-07: qty 10

## 2021-07-07 MED ORDER — SODIUM CHLORIDE 0.9% FLUSH
10.0000 mL | INTRAVENOUS | Status: DC | PRN
  Administered 2021-07-07: 10 mL

## 2021-07-07 MED ORDER — SODIUM CHLORIDE 0.9 % IV SOLN: Freq: Once | INTRAVENOUS | Status: AC

## 2021-07-07 MED ORDER — SODIUM CHLORIDE 0.9 % IV SOLN
100.0000 mg/m2 | Freq: Once | INTRAVENOUS | Status: AC
  Administered 2021-07-07: 190 mg via INTRAVENOUS
  Filled 2021-07-07: qty 9.5

## 2021-07-07 MED ORDER — HEPARIN SOD (PORK) LOCK FLUSH 100 UNIT/ML IV SOLN
500.0000 [IU] | Freq: Once | INTRAVENOUS | Status: AC | PRN
  Administered 2021-07-07: 500 [IU]

## 2021-07-07 NOTE — Patient Instructions (Signed)
[facility]  Discharge Instructions: Thank you for choosing [facility] to provide your oncology and hematology care.   If you have a lab appointment with the [facility], please go directly to the [facility] and check in at the registration area.   Wear comfortable clothing and clothing appropriate for easy access to any Portacath or PICC line.   We strive to give you quality time with your provider. You may need to reschedule your appointment if you arrive late (15 or more minutes).  Arriving late affects you and other patients whose appointments are after yours.  Also, if you miss three or more appointments without notifying the office, you may be dismissed from the clinic at the provider's discretion.      For prescription refill requests, have your pharmacy contact our office and allow 72 hours for refills to be completed.    Today you received the following chemotherapy and/or immunotherapy agents: Etoposide      To help prevent nausea and vomiting after your treatment, we encourage you to take your nausea medication as directed.  BELOW ARE SYMPTOMS THAT SHOULD BE REPORTED IMMEDIATELY: *FEVER GREATER THAN 100.4 F (38 C) OR HIGHER *CHILLS OR SWEATING *NAUSEA AND VOMITING THAT IS NOT CONTROLLED WITH YOUR NAUSEA MEDICATION *UNUSUAL SHORTNESS OF BREATH *UNUSUAL BRUISING OR BLEEDING *URINARY PROBLEMS (pain or burning when urinating, or frequent urination) *BOWEL PROBLEMS (unusual diarrhea, constipation, pain near the anus) TENDERNESS IN MOUTH AND THROAT WITH OR WITHOUT PRESENCE OF ULCERS (sore throat, sores in mouth, or a toothache) UNUSUAL RASH, SWELLING OR PAIN  UNUSUAL VAGINAL DISCHARGE OR ITCHING   Items with * indicate a potential emergency and should be followed up as soon as possible or go to the Emergency Department if any problems should occur.  Please show the CHEMOTHERAPY ALERT CARD or IMMUNOTHERAPY ALERT CARD at check-in to  the Emergency Department and triage nurse.  Should you have questions after your visit or need to cancel or reschedule your appointment, please contact [facility]  Dept: [phone]  and follow the prompts.  Office hours are 8:00 a.m. to 4:30 p.m. Monday - Friday. Please note that voicemails left after 4:00 p.m. may not be returned until the following business day.  We are closed weekends and major holidays. You have access to a nurse at all times for urgent questions. Please call the main number to the clinic Dept: [phone] and follow the prompts.   For any non-urgent questions, you may also contact your provider using MyChart. We now offer e-Visits for anyone 24 and older to request care online for non-urgent symptoms. For details visit [email or web address].   Also download the MyChart app! Go to the app store, search "MyChart", open the app, select [facility], and log in with your MyChart username and password.  Due to Covid, a mask is required upon entering the hospital/clinic. If you do not have a mask, one will be given to you upon arrival. For doctor visits, patients may have 1 support person aged 70 or older with them. For treatment visits, patients cannot have anyone with them due to current Covid guidelines and our immunocompromised population.

## 2021-07-09 ENCOUNTER — Inpatient Hospital Stay: Payer: Medicare Other

## 2021-07-09 VITALS — BP 110/77 | HR 75 | Temp 97.9°F | Resp 20

## 2021-07-09 DIAGNOSIS — Z79899 Other long term (current) drug therapy: Secondary | ICD-10-CM | POA: Diagnosis not present

## 2021-07-09 DIAGNOSIS — C3492 Malignant neoplasm of unspecified part of left bronchus or lung: Secondary | ICD-10-CM

## 2021-07-09 DIAGNOSIS — Z5111 Encounter for antineoplastic chemotherapy: Secondary | ICD-10-CM | POA: Diagnosis not present

## 2021-07-09 DIAGNOSIS — C3432 Malignant neoplasm of lower lobe, left bronchus or lung: Secondary | ICD-10-CM | POA: Diagnosis not present

## 2021-07-09 DIAGNOSIS — Z5189 Encounter for other specified aftercare: Secondary | ICD-10-CM | POA: Diagnosis not present

## 2021-07-09 MED ORDER — PEGFILGRASTIM-CBQV 6 MG/0.6ML ~~LOC~~ SOSY
6.0000 mg | PREFILLED_SYRINGE | Freq: Once | SUBCUTANEOUS | Status: AC
  Administered 2021-07-09: 6 mg via SUBCUTANEOUS
  Filled 2021-07-09: qty 0.6

## 2021-07-12 ENCOUNTER — Other Ambulatory Visit: Payer: Self-pay

## 2021-07-12 ENCOUNTER — Inpatient Hospital Stay: Payer: Medicare Other

## 2021-07-12 ENCOUNTER — Other Ambulatory Visit: Payer: Self-pay | Admitting: Lab

## 2021-07-12 DIAGNOSIS — C3432 Malignant neoplasm of lower lobe, left bronchus or lung: Secondary | ICD-10-CM | POA: Diagnosis not present

## 2021-07-12 DIAGNOSIS — Z5111 Encounter for antineoplastic chemotherapy: Secondary | ICD-10-CM | POA: Diagnosis not present

## 2021-07-12 DIAGNOSIS — Z79899 Other long term (current) drug therapy: Secondary | ICD-10-CM | POA: Diagnosis not present

## 2021-07-12 DIAGNOSIS — C3492 Malignant neoplasm of unspecified part of left bronchus or lung: Secondary | ICD-10-CM

## 2021-07-12 DIAGNOSIS — Z5189 Encounter for other specified aftercare: Secondary | ICD-10-CM | POA: Diagnosis not present

## 2021-07-12 LAB — CBC WITH DIFFERENTIAL (CANCER CENTER ONLY)
Abs Immature Granulocytes: 0.06 10*3/uL (ref 0.00–0.07)
Basophils Absolute: 0 10*3/uL (ref 0.0–0.1)
Basophils Relative: 1 %
Eosinophils Absolute: 0 10*3/uL (ref 0.0–0.5)
Eosinophils Relative: 1 %
HCT: 27.9 % — ABNORMAL LOW (ref 39.0–52.0)
Hemoglobin: 9.4 g/dL — ABNORMAL LOW (ref 13.0–17.0)
Immature Granulocytes: 2 %
Lymphocytes Relative: 8 %
Lymphs Abs: 0.3 10*3/uL — ABNORMAL LOW (ref 0.7–4.0)
MCH: 33.3 pg (ref 26.0–34.0)
MCHC: 33.7 g/dL (ref 30.0–36.0)
MCV: 98.9 fL (ref 80.0–100.0)
Monocytes Absolute: 0.2 10*3/uL (ref 0.1–1.0)
Monocytes Relative: 5 %
Neutro Abs: 2.8 10*3/uL (ref 1.7–7.7)
Neutrophils Relative %: 83 %
Platelet Count: 135 10*3/uL — ABNORMAL LOW (ref 150–400)
RBC: 2.82 MIL/uL — ABNORMAL LOW (ref 4.22–5.81)
RDW: 16.6 % — ABNORMAL HIGH (ref 11.5–15.5)
WBC Count: 3.4 10*3/uL — ABNORMAL LOW (ref 4.0–10.5)
nRBC: 0 % (ref 0.0–0.2)

## 2021-07-12 LAB — CMP (CANCER CENTER ONLY)
ALT: 15 U/L (ref 0–44)
AST: 14 U/L — ABNORMAL LOW (ref 15–41)
Albumin: 3.9 g/dL (ref 3.5–5.0)
Alkaline Phosphatase: 86 U/L (ref 38–126)
Anion gap: 5 (ref 5–15)
BUN: 24 mg/dL — ABNORMAL HIGH (ref 8–23)
CO2: 31 mmol/L (ref 22–32)
Calcium: 8.7 mg/dL — ABNORMAL LOW (ref 8.9–10.3)
Chloride: 104 mmol/L (ref 98–111)
Creatinine: 0.66 mg/dL (ref 0.61–1.24)
GFR, Estimated: >60 mL/min (ref >60)
Glucose, Bld: 110 mg/dL — ABNORMAL HIGH (ref 70–99)
Potassium: 3.9 mmol/L (ref 3.5–5.1)
Sodium: 140 mmol/L (ref 135–145)
Total Bilirubin: 0.8 mg/dL (ref 0.3–1.2)
Total Protein: 6.5 g/dL (ref 6.5–8.1)

## 2021-07-21 ENCOUNTER — Encounter: Payer: Self-pay | Admitting: *Deleted

## 2021-07-21 NOTE — Progress Notes (Signed)
I followed up on Kelly Olson treatment plan. He is on observation and is scheduled for a follow up with med onc.

## 2021-07-27 ENCOUNTER — Ambulatory Visit: Payer: Medicare Other | Admitting: Physician Assistant

## 2021-07-27 NOTE — Progress Notes (Signed)
Cardiology Clinic Note   Patient Name: Kelly Olson. Date of Encounter: 07/29/2021  Primary Care Provider:  [name], MD Primary Cardiologist:  Glenetta Hew, MD  Patient Profile    71 year old male patient with known history of class III angina which is chronic for him coronary artery disease status post coronary artery bypass grafting (LIMA to OM, RIMA to LAD, SVG to diagonal, SVG to right PDA) with nonischemic nuclear medicine stress test and 2020 hypertension, prior history of tobacco abuse, ischemic cardiomyopathy, with preserved EF at 2 to 55% per recent echo, hyperlipidemia, history of right lower lobectomy via right thoracotomy in the setting of small cell lung cancer (stage T2a, N0) 07/29/2018.    Last seen by Dr. Ellyn Hack on 07/26/2020 and was stable from a cardiovascular standpoint and was asymptomatic.  The patient was very active playing golf 2 to 3 days a week 18 holes without complaints of chest discomfort or shortness of breath.  Past Medical History    Past Medical History:  Diagnosis Date   Coronary artery disease, occlusive 06/02/2014   Multivessel CAD.mLAD-100%, mRCA 99%, dRCA 100%, mLCX 90%  And EF 35-45%. -->  Referred for CABG; nonischemic Myoview March 2020   Former heavy tobacco smoker    Quit in April 2016    GERD (gastroesophageal reflux disease)    Hyperlipidemia with target LDL less than 70    Hypertension    Ischemic cardiomyopathy - resolved 05/2014   Myoview: EF ~33% with "infarction vs. severe resting ischemia in LAD & RCA territory; b) EF by Cath: 35-45%. 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   Stage T2 a, N0   Past Surgical History:  Procedure Laterality Date    CARDIAC CATHETERIZATION N/A 06/02/2014   Procedure: Left Heart Cath and Coronary Angiography;  Surgeon: Leonie Man, MD;  Location: [facility] INVASIVE CV [facility]UP[facility];  Service: Cardiovascular;  mLAD-100%, mRCA 99%, dRCA 100%, mLCX 90%  And EF 35-45%.   CORONARY ARTERY BYPASS GRAFT N/A 06/04/2014   Procedure: CORONARY ARTERY BYPASS GRAFTING (CABG) times four using bilateral internal mammary arteries and EVH for left  leg saphenous vein;  Surgeon: Gaye Pollack, MD;  Location: [facility] OR;  Service: Open Heart Surgery;  LIMA-OM, RIMA-LAD, SVG-Diag, SVG-rPDA   IR IMAGING GUIDED PORT INSERTION  05/16/2021   NM MYOVIEW LTD  05/28/2014   Pre-CABG:  High Risk Nuclear Stress Test with multivessel distribution ischemia.  -- Severely ischemic Cardiomyoapthy with evidence of at least 2 vessel disease & EF of ~33%.  Images are consistent with either infarction or severe resting ischemia in the LAD & RCA territory.   NM MYOVIEW LTD  03/2018   EF 50-55%.  Small basal inferior fixed defect consistent with diaphragmatic attenuation.  No ischemia or infarction.  Septal dyskinesis due to post CABG state   TEE WITHOUT CARDIOVERSION N/A 06/04/2014   Procedure: TRANSESOPHAGEAL ECHOCARDIOGRAM (TEE);  Surgeon: Gaye Pollack, MD;  Location: [facility];  Service: Open Heart Surgery;  Laterality: N/A;   THORACOTOMY Right 07/10/2016   Procedure: THORACOTOMY;  Surgeon: Gaye Pollack, MD;  Location: [facility] OR;  Service: Thoracic;  Laterality: Right;   THORACOTOMY/LOBECTOMY Right 07/29/2018  Procedure: THORACOTOMY/RIGHT LOWER LOBECTOMY;  Surgeon: Gaye Pollack, MD;  Location: [facility] OR;  Service: Thoracic;  Laterality: Right;   [place] ECHOCARDIOGRAM  10/2014   Mild concentric LVH. EF 50-55% with mild HK of basal anteroseptal myocardium. GR 1 DD.   VIDEO ASSISTED THORACOSCOPY (VATS)/WEDGE RESECTION Right 07/10/2016   Procedure: VIDEO ASSISTED THORACOSCOPY (VATS)/WEDGE RESECTION;  Surgeon: Gaye Pollack, MD;  Location: [facility]  OR;  Service: Thoracic;  Laterality: Right;   VIDEO BRONCHOSCOPY WITH ENDOBRONCHIAL NAVIGATION N/A 04/13/2021   Procedure: VIDEO BRONCHOSCOPY WITH ENDOBRONCHIAL NAVIGATION;  Surgeon: Melrose Nakayama, MD;  Location: [facility] OR;  Service: Thoracic;  Laterality: N/A;    Allergies  No Known Allergies  History of Present Illness    [name] is a very pleasant 71 year old male comes today for ongoing assessment and management of coronary artery disease, status post four-vessel CABG, 2016 ischemic cardiomyopathy, hypertension, hyperlipidemia, with history of small cell lung cancer with right thoracotomy in 2020.  He has recently been diagnosed with left lower lobe small cell carcinoma in March 2023, has undergone radiation, chemo, a second radiation, with plans for a CT scan of the brain for further evaluation and surveillance for METS.  He he comes today with a very good attitude.  He denies any angina symptoms, he does have some mild shortness of breath related to his lobectomies, he denies any dizziness, diaphoresis, syncope or near syncope.  Energy level remains stable although he is not extremely active now.  He also comes today with his wife who has been diagnosed with breast cancer in 2017 and continues surveillance for this.   Home Medications    Current Outpatient Medications  Medication Sig Dispense Refill   acetaminophen (TYLENOL) 325 MG tablet Take 650 mg by mouth every 6 (six) hours as needed for moderate pain.     aspirin 81 MG tablet Take 81 mg by mouth daily.     atorvastatin (LIPITOR) 40 MG tablet TAKE 1 TABLET AT BEDTIME 90 tablet 1   carvedilol (COREG) 3.125 MG tablet TAKE 2 TABLETS TWICE A DAY WITH     MEALS 360 tablet 1   cimetidine (TAGAMET) 200 MG tablet      losartan (COZAAR) 25 MG tablet TAKE 1 TABLET DAILY 90 tablet 1   OVER THE COUNTER MEDICATION [employer] sleep aid     Pseudoephedrine-APAP 30-325 MG TABS      sildenafil (VIAGRA) 50 MG tablet Take 1 tablet (50 mg total) by  mouth daily as needed for erectile dysfunction. 10 tablet 5   sucralfate (CARAFATE) 1 g tablet Take 1 tablet (1 g total) by mouth 4 (four) times daily -  with meals and at bedtime. 5 min before meals for radiation induced esophagitis 120 tablet 2   lidocaine-prilocaine (EMLA) cream Apply to the Port-A-Cath site 30 minutes before chemotherapy (Patient not taking: Reported on 06/14/2021) 30 g 0   prochlorperazine (COMPAZINE) 10 MG tablet Take 1 tablet (10 mg total) by mouth every 6 (six) hours as needed for nausea or vomiting. (Patient not taking: Reported on 06/14/2021) 30 tablet 0   No current facility-administered medications for this visit.     Family History    Family History  Problem Relation Age of Onset   Emphysema Mother    Cardiomyopathy Father    Healthy Sister    Heart attack Maternal Grandfather    Healthy Sister    Heart disease Paternal Uncle    He indicated that his mother  is deceased. He indicated that his father is deceased. He indicated that both of his sisters are alive. He indicated that his maternal grandfather is deceased. He indicated that the status of his paternal uncle is unknown.  Social History    Social History   Socioeconomic History   Marital status: Married    Spouse name: Not on file   Number of children: Not on file   Years of education: Not on file   Highest education level: Not on file  Occupational History   Not on file  Tobacco Use   Smoking status: Former    Packs/day: 0.80    Years: 46.00    Total pack years: 36.80    Types: Cigarettes    Quit date: 05/29/2014    Years since quitting: 7.1   Smokeless tobacco: Never  Vaping Use   Vaping Use: Never used  Substance and Sexual Activity   Alcohol use: Yes    Alcohol/week: 7.0 standard drinks of alcohol    Types: 7 Glasses of wine per week   Drug use: No   Sexual activity: Yes  Other Topics Concern   Not on file  Social History Narrative   Wife recently diagnosed with early stage  breast cancer   Social Determinants of Health   Financial Resource Strain: Not on file  Food Insecurity: Not on file  Transportation Needs: Not on file  Physical Activity: Not on file  Stress: Not on file  Social Connections: Not on file  Intimate Partner Violence: Not on file     Review of Systems    General:  No chills, fever, night sweats or weight changes.  Cardiovascular:  No chest pain,mild  dyspnea on exertion, edema, orthopnea, palpitations, paroxysmal nocturnal dyspnea. Dermatological: No rash, lesions/masses Respiratory: No cough, dyspnea Urologic: No hematuria, dysuria Abdominal:   No nausea, vomiting, diarrhea, bright red blood per rectum, melena, or hematemesis Neurologic:  No visual changes, wkns, changes in mental status. All other systems reviewed and are otherwise negative except as noted above.     Physical Exam    VS:  BP 120/80 (BP Location: Left Arm)   Pulse 80   Ht 5\' 11"  (1.803 m)   Wt 164 lb 12.8 oz (74.8 kg)   SpO2 100%   BMI 22.98 kg/m  , BMI Body mass index is 22.98 kg/m.     GEN: Well nourished, well developed, in no acute distress. HEENT: normal. Neck: Supple, no JVD, carotid bruits, or masses. Cardiac: RRR, no murmurs, rubs, or gallops. No clubbing, cyanosis, edema.  Radials/DP/PT 2+ and equal bilaterally.  Respiratory:  Respirations regular and unlabored, clear to auscultation bilaterally. GI: Soft, nontender, nondistended, BS + x 4. MS: no deformity or atrophy.  Right subclavian Port-A-Cath, well-healed sternotomy incision. Skin: warm and dry, no rash. Neuro:  Strength and sensation are intact. Psych: Normal affect.  Accessory Clinical Findings    ECG personally reviewed by me today-normal sinus rhythm with incomplete right bundle branch block and nonspecific ST-T wave abnormality noted anterior septal, heart rate of 80 bpm no acute changes  Lab Results  Component Value Date   WBC 3.4 (L) 07/12/2021   HGB 9.4 (L) 07/12/2021   HCT  27.9 (L) 07/12/2021   MCV 98.9 07/12/2021   PLT 135 (L) 07/12/2021   Lab Results  Component Value Date   CREATININE 0.66 07/12/2021   BUN 24 (H) 07/12/2021   NA 140 07/12/2021   K 3.9 07/12/2021   CL 104 07/12/2021  CO2 31 07/12/2021   Lab Results  Component Value Date   ALT 15 07/12/2021   AST 14 (L) 07/12/2021   ALKPHOS 86 07/12/2021   BILITOT 0.8 07/12/2021   Lab Results  Component Value Date   CHOL 140 04/08/2018   HDL 62 04/08/2018   LDLCALC 65 04/08/2018   TRIG 64 04/08/2018   CHOLHDL 2.3 04/08/2018    Lab Results  Component Value Date   HGBA1C 6.0 (H) 06/03/2014    Review of Prior Studies: NM Stress Test 04/11/2018 The left ventricular ejection fraction is mildly decreased (45-54%). Nuclear stress EF: 52%. Blood pressure demonstrated a normal response to exercise. There was no ST segment deviation noted during stress. No T wave inversion was noted during stress. Defect 1: There is a small defect of mild severity present in the basal inferior location. This is a low risk study.   Echocardiogram: 10/29/2014 Left ventricle: The cavity size was normal. There was mild    concentric hypertrophy. Systolic function was normal. The    estimated ejection fraction was in the range of 50% to 55%. Mild    hypokinesis of the basal anteroseptal myocardium. Doppler    parameters are consistent with abnormal left ventricular    relaxation (grade 1 diastolic dysfunction).  - Mitral valve: There was trivial regurgitation.  - Right ventricle: The cavity size was normal. Wall thickness was    normal. Systolic function was mildly reduced.   Assessment & Plan   1.  Coronary artery disease: History of four-vessel CABG: He is doing well and denies any angina symptoms.  He continues to have some mild shortness of breath related to ongoing treatment for lung cancer lobectomies, and radiation.  He is not as active as he has been but remains as active as he can be.  Still goes out  and walks and plays a little golf.  I will continue him on his current medication, no changes are planned.  2.  Hypertension: Well-controlled on carvedilol, and losartan.  Recent labs are completed by oncology.   3.  Newly diagnosed left lower lobe lung cancer with small cell carcinoma: Currently undergoing therapy, completed chemo, radiation, and is undergoing a second round of chemo.  He is also to have a CT scan of his brain to evaluate for METS.  He has not had a echocardiogram since 2016.  Since he has undergone so much radiation and chemo I am going to even evaluate him again for changes in LV systolic function.  I had like him to follow-up with Dr. Ellyn Hack in 6 months for ongoing management.  Prior history of right middle and lower lobe small cell carcinoma of the lung.  Followed by oncology Dr. Earlie Server.  4.  Hypercholesterolemia: Remains on atorvastatin 40 mg daily.  Labs are followed by PCP.  Patient reports very good control.  We will look for records for confirmation.  No changes in regimen.  Current medicines are reviewed at length with the patient today.  I have spent 35 min's  dedicated to the care of this patient on the date of this encounter to include pre-visit review of records, assessment, management and diagnostic testing,with shared decision making. Signed, Phill Myron. West Pugh, ANP, AACC   07/29/2021 9:01 AM    [facility] [address] Office [phone] Fax [phone]  Notice: This dictation was prepared with Dragon dictation along with smaller phrase technology. Any transcriptional errors that result from this process are unintentional and  may not be corrected upon review.

## 2021-07-28 DIAGNOSIS — E7800 Pure hypercholesterolemia, unspecified: Secondary | ICD-10-CM | POA: Diagnosis not present

## 2021-07-28 DIAGNOSIS — C3490 Malignant neoplasm of unspecified part of unspecified bronchus or lung: Secondary | ICD-10-CM | POA: Diagnosis not present

## 2021-07-28 DIAGNOSIS — I25810 Atherosclerosis of coronary artery bypass graft(s) without angina pectoris: Secondary | ICD-10-CM | POA: Diagnosis not present

## 2021-07-28 DIAGNOSIS — I700 Atherosclerosis of aorta: Secondary | ICD-10-CM | POA: Diagnosis not present

## 2021-07-28 DIAGNOSIS — Z0000 Encounter for general adult medical examination without abnormal findings: Secondary | ICD-10-CM | POA: Diagnosis not present

## 2021-07-28 DIAGNOSIS — R7301 Impaired fasting glucose: Secondary | ICD-10-CM | POA: Diagnosis not present

## 2021-07-28 DIAGNOSIS — Z1331 Encounter for screening for depression: Secondary | ICD-10-CM | POA: Diagnosis not present

## 2021-07-29 ENCOUNTER — Ambulatory Visit (INDEPENDENT_AMBULATORY_CARE_PROVIDER_SITE_OTHER): Payer: Medicare Other | Admitting: Adult Health

## 2021-07-29 ENCOUNTER — Encounter: Payer: Self-pay | Admitting: Adult Health

## 2021-07-29 VITALS — BP 120/80 | HR 80 | Ht 71.0 in | Wt 164.8 lb

## 2021-07-29 DIAGNOSIS — Z951 Presence of aortocoronary bypass graft: Secondary | ICD-10-CM | POA: Diagnosis not present

## 2021-07-29 DIAGNOSIS — C3492 Malignant neoplasm of unspecified part of left bronchus or lung: Secondary | ICD-10-CM | POA: Diagnosis not present

## 2021-07-29 DIAGNOSIS — Z87891 Personal history of nicotine dependence: Secondary | ICD-10-CM

## 2021-07-29 DIAGNOSIS — I2510 Atherosclerotic heart disease of native coronary artery without angina pectoris: Secondary | ICD-10-CM

## 2021-07-29 DIAGNOSIS — I10 Essential (primary) hypertension: Secondary | ICD-10-CM

## 2021-07-29 DIAGNOSIS — E7800 Pure hypercholesterolemia, unspecified: Secondary | ICD-10-CM

## 2021-07-29 NOTE — Patient Instructions (Signed)
Medication Instructions:  No Changes *If you need a refill on your cardiac medications before your next appointment, please call your pharmacy*   Lab Work: No Labs If you have labs (blood work) drawn today and your tests are completely normal, you will receive your results only by: [place] (if you have MyChart) OR A paper copy in the mail If you have any lab test that is abnormal or we need to change your treatment, we will call you to review the results.   Testing/Procedures: [address], [address] Your physician has requested that you have an echocardiogram. Echocardiography is a painless test that uses sound waves to create images of your heart. It provides your doctor with information about the size and shape of your heart and how well your heart's chambers and valves are working. This procedure takes approximately one hour. There are no restrictions for this procedure.    Follow-Up: At [facility], you and your health needs are our priority.  As part of our continuing mission to provide you with exceptional heart care, we have created designated Provider Care Teams.  These Care Teams include your primary Cardiologist (physician) and Advanced Practice Providers (APPs -  Physician Assistants and Nurse Practitioners) who all work together to provide you with the care you need, when you need it.  We recommend signing up for the patient portal called "MyChart".  Sign up information is provided on this After Visit Summary.  MyChart is used to connect with patients for Virtual Visits (Telemedicine).  Patients are able to view lab/test results, encounter notes, upcoming appointments, etc.  Non-urgent messages can be sent to your provider as well.   To learn more about what you can do with MyChart, go to [email or web address].    Your next appointment:   6 month(s)  The format for your next appointment:   In Person  Provider:   Glenetta Hew, MD        Important Information About Sugar

## 2021-08-01 ENCOUNTER — Encounter: Payer: Self-pay | Admitting: Urology

## 2021-08-01 NOTE — Progress Notes (Signed)
Telephone appointment. I verified patient's identity and began nursing interview. Patient reports sore throat 2/10, some SOB, and a cough (from recent sinus drainage). No other issues reported at this time.  Meaningful use complete.  Reminded patient of his 10:30am-08/03/21 telephone appointment w/ Ashlyn Bruning PA-C. I left my extension [phone] in case patient needs anything. Patient verbalized understanding.  Patient contact [phone]

## 2021-08-03 ENCOUNTER — Other Ambulatory Visit: Payer: Self-pay

## 2021-08-03 ENCOUNTER — Ambulatory Visit (HOSPITAL_COMMUNITY)
Admission: RE | Admit: 2021-08-03 | Discharge: 2021-08-03 | Disposition: A | Discharge status: Home / Self Care | Payer: Medicare Other | Source: Ambulatory Visit | Attending: Internal Medicine | Admitting: Internal Medicine

## 2021-08-03 ENCOUNTER — Ambulatory Visit
Admission: RE | Admit: 2021-08-03 | Discharge: 2021-08-03 | Disposition: A | Discharge status: Home / Self Care | Payer: Medicare Other | Source: Ambulatory Visit | Attending: Urology | Admitting: Urology

## 2021-08-03 ENCOUNTER — Inpatient Hospital Stay: Payer: Medicare Other | Attending: Internal Medicine

## 2021-08-03 DIAGNOSIS — C3490 Malignant neoplasm of unspecified part of unspecified bronchus or lung: Secondary | ICD-10-CM

## 2021-08-03 DIAGNOSIS — Z87891 Personal history of nicotine dependence: Secondary | ICD-10-CM | POA: Diagnosis not present

## 2021-08-03 DIAGNOSIS — C3492 Malignant neoplasm of unspecified part of left bronchus or lung: Secondary | ICD-10-CM

## 2021-08-03 DIAGNOSIS — Z902 Acquired absence of lung [part of]: Secondary | ICD-10-CM | POA: Insufficient documentation

## 2021-08-03 DIAGNOSIS — R918 Other nonspecific abnormal finding of lung field: Secondary | ICD-10-CM | POA: Diagnosis not present

## 2021-08-03 DIAGNOSIS — C3432 Malignant neoplasm of lower lobe, left bronchus or lung: Secondary | ICD-10-CM | POA: Insufficient documentation

## 2021-08-03 DIAGNOSIS — J439 Emphysema, unspecified: Secondary | ICD-10-CM | POA: Diagnosis not present

## 2021-08-03 LAB — CMP (CANCER CENTER ONLY)
ALT: 17 U/L (ref 0–44)
AST: 21 U/L (ref 15–41)
Albumin: 4.1 g/dL (ref 3.5–5.0)
Alkaline Phosphatase: 100 U/L (ref 38–126)
Anion gap: 6 (ref 5–15)
BUN: 23 mg/dL (ref 8–23)
CO2: 29 mmol/L (ref 22–32)
Calcium: 9.2 mg/dL (ref 8.9–10.3)
Chloride: 106 mmol/L (ref 98–111)
Creatinine: 0.92 mg/dL (ref 0.61–1.24)
GFR, Estimated: >60 mL/min (ref >60)
Glucose, Bld: 81 mg/dL (ref 70–99)
Potassium: 4.2 mmol/L (ref 3.5–5.1)
Sodium: 141 mmol/L (ref 135–145)
Total Bilirubin: 0.4 mg/dL (ref 0.3–1.2)
Total Protein: 7 g/dL (ref 6.5–8.1)

## 2021-08-03 LAB — CBC WITH DIFFERENTIAL (CANCER CENTER ONLY)
Abs Immature Granulocytes: 0.02 10*3/uL (ref 0.00–0.07)
Basophils Absolute: 0.1 10*3/uL (ref 0.0–0.1)
Basophils Relative: 2 %
Eosinophils Absolute: 0 10*3/uL (ref 0.0–0.5)
Eosinophils Relative: 0 %
HCT: 31.6 % — ABNORMAL LOW (ref 39.0–52.0)
Hemoglobin: 10.2 g/dL — ABNORMAL LOW (ref 13.0–17.0)
Immature Granulocytes: 0 %
Lymphocytes Relative: 18 %
Lymphs Abs: 1.2 10*3/uL (ref 0.7–4.0)
MCH: 34.9 pg — ABNORMAL HIGH (ref 26.0–34.0)
MCHC: 32.3 g/dL (ref 30.0–36.0)
MCV: 108.2 fL — ABNORMAL HIGH (ref 80.0–100.0)
Monocytes Absolute: 0.9 10*3/uL (ref 0.1–1.0)
Monocytes Relative: 15 %
Neutro Abs: 4.1 10*3/uL (ref 1.7–7.7)
Neutrophils Relative %: 65 %
Platelet Count: 304 10*3/uL (ref 150–400)
RBC: 2.92 MIL/uL — ABNORMAL LOW (ref 4.22–5.81)
RDW: 21.9 % — ABNORMAL HIGH (ref 11.5–15.5)
WBC Count: 6.4 10*3/uL (ref 4.0–10.5)
nRBC: 0 % (ref 0.0–0.2)

## 2021-08-03 MED ORDER — HEPARIN SOD (PORK) LOCK FLUSH 100 UNIT/ML IV SOLN
INTRAVENOUS | Status: AC
  Filled 2021-08-03: qty 5

## 2021-08-03 MED ORDER — SODIUM CHLORIDE (PF) 0.9 % IJ SOLN
INTRAMUSCULAR | Status: AC
  Filled 2021-08-03: qty 50

## 2021-08-03 MED ORDER — IOHEXOL 300 MG/ML  SOLN
75.0000 mL | Freq: Once | INTRAMUSCULAR | Status: AC | PRN
  Administered 2021-08-03: 75 mL via INTRAVENOUS

## 2021-08-03 MED ORDER — HEPARIN SOD (PORK) LOCK FLUSH 100 UNIT/ML IV SOLN
500.0000 [IU] | Freq: Once | INTRAVENOUS | Status: AC
  Administered 2021-08-03: 500 [IU] via INTRAVENOUS

## 2021-08-03 NOTE — Progress Notes (Signed)
  Radiation Oncology         [phone]) [phone] ________________________________  Name: Kelly Olson. MRN: [id number]  Date: 06/20/2021  DOB: 04-20-1950   End of Treatment Note  Diagnosis:   71 yo man with newly diagnosed limited stage (T3N0M0) small cell carcinoma in the LLL lung     Indication for treatment:  Curative, Chemo-Radiotherapy       Radiation treatment dates:   05/05/21 - 06/20/21  Site/dose:   The primary tumor and involved mediastinal adenopathy were treated to 66 Gy in 33 fractions of 2 Gy; concurrent with chemotherapy.  Beams/energy:   A five field 3D conformal treatment arrangement was used delivering 6 and 10 MV photons.  Daily image-guidance CT was used to align the treatment with the targeted volume  Narrative: The patient tolerated radiation treatment relatively well.  The patient experienced some esophagitis characterized as moderate but manageable with Carafate and soft foods diet.  The patient also noted fatigue.  Plan: The patient has completed radiation treatment. The patient will return to radiation oncology clinic for routine followup in one month. I advised him to call or return sooner if he has any questions or concerns related to his recovery or treatment.  ________________________________  Sheral Apley. Tammi Klippel, M.D.

## 2021-08-03 NOTE — Progress Notes (Signed)
Radiation Oncology         [phone]) [phone] ________________________________  Name: Kelly Olson. 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   Narrative:  I spoke with the patient to conduct his routine scheduled 1 month follow up visit via telephone to spare the patient unnecessary potential exposure in the healthcare setting during the current COVID-19 pandemic.  The patient was notified in advance and gave permission to proceed with this visit format.  He tolerated radiation treatment relatively well.  The patient experienced some esophagitis characterized as moderate but manageable with Carafate and soft foods diet.  The patient also noted fatigue.                              On review of systems, the patient states that he is doing well in general.  He continues with mild residual dysphagia but feels that this continues to gradually improve and is significantly improved over the last 3 weeks.  His energy level is also gradually improving and he has been able to remain active. He does continue with shortness of breath with exertion, which seems more pronounced following Udenyca injections. His most recent injection was approximately 3 weeks ago.  Otherwise, he is without complaints and is quite pleased with his progress to date.  ALLERGIES:  has No Known Allergies.  Meds: Current Outpatient Medications  Medication Sig Dispense Refill   acetaminophen (TYLENOL) 325 MG tablet Take 650 mg by mouth every 6 (six) hours as needed for moderate pain.     aspirin 81 MG tablet Take 81 mg by mouth daily.     atorvastatin  (LIPITOR) 40 MG tablet TAKE 1 TABLET AT BEDTIME 90 tablet 1   carvedilol (COREG) 3.125 MG tablet TAKE 2 TABLETS TWICE A DAY WITH     MEALS 360 tablet 1   cimetidine (TAGAMET) 200 MG tablet      lidocaine-prilocaine (EMLA) cream Apply to the Port-A-Cath site 30 minutes before chemotherapy (Patient not taking: Reported on 06/14/2021) 30 g 0   losartan (COZAAR) 25 MG tablet TAKE 1 TABLET DAILY 90 tablet 1   OVER THE COUNTER MEDICATION [employer] sleep aid     prochlorperazine (COMPAZINE) 10 MG tablet Take 1 tablet (10 mg total) by mouth every 6 (six) hours as needed for nausea or vomiting. (Patient not taking: Reported on 06/14/2021) 30 tablet 0   Pseudoephedrine-APAP 30-325 MG TABS      sildenafil (VIAGRA) 50 MG tablet Take 1 tablet (50 mg total) by mouth daily as needed for erectile dysfunction. 10 tablet 5   sucralfate (CARAFATE) 1 g tablet Take 1 tablet (1 g total) by mouth 4 (four) times daily -  with meals and at bedtime. 5 min before meals for radiation induced esophagitis 120 tablet 2   No current facility-administered medications for this encounter.    Physical Findings:  vitals were not taken for this visit.  Pain Assessment Pain Score: 2  (Sore throat)/10 Unable to assess due to telephone follow-up visit format.  Lab Findings:  Lab Results  Component Value Date   WBC 6.4 08/03/2021   HGB 10.2 (L) 08/03/2021   HCT 31.6 (L) 08/03/2021   MCV 108.2 (H) 08/03/2021   PLT 304 08/03/2021     Radiographic Findings: No results found.  Impression/Plan: 12. 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 He appears to be recovering well from the effects of his recent chemoradiation.  He is scheduled for a restaging CT chest scan this afternoon and will follow-up with Dr. Earlie Server on 08/08/2021 to review the results and further recommendations.  Pending this scan shows an excellent response to treatment and there is no further need for chemotherapy, he  is interested in proceeding with prophylactic cranial irradiation (PCI) which we discussed at the time of his initial consult visit and will discuss further prior to starting treatment.  Once he is cleared by medical oncology, we will arrange for a follow-up visit with CT simulation the same day.  We will continue to follow his progress and look forward to continuing to participate in his care.    Nicholos Johns, PA-C

## 2021-08-08 ENCOUNTER — Other Ambulatory Visit: Payer: Self-pay

## 2021-08-08 ENCOUNTER — Inpatient Hospital Stay (HOSPITAL_BASED_OUTPATIENT_CLINIC_OR_DEPARTMENT_OTHER): Payer: Medicare Other | Admitting: Internal Medicine

## 2021-08-08 VITALS — BP 141/78 | HR 90 | Temp 98.8°F | Resp 17 | Wt 166.2 lb

## 2021-08-08 DIAGNOSIS — Z87891 Personal history of nicotine dependence: Secondary | ICD-10-CM | POA: Diagnosis not present

## 2021-08-08 DIAGNOSIS — C3490 Malignant neoplasm of unspecified part of unspecified bronchus or lung: Secondary | ICD-10-CM

## 2021-08-08 DIAGNOSIS — C3432 Malignant neoplasm of lower lobe, left bronchus or lung: Secondary | ICD-10-CM | POA: Diagnosis not present

## 2021-08-08 DIAGNOSIS — Z902 Acquired absence of lung [part of]: Secondary | ICD-10-CM | POA: Diagnosis not present

## 2021-08-08 NOTE — Progress Notes (Signed)
[place] [phone]   [phone]  OFFICE PROGRESS NOTE  [name], MD [address] [place] [address]  DIAGNOSIS: Unresectable limited stage IIb (T3, N0, M0) small cell lung cancer presented with 2 pulmonary nodules in the left lower lobe with no evidence of metastatic disease to the lymph nodes or distant metastasis diagnosed in March 2023  PRIOR THERAPY: Systemic chemotherapy with carboplatin for AUC of 5 and etoposide 100 Mg/M2 on days 1, 2 and 3 every 3 weeks.  First dose started 05/02/2021.  Status post 4 cycles.  This is concurrent with radiotherapy under the care of Dr. Tammi Klippel.  CURRENT THERAPY: Observation  INTERVAL HISTORY: Kelly Olson. 71 y.o. male returns to the clinic today for follow-up visit accompanied by his wife.  The patient is feeling fine today with no concerning complaints except for mild fatigue and shortness of breath with exertion.  He denied having any current chest pain, shortness of breath except with exertion with no cough or hemoptysis.  He has no nausea, vomiting, diarrhea or constipation.  He denied having any headache or visual changes.  He has no recent weight loss or night sweats.  He tolerated the previous course of chemotherapy concurrent with radiation fairly well.  The patient had repeat CT scan of the chest performed recently and he is here for evaluation and discussion of his discuss results.  MEDICAL HISTORY: Past Medical History:  Diagnosis Date   Coronary artery disease, occlusive 06/02/2014   Multivessel CAD.mLAD-100%, mRCA 99%, dRCA 100%, mLCX 90%  And EF 35-45%. -->  Referred for CABG; nonischemic Myoview March 2020   Former heavy tobacco smoker    Quit in April 2016    GERD (gastroesophageal reflux disease)    Hyperlipidemia with target LDL less than 70    Hypertension    Ischemic cardiomyopathy - resolved 05/2014   Myoview: EF ~33% with "infarction vs. severe  resting ischemia in LAD & RCA territory; b) EF by Cath: 35-45%. 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   Stage T2 a, N0    ALLERGIES:  has No Known Allergies.  MEDICATIONS:  Current Outpatient Medications  Medication Sig Dispense Refill   acetaminophen (TYLENOL) 325 MG tablet Take 650 mg by mouth every 6 (six) hours as needed for moderate pain.     aspirin 81 MG tablet Take 81 mg by mouth daily.     atorvastatin (LIPITOR) 40 MG tablet TAKE 1 TABLET AT BEDTIME 90 tablet 1   carvedilol (COREG) 3.125 MG tablet TAKE 2 TABLETS TWICE A DAY WITH     MEALS 360 tablet 1   cimetidine (TAGAMET) 200 MG tablet      lidocaine-prilocaine (EMLA) cream Apply to the Port-A-Cath site 30 minutes before chemotherapy (Patient not taking: Reported on 06/14/2021) 30 g 0   losartan (COZAAR) 25 MG tablet TAKE 1 TABLET DAILY 90 tablet 1   OVER THE COUNTER MEDICATION [employer] sleep aid     prochlorperazine (COMPAZINE) 10 MG tablet Take 1 tablet (10 mg total) by mouth every 6 (six) hours as needed for nausea or vomiting. (Patient not taking: Reported on 06/14/2021)  30 tablet 0   Pseudoephedrine-APAP 30-325 MG TABS      sildenafil (VIAGRA) 50 MG tablet Take 1 tablet (50 mg total) by mouth daily as needed for erectile dysfunction. 10 tablet 5   sucralfate (CARAFATE) 1 g tablet Take 1 tablet (1 g total) by mouth 4 (four) times daily -  with meals and at bedtime. 5 min before meals for radiation induced esophagitis 120 tablet 2   No current facility-administered medications for this visit.    SURGICAL HISTORY:  Past Surgical History:  Procedure Laterality Date   CARDIAC CATHETERIZATION N/A 06/02/2014   Procedure: Left Heart Cath and Coronary Angiography;   Surgeon: Leonie Man, MD;  Location: [facility] INVASIVE CV LAB CUPID;  Service: Cardiovascular;  mLAD-100%, mRCA 99%, dRCA 100%, mLCX 90%  And EF 35-45%.   CORONARY ARTERY BYPASS GRAFT N/A 06/04/2014   Procedure: CORONARY ARTERY BYPASS GRAFTING (CABG) times four using bilateral internal mammary arteries and EVH for left  leg saphenous vein;  Surgeon: Gaye Pollack, MD;  Location: [facility];  Service: Open Heart Surgery;  LIMA-OM, RIMA-LAD, SVG-Diag, SVG-rPDA   IR IMAGING GUIDED PORT INSERTION  05/16/2021   NM MYOVIEW LTD  05/28/2014   Pre-CABG:  High Risk Nuclear Stress Test with multivessel distribution ischemia.  -- Severely ischemic Cardiomyoapthy with evidence of at least 2 vessel disease & EF of ~33%.  Images are consistent with either infarction or severe resting ischemia in the LAD & RCA territory.   NM MYOVIEW LTD  03/2018   EF 50-55%.  Small basal inferior fixed defect consistent with diaphragmatic attenuation.  No ischemia or infarction.  Septal dyskinesis due to post CABG state   TEE WITHOUT CARDIOVERSION N/A 06/04/2014   Procedure: TRANSESOPHAGEAL ECHOCARDIOGRAM (TEE);  Surgeon: Gaye Pollack, MD;  Location: [facility];  Service: Open Heart Surgery;  Laterality: N/A;   THORACOTOMY Right 07/10/2016   Procedure: THORACOTOMY;  Surgeon: Gaye Pollack, MD;  Location: [facility] OR;  Service: Thoracic;  Laterality: Right;   THORACOTOMY/LOBECTOMY Right 07/29/2018   Procedure: THORACOTOMY/RIGHT LOWER LOBECTOMY;  Surgeon: Gaye Pollack, MD;  Location: [facility] OR;  Service: Thoracic;  Laterality: Right;   [facility] ECHOCARDIOGRAM  10/2014   Mild concentric LVH. EF 50-55% with mild HK of basal anteroseptal myocardium. GR 1 DD.   VIDEO ASSISTED THORACOSCOPY (VATS)/WEDGE RESECTION Right 07/10/2016   Procedure: VIDEO ASSISTED THORACOSCOPY (VATS)/WEDGE RESECTION;  Surgeon: Gaye Pollack, MD;  Location: [facility] OR;  Service: Thoracic;  Laterality: Right;   VIDEO BRONCHOSCOPY WITH ENDOBRONCHIAL NAVIGATION  N/A 04/13/2021   Procedure: VIDEO BRONCHOSCOPY WITH ENDOBRONCHIAL NAVIGATION;  Surgeon: Melrose Nakayama, MD;  Location: [facility];  Service: Thoracic;  Laterality: N/A;    REVIEW OF SYSTEMS:  Constitutional: positive for fatigue Eyes: negative Ears, nose, mouth, throat, and face: negative Respiratory: positive for dyspnea on exertion Cardiovascular: negative Gastrointestinal: negative Genitourinary:negative Integument/breast: negative Hematologic/lymphatic: negative Musculoskeletal:negative Neurological: negative Behavioral/Psych: negative Endocrine: negative Allergic/Immunologic: negative   PHYSICAL EXAMINATION: General appearance: alert, cooperative, fatigued, and no distress Head: Normocephalic, without obvious abnormality, atraumatic Neck: no adenopathy, no JVD, supple, symmetrical, trachea midline, and thyroid not enlarged, symmetric, no tenderness/mass/nodules Lymph nodes: Cervical, supraclavicular, and axillary nodes normal. Resp: clear to auscultation bilaterally Back: symmetric, no curvature. ROM normal. No CVA tenderness. Cardio: regular rate and rhythm, S1, S2 normal, no murmur, click, rub or gallop GI: soft, non-tender; bowel sounds normal; no masses,  no organomegaly Extremities: extremities normal, atraumatic, no cyanosis or edema Neurologic: Alert  and oriented X 3, normal strength and tone. Normal symmetric reflexes. Normal coordination and gait  ECOG PERFORMANCE STATUS: 1 - Symptomatic but completely ambulatory  Blood pressure (!) 141/78, pulse 90, temperature 98.8 F (37.1 C), temperature source Temporal, resp. rate 17, weight 166 lb 3.2 oz (75.4 kg), SpO2 92 %.  LABORATORY DATA: Lab Results  Component Value Date   WBC 6.4 08/03/2021   HGB 10.2 (L) 08/03/2021   HCT 31.6 (L) 08/03/2021   MCV 108.2 (H) 08/03/2021   PLT 304 08/03/2021      Chemistry      Component Value Date/Time   NA 141 08/03/2021 0930   NA 142 04/08/2018 0858   K 4.2 08/03/2021 0930    CL 106 08/03/2021 0930   CO2 29 08/03/2021 0930   BUN 23 08/03/2021 0930   BUN 24 04/08/2018 0858   CREATININE 0.92 08/03/2021 0930   CREATININE 0.85 01/03/2016 0840      Component Value Date/Time   CALCIUM 9.2 08/03/2021 0930   ALKPHOS 100 08/03/2021 0930   AST 21 08/03/2021 0930   ALT 17 08/03/2021 0930   BILITOT 0.4 08/03/2021 0930       RADIOGRAPHIC STUDIES: CT Chest W Contrast  Result Date: 08/03/2021 CLINICAL DATA:  Small-cell lung cancer, diagnosed in March. Chemotherapy and radiation therapy completed. Right lung cancer 5 years ago with wedge resection. Right lower lobectomy in 2020. * Tracking Code: BO * EXAM: CT CHEST WITH CONTRAST TECHNIQUE: Multidetector CT imaging of the chest was performed during intravenous contrast administration. RADIATION DOSE REDUCTION: This exam was performed according to the departmental dose-optimization program which includes automated exposure control, adjustment of the mA and/or kV according to patient size and/or use of iterative reconstruction technique. CONTRAST:  69mL OMNIPAQUE IOHEXOL 300 MG/ML  SOLN COMPARISON:  04/06/2021 PET.  Chest CT 03/23/2021. FINDINGS: Cardiovascular: Aortic atherosclerosis. Normal heart size, without pericardial effusion. Median sternotomy for CABG. No central pulmonary embolism, on this non-dedicated study. Ductus remnant along the transverse aorta on 61/2, similar. Mediastinum/Nodes: No supraclavicular adenopathy. No mediastinal or hilar adenopathy. Lungs/Pleura: Trace right pleural fluid, similar. Advanced centrilobular emphysema. Right lower lobectomy. The medial left lower lobe 1.7 cm nodule on the prior exam is decreased in size. Measures 8 mm on 112/5. The more cephalad left lower lobe pulmonary nodule has also decreased in size. 8 mm on 100/5 versus 1.3 x 1.3 cm on the prior exam. The satellite smaller nodule is not readily apparent. Other bilateral pulmonary nodules are similar and identified on series 5. Example  lateral left lower lobe at 7 mm on 124/5. Again identified is interstitial lung disease, with subpleural reticulation, traction bronchiolectasis. Upper Abdomen: Normal imaged portions of the liver, spleen, stomach, pancreas, gallbladder, adrenal glands, kidneys. Musculoskeletal: No acute osseous abnormality. IMPRESSION: 1. Response to therapy of previously hypermetabolic left lower lobe pulmonary nodules. 2. No new or progressive disease. 3. No thoracic adenopathy. 4. Other pulmonary nodules are similar, favored to be benign. 5. Aortic atherosclerosis (ICD10-I70.0) and emphysema (ICD10-J43.9). Electronically Signed   By: Abigail Miyamoto M.D.   On: 08/03/2021 14:36    ASSESSMENT AND PLAN: This is a very pleasant 71 years old white male diagnosed with unresectable limited stage (T3, N0, M0 small cell lung cancer presented with 2 nodules in the left lower lobe with no evidence of metastatic disease to the thoracic lymph nodes or distant metastases diagnosed in March 2023. The patient underwent a course of systemic chemotherapy with carboplatin for AUC of 5 on day  1 and etoposide 100 Mg/M2 on days 1, 2 and 3 status post 4 cycles.  This is concurrent with radiotherapy.   The patient tolerated this course of chemotherapy concurrent with radiation fairly well except for the fatigue and alopecia as well as shortness of breath with exertion. He had repeat CT scan of the chest performed recently.  I personally and independently reviewed the scan images and discussed the results with the patient and his wife. His scan showed response to the treatment with significant decrease and the left lower lobe pulmonary nodule. I recommended for the patient to continue on observation with repeat CT scan of the chest in 3 months. The patient will also discuss with Dr. Tammi Klippel consideration of prophylactic cranial irradiation after his good response to the course of systemic chemotherapy with radiation. He was advised to call  immediately if he has any other concerning symptoms in the interval. The patient voices understanding of current disease status and treatment options and is in agreement with the current care plan.  All questions were answered. The patient knows to call the clinic with any problems, questions or concerns. We can certainly see the patient much sooner if necessary.  The total time spent in the appointment was 30 minutes.  Disclaimer: This note was dictated with voice recognition software. Similar sounding words can inadvertently be transcribed and may not be corrected upon review.

## 2021-08-10 ENCOUNTER — Ambulatory Visit (HOSPITAL_COMMUNITY): Payer: Medicare Other | Attending: Internal Medicine

## 2021-08-10 DIAGNOSIS — I2510 Atherosclerotic heart disease of native coronary artery without angina pectoris: Secondary | ICD-10-CM | POA: Insufficient documentation

## 2021-08-10 HISTORY — PX: TRANSTHORACIC ECHOCARDIOGRAM: SHX275

## 2021-08-10 LAB — ECHOCARDIOGRAM COMPLETE
Area-P 1/2: 3.37 cm2
S' Lateral: 2.7 cm

## 2021-08-10 MED ORDER — PERFLUTREN LIPID MICROSPHERE
1.0000 mL | INTRAVENOUS | Status: AC | PRN
  Administered 2021-08-10: 2 mL via INTRAVENOUS

## 2021-08-12 ENCOUNTER — Telehealth: Payer: Self-pay

## 2021-08-12 NOTE — Telephone Encounter (Addendum)
Called patient regarding results. Patient had understanding of results.----- Message from Lendon Colonel, NP sent at 08/11/2021  2:37 PM EDT ----- I have reviewed his echocardiogram report. His heart pumping function is normal with no issues with the heart valves. Some stiffening of heart muscle with the relaxation but not severe.Doristine Devoid report.   KL

## 2021-08-17 ENCOUNTER — Telehealth: Payer: Self-pay | Admitting: *Deleted

## 2021-08-17 ENCOUNTER — Encounter: Payer: Self-pay | Admitting: Urology

## 2021-08-17 ENCOUNTER — Other Ambulatory Visit: Payer: Self-pay | Admitting: Urology

## 2021-08-17 DIAGNOSIS — C3490 Malignant neoplasm of unspecified part of unspecified bronchus or lung: Secondary | ICD-10-CM

## 2021-08-17 NOTE — Telephone Encounter (Signed)
CALLED PATIENT TO INFORM OF MRI FOR 08-18-21- ARRIVAL TIME- 7:30 PM @ [facility], NO RESTRICTIONS TO TEST,SPOKE WITH PATIENT AND HE IS AWARE OF THIS TEST

## 2021-08-17 NOTE — Progress Notes (Signed)
Telephone-Reconsult appointment. I verified patient's identity and began nursing interview. Patient reports some mild SOB, otherwise patient is doing fine. No other issues reports at this time.  Meaningful use complete.  Reminded patient of his 9:00am-10/19/21 telephone appointment w/ Ashlyn Bruning PA-C. I left my extension [phone] in case patient needs anything. Patient verbalized understanding.  Patient contact [phone]

## 2021-08-18 ENCOUNTER — Encounter (HOSPITAL_COMMUNITY): Payer: Self-pay

## 2021-08-18 ENCOUNTER — Ambulatory Visit: Payer: Medicare Other

## 2021-08-18 ENCOUNTER — Ambulatory Visit
Admission: RE | Admit: 2021-08-18 | Discharge: 2021-08-18 | Disposition: A | Discharge status: Home / Self Care | Payer: Medicare Other | Source: Ambulatory Visit | Attending: Urology | Admitting: Urology

## 2021-08-18 ENCOUNTER — Other Ambulatory Visit (HOSPITAL_COMMUNITY): Payer: Medicare Other

## 2021-08-18 ENCOUNTER — Ambulatory Visit (HOSPITAL_COMMUNITY)
Admission: RE | Admit: 2021-08-18 | Discharge: 2021-08-18 | Disposition: A | Discharge status: Home / Self Care | Payer: Medicare Other | Source: Ambulatory Visit | Attending: Urology | Admitting: Urology

## 2021-08-18 DIAGNOSIS — C3490 Malignant neoplasm of unspecified part of unspecified bronchus or lung: Secondary | ICD-10-CM | POA: Diagnosis not present

## 2021-08-18 DIAGNOSIS — Z87891 Personal history of nicotine dependence: Secondary | ICD-10-CM | POA: Diagnosis not present

## 2021-08-18 DIAGNOSIS — Z298 Encounter for other specified prophylactic measures: Secondary | ICD-10-CM | POA: Diagnosis not present

## 2021-08-18 DIAGNOSIS — I6782 Cerebral ischemia: Secondary | ICD-10-CM | POA: Diagnosis not present

## 2021-08-18 DIAGNOSIS — C3432 Malignant neoplasm of lower lobe, left bronchus or lung: Secondary | ICD-10-CM | POA: Diagnosis not present

## 2021-08-18 DIAGNOSIS — C3492 Malignant neoplasm of unspecified part of left bronchus or lung: Secondary | ICD-10-CM

## 2021-08-18 MED ORDER — GADOBUTROL 1 MMOL/ML IV SOLN
7.5000 mL | Freq: Once | INTRAVENOUS | Status: AC | PRN
  Administered 2021-08-18: 7.5 mL via INTRAVENOUS

## 2021-08-18 NOTE — Progress Notes (Signed)
Radiation Oncology         [phone]) [phone] ________________________________  Name: Kelly Olson. 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  Narrative:  I spoke with the patient to conduct his routine scheduled 1 month follow up visit via telephone to spare the patient unnecessary potential exposure in the healthcare setting during the current COVID-19 pandemic.  The patient was notified in advance and gave permission to proceed with this visit format.  He had a recent post-treatment CT Chest on 08/03/21 which shows an excellent response to treatment with significantly decreased size of the recently treated LLL lung nodule, no thoracic adenopathy and no new or progressive disease.                             On review of systems, the patient states that he is doing well in general.  He reports that the dysphagia continues to gradually improve and is almost completely resolved at this point.  His energy level is also gradually improving and he has been able to remain active. He does continue with shortness of breath with exertion, unchanged recently. Otherwise, he is without complaints and is quite pleased with his progress to date. He specifically denies productive cough or hemoptysis, nausea, vomiting, diarrhea or constipation.  He denied having any headache or visual changes and denies recent weight loss or night sweats.   ALLERGIES:  has No Known Allergies.  Meds: Current Outpatient Medications  Medication Sig Dispense Refill   acetaminophen (TYLENOL) 325 MG tablet Take 650 mg by mouth every 6 (six) hours as needed  for moderate pain.     aspirin 81 MG tablet Take 81 mg by mouth daily.     atorvastatin (LIPITOR) 40 MG tablet TAKE 1 TABLET AT BEDTIME 90 tablet 1   carvedilol (COREG) 3.125 MG tablet TAKE 2 TABLETS TWICE A DAY WITH     MEALS 360 tablet 1   cimetidine (TAGAMET) 200 MG tablet      lidocaine-prilocaine (EMLA) cream Apply to the Port-A-Cath site 30 minutes before chemotherapy (Patient not taking: Reported on 06/14/2021) 30 g 0   losartan (COZAAR) 25 MG tablet TAKE 1 TABLET DAILY 90 tablet 1   OVER THE COUNTER MEDICATION [employer] sleep aid     prochlorperazine (COMPAZINE) 10 MG tablet Take 1 tablet (10 mg total) by mouth every 6 (six) hours as needed for nausea or vomiting. (Patient not taking: Reported on 06/14/2021) 30 tablet 0   Pseudoephedrine-APAP 30-325 MG TABS      sildenafil (VIAGRA) 50 MG tablet Take 1 tablet (50 mg total) by mouth daily as needed for erectile dysfunction. 10 tablet 5   sucralfate (CARAFATE) 1 g tablet Take 1 tablet (1 g total) by mouth 4 (four) times daily -  with meals and at bedtime. 5 min before meals for radiation induced esophagitis 120 tablet 2   No current facility-administered medications for this encounter.    Physical Findings:  vitals were not taken for this visit.  Pain Assessment Pain Score: 0-No pain/10  Unable to assess due to telephone follow-up visit format.  Lab Findings: Lab Results  Component Value Date   WBC 6.4 08/03/2021   HGB 10.2 (L) 08/03/2021   HCT 31.6 (L) 08/03/2021   MCV 108.2 (H) 08/03/2021   PLT 304 08/03/2021     Radiographic Findings: ECHOCARDIOGRAM COMPLETE  Result Date: 08/10/2021    ECHOCARDIOGRAM REPORT   Patient Name:   Kelly Olson. Date of Exam: 08/10/2021 Medical Rec #:  [id number]                Height:       71.0 in Accession #:    [id number]               Weight:       166.2 lb Date of Birth:  1950-06-13                 BSA:          1.949 m Patient Age:    59 years                 BP:           141/78 mmHg  Patient Gender: M                        HR:           81 bpm. Exam Location:  [facility] Procedure: 2D Echo, Cardiac Doppler, Color Doppler and Intracardiac            Opacification Agent Indications:    I25.10 Coronary artery disease  History:        Patient has prior history of Echocardiogram examinations, most                 recent 10/29/2014. CAD, Prior CABG; Risk Factors:Former Smoker                 and Dyslipidemia. Ischemic cardiomyopathy.  Sonographer:    Wilford Sports Rodgers-Jones RDCS Referring Phys: [place]  1. Left ventricular ejection fraction, by estimation, is 60 to 65%. The left ventricle has normal function. The left ventricle has no regional wall motion abnormalities. Left ventricular diastolic parameters are consistent with Grade I diastolic dysfunction (impaired relaxation).  2. Right ventricular systolic function is normal. The right ventricular size is normal. Tricuspid regurgitation signal is inadequate for assessing PA pressure.  3. The mitral valve is grossly normal. No evidence of mitral valve regurgitation.  4. The aortic valve is tricuspid. Aortic valve regurgitation is not visualized.  5. No significant aortic root or ascending aneurysm.  6. The inferior vena cava is normal in size with greater than 50% respiratory variability, suggesting right atrial pressure of 3 mmHg. Conclusion(s)/Recommendation(s): Normal biventricular function without evidence of hemodynamically significant valvular heart disease. FINDINGS  Left Ventricle: Left ventricular ejection fraction, by estimation, is 60 to 65%. The left ventricle has normal function. The left ventricle has no regional wall motion abnormalities. Definity contrast agent was given IV to delineate the left ventricular  endocardial borders. The left ventricular internal cavity size was normal in size. There is no left ventricular hypertrophy. Abnormal (paradoxical) septal motion consistent with post-operative  status. Left ventricular diastolic parameters are consistent  with Grade I diastolic dysfunction (impaired relaxation). Right Ventricle: The right ventricular size is normal. No increase in right ventricular wall thickness. Right ventricular systolic function is normal. Tricuspid regurgitation signal is inadequate for assessing PA  pressure. Left Atrium: Left atrial size was normal in size. Right Atrium: Right atrial size was normal in size. Pericardium: There is no evidence of pericardial effusion. Mitral Valve: The mitral valve is grossly normal. No evidence of mitral valve regurgitation. Tricuspid Valve: The tricuspid valve is normal in structure. Tricuspid valve regurgitation is not demonstrated. Aortic Valve: The aortic valve is tricuspid. Aortic valve regurgitation is not visualized. Pulmonic Valve: The pulmonic valve was grossly normal. Pulmonic valve regurgitation is not visualized. Aorta: No significant aortic root or ascending aneurysm. Venous: The inferior vena cava is normal in size with greater than 50% respiratory variability, suggesting right atrial pressure of 3 mmHg. IAS/Shunts: No atrial level shunt detected by color flow Doppler.  LEFT VENTRICLE PLAX 2D LVIDd:         4.30 cm   Diastology LVIDs:         2.70 cm   LV e' medial:    7.07 cm/s LV PW:         0.90 cm   LV E/e' medial:  9.9 LV IVS:        0.90 cm   LV e' lateral:   8.59 cm/s LVOT diam:     1.90 cm   LV E/e' lateral: 8.1 LV SV:         49 LV SV Index:   25 LVOT Area:     2.84 cm  RIGHT VENTRICLE             IVC RV Basal diam:  3.50 cm     IVC diam: 1.40 cm RV S prime:     11.90 cm/s TAPSE (M-mode): 1.7 cm LEFT ATRIUM             Index        RIGHT ATRIUM           Index LA diam:        4.10 cm 2.10 cm/m   RA Area:     10.80 cm LA Vol (A2C):   36.7 ml 18.83 ml/m  RA Volume:   22.50 ml  11.54 ml/m LA Vol (A4C):   37.5 ml 19.24 ml/m LA Biplane Vol: 38.1 ml 19.55 ml/m  AORTIC VALVE LVOT Vmax:   86.80 cm/s LVOT Vmean:  59.200 cm/s LVOT  VTI:    0.173 m  AORTA Ao Root diam: 3.75 cm Ao Asc diam:  3.50 cm MITRAL VALVE MV Area (PHT): 3.37 cm    SHUNTS MV Decel Time: 225 msec    Systemic VTI:  0.17 m MV E velocity: 69.70 cm/s  Systemic Diam: 1.90 cm MV A velocity: 89.60 cm/s MV E/A ratio:  0.78 Mary Scientist, physiological signed by Phineas Inches Signature Date/Time: 08/10/2021/5:24:45 PM    Final    CT Chest W Contrast  Result Date: 08/03/2021 CLINICAL DATA:  Small-cell lung cancer, diagnosed in March. Chemotherapy and radiation therapy completed. Right lung cancer 5 years ago with wedge resection. Right lower lobectomy in 2020. * Tracking Code: BO * EXAM: CT CHEST WITH CONTRAST TECHNIQUE: Multidetector CT imaging of the chest was performed during intravenous contrast administration. RADIATION DOSE REDUCTION: This exam was performed according to the departmental dose-optimization program which includes automated exposure control, adjustment of the mA and/or kV according to patient size and/or use of iterative reconstruction technique. CONTRAST:  49mL OMNIPAQUE IOHEXOL 300 MG/ML  SOLN COMPARISON:  04/06/2021 PET.  Chest CT 03/23/2021. FINDINGS: Cardiovascular: Aortic atherosclerosis. Normal heart size, without pericardial effusion. Median sternotomy for CABG. No central  pulmonary embolism, on this non-dedicated study. Ductus remnant along the transverse aorta on 61/2, similar. Mediastinum/Nodes: No supraclavicular adenopathy. No mediastinal or hilar adenopathy. Lungs/Pleura: Trace right pleural fluid, similar. Advanced centrilobular emphysema. Right lower lobectomy. The medial left lower lobe 1.7 cm nodule on the prior exam is decreased in size. Measures 8 mm on 112/5. The more cephalad left lower lobe pulmonary nodule has also decreased in size. 8 mm on 100/5 versus 1.3 x 1.3 cm on the prior exam. The satellite smaller nodule is not readily apparent. Other bilateral pulmonary nodules are similar and identified on series 5. Example lateral left lower  lobe at 7 mm on 124/5. Again identified is interstitial lung disease, with subpleural reticulation, traction bronchiolectasis. Upper Abdomen: Normal imaged portions of the liver, spleen, stomach, pancreas, gallbladder, adrenal glands, kidneys. Musculoskeletal: No acute osseous abnormality. IMPRESSION: 1. Response to therapy of previously hypermetabolic left lower lobe pulmonary nodules. 2. No new or progressive disease. 3. No thoracic adenopathy. 4. Other pulmonary nodules are similar, favored to be benign. 5. Aortic atherosclerosis (ICD10-I70.0) and emphysema (ICD10-J43.9). Electronically Signed   By: Abigail Miyamoto M.D.   On: 08/03/2021 14:36    Impression/Plan: 87. 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 He has recovered well from the effects of his recent chemoradiation. His recent post-treatment CT Chest shows an excellent response to treatment and the plan is to continue in observation only, with repeat CT Chest scan in 3 months, prior to his next scheduled follow up visit with Dr. Julien Nordmann.  2. Prophylactic cranial irradiation-we discussed the increased risk of potential brain metastasis in the setting of small cell lung cancer and the recommendation for PCI to help reduce the risk from approximately 60% down to around 15% as well as an improvement in overall survival.  We reviewed the anticipated 2-week course of daily treatment as well as the acute and late side effects to be expected.  Prior to proceeding with treatment, we will obtain a baseline MRI brain scan, scheduled for later this evening, and pending this scan is negative, we will proceed with CT simulation/treatment planning on Tuesday 08/23/21, in anticipation of beginning a 10 fraction course of daily radiotherapy for total dose of 25 Gy.  If there are any lesions present on the scan, we would still proceed with CT simulation/treatment planning in anticipation of beginning a 10 fraction course  of daily radiotherapy for a total dose of 30 Gy.  The patient was encouraged to ask questions that were answered to his stated satisfaction and he is in agreement to proceed.  He appears to have a good understanding of his disease and is comfortable and in agreement with the stated plan. We will share our discussion with Dr. Julien Nordmann and proceed with treatment planning accordingly.     Nicholos Johns, PA-C

## 2021-08-19 NOTE — Progress Notes (Signed)
  Radiation Oncology         [phone]) [phone] ________________________________  Name: Kelly Olson. MRN: [id number]  Date: 08/23/2021  DOB: 1950-12-27  SIMULATION AND TREATMENT PLANNING NOTE    ICD-10-CM   1. 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  Identity was confirmed.  All relevant records and images related to the planned course of therapy were reviewed.  The patient freely provided informed written consent to proceed with treatment after reviewing the details related to the planned course of therapy. The consent form was witnessed and verified by the simulation staff.  Then, the patient was set-up in a stable reproducible  supine position for radiation therapy.  CT images were obtained.  Surface markings were placed.  The CT images were loaded into the planning software.  Then the target and avoidance structures were contoured.  Treatment planning then occurred.  The radiation prescription was entered and confirmed.  Then, I designed and supervised the construction of a total of 3 medically necessary complex treatment device including a custom made thermoplastic mask used for immobilization, and two MLC collimator apertures for radiotherapy from the right and left side, with independent collimation for each to account for beam divergence.  I have requested : Isodose Plan.    PLAN:  The whole brain will be treated to 25 Gy in 10 fractions to prevent brain metastases.   ------------------------------------------------  Jodelle Gross, MD, PhD

## 2021-08-22 ENCOUNTER — Other Ambulatory Visit: Payer: Self-pay

## 2021-08-23 ENCOUNTER — Ambulatory Visit
Admission: RE | Admit: 2021-08-23 | Discharge: 2021-08-23 | Disposition: A | Discharge status: Home / Self Care | Payer: Medicare Other | Source: Ambulatory Visit | Attending: Radiation Oncology | Admitting: Radiation Oncology

## 2021-08-23 ENCOUNTER — Ambulatory Visit: Payer: Medicare Other | Admitting: Radiation Oncology

## 2021-08-23 ENCOUNTER — Other Ambulatory Visit: Payer: Self-pay

## 2021-08-23 ENCOUNTER — Encounter: Payer: Self-pay | Admitting: Urology

## 2021-08-23 DIAGNOSIS — C3492 Malignant neoplasm of unspecified part of left bronchus or lung: Secondary | ICD-10-CM

## 2021-08-23 DIAGNOSIS — Z510 Encounter for antineoplastic radiation therapy: Secondary | ICD-10-CM | POA: Diagnosis not present

## 2021-08-23 DIAGNOSIS — Z87891 Personal history of nicotine dependence: Secondary | ICD-10-CM | POA: Diagnosis not present

## 2021-08-23 DIAGNOSIS — C3432 Malignant neoplasm of lower lobe, left bronchus or lung: Secondary | ICD-10-CM | POA: Insufficient documentation

## 2021-08-23 DIAGNOSIS — Z298 Encounter for other specified prophylactic measures: Secondary | ICD-10-CM | POA: Diagnosis not present

## 2021-08-24 DIAGNOSIS — Z298 Encounter for other specified prophylactic measures: Secondary | ICD-10-CM | POA: Diagnosis not present

## 2021-08-24 DIAGNOSIS — C3432 Malignant neoplasm of lower lobe, left bronchus or lung: Secondary | ICD-10-CM | POA: Diagnosis not present

## 2021-08-24 DIAGNOSIS — Z87891 Personal history of nicotine dependence: Secondary | ICD-10-CM | POA: Diagnosis not present

## 2021-08-24 DIAGNOSIS — Z510 Encounter for antineoplastic radiation therapy: Secondary | ICD-10-CM | POA: Diagnosis not present

## 2021-08-29 ENCOUNTER — Other Ambulatory Visit: Payer: Self-pay

## 2021-08-29 ENCOUNTER — Ambulatory Visit
Admission: RE | Admit: 2021-08-29 | Discharge: 2021-08-29 | Disposition: A | Discharge status: Home / Self Care | Payer: Medicare Other | Source: Ambulatory Visit | Attending: Radiation Oncology | Admitting: Radiation Oncology

## 2021-08-29 DIAGNOSIS — Z87891 Personal history of nicotine dependence: Secondary | ICD-10-CM | POA: Diagnosis not present

## 2021-08-29 DIAGNOSIS — C3432 Malignant neoplasm of lower lobe, left bronchus or lung: Secondary | ICD-10-CM | POA: Diagnosis not present

## 2021-08-29 DIAGNOSIS — Z298 Encounter for other specified prophylactic measures: Secondary | ICD-10-CM | POA: Diagnosis not present

## 2021-08-29 DIAGNOSIS — Z510 Encounter for antineoplastic radiation therapy: Secondary | ICD-10-CM | POA: Diagnosis not present

## 2021-08-29 LAB — RAD ONC ARIA SESSION SUMMARY
Course Elapsed Days: 0
Plan Fractions Treated to Date: 1
Plan Prescribed Dose Per Fraction: 2.5 Gy
Plan Total Fractions Prescribed: 10
Plan Total Prescribed Dose: 25 Gy
Reference Point Dosage Given to Date: 2.5 Gy
Reference Point Session Dosage Given: 2.5 Gy
Session Number: 1

## 2021-08-30 ENCOUNTER — Other Ambulatory Visit: Payer: Self-pay

## 2021-08-30 ENCOUNTER — Ambulatory Visit
Admission: RE | Admit: 2021-08-30 | Discharge: 2021-08-30 | Disposition: A | Discharge status: Home / Self Care | Payer: Medicare Other | Source: Ambulatory Visit | Attending: Radiation Oncology | Admitting: Radiation Oncology

## 2021-08-30 DIAGNOSIS — C3432 Malignant neoplasm of lower lobe, left bronchus or lung: Secondary | ICD-10-CM | POA: Insufficient documentation

## 2021-08-30 DIAGNOSIS — Z510 Encounter for antineoplastic radiation therapy: Secondary | ICD-10-CM | POA: Insufficient documentation

## 2021-08-30 LAB — RAD ONC ARIA SESSION SUMMARY
Course Elapsed Days: 1
Plan Fractions Treated to Date: 2
Plan Prescribed Dose Per Fraction: 2.5 Gy
Plan Total Fractions Prescribed: 10
Plan Total Prescribed Dose: 25 Gy
Reference Point Dosage Given to Date: 5 Gy
Reference Point Session Dosage Given: 2.5 Gy
Session Number: 2

## 2021-08-31 ENCOUNTER — Other Ambulatory Visit: Payer: Self-pay

## 2021-08-31 ENCOUNTER — Ambulatory Visit
Admission: RE | Admit: 2021-08-31 | Discharge: 2021-08-31 | Disposition: A | Discharge status: Home / Self Care | Payer: Medicare Other | Source: Ambulatory Visit | Attending: Radiation Oncology | Admitting: Radiation Oncology

## 2021-08-31 DIAGNOSIS — C3432 Malignant neoplasm of lower lobe, left bronchus or lung: Secondary | ICD-10-CM | POA: Diagnosis not present

## 2021-08-31 DIAGNOSIS — Z510 Encounter for antineoplastic radiation therapy: Secondary | ICD-10-CM | POA: Diagnosis not present

## 2021-08-31 LAB — RAD ONC ARIA SESSION SUMMARY
Course Elapsed Days: 2
Plan Fractions Treated to Date: 3
Plan Prescribed Dose Per Fraction: 2.5 Gy
Plan Total Fractions Prescribed: 10
Plan Total Prescribed Dose: 25 Gy
Reference Point Dosage Given to Date: 7.5 Gy
Reference Point Session Dosage Given: 2.5 Gy
Session Number: 3

## 2021-09-01 ENCOUNTER — Ambulatory Visit
Admission: RE | Admit: 2021-09-01 | Discharge: 2021-09-01 | Disposition: A | Discharge status: Home / Self Care | Payer: Medicare Other | Source: Ambulatory Visit | Attending: Radiation Oncology | Admitting: Radiation Oncology

## 2021-09-01 ENCOUNTER — Other Ambulatory Visit: Payer: Self-pay

## 2021-09-01 DIAGNOSIS — Z510 Encounter for antineoplastic radiation therapy: Secondary | ICD-10-CM | POA: Diagnosis not present

## 2021-09-01 DIAGNOSIS — C3432 Malignant neoplasm of lower lobe, left bronchus or lung: Secondary | ICD-10-CM | POA: Diagnosis not present

## 2021-09-01 LAB — RAD ONC ARIA SESSION SUMMARY
Course Elapsed Days: 3
Plan Fractions Treated to Date: 4
Plan Prescribed Dose Per Fraction: 2.5 Gy
Plan Total Fractions Prescribed: 10
Plan Total Prescribed Dose: 25 Gy
Reference Point Dosage Given to Date: 10 Gy
Reference Point Session Dosage Given: 2.5 Gy
Session Number: 4

## 2021-09-02 ENCOUNTER — Other Ambulatory Visit: Payer: Self-pay

## 2021-09-02 ENCOUNTER — Ambulatory Visit: Payer: Medicare Other

## 2021-09-02 ENCOUNTER — Ambulatory Visit
Admission: RE | Admit: 2021-09-02 | Discharge: 2021-09-02 | Disposition: A | Discharge status: Home / Self Care | Payer: Medicare Other | Source: Ambulatory Visit | Attending: Radiation Oncology | Admitting: Radiation Oncology

## 2021-09-02 DIAGNOSIS — C3432 Malignant neoplasm of lower lobe, left bronchus or lung: Secondary | ICD-10-CM | POA: Diagnosis not present

## 2021-09-02 DIAGNOSIS — Z87891 Personal history of nicotine dependence: Secondary | ICD-10-CM | POA: Diagnosis not present

## 2021-09-02 DIAGNOSIS — Z510 Encounter for antineoplastic radiation therapy: Secondary | ICD-10-CM | POA: Diagnosis not present

## 2021-09-02 DIAGNOSIS — Z298 Encounter for other specified prophylactic measures: Secondary | ICD-10-CM | POA: Diagnosis not present

## 2021-09-02 LAB — RAD ONC ARIA SESSION SUMMARY
Course Elapsed Days: 4
Plan Fractions Treated to Date: 5
Plan Prescribed Dose Per Fraction: 2.5 Gy
Plan Total Fractions Prescribed: 10
Plan Total Prescribed Dose: 25 Gy
Reference Point Dosage Given to Date: 12.5 Gy
Reference Point Session Dosage Given: 2.5 Gy
Session Number: 5

## 2021-09-05 ENCOUNTER — Other Ambulatory Visit: Payer: Self-pay

## 2021-09-05 ENCOUNTER — Ambulatory Visit
Admission: RE | Admit: 2021-09-05 | Discharge: 2021-09-05 | Disposition: A | Discharge status: Home / Self Care | Payer: Medicare Other | Source: Ambulatory Visit | Attending: Radiation Oncology | Admitting: Radiation Oncology

## 2021-09-05 DIAGNOSIS — Z510 Encounter for antineoplastic radiation therapy: Secondary | ICD-10-CM | POA: Diagnosis not present

## 2021-09-05 DIAGNOSIS — C3432 Malignant neoplasm of lower lobe, left bronchus or lung: Secondary | ICD-10-CM | POA: Diagnosis not present

## 2021-09-05 LAB — RAD ONC ARIA SESSION SUMMARY
Course Elapsed Days: 7
Plan Fractions Treated to Date: 6
Plan Prescribed Dose Per Fraction: 2.5 Gy
Plan Total Fractions Prescribed: 10
Plan Total Prescribed Dose: 25 Gy
Reference Point Dosage Given to Date: 15 Gy
Reference Point Session Dosage Given: 2.5 Gy
Session Number: 6

## 2021-09-06 ENCOUNTER — Ambulatory Visit
Admission: RE | Admit: 2021-09-06 | Discharge: 2021-09-06 | Disposition: A | Discharge status: Home / Self Care | Payer: Medicare Other | Source: Ambulatory Visit | Attending: Radiation Oncology | Admitting: Radiation Oncology

## 2021-09-06 ENCOUNTER — Other Ambulatory Visit: Payer: Self-pay

## 2021-09-06 DIAGNOSIS — C3432 Malignant neoplasm of lower lobe, left bronchus or lung: Secondary | ICD-10-CM | POA: Diagnosis not present

## 2021-09-06 DIAGNOSIS — Z510 Encounter for antineoplastic radiation therapy: Secondary | ICD-10-CM | POA: Diagnosis not present

## 2021-09-06 LAB — RAD ONC ARIA SESSION SUMMARY
Course Elapsed Days: 8
Plan Fractions Treated to Date: 7
Plan Prescribed Dose Per Fraction: 2.5 Gy
Plan Total Fractions Prescribed: 10
Plan Total Prescribed Dose: 25 Gy
Reference Point Dosage Given to Date: 17.5 Gy
Reference Point Session Dosage Given: 2.5 Gy
Session Number: 7

## 2021-09-07 ENCOUNTER — Other Ambulatory Visit: Payer: Self-pay

## 2021-09-07 ENCOUNTER — Ambulatory Visit
Admission: RE | Admit: 2021-09-07 | Discharge: 2021-09-07 | Disposition: A | Discharge status: Home / Self Care | Payer: Medicare Other | Source: Ambulatory Visit | Attending: Radiation Oncology | Admitting: Radiation Oncology

## 2021-09-07 DIAGNOSIS — C3432 Malignant neoplasm of lower lobe, left bronchus or lung: Secondary | ICD-10-CM | POA: Diagnosis not present

## 2021-09-07 DIAGNOSIS — Z510 Encounter for antineoplastic radiation therapy: Secondary | ICD-10-CM | POA: Diagnosis not present

## 2021-09-07 LAB — RAD ONC ARIA SESSION SUMMARY
Course Elapsed Days: 9
Plan Fractions Treated to Date: 8
Plan Prescribed Dose Per Fraction: 2.5 Gy
Plan Total Fractions Prescribed: 10
Plan Total Prescribed Dose: 25 Gy
Reference Point Dosage Given to Date: 20 Gy
Reference Point Session Dosage Given: 2.5 Gy
Session Number: 8

## 2021-09-08 ENCOUNTER — Other Ambulatory Visit: Payer: Self-pay

## 2021-09-08 ENCOUNTER — Ambulatory Visit
Admission: RE | Admit: 2021-09-08 | Discharge: 2021-09-08 | Disposition: A | Discharge status: Home / Self Care | Payer: Medicare Other | Source: Ambulatory Visit | Attending: Radiation Oncology | Admitting: Radiation Oncology

## 2021-09-08 DIAGNOSIS — Z510 Encounter for antineoplastic radiation therapy: Secondary | ICD-10-CM | POA: Diagnosis not present

## 2021-09-08 DIAGNOSIS — C3432 Malignant neoplasm of lower lobe, left bronchus or lung: Secondary | ICD-10-CM | POA: Diagnosis not present

## 2021-09-08 LAB — RAD ONC ARIA SESSION SUMMARY
Course Elapsed Days: 10
Plan Fractions Treated to Date: 9
Plan Prescribed Dose Per Fraction: 2.5 Gy
Plan Total Fractions Prescribed: 10
Plan Total Prescribed Dose: 25 Gy
Reference Point Dosage Given to Date: 22.5 Gy
Reference Point Session Dosage Given: 2.5 Gy
Session Number: 9

## 2021-09-09 ENCOUNTER — Ambulatory Visit
Admission: RE | Admit: 2021-09-09 | Discharge: 2021-09-09 | Disposition: A | Discharge status: Home / Self Care | Payer: Medicare Other | Source: Ambulatory Visit | Attending: Radiation Oncology | Admitting: Radiation Oncology

## 2021-09-09 ENCOUNTER — Other Ambulatory Visit: Payer: Self-pay

## 2021-09-09 DIAGNOSIS — Z298 Encounter for other specified prophylactic measures: Secondary | ICD-10-CM | POA: Diagnosis not present

## 2021-09-09 DIAGNOSIS — Z510 Encounter for antineoplastic radiation therapy: Secondary | ICD-10-CM | POA: Diagnosis not present

## 2021-09-09 DIAGNOSIS — Z87891 Personal history of nicotine dependence: Secondary | ICD-10-CM | POA: Diagnosis not present

## 2021-09-09 DIAGNOSIS — C3432 Malignant neoplasm of lower lobe, left bronchus or lung: Secondary | ICD-10-CM | POA: Diagnosis not present

## 2021-09-09 LAB — RAD ONC ARIA SESSION SUMMARY
Course Elapsed Days: 11
Plan Fractions Treated to Date: 10
Plan Prescribed Dose Per Fraction: 2.5 Gy
Plan Total Fractions Prescribed: 10
Plan Total Prescribed Dose: 25 Gy
Reference Point Dosage Given to Date: 25 Gy
Reference Point Session Dosage Given: 2.5 Gy
Session Number: 10

## 2021-09-28 ENCOUNTER — Other Ambulatory Visit: Payer: Self-pay | Admitting: Cardiology

## 2021-09-28 ENCOUNTER — Encounter: Payer: Self-pay | Admitting: Internal Medicine

## 2021-10-04 ENCOUNTER — Inpatient Hospital Stay: Payer: Medicare Other

## 2021-10-05 ENCOUNTER — Ambulatory Visit (HOSPITAL_COMMUNITY): Payer: Medicare Other

## 2021-10-05 ENCOUNTER — Other Ambulatory Visit: Payer: Self-pay

## 2021-10-05 ENCOUNTER — Inpatient Hospital Stay: Payer: Medicare Other | Attending: Internal Medicine

## 2021-10-05 DIAGNOSIS — C3432 Malignant neoplasm of lower lobe, left bronchus or lung: Secondary | ICD-10-CM | POA: Insufficient documentation

## 2021-10-05 DIAGNOSIS — Z452 Encounter for adjustment and management of vascular access device: Secondary | ICD-10-CM | POA: Insufficient documentation

## 2021-10-05 DIAGNOSIS — Z95828 Presence of other vascular implants and grafts: Secondary | ICD-10-CM

## 2021-10-05 DIAGNOSIS — C3492 Malignant neoplasm of unspecified part of left bronchus or lung: Secondary | ICD-10-CM

## 2021-10-05 MED ORDER — HEPARIN SOD (PORK) LOCK FLUSH 100 UNIT/ML IV SOLN
500.0000 [IU] | Freq: Once | INTRAVENOUS | Status: AC
  Administered 2021-10-05: 500 [IU]

## 2021-10-05 MED ORDER — SODIUM CHLORIDE 0.9% FLUSH
10.0000 mL | Freq: Once | INTRAVENOUS | Status: AC
  Administered 2021-10-05: 10 mL

## 2021-10-20 DIAGNOSIS — Z23 Encounter for immunization: Secondary | ICD-10-CM | POA: Diagnosis not present

## 2021-11-01 ENCOUNTER — Encounter: Payer: Self-pay | Admitting: Urology

## 2021-11-01 NOTE — Progress Notes (Signed)
Telephone appointment. I verified patient's identity and began nursing interview. Patient doing well. No issues reported at this time.  Meaningful use complete.  Patient aware of 8:30am-11/02/21 telephone appointment w/ Ashlyn Bruning PA-C. I left my extension [phone] in case patient needs anything. Patient verbalized understanding.  Patient contact [phone]

## 2021-11-01 NOTE — Progress Notes (Signed)
  Radiation Oncology         [phone]) [phone] ________________________________  Name: Kelly Olson. 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   Beams/energy:   A 3D field set-up was employed with 6 MV X-rays  Narrative: The patient tolerated radiation treatment relatively well with only mild fatigue.  Plan: The patient has completed radiation treatment. The patient will return to radiation oncology clinic for routine followup in one month. I advised him to call or return sooner if he has any questions or concerns related to his recovery or treatment. ________________________________  Sheral Apley. Tammi Klippel, M.D.

## 2021-11-01 NOTE — Progress Notes (Signed)
Radiation Oncology         [phone]) [phone] ________________________________  Name: Kelly Olson. 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   05/05/21 - 06/20/21:  The primary tumor and involved mediastinal adenopathy were treated to 66 Gy in 33 fractions of 2 Gy; concurrent with chemotherapy.  Narrative:  I spoke with the patient to conduct his routine scheduled 1 month follow up visit via telephone to spare the patient unnecessary potential exposure in the healthcare setting during the current COVID-19 pandemic.  The patient was notified in advance and gave permission to proceed with this visit format.  He tolerated the recent PCI  treatment relatively well with only mild fatigue and he remains without complaints.  His initial post-treatment CT Chest on 08/03/21 showed an excellent response to treatment with significantly decreased size of the recently treated LLL lung nodule, no thoracic adenopathy and no new or progressive disease.                             On review of systems, the patient states that he is doing well in general.  He specifically denies headaches, visual changes, dizziness/imbalance, focal weakness, tremors, seizure activity recent weight loss or night sweats. The dysphagia that he experienced with the chemoradiation has now completely resolved.  His energy level is also gradually improving and he has been able to remain active. He does continue with shortness of breath with exertion, unchanged recently. He denies productive cough or hemoptysis, nausea, vomiting, diarrhea or constipation. Otherwise, he is without complaints and is quite pleased with his progress  to date.    ALLERGIES:  has No Known Allergies.  Meds: Current Outpatient Medications  Medication Sig Dispense Refill   acetaminophen (TYLENOL) 325 MG tablet Take 650 mg by mouth every 6 (six) hours as needed for moderate pain.     aspirin 81 MG tablet Take 81 mg by mouth daily.     atorvastatin (LIPITOR) 40 MG tablet TAKE 1 TABLET AT BEDTIME 90 tablet 3   carvedilol (COREG) 3.125 MG tablet TAKE 2 TABLETS TWICE A DAY WITH     MEALS 360 tablet 2   cimetidine (TAGAMET) 200 MG tablet      lidocaine-prilocaine (EMLA) cream Apply to the Port-A-Cath site 30 minutes before chemotherapy (Patient not taking: Reported on 06/14/2021) 30 g 0   losartan (COZAAR) 25 MG tablet TAKE 1 TABLET DAILY 90 tablet 3   OVER THE COUNTER MEDICATION [employer] sleep aid     prochlorperazine (COMPAZINE) 10 MG tablet Take 1 tablet (10 mg total) by mouth every 6 (six) hours as needed for nausea or vomiting. (Patient not taking: Reported on 06/14/2021) 30 tablet 0   Pseudoephedrine-APAP 30-325 MG TABS      sildenafil (VIAGRA) 50 MG tablet Take 1 tablet (50 mg total) by mouth daily as needed for erectile dysfunction. 10 tablet 5   sucralfate (CARAFATE) 1 g tablet Take 1 tablet (1 g total) by mouth 4 (four) times daily -  with meals and at bedtime. 5 min before meals for radiation induced esophagitis 120  tablet 2   No current facility-administered medications for this encounter.    Physical Findings:  vitals were not taken for this visit.  Pain Assessment Pain Score: 0-No pain/10 Unable to assess due to telephone follow-up visit format.  Lab Findings: Lab Results  Component Value Date   WBC 6.4 08/03/2021   HGB 10.2 (L) 08/03/2021   HCT 31.6 (L) 08/03/2021   MCV 108.2 (H) 08/03/2021   PLT 304 08/03/2021     Radiographic Findings: No results found.  Impression/Plan: 9. 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 He has recovered well from the effects of his recent prophylactic brain  radiation as well as the prior chemoradiation. His initial post-treatment CT Chest showed an excellent response to treatment and he is scheduled for his next chest imaging on 11/04/21, prior to his follow up visit with Dr. Julien Nordmann on 11/08/21. Pending all remains stable, the plan is to continue in observation only, with serial repeat CT Chest scans every 3 months under the care and direction of Dr. Julien Nordmann. We discussed that while we are happy to continue to participate in his care if clinically indicated, at this point, we will plan to see him back on an as needed basis. He will continue in routine follow up under the care and direction of Dr. Julien Nordmann and we look forward to following his progress via correspondence.    Nicholos Johns, PA-C

## 2021-11-02 ENCOUNTER — Ambulatory Visit
Admission: RE | Admit: 2021-11-02 | Discharge: 2021-11-02 | Disposition: A | Discharge status: Home / Self Care | Payer: Medicare Other | Source: Ambulatory Visit | Attending: Urology | Admitting: Urology

## 2021-11-02 DIAGNOSIS — C3492 Malignant neoplasm of unspecified part of left bronchus or lung: Secondary | ICD-10-CM

## 2021-11-04 ENCOUNTER — Inpatient Hospital Stay: Payer: Medicare Other | Attending: Internal Medicine

## 2021-11-04 ENCOUNTER — Ambulatory Visit (HOSPITAL_COMMUNITY)
Admission: RE | Admit: 2021-11-04 | Discharge: 2021-11-04 | Disposition: A | Discharge status: Home / Self Care | Payer: Medicare Other | Source: Ambulatory Visit | Attending: Internal Medicine | Admitting: Internal Medicine

## 2021-11-04 DIAGNOSIS — Z95828 Presence of other vascular implants and grafts: Secondary | ICD-10-CM

## 2021-11-04 DIAGNOSIS — C3432 Malignant neoplasm of lower lobe, left bronchus or lung: Secondary | ICD-10-CM | POA: Insufficient documentation

## 2021-11-04 DIAGNOSIS — E119 Type 2 diabetes mellitus without complications: Secondary | ICD-10-CM | POA: Insufficient documentation

## 2021-11-04 DIAGNOSIS — C3490 Malignant neoplasm of unspecified part of unspecified bronchus or lung: Secondary | ICD-10-CM | POA: Insufficient documentation

## 2021-11-04 DIAGNOSIS — R918 Other nonspecific abnormal finding of lung field: Secondary | ICD-10-CM | POA: Diagnosis not present

## 2021-11-04 DIAGNOSIS — J439 Emphysema, unspecified: Secondary | ICD-10-CM | POA: Diagnosis not present

## 2021-11-04 DIAGNOSIS — C3492 Malignant neoplasm of unspecified part of left bronchus or lung: Secondary | ICD-10-CM

## 2021-11-04 LAB — CMP (CANCER CENTER ONLY)
ALT: 16 U/L (ref 0–44)
AST: 18 U/L (ref 15–41)
Albumin: 4.1 g/dL (ref 3.5–5.0)
Alkaline Phosphatase: 99 U/L (ref 38–126)
Anion gap: 8 (ref 5–15)
BUN: 21 mg/dL (ref 8–23)
CO2: 28 mmol/L (ref 22–32)
Calcium: 9 mg/dL (ref 8.9–10.3)
Chloride: 102 mmol/L (ref 98–111)
Creatinine: 0.75 mg/dL (ref 0.61–1.24)
GFR, Estimated: >60 mL/min (ref >60)
Glucose, Bld: 125 mg/dL — ABNORMAL HIGH (ref 70–99)
Potassium: 4.4 mmol/L (ref 3.5–5.1)
Sodium: 138 mmol/L (ref 135–145)
Total Bilirubin: 0.5 mg/dL (ref 0.3–1.2)
Total Protein: 7.3 g/dL (ref 6.5–8.1)

## 2021-11-04 LAB — CBC WITH DIFFERENTIAL (CANCER CENTER ONLY)
Abs Immature Granulocytes: 0.03 10*3/uL (ref 0.00–0.07)
Basophils Absolute: 0 10*3/uL (ref 0.0–0.1)
Basophils Relative: 1 %
Eosinophils Absolute: 0.1 10*3/uL (ref 0.0–0.5)
Eosinophils Relative: 1 %
HCT: 42.4 % (ref 39.0–52.0)
Hemoglobin: 14.3 g/dL (ref 13.0–17.0)
Immature Granulocytes: 0 %
Lymphocytes Relative: 7 %
Lymphs Abs: 0.6 10*3/uL — ABNORMAL LOW (ref 0.7–4.0)
MCH: 33.6 pg (ref 26.0–34.0)
MCHC: 33.7 g/dL (ref 30.0–36.0)
MCV: 99.8 fL (ref 80.0–100.0)
Monocytes Absolute: 0.9 10*3/uL (ref 0.1–1.0)
Monocytes Relative: 11 %
Neutro Abs: 6.7 10*3/uL (ref 1.7–7.7)
Neutrophils Relative %: 80 %
Platelet Count: 228 10*3/uL (ref 150–400)
RBC: 4.25 MIL/uL (ref 4.22–5.81)
RDW: 11.9 % (ref 11.5–15.5)
WBC Count: 8.3 10*3/uL (ref 4.0–10.5)
nRBC: 0 % (ref 0.0–0.2)

## 2021-11-04 MED ORDER — SODIUM CHLORIDE (PF) 0.9 % IJ SOLN
INTRAMUSCULAR | Status: AC
  Filled 2021-11-04: qty 50

## 2021-11-04 MED ORDER — SODIUM CHLORIDE 0.9% FLUSH
10.0000 mL | Freq: Once | INTRAVENOUS | Status: AC
  Administered 2021-11-04: 10 mL

## 2021-11-04 MED ORDER — HEPARIN SOD (PORK) LOCK FLUSH 100 UNIT/ML IV SOLN
500.0000 [IU] | Freq: Once | INTRAVENOUS | Status: AC
  Administered 2021-11-04: 500 [IU] via INTRAVENOUS

## 2021-11-04 MED ORDER — HEPARIN SOD (PORK) LOCK FLUSH 100 UNIT/ML IV SOLN
INTRAVENOUS | Status: AC
  Filled 2021-11-04: qty 5

## 2021-11-04 MED ORDER — IOHEXOL 300 MG/ML  SOLN
75.0000 mL | Freq: Once | INTRAMUSCULAR | Status: AC | PRN
  Administered 2021-11-04: 75 mL via INTRAVENOUS

## 2021-11-08 ENCOUNTER — Inpatient Hospital Stay (HOSPITAL_BASED_OUTPATIENT_CLINIC_OR_DEPARTMENT_OTHER): Payer: Medicare Other | Admitting: Internal Medicine

## 2021-11-08 ENCOUNTER — Encounter: Payer: Self-pay | Admitting: Internal Medicine

## 2021-11-08 VITALS — BP 134/79 | HR 93 | Temp 98.3°F | Resp 16 | Wt 163.1 lb

## 2021-11-08 DIAGNOSIS — E119 Type 2 diabetes mellitus without complications: Secondary | ICD-10-CM | POA: Diagnosis not present

## 2021-11-08 DIAGNOSIS — C3432 Malignant neoplasm of lower lobe, left bronchus or lung: Secondary | ICD-10-CM | POA: Diagnosis not present

## 2021-11-08 DIAGNOSIS — C3490 Malignant neoplasm of unspecified part of unspecified bronchus or lung: Secondary | ICD-10-CM | POA: Diagnosis not present

## 2021-11-08 NOTE — Progress Notes (Signed)
[place] Telephone:([phone]) [phone]   Fax:(336) [phone]  OFFICE PROGRESS NOTE  [name], MD No address on file  DIAGNOSIS: Unresectable limited stage IIb (T3, N0, M0) small cell lung cancer presented with 2 pulmonary nodules in the left lower lobe with no evidence of metastatic disease to the lymph nodes or distant metastasis diagnosed in March 2023  PRIOR THERAPY: Systemic chemotherapy with carboplatin for AUC of 5 and etoposide 100 Mg/M2 on days 1, 2 and 3 every 3 weeks.  First dose started 05/02/2021.  Status post 4 cycles.  This is concurrent with radiotherapy under the care of Dr. Tammi Klippel.  CURRENT THERAPY: Observation  INTERVAL HISTORY: Kelly Olson. 71 y.o. male returns to the clinic today for follow-up visit accompanied by his wife.  The patient is feeling fine today with no concerning complaints except for mild cough as well as shortness of breath with exertion but no significant chest pain or hemoptysis.  He denied having any nausea, vomiting, diarrhea or constipation.  He has no headache or visual changes.  He has no fever or chills.  He lost few pounds recently.  He denied having any other complaints.  He had repeat CT scan of the chest performed recently and he is here for evaluation and discussion of his scan results.  MEDICAL HISTORY: Past Medical History:  Diagnosis Date   Coronary artery disease, occlusive 06/02/2014   Multivessel CAD.mLAD-100%, mRCA 99%, dRCA 100%, mLCX 90%  And EF 35-45%. -->  Referred for CABG; nonischemic Myoview March 2020   Former heavy tobacco smoker    Quit in April 2016    GERD (gastroesophageal reflux disease)    Hyperlipidemia with target LDL less than 70    Hypertension    Ischemic cardiomyopathy - resolved 05/2014   Myoview: EF ~33% with "infarction vs. severe resting ischemia in LAD & RCA territory; b) EF by Cath: 35-45%. 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   Stage T2 a, N0    ALLERGIES:  has No Known Allergies.  MEDICATIONS:  Current Outpatient Medications  Medication Sig Dispense Refill   acetaminophen (TYLENOL) 325 MG tablet Take 650 mg by mouth every 6 (six) hours as needed for moderate pain.     aspirin 81 MG tablet Take 81 mg by mouth daily.     atorvastatin (LIPITOR) 40 MG tablet TAKE 1 TABLET AT BEDTIME 90 tablet 3   carvedilol (COREG) 3.125 MG tablet TAKE 2 TABLETS TWICE A DAY WITH     MEALS 360 tablet 2   cimetidine (TAGAMET) 200 MG tablet      lidocaine-prilocaine (EMLA) cream Apply to the Port-A-Cath site 30 minutes before chemotherapy (Patient not taking: Reported on 06/14/2021) 30 g 0   losartan (COZAAR) 25 MG tablet TAKE 1 TABLET DAILY 90 tablet 3   OVER THE COUNTER MEDICATION [employer] sleep aid     prochlorperazine (COMPAZINE) 10 MG tablet Take 1 tablet (10 mg total) by mouth every 6 (six) hours as needed for nausea or vomiting. (Patient not taking: Reported on 06/14/2021) 30 tablet 0   Pseudoephedrine-APAP 30-325 MG TABS      sildenafil (VIAGRA) 50 MG tablet  Take 1 tablet (50 mg total) by mouth daily as needed for erectile dysfunction. 10 tablet 5   sucralfate (CARAFATE) 1 g tablet Take 1 tablet (1 g total) by mouth 4 (four) times daily -  with meals and at bedtime. 5 min before meals for radiation induced esophagitis 120 tablet 2   No current facility-administered medications for this visit.    SURGICAL HISTORY:  Past Surgical History:  Procedure Laterality Date   CARDIAC CATHETERIZATION N/A 06/02/2014   Procedure: Left Heart Cath and Coronary Angiography;  Surgeon: Leonie Man, MD;  Location: [facility] INVASIVE CV LAB CUPID;  Service: Cardiovascular;  mLAD-100%, mRCA 99%, dRCA 100%, mLCX 90%  And EF  35-45%.   CORONARY ARTERY BYPASS GRAFT N/A 06/04/2014   Procedure: CORONARY ARTERY BYPASS GRAFTING (CABG) times four using bilateral internal mammary arteries and EVH for left  leg saphenous vein;  Surgeon: Gaye Pollack, MD;  Location: [place] OR;  Service: Open Heart Surgery;  LIMA-OM, RIMA-LAD, SVG-Diag, SVG-rPDA   IR IMAGING GUIDED PORT INSERTION  05/16/2021   NM MYOVIEW LTD  05/28/2014   Pre-CABG:  High Risk Nuclear Stress Test with multivessel distribution ischemia.  -- Severely ischemic Cardiomyoapthy with evidence of at least 2 vessel disease & EF of ~33%.  Images are consistent with either infarction or severe resting ischemia in the LAD & RCA territory.   NM MYOVIEW LTD  03/2018   EF 50-55%.  Small basal inferior fixed defect consistent with diaphragmatic attenuation.  No ischemia or infarction.  Septal dyskinesis due to post CABG state   TEE WITHOUT CARDIOVERSION N/A 06/04/2014   Procedure: TRANSESOPHAGEAL ECHOCARDIOGRAM (TEE);  Surgeon: Gaye Pollack, MD;  Location: [place];  Service: Open Heart Surgery;  Laterality: N/A;   THORACOTOMY Right 07/10/2016   Procedure: THORACOTOMY;  Surgeon: Gaye Pollack, MD;  Location: [facility] OR;  Service: Thoracic;  Laterality: Right;   THORACOTOMY/LOBECTOMY Right 07/29/2018   Procedure: THORACOTOMY/RIGHT LOWER LOBECTOMY;  Surgeon: Gaye Pollack, MD;  Location: [facility] OR;  Service: Thoracic;  Laterality: Right;   [place] ECHOCARDIOGRAM  10/2014   Mild concentric LVH. EF 50-55% with mild HK of basal anteroseptal myocardium. GR 1 DD.   VIDEO ASSISTED THORACOSCOPY (VATS)/WEDGE RESECTION Right 07/10/2016   Procedure: VIDEO ASSISTED THORACOSCOPY (VATS)/WEDGE RESECTION;  Surgeon: Gaye Pollack, MD;  Location: [facility] OR;  Service: Thoracic;  Laterality: Right;   VIDEO BRONCHOSCOPY WITH ENDOBRONCHIAL NAVIGATION N/A 04/13/2021   Procedure: VIDEO BRONCHOSCOPY WITH ENDOBRONCHIAL NAVIGATION;  Surgeon: Melrose Nakayama, MD;  Location: [place];  Service:  Thoracic;  Laterality: N/A;    REVIEW OF SYSTEMS:  Constitutional: positive for fatigue Eyes: negative Ears, nose, mouth, throat, and face: negative Respiratory: positive for cough and dyspnea on exertion Cardiovascular: negative Gastrointestinal: negative Genitourinary:negative Integument/breast: negative Hematologic/lymphatic: negative Musculoskeletal:negative Neurological: negative Behavioral/Psych: negative Endocrine: negative Allergic/Immunologic: negative   PHYSICAL EXAMINATION: General appearance: alert, cooperative, fatigued, and no distress Head: Normocephalic, without obvious abnormality, atraumatic Neck: no adenopathy, no JVD, supple, symmetrical, trachea midline, and thyroid not enlarged, symmetric, no tenderness/mass/nodules Lymph nodes: Cervical, supraclavicular, and axillary nodes normal. Resp: clear to auscultation bilaterally Back: symmetric, no curvature. ROM normal. No CVA tenderness. Cardio: regular rate and rhythm, S1, S2 normal, no murmur, click, rub or gallop GI: soft, non-tender; bowel sounds normal; no masses,  no organomegaly Extremities: extremities normal, atraumatic, no cyanosis or edema Neurologic: Alert and oriented X 3, normal strength and tone. Normal symmetric reflexes. Normal coordination and gait  ECOG  PERFORMANCE STATUS: 1 - Symptomatic but completely ambulatory  Blood pressure 134/79, pulse 93, temperature 98.3 F (36.8 C), temperature source Oral, resp. rate 16, weight 163 lb 1.6 oz (74 kg), SpO2 93 %.  LABORATORY DATA: Lab Results  Component Value Date   WBC 8.3 11/04/2021   HGB 14.3 11/04/2021   HCT 42.4 11/04/2021   MCV 99.8 11/04/2021   PLT 228 11/04/2021      Chemistry      Component Value Date/Time   NA 138 11/04/2021 0819   NA 142 04/08/2018 0858   K 4.4 11/04/2021 0819   CL 102 11/04/2021 0819   CO2 28 11/04/2021 0819   BUN 21 11/04/2021 0819   BUN 24 04/08/2018 0858   CREATININE 0.75 11/04/2021 0819   CREATININE  0.85 01/03/2016 0840      Component Value Date/Time   CALCIUM 9.0 11/04/2021 0819   ALKPHOS 99 11/04/2021 0819   AST 18 11/04/2021 0819   ALT 16 11/04/2021 0819   BILITOT 0.5 11/04/2021 0819       RADIOGRAPHIC STUDIES: CT Chest W Contrast  Result Date: 11/06/2021 CLINICAL DATA:  71 year old male with history of small cell lung cancer. Follow-up examination. * Tracking Code: BO * EXAM: CT CHEST WITH CONTRAST TECHNIQUE: Multidetector CT imaging of the chest was performed during intravenous contrast administration. RADIATION DOSE REDUCTION: This exam was performed according to the departmental dose-optimization program which includes automated exposure control, adjustment of the mA and/or kV according to patient size and/or use of iterative reconstruction technique. CONTRAST:  27mL OMNIPAQUE IOHEXOL 300 MG/ML  SOLN COMPARISON:  Chest CT 08/03/2021. FINDINGS: Cardiovascular: Heart size is normal. There is no significant pericardial fluid, thickening or pericardial calcification. There is aortic atherosclerosis, as well as atherosclerosis of the great vessels of the mediastinum and the coronary arteries, including calcified atherosclerotic plaque in the left main, left anterior descending, left circumflex and right coronary arteries. Status post median sternotomy for CABG including LIMA to the LAD. Right internal jugular single-lumen Port-A-Cath with tip terminating in the distal superior vena cava. Mediastinum/Nodes: No pathologically enlarged mediastinal or hilar lymph nodes. Esophagus is unremarkable in appearance. No axillary lymphadenopathy. Lungs/Pleura: Status post right lower lobectomy. Compensatory hyperexpansion of the right middle and upper lobes. Throughout the mid to lower left lung, particularly in the left lower lobe there are increasing areas of ground-glass attenuation and septal thickening, most likely to reflect evolving postradiation changes. While several of the larger pulmonary  nodules seen on prior examinations in the posterior aspect of the left lower lobe have resolved, other nodules in the base of the left lower lobe on today's examination are stable (axial image 114 of series 5 demonstrates a 9 x 8 mm pulmonary nodule which is unchanged), or new compared to prior studies. The largest of the new pulmonary nodules in the base of the left lower lobe (axial image 123 of series 5) measures 10 x 8 mm. No suspicious appearing pulmonary nodules or masses are noted in the right lung. Trace amount of chronic right-sided pleural thickening and calcification, stable. No pleural effusions. Diffuse bronchial wall thickening with moderate centrilobular and paraseptal emphysema. Upper Abdomen: Atherosclerosis in the abdominal aorta. Colonic diverticulosis in the transverse colon. Musculoskeletal: Median sternotomy wires. There are no aggressive appearing lytic or blastic lesions noted in the visualized portions of the skeleton. IMPRESSION: 1. Today's study demonstrates evolving postradiation changes in the left lower lobe with resolution of the previously noted larger pulmonary nodules in the posterior aspect of  the left lower lobe. However, other nodules in the base of the left lower lobe on today's examination are stable or increased compared to the prior study, as discussed above. Close attention on follow-up studies is recommended to ensure regression, as the possibility of new metastatic lesions or developing lymphangitic spread of tumor in this region is not excluded. 2. Diffuse bronchial wall thickening with moderate centrilobular and paraseptal emphysema; imaging findings suggestive of underlying COPD. 3. Aortic atherosclerosis, in addition to left main and three-vessel coronary artery disease. Status post median sternotomy for CABG including LIMA to the LAD. 4. Colonic diverticulosis. Aortic Atherosclerosis (ICD10-I70.0) and Emphysema (ICD10-J43.9). Electronically Signed   By: Vinnie Langton M.D.   On: 11/06/2021 06:50    ASSESSMENT AND PLAN: This is a very pleasant 71 years old white male diagnosed with unresectable limited stage (T3, N0, M0 small cell lung cancer presented with 2 nodules in the left lower lobe with no evidence of metastatic disease to the thoracic lymph nodes or distant metastases diagnosed in March 2023. The patient underwent a course of systemic chemotherapy with carboplatin for AUC of 5 on day 1 and etoposide 100 Mg/M2 on days 1, 2 and 3 status post 4 cycles.  This is concurrent with radiotherapy.   This was followed by prophylactic cranial irradiation under the care of Dr. Tammi Klippel. The patient is currently on observation and he is feeling fine with no concerning complaints. He had repeat CT scan of the chest performed recently.  I personally and independently reviewed the scan images and discussed the result and showed the images to the patient and his wife. His scan showed no clear evidence for disease progression but there was evolving postradiation changes with resolution of the previously noted large pulmonary nodules in the posterior aspect of the left lower lobe however other nodules are stable or slightly increased in size and need close monitoring. I recommended for the patient to continue on observation with repeat CT scan of the chest in 3 months for further evaluation of these areas and to rule out disease recurrence but he was also advised to call immediately if he has any concerning issues in the interval. Regarding his diabetes mellitus, he is supposed to see Dr. Milinda Hirschfeld in few months for management of his condition after Dr. Laurann Montana retired.  The patient voices understanding of current disease status and treatment options and is in agreement with the current care plan.  All questions were answered. The patient knows to call the clinic with any problems, questions or concerns. We can certainly see the patient much sooner if necessary.  The  total time spent in the appointment was 30 minutes.  Disclaimer: This note was dictated with voice recognition software. Similar sounding words can inadvertently be transcribed and may not be corrected upon review.

## 2021-11-09 ENCOUNTER — Other Ambulatory Visit: Payer: Self-pay | Admitting: Physician Assistant

## 2021-11-09 ENCOUNTER — Other Ambulatory Visit: Payer: Self-pay

## 2021-11-09 DIAGNOSIS — E119 Type 2 diabetes mellitus without complications: Secondary | ICD-10-CM | POA: Diagnosis not present

## 2021-11-10 DIAGNOSIS — I700 Atherosclerosis of aorta: Secondary | ICD-10-CM | POA: Diagnosis not present

## 2021-11-10 DIAGNOSIS — E1169 Type 2 diabetes mellitus with other specified complication: Secondary | ICD-10-CM | POA: Diagnosis not present

## 2021-11-10 DIAGNOSIS — J449 Chronic obstructive pulmonary disease, unspecified: Secondary | ICD-10-CM | POA: Diagnosis not present

## 2021-11-10 DIAGNOSIS — C3490 Malignant neoplasm of unspecified part of unspecified bronchus or lung: Secondary | ICD-10-CM | POA: Diagnosis not present

## 2021-11-16 DIAGNOSIS — E119 Type 2 diabetes mellitus without complications: Secondary | ICD-10-CM | POA: Diagnosis not present

## 2021-11-30 DIAGNOSIS — Z1331 Encounter for screening for depression: Secondary | ICD-10-CM | POA: Diagnosis not present

## 2021-11-30 DIAGNOSIS — I25810 Atherosclerosis of coronary artery bypass graft(s) without angina pectoris: Secondary | ICD-10-CM | POA: Diagnosis not present

## 2021-11-30 DIAGNOSIS — Z6821 Body mass index (BMI) 21.0-21.9, adult: Secondary | ICD-10-CM | POA: Diagnosis not present

## 2021-11-30 DIAGNOSIS — E119 Type 2 diabetes mellitus without complications: Secondary | ICD-10-CM | POA: Diagnosis not present

## 2021-11-30 DIAGNOSIS — J449 Chronic obstructive pulmonary disease, unspecified: Secondary | ICD-10-CM | POA: Diagnosis not present

## 2021-12-13 ENCOUNTER — Inpatient Hospital Stay: Payer: Medicare Other | Attending: Internal Medicine

## 2021-12-13 ENCOUNTER — Other Ambulatory Visit: Payer: Self-pay

## 2021-12-13 DIAGNOSIS — C3432 Malignant neoplasm of lower lobe, left bronchus or lung: Secondary | ICD-10-CM | POA: Insufficient documentation

## 2021-12-13 DIAGNOSIS — C3492 Malignant neoplasm of unspecified part of left bronchus or lung: Secondary | ICD-10-CM

## 2021-12-13 DIAGNOSIS — Z95828 Presence of other vascular implants and grafts: Secondary | ICD-10-CM

## 2021-12-13 DIAGNOSIS — Z452 Encounter for adjustment and management of vascular access device: Secondary | ICD-10-CM | POA: Insufficient documentation

## 2021-12-13 MED ORDER — HEPARIN SOD (PORK) LOCK FLUSH 100 UNIT/ML IV SOLN
500.0000 [IU] | Freq: Once | INTRAVENOUS | Status: AC
  Administered 2021-12-13: 500 [IU]

## 2021-12-13 MED ORDER — SODIUM CHLORIDE 0.9% FLUSH
10.0000 mL | Freq: Once | INTRAVENOUS | Status: AC
  Administered 2021-12-13: 10 mL

## 2021-12-14 ENCOUNTER — Other Ambulatory Visit: Payer: Self-pay | Admitting: Physician Assistant

## 2021-12-14 ENCOUNTER — Ambulatory Visit
Admission: RE | Admit: 2021-12-14 | Discharge: 2021-12-14 | Disposition: A | Discharge status: Home / Self Care | Payer: Medicare Other | Source: Ambulatory Visit | Attending: Physician Assistant | Admitting: Physician Assistant

## 2021-12-14 DIAGNOSIS — R0781 Pleurodynia: Secondary | ICD-10-CM | POA: Diagnosis not present

## 2021-12-14 DIAGNOSIS — E119 Type 2 diabetes mellitus without complications: Secondary | ICD-10-CM | POA: Diagnosis not present

## 2021-12-14 DIAGNOSIS — Z85118 Personal history of other malignant neoplasm of bronchus and lung: Secondary | ICD-10-CM | POA: Diagnosis not present

## 2021-12-14 DIAGNOSIS — J449 Chronic obstructive pulmonary disease, unspecified: Secondary | ICD-10-CM | POA: Diagnosis not present

## 2021-12-14 DIAGNOSIS — R918 Other nonspecific abnormal finding of lung field: Secondary | ICD-10-CM | POA: Diagnosis not present

## 2021-12-14 DIAGNOSIS — J439 Emphysema, unspecified: Secondary | ICD-10-CM | POA: Diagnosis not present

## 2021-12-16 ENCOUNTER — Encounter (HOSPITAL_COMMUNITY): Payer: Self-pay

## 2021-12-16 ENCOUNTER — Inpatient Hospital Stay (HOSPITAL_COMMUNITY)
Admission: EM | Admit: 2021-12-16 | Discharge: 2021-12-20 | DRG: 644 | Disposition: A | Discharge status: Home / Self Care | Payer: Medicare Other | Attending: Internal Medicine | Admitting: Internal Medicine

## 2021-12-16 ENCOUNTER — Other Ambulatory Visit: Payer: Self-pay

## 2021-12-16 DIAGNOSIS — Z8249 Family history of ischemic heart disease and other diseases of the circulatory system: Secondary | ICD-10-CM

## 2021-12-16 DIAGNOSIS — E222 Syndrome of inappropriate secretion of antidiuretic hormone: Secondary | ICD-10-CM | POA: Diagnosis present

## 2021-12-16 DIAGNOSIS — I255 Ischemic cardiomyopathy: Secondary | ICD-10-CM | POA: Diagnosis present

## 2021-12-16 DIAGNOSIS — Z951 Presence of aortocoronary bypass graft: Secondary | ICD-10-CM | POA: Diagnosis not present

## 2021-12-16 DIAGNOSIS — Z7982 Long term (current) use of aspirin: Secondary | ICD-10-CM

## 2021-12-16 DIAGNOSIS — E875 Hyperkalemia: Secondary | ICD-10-CM | POA: Diagnosis present

## 2021-12-16 DIAGNOSIS — Z825 Family history of asthma and other chronic lower respiratory diseases: Secondary | ICD-10-CM

## 2021-12-16 DIAGNOSIS — E785 Hyperlipidemia, unspecified: Secondary | ICD-10-CM | POA: Diagnosis not present

## 2021-12-16 DIAGNOSIS — Z87891 Personal history of nicotine dependence: Secondary | ICD-10-CM

## 2021-12-16 DIAGNOSIS — I10 Essential (primary) hypertension: Secondary | ICD-10-CM

## 2021-12-16 DIAGNOSIS — E872 Acidosis, unspecified: Secondary | ICD-10-CM | POA: Diagnosis present

## 2021-12-16 DIAGNOSIS — E119 Type 2 diabetes mellitus without complications: Secondary | ICD-10-CM | POA: Diagnosis present

## 2021-12-16 DIAGNOSIS — Z923 Personal history of irradiation: Secondary | ICD-10-CM | POA: Diagnosis not present

## 2021-12-16 DIAGNOSIS — Z79899 Other long term (current) drug therapy: Secondary | ICD-10-CM

## 2021-12-16 DIAGNOSIS — I110 Hypertensive heart disease with heart failure: Secondary | ICD-10-CM | POA: Diagnosis present

## 2021-12-16 DIAGNOSIS — Z9221 Personal history of antineoplastic chemotherapy: Secondary | ICD-10-CM | POA: Diagnosis not present

## 2021-12-16 DIAGNOSIS — I2510 Atherosclerotic heart disease of native coronary artery without angina pectoris: Secondary | ICD-10-CM | POA: Diagnosis present

## 2021-12-16 DIAGNOSIS — R053 Chronic cough: Secondary | ICD-10-CM | POA: Diagnosis not present

## 2021-12-16 DIAGNOSIS — E871 Hypo-osmolality and hyponatremia: Secondary | ICD-10-CM | POA: Diagnosis present

## 2021-12-16 DIAGNOSIS — I5032 Chronic diastolic (congestive) heart failure: Secondary | ICD-10-CM | POA: Diagnosis present

## 2021-12-16 DIAGNOSIS — Z902 Acquired absence of lung [part of]: Secondary | ICD-10-CM

## 2021-12-16 DIAGNOSIS — Z85118 Personal history of other malignant neoplasm of bronchus and lung: Secondary | ICD-10-CM | POA: Diagnosis not present

## 2021-12-16 DIAGNOSIS — R1310 Dysphagia, unspecified: Secondary | ICD-10-CM | POA: Diagnosis not present

## 2021-12-16 DIAGNOSIS — K219 Gastro-esophageal reflux disease without esophagitis: Secondary | ICD-10-CM | POA: Diagnosis present

## 2021-12-16 DIAGNOSIS — C3492 Malignant neoplasm of unspecified part of left bronchus or lung: Secondary | ICD-10-CM | POA: Diagnosis not present

## 2021-12-16 DIAGNOSIS — R197 Diarrhea, unspecified: Secondary | ICD-10-CM | POA: Diagnosis present

## 2021-12-16 LAB — CBC WITH DIFFERENTIAL/PLATELET
Abs Immature Granulocytes: 0.03 10*3/uL (ref 0.00–0.07)
Basophils Absolute: 0.1 10*3/uL (ref 0.0–0.1)
Basophils Relative: 1 %
Eosinophils Absolute: 0.1 10*3/uL (ref 0.0–0.5)
Eosinophils Relative: 1 %
HCT: 40.5 % (ref 39.0–52.0)
Hemoglobin: 14.2 g/dL (ref 13.0–17.0)
Immature Granulocytes: 1 %
Lymphocytes Relative: 12 %
Lymphs Abs: 0.8 10*3/uL (ref 0.7–4.0)
MCH: 32.9 pg (ref 26.0–34.0)
MCHC: 35.1 g/dL (ref 30.0–36.0)
MCV: 93.8 fL (ref 80.0–100.0)
Monocytes Absolute: 0.9 10*3/uL (ref 0.1–1.0)
Monocytes Relative: 14 %
Neutro Abs: 4.6 10*3/uL (ref 1.7–7.7)
Neutrophils Relative %: 71 %
Platelets: 228 10*3/uL (ref 150–400)
RBC: 4.32 MIL/uL (ref 4.22–5.81)
RDW: 12.8 % (ref 11.5–15.5)
WBC: 6.3 10*3/uL (ref 4.0–10.5)
nRBC: 0 % (ref 0.0–0.2)

## 2021-12-16 LAB — COMPREHENSIVE METABOLIC PANEL
ALT: 22 U/L (ref 0–44)
AST: 26 U/L (ref 15–41)
Albumin: 3.8 g/dL (ref 3.5–5.0)
Alkaline Phosphatase: 79 U/L (ref 38–126)
Anion gap: 11 (ref 5–15)
BUN: 11 mg/dL (ref 8–23)
CO2: 25 mmol/L (ref 22–32)
Calcium: 9.1 mg/dL (ref 8.9–10.3)
Chloride: 88 mmol/L — ABNORMAL LOW (ref 98–111)
Creatinine, Ser: 0.78 mg/dL (ref 0.61–1.24)
GFR, Estimated: >60 mL/min (ref >60)
Glucose, Bld: 101 mg/dL — ABNORMAL HIGH (ref 70–99)
Potassium: 4 mmol/L (ref 3.5–5.1)
Sodium: 124 mmol/L — ABNORMAL LOW (ref 135–145)
Total Bilirubin: 0.7 mg/dL (ref 0.3–1.2)
Total Protein: 6.8 g/dL (ref 6.5–8.1)

## 2021-12-16 LAB — SODIUM, URINE, RANDOM: Sodium, Ur: 182 mmol/L

## 2021-12-16 MED ORDER — CARVEDILOL 3.125 MG PO TABS
6.2500 mg | ORAL_TABLET | Freq: Two times a day (BID) | ORAL | Status: DC
  Filled 2021-12-16: qty 2

## 2021-12-16 MED ORDER — ACETAMINOPHEN 325 MG PO TABS: 650.0000 mg | ORAL_TABLET | Freq: Four times a day (QID) | ORAL | Status: DC | PRN

## 2021-12-16 MED ORDER — SODIUM CHLORIDE 0.9 % IV BOLUS
500.0000 mL | Freq: Once | INTRAVENOUS | Status: AC
  Administered 2021-12-16: 500 mL via INTRAVENOUS

## 2021-12-16 MED ORDER — ASPIRIN 81 MG PO TBEC
81.0000 mg | DELAYED_RELEASE_TABLET | Freq: Every day | ORAL | Status: DC
  Administered 2021-12-17 – 2021-12-20 (×4): 81 mg via ORAL
  Filled 2021-12-16 (×4): qty 1

## 2021-12-16 MED ORDER — LOSARTAN POTASSIUM 50 MG PO TABS
25.0000 mg | ORAL_TABLET | Freq: Every day | ORAL | Status: DC
  Filled 2021-12-16: qty 1

## 2021-12-16 MED ORDER — ATORVASTATIN CALCIUM 40 MG PO TABS
40.0000 mg | ORAL_TABLET | Freq: Every day | ORAL | Status: DC
  Administered 2021-12-17 – 2021-12-20 (×4): 40 mg via ORAL
  Filled 2021-12-16 (×4): qty 1

## 2021-12-16 NOTE — ED Triage Notes (Signed)
Pt reports he had lab work done today and it showed a sodium level of 123. He denies seizures, weakness or confusion. He was diagnosed with lung cancer on 03/2021. Denies pain. A&OX4.

## 2021-12-16 NOTE — Assessment & Plan Note (Signed)
-  S/p chemo and radiation now under observation -follows with Dr. Earlie Server oncology -questionable left sided pneumonia seen on CXR yesterday likely post-radiation scar tissue. No fever or leukocytosis. Although he reports a new cough in the past several weeks. Will hold on antibiotics and check lower respiratory procalcitonin.

## 2021-12-16 NOTE — ED Provider Triage Note (Signed)
Emergency Medicine Provider Triage Evaluation Note  Kelly Olson. , a 71 y.o. male  was evaluated in triage.  Patient was sent by his doctor due to having a low sodium, level was 123.  He states a couple of days ago it was 127.  He denies having any symptoms.  Denies seizures, weakness, confusion, lethargy.  He was diagnosed with lung cancer in March of this year.  States he recently completed radiation and chemotherapy.   Review of Systems  Positive: See above Negative: See above  Physical Exam  BP (!) 144/87 (BP Location: Left Arm)   Pulse 87   Temp 98.6 F (37 C) (Oral)   Resp 16   Ht 5\' 11"  (1.803 m)   Wt 71.7 kg   SpO2 93%   BMI 22.04 kg/m  Gen:   Awake, no distress, A&Ox4   Resp:  Normal effort  MSK:   Moves extremities without difficulty    Medical Decision Making  Medically screening exam initiated at 6:08 PM.  Appropriate orders placed.  [name]. was informed that the remainder of the evaluation will be completed by another provider, this initial triage assessment does not replace that evaluation, and the importance of remaining in the ED until their evaluation is complete.     Theressa Stamps R, [place] 12/16/21 1810

## 2021-12-16 NOTE — ED Provider Notes (Signed)
[facility] EMERGENCY DEPARTMENT Provider Note   CSN: [id number] Arrival date & time: 12/16/21  1613     History  Chief Complaint  Patient presents with   Abnormal Lab    Kelly Olson. is a 71 y.o. male.  71 year old male with past medical history that is significant for small cell lung cancer, currently in remission who presents today for evaluation of hyponatremia.  Kelly Olson was recently evaluated by his primary care provider and had blood work done which showed sodium of 127.  Kelly Olson was recommended that this will need to be repeated in a few days.  Kelly Olson had this repeated yesterday and it shows sodium of 123.  Kelly Olson was instructed to come into the emergency room.  Kelly Olson denies any significant changes to his diet, denies any swelling in his legs, shortness of breath, productive cough.  Kelly Olson states about 2 weeks ago Kelly Olson stopped taking metformin otherwise denied any changes to his medications.  Kelly Olson does state that today Kelly Olson was prescribed Augmentin and Zithromax for potential pneumonia on his x-ray which she has not started yet.  And Kelly Olson is without fever, productive cough, or any change in his shortness of breath.  Denies prior history of hyponatremia.  The history is provided by the patient. No language interpreter was used.       Home Medications Prior to Admission medications   Medication Sig Start Date End Date Taking? Authorizing Provider  acetaminophen (TYLENOL) 325 MG tablet Take 650 mg by mouth every 6 (six) hours as needed for moderate pain.    [provider]  aspirin 81 MG tablet Take 81 mg by mouth daily.    [provider]  atorvastatin (LIPITOR) 40 MG tablet TAKE 1 TABLET AT BEDTIME 09/28/21   Leonie Man, MD  carvedilol (COREG) 3.125 MG tablet TAKE 2 TABLETS TWICE A DAY WITH     MEALS 09/28/21   Leonie Man, MD  cimetidine (TAGAMET) 200 MG tablet  04/24/21   [provider]  lidocaine-prilocaine (EMLA) cream Apply to the  Port-A-Cath site 30 minutes before chemotherapy Patient not taking: Reported on 06/14/2021 05/10/21   Curt Bears, MD  losartan (COZAAR) 25 MG tablet TAKE 1 TABLET DAILY 09/28/21   Leonie Man, MD  OVER THE COUNTER MEDICATION [employer] sleep aid    [provider]  prochlorperazine (COMPAZINE) 10 MG tablet Take 1 tablet (10 mg total) by mouth every 6 (six) hours as needed for nausea or vomiting. Patient not taking: Reported on 06/14/2021 04/27/21   Curt Bears, MD  Pseudoephedrine-APAP 30-325 MG TABS  04/30/21   [provider]  sildenafil (VIAGRA) 50 MG tablet Take 1 tablet (50 mg total) by mouth daily as needed for erectile dysfunction. 02/23/16   Leonie Man, MD  sucralfate (CARAFATE) 1 g tablet Take 1 tablet (1 g total) by mouth 4 (four) times daily -  with meals and at bedtime. 5 min before meals for radiation induced esophagitis Patient not taking: Reported on 11/08/2021 05/20/21   Tyler Pita, MD      Allergies    Patient has no known allergies.    Review of Systems   Review of Systems  Constitutional:  Negative for chills and fever.  Respiratory:  Negative for shortness of breath.   Cardiovascular:  Negative for chest pain.  Gastrointestinal:  Negative for abdominal pain, diarrhea, nausea and vomiting.  Neurological:  Negative for light-headedness.  All other systems reviewed and are negative.  Physical Exam Updated Vital Signs BP (!) 155/81   Pulse 74   Temp 98.4 F (36.9 C)   Resp 16   Ht 5\' 11"  (1.803 m)   Wt 71.7 kg   SpO2 94%   BMI 22.04 kg/m  Physical Exam Vitals and nursing note reviewed.  Constitutional:      General: Kelly Olson is not in acute distress.    Appearance: Normal appearance. Kelly Olson is not ill-appearing.  HENT:     Head: Normocephalic and atraumatic.     Nose: Nose normal.  Eyes:     General: No scleral icterus.    Extraocular Movements: Extraocular movements intact.     Conjunctiva/sclera: Conjunctivae normal.   Cardiovascular:     Rate and Rhythm: Normal rate and regular rhythm.     Pulses: Normal pulses.  Pulmonary:     Effort: Pulmonary effort is normal. No respiratory distress.     Breath sounds: Normal breath sounds. No wheezing or rales.  Abdominal:     General: There is no distension.     Tenderness: There is no abdominal tenderness.  Musculoskeletal:        General: Normal range of motion.     Cervical back: Normal range of motion.     Right lower leg: No edema.     Left lower leg: No edema.  Skin:    General: Skin is warm and dry.  Neurological:     General: No focal deficit present.     Mental Status: Kelly Olson is alert. Mental status is at baseline.     ED Results / Procedures / Treatments   Labs (all labs ordered are listed, but only abnormal results are displayed) Labs Reviewed  COMPREHENSIVE METABOLIC PANEL - Abnormal; Notable for the following components:      Result Value   Sodium 124 (*)    Chloride 88 (*)    Glucose, Bld 101 (*)    All other components within normal limits  CBC WITH DIFFERENTIAL/PLATELET    EKG None  Radiology No results found.  Procedures .Critical Care  Performed by: Evlyn Courier, PA-C Authorized by: Evlyn Courier, PA-C   Critical care provider statement:    Critical care time (minutes):  30   Critical care was necessary to treat or prevent imminent or life-threatening deterioration of the following conditions:  Metabolic crisis   Critical care was time spent personally by me on the following activities:  Development of treatment plan with patient or surrogate, discussions with consultants, evaluation of patient's response to treatment, examination of patient, ordering and review of laboratory studies, ordering and review of radiographic studies, ordering and performing treatments and interventions, pulse oximetry, re-evaluation of patient's condition and review of old charts     Medications Ordered in ED Medications - No data to display  ED  Course/ Medical Decision Making/ A&P                           Medical Decision Making Risk Decision regarding hospitalization.   Medical Decision Making / ED Course   This patient presents to the ED for concern of hyponatremia, this involves an extensive number of treatment options, and is a complaint that carries with it a high risk of complications and morbidity.  The differential diagnosis includes volume loss through the GI losses, volume overload, medication change  MDM: 71 year old male with past medical history of small cell lung cancer currently in remission, diabetes presents today for evaluation  of hyponatremia.  Recently downtrending from 127-123 outpatient.  124 in the emergency room today.  Patient is asymptomatic.  No signs of volume overload.  Kelly Olson appears euvolemic on exam.  No prior history of hyponatremia.  Kelly Olson was recently given antibiotics for potential pneumonia which Kelly Olson has not started.  His chest x-ray showed left lower lobe consolidation/scarring.  Review of previous x-rays, CT scans shows consistent findings most likely scarring postradiation given Kelly Olson is afebrile, without cough, and without any shortness of breath.  We will hold off on starting antibiotics.  We will discuss with hospitalist for admission for hyponatremia.  Discussed with hospitalist will evaluate patient for admission.   Lab Tests: -I ordered, reviewed, and interpreted labs.   The pertinent results include:   Labs Reviewed  COMPREHENSIVE METABOLIC PANEL - Abnormal; Notable for the following components:      Result Value   Sodium 124 (*)    Chloride 88 (*)    Glucose, Bld 101 (*)    All other components within normal limits  CBC WITH DIFFERENTIAL/PLATELET      EKG  EKG Interpretation  Date/Time:    Ventricular Rate:    PR Interval:    QRS Duration:   QT Interval:    QTC Calculation:   R Axis:     Text Interpretation:          Medicines ordered and prescription drug  management: No orders of the defined types were placed in this encounter.   -I have reviewed the patients home medicines and have made adjustments as needed  Critical interventions Admission for hyponatremia   Reevaluation: After the interventions noted above, I reevaluated the patient and found that they have :stayed the same  Co morbidities that complicate the patient evaluation  Past Medical History:  Diagnosis Date   Coronary artery disease, occlusive 06/02/2014   Multivessel CAD.mLAD-100%, mRCA 99%, dRCA 100%, mLCX 90%  And EF 35-45%. -->  Referred for CABG; nonischemic Myoview March 2020   Former heavy tobacco smoker    Quit in April 2016    GERD (gastroesophageal reflux disease)    Hyperlipidemia with target LDL less than 70    Hypertension    Ischemic cardiomyopathy - resolved 05/2014   Myoview: EF ~33% with "infarction vs. severe resting ischemia in LAD & RCA territory; b) EF by Cath: 35-45%. 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   Stage T2 a, N0      Dispostion: Patient discussed with hospitalist will evaluate patient for admission for hyponatremia. Final Clinical Impression(s) / ED Diagnoses Final diagnoses:  Hyponatremia    Rx / DC Orders ED Discharge Orders     None         Evlyn Courier, PA-C 12/16/21 2215    Leanord Asal K, DO 12/16/21 2235

## 2021-12-16 NOTE — Assessment & Plan Note (Signed)
Continue statin. 

## 2021-12-16 NOTE — Assessment & Plan Note (Signed)
Asymptomatic from cardiac standpoint -continue home aspirin, beta-blocker

## 2021-12-16 NOTE — H&P (Signed)
History and Physical    Patient: Kelly Olson. BTD:[id number] DOB: [birth year] DOA: 12/16/2021 DOS: the patient was seen and examined on 12/16/2021 PCP: Wenda Low, MD  Patient coming from: Home  Chief Complaint:  Chief Complaint  Patient presents with   Abnormal Lab   HPI: [name]. is a 71 y.o. male with medical history significant of CAD s/p CABG, hypertension, cardiomyopathy, squamous cell carcinoma s/p right lower lobe wedge resection, unresectable small cell lung cancer s/p chemotherapy and radiation currently under observation presents with hyponatremia.  Pt had routine wellness care with PCP earlier this week and had lab work resulting with Na of 127. Asked to present today for repeat lab and sodium had decrease to 123 and advised to go to ED. He denies any symptoms of weakness, lightheadedness. No nausea or vomiting. Had an episode of diarrhea but happened today. Drinks about 48 oz water daily. No LE edema. Has had a cough for several weeks he attributes to post-nasal drainage. No fever. Had CXR yesterday concerning for left sided pneumonia vs scar tissue from previous surgery. Prescribed amoxillicin and azithromycin today and has not started.  Not on diuretics at home. Had changes to diet to increase protein and lower sugar intake.    Repeat sodium in the ED of 124, K of 4, CBG of 101.  Normal creatinine of 0.78.    CBC with WBC of 6.3, hemoglobin of 14.9 Review of Systems: As mentioned in the history of present illness. All other systems reviewed and are negative. Past Medical History:  Diagnosis Date   Coronary artery disease, occlusive 06/02/2014   Multivessel CAD.mLAD-100%, mRCA 99%, dRCA 100%, mLCX 90%  And EF 35-45%. -->  Referred for CABG; nonischemic Myoview March 2020   Former heavy tobacco smoker    Quit in April 2016    GERD (gastroesophageal reflux disease)    Hyperlipidemia with target LDL less than 70    Hypertension    Ischemic  cardiomyopathy - resolved 05/2014   Myoview: EF ~33% with "infarction vs. severe resting ischemia in LAD & RCA territory; b) EF by Cath: 35-45%. 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   Stage T2 a, N0   Past Surgical History:  Procedure Laterality Date   CARDIAC CATHETERIZATION N/A 06/02/2014   Procedure: Left Heart Cath and Coronary Angiography;  Surgeon: Leonie Man, MD;  Location: [facility] INVASIVE CV LAB CUPID;  Service: Cardiovascular;  mLAD-100%, mRCA 99%, dRCA 100%, mLCX 90%  And EF 35-45%.   CORONARY ARTERY BYPASS GRAFT N/A 06/04/2014   Procedure: CORONARY ARTERY BYPASS GRAFTING (CABG) times four using bilateral internal mammary arteries and EVH for left  leg saphenous vein;  Surgeon: Gaye Pollack, MD;  Location: [place] OR;  Service: Open Heart Surgery;  LIMA-OM, RIMA-LAD, SVG-Diag, SVG-rPDA   IR IMAGING GUIDED PORT INSERTION  05/16/2021   NM MYOVIEW LTD  05/28/2014   Pre-CABG:  High Risk Nuclear Stress Test with multivessel distribution ischemia.  -- Severely ischemic Cardiomyoapthy with evidence of at least 2 vessel disease & EF of ~33%.  Images are consistent with either infarction or severe resting ischemia in the LAD & RCA territory.   NM  MYOVIEW LTD  03/2018   EF 50-55%.  Small basal inferior fixed defect consistent with diaphragmatic attenuation.  No ischemia or infarction.  Septal dyskinesis due to post CABG state   TEE WITHOUT CARDIOVERSION N/A 06/04/2014   Procedure: TRANSESOPHAGEAL ECHOCARDIOGRAM (TEE);  Surgeon: Gaye Pollack, MD;  Location: [place];  Service: Open Heart Surgery;  Laterality: N/A;   THORACOTOMY Right 07/10/2016   Procedure: THORACOTOMY;  Surgeon: Gaye Pollack, MD;  Location: [facility] OR;  Service: Thoracic;   Laterality: Right;   THORACOTOMY/LOBECTOMY Right 07/29/2018   Procedure: THORACOTOMY/RIGHT LOWER LOBECTOMY;  Surgeon: Gaye Pollack, MD;  Location: [facility] OR;  Service: Thoracic;  Laterality: Right;   [place] ECHOCARDIOGRAM  10/2014   Mild concentric LVH. EF 50-55% with mild HK of basal anteroseptal myocardium. GR 1 DD.   VIDEO ASSISTED THORACOSCOPY (VATS)/WEDGE RESECTION Right 07/10/2016   Procedure: VIDEO ASSISTED THORACOSCOPY (VATS)/WEDGE RESECTION;  Surgeon: Gaye Pollack, MD;  Location: [place] OR;  Service: Thoracic;  Laterality: Right;   VIDEO BRONCHOSCOPY WITH ENDOBRONCHIAL NAVIGATION N/A 04/13/2021   Procedure: VIDEO BRONCHOSCOPY WITH ENDOBRONCHIAL NAVIGATION;  Surgeon: Melrose Nakayama, MD;  Location: [place];  Service: Thoracic;  Laterality: N/A;   Social History:  reports that he quit smoking about 7 years ago. His smoking use included cigarettes. He has a 36.80 pack-year smoking history. He has never used smokeless tobacco. He reports current alcohol use of about 7.0 standard drinks of alcohol per week. He reports that he does not use drugs.  No Known Allergies  Family History  Problem Relation Age of Onset   Emphysema Mother    Cardiomyopathy Father    Healthy Sister    Heart attack Maternal Grandfather    Healthy Sister    Heart disease Paternal Uncle     Prior to Admission medications   Medication Sig Start Date End Date Taking? Authorizing Provider  acetaminophen (TYLENOL) 325 MG tablet Take 650 mg by mouth every 6 (six) hours as needed for moderate pain.   Yes [provider]  albuterol (VENTOLIN HFA) 108 (90 Base) MCG/ACT inhaler Inhale 2 puffs into the lungs every 6 (six) hours as needed. 12/08/21  Yes [provider]  amoxicillin-clavulanate (AUGMENTIN) 875-125 MG tablet Take 1 tablet by mouth 2 (two) times daily. 12/16/21  Yes [provider]  aspirin 81 MG tablet Take 81 mg by mouth daily.   Yes [provider]  atorvastatin (LIPITOR) 40 MG tablet TAKE 1 TABLET AT BEDTIME Patient taking differently: Take 40 mg by mouth daily. 09/28/21  Yes Leonie Man, MD  azithromycin (ZITHROMAX) 250 MG tablet Take 250 mg by mouth as directed. 12/16/21  Yes [provider]  carvedilol (COREG) 3.125 MG tablet TAKE 2 TABLETS TWICE A DAY WITH     MEALS Patient taking differently: Take 6.25 mg by mouth 2 (two) times daily with a meal. 09/28/21  Yes Leonie Man, MD  losartan (COZAAR) 25 MG tablet TAKE 1 TABLET DAILY 09/28/21  Yes Leonie Man, MD  metFORMIN (GLUCOPHAGE-XR) 500 MG 24 hr tablet Take 500 mg by mouth 2 (two) times daily. 12/08/21  Yes [provider]  Pseudoephedrine-APAP 30-325 MG TABS Take 1 tablet by mouth daily as needed (sinus congestion). 04/30/21  Yes [provider]  sildenafil (VIAGRA) 50 MG tablet Take 1 tablet (50 mg total) by mouth daily as needed for erectile dysfunction. 02/23/16  Yes Leonie Man, MD  lidocaine-prilocaine (EMLA) cream Apply to  the Port-A-Cath site 30 minutes before chemotherapy Patient not taking: Reported on 06/14/2021 05/10/21   Curt Bears, MD  prochlorperazine (COMPAZINE) 10 MG tablet Take 1 tablet (10 mg total) by mouth every 6 (six) hours as needed for nausea or vomiting. Patient not taking: Reported on 06/14/2021 04/27/21   Curt Bears, MD  sucralfate (CARAFATE) 1 g tablet Take 1 tablet (1 g total) by mouth 4 (four) times daily -  with meals and at bedtime. 5 min before meals for radiation induced esophagitis Patient not taking: Reported on 11/08/2021 05/20/21   Tyler Pita, MD    Physical Exam: Vitals:   12/16/21 1750 12/16/21 1751 12/16/21 1937 12/16/21 2030  BP:  (!) 144/87 (!) 155/81 (!) 166/89  Pulse:  87 74 77  Resp:  16 16 18   Temp:  98.6 F (37 C) 98.4 F (36.9 C)   TempSrc:  Oral    SpO2:  93% 94% 98%  Weight: 71.7 kg     Height: 5\' 11"  (1.803 m)      Constitutional: NAD, calm, comfortable,  well appearing elderly male laying in bed Eyes:  lids and conjunctivae normal ENMT: Mucous membranes are moist.  Neck: normal, supple Respiratory: clear to auscultation bilaterally, no wheezing, no crackles. Normal respiratory effort. No accessory muscle use.  Cardiovascular: Regular rate and rhythm, no murmurs / rubs / gallops. No extremity edema.  Abdomen: soft, no tenderness, Bowel sounds positive.  Musculoskeletal: no clubbing / cyanosis. No joint deformity upper and lower extremities. Good ROM, no contractures. Normal muscle tone.  Skin: no rashes, lesions, ulcers. No induration Neurologic: CN 2-12 grossly intact. Strength 5/5 in all 4.  Psychiatric: Normal judgment and insight. Alert and oriented x 3. Normal mood. Data Reviewed:  See HPI  Assessment and Plan: * Hyponatremia -No obvious etiology. Pt appears euvolemic on exam without any symptoms of GI loss -will test urine sodium, osm -trial a small 500cc bolus NS and repeat Na. This is based on the fact that his hgb appears more concentrated than prior.   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 Earlie Server oncology -questionable left sided pneumonia seen on CXR yesterday likely post-radiation scar tissue. No fever or leukocytosis. Although he reports a new cough in the past several weeks. Will hold on antibiotics and check lower respiratory procalcitonin.   Hyperlipidemia with target LDL less than 70 Continue statin      Advance Care Planning:   Code Status: Prior Full  Consults: none  Family Communication: none at bedside  Severity of Illness: The appropriate patient status for this patient is OBSERVATION. Observation status is judged to be reasonable and necessary in order to provide the required intensity of service  to ensure the patient's safety. The patient's presenting symptoms, physical exam findings, and initial radiographic and laboratory data in the context of their medical condition is felt to place them at decreased risk for further clinical deterioration. Furthermore, it is anticipated that the patient will be medically stable for discharge from the hospital within 2 midnights of admission.   Author: Orene Desanctis, DO 12/16/2021 11:56 PM  For on call review [email or web address].

## 2021-12-16 NOTE — Assessment & Plan Note (Signed)
-  No obvious etiology. Pt appears euvolemic on exam without any symptoms of GI loss -will test urine sodium, osm -trial a small 500cc bolus NS and repeat Na. This is based on the fact that his hgb appears more concentrated than prior.

## 2021-12-16 NOTE — Assessment & Plan Note (Signed)
-   Continue amlodipine ?

## 2021-12-16 NOTE — ED Notes (Signed)
THE PT REPORTS THAT HE WAS SENT HERE- FOR ABNORMAL LABS   NO PAIN AND O X4  WIFE AT THE BEDSIDE

## 2021-12-17 DIAGNOSIS — E871 Hypo-osmolality and hyponatremia: Secondary | ICD-10-CM | POA: Diagnosis present

## 2021-12-17 DIAGNOSIS — Z85118 Personal history of other malignant neoplasm of bronchus and lung: Secondary | ICD-10-CM | POA: Diagnosis not present

## 2021-12-17 DIAGNOSIS — E875 Hyperkalemia: Secondary | ICD-10-CM | POA: Diagnosis present

## 2021-12-17 DIAGNOSIS — E222 Syndrome of inappropriate secretion of antidiuretic hormone: Secondary | ICD-10-CM | POA: Diagnosis present

## 2021-12-17 DIAGNOSIS — R197 Diarrhea, unspecified: Secondary | ICD-10-CM | POA: Diagnosis present

## 2021-12-17 DIAGNOSIS — Z87891 Personal history of nicotine dependence: Secondary | ICD-10-CM | POA: Diagnosis not present

## 2021-12-17 DIAGNOSIS — C3492 Malignant neoplasm of unspecified part of left bronchus or lung: Secondary | ICD-10-CM | POA: Diagnosis not present

## 2021-12-17 DIAGNOSIS — Z79899 Other long term (current) drug therapy: Secondary | ICD-10-CM | POA: Diagnosis not present

## 2021-12-17 DIAGNOSIS — Z902 Acquired absence of lung [part of]: Secondary | ICD-10-CM | POA: Diagnosis not present

## 2021-12-17 DIAGNOSIS — Z951 Presence of aortocoronary bypass graft: Secondary | ICD-10-CM | POA: Diagnosis not present

## 2021-12-17 DIAGNOSIS — I255 Ischemic cardiomyopathy: Secondary | ICD-10-CM | POA: Diagnosis present

## 2021-12-17 DIAGNOSIS — K219 Gastro-esophageal reflux disease without esophagitis: Secondary | ICD-10-CM | POA: Diagnosis present

## 2021-12-17 DIAGNOSIS — Z825 Family history of asthma and other chronic lower respiratory diseases: Secondary | ICD-10-CM | POA: Diagnosis not present

## 2021-12-17 DIAGNOSIS — E785 Hyperlipidemia, unspecified: Secondary | ICD-10-CM | POA: Diagnosis present

## 2021-12-17 DIAGNOSIS — Z7982 Long term (current) use of aspirin: Secondary | ICD-10-CM | POA: Diagnosis not present

## 2021-12-17 DIAGNOSIS — E872 Acidosis, unspecified: Secondary | ICD-10-CM | POA: Diagnosis present

## 2021-12-17 DIAGNOSIS — Z8249 Family history of ischemic heart disease and other diseases of the circulatory system: Secondary | ICD-10-CM | POA: Diagnosis not present

## 2021-12-17 DIAGNOSIS — Z923 Personal history of irradiation: Secondary | ICD-10-CM | POA: Diagnosis not present

## 2021-12-17 DIAGNOSIS — I5032 Chronic diastolic (congestive) heart failure: Secondary | ICD-10-CM | POA: Diagnosis present

## 2021-12-17 DIAGNOSIS — I110 Hypertensive heart disease with heart failure: Secondary | ICD-10-CM | POA: Diagnosis present

## 2021-12-17 DIAGNOSIS — I10 Essential (primary) hypertension: Secondary | ICD-10-CM | POA: Diagnosis not present

## 2021-12-17 DIAGNOSIS — E119 Type 2 diabetes mellitus without complications: Secondary | ICD-10-CM | POA: Diagnosis present

## 2021-12-17 DIAGNOSIS — Z9221 Personal history of antineoplastic chemotherapy: Secondary | ICD-10-CM | POA: Diagnosis not present

## 2021-12-17 DIAGNOSIS — I2510 Atherosclerotic heart disease of native coronary artery without angina pectoris: Secondary | ICD-10-CM | POA: Diagnosis present

## 2021-12-17 LAB — BASIC METABOLIC PANEL
Anion gap: 11 (ref 5–15)
Anion gap: 10 (ref 5–15)
Anion gap: 16 — ABNORMAL HIGH (ref 5–15)
BUN: 10 mg/dL (ref 8–23)
BUN: 8 mg/dL (ref 8–23)
BUN: 10 mg/dL (ref 8–23)
CO2: 23 mmol/L (ref 22–32)
CO2: 23 mmol/L (ref 22–32)
CO2: 18 mmol/L — ABNORMAL LOW (ref 22–32)
Calcium: 8.4 mg/dL — ABNORMAL LOW (ref 8.9–10.3)
Calcium: 8.7 mg/dL — ABNORMAL LOW (ref 8.9–10.3)
Calcium: 8.9 mg/dL (ref 8.9–10.3)
Chloride: 89 mmol/L — ABNORMAL LOW (ref 98–111)
Chloride: 89 mmol/L — ABNORMAL LOW (ref 98–111)
Chloride: 88 mmol/L — ABNORMAL LOW (ref 98–111)
Creatinine, Ser: 0.67 mg/dL (ref 0.61–1.24)
Creatinine, Ser: 0.66 mg/dL (ref 0.61–1.24)
Creatinine, Ser: 0.68 mg/dL (ref 0.61–1.24)
GFR, Estimated: >60 mL/min (ref >60)
GFR, Estimated: >60 mL/min (ref >60)
GFR, Estimated: >60 mL/min (ref >60)
Glucose, Bld: 82 mg/dL (ref 70–99)
Glucose, Bld: 91 mg/dL (ref 70–99)
Glucose, Bld: 102 mg/dL — ABNORMAL HIGH (ref 70–99)
Potassium: 3.7 mmol/L (ref 3.5–5.1)
Potassium: 3.6 mmol/L (ref 3.5–5.1)
Potassium: 5.4 mmol/L — ABNORMAL HIGH (ref 3.5–5.1)
Sodium: 123 mmol/L — ABNORMAL LOW (ref 135–145)
Sodium: 122 mmol/L — ABNORMAL LOW (ref 135–145)
Sodium: 122 mmol/L — ABNORMAL LOW (ref 135–145)

## 2021-12-17 LAB — CORTISOL: Cortisol, Plasma: 16.3 ug/dL

## 2021-12-17 LAB — TSH: TSH: 1.164 u[IU]/mL (ref 0.350–4.500)

## 2021-12-17 LAB — OSMOLALITY, URINE: Osmolality, Ur: 754 mOsm/kg (ref 300–900)

## 2021-12-17 LAB — PROCALCITONIN: Procalcitonin: <0.1 ng/mL

## 2021-12-17 MED ORDER — SODIUM BICARBONATE 650 MG PO TABS
650.0000 mg | ORAL_TABLET | Freq: Two times a day (BID) | ORAL | Status: AC
  Administered 2021-12-17 – 2021-12-18 (×2): 650 mg via ORAL
  Filled 2021-12-17 (×2): qty 1

## 2021-12-17 MED ORDER — SODIUM POLYSTYRENE SULFONATE 15 GM/60ML PO SUSP
15.0000 g | Freq: Once | ORAL | Status: AC
  Administered 2021-12-17: 15 g via ORAL
  Filled 2021-12-17: qty 60

## 2021-12-17 MED ORDER — CARVEDILOL 3.125 MG PO TABS: 3.1250 mg | ORAL_TABLET | Freq: Two times a day (BID) | ORAL | Status: DC

## 2021-12-17 MED ORDER — ENOXAPARIN SODIUM 40 MG/0.4ML IJ SOSY
40.0000 mg | PREFILLED_SYRINGE | Freq: Every day | INTRAMUSCULAR | Status: DC
  Administered 2021-12-17 – 2021-12-20 (×4): 40 mg via SUBCUTANEOUS
  Filled 2021-12-17 (×4): qty 0.4

## 2021-12-17 NOTE — ED Notes (Signed)
ED TO INPATIENT HANDOFF REPORT  ED Nurse Name and Phone #:   S Name/Age/Gender Kelly Olson. 71 y.o. male Room/Bed: 003C/003C  Code Status   Code Status: Full Code  Home/SNF/Other Home Patient oriented to: self, place, time, and situation Is this baseline? Yes   Triage Complete: Triage complete  Chief Complaint Hyponatremia [E87.1]  Triage Note Pt reports he had lab work done today and it showed a sodium level of 123. He denies seizures, weakness or confusion. He was diagnosed with lung cancer on 03/2021. Denies pain. A&OX4.   Allergies No Known Allergies  Level of Care/Admitting Diagnosis ED Disposition     ED Disposition  Admit   Condition  --   Comment  Hospital Area: [facility] [[id number]]  Level of Care: Telemetry Medical [104]  May place patient in observation at [facility] or [place] if equivalent level of care is available:: No  Covid Evaluation: Asymptomatic - no recent exposure (last 10 days) testing not required  Diagnosis: Hyponatremia [937169]  Admitting Physician: Orene Desanctis [[id number]]  Attending Physician: Orene Desanctis [[id number]]          B Medical/Surgery History Past Medical History:  Diagnosis Date   Coronary artery disease, occlusive 06/02/2014   Multivessel CAD.mLAD-100%, mRCA 99%, dRCA 100%, mLCX 90%  And EF 35-45%. -->  Referred for CABG; nonischemic Myoview March 2020   Former heavy tobacco smoker    Quit in April 2016    GERD (gastroesophageal reflux disease)    Hyperlipidemia with target LDL less than 70    Hypertension    Ischemic cardiomyopathy - resolved 05/2014   Myoview: EF ~33% with "infarction vs. severe resting ischemia in LAD & RCA territory; b) EF by Cath: 35-45%. 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   Stage T2 a, N0   Past Surgical History:  Procedure Laterality Date   CARDIAC CATHETERIZATION N/A 06/02/2014   Procedure: Left Heart Cath and Coronary Angiography;  Surgeon: Leonie Man, MD;  Location: [facility] INVASIVE CV LAB CUPID;  Service: Cardiovascular;  mLAD-100%, mRCA 99%, dRCA 100%, mLCX 90%  And EF 35-45%.   CORONARY ARTERY BYPASS GRAFT N/A 06/04/2014   Procedure: CORONARY ARTERY BYPASS GRAFTING (CABG) times four using bilateral internal mammary arteries and EVH for left  leg saphenous vein;  Surgeon: Gaye Pollack, MD;  Location: [facility] OR;  Service: Open Heart Surgery;  LIMA-OM, RIMA-LAD, SVG-Diag, SVG-rPDA   IR IMAGING GUIDED PORT INSERTION  05/16/2021   NM MYOVIEW LTD  05/28/2014   Pre-CABG:  High Risk Nuclear Stress Test with multivessel distribution ischemia.  -- Severely ischemic Cardiomyoapthy with evidence of at least 2 vessel disease & EF of ~33%.  Images are consistent with either infarction or severe resting ischemia in the LAD & RCA territory.   NM MYOVIEW LTD  03/2018   EF 50-55%.  Small basal inferior fixed defect consistent with diaphragmatic attenuation.  No ischemia or infarction.  Septal dyskinesis due to post CABG state   TEE WITHOUT CARDIOVERSION N/A 06/04/2014   Procedure: TRANSESOPHAGEAL ECHOCARDIOGRAM (TEE);  Surgeon: Gaye Pollack, MD;  Location: [facility];  Service: Open Heart Surgery;  Laterality:  N/A;   THORACOTOMY Right 07/10/2016   Procedure: THORACOTOMY;  Surgeon: Gaye Pollack, MD;  Location[facility] OR;  Service: Thoracic;  Laterality: Right;   THORACOTOMY/LOBECTOMY Right 07/29/2018   Procedure: THORACOTOMY/RIGHT LOWER LOBECTOMY;  Surgeon: Gaye Pollack, MD;  Location: [facility] OR;  Service: Thoracic;  Laterality: Right;   Carl ECHOCARDIOGRAM  10/2014   Mild concentric LVH. EF 50-55% with mild HK of basal anteroseptal myocardium. GR 1 DD.   VIDEO ASSISTED  THORACOSCOPY (VATS)/WEDGE RESECTION Right 07/10/2016   Procedure: VIDEO ASSISTED THORACOSCOPY (VATS)/WEDGE RESECTION;  Surgeon: Gaye Pollack, MD;  Location: [facility] OR;  Service: Thoracic;  Laterality: Right;   VIDEO BRONCHOSCOPY WITH ENDOBRONCHIAL NAVIGATION N/A 04/13/2021   Procedure: VIDEO BRONCHOSCOPY WITH ENDOBRONCHIAL NAVIGATION;  Surgeon: Melrose Nakayama, MD;  Location: [place];  Service: Thoracic;  Laterality: N/A;     A IV Location/Drains/Wounds Patient Lines/Drains/Airways Status     Active Line/Drains/Airways     Name Placement date Placement time Site Days   Implanted Port 05/16/21 Right Chest 05/16/21  1517  Chest  215   Peripheral IV 12/16/21 20 G Anterior;Distal;Left;Upper Arm 12/16/21  2245  Arm  1   Chest Tube 1 Left;Medial Pleural 14 Fr. 04/13/21  1627  Pleural  248   Incision (Closed) 04/13/21 Chest Left;Upper 04/13/21  1930  -- 248            Intake/Output Last 24 hours  Intake/Output Summary (Last 24 hours) at 12/17/2021 1244 Last data filed at 12/17/2021 0734 Gross per 24 hour  Intake 500 ml  Output --  Net 500 ml    Labs/Imaging Results for orders placed or performed during the hospital encounter of 12/16/21 (from the past 48 hour(s))  CBC with Differential     Status: None   Collection Time: 12/16/21  5:51 PM  Result Value Ref Range   WBC 6.3 4.0 - 10.5 K/uL   RBC 4.32 4.22 - 5.81 MIL/uL   Hemoglobin 14.2 13.0 - 17.0 g/dL   HCT 40.5 39.0 - 52.0 %   MCV 93.8 80.0 - 100.0 fL   MCH 32.9 26.0 - 34.0 pg   MCHC 35.1 30.0 - 36.0 g/dL   RDW 12.8 11.5 - 15.5 %   Platelets 228 150 - 400 K/uL   nRBC 0.0 0.0 - 0.2 %   Neutrophils Relative % 71 %   Neutro Abs 4.6 1.7 - 7.7 K/uL   Lymphocytes Relative 12 %   Lymphs Abs 0.8 0.7 - 4.0 K/uL   Monocytes Relative 14 %   Monocytes Absolute 0.9 0.1 - 1.0 K/uL   Eosinophils Relative 1 %   Eosinophils Absolute 0.1 0.0 - 0.5 K/uL   Basophils Relative 1 %   Basophils Absolute 0.1 0.0 - 0.1 K/uL   Immature  Granulocytes 1 %   Abs Immature Granulocytes 0.03 0.00 - 0.07 K/uL    Comment: Performed at [facility], [address] [place], [place] [address]  Comprehensive metabolic panel     Status: Abnormal   Collection Time: 12/16/21  5:51 PM  Result Value Ref Range   Sodium 124 (L) 135 - 145 mmol/L   Potassium 4.0 3.5 - 5.1 mmol/L   Chloride 88 (L) 98 - 111 mmol/L   CO2 25 22 - 32 mmol/L   Glucose, Bld 101 (H) 70 - 99 mg/dL    Comment: Glucose reference range applies only to samples taken after fasting for at least 8 hours.  BUN 11 8 - 23 mg/dL   Creatinine, Ser 0.78 0.61 - 1.24 mg/dL   Calcium 9.1 8.9 - 10.3 mg/dL   Total Protein 6.8 6.5 - 8.1 g/dL   Albumin 3.8 3.5 - 5.0 g/dL   AST 26 15 - 41 U/L   ALT 22 0 - 44 U/L   Alkaline Phosphatase 79 38 - 126 U/L   Total Bilirubin 0.7 0.3 - 1.2 mg/dL   GFR, Estimated >60 >60 mL/min    Comment: (NOTE) Calculated using the CKD-EPI Creatinine Equation (2021)    Anion gap 11 5 - 15    Comment: Performed at [facility] [address], [place], [place] [address]  Sodium, urine, random     Status: None   Collection Time: 12/16/21 10:30 PM  Result Value Ref Range   Sodium, Ur 182 mmol/L    Comment: Performed at [facility] [address], [place], [place] [address]  Osmolality, urine     Status: None   Collection Time: 12/16/21 10:30 PM  Result Value Ref Range   Osmolality, Ur 754 300 - 900 mOsm/kg    Comment: REPEATED TO VERIFY Performed at [facility], [address] [place], [place] [address]   Basic metabolic panel     Status: Abnormal   Collection Time: 12/17/21 12:30 AM  Result Value Ref Range   Sodium 123 (L) 135 - 145 mmol/L   Potassium 3.7 3.5 - 5.1 mmol/L   Chloride 89 (L) 98 - 111 mmol/L   CO2 23 22 - 32 mmol/L   Glucose, Bld 82 70 - 99 mg/dL    Comment: Glucose reference range applies only to samples taken after fasting for at least 8 hours.   BUN 10 8 - 23 mg/dL   Creatinine, Ser 0.67 0.61  - 1.24 mg/dL   Calcium 8.4 (L) 8.9 - 10.3 mg/dL   GFR, Estimated >60 >60 mL/min    Comment: (NOTE) Calculated using the CKD-EPI Creatinine Equation (2021)    Anion gap 11 5 - 15    Comment: Performed at [facility] [address] [place], [place] [address]  Procalcitonin - Baseline     Status: None   Collection Time: 12/17/21 12:30 AM  Result Value Ref Range   Procalcitonin <0.10 ng/mL    Comment:        Interpretation: PCT (Procalcitonin) <= 0.5 ng/mL: Systemic infection (sepsis) is not likely. Local bacterial infection is possible. (NOTE)       Sepsis PCT Algorithm           Lower Respiratory Tract                                      Infection PCT Algorithm    ----------------------------     ----------------------------         PCT < 0.25 ng/mL                PCT < 0.10 ng/mL          Strongly encourage             Strongly discourage   discontinuation of antibiotics    initiation of antibiotics    ----------------------------     -----------------------------       PCT 0.25 - 0.50 ng/mL            PCT 0.10 - 0.25 ng/mL  OR       >80% decrease in PCT            Discourage initiation of                                            antibiotics      Encourage discontinuation           of antibiotics    ----------------------------     -----------------------------         PCT >= 0.50 ng/mL              PCT 0.26 - 0.50 ng/mL               AND        <80% decrease in PCT             Encourage initiation of                                             antibiotics       Encourage continuation           of antibiotics    ----------------------------     -----------------------------        PCT >= 0.50 ng/mL                  PCT > 0.50 ng/mL               AND         increase in PCT                  Strongly encourage                                      initiation of antibiotics    Strongly encourage escalation           of antibiotics                                      -----------------------------                                           PCT <= 0.25 ng/mL                                                 OR                                        > 80% decrease in PCT                                      Discontinue / Do not initiate  antibiotics  Performed at [facility], [place] [address] [place], [place] [address]   Basic metabolic panel     Status: Abnormal   Collection Time: 12/17/21  5:20 AM  Result Value Ref Range   Sodium 122 (L) 135 - 145 mmol/L   Potassium 3.6 3.5 - 5.1 mmol/L   Chloride 89 (L) 98 - 111 mmol/L   CO2 23 22 - 32 mmol/L   Glucose, Bld 91 70 - 99 mg/dL    Comment: Glucose reference range applies only to samples taken after fasting for at least 8 hours.   BUN 8 8 - 23 mg/dL   Creatinine, Ser 0.66 0.61 - 1.24 mg/dL   Calcium 8.7 (L) 8.9 - 10.3 mg/dL   GFR, Estimated >60 >60 mL/min    Comment: (NOTE) Calculated using the CKD-EPI Creatinine Equation (2021)    Anion gap 10 5 - 15    Comment: Performed at [facility] [address] [place], [place] [address]  TSH     Status: None   Collection Time: 12/17/21  5:20 AM  Result Value Ref Range   TSH 1.164 0.350 - 4.500 uIU/mL    Comment: Performed by a 3rd Generation assay with a functional sensitivity of <=0.01 uIU/mL. Performed at [facility], [place] [address] [place], [place] [address]   Cortisol     Status: None   Collection Time: 12/17/21  5:20 AM  Result Value Ref Range   Cortisol, Plasma 16.3 ug/dL    Comment: (NOTE) AM    6.7 - 22.6 ug/dL PM   <10.0       ug/dL Performed at [facility] [address] [place],  [address]    No results found.  Pending Labs Unresulted Labs (From admission, onward)    None       Vitals/Pain Today's Vitals   12/17/21 0900 12/17/21 0930 12/17/21 1100 12/17/21 1209  BP: 128/82  (!) 146/82   Pulse: 74  (!) 59   Resp: 19  13   Temp:     97.9 F (36.6 C)  TempSrc:    Oral  SpO2: 96%  98%   Weight:      Height:      PainSc:  0-No pain      Isolation Precautions No active isolations  Medications Medications  acetaminophen (TYLENOL) tablet 650 mg (has no administration in time range)  aspirin EC tablet 81 mg (81 mg Oral Given 12/17/21 0929)  atorvastatin (LIPITOR) tablet 40 mg (40 mg Oral Given 12/17/21 0929)  enoxaparin (LOVENOX) injection 40 mg (40 mg Subcutaneous Given 12/17/21 0929)  sodium chloride 0.9 % bolus 500 mL (0 mLs Intravenous Stopped 12/17/21 0019)    Mobility walks Low fall risk   Focused Assessments    R Recommendations: See Admitting Provider Note  Report given to:   Additional Notes:

## 2021-12-17 NOTE — Progress Notes (Signed)
[facility] PROGRESS NOTE    Progress Note  Kelly Olson.  MPN:[id number] DOB: July 11, 1950 DOA: 12/16/2021 PCP: Wenda Low, MD     Brief Narrative:   Kelly Night Brylan Dec. is an 71 y.o. 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  With a small bolus challenge sodium Worst. He is currently asymptomatic. Urine sodium is greater than 300, urinary sodium is 185 Check a TSH and cortisol, blood glucose is 90s We will fluid restrict him, check strict I's and O's.  History of CABG: Continue aspirin and beta-blockers.  Essential hypertension: Continue amlodipine.  Squamous cell carcinoma on chemotherapy and radiation: now under observation by Dr. Earlie Server.  Hyperlipidemia: Continue statins.     DVT prophylaxis: lovenox Family Communication:none Status is: Observation The patient remains OBS appropriate and will d/c before 2 midnights.  Sodium continues to drop.    Code Status:     Code Status Orders  (From admission, onward)           Start     Ordered   12/17/21 0000  Full code  Continuous        12/17/21 0000           Code Status History     Date Active Date Inactive Code Status Order ID Comments User Context   07/29/2018 1242 08/02/2018 1327 Full Code [id number]  Gaye Pollack, MD Inpatient   07/10/2016 1303 07/26/2016 1833 Full Code [id number]  Elgie Collard, PA-C Inpatient   06/04/2014 1305 06/08/2014 1458 Full Code [id number]  Nani Skillern, PA-C Inpatient   06/02/2014 0841 06/04/2014 1305 Full Code [id number]  Leonie Man, MD Inpatient         IV  Access:   Peripheral IV   Procedures and diagnostic studies:   No results found.   Medical Consultants:   None.   Subjective:    Kelly Olson. no complaints this morning.  Objective:    Vitals:   12/17/21 0330 12/17/21 0400 12/17/21 0430 12/17/21 0500  BP: 113/76 125/80 (!) 156/80 (!) 153/86  Pulse: (!) 59 (!) 59 66 68  Resp:    16  Temp:      TempSrc:      SpO2: 93% 94% 93% 94%  Weight:      Height:       SpO2: 94 %  No intake or output data in the 24 hours ending 12/17/21 0706 Filed Weights   12/16/21 1750  Weight: 71.7 kg    Exam: General exam: In no acute distress. Respiratory system: Good air movement and clear to auscultation. Cardiovascular system: S1 & S2 heard, RRR. No JVD. Gastrointestinal system: Abdomen is nondistended, soft and nontender.  Extremities: No pedal edema. Skin: No rashes, lesions or ulcers Psychiatry: Judgement and insight appear normal. Mood & affect appropriate.    Data Reviewed:    Labs: Basic Metabolic Panel: Recent Labs  Lab 12/16/21 1751 12/17/21 0030 12/17/21 0520  NA 124* 123* 122*  K 4.0 3.7 3.6  CL 88* 89* 89*  CO2 25 23 23   GLUCOSE 101* 82 91  BUN 11 10  8  CREATININE 0.78 0.67 0.66  CALCIUM 9.1 8.4* 8.7*   GFR Estimated Creatinine Clearance: 85.9 mL/min (by C-G formula based on SCr of 0.66 mg/dL). Liver Function Tests: Recent Labs  Lab 12/16/21 1751  AST 26  ALT 22  ALKPHOS 79  BILITOT 0.7  PROT 6.8  ALBUMIN 3.8   No results for input(s): "LIPASE", "AMYLASE" in the last 168 hours. No results for input(s): "AMMONIA" in the last 168 hours. Coagulation profile No results for input(s): "INR", "PROTIME" in the last 168 hours. COVID-19 Labs  No results for input(s): "DDIMER", "FERRITIN", "LDH", "CRP" in the last 72 hours.  Lab Results  Component Value Date   SARSCOV2NAA NEGATIVE 07/27/2018   [place] NEGATIVE 07/24/2018    CBC: Recent Labs  Lab 12/16/21 1751  WBC 6.3   NEUTROABS 4.6  HGB 14.2  HCT 40.5  MCV 93.8  PLT 228   Cardiac Enzymes: No results for input(s): "CKTOTAL", "CKMB", "CKMBINDEX", "TROPONINI" in the last 168 hours. BNP (last 3 results) No results for input(s): "PROBNP" in the last 8760 hours. CBG: No results for input(s): "GLUCAP" in the last 168 hours. D-Dimer: No results for input(s): "DDIMER" in the last 72 hours. Hgb A1c: No results for input(s): "HGBA1C" in the last 72 hours. Lipid Profile: No results for input(s): "CHOL", "HDL", "LDLCALC", "TRIG", "CHOLHDL", "LDLDIRECT" in the last 72 hours. Thyroid function studies: No results for input(s): "TSH", "T4TOTAL", "T3FREE", "THYROIDAB" in the last 72 hours.  Invalid input(s): "FREET3" Anemia work up: No results for input(s): "VITAMINB12", "FOLATE", "FERRITIN", "TIBC", "IRON", "RETICCTPCT" in the last 72 hours. Sepsis Labs: Recent Labs  Lab 12/16/21 1751 12/17/21 0030  PROCALCITON  --  <0.10  WBC 6.3  --    Microbiology No results found for this or any previous visit (from the past 240 hour(s)).   Medications:    aspirin EC  81 mg Oral Daily   atorvastatin  40 mg Oral Daily   carvedilol  6.25 mg Oral BID WC   enoxaparin (LOVENOX) injection  40 mg Subcutaneous Daily   losartan  25 mg Oral Daily   Continuous Infusions:    LOS: 0 days   Charlynne Cousins  [facility]  12/17/2021, 7:06 AM

## 2021-12-17 NOTE — ED Notes (Signed)
Dr. Olevia Bowens notified of orthostatics

## 2021-12-17 NOTE — ED Notes (Signed)
Pt ambulatory to and from restroom with steady gait 

## 2021-12-18 DIAGNOSIS — C3492 Malignant neoplasm of unspecified part of left bronchus or lung: Secondary | ICD-10-CM | POA: Diagnosis not present

## 2021-12-18 DIAGNOSIS — E871 Hypo-osmolality and hyponatremia: Secondary | ICD-10-CM | POA: Diagnosis not present

## 2021-12-18 DIAGNOSIS — I10 Essential (primary) hypertension: Secondary | ICD-10-CM | POA: Diagnosis not present

## 2021-12-18 LAB — BASIC METABOLIC PANEL
Anion gap: 10 (ref 5–15)
Anion gap: 12 (ref 5–15)
BUN: 10 mg/dL (ref 8–23)
BUN: 12 mg/dL (ref 8–23)
CO2: 26 mmol/L (ref 22–32)
CO2: 23 mmol/L (ref 22–32)
Calcium: 8.5 mg/dL — ABNORMAL LOW (ref 8.9–10.3)
Calcium: 9 mg/dL (ref 8.9–10.3)
Chloride: 86 mmol/L — ABNORMAL LOW (ref 98–111)
Chloride: 85 mmol/L — ABNORMAL LOW (ref 98–111)
Creatinine, Ser: 0.82 mg/dL (ref 0.61–1.24)
Creatinine, Ser: 0.81 mg/dL (ref 0.61–1.24)
GFR, Estimated: >60 mL/min (ref >60)
GFR, Estimated: >60 mL/min (ref >60)
Glucose, Bld: 89 mg/dL (ref 70–99)
Glucose, Bld: 120 mg/dL — ABNORMAL HIGH (ref 70–99)
Potassium: 4.1 mmol/L (ref 3.5–5.1)
Potassium: 3.9 mmol/L (ref 3.5–5.1)
Sodium: 122 mmol/L — ABNORMAL LOW (ref 135–145)
Sodium: 120 mmol/L — ABNORMAL LOW (ref 135–145)

## 2021-12-18 MED ORDER — TOLVAPTAN 15 MG PO TABS
15.0000 mg | ORAL_TABLET | Freq: Once | ORAL | Status: AC
  Administered 2021-12-18: 15 mg via ORAL
  Filled 2021-12-18: qty 1

## 2021-12-18 MED ORDER — CARVEDILOL 6.25 MG PO TABS
6.2500 mg | ORAL_TABLET | Freq: Two times a day (BID) | ORAL | Status: DC
  Administered 2021-12-18 – 2021-12-19 (×3): 6.25 mg via ORAL
  Filled 2021-12-18 (×4): qty 1

## 2021-12-18 NOTE — Consult Note (Signed)
Renal Service Consult Note [facility]  [facility] [place]. 12/18/2021 Sol Blazing, MD Requesting Physician: Dr. Aileen Fass  Reason for Consult: Hyponatremia HPI: The patient is a 71 y.o. year-old w/ hx as below who presented to ED on 11/17 sent by PCP due to abnormal labs w/ low Na+.  Not taking any diuretics, is on an ARB. Made recent changes to his diet to help with BS control.  In ED Na+ was 124. Pt was admitted. UNa 183 and UOsm 754.  Since admission the Na+ has slowly dropped down to 122 this am (admitted x 36 hrs).  We are asked to see for hyponatremia.   Pt states in 2018 he had NSCCa of the R lung and had wedge resection. Then in 2020 had recurrence in RLL and and RLLobectomy, also NSCCa of lung cancer. Then in 03/2021 his q 6 mo CT scan found a left sided lung mass which turned out to be small cell lung cancer. He underwent 12 wks of chemoRx and a course of radiation to the L lung and to the brain. His f/u CT in August showed some opacities but his providers felt that might be changes related to the XRT.   Most recently he states that his doctors wanted to him to make some changes to improve his BS control. Then some labs were done and the Na+ was low, then a repeat was even lower so he was sent to the ED.     ROS - denies CP, no joint pain, no HA, no blurry vision, no rash, no diarrhea, no nausea/ vomiting, no dysuria, no difficulty voiding   Past Medical History  Past Medical History:  Diagnosis Date   Coronary artery disease, occlusive 06/02/2014   Multivessel CAD.mLAD-100%, mRCA 99%, dRCA 100%, mLCX 90%  And EF 35-45%. -->  Referred for CABG; nonischemic Myoview March 2020   Former heavy tobacco smoker    Quit in April 2016    GERD (gastroesophageal reflux disease)    Hyperlipidemia with target LDL less than 70    Hypertension    Ischemic cardiomyopathy - resolved 05/2014   Myoview: EF ~33% with "infarction vs. severe resting ischemia in LAD & RCA  territory; b) EF by Cath: 35-45%. 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   Stage T2 a, N0   Past Surgical History  Past Surgical History:  Procedure Laterality Date   CARDIAC CATHETERIZATION N/A 06/02/2014   Procedure: Left Heart Cath and Coronary Angiography;  Surgeon: Leonie Man, MD;  Location: [facility] INVASIVE CV LAB CUPID;  Service: Cardiovascular;  mLAD-100%, mRCA 99%, dRCA 100%, mLCX 90%  And EF 35-45%.   CORONARY ARTERY BYPASS GRAFT N/A 06/04/2014   Procedure: CORONARY ARTERY BYPASS GRAFTING (CABG) times four using bilateral internal mammary arteries and EVH for left  leg saphenous vein;  Surgeon: Gaye Pollack, MD;  Location: [place] OR;  Service: Open Heart Surgery;  LIMA-OM, RIMA-LAD, SVG-Diag, SVG-rPDA   IR IMAGING GUIDED PORT INSERTION  05/16/2021   NM MYOVIEW LTD  05/28/2014   Pre-CABG:  High Risk Nuclear Stress Test with multivessel distribution ischemia.  -- Severely ischemic Cardiomyoapthy with evidence of at least 2  vessel disease & EF of ~33%.  Images are consistent with either infarction or severe resting ischemia in the LAD & RCA territory.   NM MYOVIEW LTD  03/2018   EF 50-55%.  Small basal inferior fixed defect consistent with diaphragmatic attenuation.  No ischemia or infarction.  Septal dyskinesis due to post CABG state   TEE WITHOUT CARDIOVERSION N/A 06/04/2014   Procedure: TRANSESOPHAGEAL ECHOCARDIOGRAM (TEE);  Surgeon: Gaye Pollack, MD;  Location: [place];  Service: Open Heart Surgery;  Laterality: N/A;   THORACOTOMY Right 07/10/2016   Procedure: THORACOTOMY;  Surgeon: Gaye Pollack, MD;  Location: [facility] OR;  Service: Thoracic;  Laterality: Right;   THORACOTOMY/LOBECTOMY Right 07/29/2018   Procedure:  THORACOTOMY/RIGHT LOWER LOBECTOMY;  Surgeon: Gaye Pollack, MD;  Location: [facility] OR;  Service: Thoracic;  Laterality: Right;   [place] ECHOCARDIOGRAM  10/2014   Mild concentric LVH. EF 50-55% with mild HK of basal anteroseptal myocardium. GR 1 DD.   VIDEO ASSISTED THORACOSCOPY (VATS)/WEDGE RESECTION Right 07/10/2016   Procedure: VIDEO ASSISTED THORACOSCOPY (VATS)/WEDGE RESECTION;  Surgeon: Gaye Pollack, MD;  Location: [facility] OR;  Service: Thoracic;  Laterality: Right;   VIDEO BRONCHOSCOPY WITH ENDOBRONCHIAL NAVIGATION N/A 04/13/2021   Procedure: VIDEO BRONCHOSCOPY WITH ENDOBRONCHIAL NAVIGATION;  Surgeon: Melrose Nakayama, MD;  Location: [facility] OR;  Service: Thoracic;  Laterality: N/A;   Family History  Family History  Problem Relation Age of Onset   Emphysema Mother    Cardiomyopathy Father    Healthy Sister    Heart attack Maternal Grandfather    Healthy Sister    Heart disease Paternal Uncle    Social History  reports that he quit smoking about 7 years ago. His smoking use included cigarettes. He has a 36.80 pack-year smoking history. He has never used smokeless tobacco. He reports current alcohol use of about 7.0 standard drinks of alcohol per week. He reports that he does not use drugs. Allergies No Known Allergies Home medications Prior to Admission medications   Medication Sig Start Date End Date Taking? Authorizing Provider  acetaminophen (TYLENOL) 325 MG tablet Take 650 mg by mouth every 6 (six) hours as needed for moderate pain.   Yes [provider]  albuterol (VENTOLIN HFA) 108 (90 Base) MCG/ACT inhaler Inhale 2 puffs into the lungs every 6 (six) hours as needed. 12/08/21  Yes [provider]  amoxicillin-clavulanate (AUGMENTIN) 875-125 MG tablet Take 1 tablet by mouth 2 (two) times daily. 12/16/21  Yes [provider]  aspirin 81 MG tablet Take 81 mg by mouth daily.   Yes [provider]  atorvastatin (LIPITOR) 40 MG  tablet TAKE 1 TABLET AT BEDTIME Patient taking differently: Take 40 mg by mouth daily. 09/28/21  Yes Leonie Man, MD  azithromycin (ZITHROMAX) 250 MG tablet Take 250 mg by mouth as directed. 12/16/21  Yes [provider]  carvedilol (COREG) 3.125 MG tablet TAKE 2 TABLETS TWICE A DAY WITH     MEALS Patient taking differently: Take 6.25 mg by mouth 2 (two) times daily with a meal. 09/28/21  Yes Leonie Man, MD  losartan (COZAAR) 25 MG tablet TAKE 1 TABLET DAILY 09/28/21  Yes Leonie Man, MD  metFORMIN (GLUCOPHAGE-XR) 500 MG 24 hr tablet Take 500 mg by mouth 2 (two) times daily. 12/08/21  Yes [provider]  Pseudoephedrine-APAP 30-325 MG TABS Take 1 tablet by mouth daily as needed (sinus congestion). 04/30/21  Yes [provider]  sildenafil (VIAGRA) 50  MG tablet Take 1 tablet (50 mg total) by mouth daily as needed for erectile dysfunction. 02/23/16  Yes Leonie Man, MD  lidocaine-prilocaine (EMLA) cream Apply to the Port-A-Cath site 30 minutes before chemotherapy Patient not taking: Reported on 06/14/2021 05/10/21   Curt Bears, MD  prochlorperazine (COMPAZINE) 10 MG tablet Take 1 tablet (10 mg total) by mouth every 6 (six) hours as needed for nausea or vomiting. Patient not taking: Reported on 06/14/2021 04/27/21   Curt Bears, MD  sucralfate (CARAFATE) 1 g tablet Take 1 tablet (1 g total) by mouth 4 (four) times daily -  with meals and at bedtime. 5 min before meals for radiation induced esophagitis Patient not taking: Reported on 11/08/2021 05/20/21   Tyler Pita, MD     Vitals:   12/18/21 0405 12/18/21 0810 12/18/21 1137 12/18/21 1651  BP: (!) 149/96 125/86 (!) 140/84 (!) 158/98  Pulse: 73 81 88 73  Resp: 16 18 18 18   Temp: 97.7 F (36.5 C) 97.6 F (36.4 C) 97.7 F (36.5 C) 98 F (36.7 C)  TempSrc: Oral Oral Oral Oral  SpO2: 98% 95% 95% 96%  Weight:      Height:       Exam Gen alert, no distress No rash, cyanosis or  gangrene Sclera anicteric, throat clear  No jvd or bruits Chest clear bilat to bases, no rales/ wheezing RRR no MRG Abd soft ntnd no mass or ascites +bs GU defer MS no joint effusions or deformity Ext no LE or UE edema, no wounds or ulcers Neuro is alert, Ox 3 , nf    Home meds include - albtuerol, aspirin, lipitor, coreg, losartan 25 qd, metformin, carafate, prns/ vits/ supps     From 11/17 >> UNa 183,  UOsm 754     Na 120  K 3.9  CO2 23  BUN 12  creat 0.81  alb 3.8  LFT"s okay     wBC 6K  Hb 14  plt 228      BPs 120- 160/ 80- 95,  HR 80-95, RR 14- 20  afeb     1300 cc in and 925 UOP since admit yesterday   Assessment/ Plan: Hyponatremia - pt appears euvolemic, no hx of hyponatremia. Cortisol and TSH are wnl. There are no signs of vol overload/ edematous state. Exam is unremarkable. No SSRI's. UNa is wnl and UOsm is quite high. Suggestive of SIADH.  No meds implicated. Will plan tolvaptan x 1 tonight, f/u labs in am. Will follow.  Small cell lung carcinoma - dx'd in 03/2021, sp XRT and chemotherapy. Is under observation now by Dr Earlie Server.  HTN - agree w/ holding ARB while treating the low Na.  CAD h/o CABG HL       Rob Kalianna Verbeke  MD 12/18/2021, 8:07 PM Recent Labs  Lab 12/16/21 1751 12/17/21 0030 12/18/21 0108 12/18/21 1502  HGB 14.2  --   --   --   ALBUMIN 3.8  --   --   --   CALCIUM 9.1   < > 8.5* 9.0  CREATININE 0.78   < > 0.82 0.81  K 4.0   < > 4.1 3.9   < > = values in this interval not displayed.   Inpatient medications:  aspirin EC  81 mg Oral Daily   atorvastatin  40 mg Oral Daily   carvedilol  6.25 mg Oral BID WC   enoxaparin (LOVENOX) injection  40 mg Subcutaneous Daily    acetaminophen

## 2021-12-18 NOTE — Progress Notes (Signed)
[facility] PROGRESS NOTE    Progress Note  Kelly Olson.  VEL:[id number] DOB: Apr 11, 1950 DOA: 12/16/2021 PCP: Wenda Low, MD     Brief Narrative:   Kelly Olson. is an 71 y.o. 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  With a small bolus challenge sodium 500 cc on admission and sodium worsen worst. He is currently asymptomatic. Urine osm is greater than 300, urinary sodium is 185 TSH is 1.1, cortisol level in the morning is 16.  Blood glucose is 90s He has done really well with his fluid restriction only about 700 cc yesterday. He has a history of small cell cancer concerned about paraneoplastic syndrome. Consult nephrology.  History of CABG: Continue aspirin and resume beta blockers.  Essential hypertension: Continue amlodipine. Hold losartan.  Hyperkalemia: He became acidotic was given bicarbonate tablets and Kayexalate his potassium this morning is improved.  Squamous cell carcinoma on chemotherapy and radiation: now under observation by Dr. Earlie Server.  Hyperlipidemia: Continue statins.     DVT prophylaxis: lovenox Family Communication:none Status is: Observation The patient remains OBS appropriate and will d/c before 2 midnights.  Sodium continues to drop.    Code Status:     Code Status Orders  (From admission, onward)           Start     Ordered   12/17/21 0000  Full code  Continuous        12/17/21 0000           Code Status History     Date Active Date Inactive Code Status Order ID Comments User Context   07/29/2018 1242  08/02/2018 1327 Full Code [id number]  Gaye Pollack, MD Inpatient   07/10/2016 1303 07/26/2016 1833 Full Code [id number]  Elgie Collard, PA-C Inpatient   06/04/2014 1305 06/08/2014 1458 Full Code [id number]  Nani Skillern, PA-C Inpatient   06/02/2014 0841 06/04/2014 1305 Full Code [id number]  Leonie Man, MD Inpatient         IV Access:   Peripheral IV   Procedures and diagnostic studies:   No results found.   Medical Consultants:   None.   Subjective:    Kelly Olson. no complaints this morning feels good.  Objective:    Vitals:   12/17/21 2003 12/17/21 2344 12/18/21 0405 12/18/21 0810  BP: 135/88 (!) 144/72 (!) 149/96 125/86  Pulse: 78 73 73 81  Resp: 16 14 16 18   Temp: (!) 97.5 F (36.4 C) (!) 97.5 F (36.4 C) 97.7 F (36.5 C) 97.6 F (36.4 C)  TempSrc: Oral Oral Oral Oral  SpO2: 94% 97% 98% 95%  Weight:      Height:       SpO2: 95 %   Intake/Output Summary (Last 24 hours) at 12/18/2021 1017 Last data filed at 12/18/2021 0840 Gross per 24 hour  Intake 630 ml  Output 550 ml  Net 80 ml   Filed Weights   12/16/21 1750  Weight: 71.7 kg    Exam: General exam: In no acute distress. Respiratory system: Good air movement and clear to auscultation. Cardiovascular  system: S1 & S2 heard, RRR. No JVD. Gastrointestinal system: Abdomen is nondistended, soft and nontender.  Extremities: No pedal edema. Skin: No rashes, lesions or ulcers Psychiatry: Judgement and insight appear normal. Mood & affect appropriate.   Data Reviewed:    Labs: Basic Metabolic Panel: Recent Labs  Lab 12/16/21 1751 12/17/21 0030 12/17/21 0520 12/17/21 1607 12/18/21 0108  NA 124* 123* 122* 122* 122*  K 4.0 3.7 3.6 5.4* 4.1  CL 88* 89* 89* 88* 86*  CO2 25 23 23  18* 26  GLUCOSE 101* 82 91 102* 89  BUN 11 10 8 10 10   CREATININE 0.78 0.67 0.66 0.68 0.82  CALCIUM 9.1 8.4* 8.7* 8.9 8.5*    GFR Estimated Creatinine Clearance: 83.8 mL/min (by C-G formula  based on SCr of 0.82 mg/dL). Liver Function Tests: Recent Labs  Lab 12/16/21 1751  AST 26  ALT 22  ALKPHOS 79  BILITOT 0.7  PROT 6.8  ALBUMIN 3.8    No results for input(s): "LIPASE", "AMYLASE" in the last 168 hours. No results for input(s): "AMMONIA" in the last 168 hours. Coagulation profile No results for input(s): "INR", "PROTIME" in the last 168 hours. COVID-19 Labs  No results for input(s): "DDIMER", "FERRITIN", "LDH", "CRP" in the last 72 hours.  Lab Results  Component Value Date   SARSCOV2NAA NEGATIVE 07/27/2018   [place] NEGATIVE 07/24/2018    CBC: Recent Labs  Lab 12/16/21 1751  WBC 6.3  NEUTROABS 4.6  HGB 14.2  HCT 40.5  MCV 93.8  PLT 228    Cardiac Enzymes: No results for input(s): "CKTOTAL", "CKMB", "CKMBINDEX", "TROPONINI" in the last 168 hours. BNP (last 3 results) No results for input(s): "PROBNP" in the last 8760 hours. CBG: No results for input(s): "GLUCAP" in the last 168 hours. D-Dimer: No results for input(s): "DDIMER" in the last 72 hours. Hgb A1c: No results for input(s): "HGBA1C" in the last 72 hours. Lipid Profile: No results for input(s): "CHOL", "HDL", "LDLCALC", "TRIG", "CHOLHDL", "LDLDIRECT" in the last 72 hours. Thyroid function studies: Recent Labs    12/17/21 0520  TSH 1.164   Anemia work up: No results for input(s): "VITAMINB12", "FOLATE", "FERRITIN", "TIBC", "IRON", "RETICCTPCT" in the last 72 hours. Sepsis Labs: Recent Labs  Lab 12/16/21 1751 12/17/21 0030  PROCALCITON  --  <0.10  WBC 6.3  --     Microbiology No results found for this or any previous visit (from the past 240 hour(s)).   Medications:    aspirin EC  81 mg Oral Daily   atorvastatin  40 mg Oral Daily   enoxaparin (LOVENOX) injection  40 mg Subcutaneous Daily   Continuous Infusions:    LOS: 1 day   Charlynne Cousins  [facility]  12/18/2021, 10:17 AM

## 2021-12-19 DIAGNOSIS — E871 Hypo-osmolality and hyponatremia: Secondary | ICD-10-CM | POA: Diagnosis not present

## 2021-12-19 LAB — BASIC METABOLIC PANEL
Anion gap: 14 (ref 5–15)
BUN: 10 mg/dL (ref 8–23)
CO2: 23 mmol/L (ref 22–32)
Calcium: 9.4 mg/dL (ref 8.9–10.3)
Chloride: 88 mmol/L — ABNORMAL LOW (ref 98–111)
Creatinine, Ser: 0.69 mg/dL (ref 0.61–1.24)
GFR, Estimated: >60 mL/min (ref >60)
Glucose, Bld: 98 mg/dL (ref 70–99)
Potassium: 3.7 mmol/L (ref 3.5–5.1)
Sodium: 125 mmol/L — ABNORMAL LOW (ref 135–145)

## 2021-12-19 LAB — SODIUM: Sodium: 129 mmol/L — ABNORMAL LOW (ref 135–145)

## 2021-12-19 MED ORDER — UREA 15 G PO PACK
15.0000 g | PACK | Freq: Two times a day (BID) | ORAL | Status: DC
  Administered 2021-12-19 – 2021-12-20 (×3): 15 g via ORAL
  Filled 2021-12-19 (×4): qty 1

## 2021-12-19 MED ORDER — UREA 15 G PO PACK: 15.0000 g | PACK | Freq: Two times a day (BID) | ORAL | Status: DC

## 2021-12-19 NOTE — Progress Notes (Signed)
[facility] PROGRESS NOTE    Progress Note  Kelly Olson.  YTK:[id number] DOB: 1950/02/22 DOA: 12/16/2021 PCP: Wenda Low, MD     Brief Narrative:   Kelly Olson. is an 71 y.o. 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 Nephrology was consulted started on  tolvaptan his sodium this morning is 125. He was started on urea now we will recheck a basic metabolic panel this afternoon and then again tomorrow if he continues to improve hopefully he can be discharged tomorrow morning.  History of CABG: Continue aspirin and resume beta blockers.  Essential hypertension: Continue amlodipine. Hold losartan.  Hyperkalemia: He became acidotic was given bicarbonate tablets and Kayexalate his potassium this morning is improved.  Squamous cell carcinoma on chemotherapy and radiation: now under observation by Dr. Earlie Server.  Hyperlipidemia: Continue statins.     DVT prophylaxis: lovenox Family Communication:none Status is: Observation The patient remains OBS appropriate and will d/c before 2 midnights.  Sodium continues to drop.    Code Status:     Code Status Orders  (From admission, onward)           Start     Ordered   12/17/21 0000  Full code  Continuous        12/17/21 0000           Code Status History     Date Active Date Inactive Code Status Order ID Comments User Context   07/29/2018 1242 08/02/2018 1327 Full Code [id number]  Gaye Pollack, MD Inpatient   07/10/2016 1303 07/26/2016 1833 Full Code [id number]  Elgie Collard, PA-C Inpatient   06/04/2014 1305 06/08/2014 1458 Full Code  [id number]  Nani Skillern, PA-C Inpatient   06/02/2014 0841 06/04/2014 1305 Full Code [id number]  Leonie Man, MD Inpatient         IV Access:   Peripheral IV   Procedures and diagnostic studies:   No results found.   Medical Consultants:   None.   Subjective:    Kelly Olson. has no new complaints Objective:    Vitals:   12/18/21 2323 12/19/21 0500 12/19/21 0554 12/19/21 0805  BP: 138/78  107/68 102/80  Pulse: 81  83 89  Resp: 16 13 18 15   Temp: 97.8 F (36.6 C)  97.9 F (36.6 C) 98.1 F (36.7 C)  TempSrc: Oral Oral Oral Oral  SpO2: 98%  95% 90%  Weight:      Height:       SpO2: 90 %   Intake/Output Summary (Last 24 hours) at 12/19/2021 0933 Last data filed at 12/19/2021 0834 Gross per 24 hour  Intake 480 ml  Output 2275 ml  Net -1795 ml    Filed Weights   12/16/21 1750  Weight: 71.7 kg    Exam: General exam: In no acute distress. Respiratory system: Good air movement and clear to auscultation. Cardiovascular system: S1 & S2 heard, RRR. No JVD. Gastrointestinal system: Abdomen is nondistended, soft and nontender.  Extremities: No pedal edema. Skin: No rashes, lesions or ulcers Psychiatry: Judgement and insight appear normal. Mood & affect appropriate.   Data Reviewed:  Labs: Basic Metabolic Panel: Recent Labs  Lab 12/17/21 0520 12/17/21 1607 12/18/21 0108 12/18/21 1502 12/19/21 0604  NA 122* 122* 122* 120* 125*  K 3.6 5.4* 4.1 3.9 3.7  CL 89* 88* 86* 85* 88*  CO2 23 18* 26 23 23   GLUCOSE 91 102* 89 120* 98  BUN 8 10 10 12 10   CREATININE 0.66 0.68 0.82 0.81 0.69  CALCIUM 8.7* 8.9 8.5* 9.0 9.4    GFR Estimated Creatinine Clearance: 85.9 mL/min (by C-G formula based on SCr of 0.69 mg/dL). Liver Function Tests: Recent Labs  Lab 12/16/21 1751  AST 26  ALT 22  ALKPHOS 79  BILITOT 0.7  PROT 6.8  ALBUMIN 3.8    No results for input(s): "LIPASE", "AMYLASE" in the last 168 hours. No results for  input(s): "AMMONIA" in the last 168 hours. Coagulation profile No results for input(s): "INR", "PROTIME" in the last 168 hours. COVID-19 Labs  No results for input(s): "DDIMER", "FERRITIN", "LDH", "CRP" in the last 72 hours.  Lab Results  Component Value Date   SARSCOV2NAA NEGATIVE 07/27/2018   [place] NEGATIVE 07/24/2018    CBC: Recent Labs  Lab 12/16/21 1751  WBC 6.3  NEUTROABS 4.6  HGB 14.2  HCT 40.5  MCV 93.8  PLT 228    Cardiac Enzymes: No results for input(s): "CKTOTAL", "CKMB", "CKMBINDEX", "TROPONINI" in the last 168 hours. BNP (last 3 results) No results for input(s): "PROBNP" in the last 8760 hours. CBG: No results for input(s): "GLUCAP" in the last 168 hours. D-Dimer: No results for input(s): "DDIMER" in the last 72 hours. Hgb A1c: No results for input(s): "HGBA1C" in the last 72 hours. Lipid Profile: No results for input(s): "CHOL", "HDL", "LDLCALC", "TRIG", "CHOLHDL", "LDLDIRECT" in the last 72 hours. Thyroid function studies: Recent Labs    12/17/21 0520  TSH 1.164    Anemia work up: No results for input(s): "VITAMINB12", "FOLATE", "FERRITIN", "TIBC", "IRON", "RETICCTPCT" in the last 72 hours. Sepsis Labs: Recent Labs  Lab 12/16/21 1751 12/17/21 0030  PROCALCITON  --  <0.10  WBC 6.3  --     Microbiology No results found for this or any previous visit (from the past 240 hour(s)).   Medications:    aspirin EC  81 mg Oral Daily   atorvastatin  40 mg Oral Daily   carvedilol  6.25 mg Oral BID WC   enoxaparin (LOVENOX) injection  40 mg Subcutaneous Daily   urea  15 g Oral BID   Continuous Infusions:    LOS: 2 days   Charlynne Cousins  [facility]  12/19/2021, 9:33 AM

## 2021-12-19 NOTE — Progress Notes (Signed)
Nephrology Follow-Up Consult note   Assessment/Recommendations: Kelly Sleight. is a/an 71 y.o. male with a past medical history significant for CAD, lung cancer, CHF (recovered EF), admitted for hyponatremia.      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  Status post tolvaptan on 11/19 with good response.  Asymptomatic.  Will start urea 15 g twice daily today.  Goal sodium around 132 x 6 a.m. on 12/20/2021.  Repeat sodium this afternoon. Small cell lung carcinoma - dx'd in 03/2021, sp XRT and chemotherapy. Is under observation now by Dr Earlie Server.  HTN - agree w/ holding ARB while treating the low Na.  Likely can restart at discharge CAD h/o CABG HL    Recommendations conveyed to primary service.    [facility] 12/19/2021 9:11 AM  ___________________________________________________________  CC: Hyponatremia  Interval History/Subjective: Patient feels well today.  Denies confusion, nausea, vomiting, weakness.  Urinated a lot over the past 24 hours.   Medications:  Current Facility-Administered Medications  Medication Dose Route Frequency Provider Last Rate Last Admin   acetaminophen (TYLENOL) tablet 650 mg  650 mg Oral Q6H PRN Tu, Ching T, DO       aspirin EC tablet 81 mg  81 mg Oral Daily Tu, Ching T, DO   81 mg at 12/19/21 0902   atorvastatin (LIPITOR) tablet 40 mg  40 mg Oral Daily Tu, Ching T, DO   40 mg at 12/19/21 0902   carvedilol (COREG) tablet 6.25 mg  6.25 mg Oral BID WC Charlynne Cousins, MD   6.25 mg at 12/19/21 0902   enoxaparin (LOVENOX) injection 40 mg  40 mg Subcutaneous Daily Tu, Ching T, DO   40 mg at 12/19/21 0903   urea (URE-NA) oral packet 15 g  15 g Oral BID Reesa Chew, MD   15 g at 12/19/21 0901      Review of Systems: 10 systems reviewed and negative except per interval history/subjective  Physical Exam: Vitals:   12/19/21 0554 12/19/21 0805  BP: 107/68 102/80  Pulse: 83 89  Resp: 18  15  Temp: 97.9 F (36.6 C) 98.1 F (36.7 C)  SpO2: 95% 90%   Total I/O In: 240 [P.O.:240] Out: 550 [Urine:550]  Intake/Output Summary (Last 24 hours) at 12/19/2021 3716 Last data filed at 12/19/2021 9678 Gross per 24 hour  Intake 480 ml  Output 2275 ml  Net -1795 ml   Constitutional: well-appearing, no acute distress ENMT: ears and nose without scars or lesions, MMM CV: normal rate, no edema Respiratory: Bilateral chest rise, normal work of breathing Gastrointestinal: soft, non-tender, no palpable masses or hernias Skin: no visible lesions or rashes Psych: alert, judgement/insight appropriate, appropriate mood and affect   Test Results I personally reviewed new and old clinical labs and radiology tests Lab Results  Component Value Date   NA 125 (L) 12/19/2021   K 3.7 12/19/2021   CL 88 (L) 12/19/2021   CO2 23 12/19/2021   BUN 10 12/19/2021   CREATININE 0.69 12/19/2021   CALCIUM 9.4 12/19/2021   ALBUMIN 3.8 12/16/2021    CBC Recent Labs  Lab 12/16/21 1751  WBC 6.3  NEUTROABS 4.6  HGB 14.2  HCT 40.5  MCV 93.8  PLT 228

## 2021-12-19 NOTE — Progress Notes (Signed)
Mobility Specialist Progress Note:   12/19/21 1044  Mobility  Activity Ambulated with assistance in hallway  Level of Assistance Modified independent, requires aide device or extra time  Assistive Device None  Distance Ambulated (ft) 550 ft  Activity Response Tolerated well  $Mobility charge 1 Mobility   Pt received in bed willing to participate in mobility. No complaints of pain. Left EOB with call bell in reach and all needs met.   Gareth Eagle Zhamir Pirro Mobility Specialist Please contact via [employer] or  Rehab Office at [phone]

## 2021-12-19 NOTE — Plan of Care (Signed)
  Problem: Education: Goal: Knowledge of General Education information will improve Description: Including pain rating scale, medication(s)/side effects and non-pharmacologic comfort measures Outcome: Progressing   Problem: Health Behavior/Discharge Planning: Goal: Ability to manage health-related needs will improve Outcome: Progressing   Problem: Clinical Measurements: Goal: Ability to maintain clinical measurements within normal limits will improve Outcome: Progressing Goal: Respiratory complications will improve Outcome: Progressing   Problem: Pain Managment: Goal: General experience of comfort will improve Outcome: Progressing

## 2021-12-20 ENCOUNTER — Encounter: Payer: Self-pay | Admitting: Internal Medicine

## 2021-12-20 ENCOUNTER — Other Ambulatory Visit (HOSPITAL_COMMUNITY): Payer: Self-pay

## 2021-12-20 LAB — BASIC METABOLIC PANEL
Anion gap: 14 (ref 5–15)
BUN: 44 mg/dL — ABNORMAL HIGH (ref 8–23)
CO2: 24 mmol/L (ref 22–32)
Calcium: 9.8 mg/dL (ref 8.9–10.3)
Chloride: 90 mmol/L — ABNORMAL LOW (ref 98–111)
Creatinine, Ser: 0.77 mg/dL (ref 0.61–1.24)
GFR, Estimated: >60 mL/min (ref >60)
Glucose, Bld: 107 mg/dL — ABNORMAL HIGH (ref 70–99)
Potassium: 3.5 mmol/L (ref 3.5–5.1)
Sodium: 128 mmol/L — ABNORMAL LOW (ref 135–145)

## 2021-12-20 MED ORDER — UREA 15 G PO PACK
15.0000 g | PACK | Freq: Two times a day (BID) | ORAL | 1 refills | Status: DC
  Filled 2021-12-20: qty 60, 30d supply, fill #0

## 2021-12-20 MED ORDER — UREA 15 G PO PACK: 15.0000 g | PACK | Freq: Two times a day (BID) | ORAL | 1 refills | Status: DC

## 2021-12-20 MED ORDER — LOSARTAN POTASSIUM 25 MG PO TABS: 25.0000 mg | ORAL_TABLET | Freq: Every day | ORAL | 3 refills | Status: AC

## 2021-12-20 NOTE — Progress Notes (Signed)
Patient discharging home with wife. IV removed without complications. Tele removed and CCMD notified. Discharge instructions provided by SWOT RN. All questions answered.  Kelly Olson

## 2021-12-20 NOTE — Care Management Important Message (Signed)
Important Message  Patient Details  Name: Kelly Olson. MRN: [id number] Date of Birth: 03-04-1950   Medicare Important Message Given:  Yes     Shelda Altes 12/20/2021, 10:19 AM

## 2021-12-20 NOTE — Plan of Care (Signed)

## 2021-12-20 NOTE — Discharge Summary (Signed)
Physician Discharge Summary  Kelly Olson. YYT:[id number] DOB: 10/28/1950 DOA: 12/16/2021  PCP: Wenda Low, MD  Admit date: 12/16/2021 Discharge date: 12/20/2021  Admitted From: Home Disposition:  home  Recommendations for Outpatient Follow-up:  Follow up with PCP in 1-2 weeks Check basic metabolic panel this week.  Home Health:No Equipment/Devices:None  Discharge Condition:Stable CODE STATUS:Full Diet recommendation: Heart Healthy  Brief/Interim Summary:  71 y.o. 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 He was fluid restricted his sodium stabilized nephrology was consulted was started on tolvaptan and urea and sodium improved on the day of discharge 128 he will continue urea as an outpatient check a basic metabolic panel this week.  History of CABG: No changes made to his medication.  Essential hypertension: Losartan was held he was continue on Coreg.   Will need to follow-up with PCP restart losartan as tolerated.  Hyperkalemia: He became acidotic was given bicarbonate tablets and oral Kayexalate his potassium improved. Will check a basic metabolic panel this week.  History of small cell lung cancer: With Dr. Julien Nordmann as an  outpatient.  Hyperlipidemia: Continue statins.  Discharge Instructions  Discharge Instructions     Diet - low sodium heart healthy   Complete by: As directed    Increase activity slowly   Complete by: As directed       Allergies as of 12/20/2021   No Known Allergies      Medication List     STOP taking these medications    lidocaine-prilocaine cream Commonly known as: EMLA       TAKE these medications    acetaminophen 325 MG tablet Commonly known as: TYLENOL Take 650 mg by mouth every 6 (six) hours as needed for moderate pain.   albuterol 108 (90 Base) MCG/ACT inhaler Commonly known as: VENTOLIN HFA Inhale 2 puffs into the lungs every 6 (six) hours as needed.   amoxicillin-clavulanate 875-125 MG tablet Commonly known as: AUGMENTIN Take 1 tablet by mouth 2 (two) times daily.   aspirin 81 MG tablet Take 81 mg by mouth daily.   atorvastatin 40 MG tablet Commonly known as: LIPITOR TAKE 1 TABLET AT BEDTIME What changed: when to take this   azithromycin 250 MG tablet Commonly known as: ZITHROMAX Take 250 mg by mouth as directed.   carvedilol 3.125 MG tablet Commonly known as: COREG TAKE 2 TABLETS TWICE A DAY WITH     MEALS What changed: See the new instructions.   losartan 25 MG tablet Commonly known as: COZAAR Take 1 tablet (25 mg total) by mouth daily. Start taking on: January 03, 2022 What changed: These instructions start on January 03, 2022. If you are unsure what to do until then, ask your doctor or other care provider.   metFORMIN 500 MG  24 hr tablet Commonly known as: GLUCOPHAGE-XR Take 500 mg by mouth 2 (two) times daily.   prochlorperazine 10 MG tablet Commonly known as: COMPAZINE Take 1 tablet (10 mg total) by mouth every 6 (six) hours as needed for nausea or vomiting.   Pseudoephedrine-APAP 30-325 MG Tabs Take 1 tablet by mouth daily as needed (sinus congestion).   sildenafil 50 MG tablet Commonly known as: Viagra Take 1 tablet  (50 mg total) by mouth daily as needed for erectile dysfunction.   sucralfate 1 g tablet Commonly known as: Carafate Take 1 tablet (1 g total) by mouth 4 (four) times daily -  with meals and at bedtime. 5 min before meals for radiation induced esophagitis   urea 15 g Pack oral packet Commonly known as: URE-NA Take 15 g by mouth 2 (two) times daily.        No Known Allergies  Consultations: Nephrology   Procedures/Studies: DG Ribs Unilateral W/Chest Left  Result Date: 12/15/2021 CLINICAL DATA:  Left-sided rib pain. Cough. History of lung cancer EXAM: LEFT RIBS AND CHEST - 3+ VIEW COMPARISON:  Chest radiograph 04/16/2021, chest CT 11/04/2021 FINDINGS: Prior median sternotomy. Right chest port in place. Hyperinflation with emphysema. There is increasing ill-defined and patchy opacity in the left lower lobe. Pulmonary nodules on CT are not well seen by radiograph. Suspect small bilateral pleural effusions. No rib fracture or evidence of focal rib abnormality. Cortical margins of the left ribs are intact. No obvious bony destruction. IMPRESSION: 1. No left rib fracture or focal rib abnormality. 2. Increasing ill-defined and patchy opacity in the left lower lobe, assessed with CT last month. While findings may represent evolving post radiation change, recommend close clinical and radiologic follow-up as the possibility of superimposed infection could have a similar appearance. Electronically Signed   By: Keith Rake M.D.   On: 12/15/2021 11:27   (Echo, Carotid, EGD, Colonoscopy, ERCP)    Subjective: No complaints  Discharge Exam: Vitals:   12/20/21 0435 12/20/21 0728  BP: 100/69 97/67  Pulse: 84 88  Resp: 20 20  Temp: 97.8 F (36.6 C) 97.8 F (36.6 C)  SpO2: 98% 98%   Vitals:   12/19/21 2030 12/19/21 2359 12/20/21 0435 12/20/21 0728  BP: (!) 88/64 111/82 100/69 97/67  Pulse: 98 87 84 88  Resp: 20 20 20 20   Temp: (!) 97.5 F (36.4 C) (!) 97.4 F (36.3 C) 97.8 F (36.6  C) 97.8 F (36.6 C)  TempSrc: Oral Oral Oral Oral  SpO2: 94% 96% 98% 98%  Weight:      Height:        General: Pt is alert, awake, not in acute distress Cardiovascular: RRR, S1/S2 +, no rubs, no gallops Respiratory: CTA bilaterally, no wheezing, no rhonchi Abdominal: Soft, NT, ND, bowel sounds + Extremities: no edema, no cyanosis    The results of significant diagnostics from this hospitalization (including imaging, microbiology, ancillary and laboratory) are listed below for reference.     Microbiology: No results found for this or any previous visit (from the past 240 hour(s)).   Labs: BNP (last 3 results) No results for input(s): "BNP" in the last 8760 hours. Basic Metabolic Panel: Recent Labs  Lab 12/17/21 1607 12/18/21 0108 12/18/21 1502 12/19/21 0604 12/19/21 1533 12/20/21 0114  NA 122* 122* 120* 125* 129* 128*  K 5.4* 4.1 3.9 3.7  --  3.5  CL 88* 86* 85* 88*  --  90*  CO2 18* 26 23 23   --  24  GLUCOSE 102* 89 120* 98  --  107*  BUN 10 10 12 10   --  44*  CREATININE 0.68 0.82 0.81 0.69  --  0.77  CALCIUM 8.9 8.5* 9.0 9.4  --  9.8   Liver Function Tests: Recent Labs  Lab 12/16/21 1751  AST 26  ALT 22  ALKPHOS 79  BILITOT 0.7  PROT 6.8  ALBUMIN 3.8   No results for input(s): "LIPASE", "AMYLASE" in the last 168 hours. No results for input(s): "AMMONIA" in the last 168 hours. CBC: Recent Labs  Lab 12/16/21 1751  WBC 6.3  NEUTROABS 4.6  HGB 14.2  HCT 40.5  MCV 93.8  PLT 228   Cardiac Enzymes: No results for input(s): "CKTOTAL", "CKMB", "CKMBINDEX", "TROPONINI" in the last 168 hours. BNP: Invalid input(s): "POCBNP" CBG: No results for input(s): "GLUCAP" in the last 168 hours. D-Dimer No results for input(s): "DDIMER" in the last 72 hours. Hgb A1c No results for input(s): "HGBA1C" in the last 72 hours. Lipid Profile No results for input(s): "CHOL", "HDL", "LDLCALC", "TRIG", "CHOLHDL", "LDLDIRECT" in the last 72 hours. Thyroid function  studies No results for input(s): "TSH", "T4TOTAL", "T3FREE", "THYROIDAB" in the last 72 hours.  Invalid input(s): "FREET3" Anemia work up No results for input(s): "VITAMINB12", "FOLATE", "FERRITIN", "TIBC", "IRON", "RETICCTPCT" in the last 72 hours. Urinalysis    Component Value Date/Time   COLORURINE YELLOW 07/24/2018 1102   APPEARANCEUR CLEAR 07/24/2018 1102   LABSPEC 1.019 07/24/2018 1102   PHURINE 5.0 07/24/2018 1102   GLUCOSEU NEGATIVE 07/24/2018 1102   HGBUR SMALL (A) 07/24/2018 1102   BILIRUBINUR NEGATIVE 07/24/2018 [place] 07/24/2018 1102   PROTEINUR NEGATIVE 07/24/2018 1102   NITRITE NEGATIVE 07/24/2018 [place] 07/24/2018 1102   Sepsis Labs Recent Labs  Lab 12/16/21 1751  WBC 6.3   Microbiology No results found for this or any previous visit (from the past 240 hour(s)).   SIGNED:   Charlynne Cousins, MD  [facility] 12/20/2021, 9:27 AM Pager   If 7PM-7AM, please contact night-coverage [email or web address] Password [email or web address]

## 2021-12-20 NOTE — Progress Notes (Signed)
Nephrology Follow-Up Consult note   Assessment/Recommendations: Future Kelly Olson. is a/an 71 y.o. male with a past medical history significant for CAD, lung cancer, CHF (recovered EF), admitted for hyponatremia.      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  Status post tolvaptan on 11/19 with good response.  Asymptomatic.  Continue urea 15 g twice daily at discharge.  Continue with volume restriction around 1200 cc daily.  I will arrange labs on Friday with plans to follow-up in our clinic.  Patient is stable for discharge from our perspective Small cell lung carcinoma - dx'd in 03/2021, sp XRT and chemotherapy. Is under observation now by Dr Earlie Server.  Recommend outpatient follow-up HTN - agree w/ holding ARB while treating the low Na.  Likely can restart at discharge CAD h/o CABG HL    Recommendations conveyed to primary service.    [facility] 12/20/2021 11:45 AM  ___________________________________________________________  CC: Hyponatremia  Interval History/Subjective: Feels well today with no complaints.  Sodium improved to 128.   Medications:  Current Facility-Administered Medications  Medication Dose Route Frequency Provider Last Rate Last Admin   acetaminophen (TYLENOL) tablet 650 mg  650 mg Oral Q6H PRN Charlynne Cousins, MD       aspirin EC tablet 81 mg  81 mg Oral Daily Charlynne Cousins, MD   81 mg at 12/20/21 0807   atorvastatin (LIPITOR) tablet 40 mg  40 mg Oral Daily Charlynne Cousins, MD   40 mg at 12/20/21 0806   carvedilol (COREG) tablet 6.25 mg  6.25 mg Oral BID WC Charlynne Cousins, MD   6.25 mg at 12/19/21 1719   enoxaparin (LOVENOX) injection 40 mg  40 mg Subcutaneous Daily Charlynne Cousins, MD   40 mg at 12/20/21 0807   urea (URE-NA) oral packet 15 g  15 g Oral BID Charlynne Cousins, MD   15 g at 12/20/21 8270      Review of Systems: 10 systems reviewed and negative except per  interval history/subjective  Physical Exam: Vitals:   12/20/21 0728 12/20/21 1028  BP: 97/67 101/87  Pulse: 88   Resp: 20   Temp: 97.8 F (36.6 C)   SpO2: 98%    Total I/O In: 240 [P.O.:240] Out: 325 [Urine:325]  Intake/Output Summary (Last 24 hours) at 12/20/2021 1145 Last data filed at 12/20/2021 1026 Gross per 24 hour  Intake 480 ml  Output 1300 ml  Net -820 ml   Constitutional: well-appearing, no acute distress ENMT: ears and nose without scars or lesions, MMM CV: normal rate, no edema Respiratory: Bilateral chest rise, normal work of breathing Gastrointestinal: soft, non-tender, no palpable masses or hernias Skin: no visible lesions or rashes Psych: alert, judgement/insight appropriate, appropriate mood and affect   Test Results I personally reviewed new and old clinical labs and radiology tests Lab Results  Component Value Date   NA 128 (L) 12/20/2021   K 3.5 12/20/2021   CL 90 (L) 12/20/2021   CO2 24 12/20/2021   BUN 44 (H) 12/20/2021   CREATININE 0.77 12/20/2021   CALCIUM 9.8 12/20/2021   ALBUMIN 3.8 12/16/2021    CBC Recent Labs  Lab 12/16/21 1751  WBC 6.3  NEUTROABS 4.6  HGB 14.2  HCT 40.5  MCV 93.8  PLT 228

## 2021-12-23 DIAGNOSIS — N189 Chronic kidney disease, unspecified: Secondary | ICD-10-CM | POA: Diagnosis not present

## 2021-12-29 DIAGNOSIS — E871 Hypo-osmolality and hyponatremia: Secondary | ICD-10-CM | POA: Diagnosis not present

## 2021-12-29 DIAGNOSIS — Z85118 Personal history of other malignant neoplasm of bronchus and lung: Secondary | ICD-10-CM | POA: Diagnosis not present

## 2021-12-29 DIAGNOSIS — I10 Essential (primary) hypertension: Secondary | ICD-10-CM | POA: Diagnosis not present

## 2021-12-29 DIAGNOSIS — E119 Type 2 diabetes mellitus without complications: Secondary | ICD-10-CM | POA: Diagnosis not present

## 2022-01-02 ENCOUNTER — Inpatient Hospital Stay: Payer: Medicare Other | Attending: Internal Medicine

## 2022-01-02 ENCOUNTER — Inpatient Hospital Stay (HOSPITAL_BASED_OUTPATIENT_CLINIC_OR_DEPARTMENT_OTHER): Payer: Medicare Other | Admitting: Internal Medicine

## 2022-01-02 VITALS — BP 138/79 | HR 84 | Temp 98.0°F | Resp 16 | Wt 148.5 lb

## 2022-01-02 DIAGNOSIS — Z79899 Other long term (current) drug therapy: Secondary | ICD-10-CM | POA: Insufficient documentation

## 2022-01-02 DIAGNOSIS — C3432 Malignant neoplasm of lower lobe, left bronchus or lung: Secondary | ICD-10-CM | POA: Insufficient documentation

## 2022-01-02 DIAGNOSIS — E119 Type 2 diabetes mellitus without complications: Secondary | ICD-10-CM | POA: Diagnosis not present

## 2022-01-02 DIAGNOSIS — I10 Essential (primary) hypertension: Secondary | ICD-10-CM | POA: Insufficient documentation

## 2022-01-02 DIAGNOSIS — C3490 Malignant neoplasm of unspecified part of unspecified bronchus or lung: Secondary | ICD-10-CM | POA: Diagnosis not present

## 2022-01-02 DIAGNOSIS — E871 Hypo-osmolality and hyponatremia: Secondary | ICD-10-CM | POA: Diagnosis not present

## 2022-01-02 DIAGNOSIS — C3492 Malignant neoplasm of unspecified part of left bronchus or lung: Secondary | ICD-10-CM

## 2022-01-02 DIAGNOSIS — Z902 Acquired absence of lung [part of]: Secondary | ICD-10-CM | POA: Insufficient documentation

## 2022-01-02 DIAGNOSIS — C7931 Secondary malignant neoplasm of brain: Secondary | ICD-10-CM | POA: Insufficient documentation

## 2022-01-02 DIAGNOSIS — C7951 Secondary malignant neoplasm of bone: Secondary | ICD-10-CM | POA: Insufficient documentation

## 2022-01-02 DIAGNOSIS — R634 Abnormal weight loss: Secondary | ICD-10-CM | POA: Diagnosis not present

## 2022-01-02 DIAGNOSIS — Z5111 Encounter for antineoplastic chemotherapy: Secondary | ICD-10-CM | POA: Diagnosis not present

## 2022-01-02 LAB — CBC WITH DIFFERENTIAL (CANCER CENTER ONLY)
Abs Immature Granulocytes: 0.08 10*3/uL — ABNORMAL HIGH (ref 0.00–0.07)
Basophils Absolute: 0.1 10*3/uL (ref 0.0–0.1)
Basophils Relative: 1 %
Eosinophils Absolute: 0.3 10*3/uL (ref 0.0–0.5)
Eosinophils Relative: 3 %
HCT: 45 % (ref 39.0–52.0)
Hemoglobin: 14.8 g/dL (ref 13.0–17.0)
Immature Granulocytes: 1 %
Lymphocytes Relative: 6 %
Lymphs Abs: 0.6 10*3/uL — ABNORMAL LOW (ref 0.7–4.0)
MCH: 32.4 pg (ref 26.0–34.0)
MCHC: 32.9 g/dL (ref 30.0–36.0)
MCV: 98.5 fL (ref 80.0–100.0)
Monocytes Absolute: 0.9 10*3/uL (ref 0.1–1.0)
Monocytes Relative: 10 %
Neutro Abs: 7.5 10*3/uL (ref 1.7–7.7)
Neutrophils Relative %: 79 %
Platelet Count: 211 10*3/uL (ref 150–400)
RBC: 4.57 MIL/uL (ref 4.22–5.81)
RDW: 14 % (ref 11.5–15.5)
WBC Count: 9.5 10*3/uL (ref 4.0–10.5)
nRBC: 0 % (ref 0.0–0.2)

## 2022-01-02 LAB — CMP (CANCER CENTER ONLY)
ALT: 25 U/L (ref 0–44)
AST: 25 U/L (ref 15–41)
Albumin: 4.4 g/dL (ref 3.5–5.0)
Alkaline Phosphatase: 80 U/L (ref 38–126)
Anion gap: 8 (ref 5–15)
BUN: 32 mg/dL — ABNORMAL HIGH (ref 8–23)
CO2: 28 mmol/L (ref 22–32)
Calcium: 9.9 mg/dL (ref 8.9–10.3)
Chloride: 103 mmol/L (ref 98–111)
Creatinine: 0.75 mg/dL (ref 0.61–1.24)
GFR, Estimated: >60 mL/min (ref >60)
Glucose, Bld: 119 mg/dL — ABNORMAL HIGH (ref 70–99)
Potassium: 4.5 mmol/L (ref 3.5–5.1)
Sodium: 139 mmol/L (ref 135–145)
Total Bilirubin: 0.7 mg/dL (ref 0.3–1.2)
Total Protein: 7.3 g/dL (ref 6.5–8.1)

## 2022-01-02 NOTE — Progress Notes (Signed)
[facility] [phone]   [phone]  OFFICE PROGRESS NOTE  Wenda Low, MD [address] [place] [address]  DIAGNOSIS: Unresectable limited stage IIb (T3, N0, M0) small cell lung cancer presented with 2 pulmonary nodules in the left lower lobe with no evidence of metastatic disease to the lymph nodes or distant metastasis diagnosed in March 2023  PRIOR THERAPY: Systemic chemotherapy with carboplatin for AUC of 5 and etoposide 100 Mg/M2 on days 1, 2 and 3 every 3 weeks.  First dose started 05/02/2021.  Status post 4 cycles.  This is concurrent with radiotherapy under the care of Dr. Tammi Klippel.  CURRENT THERAPY: Observation  INTERVAL HISTORY: Kelly Olson. 71 y.o. male returns to the clinic today for follow-up visit accompanied by his wife.  The patient is feeling fine today with no concerning complaints except for weight loss of around 10 pounds.  He was also treated with a course of Z-Pak by his primary care physician for suspicious pneumonia in the left lung.  He is also complaining of left back pain and had x-ray 2 weeks ago that were unremarkable.  The patient was doing routine wellness checkup and was found to have low sodium.  He was admitted to the hospital and treated by nephrology with Tolvaptan and currently on salt tablets.  His sodium has improved.  There was some concern about possibility of disease recurrence and he presented to the clinic today for evaluation and recommendation regarding his condition.  He denied having any current nausea, vomiting, diarrhea or constipation.  He has no headache or visual changes.   MEDICAL HISTORY: Past Medical History:  Diagnosis Date   Coronary artery disease, occlusive 06/02/2014   Multivessel CAD.mLAD-100%, mRCA 99%, dRCA 100%, mLCX 90%  And EF 35-45%. -->  Referred for CABG; nonischemic Myoview March 2020   Former heavy tobacco smoker    Quit in April 2016    GERD  (gastroesophageal reflux disease)    Hyperlipidemia with target LDL less than 70    Hypertension    Ischemic cardiomyopathy - resolved 05/2014   Myoview: EF ~33% with "infarction vs. severe resting ischemia in LAD & RCA territory; b) EF by Cath: 35-45%. 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   Stage T2 a, N0    ALLERGIES:  has No Known Allergies.  MEDICATIONS:  Current Outpatient Medications  Medication Sig Dispense Refill   acetaminophen (TYLENOL) 325 MG tablet Take 650 mg by mouth every 6 (six) hours as needed for moderate pain.     albuterol (VENTOLIN HFA) 108 (90 Base) MCG/ACT inhaler Inhale 2 puffs into the lungs every 6 (six) hours as needed.     aspirin 81 MG tablet Take 81 mg by mouth daily.     atorvastatin (LIPITOR) 40 MG tablet TAKE 1 TABLET AT BEDTIME (Patient taking differently: Take 40 mg by mouth daily.) 90 tablet 3   carvedilol (COREG) 3.125 MG tablet TAKE 2 TABLETS TWICE A DAY WITH     MEALS (Patient taking differently: Take 6.25 mg by mouth 2 (two) times daily with a meal.) 360 tablet 2   [START ON 01/03/2022]  losartan (COZAAR) 25 MG tablet Take 1 tablet (25 mg total) by mouth daily. 90 tablet 3   metFORMIN (GLUCOPHAGE-XR) 500 MG 24 hr tablet Take 500 mg by mouth 2 (two) times daily.     prochlorperazine (COMPAZINE) 10 MG tablet Take 1 tablet (10 mg total) by mouth every 6 (six) hours as needed for nausea or vomiting. (Patient not taking: Reported on 06/14/2021) 30 tablet 0   Pseudoephedrine-APAP 30-325 MG TABS Take 1 tablet by mouth daily as needed (sinus congestion).     sildenafil (VIAGRA) 50 MG tablet Take 1 tablet (50 mg total) by mouth daily as needed for erectile dysfunction. 10 tablet 5   sucralfate  (CARAFATE) 1 g tablet Take 1 tablet (1 g total) by mouth 4 (four) times daily -  with meals and at bedtime. 5 min before meals for radiation induced esophagitis (Patient not taking: Reported on 11/08/2021) 120 tablet 2   urea (URE-NA) 15 g PACK oral packet Take 15 g by mouth 2 (two) times daily. 60 packet 1   No current facility-administered medications for this visit.    SURGICAL HISTORY:  Past Surgical History:  Procedure Laterality Date   CARDIAC CATHETERIZATION N/A 06/02/2014   Procedure: Left Heart Cath and Coronary Angiography;  Surgeon: Leonie Man, MD;  Location: [facility] INVASIVE CV LAB CUPID;  Service: Cardiovascular;  mLAD-100%, mRCA 99%, dRCA 100%, mLCX 90%  And EF 35-45%.   CORONARY ARTERY BYPASS GRAFT N/A 06/04/2014   Procedure: CORONARY ARTERY BYPASS GRAFTING (CABG) times four using bilateral internal mammary arteries and EVH for left  leg saphenous vein;  Surgeon: Gaye Pollack, MD;  Location: [facility];  Service: Open Heart Surgery;  LIMA-OM, RIMA-LAD, SVG-Diag, SVG-rPDA   IR IMAGING GUIDED PORT INSERTION  05/16/2021   NM MYOVIEW LTD  05/28/2014   Pre-CABG:  High Risk Nuclear Stress Test with multivessel distribution ischemia.  -- Severely ischemic Cardiomyoapthy with evidence of at least 2 vessel disease & EF of ~33%.  Images are consistent with either infarction or severe resting ischemia in the LAD & RCA territory.   NM MYOVIEW LTD  03/2018   EF 50-55%.  Small basal inferior fixed defect consistent with diaphragmatic attenuation.  No ischemia or infarction.  Septal dyskinesis due to post CABG state   TEE WITHOUT CARDIOVERSION N/A 06/04/2014   Procedure: TRANSESOPHAGEAL ECHOCARDIOGRAM (TEE);  Surgeon: Gaye Pollack, MD;  Location: [facility];  Service: Open Heart Surgery;  Laterality: N/A;   THORACOTOMY Right 07/10/2016   Procedure: THORACOTOMY;  Surgeon: Gaye Pollack, MD;  Location: [facility] OR;  Service: Thoracic;  Laterality: Right;   THORACOTOMY/LOBECTOMY Right 07/29/2018   Procedure:  THORACOTOMY/RIGHT LOWER LOBECTOMY;  Surgeon: Gaye Pollack, MD;  Location: [facility] OR;  Service: Thoracic;  Laterality: Right;   [facility] ECHOCARDIOGRAM  10/2014   Mild concentric LVH. EF 50-55% with mild HK of basal anteroseptal myocardium. GR 1 DD.   VIDEO ASSISTED THORACOSCOPY (VATS)/WEDGE RESECTION Right 07/10/2016   Procedure: VIDEO ASSISTED THORACOSCOPY (VATS)/WEDGE RESECTION;  Surgeon: Gaye Pollack, MD;  Location: [facility];  Service: Thoracic;  Laterality: Right;   VIDEO BRONCHOSCOPY WITH ENDOBRONCHIAL NAVIGATION N/A 04/13/2021   Procedure: VIDEO BRONCHOSCOPY WITH ENDOBRONCHIAL NAVIGATION;  Surgeon: Melrose Nakayama, MD;  Location: [facility] OR;  Service: Thoracic;  Laterality: N/A;    REVIEW OF SYSTEMS:  Constitutional: positive for fatigue and weight loss Eyes: negative Ears, nose, mouth, throat, and face: negative Respiratory: positive for cough and dyspnea on exertion  Cardiovascular: negative Gastrointestinal: negative Genitourinary:negative Integument/breast: negative Hematologic/lymphatic: negative Musculoskeletal:negative Neurological: negative Behavioral/Psych: negative Endocrine: negative Allergic/Immunologic: negative   PHYSICAL EXAMINATION: General appearance: alert, cooperative, fatigued, and no distress Head: Normocephalic, without obvious abnormality, atraumatic Neck: no adenopathy, no JVD, supple, symmetrical, trachea midline, and thyroid not enlarged, symmetric, no tenderness/mass/nodules Lymph nodes: Cervical, supraclavicular, and axillary nodes normal. Resp: clear to auscultation bilaterally Back: symmetric, no curvature. ROM normal. No CVA tenderness. Cardio: regular rate and rhythm, S1, S2 normal, no murmur, click, rub or gallop GI: soft, non-tender; bowel sounds normal; no masses,  no organomegaly Extremities: extremities normal, atraumatic, no cyanosis or edema Neurologic: Alert and oriented X 3, normal strength and tone. Normal  symmetric reflexes. Normal coordination and gait  ECOG PERFORMANCE STATUS: 1 - Symptomatic but completely ambulatory  Blood pressure 138/79, pulse 84, temperature 98 F (36.7 C), temperature source Oral, resp. rate 16, weight 148 lb 8 oz (67.4 kg), SpO2 94 %.  LABORATORY DATA: Lab Results  Component Value Date   WBC 9.5 01/02/2022   HGB 14.8 01/02/2022   HCT 45.0 01/02/2022   MCV 98.5 01/02/2022   PLT 211 01/02/2022      Chemistry      Component Value Date/Time   NA 128 (L) 12/20/2021 0114   NA 142 04/08/2018 0858   K 3.5 12/20/2021 0114   CL 90 (L) 12/20/2021 0114   CO2 24 12/20/2021 0114   BUN 44 (H) 12/20/2021 0114   BUN 24 04/08/2018 0858   CREATININE 0.77 12/20/2021 0114   CREATININE 0.75 11/04/2021 0819   CREATININE 0.85 01/03/2016 0840      Component Value Date/Time   CALCIUM 9.8 12/20/2021 0114   ALKPHOS 79 12/16/2021 1751   AST 26 12/16/2021 1751   AST 18 11/04/2021 0819   ALT 22 12/16/2021 1751   ALT 16 11/04/2021 0819   BILITOT 0.7 12/16/2021 1751   BILITOT 0.5 11/04/2021 0819       RADIOGRAPHIC STUDIES: DG Ribs Unilateral W/Chest Left  Result Date: 12/15/2021 CLINICAL DATA:  Left-sided rib pain. Cough. History of lung cancer EXAM: LEFT RIBS AND CHEST - 3+ VIEW COMPARISON:  Chest radiograph 04/16/2021, chest CT 11/04/2021 FINDINGS: Prior median sternotomy. Right chest port in place. Hyperinflation with emphysema. There is increasing ill-defined and patchy opacity in the left lower lobe. Pulmonary nodules on CT are not well seen by radiograph. Suspect small bilateral pleural effusions. No rib fracture or evidence of focal rib abnormality. Cortical margins of the left ribs are intact. No obvious bony destruction. IMPRESSION: 1. No left rib fracture or focal rib abnormality. 2. Increasing ill-defined and patchy opacity in the left lower lobe, assessed with CT last month. While findings may represent evolving post radiation change, recommend close clinical and  radiologic follow-up as the possibility of superimposed infection could have a similar appearance. Electronically Signed   By: Keith Rake M.D.   On: 12/15/2021 11:27    ASSESSMENT AND PLAN: This is a very pleasant 72 years old white male diagnosed with unresectable limited stage (T3, N0, M0 small cell lung cancer presented with 2 nodules in the left lower lobe with no evidence of metastatic disease to the thoracic lymph nodes or distant metastases diagnosed in March 2023. The patient underwent a course of systemic chemotherapy with carboplatin for AUC of 5 on day 1 and etoposide 100 Mg/M2 on days 1, 2 and 3 status post 4 cycles.  This is concurrent with radiotherapy.   This was followed by prophylactic cranial irradiation  under the care of Dr. Tammi Klippel. He is currently on observation and doing fine but recently was found to have significant hyponatremia and there was some concern about possibility of disease recurrence. I had a lengthy discussion with the patient and his wife today about his condition and further investigation to rule out any disease recurrence. I will order a PET scan as well as MRI of the brain to evaluate his condition especially with the hyponatremia and significant weight loss. For the hyponatremia his sodium is better today and he will continue his routine follow-up visit and evaluation by nephrology. Regarding his diabetes mellitus, he is supposed to see Dr. Milinda Hirschfeld in few months for management of his condition after Dr. Laurann Montana retired. I will see the patient back for follow-up visit in 2 weeks for evaluation and discussion of his scan results and further recommendation regarding his condition. The patient was advised to call immediately if he has any concerning symptoms in the interval. The patient voices understanding of current disease status and treatment options and is in agreement with the current care plan.  All questions were answered. The patient knows to call the  clinic with any problems, questions or concerns. We can certainly see the patient much sooner if necessary.  The total time spent in the appointment was 30 minutes.  Disclaimer: This note was dictated with voice recognition software. Similar sounding words can inadvertently be transcribed and may not be corrected upon review.

## 2022-01-03 DIAGNOSIS — C3492 Malignant neoplasm of unspecified part of left bronchus or lung: Secondary | ICD-10-CM | POA: Diagnosis not present

## 2022-01-03 DIAGNOSIS — E785 Hyperlipidemia, unspecified: Secondary | ICD-10-CM | POA: Diagnosis not present

## 2022-01-03 DIAGNOSIS — I10 Essential (primary) hypertension: Secondary | ICD-10-CM | POA: Diagnosis not present

## 2022-01-03 DIAGNOSIS — E871 Hypo-osmolality and hyponatremia: Secondary | ICD-10-CM | POA: Diagnosis not present

## 2022-01-03 DIAGNOSIS — E119 Type 2 diabetes mellitus without complications: Secondary | ICD-10-CM | POA: Diagnosis not present

## 2022-01-03 DIAGNOSIS — I2510 Atherosclerotic heart disease of native coronary artery without angina pectoris: Secondary | ICD-10-CM | POA: Diagnosis not present

## 2022-01-08 ENCOUNTER — Ambulatory Visit (HOSPITAL_COMMUNITY)
Admission: RE | Admit: 2022-01-08 | Discharge: 2022-01-08 | Disposition: A | Discharge status: Home / Self Care | Payer: Medicare Other | Source: Ambulatory Visit | Attending: Internal Medicine | Admitting: Internal Medicine

## 2022-01-08 DIAGNOSIS — C3490 Malignant neoplasm of unspecified part of unspecified bronchus or lung: Secondary | ICD-10-CM | POA: Diagnosis not present

## 2022-01-08 MED ORDER — GADOBUTROL 1 MMOL/ML IV SOLN
8.0000 mL | Freq: Once | INTRAVENOUS | Status: AC | PRN
  Administered 2022-01-08: 7 mL via INTRAVENOUS

## 2022-01-09 ENCOUNTER — Encounter: Payer: Self-pay | Admitting: Cardiology

## 2022-01-09 ENCOUNTER — Ambulatory Visit: Payer: Medicare Other | Attending: Cardiology | Admitting: Cardiology

## 2022-01-09 VITALS — BP 122/72 | HR 52 | Ht 71.0 in | Wt 148.2 lb

## 2022-01-09 DIAGNOSIS — I2510 Atherosclerotic heart disease of native coronary artery without angina pectoris: Secondary | ICD-10-CM | POA: Diagnosis not present

## 2022-01-09 DIAGNOSIS — I10 Essential (primary) hypertension: Secondary | ICD-10-CM | POA: Diagnosis not present

## 2022-01-09 DIAGNOSIS — E871 Hypo-osmolality and hyponatremia: Secondary | ICD-10-CM | POA: Insufficient documentation

## 2022-01-09 DIAGNOSIS — I255 Ischemic cardiomyopathy: Secondary | ICD-10-CM | POA: Insufficient documentation

## 2022-01-09 DIAGNOSIS — E785 Hyperlipidemia, unspecified: Secondary | ICD-10-CM | POA: Insufficient documentation

## 2022-01-09 NOTE — Patient Instructions (Signed)
Medication Instructions:  No changes  *If you need a refill on your cardiac medications before your next appointment, please call your pharmacy*   Lab Work:  Not needed   Testing/Procedures:  Not needed  Follow-Up: At [facility], you and your health needs are our priority.  As part of our continuing mission to provide you with exceptional heart care, we have created designated Provider Care Teams.  These Care Teams include your primary Cardiologist (physician) and Advanced Practice Providers (APPs -  Physician Assistants and Nurse Practitioners) who all work together to provide you with the care you need, when you need it.     Your next appointment:   5 month(s)  The format for your next appointment:   In Person  Provider:   Glenetta Hew, MD  or Jory Sims, DNP, ANP

## 2022-01-09 NOTE — Progress Notes (Signed)
Primary Care Provider: Wenda Low, [place] Cardiologist: Glenetta Hew, MD Electrophysiologist: None  Clinic Note: Chief Complaint  Patient presents with   Follow-up    6 months.  Doing well from a car standpoint.  Lots of other issues.   Coronary Artery Disease    No active angina or heart failure.   ===================================  ASSESSMENT/PLAN   Problem List Items Addressed This Visit       Cardiology Problems   Coronary artery disease involving native coronary artery without angina pectoris - Primary (Chronic)    Had previously been very active until his recent diagnosis of recurrent lung cancer. 7-1/2 years out from CABG with no recurrent angina or heart failure symptoms. Nonischemic Myoview was in 2020.  Should be due for follow-up Myoview as early as next June, but can wait until his cancer treatments are completed.  Plan: Continue current low-dose of carvedilol and losartan. Continue 81 mg aspirin maintenance along with 40 mg atorvastatin. Aspirin can be held 5 to 7 days preop for surgery or procedures.      Hyperlipidemia with target LDL less than 70 (Chronic)    Most recent lipids from June-PCP showed LDL of 53.  Well-controlled on current dose of atorvastatin 40 mg daily.  No change.      Cardiomyopathy, ischemic-resolved: EF ~35-45% by LV Gram --> 50 and 55% by echo (Chronic)    Notably improved EF following CABG.  Has not had any heart failure symptoms to speak of since CABG.  Remains on stable low-dose carvedilol and losartan. Euvolemic.  No diuretic requirement. Tolerated IV fluids, tolvaptan and PO Urea therapy for hyponatremia.      Essential hypertension (Chronic)    Stable BP on current meds.  No change.  Currently taking 6.25 mg twice daily of carvedilol along with 25 mg daily of losartan.        Other   Hyponatremia (Chronic)    Follow-up sodium on December 4 was 139.  Potassium was stable at 4.5.        ===================================  HPI:    Kelly Olson. is a 71 y.o. male with a PMH below who presents today for 31-month follow-up at the request of [name], MD.   DIAGNOSED WITH MV CAD IN MAY 2016 Initial presented with SSx c/w Class II-III Angina --> (had previously been walking several miles daily) High Risk Myoview (April 2016) -> Cath Cath with MV CAD (100% LAD, 99% mRCA-100% dRCA & 90% p-m Cx) w/ EF 35-45% --> CABG CABG: LIMA-OM, RIMA-LAD, SVG-Diag, SVG-rPDA (Dr. Cyndia Bent) Repeat Echo: EF 50-55% Myoview 03/2018 - No Ischemia or Infarct 50-55% - LOW RISK 07/10/16: s/p R thoracotomy for RLL nodule wedge resection -- Squamous Cell CA.(Dr. Cyndia Bent) - negative margins. - complicated by air leak (Sub Cu emphysema - redo chest tube x 2) 07/29/2018: Right Lower Lobectomy via Right Thoracotomy (Dr. Cyndia Bent for + Pet CT showing recurrent SS Lung Ca (Stage T2a, N0)  --> not complicated by Sub Cu emphysema this time. 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 was last seen on July 29, 2021 by Jory Sims, NP for an annual follow-up (I had last seen him in June 2022-he was active playing golf 2 to 3 days a week 18 holes without any chest pain or dyspnea.).  He was in the process of treatment and further evaluation for his recently diagnosed small cell carcinoma.  Was in good attitude.  No angina.  Mild exertional dyspnea stable since his surgeries.  No syncope near syncope or dizziness.  Diuresis.  Energy level doing well just not active. => No medication changes.  2D echo ordered just for baseline assessment.  Recent Hospitalizations:  11/17/-212023: Admitted for hyponatremia _> sodium level was 127 at PCPs office was at the ER is 123.  Likely euvolemic.  Fluid restricted.  Started on tolvaptan M and urea.  Sodium is up to 128 on discharge.  Resume Kayexalate bicarb for acidosis in setting of  hyperkalemia. Final XRT therapy on 09/09/2021  Most recent oncology visit was October 10 with Dr. Julien Nordmann -> feeling well.  No major concerning symptoms.  Just mild cough and dyspnea.  No chest pain or hemoptysis.  CT scan showed no clear evidence of disease progression.  Evolving postradiation changes with resolution of the large pulmonary nodules in the posterior aspect of the left lower lobe.  Plan for 33-month reassessment.  Reviewed  CV studies:    The following studies were reviewed today: (if available, images/films reviewed: From Epic Chart or Care Everywhere) Echo 08/10/2021: EF 60 to 65%.  GR 1 DD.  No RWMA.  Normal RV size and function.  Normal PAP.  Normal aortic and mitral valves.  Normal RAP.  Conclusion(s)/Recommendation(s): Normal biventricular function without evidence of hemodynamically significant valvular heart disease.   Interval History:   [name]. returns here today along with his wife.  He is doing remarkably well overall from cardiac standpoint.  He is went through the litany of procedures that he did not through with his small cell cancer now.  He had 33 treatments of radiation and 12 weeks of chemotherapy.  He then had 10 more XRT treatments to the head for head mets. He says that notably he is not as energetic as he had been, but is finally starting to get a little bit better.  He is breathing better overall now.  Still not back to his baseline.  He says his blood pressures have been gotten little bit low but now they have been stable in the 120s to 130 mm range.  Thankfully, he has not had any recurrent angina or heart failure symptoms.  He was treated with IV fluids for hyponatremia and had no signs or symptoms of heart failure.  He is really helping to get back into doing exercise.  Has not been back out playing golf yet.  CV Review of Symptoms (Summary): positive for - dyspnea on exertion and somewhat deconditioned with decreased energy level but  improving. negative for - chest pain, edema, irregular heartbeat, orthopnea, palpitations, paroxysmal nocturnal dyspnea, rapid heart rate, or now he is no longer having therapy, he denies any lightheadedness or dizziness.  No syncope or near syncope.  No TIA or amaurosis fugax.  No claudication.  REVIEWED OF SYSTEMS   Review of Systems  Constitutional:  Positive for malaise/fatigue (Overall admission level improving, but still seems little bit deconditioned and weak.) and weight loss (Try to get his appetite back).  HENT:  Negative for congestion.   Respiratory:  Positive for cough, shortness of breath (Improving.  Not yet back to baseline.) and wheezing.   Cardiovascular:        Per HPI  Gastrointestinal:  Negative for blood in stool and melena.  Genitourinary:  Negative for hematuria.  Musculoskeletal:  Positive for joint pain. Negative for back pain.  Neurological:  Positive for dizziness (Getting better now that  he is blood pressure is stable.) and weakness (Global). Negative for focal weakness.  Psychiatric/Behavioral:  Negative for depression (Remains in great spirits.  Positive attitude) and memory loss. The patient is not nervous/anxious and does not have insomnia.     I have reviewed and (if needed) personally updated the patient's problem list, medications, allergies, past medical and surgical history, social and family history.   PAST MEDICAL HISTORY   Past Medical History:  Diagnosis Date   Coronary artery disease, occlusive 06/02/2014   Multivessel CAD.mLAD-100%, mRCA 99%, dRCA 100%, mLCX 90%  And EF 35-45%. -->  Referred for CABG; nonischemic Myoview March 2020   Former heavy tobacco smoker    Quit in April 2016    GERD (gastroesophageal reflux disease)    Hyperlipidemia with target LDL less than 70    Hypertension    Ischemic cardiomyopathy - resolved 05/2014   Myoview: EF ~33% with "infarction vs. severe resting ischemia in LAD & RCA territory; b) EF by Cath: 35-45%.  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   Stage T2 a, N0    PAST SURGICAL HISTORY   Past Surgical History:  Procedure Laterality Date   CARDIAC CATHETERIZATION N/A 06/02/2014   Procedure: Left Heart Cath and Coronary Angiography;  Surgeon: Leonie Man, MD;  Location: [facility] INVASIVE CV LAB CUPID;  Service: Cardiovascular;  mLAD-100%, mRCA 99%, dRCA 100%, mLCX 90%  And EF 35-45%.   CORONARY ARTERY BYPASS GRAFT N/A 06/04/2014   Procedure: CORONARY ARTERY BYPASS GRAFTING (CABG) times four using bilateral internal mammary arteries and EVH for left  leg saphenous vein;  Surgeon: Gaye Pollack, MD;  Location: [place] OR;  Service: Open Heart Surgery;  LIMA-OM, RIMA-LAD, SVG-Diag, SVG-rPDA   IR IMAGING GUIDED PORT INSERTION  05/16/2021   NM MYOVIEW LTD  05/28/2014   Pre-CABG:  High Risk Nuclear Stress Test with multivessel distribution ischemia.  -- Severely ischemic Cardiomyoapthy with evidence of at least 2 vessel disease & EF of ~33%.  Images are consistent with either infarction or severe resting ischemia in the LAD & RCA territory.   NM MYOVIEW LTD  03/2018   EF 50-55%.  Small basal inferior fixed defect consistent with diaphragmatic attenuation.  No ischemia or infarction.  Septal dyskinesis due to post CABG state   TEE WITHOUT CARDIOVERSION N/A 06/04/2014   Procedure: TRANSESOPHAGEAL ECHOCARDIOGRAM (TEE);  Surgeon: Gaye Pollack, MD;  Location: [facility];  Service: Open Heart Surgery;  Laterality: N/A;   THORACOTOMY Right 07/10/2016   Procedure: THORACOTOMY;  Surgeon: Gaye Pollack, MD;  Location: [facility] OR;  Service: Thoracic;  Laterality: Right;   THORACOTOMY/LOBECTOMY Right 07/29/2018   Procedure: THORACOTOMY/RIGHT LOWER  LOBECTOMY;  Surgeon: Gaye Pollack, MD;  Location: [place];  Service: Thoracic;  Laterality: Right;   [place] ECHOCARDIOGRAM  08/10/2021   EF 60 to 65%.  GR 1 DD.  No RWMA.  Normal RV size and function.  Normal PAP.  Normal aortic and mitral valves.  Normal RAP.  Conclusion(s)/Recommendation(s): Normal biventricular function withoutevidence of hemodynamically significant valvular heart disease. . EF 50-55% with mild HK of basal anteroseptal myocardium. GR 1 DD.   VIDEO ASSISTED  THORACOSCOPY (VATS)/WEDGE RESECTION Right 07/10/2016   Procedure: VIDEO ASSISTED THORACOSCOPY (VATS)/WEDGE RESECTION;  Surgeon: Gaye Pollack, MD;  Location: [facility];  Service: Thoracic;  Laterality: Right;   VIDEO BRONCHOSCOPY WITH ENDOBRONCHIAL NAVIGATION N/A 04/13/2021   Procedure: VIDEO BRONCHOSCOPY WITH ENDOBRONCHIAL NAVIGATION;  Surgeon: Melrose Nakayama, MD;  Location: [facility] OR;  Service: Thoracic;  Laterality: N/A;    Immunization History  Administered Date(s) Administered   COVID-19, mRNA, vaccine(Comirnaty)12 years and older 10/20/2021   Fluad Quad(high Dose 65+) 10/20/2021   Influenza Split 11/05/2014, 10/29/2017   Influenza, High Dose Seasonal PF 11/01/2016, 10/29/2017, 09/20/2018   Moderna Sars-Covid-2 Vaccination 05/05/2020   PFIZER(Purple Top)SARS-COV-2 Vaccination 03/06/2019, 04/01/2019, 04/21/2019, 10/27/2019   Pneumococcal Conjugate-13 05/27/2015, 09/20/2018   Pneumococcal Polysaccharide-23 03/01/2012, 06/19/2019   Td 01/31/2003   Tdap 04/08/2013   Zoster, Live 02/28/2011, 06/06/2016, 09/20/2016    MEDICATIONS/ALLERGIES   Current Meds  Medication Sig   albuterol (VENTOLIN HFA) 108 (90 Base) MCG/ACT inhaler Inhale 2 puffs into the lungs every 6 (six) hours as needed for shortness of breath or wheezing.   aspirin 81 MG tablet Take 81 mg by mouth daily.   atorvastatin (LIPITOR) 40 MG tablet TAKE 1 TABLET AT BEDTIME (Patient taking differently: Take 40 mg by mouth at  bedtime.)   carvedilol (COREG) 3.125 MG tablet TAKE 2 TABLETS TWICE A DAY WITH     MEALS (Patient taking differently: Take 6.25 mg by mouth 2 (two) times daily with a meal.)   losartan (COZAAR) 25 MG tablet Take 1 tablet (25 mg total) by mouth daily.   metFORMIN (GLUCOPHAGE-XR) 500 MG 24 hr tablet Take 500 mg by mouth 2 (two) times daily.   Pseudoephedrine-APAP 30-325 MG TABS Take 1 tablet by mouth daily as needed (sinus congestion).   sildenafil (VIAGRA) 50 MG tablet Take 1 tablet (50 mg total) by mouth daily as needed for erectile dysfunction.   [DISCONTINUED] acetaminophen (TYLENOL) 325 MG tablet Take 650 mg by mouth every 6 (six) hours as needed for moderate pain.   [DISCONTINUED] urea (URE-NA) 15 g PACK oral packet Take 15 g by mouth 2 (two) times daily. (Patient not taking: Reported on 01/28/2022)    No Known Allergies  SOCIAL HISTORY/FAMILY HISTORY   Reviewed in Epic:  Pertinent findings:  Social History   Tobacco Use   Smoking status: Former    Packs/day: 0.80    Years: 46.00    Total pack years: 36.80    Types: Cigarettes    Quit date: 05/29/2014    Years since quitting: 7.6   Smokeless tobacco: Never  Vaping Use   Vaping Use: Never used  Substance Use Topics   Alcohol use: Yes    Alcohol/week: 7.0 standard drinks of alcohol    Types: 7 Glasses of wine per week   Drug use: No   Social History   Social History Narrative   Wife recently diagnosed with early stage breast cancer    OBJCTIVE -PE, EKG, labs   Wt Readings from Last 3 Encounters:  01/27/22 150 lb (68 kg)  01/25/22 146 lb 4 oz (66.3 kg)  01/24/22 146 lb 4 oz (66.3 kg)    Physical Exam: BP 122/72 (BP Location: Left Arm, Patient Position: Sitting, Cuff Size: Normal)   Pulse (!) 52   Ht 5\' 11"  (1.803 m)   Wt 148 lb 3.2 oz (67.2 kg)   SpO2 92%   BMI 20.67 kg/m  Physical Exam Vitals reviewed.  Constitutional:      General: He  is not in acute distress.    Appearance: Normal appearance. He is  normal weight. He is not ill-appearing (Looks remarkably healthy having gone through extensive cancer treatment.) or toxic-appearing.     Comments: Well-groomed.  Notably lighter.  HENT:     Head: Normocephalic and atraumatic.  Eyes:     Comments: Exophthalmos  Neck:     Vascular: No carotid bruit.  Cardiovascular:     Rate and Rhythm: Normal rate and regular rhythm.     Pulses: Normal pulses.     Heart sounds: Normal heart sounds. No murmur heard.    No friction rub. No gallop.  Pulmonary:     Effort: Pulmonary effort is normal. No respiratory distress.     Breath sounds: Wheezing (Mild expiratory) present.     Comments: Right lung field coarse breath sounds and diminished.  Left lower lobe crackles but no rales or rhonchi. Chest:     Chest wall: No tenderness.  Musculoskeletal:        General: No swelling. Normal range of motion.     Cervical back: Normal range of motion and neck supple.  Skin:    General: Skin is warm and dry.     Coloration: Skin is not jaundiced.  Neurological:     General: No focal deficit present.     Mental Status: He is alert and oriented to person, place, and time.     Gait: Gait normal.  Psychiatric:        Mood and Affect: Mood normal.        Behavior: Behavior normal.        Thought Content: Thought content normal.        Judgment: Judgment normal.     Comments: In remarkably good spirits.     Adult ECG Report Not checked  Recent Labs:   07/19/2021: TC 123, TG 90, HDL 52, LDL 53.  Lab Results  Component Value Date   CREATININE 0.69 01/29/2022   BUN 32 (H) 01/29/2022   NA 143 01/29/2022   K 3.2 (L) 01/29/2022   CL 98 01/29/2022   CO2 36 (H) 01/29/2022      Latest Ref Rng & Units 01/29/2022    2:14 AM 01/28/2022    3:30 AM 01/27/2022    3:47 AM  CBC  WBC 4.0 - 10.5 K/uL 10.0  11.1  10.7   Hemoglobin 13.0 - 17.0 g/dL 11.6  12.2  11.6   Hematocrit 39.0 - 52.0 % 36.1  37.2  35.0   Platelets 150 - 400 K/uL 80  91  85     Lab  Results  Component Value Date   HGBA1C 6.0 (H) 06/03/2014   Lab Results  Component Value Date   TSH 0.548 01/25/2022    ================================================== I spent a total of 22 minutes with the patient spent in direct patient consultation.  Additional time spent with chart review  / charting (studies, outside notes, etc): 15 min Total Time: 37 min  Current medicines are reviewed at length with the patient today.  (+/- concerns) none  Notice: This dictation was prepared with Dragon dictation along with smart phrase technology. Any transcriptional errors that result from this process are unintentional and may not be corrected upon review.  Studies Ordered:   No orders of the defined types were placed in this encounter.  No orders of the defined types were placed in this encounter.   Patient Instructions / Medication Changes & Studies & Tests Ordered  Patient Instructions  Medication Instructions:  No changes  *If you need a refill on your cardiac medications before your next appointment, please call your pharmacy*   Lab Work:  Not needed   Testing/Procedures:  Not needed  Follow-Up: At [facility], you and your health needs are our priority.  As part of our continuing mission to provide you with exceptional heart care, we have created designated Provider Care Teams.  These Care Teams include your primary Cardiologist (physician) and Advanced Practice Providers (APPs -  Physician Assistants and Nurse Practitioners) who all work together to provide you with the care you need, when you need it.     Your next appointment:   5 month(s)  The format for your next appointment:   In Person  Provider:   Glenetta Hew, MD  or Jory Sims, DNP, ANP          Leonie Man, MD, MS Glenetta Hew, M.D., M.S. Interventional Cardiologist  [place]  Pager # [phone] Phone # [phone] [address]. [place], [place] [address]   Thank you for choosing [facility]!!

## 2022-01-10 ENCOUNTER — Inpatient Hospital Stay: Payer: Medicare Other

## 2022-01-11 DIAGNOSIS — J449 Chronic obstructive pulmonary disease, unspecified: Secondary | ICD-10-CM | POA: Diagnosis not present

## 2022-01-11 DIAGNOSIS — E119 Type 2 diabetes mellitus without complications: Secondary | ICD-10-CM | POA: Diagnosis not present

## 2022-01-11 DIAGNOSIS — I10 Essential (primary) hypertension: Secondary | ICD-10-CM | POA: Diagnosis not present

## 2022-01-12 ENCOUNTER — Ambulatory Visit (HOSPITAL_COMMUNITY)
Admission: RE | Admit: 2022-01-12 | Discharge: 2022-01-12 | Disposition: A | Discharge status: Home / Self Care | Payer: Medicare Other | Source: Ambulatory Visit | Attending: Internal Medicine | Admitting: Internal Medicine

## 2022-01-12 DIAGNOSIS — C3492 Malignant neoplasm of unspecified part of left bronchus or lung: Secondary | ICD-10-CM | POA: Diagnosis not present

## 2022-01-12 DIAGNOSIS — K769 Liver disease, unspecified: Secondary | ICD-10-CM | POA: Diagnosis not present

## 2022-01-12 DIAGNOSIS — C7951 Secondary malignant neoplasm of bone: Secondary | ICD-10-CM | POA: Diagnosis not present

## 2022-01-12 DIAGNOSIS — C3490 Malignant neoplasm of unspecified part of unspecified bronchus or lung: Secondary | ICD-10-CM | POA: Diagnosis not present

## 2022-01-12 DIAGNOSIS — R918 Other nonspecific abnormal finding of lung field: Secondary | ICD-10-CM | POA: Diagnosis not present

## 2022-01-12 DIAGNOSIS — R911 Solitary pulmonary nodule: Secondary | ICD-10-CM | POA: Diagnosis not present

## 2022-01-12 DIAGNOSIS — J439 Emphysema, unspecified: Secondary | ICD-10-CM | POA: Diagnosis not present

## 2022-01-12 LAB — GLUCOSE, CAPILLARY: Glucose-Capillary: 104 mg/dL — ABNORMAL HIGH (ref 70–99)

## 2022-01-12 MED ORDER — FLUDEOXYGLUCOSE F - 18 (FDG) INJECTION
7.3700 | Freq: Once | INTRAVENOUS | Status: AC | PRN
  Administered 2022-01-12: 7.37 via INTRAVENOUS

## 2022-01-16 ENCOUNTER — Inpatient Hospital Stay (HOSPITAL_BASED_OUTPATIENT_CLINIC_OR_DEPARTMENT_OTHER): Payer: Medicare Other | Admitting: Internal Medicine

## 2022-01-16 VITALS — BP 173/95 | HR 61 | Temp 97.6°F | Resp 18 | Wt 147.0 lb

## 2022-01-16 DIAGNOSIS — C7951 Secondary malignant neoplasm of bone: Secondary | ICD-10-CM | POA: Diagnosis not present

## 2022-01-16 DIAGNOSIS — E871 Hypo-osmolality and hyponatremia: Secondary | ICD-10-CM | POA: Diagnosis not present

## 2022-01-16 DIAGNOSIS — C3492 Malignant neoplasm of unspecified part of left bronchus or lung: Secondary | ICD-10-CM | POA: Diagnosis not present

## 2022-01-16 DIAGNOSIS — R634 Abnormal weight loss: Secondary | ICD-10-CM | POA: Diagnosis not present

## 2022-01-16 DIAGNOSIS — Z5111 Encounter for antineoplastic chemotherapy: Secondary | ICD-10-CM | POA: Diagnosis not present

## 2022-01-16 DIAGNOSIS — C7931 Secondary malignant neoplasm of brain: Secondary | ICD-10-CM | POA: Diagnosis not present

## 2022-01-16 DIAGNOSIS — C3432 Malignant neoplasm of lower lobe, left bronchus or lung: Secondary | ICD-10-CM | POA: Diagnosis not present

## 2022-01-16 NOTE — Progress Notes (Signed)
[place] [phone]   [phone]  OFFICE PROGRESS NOTE  Wenda Low, MD [address] [place] [address]  DIAGNOSIS: Recurrent/metastatic small cell lung cancer initially diagnosed as unresectable limited stage IIb (T3, N0, M0) small cell lung cancer presented with 2 pulmonary nodules in the left lower lobe with no evidence of metastatic disease to the lymph nodes or distant metastasis diagnosed in March 2023.  The patient has disease recurrence with metastasis to the left lung as well as bone and brain in December 2023.  PRIOR THERAPY: Systemic chemotherapy with carboplatin for AUC of 5 and etoposide 100 Mg/M2 on days 1, 2 and 3 every 3 weeks.  First dose started 05/02/2021.  Status post 4 cycles.  This is concurrent with radiotherapy under the care of Dr. Tammi Klippel.  CURRENT THERAPY: Systemic chemotherapy with carboplatin for AUC of 5 on day 1 and etoposide 100 Mg/M2 on days 1, 2 and 3 with Cosela 240 Mg/M2 on the days of the chemotherapy as well as Imfinzi 1500 Mg IV on day 1 every 3 weeks with the chemotherapy and then every 4 weeks as maintenance after the first 4 cycles.  First dose January 24, 2022.  INTERVAL HISTORY: Kelly Olson. 71 y.o. male returns to the clinic today for follow-up visit accompanied by his wife.  The patient is feeling fine except for pain on the left side of the chest as well as the right hip.  The patient denied having any shortness of breath, cough or hemoptysis.  He denied having any fever or chills.  He has no nausea, vomiting, diarrhea or constipation.  He has no headache or visual changes.  He denied having any recent weight loss or night sweats.  He had a PET scan as well as MRI of the brain performed recently and he is here for evaluation and discussion of his scan results and recommendation regarding treatment of his condition.   MEDICAL HISTORY: Past Medical History:  Diagnosis Date    Coronary artery disease, occlusive 06/02/2014   Multivessel CAD.mLAD-100%, mRCA 99%, dRCA 100%, mLCX 90%  And EF 35-45%. -->  Referred for CABG; nonischemic Myoview March 2020   Former heavy tobacco smoker    Quit in April 2016    GERD (gastroesophageal reflux disease)    Hyperlipidemia with target LDL less than 70    Hypertension    Ischemic cardiomyopathy - resolved 05/2014   Myoview: EF ~33% with "infarction vs. severe resting ischemia in LAD & RCA territory; b) EF by Cath: 35-45%. 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   Stage T2 a, N0    ALLERGIES:  has No Known Allergies.  MEDICATIONS:  Current Outpatient Medications  Medication Sig Dispense Refill   acetaminophen (TYLENOL) 325 MG tablet Take 650 mg by mouth every 6 (six) hours as needed for moderate pain.     albuterol (VENTOLIN HFA) 108 (90 Base) MCG/ACT inhaler Inhale 2 puffs into the lungs every 6 (six) hours as needed.     aspirin 81 MG tablet Take 81 mg by mouth daily.     atorvastatin (LIPITOR) 40 MG tablet TAKE 1  TABLET AT BEDTIME (Patient taking differently: Take 40 mg by mouth daily.) 90 tablet 3   carvedilol (COREG) 3.125 MG tablet TAKE 2 TABLETS TWICE A DAY WITH     MEALS (Patient taking differently: Take 6.25 mg by mouth 2 (two) times daily with a meal.) 360 tablet 2   losartan (COZAAR) 25 MG tablet Take 1 tablet (25 mg total) by mouth daily. 90 tablet 3   metFORMIN (GLUCOPHAGE-XR) 500 MG 24 hr tablet Take 500 mg by mouth 2 (two) times daily.     Pseudoephedrine-APAP 30-325 MG TABS Take 1 tablet by mouth daily as needed (sinus congestion).     sildenafil (VIAGRA) 50 MG tablet Take 1 tablet (50 mg total) by mouth daily as needed for erectile  dysfunction. 10 tablet 5   sucralfate (CARAFATE) 1 g tablet Take 1 tablet (1 g total) by mouth 4 (four) times daily -  with meals and at bedtime. 5 min before meals for radiation induced esophagitis 120 tablet 2   urea (URE-NA) 15 g PACK oral packet Take 15 g by mouth 2 (two) times daily. 60 packet 1   No current facility-administered medications for this visit.    SURGICAL HISTORY:  Past Surgical History:  Procedure Laterality Date   CARDIAC CATHETERIZATION N/A 06/02/2014   Procedure: Left Heart Cath and Coronary Angiography;  Surgeon: Leonie Man, MD;  Location: [facility] INVASIVE CV LAB CUPID;  Service: Cardiovascular;  mLAD-100%, mRCA 99%, dRCA 100%, mLCX 90%  And EF 35-45%.   CORONARY ARTERY BYPASS GRAFT N/A 06/04/2014   Procedure: CORONARY ARTERY BYPASS GRAFTING (CABG) times four using bilateral internal mammary arteries and EVH for left  leg saphenous vein;  Surgeon: Gaye Pollack, MD;  Location: [place] OR;  Service: Open Heart Surgery;  LIMA-OM, RIMA-LAD, SVG-Diag, SVG-rPDA   IR IMAGING GUIDED PORT INSERTION  05/16/2021   NM MYOVIEW LTD  05/28/2014   Pre-CABG:  High Risk Nuclear Stress Test with multivessel distribution ischemia.  -- Severely ischemic Cardiomyoapthy with evidence of at least 2 vessel disease & EF of ~33%.  Images are consistent with either infarction or severe resting ischemia in the LAD & RCA territory.   NM MYOVIEW LTD  03/2018   EF 50-55%.  Small basal inferior fixed defect consistent with diaphragmatic attenuation.  No ischemia or infarction.  Septal dyskinesis due to post CABG state   TEE WITHOUT CARDIOVERSION N/A 06/04/2014   Procedure: TRANSESOPHAGEAL ECHOCARDIOGRAM (TEE);  Surgeon: Gaye Pollack, MD;  Location: [place];  Service: Open Heart Surgery;  Laterality: N/A;   THORACOTOMY Right 07/10/2016   Procedure: THORACOTOMY;  Surgeon: Gaye Pollack, MD;  Location: [facility] OR;  Service: Thoracic;  Laterality: Right;   THORACOTOMY/LOBECTOMY Right 07/29/2018   Procedure:  THORACOTOMY/RIGHT LOWER LOBECTOMY;  Surgeon: Gaye Pollack, MD;  Location: [facility] OR;  Service: Thoracic;  Laterality: Right;   [place] ECHOCARDIOGRAM  10/2014   Mild concentric LVH. EF 50-55% with mild HK of basal anteroseptal myocardium. GR 1 DD.   VIDEO ASSISTED THORACOSCOPY (VATS)/WEDGE RESECTION Right 07/10/2016   Procedure: VIDEO ASSISTED THORACOSCOPY (VATS)/WEDGE RESECTION;  Surgeon: Gaye Pollack, MD;  Location: [facility] OR;  Service: Thoracic;  Laterality: Right;   VIDEO BRONCHOSCOPY WITH ENDOBRONCHIAL NAVIGATION N/A 04/13/2021   Procedure: VIDEO BRONCHOSCOPY WITH ENDOBRONCHIAL NAVIGATION;  Surgeon: Melrose Nakayama, MD;  Location: [facility] OR;  Service: Thoracic;  Laterality: N/A;    REVIEW OF SYSTEMS:  Constitutional: positive for fatigue Eyes: negative Ears, nose, mouth, throat,  and face: negative Respiratory: positive for pleurisy/chest pain Cardiovascular: negative Gastrointestinal: negative Genitourinary:negative Integument/breast: negative Hematologic/lymphatic: negative Musculoskeletal:positive for bone pain Neurological: negative Behavioral/Psych: negative Endocrine: negative Allergic/Immunologic: negative   PHYSICAL EXAMINATION: General appearance: alert, cooperative, fatigued, and no distress Head: Normocephalic, without obvious abnormality, atraumatic Neck: no adenopathy, no JVD, supple, symmetrical, trachea midline, and thyroid not enlarged, symmetric, no tenderness/mass/nodules Lymph nodes: Cervical, supraclavicular, and axillary nodes normal. Resp: clear to auscultation bilaterally Back: symmetric, no curvature. ROM normal. No CVA tenderness. Cardio: regular rate and rhythm, S1, S2 normal, no murmur, click, rub or gallop GI: soft, non-tender; bowel sounds normal; no masses,  no organomegaly Extremities: extremities normal, atraumatic, no cyanosis or edema Neurologic: Alert and oriented X 3, normal strength and tone. Normal symmetric  reflexes. Normal coordination and gait  ECOG PERFORMANCE STATUS: 1 - Symptomatic but completely ambulatory  Blood pressure (!) 173/95, pulse 61, temperature 97.6 F (36.4 C), temperature source Oral, resp. rate 18, weight 147 lb (66.7 kg), SpO2 91 %.  LABORATORY DATA: Lab Results  Component Value Date   WBC 9.5 01/02/2022   HGB 14.8 01/02/2022   HCT 45.0 01/02/2022   MCV 98.5 01/02/2022   PLT 211 01/02/2022      Chemistry      Component Value Date/Time   NA 139 01/02/2022 0759   NA 142 04/08/2018 0858   K 4.5 01/02/2022 0759   CL 103 01/02/2022 0759   CO2 28 01/02/2022 0759   BUN 32 (H) 01/02/2022 0759   BUN 24 04/08/2018 0858   CREATININE 0.75 01/02/2022 0759   CREATININE 0.85 01/03/2016 0840      Component Value Date/Time   CALCIUM 9.9 01/02/2022 0759   ALKPHOS 80 01/02/2022 0759   AST 25 01/02/2022 0759   ALT 25 01/02/2022 0759   BILITOT 0.7 01/02/2022 0759       RADIOGRAPHIC STUDIES: NM PET Image Restage (PS) Skull Base to Thigh (F-18 FDG)  Result Date: 01/13/2022 CLINICAL DATA:  Subsequent treatment strategy for small cell lung cancer. Therapy completed 07/09/2021. EXAM: NUCLEAR MEDICINE PET SKULL BASE TO THIGH TECHNIQUE: 7.37 mCi F-18 FDG was injected intravenously. Full-ring PET imaging was performed from the skull base to thigh after the radiotracer. CT data was obtained and used for attenuation correction and anatomic localization. Fasting blood glucose: 104 mg/dl COMPARISON:  Prior PET-CT 04/06/2021 and recent chest CT 11/04/2021 FINDINGS: Mediastinal blood pool activity: SUV max 1.91 Liver activity: SUV max NA NECK: No hypermetabolic lymph nodes in the neck. Incidental CT findings: Bilateral carotid artery calcifications. CHEST: New and enlarging parenchymal and pleural lesions involving the left hemithorax. 2 cm pleural lesion at the left lung base medially has an SUV max of 9.54. 15 mm pulmonary nodule at the left lung base has an SUV max of 8.81. Small  left-sided pleural nodule measuring 8 mm has an SUV max of 5.26. Other smaller hypermetabolic pleural and parenchymal nodules at the left lung base consistent with recurrent disease. No right-sided pleural or parenchymal hypermetabolic disease. No chest wall lesions, supraclavicular or axillary adenopathy. Incidental CT findings: Stable severe underlying emphysematous changes and pulmonary scarring. Stable advanced vascular disease. ABDOMEN/PELVIS: Small hypermetabolic foci are noted in the liver without definite correlating lesion on the CT scan. Findings are highly suspicious for small hepatic metastatic lesions. Segment 4A lesion has an SUV max of 5.49. Segment 8 lesion has an SUV max of 5.11. Segment 4B lesion has an SUV max of 5.76. No definite adrenal gland metastasis or abdominal/pelvic lymphadenopathy. Incidental  CT findings: Stable vascular disease. Stable colonic diverticulosis. SKELETON: Numerous hypermetabolic osseous lesions consistent with osseous metastatic disease. Large right ischial lesion has an SUV max of 10.10. Smaller left ischial lesion has an SUV max of 5.79. There are also spine and rib lesions but no spinal canal compromise or pathologic fracture. Incidental CT findings: None. IMPRESSION: 1. Hypermetabolic pleural and parenchymal lesions involving the left hemithorax consistent with recurrent neoplasm. No mediastinal or hilar disease. 2. Findings highly suspicious for new hepatic metastatic disease. 3. Widespread osseous metastatic disease. Electronically Signed   By: Marijo Sanes M.D.   On: 01/13/2022 11:25   MR BRAIN W WO CONTRAST  Result Date: 01/08/2022 CLINICAL DATA:  Small cell lung cancer staging. EXAM: MRI HEAD WITHOUT AND WITH CONTRAST TECHNIQUE: Multiplanar, multiecho pulse sequences of the brain and surrounding structures were obtained without and with intravenous contrast. CONTRAST:  53mL GADAVIST GADOBUTROL 1 MMOL/ML IV SOLN COMPARISON:  08/18/2021 FINDINGS: Brain: New 4  mm nodule of enhancement in the inferior left cerebellum. No infarction, hemorrhage, hydrocephalus, extra-axial collection or mass effect. Normal brain volume. Mild white matter disease without progression. Vascular: Normal flow voids and vascular enhancements. Skull and upper cervical spine: Normal marrow signal Sinuses/Orbits: Bilateral mastoid opacification including at the petrous apices, similar to prior. Negative nasopharynx. IMPRESSION: New 4 mm lesion in the inferior left cerebellum, likely solitary metastatic disease. Electronically Signed   By: Jorje Guild M.D.   On: 01/08/2022 12:28    ASSESSMENT AND PLAN: This is a very pleasant 71 years old white male diagnosed with unresectable limited stage (T3, N0, M0 small cell lung cancer presented with 2 nodules in the left lower lobe with no evidence of metastatic disease to the thoracic lymph nodes or distant metastases diagnosed in March 2023. The patient underwent a course of systemic chemotherapy with carboplatin for AUC of 5 on day 1 and etoposide 100 Mg/M2 on days 1, 2 and 3 status post 4 cycles.  This is concurrent with radiotherapy.   This was followed by prophylactic cranial irradiation under the care of Dr. Tammi Klippel. The patient has been on observation and feeling fine except for recent weight loss and hyponatremia. He had repeat staging workup including PET scan as well as MRI of the brain performed recently.  I personally and independently reviewed the imaging studies and discussed it and showed the images to the patient and his wife. Unfortunately his PET scan showed evidence for metastatic disease to the pleural-based nodules in the left lung as well as bone metastasis including the right pelvic area. He also had solitary 4 mm brain metastasis. I had a lengthy discussion with the patient today about his condition.  Unfortunately his treatment will be of palliative nature at this point. I discussed with the patient the option of  palliative care versus palliative systemic chemotherapy with carboplatin for AUC of 5 on day 1, etoposide 100 Mg/M2 on days 1, 2 and 3 with Cosela 240 Mg/M2 on the days of the chemotherapy and Imfinzi 1500 Mg IV on day 1 every 3 weeks during the chemotherapy followed by maintenance every 4 weeks after cycle #4 if he has no evidence for disease progression. I discussed with the patient the adverse effect of this treatment including but not limited to alopecia, myelosuppression, nausea and vomiting, peripheral neuropathy, liver or renal dysfunction as well as immunotherapy adverse effects. For the bone and brain metastasis, I will refer him to Dr. Tammi Klippel for discussion of the palliative radiotherapy to these areas.  The patient is interested in proceeding with the chemotherapy and he is expected to start the first cycle of this treatment next week. For the hypertension he was very anxious about his results today but his blood pressure is usually better at home. For the hyponatremia this is completely resolved at this point. He will come back for follow-up visit 1 week after the treatment. The patient was advised to call immediately if he has any other concerning symptoms in the interval. The patient voices understanding of current disease status and treatment options and is in agreement with the current care plan.  All questions were answered. The patient knows to call the clinic with any problems, questions or concerns. We can certainly see the patient much sooner if necessary.  The total time spent in the appointment was 35 minutes.  Disclaimer: This note was dictated with voice recognition software. Similar sounding words can inadvertently be transcribed and may not be corrected upon review.

## 2022-01-16 NOTE — Progress Notes (Signed)
DISCONTINUE ON PATHWAY REGIMEN - Small Cell Lung     A cycle is every 21 days:     Carboplatin      Etoposide   **Always confirm dose/schedule in your pharmacy ordering system**  REASON: Disease Progression PRIOR TREATMENT: [id number]: Carboplatin AUC=5 D1 + Etoposide 100 mg/m2 IV D1-3 q21 Days x 4 Cycles with Concurrent Radiation TREATMENT RESPONSE: Complete Response (CR)  START ON PATHWAY REGIMEN - Small Cell Lung     Cycles 1 through 4: A cycle is every 21 days:     Durvalumab      Carboplatin      Etoposide    Cycles 5 and beyond: A cycle is every 28 days:     Durvalumab   **Always confirm dose/schedule in your pharmacy ordering system**  Patient Characteristics: Relapsed or Progressive Disease, Second Line, Relapse > 6 Months Therapeutic Status: Relapsed or Progressive Disease Line of Therapy: Second Line Time to Relapse: Relapse > 6 Months Intent of Therapy: Non-Curative / Palliative Intent, Discussed with Patient

## 2022-01-17 ENCOUNTER — Other Ambulatory Visit: Payer: Self-pay

## 2022-01-17 ENCOUNTER — Encounter: Payer: Self-pay | Admitting: Urology

## 2022-01-17 ENCOUNTER — Ambulatory Visit
Admission: RE | Admit: 2022-01-17 | Discharge: 2022-01-17 | Disposition: A | Discharge status: Home / Self Care | Payer: Medicare Other | Source: Ambulatory Visit | Attending: Urology | Admitting: Urology

## 2022-01-17 ENCOUNTER — Ambulatory Visit: Payer: Medicare Other

## 2022-01-17 DIAGNOSIS — Z87891 Personal history of nicotine dependence: Secondary | ICD-10-CM | POA: Diagnosis not present

## 2022-01-17 DIAGNOSIS — C7951 Secondary malignant neoplasm of bone: Secondary | ICD-10-CM | POA: Insufficient documentation

## 2022-01-17 DIAGNOSIS — C3490 Malignant neoplasm of unspecified part of unspecified bronchus or lung: Secondary | ICD-10-CM | POA: Insufficient documentation

## 2022-01-17 DIAGNOSIS — C3432 Malignant neoplasm of lower lobe, left bronchus or lung: Secondary | ICD-10-CM | POA: Diagnosis not present

## 2022-01-17 NOTE — Progress Notes (Signed)
Telephone nursing Reconsult interview for a 71 yo man with limited stage (T3N0M0) small cell carcinoma in the LLL lung. I verified patient's identity and began nursing interview.  Patient reports occasional SOB that's managed well and is improving. No other issues reported at this time.  Meaningful use complete.  There were no vitals taken for this visit.  This concludes the interview.   Leandra Kern, LPN

## 2022-01-17 NOTE — Progress Notes (Signed)
Radiation Oncology         [phone] ________________________________  Outpatient Re-Consultation - Conducted via telephone due to current COVID-19 concerns for limiting patient exposure. Patient was notified in advance and has given verbal consent for this type of encounter.   Name: Kelly Olson [facility]. 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    ICD-10-CM   1. 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 is a 71 y.o. male seen at the request of Dr. Julien Nordmann.  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 He was treated with a RLL wedge resection on 07/10/2016, under the care and direction of Dr. Cyndia Bent. Margins were negative but there was evidence of visceral pleura involvement. He was followed in observation and developed another RLL nodule in 07/2018 so he proceeded with completion of RLL lobectomy on 07/29/18. Surgical pathology confirmed Stage 1b NSCLC, squamous cell carinoma with negative margins and no positive nodes but again, involvement of the visceral pleura. He continued in observation with serial CT scans showing numerous small bilateral pulmonary nodules without evidence of disease recurrence or progression until a follow up CT Chest from 03/23/21 showed new and significantly enlarged LLL nodules, highly suspicious for metastatic disease versus metachronous primaries. Multiple other bilateral pulmonary nodules were again noted but appeared stable. He had a PET scan on 04/06/21 for further evaluation and this  confirmed 2 persistent LLL pulmonary nodules with hypermetabolic activity, concerning for bronchogenic carcinoma. There was no evidence of malignant lymphadenopathy or distant metastatic disease. He underwent Nav-bronchoscopy under the care of Dr. Roxan Hockey on 04/13/21 with biopsy of the 2 LLL nodules. Final surgical pathology confirmed small cell carcinoma. His case and imaging studies were presented in a recent multidisciplinary thoracic cancer conference on 04/21/21 and consensus recommendations were to consider chemoradiation. He completed 4 cycles of carboplatin/etoposide under the care of Dr. Julien Nordmann, concurrent with radiation which was completed 06/20/21. He had a post-treatment CT Chest on 08/03/21 which showed an excellent response to treatment with significantly decreased size of the recently treated LLL lung nodule, no thoracic adenopathy and no new or progressive disease and MRI brain on 08/18/21 did not show any evidence of metastatic disease.  Therefore, he proceeded to prophylactic cranial irradiation (PCI) which was completed 09/09/21 and tolerated well.  He continued in observation under the care of Dr. Julien Nordmann and disease restaging CT Chest from 11/04/21 was without any clear evidence of disease progression or recurrence.  He was hospitalized in 11/2021 for hyponatremia and rib radiographs obtained during that stay, for evaluation of left-sided rib pain, showed increasing ill-defined and patchy opacity in the left lower lobe which was concerning for possible disease recurrence versus evolving post-radiation changes. A PET scan and MRI brain were ordered for further evaluation. The MRI brain was performed on 01/08/22 and showed a new, solitary, 4 mm lesion in the inferior left cerebellum. PET scan from 01/12/22 also unfortunately showed evidence of disease progression with hypermetabolic pleural and parenchymal lesions involving the left hemithorax, small hypermetabolic  foci in the liver, highly  suspicious for new hepatic metastases and widespread osseous metastatic disease. There was no mediastinal or hilar disease.     The patient was kindly referred to [place] today to discuss the potential role for palliative radiotherapy in the management of his painful bony disease and solitary brain metastasis.  He is planning to start new systemic therapy with carboplatin/etoposide/Imfinzi next week.   PREVIOUS RADIATION THERAPY: Yes: 08/29/21 - 09/09/21: The whole brain was treated to 25 Gy in 10 fractions to prevent brain metastases.    05/05/21 - 06/20/21:  The primary tumor and involved mediastinal adenopathy were treated to 66 Gy in 33 fractions of 2 Gy; concurrent with chemotherapy.  PAST MEDICAL HISTORY:  Past Medical History:  Diagnosis Date   Coronary artery disease, occlusive 06/02/2014   Multivessel CAD.mLAD-100%, mRCA 99%, dRCA 100%, mLCX 90%  And EF 35-45%. -->  Referred for CABG; nonischemic Myoview March 2020   Former heavy tobacco smoker    Quit in April 2016    GERD (gastroesophageal reflux disease)    Hyperlipidemia with target LDL less than 70    Hypertension    Ischemic cardiomyopathy - resolved 05/2014   Myoview: EF ~33% with "infarction vs. severe resting ischemia in LAD & RCA territory; b) EF by Cath: 35-45%. 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   Stage T2 a, N0      PAST SURGICAL HISTORY: Past Surgical History:  Procedure Laterality Date   CARDIAC CATHETERIZATION N/A 06/02/2014   Procedure: Left Heart Cath and Coronary Angiography;  Surgeon: Leonie Man, MD;  Location: [facility] INVASIVE CV LAB CUPID;  Service: Cardiovascular;  mLAD-100%, mRCA 99%, dRCA 100%, mLCX 90%  And EF 35-45%.   CORONARY ARTERY  BYPASS GRAFT N/A 06/04/2014   Procedure: CORONARY ARTERY BYPASS GRAFTING (CABG) times four using bilateral internal mammary arteries and EVH for left  leg saphenous vein;  Surgeon: Gaye Pollack, MD;  Location: [facility] OR;  Service: Open Heart Surgery;  LIMA-OM, RIMA-LAD, SVG-Diag, SVG-rPDA   IR IMAGING GUIDED PORT INSERTION  05/16/2021   NM MYOVIEW LTD  05/28/2014   Pre-CABG:  High Risk Nuclear Stress Test with multivessel distribution ischemia.  -- Severely ischemic Cardiomyoapthy with evidence of at least 2 vessel disease & EF of ~33%.  Images are consistent with either infarction or severe resting ischemia in the LAD & RCA territory.   NM MYOVIEW LTD  03/2018   EF 50-55%.  Small basal inferior fixed defect consistent with diaphragmatic attenuation.  No ischemia or infarction.  Septal dyskinesis due to post CABG state   TEE WITHOUT CARDIOVERSION N/A 06/04/2014   Procedure: TRANSESOPHAGEAL ECHOCARDIOGRAM (TEE);  Surgeon: Gaye Pollack, MD;  Location: [facility];  Service: Open Heart Surgery;  Laterality: N/A;   THORACOTOMY Right 07/10/2016   Procedure: THORACOTOMY;  Surgeon: Gaye Pollack, MD;  Location: [facility] OR;  Service: Thoracic;  Laterality: Right;   THORACOTOMY/LOBECTOMY Right 07/29/2018   Procedure: THORACOTOMY/RIGHT LOWER LOBECTOMY;  Surgeon: Gaye Pollack, MD;  Location: [facility] OR;  Service: Thoracic;  Laterality: Right;   [place] ECHOCARDIOGRAM  10/2014   Mild concentric LVH. EF 50-55% with mild  HK of basal anteroseptal myocardium. GR 1 DD.   VIDEO ASSISTED THORACOSCOPY (VATS)/WEDGE RESECTION Right 07/10/2016   Procedure: VIDEO ASSISTED THORACOSCOPY (VATS)/WEDGE RESECTION;  Surgeon: Gaye Pollack, MD;  Location: [facility] OR;  Service: Thoracic;  Laterality: Right;   VIDEO BRONCHOSCOPY WITH ENDOBRONCHIAL NAVIGATION N/A 04/13/2021   Procedure: VIDEO BRONCHOSCOPY WITH ENDOBRONCHIAL NAVIGATION;  Surgeon: Melrose Nakayama, MD;  Location: [facility] OR;  Service: Thoracic;  Laterality: N/A;     FAMILY HISTORY:  Family History  Problem Relation Age of Onset   Emphysema Mother    Cardiomyopathy Father    Healthy Sister    Heart attack Maternal Grandfather    Healthy Sister    Heart disease Paternal Uncle     SOCIAL HISTORY:  Social History   Socioeconomic History   Marital status: Married    Spouse name: Not on file   Number of children: Not on file   Years of education: Not on file   Highest education level: Not on file  Occupational History   Not on file  Tobacco Use   Smoking status: Former    Packs/day: 0.80    Years: 46.00    Total pack years: 36.80    Types: Cigarettes    Quit date: 05/29/2014    Years since quitting: 7.6   Smokeless tobacco: Never  Vaping Use   Vaping Use: Never used  Substance and Sexual Activity   Alcohol use: Yes    Alcohol/week: 7.0 standard drinks of alcohol    Types: 7 Glasses of wine per week   Drug use: No   Sexual activity: Yes  Other Topics Concern   Not on file  Social History Narrative   Wife recently diagnosed with early stage breast cancer   Social Determinants of Health   Financial Resource Strain: Not on file  Food Insecurity: No Food Insecurity (12/17/2021)   Hunger Vital Sign    Worried About Running Out of Food in the Last Year: Never true    Ran Out of Food in the Last Year: Never true  Transportation Needs: No Transportation Needs (12/17/2021)   PRAPARE - Hydrologist (Medical): No    Lack of Transportation (Non-Medical): No  Physical Activity: Not on file  Stress: Not on file  Social Connections: Not on file  Intimate Partner Violence: Not At Risk (12/17/2021)   Humiliation, Afraid, Rape, and Kick questionnaire    Fear of Current or Ex-Partner: No    Emotionally Abused: No    Physically Abused: No    Sexually Abused: No    ALLERGIES: Patient has no known allergies.  MEDICATIONS:  Current Outpatient Medications  Medication Sig Dispense Refill   acetaminophen  (TYLENOL) 325 MG tablet Take 650 mg by mouth every 6 (six) hours as needed for moderate pain.     albuterol (VENTOLIN HFA) 108 (90 Base) MCG/ACT inhaler Inhale 2 puffs into the lungs every 6 (six) hours as needed.     aspirin 81 MG tablet Take 81 mg by mouth daily.     atorvastatin (LIPITOR) 40 MG tablet TAKE 1 TABLET AT BEDTIME (Patient taking differently: Take 40 mg by mouth daily.) 90 tablet 3   carvedilol (COREG) 3.125 MG tablet TAKE 2 TABLETS TWICE A DAY WITH     MEALS (Patient taking differently: Take 6.25 mg by mouth 2 (two) times daily with a meal.) 360 tablet 2   losartan (COZAAR) 25 MG tablet Take 1 tablet (25 mg total) by mouth daily.  90 tablet 3   metFORMIN (GLUCOPHAGE-XR) 500 MG 24 hr tablet Take 500 mg by mouth 2 (two) times daily.     Pseudoephedrine-APAP 30-325 MG TABS Take 1 tablet by mouth daily as needed (sinus congestion).     sildenafil (VIAGRA) 50 MG tablet Take 1 tablet (50 mg total) by mouth daily as needed for erectile dysfunction. 10 tablet 5   sucralfate (CARAFATE) 1 g tablet Take 1 tablet (1 g total) by mouth 4 (four) times daily -  with meals and at bedtime. 5 min before meals for radiation induced esophagitis 120 tablet 2   urea (URE-NA) 15 g PACK oral packet Take 15 g by mouth 2 (two) times daily. (Patient not taking: Reported on 01/17/2022) 60 packet 1   No current facility-administered medications for this encounter.    REVIEW OF SYSTEMS:  On review of systems, the patient reports that he is doing well overall. He denies any chest pain, increased shortness of breath, cough, fevers, chills, night sweats, or unintended weight changes. He denies any bowel or bladder disturbances, and denies abdominal pain, nausea or vomiting. He does report some left posterolateral rib/chest wall discomfort and right groin pain ongoing for the past month but not progressively worsening. He is taking Tylenol as needed with mild relief but has been significantly limiting his activities  since any weightbearing activity exacerbates the hip and rib pain. He denies any recent headaches, visual changes or imbalance. A complete review of systems is obtained and is otherwise negative.    PHYSICAL EXAM:  Wt Readings from Last 3 Encounters:  01/16/22 147 lb (66.7 kg)  01/09/22 148 lb 3.2 oz (67.2 kg)  01/02/22 148 lb 8 oz (67.4 kg)   Temp Readings from Last 3 Encounters:  01/16/22 97.6 F (36.4 C) (Oral)  01/02/22 98 F (36.7 C) (Oral)  12/20/21 97.8 F (36.6 C) (Oral)   BP Readings from Last 3 Encounters:  01/16/22 (!) 173/95  01/09/22 122/72  01/02/22 138/79   Pulse Readings from Last 3 Encounters:  01/16/22 61  01/09/22 (!) 52  01/02/22 84   Pain Assessment Pain Score: 0-No pain/10  Unable to assess due to telephone visit format.   KPS = 100  100 - Normal; no complaints; no evidence of disease. 90   - Able to carry on normal activity; minor signs or symptoms of disease. 80   - Normal activity with effort; some signs or symptoms of disease. 39   - Cares for self; unable to carry on normal activity or to do active work. 60   - Requires occasional assistance, but is able to care for most of his personal needs. 50   - Requires considerable assistance and frequent medical care. 53   - Disabled; requires special care and assistance. 67   - Severely disabled; hospital admission is indicated although death not imminent. 76   - Very sick; hospital admission necessary; active supportive treatment necessary. 10   - Moribund; fatal processes progressing rapidly. 0     - Dead  Karnofsky DA, Abelmann Selden, Craver LS and Burchenal [facility] [phone]) The use of the nitrogen mustards in the palliative treatment of carcinoma: with particular reference to bronchogenic carcinoma Cancer 1 [id number]  LABORATORY DATA:  Lab Results  Component Value Date   WBC 9.5 01/02/2022   HGB 14.8 01/02/2022   HCT 45.0 01/02/2022   MCV 98.5 01/02/2022   PLT 211 01/02/2022   Lab Results  Component  Value Date   NA 139  01/02/2022   K 4.5 01/02/2022   CL 103 01/02/2022   CO2 28 01/02/2022   Lab Results  Component Value Date   ALT 25 01/02/2022   AST 25 01/02/2022   ALKPHOS 80 01/02/2022   BILITOT 0.7 01/02/2022     RADIOGRAPHY: NM PET Image Restage (PS) Skull Base to Thigh (F-18 FDG)  Result Date: 01/13/2022 CLINICAL DATA:  Subsequent treatment strategy for small cell lung cancer. Therapy completed 07/09/2021. EXAM: NUCLEAR MEDICINE PET SKULL BASE TO THIGH TECHNIQUE: 7.37 mCi F-18 FDG was injected intravenously. Full-ring PET imaging was performed from the skull base to thigh after the radiotracer. CT data was obtained and used for attenuation correction and anatomic localization. Fasting blood glucose: 104 mg/dl COMPARISON:  Prior PET-CT 04/06/2021 and recent chest CT 11/04/2021 FINDINGS: Mediastinal blood pool activity: SUV max 1.91 Liver activity: SUV max NA NECK: No hypermetabolic lymph nodes in the neck. Incidental CT findings: Bilateral carotid artery calcifications. CHEST: New and enlarging parenchymal and pleural lesions involving the left hemithorax. 2 cm pleural lesion at the left lung base medially has an SUV max of 9.54. 15 mm pulmonary nodule at the left lung base has an SUV max of 8.81. Small left-sided pleural nodule measuring 8 mm has an SUV max of 5.26. Other smaller hypermetabolic pleural and parenchymal nodules at the left lung base consistent with recurrent disease. No right-sided pleural or parenchymal hypermetabolic disease. No chest wall lesions, supraclavicular or axillary adenopathy. Incidental CT findings: Stable severe underlying emphysematous changes and pulmonary scarring. Stable advanced vascular disease. ABDOMEN/PELVIS: Small hypermetabolic foci are noted in the liver without definite correlating lesion on the CT scan. Findings are highly suspicious for small hepatic metastatic lesions. Segment 4A lesion has an SUV max of 5.49. Segment 8 lesion has an SUV max of  5.11. Segment 4B lesion has an SUV max of 5.76. No definite adrenal gland metastasis or abdominal/pelvic lymphadenopathy. Incidental CT findings: Stable vascular disease. Stable colonic diverticulosis. SKELETON: Numerous hypermetabolic osseous lesions consistent with osseous metastatic disease. Large right ischial lesion has an SUV max of 10.10. Smaller left ischial lesion has an SUV max of 5.79. There are also spine and rib lesions but no spinal canal compromise or pathologic fracture. Incidental CT findings: None. IMPRESSION: 1. Hypermetabolic pleural and parenchymal lesions involving the left hemithorax consistent with recurrent neoplasm. No mediastinal or hilar disease. 2. Findings highly suspicious for new hepatic metastatic disease. 3. Widespread osseous metastatic disease. Electronically Signed   By: Marijo Sanes M.D.   On: 01/13/2022 11:25   MR BRAIN W WO CONTRAST  Result Date: 01/08/2022 CLINICAL DATA:  Small cell lung cancer staging. EXAM: MRI HEAD WITHOUT AND WITH CONTRAST TECHNIQUE: Multiplanar, multiecho pulse sequences of the brain and surrounding structures were obtained without and with intravenous contrast. CONTRAST:  21mL GADAVIST GADOBUTROL 1 MMOL/ML IV SOLN COMPARISON:  08/18/2021 FINDINGS: Brain: New 4 mm nodule of enhancement in the inferior left cerebellum. No infarction, hemorrhage, hydrocephalus, extra-axial collection or mass effect. Normal brain volume. Mild white matter disease without progression. Vascular: Normal flow voids and vascular enhancements. Skull and upper cervical spine: Normal marrow signal Sinuses/Orbits: Bilateral mastoid opacification including at the petrous apices, similar to prior. Negative nasopharynx. IMPRESSION: New 4 mm lesion in the inferior left cerebellum, likely solitary metastatic disease. Electronically Signed   By: Jorje Guild M.D.   On: 01/08/2022 12:28      IMPRESSION/PLAN:   This visit was conducted via telephone to spare the patient  unnecessary potential exposure in  the healthcare setting during the current COVID-19 pandemic  1. 71 y.o.  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 After reviewing imaging and findings with Dr. Tammi Klippel, I talked to the patient about the findings and workup thus far. We discussed the natural history of extensive stage small cell lung cancer and general treatment, highlighting the role of radiotherapy in the management. At this point, the patient would potentially benefit from palliative radiotherapy. Regarding the solitary brain lesion, the options include whole brain irradiation versus stereotactic radiosurgery. He has previously completed a course of prophylactic cranial irradiation and he has a solitary lesion which could be treated with stereotactic radiosurgery [facility]). Stereotactic radiosurgery carries a higher likelihood for local tumor control at the targeted sites with lower associated risk for neurocognitive changes such as memory loss. However, the use of stereotactic radiosurgery in this setting may leave the patient at increased risk for new brain metastases elsewhere in the brain as high as 50-60% pending his overall disease response with systemic therapy. Accordingly, patients who receive stereotactic radiosurgery in this setting should undergo ongoing surveillance imaging with brain MRI more frequently in order to identify and treat new small brain metastases before they become symptomatic. Stereotactic radiosurgery does carry some different risks, including but not limited to a risk of radionecrosis. We discussed this today. Regarding the painful bony metastases in the right pubic bone and left ribs, we recommend a 2 week course of daily, palliative radiation. We reviewed the anticipated acute and late sequelae  associated with radiation in this setting. The patient was encouraged to ask questions that were answered to his stated satisfaction.  At the conclusion of our conversation, the patient is in agreement to proceed with the recommended single fraction SRS to the solitary 4 mm left cerebellar lesion and a 2 week course of daily palliative radiation to the right pubic bone and left rib(s). We will coordinate our treatments as to not interfere with his planned systemic therapy. Hopefully, if he starts systemic therapy next week, we could shoot for starting the radiation the following week with the intention of completing treatments prior to his next scheduled cycle of systemic treatment. We will share our discussion with Dr. Julien Nordmann and proceed with treatment planning accordingly, ideally getting him in for [facility] next week. He knows that he is welcome to call with any questions or concerns in the interim.   I personally spent 30 minutes in this encounter including chart review, reviewing radiological studies, telephone conversation with the patient, entering orders and completing documentation.    Nicholos Johns, PA-C    Tyler Pita, MD  [facility] Direct Dial: [phone]  Fax: [phone] [email or web address]  Skype  LinkedIn

## 2022-01-17 NOTE — Addendum Note (Signed)
Encounter addended by: Freeman Caldron, PA-C on: 01/17/2022 3:48 PM  Actions taken: Visit diagnoses modified, Problem List modified

## 2022-01-18 ENCOUNTER — Other Ambulatory Visit: Payer: Self-pay | Admitting: Radiation Therapy

## 2022-01-18 DIAGNOSIS — C7931 Secondary malignant neoplasm of brain: Secondary | ICD-10-CM

## 2022-01-19 ENCOUNTER — Ambulatory Visit (HOSPITAL_COMMUNITY)
Admission: RE | Admit: 2022-01-19 | Discharge: 2022-01-19 | Disposition: A | Discharge status: Home / Self Care | Payer: Medicare Other | Source: Ambulatory Visit | Attending: Radiation Oncology | Admitting: Radiation Oncology

## 2022-01-19 DIAGNOSIS — C7931 Secondary malignant neoplasm of brain: Secondary | ICD-10-CM | POA: Insufficient documentation

## 2022-01-19 DIAGNOSIS — G9389 Other specified disorders of brain: Secondary | ICD-10-CM | POA: Diagnosis not present

## 2022-01-19 MED ORDER — GADOBUTROL 1 MMOL/ML IV SOLN
8.0000 mL | Freq: Once | INTRAVENOUS | Status: AC | PRN
  Administered 2022-01-19: 8 mL via INTRAVENOUS

## 2022-01-19 MED ORDER — HEPARIN SOD (PORK) LOCK FLUSH 100 UNIT/ML IV SOLN
500.0000 [IU] | INTRAVENOUS | Status: AC | PRN
  Administered 2022-01-19: 500 [IU]
  Filled 2022-01-19: qty 5

## 2022-01-24 ENCOUNTER — Ambulatory Visit
Admission: RE | Admit: 2022-01-24 | Discharge: 2022-01-24 | Disposition: A | Discharge status: Home / Self Care | Payer: Medicare Other | Source: Ambulatory Visit | Attending: Radiation Oncology | Admitting: Radiation Oncology

## 2022-01-24 ENCOUNTER — Other Ambulatory Visit: Payer: Medicare Other

## 2022-01-24 ENCOUNTER — Other Ambulatory Visit: Payer: Self-pay

## 2022-01-24 VITALS — BP 173/92 | HR 68 | Temp 97.0°F | Resp 18 | Ht 71.0 in | Wt 146.2 lb

## 2022-01-24 DIAGNOSIS — J440 Chronic obstructive pulmonary disease with acute lower respiratory infection: Secondary | ICD-10-CM | POA: Diagnosis present

## 2022-01-24 DIAGNOSIS — K219 Gastro-esophageal reflux disease without esophagitis: Secondary | ICD-10-CM | POA: Diagnosis present

## 2022-01-24 DIAGNOSIS — C801 Malignant (primary) neoplasm, unspecified: Secondary | ICD-10-CM | POA: Diagnosis not present

## 2022-01-24 DIAGNOSIS — Z1152 Encounter for screening for COVID-19: Secondary | ICD-10-CM | POA: Diagnosis not present

## 2022-01-24 DIAGNOSIS — I5032 Chronic diastolic (congestive) heart failure: Secondary | ICD-10-CM | POA: Diagnosis present

## 2022-01-24 DIAGNOSIS — C7951 Secondary malignant neoplasm of bone: Secondary | ICD-10-CM

## 2022-01-24 DIAGNOSIS — C3490 Malignant neoplasm of unspecified part of unspecified bronchus or lung: Secondary | ICD-10-CM | POA: Diagnosis not present

## 2022-01-24 DIAGNOSIS — D6959 Other secondary thrombocytopenia: Secondary | ICD-10-CM | POA: Diagnosis present

## 2022-01-24 DIAGNOSIS — Z87891 Personal history of nicotine dependence: Secondary | ICD-10-CM | POA: Diagnosis not present

## 2022-01-24 DIAGNOSIS — I255 Ischemic cardiomyopathy: Secondary | ICD-10-CM | POA: Diagnosis present

## 2022-01-24 DIAGNOSIS — E876 Hypokalemia: Secondary | ICD-10-CM | POA: Diagnosis not present

## 2022-01-24 DIAGNOSIS — I110 Hypertensive heart disease with heart failure: Secondary | ICD-10-CM | POA: Diagnosis present

## 2022-01-24 DIAGNOSIS — J168 Pneumonia due to other specified infectious organisms: Secondary | ICD-10-CM | POA: Diagnosis not present

## 2022-01-24 DIAGNOSIS — C7931 Secondary malignant neoplasm of brain: Secondary | ICD-10-CM | POA: Diagnosis not present

## 2022-01-24 DIAGNOSIS — Z7902 Long term (current) use of antithrombotics/antiplatelets: Secondary | ICD-10-CM | POA: Diagnosis not present

## 2022-01-24 DIAGNOSIS — C3432 Malignant neoplasm of lower lobe, left bronchus or lung: Secondary | ICD-10-CM | POA: Insufficient documentation

## 2022-01-24 DIAGNOSIS — Z7982 Long term (current) use of aspirin: Secondary | ICD-10-CM | POA: Diagnosis not present

## 2022-01-24 DIAGNOSIS — J441 Chronic obstructive pulmonary disease with (acute) exacerbation: Secondary | ICD-10-CM | POA: Diagnosis not present

## 2022-01-24 DIAGNOSIS — J189 Pneumonia, unspecified organism: Secondary | ICD-10-CM | POA: Diagnosis not present

## 2022-01-24 DIAGNOSIS — E785 Hyperlipidemia, unspecified: Secondary | ICD-10-CM | POA: Diagnosis present

## 2022-01-24 DIAGNOSIS — Z951 Presence of aortocoronary bypass graft: Secondary | ICD-10-CM | POA: Diagnosis not present

## 2022-01-24 DIAGNOSIS — I2510 Atherosclerotic heart disease of native coronary artery without angina pectoris: Secondary | ICD-10-CM | POA: Diagnosis not present

## 2022-01-24 DIAGNOSIS — Z902 Acquired absence of lung [part of]: Secondary | ICD-10-CM | POA: Diagnosis not present

## 2022-01-24 DIAGNOSIS — Z85118 Personal history of other malignant neoplasm of bronchus and lung: Secondary | ICD-10-CM | POA: Diagnosis not present

## 2022-01-24 DIAGNOSIS — J9601 Acute respiratory failure with hypoxia: Secondary | ICD-10-CM | POA: Diagnosis not present

## 2022-01-24 DIAGNOSIS — J439 Emphysema, unspecified: Secondary | ICD-10-CM | POA: Diagnosis present

## 2022-01-24 DIAGNOSIS — J90 Pleural effusion, not elsewhere classified: Secondary | ICD-10-CM | POA: Diagnosis not present

## 2022-01-24 DIAGNOSIS — C787 Secondary malignant neoplasm of liver and intrahepatic bile duct: Secondary | ICD-10-CM | POA: Diagnosis present

## 2022-01-24 DIAGNOSIS — R7401 Elevation of levels of liver transaminase levels: Secondary | ICD-10-CM | POA: Diagnosis not present

## 2022-01-24 DIAGNOSIS — R7989 Other specified abnormal findings of blood chemistry: Secondary | ICD-10-CM | POA: Diagnosis not present

## 2022-01-24 DIAGNOSIS — Z825 Family history of asthma and other chronic lower respiratory diseases: Secondary | ICD-10-CM | POA: Diagnosis not present

## 2022-01-24 DIAGNOSIS — R0902 Hypoxemia: Secondary | ICD-10-CM | POA: Diagnosis not present

## 2022-01-24 DIAGNOSIS — Z8249 Family history of ischemic heart disease and other diseases of the circulatory system: Secondary | ICD-10-CM | POA: Diagnosis not present

## 2022-01-24 DIAGNOSIS — Y92239 Unspecified place in hospital as the place of occurrence of the external cause: Secondary | ICD-10-CM | POA: Diagnosis present

## 2022-01-24 MED ORDER — HEPARIN SOD (PORK) LOCK FLUSH 100 UNIT/ML IV SOLN
500.0000 [IU] | Freq: Once | INTRAVENOUS | Status: AC
  Administered 2022-01-24: 500 [IU] via INTRAVENOUS

## 2022-01-24 MED ORDER — SODIUM CHLORIDE 0.9% FLUSH
10.0000 mL | INTRAVENOUS | Status: AC
  Administered 2022-01-24: 10 mL via INTRAVENOUS

## 2022-01-24 MED FILL — Dexamethasone Sodium Phosphate Inj 100 MG/10ML: INTRAMUSCULAR | Qty: 1 | Status: AC

## 2022-01-24 MED FILL — Fosaprepitant Dimeglumine For IV Infusion 150 MG (Base Eq): INTRAVENOUS | Qty: 5 | Status: AC

## 2022-01-24 NOTE — Progress Notes (Signed)
  Radiation Oncology         [phone]) [phone] ________________________________  Name: Kelly Olson. MRN: [id number]  Date: 01/24/2022  DOB: November 12, 1950  SIMULATION AND TREATMENT PLANNING NOTE    ICD-10-CM   1. Primary malignant neoplasm of lung with metastasis to brain (HCC)  C34.90    C79.31     2. Metastasis to bone [facility])  C79.51       DIAGNOSIS:  71 y/o male with recurrent, now metastatic small cell lung cancer involving a solitary brain lesion and painful bony metastases, initially diagnosed as unresectable limited stage IIb (T3, N0, M0) small cell lung cancer in the LLL lung  NARRATIVE:  The patient was brought to the [place].  Identity was confirmed.  All relevant records and images related to the planned course of therapy were reviewed.  The patient freely provided informed written consent to proceed with treatment after reviewing the details related to the planned course of therapy. The consent form was witnessed and verified by the simulation staff. Intravenous access was established for contrast administration. Then, the patient was set-up in a stable reproducible supine position for radiation therapy.  A relocatable thermoplastic stereotactic head frame was fabricated for precise immobilization.  CT images were obtained.  Surface markings were placed.  The CT images were loaded into the planning software and fused with the patient's targeting MRI scan.  Then the target and avoidance structures were contoured.  Treatment planning then occurred.  The radiation prescription was entered and confirmed.  I have requested 3D planning  I have requested a DVH of the following structures: Brain stem, brain, left eye, right eye, lenses, optic chiasm, target volumes, uninvolved brain, and normal tissue.    SPECIAL TREATMENT PROCEDURE:  The planned course of therapy using radiation constitutes a special treatment procedure. Special care is required in the management of  this patient for the following reasons. This treatment constitutes a Special Treatment Procedure for the following reason: High dose per fraction requiring special monitoring for increased toxicities of treatment including daily imaging.  The special nature of the planned course of radiotherapy will require increased physician supervision and oversight to ensure patient's safety with optimal treatment outcomes.  This requires extended time and effort.  PLAN:  The patient will receive 20 Gy in 1 fraction to the brain met and 30 Gy in 10 fractions to the left ribs and right hip.  ________________________________  Sheral Apley. Tammi Klippel, M.D.

## 2022-01-25 ENCOUNTER — Other Ambulatory Visit: Payer: Medicare Other

## 2022-01-25 ENCOUNTER — Ambulatory Visit: Payer: Medicare Other | Admitting: Physician Assistant

## 2022-01-25 ENCOUNTER — Inpatient Hospital Stay: Payer: Medicare Other

## 2022-01-25 ENCOUNTER — Other Ambulatory Visit: Payer: Self-pay | Admitting: Physician Assistant

## 2022-01-25 ENCOUNTER — Telehealth: Payer: Self-pay

## 2022-01-25 VITALS — BP 156/82 | HR 61 | Temp 97.8°F | Resp 16 | Wt 146.2 lb

## 2022-01-25 DIAGNOSIS — E876 Hypokalemia: Secondary | ICD-10-CM

## 2022-01-25 DIAGNOSIS — C3492 Malignant neoplasm of unspecified part of left bronchus or lung: Secondary | ICD-10-CM

## 2022-01-25 DIAGNOSIS — J189 Pneumonia, unspecified organism: Secondary | ICD-10-CM | POA: Diagnosis not present

## 2022-01-25 DIAGNOSIS — Z95828 Presence of other vascular implants and grafts: Secondary | ICD-10-CM

## 2022-01-25 LAB — CBC WITH DIFFERENTIAL (CANCER CENTER ONLY)
Abs Immature Granulocytes: 0.32 10*3/uL — ABNORMAL HIGH (ref 0.00–0.07)
Basophils Absolute: 0.1 10*3/uL (ref 0.0–0.1)
Basophils Relative: 0 %
Eosinophils Absolute: 0.1 10*3/uL (ref 0.0–0.5)
Eosinophils Relative: 1 %
HCT: 41 % (ref 39.0–52.0)
Hemoglobin: 14.1 g/dL (ref 13.0–17.0)
Immature Granulocytes: 3 %
Lymphocytes Relative: 4 %
Lymphs Abs: 0.5 10*3/uL — ABNORMAL LOW (ref 0.7–4.0)
MCH: 33.1 pg (ref 26.0–34.0)
MCHC: 34.4 g/dL (ref 30.0–36.0)
MCV: 96.2 fL (ref 80.0–100.0)
Monocytes Absolute: 0.6 10*3/uL (ref 0.1–1.0)
Monocytes Relative: 5 %
Neutro Abs: 10.7 10*3/uL — ABNORMAL HIGH (ref 1.7–7.7)
Neutrophils Relative %: 87 %
Platelet Count: 103 10*3/uL — ABNORMAL LOW (ref 150–400)
RBC: 4.26 MIL/uL (ref 4.22–5.81)
RDW: 13.7 % (ref 11.5–15.5)
WBC Count: 12.2 10*3/uL — ABNORMAL HIGH (ref 4.0–10.5)
nRBC: 0 % (ref 0.0–0.2)

## 2022-01-25 LAB — CMP (CANCER CENTER ONLY)
ALT: 207 U/L — ABNORMAL HIGH (ref 0–44)
AST: 176 U/L (ref 15–41)
Albumin: 3.6 g/dL (ref 3.5–5.0)
Alkaline Phosphatase: 190 U/L — ABNORMAL HIGH (ref 38–126)
BUN: 37 mg/dL — ABNORMAL HIGH (ref 8–23)
CO2: >45 mmol/L — ABNORMAL HIGH (ref 22–32)
Calcium: 8.9 mg/dL (ref 8.9–10.3)
Chloride: 91 mmol/L — ABNORMAL LOW (ref 98–111)
Creatinine: 1.03 mg/dL (ref 0.61–1.24)
GFR, Estimated: >60 mL/min (ref >60)
Glucose, Bld: 121 mg/dL — ABNORMAL HIGH (ref 70–99)
Potassium: 2 mmol/L — CL (ref 3.5–5.1)
Sodium: 143 mmol/L (ref 135–145)
Total Bilirubin: 1.3 mg/dL — ABNORMAL HIGH (ref 0.3–1.2)
Total Protein: 6 g/dL — ABNORMAL LOW (ref 6.5–8.1)

## 2022-01-25 LAB — TSH: TSH: 0.548 u[IU]/mL (ref 0.350–4.500)

## 2022-01-25 MED ORDER — POTASSIUM CHLORIDE CRYS ER 20 MEQ PO TBCR: 20.0000 meq | EXTENDED_RELEASE_TABLET | Freq: Two times a day (BID) | ORAL | 0 refills | Status: DC

## 2022-01-25 MED ORDER — SODIUM CHLORIDE 0.9 % IV SOLN
10.0000 mg | Freq: Once | INTRAVENOUS | Status: AC
  Administered 2022-01-25: 10 mg via INTRAVENOUS
  Filled 2022-01-25: qty 10

## 2022-01-25 MED ORDER — SODIUM CHLORIDE 0.9% FLUSH
10.0000 mL | Freq: Once | INTRAVENOUS | Status: AC
  Administered 2022-01-25: 10 mL

## 2022-01-25 MED ORDER — POTASSIUM CHLORIDE IN NACL 40-0.9 MEQ/L-% IV SOLN
Freq: Once | INTRAVENOUS | Status: AC
  Filled 2022-01-25: qty 1000

## 2022-01-25 MED ORDER — SODIUM CHLORIDE 0.9 % IV SOLN
1500.0000 mg | Freq: Once | INTRAVENOUS | Status: AC
  Administered 2022-01-25: 1500 mg via INTRAVENOUS
  Filled 2022-01-25: qty 30

## 2022-01-25 MED ORDER — SODIUM CHLORIDE 0.9 % IV SOLN
150.0000 mg | Freq: Once | INTRAVENOUS | Status: AC
  Administered 2022-01-25: 150 mg via INTRAVENOUS
  Filled 2022-01-25: qty 150

## 2022-01-25 MED ORDER — PALONOSETRON HCL INJECTION 0.25 MG/5ML
0.2500 mg | Freq: Once | INTRAVENOUS | Status: AC
  Administered 2022-01-25: 0.25 mg via INTRAVENOUS
  Filled 2022-01-25: qty 5

## 2022-01-25 MED ORDER — SODIUM CHLORIDE 0.9% FLUSH
10.0000 mL | INTRAVENOUS | Status: DC | PRN
  Administered 2022-01-25: 10 mL

## 2022-01-25 MED ORDER — HEPARIN SOD (PORK) LOCK FLUSH 100 UNIT/ML IV SOLN
500.0000 [IU] | Freq: Once | INTRAVENOUS | Status: AC | PRN
  Administered 2022-01-25: 500 [IU]

## 2022-01-25 MED ORDER — SODIUM CHLORIDE 0.9 % IV SOLN: Freq: Once | INTRAVENOUS | Status: AC

## 2022-01-25 MED ORDER — SODIUM CHLORIDE 0.9 % IV SOLN
444.5000 mg | Freq: Once | INTRAVENOUS | Status: AC
  Administered 2022-01-25: 440 mg via INTRAVENOUS
  Filled 2022-01-25: qty 44

## 2022-01-25 MED ORDER — TRILACICLIB DIHYDROCHLORIDE INJECTION 300 MG
240.0000 mg/m2 | Freq: Once | INTRAVENOUS | Status: AC
  Administered 2022-01-25: 435 mg via INTRAVENOUS
  Filled 2022-01-25: qty 29

## 2022-01-25 MED ORDER — SODIUM CHLORIDE 0.9 % IV SOLN
100.0000 mg/m2 | Freq: Once | INTRAVENOUS | Status: AC
  Administered 2022-01-25: 180 mg via INTRAVENOUS
  Filled 2022-01-25: qty 9

## 2022-01-25 MED FILL — Dexamethasone Sodium Phosphate Inj 100 MG/10ML: INTRAMUSCULAR | Qty: 1 | Status: AC

## 2022-01-25 NOTE — Patient Instructions (Signed)
[facility]  Discharge Instructions: Thank you for choosing [facility] to provide your oncology and hematology care.   If you have a lab appointment with the [facility], please go directly to the [facility] and check in at the registration area.   Wear comfortable clothing and clothing appropriate for easy access to any Portacath or PICC line.   We strive to give you quality time with your provider. You may need to reschedule your appointment if you arrive late (15 or more minutes).  Arriving late affects you and other patients whose appointments are after yours.  Also, if you miss three or more appointments without notifying the office, you may be dismissed from the clinic at the provider's discretion.      For prescription refill requests, have your pharmacy contact our office and allow 72 hours for refills to be completed.    Today you received the following chemotherapy and/or immunotherapy agents Cosela, Imfinzi, Carboplatin, & Etoposide      To help prevent nausea and vomiting after your treatment, we encourage you to take your nausea medication as directed.  BELOW ARE SYMPTOMS THAT SHOULD BE REPORTED IMMEDIATELY: *FEVER GREATER THAN 100.4 F (38 C) OR HIGHER *CHILLS OR SWEATING *NAUSEA AND VOMITING THAT IS NOT CONTROLLED WITH YOUR NAUSEA MEDICATION *UNUSUAL SHORTNESS OF BREATH *UNUSUAL BRUISING OR BLEEDING *URINARY PROBLEMS (pain or burning when urinating, or frequent urination) *BOWEL PROBLEMS (unusual diarrhea, constipation, pain near the anus) TENDERNESS IN MOUTH AND THROAT WITH OR WITHOUT PRESENCE OF ULCERS (sore throat, sores in mouth, or a toothache) UNUSUAL RASH, SWELLING OR PAIN  UNUSUAL VAGINAL DISCHARGE OR ITCHING   Items with * indicate a potential emergency and should be followed up as soon as possible or go to the Emergency Department if any problems should occur.  Please show the CHEMOTHERAPY ALERT CARD or  IMMUNOTHERAPY ALERT CARD at check-in to the Emergency Department and triage nurse.  Should you have questions after your visit or need to cancel or reschedule your appointment, please contact [facility]  Dept: [phone]  and follow the prompts.  Office hours are 8:00 a.m. to 4:30 p.m. Monday - Friday. Please note that voicemails left after 4:00 p.m. may not be returned until the following business day.  We are closed weekends and major holidays. You have access to a nurse at all times for urgent questions. Please call the main number to the clinic Dept: [phone] and follow the prompts.   For any non-urgent questions, you may also contact your provider using MyChart. We now offer e-Visits for anyone 34 and older to request care online for non-urgent symptoms. For details visit [email or web address].   Also download the MyChart app! Go to the app store, search "MyChart", open the app, select [facility], and log in with your MyChart username and password.  Trilaciclib Injection What is this medication? TRILACICLIB (TRYE la SYE klib) prevents low levels of red blood cells, white blood cells, and platelets caused by chemotherapy. It works by protecting the cells in your bone marrow that make these blood cells. This lowers the risk of infection and bleeding. It also reduces the need for blood transfusions. This medicine may be used for other purposes; ask your health care provider or pharmacist if you have questions. COMMON BRAND NAME(S): COSELA What should I tell my care team before I take this medication? They need to know if you have any of these conditions: Liver disease An  unusual or allergic reaction to trilaciclib, other medications, foods, dyes, or preservatives Pregnant or trying to get pregnant Breastfeeding How should I use this medication? This medication is injected into a vein. It is given by your care team in a hospital or clinic setting. Talk  to your care team about the use of this medication in children. Special care may be needed. Overdosage: If you think you have taken too much of this medicine contact a poison control center or emergency room at once. NOTE: This medicine is only for you. Do not share this medicine with others. What if I miss a dose? Keep appointments for follow-up doses. It is important to not miss your dose. Call your care team if you are unable to keep an appointment. What may interact with this medication? Cisplatin Dalfampridine Dofetilide Metformin This list may not describe all possible interactions. Give your health care provider a list of all the medicines, herbs, non-prescription drugs, or dietary supplements you use. Also tell them if you smoke, drink alcohol, or use illegal drugs. Some items may interact with your medicine. What should I watch for while using this medication? Your condition will be monitored carefully while you are receiving this medication. Talk to your care team if you may be pregnant. Serious birth defects can occur if you take this medication during pregnancy and for 3 weeks after the last dose. You will need a negative pregnancy test before starting this medication. Contraception is recommended while taking this medication and for 3 weeks after the last dose. Your care team can help you find the option that works for you. Do not breastfeed while taking this medication and for 3 weeks after the last dose. This medication may cause infertility. Talk to your care team if you are concerned about your fertility. What side effects may I notice from receiving this medication? Side effects that you should report to your care team as soon as possible: Allergic reactions--skin rash, itching, hives, swelling of the face, lips, tongue, or throat Dry cough, shortness of breath or trouble breathing Painful swelling, warmth, or redness of the skin, blisters or sores at the infusion site Side  effects that usually do not require medical attention (report these to your care team if they continue or are bothersome): Fatigue Headache This list may not describe all possible side effects. Call your doctor for medical advice about side effects. You may report side effects to FDA at 1-800-FDA-1088. Where should I keep my medication? This medication is given in a hospital or clinic. It will not be stored at home. NOTE: This sheet is a summary. It may not cover all possible information. If you have questions about this medicine, talk to your doctor, pharmacist, or health care provider.  2023 Elsevier/Gold Standard (2021-09-08 00:00:00)  Durvalumab Injection What is this medication? DURVALUMAB (dur VAL ue mab) treats some types of cancer. It works by helping your immune system slow or stop the spread of cancer cells. It is a monoclonal antibody. This medicine may be used for other purposes; ask your health care provider or pharmacist if you have questions. COMMON BRAND NAME(S): IMFINZI What should I tell my care team before I take this medication? They need to know if you have any of these conditions: Allogeneic stem cell transplant (uses someone else's stem cells) Autoimmune diseases, such as Crohn disease, ulcerative colitis, lupus History of chest radiation Nervous system problems, such as Guillain-Barre syndrome, myasthenia gravis Organ transplant An unusual or allergic reaction to durvalumab,  other medications, foods, dyes, or preservatives Pregnant or trying to get pregnant Breast-feeding How should I use this medication? This medication is infused into a vein. It is given by your care team in a hospital or clinic setting. A special MedGuide will be given to you before each treatment. Be sure to read this information carefully each time. Talk to your care team about the use of this medication in children. Special care may be needed. Overdosage: If you think you have taken too much  of this medicine contact a poison control center or emergency room at once. NOTE: This medicine is only for you. Do not share this medicine with others. What if I miss a dose? Keep appointments for follow-up doses. It is important not to miss your dose. Call your care team if you are unable to keep an appointment. What may interact with this medication? Interactions have not been studied. This list may not describe all possible interactions. Give your health care provider a list of all the medicines, herbs, non-prescription drugs, or dietary supplements you use. Also tell them if you smoke, drink alcohol, or use illegal drugs. Some items may interact with your medicine. What should I watch for while using this medication? Your condition will be monitored carefully while you are receiving this medication. You may need blood work while taking this medication. This medication may cause serious skin reactions. They can happen weeks to months after starting the medication. Contact your care team right away if you notice fevers or flu-like symptoms with a rash. The rash may be red or purple and then turn into blisters or peeling of the skin. You may also notice a red rash with swelling of the face, lips, or lymph nodes in your neck or under your arms. Tell your care team right away if you have any change in your eyesight. Talk to your care team if you may be pregnant. Serious birth defects can occur if you take this medication during pregnancy and for 3 months after the last dose. You will need a negative pregnancy test before starting this medication. Contraception is recommended while taking this medication and for 3 months after the last dose. Your care team can help you find the option that works for you. Do not breastfeed while taking this medication and for 3 months after the last dose. What side effects may I notice from receiving this medication? Side effects that you should report to your care team  as soon as possible: Allergic reactions--skin rash, itching, hives, swelling of the face, lips, tongue, or throat Dry cough, shortness of breath or trouble breathing Eye pain, redness, irritation, or discharge with blurry or decreased vision Heart muscle inflammation--unusual weakness or fatigue, shortness of breath, chest pain, fast or irregular heartbeat, dizziness, swelling of the ankles, feet, or hands Hormone gland problems--headache, sensitivity to light, unusual weakness or fatigue, dizziness, fast or irregular heartbeat, increased sensitivity to cold or heat, excessive sweating, constipation, hair loss, increased thirst or amount of urine, tremors or shaking, irritability Infusion reactions--chest pain, shortness of breath or trouble breathing, feeling faint or lightheaded Kidney injury (glomerulonephritis)--decrease in the amount of urine, red or dark brown urine, foamy or bubbly urine, swelling of the ankles, hands, or feet Liver injury--right upper belly pain, loss of appetite, nausea, light-colored stool, dark yellow or brown urine, yellowing skin or eyes, unusual weakness or fatigue Pain, tingling, or numbness in the hands or feet, muscle weakness, change in vision, confusion or trouble speaking, loss of  balance or coordination, trouble walking, seizures Rash, fever, and swollen lymph nodes Redness, blistering, peeling, or loosening of the skin, including inside the mouth Sudden or severe stomach pain, bloody diarrhea, fever, nausea, vomiting Side effects that usually do not require medical attention (report these to your care team if they continue or are bothersome): Bone, joint, or muscle pain Diarrhea Fatigue Loss of appetite Nausea Skin rash This list may not describe all possible side effects. Call your doctor for medical advice about side effects. You may report side effects to FDA at 1-800-FDA-1088. Where should I keep my medication? This medication is given in a hospital or  clinic. It will not be stored at home. NOTE: This sheet is a summary. It may not cover all possible information. If you have questions about this medicine, talk to your doctor, pharmacist, or health care provider.  2023 Elsevier/Gold Standard (2021-05-09 00:00:00)  Carboplatin Injection What is this medication? CARBOPLATIN (KAR boe pla tin) treats some types of cancer. It works by slowing down the growth of cancer cells. This medicine may be used for other purposes; ask your health care provider or pharmacist if you have questions. COMMON BRAND NAME(S): Paraplatin What should I tell my care team before I take this medication? They need to know if you have any of these conditions: Blood disorders Hearing problems Kidney disease Recent or ongoing radiation therapy An unusual or allergic reaction to carboplatin, cisplatin, other medications, foods, dyes, or preservatives Pregnant or trying to get pregnant Breast-feeding How should I use this medication? This medication is injected into a vein. It is given by your care team in a hospital or clinic setting. Talk to your care team about the use of this medication in children. Special care may be needed. Overdosage: If you think you have taken too much of this medicine contact a poison control center or emergency room at once. NOTE: This medicine is only for you. Do not share this medicine with others. What if I miss a dose? Keep appointments for follow-up doses. It is important not to miss your dose. Call your care team if you are unable to keep an appointment. What may interact with this medication? Medications for seizures Some antibiotics, such as amikacin, gentamicin, neomycin, streptomycin, tobramycin Vaccines This list may not describe all possible interactions. Give your health care provider a list of all the medicines, herbs, non-prescription drugs, or dietary supplements you use. Also tell them if you smoke, drink alcohol, or use  illegal drugs. Some items may interact with your medicine. What should I watch for while using this medication? Your condition will be monitored carefully while you are receiving this medication. You may need blood work while taking this medication. This medication may make you feel generally unwell. This is not uncommon, as chemotherapy can affect healthy cells as well as cancer cells. Report any side effects. Continue your course of treatment even though you feel ill unless your care team tells you to stop. In some cases, you may be given additional medications to help with side effects. Follow all directions for their use. This medication may increase your risk of getting an infection. Call your care team for advice if you get a fever, chills, sore throat, or other symptoms of a cold or flu. Do not treat yourself. Try to avoid being around people who are sick. Avoid taking medications that contain aspirin, acetaminophen, ibuprofen, naproxen, or ketoprofen unless instructed by your care team. These medications may hide a fever. Be careful  brushing or flossing your teeth or using a toothpick because you may get an infection or bleed more easily. If you have any dental work done, tell your dentist you are receiving this medication. Talk to your care team if you wish to become pregnant or think you might be pregnant. This medication can cause serious birth defects. Talk to your care team about effective forms of contraception. Do not breast-feed while taking this medication. What side effects may I notice from receiving this medication? Side effects that you should report to your care team as soon as possible: Allergic reactions--skin rash, itching, hives, swelling of the face, lips, tongue, or throat Infection--fever, chills, cough, sore throat, wounds that don't heal, pain or trouble when passing urine, general feeling of discomfort or being unwell Low red blood cell level--unusual weakness or  fatigue, dizziness, headache, trouble breathing Pain, tingling, or numbness in the hands or feet, muscle weakness, change in vision, confusion or trouble speaking, loss of balance or coordination, trouble walking, seizures Unusual bruising or bleeding Side effects that usually do not require medical attention (report to your care team if they continue or are bothersome): Hair loss Nausea Unusual weakness or fatigue Vomiting This list may not describe all possible side effects. Call your doctor for medical advice about side effects. You may report side effects to FDA at 1-800-FDA-1088. Where should I keep my medication? This medication is given in a hospital or clinic. It will not be stored at home. NOTE: This sheet is a summary. It may not cover all possible information. If you have questions about this medicine, talk to your doctor, pharmacist, or health care provider.  2023 Elsevier/Gold Standard (2021-05-02 00:00:00)  Etoposide Injection What is this medication? ETOPOSIDE (e toe POE side) treats some types of cancer. It works by slowing down the growth of cancer cells. This medicine may be used for other purposes; ask your health care provider or pharmacist if you have questions. COMMON BRAND NAME(S): Etopophos, Toposar, VePesid What should I tell my care team before I take this medication? They need to know if you have any of these conditions: Infection Kidney disease Liver disease Low blood counts, such as low white cell, platelet, red cell counts An unusual or allergic reaction to etoposide, other medications, foods, dyes, or preservatives If you or your partner are pregnant or trying to get pregnant Breastfeeding How should I use this medication? This medication is injected into a vein. It is given by your care team in a hospital or clinic setting. Talk to your care team about the use of this medication in children. Special care may be needed. Overdosage: If you think you have  taken too much of this medicine contact a poison control center or emergency room at once. NOTE: This medicine is only for you. Do not share this medicine with others. What if I miss a dose? Keep appointments for follow-up doses. It is important not to miss your dose. Call your care team if you are unable to keep an appointment. What may interact with this medication? Warfarin This list may not describe all possible interactions. Give your health care provider a list of all the medicines, herbs, non-prescription drugs, or dietary supplements you use. Also tell them if you smoke, drink alcohol, or use illegal drugs. Some items may interact with your medicine. What should I watch for while using this medication? Your condition will be monitored carefully while you are receiving this medication. This medication may make you feel generally  unwell. This is not uncommon as chemotherapy can affect healthy cells as well as cancer cells. Report any side effects. Continue your course of treatment even though you feel ill unless your care team tells you to stop. This medication can cause serious side effects. To reduce the risk, your care team may give you other medications to take before receiving this one. Be sure to follow the directions from your care team. This medication may increase your risk of getting an infection. Call your care team for advice if you get a fever, chills, sore throat, or other symptoms of a cold or flu. Do not treat yourself. Try to avoid being around people who are sick. This medication may increase your risk to bruise or bleed. Call your care team if you notice any unusual bleeding. Talk to your care team about your risk of cancer. You may be more at risk for certain types of cancers if you take this medication. Talk to your care team if you may be pregnant. Serious birth defects can occur if you take this medication during pregnancy and for 6 months after the last dose. You will need  a negative pregnancy test before starting this medication. Contraception is recommended while taking this medication and for 6 months after the last dose. Your care team can help you find the option that works for you. If your partner can get pregnant, use a condom during sex while taking this medication and for 4 months after the last dose. Do not breastfeed while taking this medication. This medication may cause infertility. Talk to your care team if you are concerned about your fertility. What side effects may I notice from receiving this medication? Side effects that you should report to your care team as soon as possible: Allergic reactions--skin rash, itching, hives, swelling of the face, lips, tongue, or throat Infection--fever, chills, cough, sore throat, wounds that don't heal, pain or trouble when passing urine, general feeling of discomfort or being unwell Low red blood cell level--unusual weakness or fatigue, dizziness, headache, trouble breathing Unusual bruising or bleeding Side effects that usually do not require medical attention (report to your care team if they continue or are bothersome): Diarrhea Fatigue Hair loss Loss of appetite Nausea Vomiting This list may not describe all possible side effects. Call your doctor for medical advice about side effects. You may report side effects to FDA at 1-800-FDA-1088. Where should I keep my medication? This medication is given in a hospital or clinic. It will not be stored at home. NOTE: This sheet is a summary. It may not cover all possible information. If you have questions about this medicine, talk to your doctor, pharmacist, or health care provider.  2023 Elsevier/Gold Standard (2007-03-09 00:00:00)  Hypokalemia Hypokalemia means that the amount of potassium in the blood is lower than normal. Potassium is a mineral (electrolyte) that helps regulate the amount of fluid in the body. It also stimulates muscle tightening  (contraction) and helps nerves work properly. Normally, most of the body's potassium is inside cells, and only a very small amount is in the blood. Because the amount in the blood is so small, minor changes to potassium levels in the blood can be life-threatening. What are the causes? This condition may be caused by: Antibiotic medicine. Diarrhea or vomiting. Taking too much of a medicine that helps you have a bowel movement (laxative) can cause diarrhea and lead to hypokalemia. Chronic kidney disease (CKD). Medicines that help the body get rid of excess  fluid (diuretics). Eating disorders, such as anorexia or bulimia. Low magnesium levels in the body. Sweating a lot. What are the signs or symptoms? Symptoms of this condition include: Weakness. Constipation. Fatigue. Muscle cramps. Mental confusion. Skipped heartbeats or irregular heartbeat (palpitations). Tingling or numbness. How is this diagnosed? This condition is diagnosed with a blood test. How is this treated? This condition may be treated by: Taking potassium supplements. Adjusting the medicines that you take. Eating more foods that contain a lot of potassium. If your potassium level is very low, you may need to get potassium through an IV and be monitored in the hospital. Follow these instructions at home: Eating and drinking  Eat a healthy diet. A healthy diet includes fresh fruits and vegetables, whole grains, healthy fats, and lean proteins. If told, eat more foods that contain a lot of potassium. These include: Nuts, such as peanuts and pistachios. Seeds, such as sunflower seeds and pumpkin seeds. Peas, lentils, and lima beans. Whole grain and bran cereals and breads. Fresh fruits and vegetables, such as apricots, avocado, bananas, cantaloupe, kiwi, oranges, tomatoes, asparagus, and potatoes. Juices, such as orange, tomato, and prune. Lean meats, including fish. Milk and milk products, such as yogurt. General  instructions Take over-the-counter and prescription medicines only as told by your health care provider. This includes vitamins, natural food products, and supplements. Keep all follow-up visits. This is important. Contact a health care provider if: You have weakness that gets worse. You feel your heart pounding or racing. You vomit. You have diarrhea. You have diabetes and you have trouble keeping your blood sugar in your target range. Get help right away if: You have chest pain. You have shortness of breath. You have vomiting or diarrhea that lasts for more than 2 days. You faint. These symptoms may be an emergency. Get help right away. Call 911. Do not wait to see if the symptoms will go away. Do not drive yourself to the hospital. Summary Hypokalemia means that the amount of potassium in the blood is lower than normal. This condition is diagnosed with a blood test. Hypokalemia may be treated by taking potassium supplements, adjusting the medicines that you take, or eating more foods that are high in potassium. If your potassium level is very low, you may need to get potassium through an IV and be monitored in the hospital. This information is not intended to replace advice given to you by your health care provider. Make sure you discuss any questions you have with your health care provider. Document Revised: 09/30/2020 Document Reviewed: 09/30/2020 Elsevier Patient Education  [place].

## 2022-01-25 NOTE — Progress Notes (Signed)
Per Dr Julien Nordmann ok to proceed with treatment with today's lab values.

## 2022-01-25 NOTE — Telephone Encounter (Signed)
CRITICAL VALUE STICKER  CRITICAL VALUE: K+-2.0           AST-176           Bicarb O2  Greater than 45  RECEIVER (on-site recipient of call): Mykaela Arena P. LPN  DATE & TIME NOTIFIED: 01/25/22 9:33am  MESSENGER (representative from lab): Heather      MD NOTIFIED:  Dr. Julien Nordmann

## 2022-01-26 ENCOUNTER — Inpatient Hospital Stay: Payer: Medicare Other

## 2022-01-26 ENCOUNTER — Other Ambulatory Visit: Payer: Self-pay

## 2022-01-26 ENCOUNTER — Encounter (HOSPITAL_COMMUNITY): Payer: Self-pay | Admitting: Family Medicine

## 2022-01-26 ENCOUNTER — Inpatient Hospital Stay (HOSPITAL_BASED_OUTPATIENT_CLINIC_OR_DEPARTMENT_OTHER): Payer: Medicare Other | Admitting: Physician Assistant

## 2022-01-26 ENCOUNTER — Telehealth: Payer: Self-pay

## 2022-01-26 ENCOUNTER — Emergency Department (HOSPITAL_COMMUNITY): Payer: Medicare Other

## 2022-01-26 ENCOUNTER — Inpatient Hospital Stay (HOSPITAL_COMMUNITY)
Admission: EM | Admit: 2022-01-26 | Discharge: 2022-01-29 | DRG: 193 | Disposition: A | Discharge status: Home / Self Care | Payer: Medicare Other | Source: Ambulatory Visit | Attending: Family Medicine | Admitting: Family Medicine

## 2022-01-26 VITALS — BP 149/84 | HR 90 | Temp 97.8°F | Resp 18

## 2022-01-26 DIAGNOSIS — C3490 Malignant neoplasm of unspecified part of unspecified bronchus or lung: Secondary | ICD-10-CM

## 2022-01-26 DIAGNOSIS — C7931 Secondary malignant neoplasm of brain: Secondary | ICD-10-CM | POA: Diagnosis present

## 2022-01-26 DIAGNOSIS — K219 Gastro-esophageal reflux disease without esophagitis: Secondary | ICD-10-CM | POA: Diagnosis present

## 2022-01-26 DIAGNOSIS — Z1152 Encounter for screening for COVID-19: Secondary | ICD-10-CM | POA: Diagnosis not present

## 2022-01-26 DIAGNOSIS — Z825 Family history of asthma and other chronic lower respiratory diseases: Secondary | ICD-10-CM | POA: Diagnosis not present

## 2022-01-26 DIAGNOSIS — Y92239 Unspecified place in hospital as the place of occurrence of the external cause: Secondary | ICD-10-CM | POA: Diagnosis present

## 2022-01-26 DIAGNOSIS — R7401 Elevation of levels of liver transaminase levels: Secondary | ICD-10-CM | POA: Diagnosis present

## 2022-01-26 DIAGNOSIS — R0902 Hypoxemia: Secondary | ICD-10-CM | POA: Diagnosis not present

## 2022-01-26 DIAGNOSIS — J90 Pleural effusion, not elsewhere classified: Secondary | ICD-10-CM | POA: Diagnosis not present

## 2022-01-26 DIAGNOSIS — I5032 Chronic diastolic (congestive) heart failure: Secondary | ICD-10-CM | POA: Diagnosis present

## 2022-01-26 DIAGNOSIS — I255 Ischemic cardiomyopathy: Secondary | ICD-10-CM | POA: Diagnosis present

## 2022-01-26 DIAGNOSIS — J439 Emphysema, unspecified: Secondary | ICD-10-CM | POA: Diagnosis present

## 2022-01-26 DIAGNOSIS — Z951 Presence of aortocoronary bypass graft: Secondary | ICD-10-CM | POA: Diagnosis not present

## 2022-01-26 DIAGNOSIS — C787 Secondary malignant neoplasm of liver and intrahepatic bile duct: Secondary | ICD-10-CM | POA: Diagnosis present

## 2022-01-26 DIAGNOSIS — T451X5A Adverse effect of antineoplastic and immunosuppressive drugs, initial encounter: Secondary | ICD-10-CM | POA: Diagnosis not present

## 2022-01-26 DIAGNOSIS — Z87891 Personal history of nicotine dependence: Secondary | ICD-10-CM | POA: Diagnosis not present

## 2022-01-26 DIAGNOSIS — Z79899 Other long term (current) drug therapy: Secondary | ICD-10-CM

## 2022-01-26 DIAGNOSIS — E785 Hyperlipidemia, unspecified: Secondary | ICD-10-CM | POA: Diagnosis present

## 2022-01-26 DIAGNOSIS — Z7984 Long term (current) use of oral hypoglycemic drugs: Secondary | ICD-10-CM

## 2022-01-26 DIAGNOSIS — I2510 Atherosclerotic heart disease of native coronary artery without angina pectoris: Secondary | ICD-10-CM | POA: Diagnosis present

## 2022-01-26 DIAGNOSIS — J440 Chronic obstructive pulmonary disease with acute lower respiratory infection: Secondary | ICD-10-CM | POA: Diagnosis present

## 2022-01-26 DIAGNOSIS — R7989 Other specified abnormal findings of blood chemistry: Secondary | ICD-10-CM | POA: Diagnosis not present

## 2022-01-26 DIAGNOSIS — J189 Pneumonia, unspecified organism: Secondary | ICD-10-CM | POA: Diagnosis present

## 2022-01-26 DIAGNOSIS — J441 Chronic obstructive pulmonary disease with (acute) exacerbation: Secondary | ICD-10-CM | POA: Diagnosis present

## 2022-01-26 DIAGNOSIS — Z7902 Long term (current) use of antithrombotics/antiplatelets: Secondary | ICD-10-CM

## 2022-01-26 DIAGNOSIS — Z85118 Personal history of other malignant neoplasm of bronchus and lung: Secondary | ICD-10-CM

## 2022-01-26 DIAGNOSIS — J168 Pneumonia due to other specified infectious organisms: Secondary | ICD-10-CM | POA: Diagnosis not present

## 2022-01-26 DIAGNOSIS — Z8249 Family history of ischemic heart disease and other diseases of the circulatory system: Secondary | ICD-10-CM | POA: Diagnosis not present

## 2022-01-26 DIAGNOSIS — C3432 Malignant neoplasm of lower lobe, left bronchus or lung: Secondary | ICD-10-CM | POA: Diagnosis not present

## 2022-01-26 DIAGNOSIS — E876 Hypokalemia: Secondary | ICD-10-CM | POA: Diagnosis present

## 2022-01-26 DIAGNOSIS — I110 Hypertensive heart disease with heart failure: Secondary | ICD-10-CM | POA: Diagnosis present

## 2022-01-26 DIAGNOSIS — Z902 Acquired absence of lung [part of]: Secondary | ICD-10-CM | POA: Diagnosis not present

## 2022-01-26 DIAGNOSIS — Z7982 Long term (current) use of aspirin: Secondary | ICD-10-CM | POA: Diagnosis not present

## 2022-01-26 DIAGNOSIS — J9601 Acute respiratory failure with hypoxia: Secondary | ICD-10-CM | POA: Diagnosis present

## 2022-01-26 DIAGNOSIS — J9621 Acute and chronic respiratory failure with hypoxia: Secondary | ICD-10-CM | POA: Diagnosis present

## 2022-01-26 DIAGNOSIS — T380X5A Adverse effect of glucocorticoids and synthetic analogues, initial encounter: Secondary | ICD-10-CM | POA: Diagnosis not present

## 2022-01-26 DIAGNOSIS — C801 Malignant (primary) neoplasm, unspecified: Secondary | ICD-10-CM | POA: Diagnosis not present

## 2022-01-26 DIAGNOSIS — C7951 Secondary malignant neoplasm of bone: Secondary | ICD-10-CM | POA: Diagnosis not present

## 2022-01-26 DIAGNOSIS — D6959 Other secondary thrombocytopenia: Secondary | ICD-10-CM | POA: Diagnosis present

## 2022-01-26 DIAGNOSIS — C3492 Malignant neoplasm of unspecified part of left bronchus or lung: Secondary | ICD-10-CM

## 2022-01-26 DIAGNOSIS — R739 Hyperglycemia, unspecified: Secondary | ICD-10-CM | POA: Diagnosis not present

## 2022-01-26 LAB — CBC WITH DIFFERENTIAL/PLATELET
Abs Immature Granulocytes: 0.26 10*3/uL — ABNORMAL HIGH (ref 0.00–0.07)
Basophils Absolute: 0 10*3/uL (ref 0.0–0.1)
Basophils Relative: 0 %
Eosinophils Absolute: 0 10*3/uL (ref 0.0–0.5)
Eosinophils Relative: 0 %
HCT: 37 % — ABNORMAL LOW (ref 39.0–52.0)
Hemoglobin: 12.2 g/dL — ABNORMAL LOW (ref 13.0–17.0)
Immature Granulocytes: 2 %
Lymphocytes Relative: 3 %
Lymphs Abs: 0.3 10*3/uL — ABNORMAL LOW (ref 0.7–4.0)
MCH: 33 pg (ref 26.0–34.0)
MCHC: 33 g/dL (ref 30.0–36.0)
MCV: 100 fL (ref 80.0–100.0)
Monocytes Absolute: 0.5 10*3/uL (ref 0.1–1.0)
Monocytes Relative: 4 %
Neutro Abs: 10.7 10*3/uL — ABNORMAL HIGH (ref 1.7–7.7)
Neutrophils Relative %: 91 %
Platelets: 100 10*3/uL — ABNORMAL LOW (ref 150–400)
RBC: 3.7 MIL/uL — ABNORMAL LOW (ref 4.22–5.81)
RDW: 14.2 % (ref 11.5–15.5)
WBC: 11.7 10*3/uL — ABNORMAL HIGH (ref 4.0–10.5)
nRBC: 0 % (ref 0.0–0.2)

## 2022-01-26 LAB — T4: T4, Total: 4.2 ug/dL — ABNORMAL LOW (ref 4.5–12.0)

## 2022-01-26 LAB — BRAIN NATRIURETIC PEPTIDE: B Natriuretic Peptide: 545.6 pg/mL — ABNORMAL HIGH (ref 0.0–100.0)

## 2022-01-26 LAB — TROPONIN I (HIGH SENSITIVITY)
Troponin I (High Sensitivity): 74 ng/L — ABNORMAL HIGH (ref <18)
Troponin I (High Sensitivity): 62 ng/L — ABNORMAL HIGH (ref <18)

## 2022-01-26 LAB — COMPREHENSIVE METABOLIC PANEL
ALT: 214 U/L — ABNORMAL HIGH (ref 0–44)
AST: 183 U/L — ABNORMAL HIGH (ref 15–41)
Albumin: 3.1 g/dL — ABNORMAL LOW (ref 3.5–5.0)
Alkaline Phosphatase: 201 U/L — ABNORMAL HIGH (ref 38–126)
Anion gap: 9 (ref 5–15)
BUN: 39 mg/dL — ABNORMAL HIGH (ref 8–23)
CO2: 37 mmol/L — ABNORMAL HIGH (ref 22–32)
Calcium: 8.1 mg/dL — ABNORMAL LOW (ref 8.9–10.3)
Chloride: 95 mmol/L — ABNORMAL LOW (ref 98–111)
Creatinine, Ser: 0.77 mg/dL (ref 0.61–1.24)
GFR, Estimated: >60 mL/min (ref >60)
Glucose, Bld: 119 mg/dL — ABNORMAL HIGH (ref 70–99)
Potassium: 2.1 mmol/L — CL (ref 3.5–5.1)
Sodium: 141 mmol/L (ref 135–145)
Total Bilirubin: 1.1 mg/dL (ref 0.3–1.2)
Total Protein: 5.7 g/dL — ABNORMAL LOW (ref 6.5–8.1)

## 2022-01-26 LAB — RESP PANEL BY RT-PCR (RSV, FLU A&B, COVID)  RVPGX2
Influenza A by PCR: NEGATIVE
Influenza B by PCR: NEGATIVE
Resp Syncytial Virus by PCR: NEGATIVE
SARS Coronavirus 2 by RT PCR: NEGATIVE

## 2022-01-26 MED ORDER — GUAIFENESIN 100 MG/5ML PO LIQD: 5.0000 mL | ORAL | Status: DC | PRN

## 2022-01-26 MED ORDER — POTASSIUM CHLORIDE CRYS ER 20 MEQ PO TBCR
60.0000 meq | EXTENDED_RELEASE_TABLET | Freq: Once | ORAL | Status: AC
  Administered 2022-01-26: 60 meq via ORAL
  Filled 2022-01-26: qty 3

## 2022-01-26 MED ORDER — SODIUM CHLORIDE 0.9 % IV SOLN
100.0000 mg/m2 | Freq: Once | INTRAVENOUS | Status: AC
  Administered 2022-01-26: 180 mg via INTRAVENOUS
  Filled 2022-01-26: qty 9

## 2022-01-26 MED ORDER — HEPARIN SOD (PORK) LOCK FLUSH 100 UNIT/ML IV SOLN: 500.0000 [IU] | Freq: Once | INTRAVENOUS | Status: DC | PRN

## 2022-01-26 MED ORDER — IPRATROPIUM-ALBUTEROL 0.5-2.5 (3) MG/3ML IN SOLN
3.0000 mL | RESPIRATORY_TRACT | Status: AC
  Administered 2022-01-26 (×2): 3 mL via RESPIRATORY_TRACT
  Filled 2022-01-26: qty 6

## 2022-01-26 MED ORDER — IPRATROPIUM-ALBUTEROL 0.5-2.5 (3) MG/3ML IN SOLN: 3.0000 mL | RESPIRATORY_TRACT | Status: DC | PRN

## 2022-01-26 MED ORDER — LOSARTAN POTASSIUM 25 MG PO TABS
25.0000 mg | ORAL_TABLET | Freq: Every day | ORAL | Status: DC
  Administered 2022-01-26 – 2022-01-29 (×4): 25 mg via ORAL
  Filled 2022-01-26 (×4): qty 1

## 2022-01-26 MED ORDER — SODIUM CHLORIDE 0.9% FLUSH
10.0000 mL | INTRAVENOUS | Status: DC | PRN
  Administered 2022-01-26: 10 mL

## 2022-01-26 MED ORDER — OXYCODONE HCL 5 MG PO TABS: 5.0000 mg | ORAL_TABLET | ORAL | Status: DC | PRN

## 2022-01-26 MED ORDER — SODIUM CHLORIDE 0.9 % IV SOLN
500.0000 mg | Freq: Once | INTRAVENOUS | Status: AC
  Administered 2022-01-26: 500 mg via INTRAVENOUS
  Filled 2022-01-26: qty 5

## 2022-01-26 MED ORDER — ONDANSETRON HCL 4 MG PO TABS: 4.0000 mg | ORAL_TABLET | Freq: Four times a day (QID) | ORAL | Status: DC | PRN

## 2022-01-26 MED ORDER — AZITHROMYCIN 250 MG PO TABS
500.0000 mg | ORAL_TABLET | Freq: Every day | ORAL | Status: DC
  Administered 2022-01-27 – 2022-01-29 (×3): 500 mg via ORAL
  Filled 2022-01-26 (×3): qty 2

## 2022-01-26 MED ORDER — IBUPROFEN 200 MG PO TABS: 400.0000 mg | ORAL_TABLET | Freq: Four times a day (QID) | ORAL | Status: DC | PRN

## 2022-01-26 MED ORDER — SODIUM CHLORIDE 0.9 % IV SOLN: Freq: Once | INTRAVENOUS | Status: AC

## 2022-01-26 MED ORDER — METHYLPREDNISOLONE SODIUM SUCC 125 MG IJ SOLR
125.0000 mg | Freq: Once | INTRAMUSCULAR | Status: AC
  Administered 2022-01-26: 125 mg via INTRAVENOUS
  Filled 2022-01-26: qty 2

## 2022-01-26 MED ORDER — SODIUM CHLORIDE 0.9% FLUSH: 3.0000 mL | Freq: Two times a day (BID) | INTRAVENOUS | Status: DC

## 2022-01-26 MED ORDER — SODIUM CHLORIDE 0.9 % IV SOLN
2.0000 g | Freq: Once | INTRAVENOUS | Status: AC
  Administered 2022-01-26: 2 g via INTRAVENOUS
  Filled 2022-01-26: qty 20

## 2022-01-26 MED ORDER — POLYETHYLENE GLYCOL 3350 17 G PO PACK: 17.0000 g | PACK | Freq: Every day | ORAL | Status: DC | PRN

## 2022-01-26 MED ORDER — METHYLPREDNISOLONE SODIUM SUCC 125 MG IJ SOLR
125.0000 mg | Freq: Two times a day (BID) | INTRAMUSCULAR | Status: DC
  Administered 2022-01-27 – 2022-01-28 (×3): 125 mg via INTRAVENOUS
  Filled 2022-01-26 (×3): qty 2

## 2022-01-26 MED ORDER — POTASSIUM CHLORIDE 10 MEQ/100ML IV SOLN
10.0000 meq | INTRAVENOUS | Status: AC
  Administered 2022-01-26 (×3): 10 meq via INTRAVENOUS
  Filled 2022-01-26 (×3): qty 100

## 2022-01-26 MED ORDER — SODIUM CHLORIDE 0.9 % IV SOLN
2.0000 g | INTRAVENOUS | Status: DC
  Administered 2022-01-27 – 2022-01-28 (×2): 2 g via INTRAVENOUS
  Filled 2022-01-26 (×2): qty 20

## 2022-01-26 MED ORDER — SODIUM CHLORIDE 0.9 % IV SOLN
10.0000 mg | Freq: Once | INTRAVENOUS | Status: AC
  Administered 2022-01-26: 10 mg via INTRAVENOUS
  Filled 2022-01-26: qty 10

## 2022-01-26 MED ORDER — SODIUM CHLORIDE 0.9 % IV SOLN
10.0000 mg | Freq: Once | INTRAVENOUS | Status: DC
  Filled 2022-01-26: qty 1

## 2022-01-26 MED ORDER — CARVEDILOL 6.25 MG PO TABS
6.2500 mg | ORAL_TABLET | Freq: Two times a day (BID) | ORAL | Status: DC
  Administered 2022-01-27 – 2022-01-29 (×5): 6.25 mg via ORAL
  Filled 2022-01-26 (×5): qty 1

## 2022-01-26 MED ORDER — MAGNESIUM SULFATE IN D5W 1-5 GM/100ML-% IV SOLN
1.0000 g | Freq: Once | INTRAVENOUS | Status: AC
  Administered 2022-01-27: 1 g via INTRAVENOUS
  Filled 2022-01-26: qty 100

## 2022-01-26 MED ORDER — TRILACICLIB DIHYDROCHLORIDE INJECTION 300 MG
240.0000 mg/m2 | Freq: Once | INTRAVENOUS | Status: AC
  Administered 2022-01-26: 435 mg via INTRAVENOUS
  Filled 2022-01-26: qty 29

## 2022-01-26 MED ORDER — PREDNISONE 5 MG PO TABS: 10.0000 mg | ORAL_TABLET | Freq: Every day | ORAL | Status: DC

## 2022-01-26 MED ORDER — ONDANSETRON HCL 4 MG/2ML IJ SOLN: 4.0000 mg | Freq: Four times a day (QID) | INTRAMUSCULAR | Status: DC | PRN

## 2022-01-26 NOTE — Telephone Encounter (Signed)
-----   Message from Kem Kays, RN sent at 01/25/2022  6:55 PM EST ----- Regarding: First time/imfinzi, carboplatin, etoposide/Dr. Julien Nordmann patient Pt has had carbo and etoposide in the past, but first time for imfinzi today. Pt tolerated well. Thank you!

## 2022-01-26 NOTE — Progress Notes (Signed)
I was asked to see the patient in the infusion room today.  He has recurrent metastatic also lung cancer.  The patient is here today for day 2 cycle 1 of chemotherapy and immunotherapy.  The patient is asymptomatic from a respiratory standpoint he denies any chest pain, leg swelling, shortness of breath, or unusual cough; however, his oxygen saturation is in the 80s on room air which is not typical for him.  He does not have supplemental oxygen at home.  He was in the clinic yesterday for most of the day and his oxygen was always above 90%.  Today, he was placed on supplemental oxygen with 2 L which improved his oxygen to 96% but will go back down into the 80s a few minutes after having the oxygen taken off.  Pulse 71. He did have some rhonchi in the left lung. He denies any systolic heart failure and his last echo was from July 2023 with a EF of 60-65.   I am concerned that his new onset hypoxia.  Differential could include postobstructive pneumonia/pneumonitis, PE, fluid overload, worsening malignancy, etc.  I had a lengthy discussion with the patient today and using shared decision making we decided it would be best to be evaluated in the emergency room for consideration of repeat imaging study to evaluate and manage his hypoxia.  The patient understands why ordering supplemental oxygen is just masking the problem but not solving whatever the underlying etiology is.  The patient was agreeable to go to the emergency room after his infusion today as he is almost completed.  Additionally, yesterday, the patient had several lab abnormalities.  The patient reports that he has been taking Tylenol several times a day due to bone pain from his metastatic bone lesions.  His LFTs were elevated.  Additionally, the patient does have metastatic disease to the liver.  He also has significantly low potassium and received 40 mEq of potassium yesterday.  He also has an outpatient prescription of potassium.  If he does go to  the emergency room, may not be a bad idea to have this rechecked and supplement if needed.

## 2022-01-26 NOTE — H&P (Signed)
History and Physical    Kelly Olson. WLN:[id number] DOB: 27-Jan-1951 DOA: 01/26/2022  PCP: Wenda Low, MD   Patient coming from: Home   Chief Complaint: Cough, hypoxia   HPI: Kelly Olson. is a pleasant 71 y.o. male with medical history significant for CAD, ischemic cardiomyopathy with recovered EF, and recurrent/metastatic small cell lung cancer who presents from the cancer center with cough and hypoxia.  Patient reports that he had been feeling fairly well he developed a cough today while receiving an infusion at the cancer center.  He has had a small amount of sputum production associated with this but has not felt particularly short of breath and denies any chest pain.  He was noted to be saturating in the 80s and was placed on supplemental oxygen.  He remained hypoxic on room air after his infusion and was sent to the ED.  ED Course: Upon arrival to the ED, patient is found to be afebrile, saturating 98% on 3 L/min of supplemental oxygen.  EKG demonstrates sinus rhythm with PACs.  Chest x-ray progressive consolidation within the left base.  Blood work notable for potassium 2.1, bicarbonate 37, alkaline phosphatase 201, AST 183, ALT 214, platelets 100,000, BNP 546, and troponin 74.  Blood cultures were collected in the ED and the patient was given 125 mg of IV Solu-Medrol, oral and IV potassium, DuoNebs, Rocephin, and azithromycin.  Review of Systems:  All other systems reviewed and apart from HPI, are negative.  Past Medical History:  Diagnosis Date   Coronary artery disease, occlusive 06/02/2014   Multivessel CAD.mLAD-100%, mRCA 99%, dRCA 100%, mLCX 90%  And EF 35-45%. -->  Referred for CABG; nonischemic Myoview March 2020   Former heavy tobacco smoker    Quit in April 2016    GERD (gastroesophageal reflux disease)    Hyperlipidemia with target LDL less than 70    Hypertension    Ischemic cardiomyopathy - resolved 05/2014   Myoview: EF ~33% with  "infarction vs. severe resting ischemia in LAD & RCA territory; b) EF by Cath: 35-45%. 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   Stage T2 a, N0    Past Surgical History:  Procedure Laterality Date   CARDIAC CATHETERIZATION N/A 06/02/2014   Procedure: Left Heart Cath and Coronary Angiography;  Surgeon: Leonie Man, MD;  Location: [facility] INVASIVE CV LAB CUPID;  Service: Cardiovascular;  mLAD-100%, mRCA 99%, dRCA 100%, mLCX 90%  And EF 35-45%.   CORONARY ARTERY BYPASS GRAFT N/A 06/04/2014   Procedure: CORONARY ARTERY BYPASS GRAFTING (CABG) times four using bilateral internal mammary arteries and EVH for left  leg saphenous vein;  Surgeon: Gaye Pollack, MD;  Location: [place] OR;  Service: Open Heart Surgery;  LIMA-OM, RIMA-LAD, SVG-Diag, SVG-rPDA   IR IMAGING GUIDED PORT INSERTION  05/16/2021   NM MYOVIEW LTD  05/28/2014   Pre-CABG:  High Risk Nuclear Stress Test with multivessel distribution ischemia.  -- Severely ischemic Cardiomyoapthy with evidence of at least 2 vessel disease & EF of ~33%.  Images are consistent with either infarction or severe resting ischemia in the LAD & RCA territory.   NM MYOVIEW LTD  03/2018   EF 50-55%.  Small basal inferior fixed defect consistent with diaphragmatic attenuation.  No ischemia or infarction.  Septal dyskinesis due to post CABG state   TEE WITHOUT CARDIOVERSION N/A 06/04/2014   Procedure: TRANSESOPHAGEAL ECHOCARDIOGRAM (TEE);  Surgeon: Gaye Pollack, MD;  Location: [place];  Service: Open Heart Surgery;  Laterality: N/A;   THORACOTOMY Right 07/10/2016   Procedure: THORACOTOMY;  Surgeon: Gaye Pollack, MD;  Location: [facility] OR;  Service: Thoracic;  Laterality: Right;   THORACOTOMY/LOBECTOMY Right  07/29/2018   Procedure: THORACOTOMY/RIGHT LOWER LOBECTOMY;  Surgeon: Gaye Pollack, MD;  Location: [facility] OR;  Service: Thoracic;  Laterality: Right;   [place] ECHOCARDIOGRAM  10/2014   Mild concentric LVH. EF 50-55% with mild HK of basal anteroseptal myocardium. GR 1 DD.   VIDEO ASSISTED THORACOSCOPY (VATS)/WEDGE RESECTION Right 07/10/2016   Procedure: VIDEO ASSISTED THORACOSCOPY (VATS)/WEDGE RESECTION;  Surgeon: Gaye Pollack, MD;  Location: [place] OR;  Service: Thoracic;  Laterality: Right;   VIDEO BRONCHOSCOPY WITH ENDOBRONCHIAL NAVIGATION N/A 04/13/2021   Procedure: VIDEO BRONCHOSCOPY WITH ENDOBRONCHIAL NAVIGATION;  Surgeon: Melrose Nakayama, MD;  Location: [name];  Service: Thoracic;  Laterality: N/A;    Social History:   reports that he quit smoking about 7 years ago. His smoking use included cigarettes. He has a 36.80 pack-year smoking history. He has never used smokeless tobacco. He reports current alcohol use of about 7.0 standard drinks of alcohol per week. He reports that he does not use drugs.  No Known Allergies  Family History  Problem Relation Age of Onset   Emphysema Mother    Cardiomyopathy Father    Healthy Sister    Heart attack Maternal Grandfather    Healthy Sister    Heart disease Paternal Uncle      Prior to Admission medications   Medication Sig Start Date End Date Taking? Authorizing Provider  acetaminophen (TYLENOL) 325 MG tablet Take 650 mg by mouth every 6 (six) hours as needed for moderate pain.    [provider]  albuterol (VENTOLIN HFA) 108 (90 Base) MCG/ACT inhaler Inhale 2 puffs into the lungs every 6 (six) hours as needed. 12/08/21   [provider]  aspirin 81 MG tablet Take 81 mg by mouth daily.    [provider]  atorvastatin (LIPITOR) 40 MG tablet TAKE 1 TABLET AT BEDTIME Patient taking differently: Take 40 mg by mouth daily. 09/28/21   Leonie Man, MD  carvedilol (COREG) 3.125 MG tablet  TAKE 2 TABLETS TWICE A DAY WITH     MEALS Patient taking differently: Take 6.25 mg by mouth 2 (two) times daily with a meal. 09/28/21   Leonie Man, MD  losartan (COZAAR) 25 MG tablet Take 1 tablet (25 mg total) by mouth daily. 01/03/22   Charlynne Cousins, MD  metFORMIN (GLUCOPHAGE-XR) 500 MG 24 hr tablet Take 500 mg by mouth 2 (two) times daily. 12/08/21   [provider]  potassium chloride SA (KLOR-CON M) 20 MEQ tablet Take 1 tablet (20 mEq total) by mouth 2 (two) times daily. 01/25/22   Heilingoetter, Cassandra L, PA-C  Pseudoephedrine-APAP 30-325 MG TABS Take 1 tablet by mouth daily as needed (sinus congestion). 04/30/21   [provider]  sildenafil (VIAGRA) 50 MG tablet Take 1 tablet (50 mg total) by mouth daily as needed for erectile dysfunction. 02/23/16   Leonie Man, MD  sucralfate (CARAFATE) 1 g tablet Take 1 tablet (1 g total) by mouth 4 (four) times daily -  with meals and at bedtime. 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 Patient not taking: Reported on 01/17/2022 12/20/21   Charlynne Cousins, MD    Physical Exam: Vitals:   01/26/22 2030 01/26/22 2045 01/26/22 2100 01/26/22 2115  BP:      Pulse: (!) 52 67 69 64  Resp: _0 Temp:      TempSrc:      SpO2: 97% 98% 94% 94%  Weight:      Height:         Constitutional: NAD, calm  Eyes: PERTLA, lids and conjunctivae normal ENMT: Mucous membranes are moist. Posterior pharynx clear of any exudate or lesions.   Neck: supple, no masses  Respiratory: Rhonchi on left, wheezing. Speaking full sentences. No accessory muscle use.  Cardiovascular: S1 & S2 heard, regular rate and rhythm. Mild pretibial pitting edema b/l. Abdomen: No distension, no tenderness, soft. Bowel sounds active.  Musculoskeletal: no clubbing / cyanosis. No joint deformity upper and lower extremities.    Skin: no significant rashes, lesions, ulcers. Warm, dry, well-perfused. Neurologic: CN 2-12 grossly intact. Moving all extremities. Alert and oriented.  Psychiatric: Pleasant. Cooperative.    Labs and Imaging on Admission: I have personally reviewed following labs and imaging studies  CBC: Recent Labs  Lab 01/25/22 0758 01/26/22 1831  WBC 12.2* 11.7*  NEUTROABS 10.7* 10.7*  HGB 14.1 12.2*  HCT 41.0 37.0*  MCV 96.2 100.0  PLT 103* 597*   Basic Metabolic Panel: Recent Labs  Lab 01/25/22 0758 01/26/22 1831  NA 143 141  K 2.0* 2.1*  CL 91* 95*  CO2 >45* 37*  GLUCOSE 121* 119*  BUN 37* 39*  CREATININE 1.03 0.77  CALCIUM 8.9 8.1*   GFR: Estimated Creatinine Clearance: 81.5 mL/min (by C-G formula based on SCr of 0.77 mg/dL). Liver Function Tests: Recent Labs  Lab 01/25/22 0758 01/26/22 1831  AST 176* 183*  ALT 207* 214*  ALKPHOS 190* 201*  BILITOT 1.3* 1.1  PROT 6.0* 5.7*  ALBUMIN 3.6 3.1*   No results for input(s): "LIPASE", "AMYLASE" in the last 168 hours. No results for input(s): "AMMONIA" in the last 168 hours. Coagulation Profile: No results for input(s): "INR", "PROTIME" in the last 168 hours. Cardiac Enzymes: No results for input(s): "CKTOTAL", "CKMB", "CKMBINDEX", "TROPONINI" in the last 168 hours. BNP (last 3 results) No results for input(s): "PROBNP" in the last 8760 hours. HbA1C: No results for input(s): "HGBA1C" in the last 72 hours. CBG: No results for input(s): "GLUCAP" in the last 168 hours. Lipid Profile: No results for input(s): "CHOL", "HDL", "LDLCALC", "TRIG", "CHOLHDL", "LDLDIRECT" in the last 72 hours. Thyroid Function Tests: Recent Labs    01/25/22 0758  TSH 0.548  T4TOTAL 4.2*   Anemia Panel: No results for input(s): "VITAMINB12", "FOLATE", "FERRITIN", "TIBC", "IRON", "RETICCTPCT" in the last 72 hours. Urine analysis:    Component Value Date/Time   COLORURINE YELLOW 07/24/2018 1102   APPEARANCEUR CLEAR 07/24/2018 1102    LABSPEC 1.019 07/24/2018 1102   PHURINE 5.0 07/24/2018 1102   GLUCOSEU NEGATIVE 07/24/2018 1102   HGBUR SMALL (A) 07/24/2018 1102   BILIRUBINUR NEGATIVE 07/24/2018 [place] 07/24/2018 1102   PROTEINUR NEGATIVE 07/24/2018 1102   NITRITE NEGATIVE 07/24/2018 1102   LEUKOCYTESUR NEGATIVE 07/24/2018 1102   Sepsis Labs: _1 (procalcitonin:4,lacticidven:4) ) Recent Results (from the past 240 hour(s))  Resp panel by RT-PCR (RSV, Flu A&B, Covid) Anterior Nasal Swab     Status: None   Collection Time: 01/26/22  6:30 PM   Specimen: Anterior Nasal Swab  Result Value Ref Range Status   SARS Coronavirus 2 by RT PCR NEGATIVE NEGATIVE Final    Comment: (NOTE) SARS-CoV-2 target nucleic acids are NOT DETECTED.  The SARS-CoV-2 RNA is generally detectable in upper respiratory specimens during the acute phase of infection. The lowest concentration of SARS-CoV-2 viral copies this assay can detect is 138 copies/mL. A negative result does not preclude SARS-Cov-2 infection and should not be used as the sole basis for treatment or other patient management decisions. A negative result may occur with  improper specimen collection/handling, submission of specimen other than nasopharyngeal swab, presence of viral mutation(s) within the areas targeted by this assay, and inadequate number of viral copies(<138 copies/mL). A negative result must be combined with clinical observations, patient history, and epidemiological information. The expected result is Negative.  Fact Sheet for Patients:  EntrepreneurPulse.com.au  Fact Sheet for Healthcare Providers:  IncredibleEmployment.be  This test is no t yet approved or cleared by the [place] FDA and  has been authorized for detection and/or diagnosis of SARS-CoV-2 by FDA under an Emergency Use Authorization (EUA). This EUA will remain  in effect (meaning this test can be used) for the duration of  the COVID-19 declaration under Section 564(b)(1) of the Act, 21 U.S.C.section 360bbb-3(b)(1), unless the authorization is terminated  or revoked sooner.       Influenza A by PCR NEGATIVE NEGATIVE Final   Influenza B by PCR NEGATIVE NEGATIVE Final    Comment: (NOTE) The Xpert Xpress SARS-CoV-2/FLU/RSV plus assay is intended as an aid in the diagnosis of influenza from Nasopharyngeal swab specimens and should not be used as a sole basis for treatment. Nasal washings and aspirates are unacceptable for Xpert Xpress SARS-CoV-2/FLU/RSV testing.  Fact Sheet for Patients: EntrepreneurPulse.com.au  Fact Sheet for Healthcare Providers: IncredibleEmployment.be  This test is not yet approved or cleared by the [place] FDA and has been authorized for detection and/or diagnosis of SARS-CoV-2 by FDA under an Emergency Use Authorization (EUA). This EUA will remain in effect (meaning this test can be used) for the duration of the COVID-19 declaration under Section 564(b)(1) of the Act, 21 U.S.C. section 360bbb-3(b)(1), unless the authorization is terminated or revoked.     Resp Syncytial Virus by PCR NEGATIVE NEGATIVE Final    Comment: (NOTE) Fact Sheet for Patients: EntrepreneurPulse.com.au  Fact Sheet for Healthcare Providers: IncredibleEmployment.be  This test is not yet approved or cleared by the [place] FDA and has been authorized for detection and/or diagnosis of SARS-CoV-2 by FDA under an Emergency Use Authorization (EUA). This EUA will remain in effect (meaning this test can be used) for the duration of the COVID-19 declaration under Section 564(b)(1) of the Act, 21 U.S.C. section 360bbb-3(b)(1), unless the authorization is terminated or revoked.  Performed at [facility], [place] [address], [place], [place] [address]      Radiological Exams on Admission: DG Chest Portable 1  View  Result Date: 01/26/2022 CLINICAL DATA:  Hypoxia, lung cancer EXAM: PORTABLE CHEST 1 VIEW COMPARISON:  12/14/2021 FINDINGS: There is progressive consolidation within the left lung base initial may be infectious or inflammatory as can be seen with post radiation change in the appropriate clinical setting. Small bilateral pleural effusions are present. No pneumothorax. Right internal jugular chest port tip noted within the superior vena cava.  Coronary artery bypass grafting has been performed. Cardiac size within normal limits. Pulmonary vascularity is normal. No acute bone abnormality. IMPRESSION: 1. Progressive consolidation within the left lung base, possibly infectious or inflammatory as can be seen with post radiation change in the appropriate clinical setting. Electronically Signed   By: Fidela Salisbury M.D.   On: 01/26/2022 18:57    EKG: Independently reviewed. Sinus rhythm, PAC.   Assessment/Plan   1. Pneumonia; COPD exacerbation; acute hypoxic respiratory failure  - Blood cultures were collected in the ED and he was treated with Solu-Medrol, Duonebs, Rocephin, and azithromycin  - Culture sputum, check strep pneumo and legionella antigen, continue systemic steroids and antibiotics, continue supplemental O2 as needed   2. Lung cancer  - Recurrent/metastatic small cell lung cancer under the care of Dr. Julien Nordmann and currently being treated with carboplatin and etoposide as well as Imfinzi; there is also plan for additional radiation treatments    3. Chronic diastolic CHF  - Appears compensated  - Monitor weight and I/Os    4. Elevated LFTs  - Alk phos 201, AST 183, ALT 214, and t bili normal on admission; LFTs were normal on 01/02/22  - PET findings from 01/12/22 highly concerning for liver mets which is likely etiology for this  - Check acetaminophen level and viral hepatitis panel, hold Lipitor for now, trend LFTs    5. Hypokalemia  - Serum potassium is 2.1 on admission  - Replacing     6. Elevated troponin  - Troponin was 74 then 62 in ED   - No chest pain or acute ischemic features on EKG to suggest ACS, likely supply-demand mismatch in setting of acute respiratory failure     DVT prophylaxis: Lovenox  Code Status: Full   Level of Care: Level of care: Telemetry Family Communication: none present  Disposition Plan:  Patient is from: home  Anticipated d/c is to: TBD Anticipated d/c date is: 01/29/22  Patient currently: pending improved respiratory status  Consults called: none  Admission status: Inpatient     Vianne Bulls, MD [facility]  01/26/2022, 10:02 PM

## 2022-01-26 NOTE — Telephone Encounter (Signed)
Kelly Olson states that he is doing fine. He is eating, drinking, and urinating well. He knows to call the office at [phone] if he has any questions or concerns.

## 2022-01-26 NOTE — ED Provider Notes (Signed)
[facility] DEPT Provider Note   CSN: [id number] Arrival date & time: 01/26/22  1724     History  Chief Complaint  Patient presents with   Shortness of Breath    Venetia Night Jayshaun Phillips. is a 71 y.o. male.  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  Of note, Last echo performed July 2023 with evidence of normal LVEF and RV function.  Patient's last chemotherapy earlier today.  He was seen yesterday for hypokalemia and got infusion of potassium.  He was generally feeling well until today he developed a coarse nonproductive cough with chest congestion.  He has no complaints at rest.  No chest pain, no shortness of breath, no hemoptysis.  He does note some new increased ankle swelling of bilateral legs but no pain.  He has no history of PE or DVT.  He has had no vomiting or diarrhea.  He is compliant with his potassium supplementation.  He denies any history of heart failure.  He does note having history of COPD and emphysema from his lung cancer.  He intermittently uses breathing treatments.  He is not on oxygen at home.  His last radiation treatment was back in May.  He has had no fevers.   Shortness of Breath      Home Medications Prior to Admission medications   Medication Sig Start Date End Date Taking? Authorizing Provider  acetaminophen (TYLENOL) 325 MG tablet Take 650 mg by mouth every 6 (six) hours as needed for moderate pain.    [provider]  albuterol (VENTOLIN HFA) 108 (90 Base) MCG/ACT inhaler Inhale 2 puffs into the lungs every 6 (six) hours as needed. 12/08/21   [provider]  aspirin 81 MG tablet Take 81 mg by mouth daily.    [provider]  atorvastatin (LIPITOR) 40 MG tablet TAKE 1 TABLET AT BEDTIME Patient taking differently: Take 40 mg by mouth daily.  09/28/21   Leonie Man, MD  carvedilol (COREG) 3.125 MG tablet TAKE 2 TABLETS TWICE A DAY WITH     MEALS Patient taking differently: Take 6.25 mg by mouth 2 (two) times daily with a meal. 09/28/21   Leonie Man, MD  losartan (COZAAR) 25 MG tablet Take 1 tablet (25 mg total) by mouth daily. 01/03/22   Charlynne Cousins, MD  metFORMIN (GLUCOPHAGE-XR) 500 MG 24 hr tablet Take 500 mg by mouth 2 (two) times daily. 12/08/21   [provider]  potassium chloride SA (KLOR-CON M) 20 MEQ tablet Take 1 tablet (20 mEq total) by mouth 2 (two) times daily. 01/25/22   Heilingoetter, Cassandra L, PA-C  Pseudoephedrine-APAP 30-325 MG TABS Take 1 tablet by mouth daily as needed (sinus congestion). 04/30/21   [provider]  sildenafil (VIAGRA) 50 MG tablet Take 1 tablet (50 mg total) by mouth daily as needed for erectile dysfunction. 02/23/16   Leonie Man, MD  sucralfate (CARAFATE) 1 g tablet Take 1 tablet (1 g total) by mouth 4 (four) times daily -  with meals and at bedtime. 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 Patient not taking: Reported on 01/17/2022 12/20/21   Charlynne Cousins, MD  Allergies    Patient has no known allergies.    Review of Systems   Review of Systems  Respiratory:  Positive for shortness of breath.     Physical Exam Updated Vital Signs BP 137/81   Pulse 64   Temp 97.6 F (36.4 C) (Oral)   Resp 15   Ht 5\' 11"  (1.803 m)   Wt 68 kg   SpO2 94%   BMI 20.92 kg/m  Physical Exam Constitutional: Alert and oriented.  Pleasant male no acute distress Eyes: Conjunctivae are normal. ENT      Head: Normocephalic and atraumatic.      Nose: + congestion.      Mouth/Throat: Mucous membranes are moist.      Neck: No stridor. Cardiovascular: S1, S2, regular rate.Warm and well perfused. Respiratory: Normal respiratory effort.   Significant wheezing and rales worse over the left lung, O2 sat 93% on 3 L nasal cannula, desatted to mid 80s with movement in bed Gastrointestinal: Soft and nontender.  Musculoskeletal: Normal range of motion in all extremities. 2+ nontender nonerythematous pitting edema extending to lower mid shin Neurologic: Normal speech and language.  No facial droop.  Moving all extremities equally.  Sensation grossly intact.  No gross focal neurologic deficits are appreciated. Skin: Skin is warm, dry and intact. No rash noted. Psychiatric: Mood and affect are normal. Speech and behavior are normal.  ED Results / Procedures / Treatments   Labs (all labs ordered are listed, but only abnormal results are displayed) Labs Reviewed  COMPREHENSIVE METABOLIC PANEL - Abnormal; Notable for the following components:      Result Value   Potassium 2.1 (*)    Chloride 95 (*)    CO2 37 (*)    Glucose, Bld 119 (*)    BUN 39 (*)    Calcium 8.1 (*)    Total Protein 5.7 (*)    Albumin 3.1 (*)    AST 183 (*)    ALT 214 (*)    Alkaline Phosphatase 201 (*)    All other components within normal limits  CBC WITH DIFFERENTIAL/PLATELET - Abnormal; Notable for the following components:   WBC 11.7 (*)    RBC 3.70 (*)    Hemoglobin 12.2 (*)    HCT 37.0 (*)    Platelets 100 (*)    Neutro Abs 10.7 (*)    Lymphs Abs 0.3 (*)    Abs Immature Granulocytes 0.26 (*)    All other components within normal limits  BRAIN NATRIURETIC PEPTIDE - Abnormal; Notable for the following components:   B Natriuretic Peptide 545.6 (*)    All other components within normal limits  TROPONIN I (HIGH SENSITIVITY) - Abnormal; Notable for the following components:   Troponin I (High Sensitivity) 74 (*)    All other components within normal limits  TROPONIN I (HIGH SENSITIVITY) - Abnormal; Notable for the following components:   Troponin I (High Sensitivity) 62 (*)    All other components within normal limits  RESP PANEL BY RT-PCR (RSV, FLU  A&B, COVID)  RVPGX2  CULTURE, BLOOD (ROUTINE X 2)  CULTURE, BLOOD (ROUTINE X 2)    EKG EKG Interpretation  Date/Time:  Thursday January 26 2022 20:51:26 EST Ventricular Rate:  69 PR Interval:  125 QRS Duration: 110 QT Interval:  398 QTC Calculation: 427 R Axis:   103 Text Interpretation: Sinus rhythm Atrial premature complex Probable left atrial enlargement Probable right ventricular hypertrophy Nonspecific T abnormalities, lateral leads Confirmed by Georgina Snell [phone]) on  01/26/2022 9:04:10 PM  Radiology DG Chest Portable 1 View  Result Date: 01/26/2022 CLINICAL DATA:  Hypoxia, lung cancer EXAM: PORTABLE CHEST 1 VIEW COMPARISON:  12/14/2021 FINDINGS: There is progressive consolidation within the left lung base initial may be infectious or inflammatory as can be seen with post radiation change in the appropriate clinical setting. Small bilateral pleural effusions are present. No pneumothorax. Right internal jugular chest port tip noted within the superior vena cava. Coronary artery bypass grafting has been performed. Cardiac size within normal limits. Pulmonary vascularity is normal. No acute bone abnormality. IMPRESSION: 1. Progressive consolidation within the left lung base, possibly infectious or inflammatory as can be seen with post radiation change in the appropriate clinical setting. Electronically Signed   By: Fidela Salisbury M.D.   On: 01/26/2022 18:57    Procedures .Critical Care  Performed by: Elgie Congo, MD Authorized by: Elgie Congo, MD   Critical care provider statement:    Critical care time (minutes):  35   Critical care was necessary to treat or prevent imminent or life-threatening deterioration of the following conditions:  Respiratory failure and sepsis   Critical care was time spent personally by me on the following activities:  Development of treatment plan with patient or surrogate, discussions with consultants, evaluation of patient's  response to treatment, examination of patient, ordering and review of laboratory studies, ordering and review of radiographic studies, ordering and performing treatments and interventions, pulse oximetry, re-evaluation of patient's condition, review of old charts and obtaining history from patient or surrogate   Care discussed with: admitting provider       Medications Ordered in ED Medications  azithromycin (ZITHROMAX) 500 mg in sodium chloride 0.9 % 250 mL IVPB (500 mg Intravenous New Bag/Given 01/26/22 2026)  ipratropium-albuterol (DUONEB) 0.5-2.5 (3) MG/3ML nebulizer solution 3 mL (3 mLs Nebulization Not Given 01/26/22 2029)  potassium chloride 10 mEq in 100 mL IVPB (has no administration in time range)  cefTRIAXone (ROCEPHIN) 2 g in sodium chloride 0.9 % 100 mL IVPB (0 g Intravenous Stopped 01/26/22 2116)  methylPREDNISolone sodium succinate (SOLU-MEDROL) 125 mg/2 mL injection 125 mg (125 mg Intravenous Given 01/26/22 2026)  potassium chloride SA (KLOR-CON M) CR tablet 60 mEq (60 mEq Oral Given 01/26/22 2026)    ED Course/ Medical Decision Making/ A&P                           Medical Decision Making [facility] Blythe Hartshorn. is a 71 y.o. male.  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  Of note, Last echo performed July 2023 with evidence of normal LVEF and RV function.  Patient presents hypoxic satting 93 % on 3L [place] not on oxygen at baseline as well as desat to the mid 80s upon movement in the bed.  He has significant wheezing and Rales worse over the left lung.  He also has a significant coarse cough.  Based on patient's history and presentation, presentation could be secondary to worsening lung cancer versus bacterial pneumonia versus viral URI among multiple other etiologies.  Does have pitting edema bilateral lower extremities that  is equal in nature but seems less consistent with fluid overload or CHF exacerbation especially with last echocardiogram performed in July of this year with normal LV and RV function.  Patient's chest  x-ray obtained which I personally reviewed showing increased left lower lobe consolidation concerning for pneumonia.  Suggestive of possible radiation change but think this is less likely as last radiation of the lung was back in May.  He does have leukocytosis 11.7 with left shift.  Do not think patient is septic with no fever, no hemodynamic instability but covered with Rocephin and azithromycin and sent blood cultures.  BNP slightly elevated 545 but no pulmonary edema or pleural effusions.  EKG with nonspecific T wave changes but no chest pain and elevated troponin 70 4 repeat down trended to 62 likely demand ischemia in the setting of suspected COPD exacerbation in the setting of suspected pneumonia.  Potassium 2.1.  Ordered for IV and p.o. repletion.  QTc 434 within normal limits.  Transaminitis AST 23 ALT 214 but suspect secondary to chemotherapy.  Discussed case with Dr. Myna Hidalgo hospitalist who will admit patient for new hypoxia in the setting of suspected pneumonia and oxygen requirement as well as COPD exacerbation and hypokalemia.    Amount and/or Complexity of Data Reviewed Labs: ordered. Radiology: ordered.  Risk Prescription drug management. Decision regarding hospitalization.    Final Clinical Impression(s) / ED Diagnoses Final diagnoses:  Hypoxia  Pneumonia of left lower lobe due to infectious organism  Hypokalemia  Transaminitis  Low blood potassium    Rx / DC Orders ED Discharge Orders     None         Elgie Congo, MD 01/26/22 2122

## 2022-01-26 NOTE — ED Triage Notes (Signed)
Patient transported over from cancer center, Hx of lung cancer. He was receiving infusion when his oxygen sat drop to 85-90% on RA he was placed on 2L O2 and oxygen increased to 97% he is currently on 3L sat at 91%. He developed a dry cough while being transferred over. Per RN patient labs were bad yesterday he had to receive potassium and liver enzymes were elevated. He is scheduled for chemo tomorrow.

## 2022-01-26 NOTE — Progress Notes (Signed)
VS taken on patient's arrival, O2 sat 83% on RA, patient denies any SOB, chest pain or other changes, appears well.  O2 sat checked on different pulse oximeter, remains in 80's.  Patient state he was at another MD office two hours ago & his O2 sat was 94%.  O2 by [place] applied at 2L/min, O2 sat now 96%.  Dr Julien Nordmann informed.  Per Dr Julien Nordmann, he would like patient to rest with O2 on at 2L/min by  during most of his infusion, then RN to recheck O2 sat with O2 off & report back to MD.  Patient's O2 sat 85-90% on RA, seen by Cassie H, PA - patient to go to ED once infusion is complete.  Patty, ED Charge RN informed, patient to go to room 15.

## 2022-01-27 ENCOUNTER — Inpatient Hospital Stay: Payer: Medicare Other

## 2022-01-27 ENCOUNTER — Other Ambulatory Visit: Payer: Self-pay

## 2022-01-27 ENCOUNTER — Ambulatory Visit
Admission: RE | Admit: 2022-01-27 | Discharge: 2022-01-27 | Disposition: A | Discharge status: Home / Self Care | Payer: Medicare Other | Source: Ambulatory Visit | Attending: Radiation Oncology | Admitting: Radiation Oncology

## 2022-01-27 DIAGNOSIS — C3490 Malignant neoplasm of unspecified part of unspecified bronchus or lung: Secondary | ICD-10-CM

## 2022-01-27 DIAGNOSIS — C7931 Secondary malignant neoplasm of brain: Secondary | ICD-10-CM | POA: Diagnosis not present

## 2022-01-27 LAB — COMPREHENSIVE METABOLIC PANEL
ALT: 198 U/L — ABNORMAL HIGH (ref 0–44)
AST: 151 U/L — ABNORMAL HIGH (ref 15–41)
Albumin: 2.8 g/dL — ABNORMAL LOW (ref 3.5–5.0)
Alkaline Phosphatase: 180 U/L — ABNORMAL HIGH (ref 38–126)
Anion gap: 6 (ref 5–15)
BUN: 35 mg/dL — ABNORMAL HIGH (ref 8–23)
CO2: 37 mmol/L — ABNORMAL HIGH (ref 22–32)
Calcium: 7.9 mg/dL — ABNORMAL LOW (ref 8.9–10.3)
Chloride: 97 mmol/L — ABNORMAL LOW (ref 98–111)
Creatinine, Ser: 0.88 mg/dL (ref 0.61–1.24)
GFR, Estimated: >60 mL/min (ref >60)
Glucose, Bld: 227 mg/dL — ABNORMAL HIGH (ref 70–99)
Potassium: 2.7 mmol/L — CL (ref 3.5–5.1)
Sodium: 140 mmol/L (ref 135–145)
Total Bilirubin: 0.9 mg/dL (ref 0.3–1.2)
Total Protein: 5.5 g/dL — ABNORMAL LOW (ref 6.5–8.1)

## 2022-01-27 LAB — HEPATITIS PANEL, ACUTE
HCV Ab: NONREACTIVE
Hep A IgM: NONREACTIVE
Hep B C IgM: NONREACTIVE
Hepatitis B Surface Ag: NONREACTIVE

## 2022-01-27 LAB — CBC
HCT: 35 % — ABNORMAL LOW (ref 39.0–52.0)
Hemoglobin: 11.6 g/dL — ABNORMAL LOW (ref 13.0–17.0)
MCH: 33.3 pg (ref 26.0–34.0)
MCHC: 33.1 g/dL (ref 30.0–36.0)
MCV: 100.6 fL — ABNORMAL HIGH (ref 80.0–100.0)
Platelets: 85 10*3/uL — ABNORMAL LOW (ref 150–400)
RBC: 3.48 MIL/uL — ABNORMAL LOW (ref 4.22–5.81)
RDW: 14.4 % (ref 11.5–15.5)
WBC: 10.7 10*3/uL — ABNORMAL HIGH (ref 4.0–10.5)
nRBC: 0 % (ref 0.0–0.2)

## 2022-01-27 LAB — RAD ONC ARIA SESSION SUMMARY
Course Elapsed Days: 0
Plan Fractions Treated to Date: 1
Plan Prescribed Dose Per Fraction: 20 Gy
Plan Total Fractions Prescribed: 1
Plan Total Prescribed Dose: 20 Gy
Reference Point Dosage Given to Date: 20 Gy
Reference Point Session Dosage Given: 20 Gy
Session Number: 1

## 2022-01-27 LAB — STREP PNEUMONIAE URINARY ANTIGEN: Strep Pneumo Urinary Antigen: NEGATIVE

## 2022-01-27 LAB — MAGNESIUM: Magnesium: 2.2 mg/dL (ref 1.7–2.4)

## 2022-01-27 LAB — ACETAMINOPHEN LEVEL: Acetaminophen (Tylenol), Serum: <10 ug/mL — ABNORMAL LOW (ref 10–30)

## 2022-01-27 MED ORDER — SODIUM CHLORIDE 0.9% FLUSH: 10.0000 mL | INTRAVENOUS | Status: DC | PRN

## 2022-01-27 MED ORDER — SODIUM CHLORIDE 0.9% FLUSH: 10.0000 mL | Freq: Two times a day (BID) | INTRAVENOUS | Status: DC

## 2022-01-27 MED ORDER — POTASSIUM CHLORIDE CRYS ER 20 MEQ PO TBCR
40.0000 meq | EXTENDED_RELEASE_TABLET | Freq: Once | ORAL | Status: AC
  Administered 2022-01-27: 40 meq via ORAL
  Filled 2022-01-27: qty 2

## 2022-01-27 MED ORDER — POTASSIUM CHLORIDE 10 MEQ/100ML IV SOLN
10.0000 meq | INTRAVENOUS | Status: AC
  Administered 2022-01-27 (×4): 10 meq via INTRAVENOUS
  Filled 2022-01-27 (×4): qty 100

## 2022-01-27 MED ORDER — CHLORHEXIDINE GLUCONATE CLOTH 2 % EX PADS
6.0000 | MEDICATED_PAD | Freq: Every day | CUTANEOUS | Status: DC
  Administered 2022-01-27 – 2022-01-29 (×3): 6 via TOPICAL

## 2022-01-27 NOTE — Progress Notes (Signed)
Consultation Progress Note   Patient: Kelly Olson. SWH:[id number] DOB: [birth year] DOA: 01/26/2022 DOS: the patient was seen and examined on 01/27/2022 Primary service: Ivis Henneman, Manfred Shirts, MD  Brief hospital course:  71 y.o. male with medical history significant for CAD, ischemic cardiomyopathy with recovered EF, and recurrent/metastatic small cell lung cancer who presents from the cancer center with cough and hypoxia.   Patient reports that he had been feeling fairly well he developed a cough today while receiving an infusion at the cancer center.  He has had a small amount of sputum production associated with this but has not felt particularly short of breath and denies any chest pain.  He was noted to be saturating in the 80s and was placed on supplemental oxygen.  He remained hypoxic on room air after his infusion and was sent to the ED.  Chest x-ray progressive consolidation within the left base. Blood work notable for potassium 2.1, bicarbonate 37, alkaline phosphatase 201, AST 183, ALT 214, platelets 100,000, BNP 546, and troponin 74.   At ED and the patient was given 125 mg of IV Solu-Medrol, oral and IV potassium, DuoNebs, Rocephin, and azithromycin.   Assessment and Plan: 1. Pneumonia; COPD exacerbation; acute hypoxic respiratory failure  - Blood cultures were collected in the ED and he was treated with Solu-Medrol, Duonebs, Rocephin, and azithromycin  - Culture sputum, check strep pneumo and legionella antigen, continue systemic steroids and antibiotics, continue supplemental O2 as needed   12/29--Afebrile, improved WBC count. Clinically appears stable   2. Lung cancer  - Recurrent/metastatic small cell lung cancer under the care of Dr. Julien Nordmann and currently being treated with carboplatin and etoposide as well as Imfinzi; there is also plan for additional radiation treatments     3. Chronic diastolic CHF  - Appears compensated  - Monitor weight and I/Os     4. Elevated  LFTs  - Alk phos 201, AST 183, ALT 214, and t bili normal on admission; LFTs were normal on 01/02/22  - PET findings from 01/12/22 highly concerning for liver mets which is likely etiology for this  - Check acetaminophen level and viral hepatitis panel, hold Lipitor for now, trend LFTs     5. Hypokalemia  - Serum potassium is 2.1 on admission  - Replacing orally and IV, repeat levels tonight    6. Elevated troponin  - Troponin was 74 then 62 in ED   - No chest pain or acute ischemic features on EKG to suggest ACS, likely supply-demand mismatch in setting of acute respiratory failure       DVT prophylaxis: Lovenox  Code Status: Full   Level of Care: Level of care: Telemetry Family Communication: none present  Disposition Plan:  Patient is from: home  Anticipated d/c is to: TBD Anticipated d/c date is: 01/29/22  Patient currently: pending improved respiratory status  Consults called: none  Admission status: Inpatient        [facility] will continue to follow the patient.  Subjective: Seen this morning, has no complaints. He is hoping to go home tomorrow  Physical Exam: Vitals:   01/27/22 0500 01/27/22 0631 01/27/22 0851 01/27/22 1040  BP:  (!) 164/82  (!) 154/92  Pulse:  65 69 82  Resp:  18  17  Temp:  98.1 F (36.7 C)  98.7 F (37.1 C)  TempSrc:  Oral    SpO2:  95%  90%  Weight: 68 kg     Height:  Constitutional: NAD, calm  Eyes: PERTLA, lids and conjunctivae normal ENMT: Mucous membranes are moist. Posterior pharynx clear of any exudate or lesions.   Neck: supple, no masses  Respiratory: Rhonchi on left, wheezing. Speaking full sentences. No accessory muscle use.  Cardiovascular: S1 & S2 heard, regular rate and rhythm. Mild pretibial pitting edema b/l. Abdomen: No distension, no tenderness, soft. Bowel sounds active.  Musculoskeletal: no clubbing / cyanosis. No joint deformity upper and lower extremities.   Skin: no significant rashes, lesions, ulcers. Warm, dry,  well-perfused. Neurologic: CN 2-12 grossly intact. Moving all extremities. Alert and oriented.  Psychiatric: Pleasant. Cooperative.     Data Reviewed:  There are no new results to review at this time.  Family Communication:   Time spent: 15 minutes.  Author: Cristela Felt, MD 01/27/2022 11:43 AM  For on call review [email or web address].

## 2022-01-28 DIAGNOSIS — J9601 Acute respiratory failure with hypoxia: Secondary | ICD-10-CM | POA: Diagnosis not present

## 2022-01-28 LAB — BASIC METABOLIC PANEL
Anion gap: 11 (ref 5–15)
BUN: 30 mg/dL — ABNORMAL HIGH (ref 8–23)
CO2: 34 mmol/L — ABNORMAL HIGH (ref 22–32)
Calcium: 8.1 mg/dL — ABNORMAL LOW (ref 8.9–10.3)
Chloride: 95 mmol/L — ABNORMAL LOW (ref 98–111)
Creatinine, Ser: 0.78 mg/dL (ref 0.61–1.24)
GFR, Estimated: >60 mL/min (ref >60)
Glucose, Bld: 195 mg/dL — ABNORMAL HIGH (ref 70–99)
Potassium: 3.6 mmol/L (ref 3.5–5.1)
Sodium: 140 mmol/L (ref 135–145)

## 2022-01-28 LAB — CBC
HCT: 37.2 % — ABNORMAL LOW (ref 39.0–52.0)
Hemoglobin: 12.2 g/dL — ABNORMAL LOW (ref 13.0–17.0)
MCH: 33 pg (ref 26.0–34.0)
MCHC: 32.8 g/dL (ref 30.0–36.0)
MCV: 100.5 fL — ABNORMAL HIGH (ref 80.0–100.0)
Platelets: 91 10*3/uL — ABNORMAL LOW (ref 150–400)
RBC: 3.7 MIL/uL — ABNORMAL LOW (ref 4.22–5.81)
RDW: 14 % (ref 11.5–15.5)
WBC: 11.1 10*3/uL — ABNORMAL HIGH (ref 4.0–10.5)
nRBC: 0 % (ref 0.0–0.2)

## 2022-01-28 LAB — COMPREHENSIVE METABOLIC PANEL
ALT: 189 U/L — ABNORMAL HIGH (ref 0–44)
AST: 114 U/L — ABNORMAL HIGH (ref 15–41)
Albumin: 2.7 g/dL — ABNORMAL LOW (ref 3.5–5.0)
Alkaline Phosphatase: 206 U/L — ABNORMAL HIGH (ref 38–126)
Anion gap: 8 (ref 5–15)
BUN: 25 mg/dL — ABNORMAL HIGH (ref 8–23)
CO2: 40 mmol/L — ABNORMAL HIGH (ref 22–32)
Calcium: 8.1 mg/dL — ABNORMAL LOW (ref 8.9–10.3)
Chloride: 94 mmol/L — ABNORMAL LOW (ref 98–111)
Creatinine, Ser: 0.66 mg/dL (ref 0.61–1.24)
GFR, Estimated: >60 mL/min (ref >60)
Glucose, Bld: 152 mg/dL — ABNORMAL HIGH (ref 70–99)
Potassium: 2.6 mmol/L — CL (ref 3.5–5.1)
Sodium: 142 mmol/L (ref 135–145)
Total Bilirubin: 0.9 mg/dL (ref 0.3–1.2)
Total Protein: 5.5 g/dL — ABNORMAL LOW (ref 6.5–8.1)

## 2022-01-28 LAB — MAGNESIUM: Magnesium: 2 mg/dL (ref 1.7–2.4)

## 2022-01-28 MED ORDER — POTASSIUM CHLORIDE 10 MEQ/100ML IV SOLN
10.0000 meq | INTRAVENOUS | Status: AC
  Administered 2022-01-28 (×5): 10 meq via INTRAVENOUS
  Filled 2022-01-28 (×5): qty 100

## 2022-01-28 MED ORDER — POTASSIUM CHLORIDE CRYS ER 20 MEQ PO TBCR
40.0000 meq | EXTENDED_RELEASE_TABLET | Freq: Once | ORAL | Status: AC
  Administered 2022-01-28: 40 meq via ORAL
  Filled 2022-01-28: qty 2

## 2022-01-28 MED ORDER — PHENOL 1.4 % MT LIQD
1.0000 | OROMUCOSAL | Status: DC | PRN
  Administered 2022-01-28: 1 via OROMUCOSAL
  Filled 2022-01-28: qty 354

## 2022-01-28 MED ORDER — POTASSIUM CHLORIDE CRYS ER 20 MEQ PO TBCR: 40.0000 meq | EXTENDED_RELEASE_TABLET | ORAL | Status: DC

## 2022-01-28 MED ORDER — POTASSIUM CHLORIDE CRYS ER 20 MEQ PO TBCR
40.0000 meq | EXTENDED_RELEASE_TABLET | ORAL | Status: AC
  Administered 2022-01-28: 40 meq via ORAL
  Filled 2022-01-28: qty 2

## 2022-01-28 MED ORDER — METHYLPREDNISOLONE SODIUM SUCC 40 MG IJ SOLR
40.0000 mg | Freq: Two times a day (BID) | INTRAMUSCULAR | Status: DC
  Administered 2022-01-28 – 2022-01-29 (×2): 40 mg via INTRAVENOUS
  Filled 2022-01-28 (×2): qty 1

## 2022-01-28 NOTE — Progress Notes (Signed)
  Transition of Care [facility]) Screening Note   Patient Details  Name: Kelly Olson. Date of Birth: [birth year]   Transition of Care [facility]) CM/SW Contact:    Henrietta Dine, RN Phone Number: 01/28/2022, 7:11 PM    Transition of Care Department [facility]) has reviewed patient and no TOC needs have been identified at this time. We will continue to monitor patient advancement through interdisciplinary progression rounds. If new patient transition needs arise, please place a TOC consult.

## 2022-01-28 NOTE — Progress Notes (Signed)
PROGRESS NOTE    Kelly Olson.  GEX:[id number] DOB: 03-22-1950 DOA: 01/26/2022 PCP: Wenda Low, MD  71/M with CAD, ischemic cardiomyopathy with recovered EF, recurrent/metastatic small cell lung cancer undergoing chemo infusion was admitted from cancer center 12/28 with cough and hypoxia, O2 sats were in the mid 80s, placed on 2 L O2 and sent to the emergency room. -Workup in the ED noted left basilar consolidation, also noted to be wheezing, labs were noted below for severe hypokalemia and thrombocytopenia, abnormal LFTs -Improving, severe hypokalemia persists   Subjective: -Feels better overall, mild cough, breathing close to baseline now, still requiring 2 L O2 this morning  Assessment and Plan:  Acute hypoxic respiratory failure Pneumonia, COPD exacerbation -Continue IV Solu-Medrol will cut down dose, DuoNebs -Continue ceftriaxone and azithromycin -Blood cultures are negative, sputum culture pending, COVID and influenza PCR negative -Attempt to wean off O2 today, increase activity -Discharge planning, home tomorrow if stable  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 Earlie Server -Needs prognosis discussion at follow-up, patient has an appointment next week  Chronic diastolic CHF -Appears euvolemic, continue Coreg and losartan -Last echo 7/23 with preserved EF -No indication for diuretics at this time  Abnormal LFTs -Likely secondary to liver mets, chemo/immunotherapy -Holding Lipitor -Trend  Severe hypokalemia -Could be secondary to recent immunotherapy as well, not on diuretics at baseline -Mag level is normal, supplementing IV and oral potassium today -Recheck BMP this afternoon and in a.m.  Hyperglycemia -Likely secondary to steroids -Check HbA1c  Thrombocytopenia -Likely secondary to chemo   DVT prophylaxis: SCDs for thrombocytopenia Code Status: Full code Family  Communication: None present Disposition Plan: Home in 1 to 2 days  Consultants:    Procedures:   Antimicrobials:    Objective: Vitals:   01/28/22 0750 01/28/22 0755 01/28/22 0756 01/28/22 1226  BP: (!) 168/80   137/81  Pulse: 63   79  Resp:    20  Temp:    97.8 F (36.6 C)  TempSrc:      SpO2: 96% (!) 87% 93% (!) 76%  Weight:      Height:        Intake/Output Summary (Last 24 hours) at 01/28/2022 1329 Last data filed at 01/28/2022 0102 Gross per 24 hour  Intake 770 ml  Output 1900 ml  Net -1130 ml   Filed Weights   01/26/22 1735 01/27/22 0500  Weight: 68 kg 68 kg    Examination:  General exam: Chronically ill male sitting up in bed, AAO x 3, no distress HEENT: No JVD, right chest port noted CVS: S1-S2, regular rhythm Lungs: Rhonchi and wheezing in the left lower lung Abdomen: Soft, nontender, bowel sounds present Extremities: No edema  Psychiatry:  Mood & affect appropriate.     Data Reviewed:   CBC: Recent Labs  Lab 01/25/22 0758 01/26/22 1831 01/27/22 0347 01/28/22 0330  WBC 12.2* 11.7* 10.7* 11.1*  NEUTROABS 10.7* 10.7*  --   --   HGB 14.1 12.2* 11.6* 12.2*  HCT 41.0 37.0* 35.0* 37.2*  MCV 96.2 100.0 100.6* 100.5*  PLT 103* 100* 85* 91*   Basic Metabolic Panel: Recent Labs  Lab 01/25/22 0758 01/26/22 1831 01/27/22 0347 01/28/22 0330  NA 143 141 140 142  K 2.0* 2.1* 2.7* 2.6*  CL 91* 95* 97* 94*  CO2 >45* 37* 37* 40*  GLUCOSE 121* 119* 227* 152*  BUN 37* 39* 35* 25*  CREATININE 1.03 0.77 0.88 0.66  CALCIUM  8.9 8.1* 7.9* 8.1*  MG  --   --  2.2 2.0   GFR: Estimated Creatinine Clearance: 81.5 mL/min (by C-G formula based on SCr of 0.66 mg/dL). Liver Function Tests: Recent Labs  Lab 01/25/22 0758 01/26/22 1831 01/27/22 0347 01/28/22 0330  AST 176* 183* 151* 114*  ALT 207* 214* 198* 189*  ALKPHOS 190* 201* 180* 206*  BILITOT 1.3* 1.1 0.9 0.9  PROT 6.0* 5.7* 5.5* 5.5*  ALBUMIN 3.6 3.1* 2.8* 2.7*   No results for input(s):  "LIPASE", "AMYLASE" in the last 168 hours. No results for input(s): "AMMONIA" in the last 168 hours. Coagulation Profile: No results for input(s): "INR", "PROTIME" in the last 168 hours. Cardiac Enzymes: No results for input(s): "CKTOTAL", "CKMB", "CKMBINDEX", "TROPONINI" in the last 168 hours. BNP (last 3 results) No results for input(s): "PROBNP" in the last 8760 hours. HbA1C: No results for input(s): "HGBA1C" in the last 72 hours. CBG: No results for input(s): "GLUCAP" in the last 168 hours. Lipid Profile: No results for input(s): "CHOL", "HDL", "LDLCALC", "TRIG", "CHOLHDL", "LDLDIRECT" in the last 72 hours. Thyroid Function Tests: No results for input(s): "TSH", "T4TOTAL", "FREET4", "T3FREE", "THYROIDAB" in the last 72 hours. Anemia Panel: No results for input(s): "VITAMINB12", "FOLATE", "FERRITIN", "TIBC", "IRON", "RETICCTPCT" in the last 72 hours. Urine analysis:    Component Value Date/Time   COLORURINE YELLOW 07/24/2018 1102   APPEARANCEUR CLEAR 07/24/2018 1102   LABSPEC 1.019 07/24/2018 1102   PHURINE 5.0 07/24/2018 1102   GLUCOSEU NEGATIVE 07/24/2018 1102   HGBUR SMALL (A) 07/24/2018 1102   BILIRUBINUR NEGATIVE 07/24/2018 1102   [place] 07/24/2018 1102   PROTEINUR NEGATIVE 07/24/2018 1102   NITRITE NEGATIVE 07/24/2018 1102   [place] 07/24/2018 1102   Sepsis Labs: @LABRCNTIP (procalcitonin:4,lacticidven:4)  ) Recent Results (from the past 240 hour(s))  Resp panel by RT-PCR (RSV, Flu A&B, Covid) Anterior Nasal Swab     Status: None   Collection Time: 01/26/22  6:30 PM   Specimen: Anterior Nasal Swab  Result Value Ref Range Status   SARS Coronavirus 2 by RT PCR NEGATIVE NEGATIVE Final    Comment: (NOTE) SARS-CoV-2 target nucleic acids are NOT DETECTED.  The SARS-CoV-2 RNA is generally detectable in upper respiratory specimens during the acute phase of infection. The lowest concentration of SARS-CoV-2 viral copies this assay can detect  is 138 copies/mL. A negative result does not preclude SARS-Cov-2 infection and should not be used as the sole basis for treatment or other patient management decisions. A negative result may occur with  improper specimen collection/handling, submission of specimen other than nasopharyngeal swab, presence of viral mutation(s) within the areas targeted by this assay, and inadequate number of viral copies(<138 copies/mL). A negative result must be combined with clinical observations, patient history, and epidemiological information. The expected result is Negative.  Fact Sheet for Patients:  EntrepreneurPulse.com.au  Fact Sheet for Healthcare Providers:  IncredibleEmployment.be  This test is no t yet approved or cleared by the [place] FDA and  has been authorized for detection and/or diagnosis of SARS-CoV-2 by FDA under an Emergency Use Authorization (EUA). This EUA will remain  in effect (meaning this test can be used) for the duration of the COVID-19 declaration under Section 564(b)(1) of the Act, 21 U.S.C.section 360bbb-3(b)(1), unless the authorization is terminated  or revoked sooner.       Influenza A by PCR NEGATIVE NEGATIVE Final   Influenza B by PCR NEGATIVE NEGATIVE Final    Comment: (NOTE) The Xpert Xpress SARS-CoV-2/FLU/RSV plus  assay is intended as an aid in the diagnosis of influenza from Nasopharyngeal swab specimens and should not be used as a sole basis for treatment. Nasal washings and aspirates are unacceptable for Xpert Xpress SARS-CoV-2/FLU/RSV testing.  Fact Sheet for Patients: EntrepreneurPulse.com.au  Fact Sheet for Healthcare Providers: IncredibleEmployment.be  This test is not yet approved or cleared by the [place] FDA and has been authorized for detection and/or diagnosis of SARS-CoV-2 by FDA under an Emergency Use Authorization (EUA). This EUA will remain in effect  (meaning this test can be used) for the duration of the COVID-19 declaration under Section 564(b)(1) of the Act, 21 U.S.C. section 360bbb-3(b)(1), unless the authorization is terminated or revoked.     Resp Syncytial Virus by PCR NEGATIVE NEGATIVE Final    Comment: (NOTE) Fact Sheet for Patients: EntrepreneurPulse.com.au  Fact Sheet for Healthcare Providers: IncredibleEmployment.be  This test is not yet approved or cleared by the [place] FDA and has been authorized for detection and/or diagnosis of SARS-CoV-2 by FDA under an Emergency Use Authorization (EUA). This EUA will remain in effect (meaning this test can be used) for the duration of the COVID-19 declaration under Section 564(b)(1) of the Act, 21 U.S.C. section 360bbb-3(b)(1), unless the authorization is terminated or revoked.  Performed at [facility], [place] [address] [place], [place] [address]   Blood culture (routine x 2)     Status: None (Preliminary result)   Collection Time: 01/26/22  8:01 PM   Specimen: BLOOD  Result Value Ref Range Status   Specimen Description   Final    BLOOD BLOOD LEFT FOREARM Performed at [facility] [address], [place], [place] [address]    Special Requests   Final    BOTTLES DRAWN AEROBIC AND ANAEROBIC Blood Culture adequate volume Performed at [facility] [address], [place], [place] [address]    Culture   Final    NO GROWTH 1 DAY Performed at [facility], [place] [address] [place], [place] [address]    Report Status PENDING  Incomplete  Blood culture (routine x 2)     Status: None (Preliminary result)   Collection Time: 01/26/22  8:05 PM   Specimen: BLOOD  Result Value Ref Range Status   Specimen Description   Final    BLOOD RIGHT PORTA CATH Performed at [facility] [address] [place], [place] [address]    Special Requests   Final     BOTTLES DRAWN AEROBIC AND ANAEROBIC Blood Culture adequate volume Performed at [facility] [address], [place], [place] [address]    Culture   Final    NO GROWTH 1 DAY Performed at [facility], [place] [address], [place], [place] [address]    Report Status PENDING  Incomplete     Radiology Studies: DG Chest Portable 1 View  Result Date: 01/26/2022 CLINICAL DATA:  Hypoxia, lung cancer EXAM: PORTABLE CHEST 1 VIEW COMPARISON:  12/14/2021 FINDINGS: There is progressive consolidation within the left lung base initial may be infectious or inflammatory as can be seen with post radiation change in the appropriate clinical setting. Small bilateral pleural effusions are present. No pneumothorax. Right internal jugular chest port tip noted within the superior vena cava. Coronary artery bypass grafting has been performed. Cardiac size within normal limits. Pulmonary vascularity is normal. No acute bone abnormality. IMPRESSION: 1. Progressive consolidation within the left lung base, possibly infectious or inflammatory as can be  seen with post radiation change in the appropriate clinical setting. Electronically Signed   By: Fidela Salisbury M.D.   On: 01/26/2022 18:57     Scheduled Meds:  azithromycin  500 mg Oral Daily   carvedilol  6.25 mg Oral BID WC   Chlorhexidine Gluconate Cloth  6 each Topical Daily   losartan  25 mg Oral Daily   methylPREDNISolone (SOLU-MEDROL) injection  40 mg Intravenous Q12H   sodium chloride flush  10-40 mL Intracatheter Q12H   sodium chloride flush  3 mL Intravenous Q12H   Continuous Infusions:  cefTRIAXone (ROCEPHIN)  IV 2 g (01/27/22 1945)     LOS: 2 days    Time spent: 54min    Domenic Polite, MD [facility]   01/28/2022, 1:29 PM

## 2022-01-29 ENCOUNTER — Encounter: Payer: Self-pay | Admitting: Cardiology

## 2022-01-29 DIAGNOSIS — J9601 Acute respiratory failure with hypoxia: Secondary | ICD-10-CM | POA: Diagnosis not present

## 2022-01-29 LAB — COMPREHENSIVE METABOLIC PANEL
ALT: 182 U/L — ABNORMAL HIGH (ref 0–44)
AST: 105 U/L — ABNORMAL HIGH (ref 15–41)
Albumin: 2.8 g/dL — ABNORMAL LOW (ref 3.5–5.0)
Alkaline Phosphatase: 223 U/L — ABNORMAL HIGH (ref 38–126)
Anion gap: 9 (ref 5–15)
BUN: 32 mg/dL — ABNORMAL HIGH (ref 8–23)
CO2: 36 mmol/L — ABNORMAL HIGH (ref 22–32)
Calcium: 8 mg/dL — ABNORMAL LOW (ref 8.9–10.3)
Chloride: 98 mmol/L (ref 98–111)
Creatinine, Ser: 0.69 mg/dL (ref 0.61–1.24)
GFR, Estimated: >60 mL/min (ref >60)
Glucose, Bld: 189 mg/dL — ABNORMAL HIGH (ref 70–99)
Potassium: 3.2 mmol/L — ABNORMAL LOW (ref 3.5–5.1)
Sodium: 143 mmol/L (ref 135–145)
Total Bilirubin: 1 mg/dL (ref 0.3–1.2)
Total Protein: 5.3 g/dL — ABNORMAL LOW (ref 6.5–8.1)

## 2022-01-29 LAB — CBC
HCT: 36.1 % — ABNORMAL LOW (ref 39.0–52.0)
Hemoglobin: 11.6 g/dL — ABNORMAL LOW (ref 13.0–17.0)
MCH: 32.5 pg (ref 26.0–34.0)
MCHC: 32.1 g/dL (ref 30.0–36.0)
MCV: 101.1 fL — ABNORMAL HIGH (ref 80.0–100.0)
Platelets: 80 10*3/uL — ABNORMAL LOW (ref 150–400)
RBC: 3.57 MIL/uL — ABNORMAL LOW (ref 4.22–5.81)
RDW: 14 % (ref 11.5–15.5)
WBC: 10 10*3/uL (ref 4.0–10.5)
nRBC: 0 % (ref 0.0–0.2)

## 2022-01-29 MED ORDER — PREDNISONE 20 MG PO TABS: 40.0000 mg | ORAL_TABLET | Freq: Every day | ORAL | 0 refills | Status: AC

## 2022-01-29 MED ORDER — HEPARIN SOD (PORK) LOCK FLUSH 100 UNIT/ML IV SOLN: 500.0000 [IU] | INTRAVENOUS | Status: DC | PRN

## 2022-01-29 MED ORDER — CEPHALEXIN 500 MG PO CAPS: 500.0000 mg | ORAL_CAPSULE | Freq: Four times a day (QID) | ORAL | 0 refills | Status: AC

## 2022-01-29 MED ORDER — POTASSIUM CHLORIDE CRYS ER 20 MEQ PO TBCR
20.0000 meq | EXTENDED_RELEASE_TABLET | Freq: Two times a day (BID) | ORAL | Status: DC
  Administered 2022-01-29: 20 meq via ORAL
  Filled 2022-01-29: qty 1

## 2022-01-29 NOTE — Assessment & Plan Note (Signed)
Notably improved EF following CABG.  Has not had any heart failure symptoms to speak of since CABG.  Remains on stable low-dose carvedilol and losartan. Euvolemic.  No diuretic requirement. Tolerated IV fluids, tolvaptan and PO Urea therapy for hyponatremia.

## 2022-01-29 NOTE — Assessment & Plan Note (Signed)
Most recent lipids from June-PCP showed LDL of 53.  Well-controlled on current dose of atorvastatin 40 mg daily.  No change.

## 2022-01-29 NOTE — TOC Transition Note (Signed)
Transition of Care [facility]) - CM/SW Discharge Note   Patient Details  Name: Kelly Olson. MRN: [id number] Date of Birth: 06-16-1950  Transition of Care [facility]) CM/SW Contact:  Dessa Phi, RN Phone Number: 01/29/2022, 1:57 PM   Clinical Narrative:[facility] to deliver home 02 travel tank to rm prior d/c. No further CM needs.       Final next level of care: Home/Self Care Barriers to Discharge: No Barriers Identified   Patient Goals and CMS Choice      Discharge Placement                         Discharge Plan and Services Additional resources added to the After Visit Summary for                  DME Arranged: Oxygen DME Agency: [employer]                  Social Determinants of Health ([place]) Interventions [place]: No Food Insecurity (01/26/2022)  Housing: Low Risk  (01/26/2022)  Transportation Needs: No Transportation Needs (01/26/2022)  Utilities: Not At Risk (01/26/2022)  Tobacco Use: Medium Risk (01/26/2022)     Readmission Risk Interventions     No data to display

## 2022-01-29 NOTE — Assessment & Plan Note (Addendum)
Stable BP on current meds.  No change.  Currently taking 6.25 mg twice daily of carvedilol along with 25 mg daily of losartan.

## 2022-01-29 NOTE — Discharge Summary (Signed)
Physician Discharge Summary  Kelly Olson. WLN:[id number] DOB: Dec 12, 1950 DOA: 01/26/2022  PCP: Wenda Low, MD  Admit date: 01/26/2022 Discharge date: 01/29/2022  Admitted From: Home Disposition:  Home  Recommendations for Outpatient Follow-up:  Follow up with PCP in 1 week Patient d/c'd on supplemental O2, please see if it is appropriate to DC at f/u or consider referral to pulmonology.  Please obtain CMP/CBC in 1 week  Please follow up on the following pending results: A1c, blood cultures  Home Health: No  Equipment/Devices: DME supplemental O2   Discharge Condition: Fair CODE STATUS: Full  Diet recommendation: General  Brief/Interim Summary: From H&P: From Dr. Christia Reading Opyd: "Kelly Olson. is a pleasant 71 y.o. male with medical history significant for CAD, ischemic cardiomyopathy with recovered EF, and recurrent/metastatic small cell lung cancer who presents from the cancer center with cough and hypoxia.   Patient reports that he had been feeling fairly well he developed a cough today while receiving an infusion at the cancer center.  He has had a small amount of sputum production associated with this but has not felt particularly short of breath and denies any chest pain.  He was noted to be saturating in the 80s and was placed on supplemental oxygen.  He remained hypoxic on room air after his infusion and was sent to the ED."  Interim: Patient continued to require supplemental oxygen throughout his stay.  He did overall improve on Solu-Medrol and antibiotics.  Subjective on day of discharge: Patient reports he is breathing much better.  He is eager to go home.  No fevers, chest pain, swelling, nausea/vomiting.  Discharge Diagnoses:  Principal Problem:   Acute respiratory failure with hypoxia ([place])  He is still on 2 L supplemental oxygen nasal cannula and will be discharged with this for home-he followed up with at his PCP's office  He completed 3 days  of azithromycin 500 mg daily.  He will transition to Keflex for 4 more days.  Transition to 2 more days of prednisone 40 mg daily  Continue home breathing treatments Active Problems:   Coronary artery disease involving native coronary artery without angina pectoris  Continue Coreg 6.25 mg twice daily  Losartan 25 mg/d    Primary malignant neoplasm of lung with metastasis to brain [facility])  He has a follow-up appointment with his oncologist this week    COPD exacerbation ([place])  Prednisone as above, breathing treatments as above   Hypokalemia  Resolved, please recheck CMP at follow up     Elevated LFTs  Please monitor CMP at follow up  Likely 2/2 liver mets    Pneumonia of left lower lobe due to infectious organism  Treatment as noted above  Discharge Instructions  Discharge Instructions     Call MD for:  difficulty breathing, headache or visual disturbances   Complete by: As directed    Call MD for:  extreme fatigue   Complete by: As directed    Call MD for:  temperature >100.4   Complete by: As directed    Diet - low sodium heart healthy   Complete by: As directed    Increase activity slowly   Complete by: As directed       Allergies as of 01/29/2022   No Known Allergies       Durable Medical Equipment  (From admission, onward)           Start     Ordered   01/29/22 1154  DME Oxygen  Once       Question Answer Comment  Length of Need 6 Months   Mode or (Route) Nasal cannula   Liters per Minute 2   Frequency Continuous (stationary and portable oxygen unit needed)   Oxygen delivery system Gas      01/29/22 1154            No Known Allergies  Consultations: None   Procedures/Studies: DG Chest Portable 1 View  Result Date: 01/26/2022 CLINICAL DATA:  Hypoxia, lung cancer EXAM: PORTABLE CHEST 1 VIEW COMPARISON:  12/14/2021 FINDINGS: There is progressive consolidation within the left lung base initial may be infectious or inflammatory as can be  seen with post radiation change in the appropriate clinical setting. Small bilateral pleural effusions are present. No pneumothorax. Right internal jugular chest port tip noted within the superior vena cava. Coronary artery bypass grafting has been performed. Cardiac size within normal limits. Pulmonary vascularity is normal. No acute bone abnormality. IMPRESSION: 1. Progressive consolidation within the left lung base, possibly infectious or inflammatory as can be seen with post radiation change in the appropriate clinical setting. Electronically Signed   By: Fidela Salisbury M.D.   On: 01/26/2022 18:57   MR Brain W Wo Contrast  Result Date: 01/19/2022 CLINICAL DATA:  Metastatic disease evaluation 3T SRS Protocol for radiation treatment planning EXAM: MRI HEAD WITHOUT AND WITH CONTRAST TECHNIQUE: Multiplanar, multiecho pulse sequences of the brain and surrounding structures were obtained without and with intravenous contrast. CONTRAST:  43mL GADAVIST GADOBUTROL 1 MMOL/ML IV SOLN COMPARISON:  MRI 01/08/2022. FINDINGS: Brain: Unchanged 4 mm enhancing lesion in the inferomedial left cerebellum, suspicious for a metastasis. No other enhancing lesions identified. No evidence of acute hemorrhage, acute infarct, midline shift, or hydrocephalus. Vascular: Major arterial flow voids are maintained at the skull base. Skull and upper cervical spine: Multiple T1 hypointense lesions in the cervical spine suspicious for osseous metastases. Overall, metastatic osseous disease better characterized on recent PET-CT. Sinuses/Orbits: Negative. IMPRESSION: 1. Unchanged 4 mm enhancing lesion in the inferomedial left cerebellum, suspicious for a metastasis. No other enhancing intracranial lesions identified. 2. Osseous metastatic disease, better characterized on PET-CT. Electronically Signed   By: Margaretha Sheffield M.D.   On: 01/19/2022 08:51   NM PET Image Restage (PS) Skull Base to Thigh (F-18 FDG)  Result Date:  01/13/2022 CLINICAL DATA:  Subsequent treatment strategy for small cell lung cancer. Therapy completed 07/09/2021. EXAM: NUCLEAR MEDICINE PET SKULL BASE TO THIGH TECHNIQUE: 7.37 mCi F-18 FDG was injected intravenously. Full-ring PET imaging was performed from the skull base to thigh after the radiotracer. CT data was obtained and used for attenuation correction and anatomic localization. Fasting blood glucose: 104 mg/dl COMPARISON:  Prior PET-CT 04/06/2021 and recent chest CT 11/04/2021 FINDINGS: Mediastinal blood pool activity: SUV max 1.91 Liver activity: SUV max NA NECK: No hypermetabolic lymph nodes in the neck. Incidental CT findings: Bilateral carotid artery calcifications. CHEST: New and enlarging parenchymal and pleural lesions involving the left hemithorax. 2 cm pleural lesion at the left lung base medially has an SUV max of 9.54. 15 mm pulmonary nodule at the left lung base has an SUV max of 8.81. Small left-sided pleural nodule measuring 8 mm has an SUV max of 5.26. Other smaller hypermetabolic pleural and parenchymal nodules at the left lung base consistent with recurrent disease. No right-sided pleural or parenchymal hypermetabolic disease. No chest wall lesions, supraclavicular or axillary adenopathy. Incidental CT findings: Stable severe underlying emphysematous changes and pulmonary scarring. Stable advanced vascular  disease. ABDOMEN/PELVIS: Small hypermetabolic foci are noted in the liver without definite correlating lesion on the CT scan. Findings are highly suspicious for small hepatic metastatic lesions. Segment 4A lesion has an SUV max of 5.49. Segment 8 lesion has an SUV max of 5.11. Segment 4B lesion has an SUV max of 5.76. No definite adrenal gland metastasis or abdominal/pelvic lymphadenopathy. Incidental CT findings: Stable vascular disease. Stable colonic diverticulosis. SKELETON: Numerous hypermetabolic osseous lesions consistent with osseous metastatic disease. Large right ischial  lesion has an SUV max of 10.10. Smaller left ischial lesion has an SUV max of 5.79. There are also spine and rib lesions but no spinal canal compromise or pathologic fracture. Incidental CT findings: None. IMPRESSION: 1. Hypermetabolic pleural and parenchymal lesions involving the left hemithorax consistent with recurrent neoplasm. No mediastinal or hilar disease. 2. Findings highly suspicious for new hepatic metastatic disease. 3. Widespread osseous metastatic disease. Electronically Signed   By: Marijo Sanes M.D.   On: 01/13/2022 11:25   MR BRAIN W WO CONTRAST  Result Date: 01/08/2022 CLINICAL DATA:  Small cell lung cancer staging. EXAM: MRI HEAD WITHOUT AND WITH CONTRAST TECHNIQUE: Multiplanar, multiecho pulse sequences of the brain and surrounding structures were obtained without and with intravenous contrast. CONTRAST:  4mL GADAVIST GADOBUTROL 1 MMOL/ML IV SOLN COMPARISON:  08/18/2021 FINDINGS: Brain: New 4 mm nodule of enhancement in the inferior left cerebellum. No infarction, hemorrhage, hydrocephalus, extra-axial collection or mass effect. Normal brain volume. Mild white matter disease without progression. Vascular: Normal flow voids and vascular enhancements. Skull and upper cervical spine: Normal marrow signal Sinuses/Orbits: Bilateral mastoid opacification including at the petrous apices, similar to prior. Negative nasopharynx. IMPRESSION: New 4 mm lesion in the inferior left cerebellum, likely solitary metastatic disease. Electronically Signed   By: Jorje Guild M.D.   On: 01/08/2022 12:28     Discharge Exam: Vitals:   01/28/22 2116 01/29/22 0637  BP: (!) 142/87 (!) 179/101  Pulse: 74 86  Resp: 20 18  Temp: 98.4 F (36.9 C) 97.9 F (36.6 C)  SpO2: 92% 95%    General: Pt is alert, awake, not in acute distress Cardiovascular: RRR, S1/S2 +, no edema Respiratory: CTA bilaterally, no wheezing, no rhonchi, no respiratory distress, no conversational dyspnea  Abdominal: Soft, NT, ND,  bowel sounds + Extremities: no edema, no cyanosis Psych: Normal mood and affect, stable judgement and insight     The results of significant diagnostics from this hospitalization (including imaging, microbiology, ancillary and laboratory) are listed below for reference.     Microbiology: Recent Results (from the past 240 hour(s))  Resp panel by RT-PCR (RSV, Flu A&B, Covid) Anterior Nasal Swab     Status: None   Collection Time: 01/26/22  6:30 PM   Specimen: Anterior Nasal Swab  Result Value Ref Range Status   SARS Coronavirus 2 by RT PCR NEGATIVE NEGATIVE Final    Comment: (NOTE) SARS-CoV-2 target nucleic acids are NOT DETECTED.  The SARS-CoV-2 RNA is generally detectable in upper respiratory specimens during the acute phase of infection. The lowest concentration of SARS-CoV-2 viral copies this assay can detect is 138 copies/mL. A negative result does not preclude SARS-Cov-2 infection and should not be used as the sole basis for treatment or other patient management decisions. A negative result may occur with  improper specimen collection/handling, submission of specimen other than nasopharyngeal swab, presence of viral mutation(s) within the areas targeted by this assay, and inadequate number of viral copies(<138 copies/mL). A negative result must be combined  with clinical observations, patient history, and epidemiological information. The expected result is Negative.  Fact Sheet for Patients:  EntrepreneurPulse.com.au  Fact Sheet for Healthcare Providers:  IncredibleEmployment.be  This test is no t yet approved or cleared by the [place] FDA and  has been authorized for detection and/or diagnosis of SARS-CoV-2 by FDA under an Emergency Use Authorization (EUA). This EUA will remain  in effect (meaning this test can be used) for the duration of the COVID-19 declaration under Section 564(b)(1) of the Act, 21 U.S.C.section  360bbb-3(b)(1), unless the authorization is terminated  or revoked sooner.       Influenza A by PCR NEGATIVE NEGATIVE Final   Influenza B by PCR NEGATIVE NEGATIVE Final    Comment: (NOTE) The Xpert Xpress SARS-CoV-2/FLU/RSV plus assay is intended as an aid in the diagnosis of influenza from Nasopharyngeal swab specimens and should not be used as a sole basis for treatment. Nasal washings and aspirates are unacceptable for Xpert Xpress SARS-CoV-2/FLU/RSV testing.  Fact Sheet for Patients: EntrepreneurPulse.com.au  Fact Sheet for Healthcare Providers: IncredibleEmployment.be  This test is not yet approved or cleared by the [place] FDA and has been authorized for detection and/or diagnosis of SARS-CoV-2 by FDA under an Emergency Use Authorization (EUA). This EUA will remain in effect (meaning this test can be used) for the duration of the COVID-19 declaration under Section 564(b)(1) of the Act, 21 U.S.C. section 360bbb-3(b)(1), unless the authorization is terminated or revoked.     Resp Syncytial Virus by PCR NEGATIVE NEGATIVE Final    Comment: (NOTE) Fact Sheet for Patients: EntrepreneurPulse.com.au  Fact Sheet for Healthcare Providers: IncredibleEmployment.be  This test is not yet approved or cleared by the [place] FDA and has been authorized for detection and/or diagnosis of SARS-CoV-2 by FDA under an Emergency Use Authorization (EUA). This EUA will remain in effect (meaning this test can be used) for the duration of the COVID-19 declaration under Section 564(b)(1) of the Act, 21 U.S.C. section 360bbb-3(b)(1), unless the authorization is terminated or revoked.  Performed at [facility], [place] [address], [place], [place] [address]   Blood culture (routine x 2)     Status: None (Preliminary result)   Collection Time: 01/26/22  8:01 PM   Specimen: BLOOD  Result Value  Ref Range Status   Specimen Description   Final    BLOOD BLOOD LEFT FOREARM Performed at [facility] [address], [place], [place] [address]    Special Requests   Final    BOTTLES DRAWN AEROBIC AND ANAEROBIC Blood Culture adequate volume Performed at [facility] [address] [place], [place] [address]    Culture   Final    NO GROWTH 2 DAYS Performed at [facility] [address], [place], [place] [address]    Report Status PENDING  Incomplete  Blood culture (routine x 2)     Status: None (Preliminary result)   Collection Time: 01/26/22  8:05 PM   Specimen: BLOOD  Result Value Ref Range Status   Specimen Description   Final    BLOOD RIGHT PORTA CATH Performed at [facility] [address] [place], [place] [address]    Special Requests   Final    BOTTLES DRAWN AEROBIC AND ANAEROBIC Blood Culture adequate volume Performed at [facility] [address] [place], [place] [address]    Culture   Final    NO GROWTH 2 DAYS Performed at [facility], [place] [address], [place], [place] [address]    Report Status PENDING  Incomplete     Labs: BNP (last 3 results) Recent Labs    01/26/22 1830  BNP 470.9*   Basic Metabolic Panel: Recent Labs  Lab 01/26/22 1831 01/27/22 0347 01/28/22 0330 01/28/22 1546 01/29/22 0214  NA 141 140 142 140 143  K 2.1* 2.7* 2.6* 3.6 3.2*  CL 95* 97* 94* 95* 98  CO2 37* 37* 40* 34* 36*  GLUCOSE 119* 227* 152* 195* 189*  BUN 39* 35* 25* 30* 32*  CREATININE 0.77 0.88 0.66 0.78 0.69  CALCIUM 8.1* 7.9* 8.1* 8.1* 8.0*  MG  --  2.2 2.0  --   --    Liver Function Tests: Recent Labs  Lab 01/25/22 0758 01/26/22 1831 01/27/22 0347 01/28/22 0330 01/29/22 0214  AST 176* 183* 151* 114* 105*  ALT 207* 214* 198* 189* 182*  ALKPHOS 190* 201* 180* 206* 223*  BILITOT 1.3* 1.1 0.9 0.9 1.0  PROT 6.0* 5.7* 5.5* 5.5* 5.3*  ALBUMIN 3.6 3.1* 2.8* 2.7* 2.8*    CBC: Recent Labs  Lab 01/25/22 0758 01/26/22 1831 01/27/22 0347 01/28/22 0330 01/29/22 0214  WBC 12.2* 11.7* 10.7* 11.1* 10.0  NEUTROABS 10.7* 10.7*  --   --   --   HGB 14.1 12.2* 11.6* 12.2* 11.6*  HCT 41.0 37.0* 35.0* 37.2* 36.1*  MCV 96.2 100.0 100.6* 100.5* 101.1*  PLT 103* 100* 85* 91* 80*   Urinalysis    Component Value Date/Time   COLORURINE YELLOW 07/24/2018 1102   APPEARANCEUR CLEAR 07/24/2018 1102   LABSPEC 1.019 07/24/2018 1102   PHURINE 5.0 07/24/2018 1102   GLUCOSEU NEGATIVE 07/24/2018 1102   HGBUR SMALL (A) 07/24/2018 1102   BILIRUBINUR NEGATIVE 07/24/2018 1102   KETONESUR NEGATIVE 07/24/2018 1102   PROTEINUR NEGATIVE 07/24/2018 1102   NITRITE NEGATIVE 07/24/2018 1102   LEUKOCYTESUR NEGATIVE 07/24/2018 1102   Sepsis Labs Recent Labs  Lab 01/26/22 1831 01/27/22 0347 01/28/22 0330 01/29/22 0214  WBC 11.7* 10.7* 11.1* 10.0   Microbiology Recent Results (from the past 240 hour(s))  Resp panel by RT-PCR (RSV, Flu A&B, Covid) Anterior Nasal Swab     Status: None   Collection Time: 01/26/22  6:30 PM   Specimen: Anterior Nasal Swab  Result Value Ref Range Status   SARS Coronavirus 2 by RT PCR NEGATIVE NEGATIVE Final    Comment: (NOTE) SARS-CoV-2 target nucleic acids are NOT DETECTED.  The SARS-CoV-2 RNA is generally detectable in upper respiratory specimens during the acute phase of infection. The lowest concentration of SARS-CoV-2 viral copies this assay can detect is 138 copies/mL. A negative result does not preclude SARS-Cov-2 infection and should not be used as the sole basis for treatment or other patient management decisions. A negative result may occur with  improper specimen collection/handling, submission of specimen other than nasopharyngeal swab, presence of viral mutation(s) within the areas targeted by this assay, and inadequate number of viral copies(<138 copies/mL). A negative result must be combined with clinical observations,  patient history, and epidemiological information. The expected result is Negative.  Fact Sheet for Patients:  EntrepreneurPulse.com.au  Fact Sheet for Healthcare Providers:  IncredibleEmployment.be  This test is no t yet approved or cleared by the [place] FDA and  has been authorized for detection and/or diagnosis of SARS-CoV-2 by FDA under an Emergency Use Authorization (EUA). This EUA will remain  in effect (meaning this test can be used) for the duration of the COVID-19 declaration  under Section 564(b)(1) of the Act, 21 U.S.C.section 360bbb-3(b)(1), unless the authorization is terminated  or revoked sooner.       Influenza A by PCR NEGATIVE NEGATIVE Final   Influenza B by PCR NEGATIVE NEGATIVE Final    Comment: (NOTE) The Xpert Xpress SARS-CoV-2/FLU/RSV plus assay is intended as an aid in the diagnosis of influenza from Nasopharyngeal swab specimens and should not be used as a sole basis for treatment. Nasal washings and aspirates are unacceptable for Xpert Xpress SARS-CoV-2/FLU/RSV testing.  Fact Sheet for Patients: EntrepreneurPulse.com.au  Fact Sheet for Healthcare Providers: IncredibleEmployment.be  This test is not yet approved or cleared by the [place] FDA and has been authorized for detection and/or diagnosis of SARS-CoV-2 by FDA under an Emergency Use Authorization (EUA). This EUA will remain in effect (meaning this test can be used) for the duration of the COVID-19 declaration under Section 564(b)(1) of the Act, 21 U.S.C. section 360bbb-3(b)(1), unless the authorization is terminated or revoked.     Resp Syncytial Virus by PCR NEGATIVE NEGATIVE Final    Comment: (NOTE) Fact Sheet for Patients: EntrepreneurPulse.com.au  Fact Sheet for Healthcare Providers: IncredibleEmployment.be  This test is not yet approved or cleared by the [place] FDA and has been authorized for detection and/or diagnosis of SARS-CoV-2 by FDA under an Emergency Use Authorization (EUA). This EUA will remain in effect (meaning this test can be used) for the duration of the COVID-19 declaration under Section 564(b)(1) of the Act, 21 U.S.C. section 360bbb-3(b)(1), unless the authorization is terminated or revoked.  Performed at [facility], [place] [address] [place], [place] [address]   Blood culture (routine x 2)     Status: None (Preliminary result)   Collection Time: 01/26/22  8:01 PM   Specimen: BLOOD  Result Value Ref Range Status   Specimen Description   Final    BLOOD BLOOD LEFT FOREARM Performed at [facility] [address] [place], [place] [address]    Special Requests   Final    BOTTLES DRAWN AEROBIC AND ANAEROBIC Blood Culture adequate volume Performed at [facility] [address], [place], [place] [address]    Culture   Final    NO GROWTH 2 DAYS Performed at [facility] [address], [place], [place] [address]    Report Status PENDING  Incomplete  Blood culture (routine x 2)     Status: None (Preliminary result)   Collection Time: 01/26/22  8:05 PM   Specimen: BLOOD  Result Value Ref Range Status   Specimen Description   Final    BLOOD RIGHT PORTA CATH Performed at [facility] [address] [place], [place] [address]    Special Requests   Final    BOTTLES DRAWN AEROBIC AND ANAEROBIC Blood Culture adequate volume Performed at [facility] [address] [place], [place] [address]    Culture   Final    NO GROWTH 2 DAYS Performed at [facility] [address], [place], [place] [address]    Report Status PENDING  Incomplete     Patient was seen and examined on the day of discharge and was found to be in stable condition. Time coordinating discharge: 31 minutes including assessment and  coordination of care, as well as examination of the patient.   SIGNED:  Shelda Pal, DO [facility] 01/29/2022, 1:55 PM

## 2022-01-29 NOTE — Assessment & Plan Note (Signed)
Had previously been very active until his recent diagnosis of recurrent lung cancer. 7-1/2 years out from CABG with no recurrent angina or heart failure symptoms. Nonischemic Myoview was in 2020.  Should be due for follow-up Myoview as early as next June, but can wait until his cancer treatments are completed.  Plan: Continue current low-dose of carvedilol and losartan. Continue 81 mg aspirin maintenance along with 40 mg atorvastatin. Aspirin can be held 5 to 7 days preop for surgery or procedures.

## 2022-01-29 NOTE — Assessment & Plan Note (Signed)
Follow-up sodium on December 4 was 139.  Potassium was stable at 4.5.

## 2022-01-29 NOTE — Progress Notes (Signed)
SATURATION QUALIFICATIONS: (This note is used to comply with regulatory documentation for home oxygen)  Patient Saturations on Room Air at Rest = 82%  Patient Saturations on Room Air while Ambulating = 85%  Patient Saturations on 2 Liters of oxygen while Ambulating = 91%  Please briefly explain why patient needs home oxygen:  Patient ambulated a distance of 537ft, denies feeling short of breath or dizziness. Patient returned to room on 2L maintaining O2 sats greater than 90%.

## 2022-01-29 NOTE — TOC Transition Note (Signed)
Transition of Care [facility]) - CM/SW Discharge Note   Patient Details  Name: Kelly Olson. MRN: [id number] Date of Birth: [birth year]  [facility] CM/SW Contact:  Dessa Phi, RN Phone Number: 01/29/2022, 4:07 PM   Clinical Narrative: Unable to reach [employer] for dme delivery of home 02-TC [employer] rep Jermaine to deliver to rm home 02 travel tank.      Final next level of care: Home/Self Care Barriers to Discharge: No Barriers Identified   Patient Goals and CMS Choice      Discharge Placement                         Discharge Plan and Services Additional resources added to the After Visit Summary for                  DME Arranged: Oxygen DME Agency: [facility] Date DME Agency Contacted: 01/29/22 Time DME Agency Contacted: [phone] Representative spoke with at DME Agency: [place] Determinants of Health ([place]) Interventions [place]: No Food Insecurity (01/26/2022)  Housing: Low Risk  (01/26/2022)  Transportation Needs: No Transportation Needs (01/26/2022)  Utilities: Not At Risk (01/26/2022)  Tobacco Use: Medium Risk (01/26/2022)     Readmission Risk Interventions     No data to display

## 2022-01-29 NOTE — Discharge Instructions (Signed)

## 2022-01-31 ENCOUNTER — Inpatient Hospital Stay: Payer: Medicare Other

## 2022-01-31 DIAGNOSIS — C3432 Malignant neoplasm of lower lobe, left bronchus or lung: Secondary | ICD-10-CM | POA: Insufficient documentation

## 2022-01-31 DIAGNOSIS — Z79899 Other long term (current) drug therapy: Secondary | ICD-10-CM | POA: Insufficient documentation

## 2022-01-31 DIAGNOSIS — Z5112 Encounter for antineoplastic immunotherapy: Secondary | ICD-10-CM | POA: Insufficient documentation

## 2022-01-31 DIAGNOSIS — Z5189 Encounter for other specified aftercare: Secondary | ICD-10-CM | POA: Insufficient documentation

## 2022-01-31 DIAGNOSIS — C3492 Malignant neoplasm of unspecified part of left bronchus or lung: Secondary | ICD-10-CM

## 2022-01-31 DIAGNOSIS — Z5111 Encounter for antineoplastic chemotherapy: Secondary | ICD-10-CM | POA: Insufficient documentation

## 2022-01-31 DIAGNOSIS — C7951 Secondary malignant neoplasm of bone: Secondary | ICD-10-CM | POA: Insufficient documentation

## 2022-01-31 DIAGNOSIS — C7931 Secondary malignant neoplasm of brain: Secondary | ICD-10-CM | POA: Insufficient documentation

## 2022-01-31 DIAGNOSIS — Z95828 Presence of other vascular implants and grafts: Secondary | ICD-10-CM

## 2022-01-31 LAB — CBC WITH DIFFERENTIAL (CANCER CENTER ONLY)
Abs Immature Granulocytes: 0.16 10*3/uL — ABNORMAL HIGH (ref 0.00–0.07)
Basophils Absolute: 0 10*3/uL (ref 0.0–0.1)
Basophils Relative: 0 %
Eosinophils Absolute: 0 10*3/uL (ref 0.0–0.5)
Eosinophils Relative: 0 %
HCT: 36.5 % — ABNORMAL LOW (ref 39.0–52.0)
Hemoglobin: 12.1 g/dL — ABNORMAL LOW (ref 13.0–17.0)
Immature Granulocytes: 2 %
Lymphocytes Relative: 3 %
Lymphs Abs: 0.2 10*3/uL — ABNORMAL LOW (ref 0.7–4.0)
MCH: 33.2 pg (ref 26.0–34.0)
MCHC: 33.2 g/dL (ref 30.0–36.0)
MCV: 100 fL (ref 80.0–100.0)
Monocytes Absolute: 0.1 10*3/uL (ref 0.1–1.0)
Monocytes Relative: 1 %
Neutro Abs: 6.4 10*3/uL (ref 1.7–7.7)
Neutrophils Relative %: 94 %
Platelet Count: 53 10*3/uL — ABNORMAL LOW (ref 150–400)
RBC: 3.65 MIL/uL — ABNORMAL LOW (ref 4.22–5.81)
RDW: 14 % (ref 11.5–15.5)
WBC Count: 6.9 10*3/uL (ref 4.0–10.5)
nRBC: 0 % (ref 0.0–0.2)

## 2022-01-31 LAB — CMP (CANCER CENTER ONLY)
ALT: 215 U/L — ABNORMAL HIGH (ref 0–44)
AST: 86 U/L — ABNORMAL HIGH (ref 15–41)
Albumin: 3.5 g/dL (ref 3.5–5.0)
Alkaline Phosphatase: 233 U/L — ABNORMAL HIGH (ref 38–126)
Anion gap: 5 (ref 5–15)
BUN: 39 mg/dL — ABNORMAL HIGH (ref 8–23)
CO2: 37 mmol/L — ABNORMAL HIGH (ref 22–32)
Calcium: 8.8 mg/dL — ABNORMAL LOW (ref 8.9–10.3)
Chloride: 101 mmol/L (ref 98–111)
Creatinine: 0.62 mg/dL (ref 0.61–1.24)
GFR, Estimated: >60 mL/min (ref >60)
Glucose, Bld: 132 mg/dL — ABNORMAL HIGH (ref 70–99)
Potassium: 3.5 mmol/L (ref 3.5–5.1)
Sodium: 143 mmol/L (ref 135–145)
Total Bilirubin: 1 mg/dL (ref 0.3–1.2)
Total Protein: 5.9 g/dL — ABNORMAL LOW (ref 6.5–8.1)

## 2022-01-31 LAB — HEMOGLOBIN A1C
Hgb A1c MFr Bld: 6.6 % — ABNORMAL HIGH (ref 4.8–5.6)
Mean Plasma Glucose: 143 mg/dL

## 2022-01-31 MED ORDER — SODIUM CHLORIDE 0.9% FLUSH
10.0000 mL | Freq: Once | INTRAVENOUS | Status: AC
  Administered 2022-01-31: 10 mL

## 2022-01-31 MED ORDER — HEPARIN SOD (PORK) LOCK FLUSH 100 UNIT/ML IV SOLN
500.0000 [IU] | Freq: Once | INTRAVENOUS | Status: AC
  Administered 2022-01-31: 500 [IU]

## 2022-01-31 NOTE — Telephone Encounter (Signed)
So sorry to hear about the recent hospitalization.  Sorry about the recurrence of cancer.  Not sure what to make of the tingling in the fingers and toes.  It sounds like his neuropathy.  Oxygen levels seem to be doing okay.  I would recommend checking in with the oncologist.  Glenetta Hew, MD

## 2022-02-01 ENCOUNTER — Other Ambulatory Visit: Payer: Self-pay

## 2022-02-01 ENCOUNTER — Ambulatory Visit
Admission: RE | Admit: 2022-02-01 | Discharge: 2022-02-01 | Disposition: A | Discharge status: Home / Self Care | Payer: Medicare Other | Source: Ambulatory Visit | Attending: Radiation Oncology | Admitting: Radiation Oncology

## 2022-02-01 DIAGNOSIS — C7951 Secondary malignant neoplasm of bone: Secondary | ICD-10-CM | POA: Diagnosis not present

## 2022-02-01 DIAGNOSIS — C3432 Malignant neoplasm of lower lobe, left bronchus or lung: Secondary | ICD-10-CM | POA: Diagnosis not present

## 2022-02-01 DIAGNOSIS — Z87891 Personal history of nicotine dependence: Secondary | ICD-10-CM | POA: Diagnosis not present

## 2022-02-01 DIAGNOSIS — Z510 Encounter for antineoplastic radiation therapy: Secondary | ICD-10-CM | POA: Diagnosis not present

## 2022-02-01 DIAGNOSIS — C7931 Secondary malignant neoplasm of brain: Secondary | ICD-10-CM | POA: Diagnosis not present

## 2022-02-01 LAB — RAD ONC ARIA SESSION SUMMARY
Course Elapsed Days: 5
Plan Fractions Treated to Date: 1
Plan Fractions Treated to Date: 1
Plan Prescribed Dose Per Fraction: 3 Gy
Plan Prescribed Dose Per Fraction: 3 Gy
Plan Total Fractions Prescribed: 10
Plan Total Fractions Prescribed: 10
Plan Total Prescribed Dose: 30 Gy
Plan Total Prescribed Dose: 30 Gy
Reference Point Dosage Given to Date: 3 Gy
Reference Point Dosage Given to Date: 3 Gy
Reference Point Session Dosage Given: 3 Gy
Reference Point Session Dosage Given: 3 Gy
Session Number: 2

## 2022-02-01 LAB — CULTURE, BLOOD (ROUTINE X 2)
Culture: NO GROWTH
Culture: NO GROWTH
Special Requests: ADEQUATE
Special Requests: ADEQUATE

## 2022-02-01 LAB — LEGIONELLA PNEUMOPHILA SEROGP 1 UR AG: L. pneumophila Serogp 1 Ur Ag: NEGATIVE

## 2022-02-02 ENCOUNTER — Other Ambulatory Visit: Payer: Self-pay

## 2022-02-02 ENCOUNTER — Ambulatory Visit
Admission: RE | Admit: 2022-02-02 | Discharge: 2022-02-02 | Disposition: A | Discharge status: Home / Self Care | Payer: Medicare Other | Source: Ambulatory Visit | Attending: Radiation Oncology | Admitting: Radiation Oncology

## 2022-02-02 DIAGNOSIS — Z87891 Personal history of nicotine dependence: Secondary | ICD-10-CM | POA: Diagnosis not present

## 2022-02-02 DIAGNOSIS — J9611 Chronic respiratory failure with hypoxia: Secondary | ICD-10-CM | POA: Diagnosis not present

## 2022-02-02 DIAGNOSIS — G4700 Insomnia, unspecified: Secondary | ICD-10-CM | POA: Diagnosis not present

## 2022-02-02 DIAGNOSIS — E46 Unspecified protein-calorie malnutrition: Secondary | ICD-10-CM | POA: Diagnosis not present

## 2022-02-02 DIAGNOSIS — C7931 Secondary malignant neoplasm of brain: Secondary | ICD-10-CM | POA: Diagnosis not present

## 2022-02-02 DIAGNOSIS — C3490 Malignant neoplasm of unspecified part of unspecified bronchus or lung: Secondary | ICD-10-CM | POA: Diagnosis not present

## 2022-02-02 DIAGNOSIS — C7951 Secondary malignant neoplasm of bone: Secondary | ICD-10-CM | POA: Diagnosis not present

## 2022-02-02 DIAGNOSIS — C3432 Malignant neoplasm of lower lobe, left bronchus or lung: Secondary | ICD-10-CM | POA: Diagnosis not present

## 2022-02-02 DIAGNOSIS — K209 Esophagitis, unspecified without bleeding: Secondary | ICD-10-CM | POA: Diagnosis not present

## 2022-02-02 DIAGNOSIS — J189 Pneumonia, unspecified organism: Secondary | ICD-10-CM | POA: Diagnosis not present

## 2022-02-02 DIAGNOSIS — Z510 Encounter for antineoplastic radiation therapy: Secondary | ICD-10-CM | POA: Diagnosis not present

## 2022-02-02 DIAGNOSIS — J449 Chronic obstructive pulmonary disease, unspecified: Secondary | ICD-10-CM | POA: Diagnosis not present

## 2022-02-02 LAB — RAD ONC ARIA SESSION SUMMARY
Course Elapsed Days: 6
Plan Fractions Treated to Date: 2
Plan Fractions Treated to Date: 2
Plan Prescribed Dose Per Fraction: 3 Gy
Plan Prescribed Dose Per Fraction: 3 Gy
Plan Total Fractions Prescribed: 10
Plan Total Fractions Prescribed: 10
Plan Total Prescribed Dose: 30 Gy
Plan Total Prescribed Dose: 30 Gy
Reference Point Dosage Given to Date: 6 Gy
Reference Point Dosage Given to Date: 6 Gy
Reference Point Session Dosage Given: 3 Gy
Reference Point Session Dosage Given: 3 Gy
Session Number: 3

## 2022-02-03 ENCOUNTER — Other Ambulatory Visit: Payer: Self-pay

## 2022-02-03 ENCOUNTER — Ambulatory Visit
Admission: RE | Admit: 2022-02-03 | Discharge: 2022-02-03 | Disposition: A | Discharge status: Home / Self Care | Payer: Medicare Other | Source: Ambulatory Visit | Attending: Radiation Oncology | Admitting: Radiation Oncology

## 2022-02-03 DIAGNOSIS — C3432 Malignant neoplasm of lower lobe, left bronchus or lung: Secondary | ICD-10-CM | POA: Diagnosis not present

## 2022-02-03 DIAGNOSIS — Z87891 Personal history of nicotine dependence: Secondary | ICD-10-CM | POA: Diagnosis not present

## 2022-02-03 DIAGNOSIS — Z510 Encounter for antineoplastic radiation therapy: Secondary | ICD-10-CM | POA: Diagnosis not present

## 2022-02-03 DIAGNOSIS — C7951 Secondary malignant neoplasm of bone: Secondary | ICD-10-CM | POA: Diagnosis not present

## 2022-02-03 DIAGNOSIS — C7931 Secondary malignant neoplasm of brain: Secondary | ICD-10-CM | POA: Diagnosis not present

## 2022-02-03 LAB — RAD ONC ARIA SESSION SUMMARY
Course Elapsed Days: 7
Plan Fractions Treated to Date: 3
Plan Fractions Treated to Date: 3
Plan Prescribed Dose Per Fraction: 3 Gy
Plan Prescribed Dose Per Fraction: 3 Gy
Plan Total Fractions Prescribed: 10
Plan Total Fractions Prescribed: 10
Plan Total Prescribed Dose: 30 Gy
Plan Total Prescribed Dose: 30 Gy
Reference Point Dosage Given to Date: 9 Gy
Reference Point Dosage Given to Date: 9 Gy
Reference Point Session Dosage Given: 3 Gy
Reference Point Session Dosage Given: 3 Gy
Session Number: 4

## 2022-02-06 ENCOUNTER — Other Ambulatory Visit: Payer: Self-pay

## 2022-02-06 ENCOUNTER — Telehealth: Payer: Self-pay | Admitting: Internal Medicine

## 2022-02-06 ENCOUNTER — Ambulatory Visit
Admission: RE | Admit: 2022-02-06 | Discharge: 2022-02-06 | Disposition: A | Discharge status: Home / Self Care | Payer: Medicare Other | Source: Ambulatory Visit | Attending: Radiation Oncology | Admitting: Radiation Oncology

## 2022-02-06 ENCOUNTER — Other Ambulatory Visit: Payer: Medicare Other

## 2022-02-06 ENCOUNTER — Other Ambulatory Visit (HOSPITAL_COMMUNITY): Payer: Medicare Other

## 2022-02-06 ENCOUNTER — Ambulatory Visit: Payer: Medicare Other

## 2022-02-06 DIAGNOSIS — Z510 Encounter for antineoplastic radiation therapy: Secondary | ICD-10-CM | POA: Diagnosis not present

## 2022-02-06 DIAGNOSIS — Z87891 Personal history of nicotine dependence: Secondary | ICD-10-CM | POA: Diagnosis not present

## 2022-02-06 DIAGNOSIS — C3432 Malignant neoplasm of lower lobe, left bronchus or lung: Secondary | ICD-10-CM | POA: Diagnosis not present

## 2022-02-06 DIAGNOSIS — C7951 Secondary malignant neoplasm of bone: Secondary | ICD-10-CM | POA: Diagnosis not present

## 2022-02-06 DIAGNOSIS — C7931 Secondary malignant neoplasm of brain: Secondary | ICD-10-CM | POA: Diagnosis not present

## 2022-02-06 LAB — RAD ONC ARIA SESSION SUMMARY
Course Elapsed Days: 10
Plan Fractions Treated to Date: 4
Plan Fractions Treated to Date: 4
Plan Prescribed Dose Per Fraction: 3 Gy
Plan Prescribed Dose Per Fraction: 3 Gy
Plan Total Fractions Prescribed: 10
Plan Total Fractions Prescribed: 10
Plan Total Prescribed Dose: 30 Gy
Plan Total Prescribed Dose: 30 Gy
Reference Point Dosage Given to Date: 12 Gy
Reference Point Dosage Given to Date: 12 Gy
Reference Point Session Dosage Given: 3 Gy
Reference Point Session Dosage Given: 3 Gy
Session Number: 5

## 2022-02-06 NOTE — Telephone Encounter (Signed)
Called patient regarding upcoming January appointment, patient is notified.

## 2022-02-07 ENCOUNTER — Other Ambulatory Visit: Payer: Self-pay

## 2022-02-07 ENCOUNTER — Other Ambulatory Visit: Payer: Self-pay | Admitting: Internal Medicine

## 2022-02-07 ENCOUNTER — Ambulatory Visit (HOSPITAL_COMMUNITY)
Admission: RE | Admit: 2022-02-07 | Discharge: 2022-02-07 | Disposition: A | Discharge status: Home / Self Care | Payer: Medicare Other | Source: Ambulatory Visit | Attending: Internal Medicine | Admitting: Internal Medicine

## 2022-02-07 ENCOUNTER — Inpatient Hospital Stay: Payer: Medicare Other

## 2022-02-07 ENCOUNTER — Telehealth: Payer: Self-pay

## 2022-02-07 ENCOUNTER — Ambulatory Visit
Admission: RE | Admit: 2022-02-07 | Discharge: 2022-02-07 | Disposition: A | Discharge status: Home / Self Care | Payer: Medicare Other | Source: Ambulatory Visit | Attending: Radiation Oncology | Admitting: Radiation Oncology

## 2022-02-07 DIAGNOSIS — Z510 Encounter for antineoplastic radiation therapy: Secondary | ICD-10-CM | POA: Diagnosis not present

## 2022-02-07 DIAGNOSIS — C3490 Malignant neoplasm of unspecified part of unspecified bronchus or lung: Secondary | ICD-10-CM

## 2022-02-07 DIAGNOSIS — J90 Pleural effusion, not elsewhere classified: Secondary | ICD-10-CM | POA: Diagnosis not present

## 2022-02-07 DIAGNOSIS — E876 Hypokalemia: Secondary | ICD-10-CM

## 2022-02-07 DIAGNOSIS — R918 Other nonspecific abnormal finding of lung field: Secondary | ICD-10-CM | POA: Diagnosis not present

## 2022-02-07 DIAGNOSIS — C3432 Malignant neoplasm of lower lobe, left bronchus or lung: Secondary | ICD-10-CM | POA: Diagnosis not present

## 2022-02-07 DIAGNOSIS — Z87891 Personal history of nicotine dependence: Secondary | ICD-10-CM | POA: Diagnosis not present

## 2022-02-07 DIAGNOSIS — C7951 Secondary malignant neoplasm of bone: Secondary | ICD-10-CM | POA: Diagnosis not present

## 2022-02-07 DIAGNOSIS — J439 Emphysema, unspecified: Secondary | ICD-10-CM | POA: Diagnosis not present

## 2022-02-07 LAB — RAD ONC ARIA SESSION SUMMARY
Course Elapsed Days: 11
Plan Fractions Treated to Date: 5
Plan Fractions Treated to Date: 5
Plan Prescribed Dose Per Fraction: 3 Gy
Plan Prescribed Dose Per Fraction: 3 Gy
Plan Total Fractions Prescribed: 10
Plan Total Fractions Prescribed: 10
Plan Total Prescribed Dose: 30 Gy
Plan Total Prescribed Dose: 30 Gy
Reference Point Dosage Given to Date: 15 Gy
Reference Point Dosage Given to Date: 15 Gy
Reference Point Session Dosage Given: 3 Gy
Reference Point Session Dosage Given: 3 Gy
Session Number: 6

## 2022-02-07 LAB — CBC WITH DIFFERENTIAL (CANCER CENTER ONLY)
Abs Immature Granulocytes: 0.03 10*3/uL (ref 0.00–0.07)
Basophils Absolute: 0 10*3/uL (ref 0.0–0.1)
Basophils Relative: 1 %
Eosinophils Absolute: 0 10*3/uL (ref 0.0–0.5)
Eosinophils Relative: 0 %
HCT: 30.3 % — ABNORMAL LOW (ref 39.0–52.0)
Hemoglobin: 10.6 g/dL — ABNORMAL LOW (ref 13.0–17.0)
Immature Granulocytes: 2 %
Lymphocytes Relative: 5 %
Lymphs Abs: 0.1 10*3/uL — ABNORMAL LOW (ref 0.7–4.0)
MCH: 34.4 pg — ABNORMAL HIGH (ref 26.0–34.0)
MCHC: 35 g/dL (ref 30.0–36.0)
MCV: 98.4 fL (ref 80.0–100.0)
Monocytes Absolute: 0.3 10*3/uL (ref 0.1–1.0)
Monocytes Relative: 15 %
Neutro Abs: 1.4 10*3/uL — ABNORMAL LOW (ref 1.7–7.7)
Neutrophils Relative %: 77 %
Platelet Count: 42 10*3/uL — ABNORMAL LOW (ref 150–400)
RBC: 3.08 MIL/uL — ABNORMAL LOW (ref 4.22–5.81)
RDW: 13.4 % (ref 11.5–15.5)
WBC Count: 1.8 10*3/uL — ABNORMAL LOW (ref 4.0–10.5)
nRBC: 3.8 % — ABNORMAL HIGH (ref 0.0–0.2)

## 2022-02-07 LAB — CMP (CANCER CENTER ONLY)
ALT: 102 U/L — ABNORMAL HIGH (ref 0–44)
AST: 39 U/L (ref 15–41)
Albumin: 3.3 g/dL — ABNORMAL LOW (ref 3.5–5.0)
Alkaline Phosphatase: 222 U/L — ABNORMAL HIGH (ref 38–126)
Anion gap: 5 (ref 5–15)
BUN: 44 mg/dL — ABNORMAL HIGH (ref 8–23)
CO2: 40 mmol/L — ABNORMAL HIGH (ref 22–32)
Calcium: 9 mg/dL (ref 8.9–10.3)
Chloride: 97 mmol/L — ABNORMAL LOW (ref 98–111)
Creatinine: 0.63 mg/dL (ref 0.61–1.24)
GFR, Estimated: >60 mL/min (ref >60)
Glucose, Bld: 185 mg/dL — ABNORMAL HIGH (ref 70–99)
Potassium: 2.7 mmol/L — CL (ref 3.5–5.1)
Sodium: 142 mmol/L (ref 135–145)
Total Bilirubin: 0.9 mg/dL (ref 0.3–1.2)
Total Protein: 5.6 g/dL — ABNORMAL LOW (ref 6.5–8.1)

## 2022-02-07 MED ORDER — HEPARIN SOD (PORK) LOCK FLUSH 100 UNIT/ML IV SOLN
INTRAVENOUS | Status: AC
  Administered 2022-02-07: 500 [IU]
  Filled 2022-02-07: qty 5

## 2022-02-07 MED ORDER — POTASSIUM CHLORIDE CRYS ER 20 MEQ PO TBCR: 20.0000 meq | EXTENDED_RELEASE_TABLET | Freq: Two times a day (BID) | ORAL | 0 refills | Status: AC

## 2022-02-07 MED ORDER — SODIUM CHLORIDE (PF) 0.9 % IJ SOLN
INTRAMUSCULAR | Status: AC
  Filled 2022-02-07: qty 50

## 2022-02-07 MED ORDER — IOHEXOL 300 MG/ML  SOLN
75.0000 mL | Freq: Once | INTRAMUSCULAR | Status: AC | PRN
  Administered 2022-02-07: 75 mL via INTRAVENOUS

## 2022-02-07 NOTE — Telephone Encounter (Signed)
CRITICAL VALUE STICKER  CRITICAL VALUE: K = 2.7  RECEIVER (on-site recipient of call): Arna Snipe, RN  [place] NOTIFIED: 02/07/2022 @ 12:39 PM  MESSENGER (representative from lab): Lauren  MD NOTIFIED:  Dr. Julien Nordmann  TIME OF NOTIFICATION:02/07/22 @ 12:41  RESPONSE:  Dr. Julien Nordmann sent in prescription to patient's pharmacist for potassium tablets. This RN called patient to let him know. Patient voiced understanding.

## 2022-02-08 ENCOUNTER — Other Ambulatory Visit: Payer: Self-pay

## 2022-02-08 ENCOUNTER — Ambulatory Visit
Admission: RE | Admit: 2022-02-08 | Discharge: 2022-02-08 | Disposition: A | Discharge status: Home / Self Care | Payer: Medicare Other | Source: Ambulatory Visit | Attending: Radiation Oncology | Admitting: Radiation Oncology

## 2022-02-08 ENCOUNTER — Telehealth: Payer: Self-pay | Admitting: Internal Medicine

## 2022-02-08 ENCOUNTER — Ambulatory Visit: Payer: Medicare Other | Admitting: Internal Medicine

## 2022-02-08 DIAGNOSIS — C7951 Secondary malignant neoplasm of bone: Secondary | ICD-10-CM | POA: Diagnosis not present

## 2022-02-08 DIAGNOSIS — C3432 Malignant neoplasm of lower lobe, left bronchus or lung: Secondary | ICD-10-CM | POA: Diagnosis not present

## 2022-02-08 DIAGNOSIS — Z510 Encounter for antineoplastic radiation therapy: Secondary | ICD-10-CM | POA: Diagnosis not present

## 2022-02-08 DIAGNOSIS — C7931 Secondary malignant neoplasm of brain: Secondary | ICD-10-CM | POA: Diagnosis not present

## 2022-02-08 DIAGNOSIS — Z87891 Personal history of nicotine dependence: Secondary | ICD-10-CM | POA: Diagnosis not present

## 2022-02-08 LAB — RAD ONC ARIA SESSION SUMMARY
Course Elapsed Days: 12
Plan Fractions Treated to Date: 6
Plan Fractions Treated to Date: 6
Plan Prescribed Dose Per Fraction: 3 Gy
Plan Prescribed Dose Per Fraction: 3 Gy
Plan Total Fractions Prescribed: 10
Plan Total Fractions Prescribed: 10
Plan Total Prescribed Dose: 30 Gy
Plan Total Prescribed Dose: 30 Gy
Reference Point Dosage Given to Date: 18 Gy
Reference Point Dosage Given to Date: 18 Gy
Reference Point Session Dosage Given: 3 Gy
Reference Point Session Dosage Given: 3 Gy
Session Number: 7

## 2022-02-08 NOTE — Telephone Encounter (Signed)
Called patient regarding upcoming January and February appointments. Left a voicemail.

## 2022-02-09 ENCOUNTER — Other Ambulatory Visit: Payer: Self-pay

## 2022-02-09 ENCOUNTER — Ambulatory Visit
Admission: RE | Admit: 2022-02-09 | Discharge: 2022-02-09 | Disposition: A | Discharge status: Home / Self Care | Payer: Medicare Other | Source: Ambulatory Visit | Attending: Radiation Oncology | Admitting: Radiation Oncology

## 2022-02-09 DIAGNOSIS — C7951 Secondary malignant neoplasm of bone: Secondary | ICD-10-CM | POA: Diagnosis not present

## 2022-02-09 DIAGNOSIS — Z510 Encounter for antineoplastic radiation therapy: Secondary | ICD-10-CM | POA: Diagnosis not present

## 2022-02-09 DIAGNOSIS — Z87891 Personal history of nicotine dependence: Secondary | ICD-10-CM | POA: Diagnosis not present

## 2022-02-09 DIAGNOSIS — C3432 Malignant neoplasm of lower lobe, left bronchus or lung: Secondary | ICD-10-CM | POA: Diagnosis not present

## 2022-02-09 DIAGNOSIS — C7931 Secondary malignant neoplasm of brain: Secondary | ICD-10-CM | POA: Diagnosis not present

## 2022-02-09 LAB — RAD ONC ARIA SESSION SUMMARY
Course Elapsed Days: 13
Plan Fractions Treated to Date: 7
Plan Fractions Treated to Date: 7
Plan Prescribed Dose Per Fraction: 3 Gy
Plan Prescribed Dose Per Fraction: 3 Gy
Plan Total Fractions Prescribed: 10
Plan Total Fractions Prescribed: 10
Plan Total Prescribed Dose: 30 Gy
Plan Total Prescribed Dose: 30 Gy
Reference Point Dosage Given to Date: 21 Gy
Reference Point Dosage Given to Date: 21 Gy
Reference Point Session Dosage Given: 3 Gy
Reference Point Session Dosage Given: 3 Gy
Session Number: 8

## 2022-02-09 NOTE — Op Note (Signed)
Name: Garlon Tuggle [facility].    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 Tammi Klippel  TECHNIQUE:  The patient underwent a radiation treatment planning session in the radiation oncology simulation suite under the care of the radiation oncology physician and physicist.  I participated closely in the radiation treatment planning afterwards. The patient underwent planning CT which was fused to 3T high resolution MRI with 1 mm axial slices.  These images were fused on the planning system.  We contoured the gross target volumes and subsequently expanded this to yield the Planning Target Volume. I actively participated in the planning process.  I helped to define and review the target contours and also the contours of the optic pathway, eyes, brainstem and selected nearby organs at risk.  All the dose constraints for critical structures were reviewed and compared to AAPM Task Group 101.  The prescription dose conformity was reviewed.  I approved the plan electronically.    Accordingly, Kelly Setters.  was brought to the TrueBeam stereotactic radiation treatment linac and placed in the custom immobilization mask.  The patient was aligned according to the IR fiducial markers with BrainLab Exactrac, then orthogonal x-rays were used in ExacTrac with the 6DOF robotic table and the shifts were made to align the patient.  Kelly Setters. received stereotactic radiosurgery to a prescription dose of 20Gy uneventfully.    The detailed description of the procedure is recorded in the radiation oncology procedure note.  I was present for the duration of the procedure.  DISPOSITION:   Following delivery, the patient was transported to  nursing in stable condition and monitored for possible acute effects to be discharged to home in stable condition with follow-up in one month.  Elwin Sleight, [facility]

## 2022-02-10 ENCOUNTER — Ambulatory Visit
Admission: RE | Admit: 2022-02-10 | Discharge: 2022-02-10 | Disposition: A | Discharge status: Home / Self Care | Payer: Medicare Other | Source: Ambulatory Visit | Attending: Radiation Oncology | Admitting: Radiation Oncology

## 2022-02-10 ENCOUNTER — Other Ambulatory Visit: Payer: Self-pay

## 2022-02-10 DIAGNOSIS — Z510 Encounter for antineoplastic radiation therapy: Secondary | ICD-10-CM | POA: Diagnosis not present

## 2022-02-10 DIAGNOSIS — C7931 Secondary malignant neoplasm of brain: Secondary | ICD-10-CM | POA: Diagnosis not present

## 2022-02-10 DIAGNOSIS — C7951 Secondary malignant neoplasm of bone: Secondary | ICD-10-CM | POA: Diagnosis not present

## 2022-02-10 DIAGNOSIS — Z87891 Personal history of nicotine dependence: Secondary | ICD-10-CM | POA: Diagnosis not present

## 2022-02-10 DIAGNOSIS — C3432 Malignant neoplasm of lower lobe, left bronchus or lung: Secondary | ICD-10-CM | POA: Diagnosis not present

## 2022-02-10 LAB — RAD ONC ARIA SESSION SUMMARY
Course Elapsed Days: 14
Plan Fractions Treated to Date: 8
Plan Fractions Treated to Date: 8
Plan Prescribed Dose Per Fraction: 3 Gy
Plan Prescribed Dose Per Fraction: 3 Gy
Plan Total Fractions Prescribed: 10
Plan Total Fractions Prescribed: 10
Plan Total Prescribed Dose: 30 Gy
Plan Total Prescribed Dose: 30 Gy
Reference Point Dosage Given to Date: 24 Gy
Reference Point Dosage Given to Date: 24 Gy
Reference Point Session Dosage Given: 3 Gy
Reference Point Session Dosage Given: 3 Gy
Session Number: 9

## 2022-02-13 ENCOUNTER — Ambulatory Visit: Payer: Medicare Other

## 2022-02-13 ENCOUNTER — Ambulatory Visit
Admission: RE | Admit: 2022-02-13 | Discharge: 2022-02-13 | Disposition: A | Discharge status: Home / Self Care | Payer: Medicare Other | Source: Ambulatory Visit | Attending: Radiation Oncology | Admitting: Radiation Oncology

## 2022-02-13 ENCOUNTER — Other Ambulatory Visit: Payer: Self-pay

## 2022-02-13 DIAGNOSIS — C7931 Secondary malignant neoplasm of brain: Secondary | ICD-10-CM | POA: Diagnosis not present

## 2022-02-13 DIAGNOSIS — Z510 Encounter for antineoplastic radiation therapy: Secondary | ICD-10-CM | POA: Diagnosis not present

## 2022-02-13 DIAGNOSIS — C3432 Malignant neoplasm of lower lobe, left bronchus or lung: Secondary | ICD-10-CM | POA: Diagnosis not present

## 2022-02-13 DIAGNOSIS — Z87891 Personal history of nicotine dependence: Secondary | ICD-10-CM | POA: Diagnosis not present

## 2022-02-13 DIAGNOSIS — C7951 Secondary malignant neoplasm of bone: Secondary | ICD-10-CM | POA: Diagnosis not present

## 2022-02-13 LAB — RAD ONC ARIA SESSION SUMMARY
Course Elapsed Days: 17
Plan Fractions Treated to Date: 9
Plan Fractions Treated to Date: 9
Plan Prescribed Dose Per Fraction: 3 Gy
Plan Prescribed Dose Per Fraction: 3 Gy
Plan Total Fractions Prescribed: 10
Plan Total Fractions Prescribed: 10
Plan Total Prescribed Dose: 30 Gy
Plan Total Prescribed Dose: 30 Gy
Reference Point Dosage Given to Date: 27 Gy
Reference Point Dosage Given to Date: 27 Gy
Reference Point Session Dosage Given: 3 Gy
Reference Point Session Dosage Given: 3 Gy
Session Number: 10

## 2022-02-13 MED FILL — Fosaprepitant Dimeglumine For IV Infusion 150 MG (Base Eq): INTRAVENOUS | Qty: 5 | Status: AC

## 2022-02-13 MED FILL — Dexamethasone Sodium Phosphate Inj 100 MG/10ML: INTRAMUSCULAR | Qty: 1 | Status: AC

## 2022-02-13 NOTE — Progress Notes (Signed)
  Radiation Oncology         [phone]) [phone] ________________________________  Stereotactic Treatment Procedure Note  Name: Paeton Studer. MRN: [id number]  Date: 01/27/2022  DOB: Jun 13, 1950  SPECIAL TREATMENT PROCEDURE    ICD-10-CM   1. 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  It showed 3-dimensional radiation distributions overlaid onto the planning CT/MRI image set.  The [facility] for the target structures as well as the organs at risk were reviewed. The documentation of the 3D plan and dosimetry are filed in the radiation oncology EMR.  NARRATIVE:  Oron Westrup. was brought to the TrueBeam stereotactic radiation treatment machine and placed supine on the CT couch. The head frame was applied, and the patient was set up for stereotactic radiosurgery.  Neurosurgery was present for the set-up and delivery  SIMULATION VERIFICATION:  In the couch zero-angle position, the patient underwent Exactrac imaging using the Brainlab system with orthogonal KV images.  These were carefully aligned and repeated to confirm treatment position for each of the isocenters.  The Exactrac snap film verification was repeated at each couch angle.  PROCEDURE: Jerilynn Birkenhead. received stereotactic radiosurgery to the following targets: Left cerebellar 4 mm target was treated using 4 Dynamic Conformal Arcs to a prescription dose of 20 Gy.  ExacTrac registration was performed for each couch angle.  The 100% isodose line was prescribed.  6 MV X-rays were delivered in the flattening filter free beam mode.  STEREOTACTIC TREATMENT MANAGEMENT:  Following delivery, the patient was transported to nursing in stable condition and monitored for possible acute effects.  Vital signs were recorded. The patient  tolerated treatment without significant acute effects, and was discharged to home in stable condition.    PLAN: Follow-up in one month.  ________________________________  Artist Pais. Kathrynn Running, M.D.

## 2022-02-14 ENCOUNTER — Other Ambulatory Visit: Payer: Self-pay

## 2022-02-14 ENCOUNTER — Inpatient Hospital Stay: Payer: Medicare Other

## 2022-02-14 ENCOUNTER — Encounter: Payer: Self-pay | Admitting: Urology

## 2022-02-14 ENCOUNTER — Telehealth: Payer: Self-pay | Admitting: Medical Oncology

## 2022-02-14 ENCOUNTER — Other Ambulatory Visit: Payer: Medicare Other

## 2022-02-14 ENCOUNTER — Ambulatory Visit: Payer: Medicare Other

## 2022-02-14 ENCOUNTER — Encounter: Payer: Self-pay | Admitting: Internal Medicine

## 2022-02-14 ENCOUNTER — Ambulatory Visit
Admission: RE | Admit: 2022-02-14 | Discharge: 2022-02-14 | Disposition: A | Discharge status: Home / Self Care | Payer: Medicare Other | Source: Ambulatory Visit | Attending: Radiation Oncology | Admitting: Radiation Oncology

## 2022-02-14 ENCOUNTER — Inpatient Hospital Stay (HOSPITAL_BASED_OUTPATIENT_CLINIC_OR_DEPARTMENT_OTHER): Payer: Medicare Other | Admitting: Internal Medicine

## 2022-02-14 VITALS — BP 129/81 | HR 78 | Resp 17

## 2022-02-14 DIAGNOSIS — C7931 Secondary malignant neoplasm of brain: Secondary | ICD-10-CM | POA: Diagnosis not present

## 2022-02-14 DIAGNOSIS — C3492 Malignant neoplasm of unspecified part of left bronchus or lung: Secondary | ICD-10-CM

## 2022-02-14 DIAGNOSIS — C3432 Malignant neoplasm of lower lobe, left bronchus or lung: Secondary | ICD-10-CM | POA: Diagnosis not present

## 2022-02-14 DIAGNOSIS — Z95828 Presence of other vascular implants and grafts: Secondary | ICD-10-CM

## 2022-02-14 DIAGNOSIS — C7951 Secondary malignant neoplasm of bone: Secondary | ICD-10-CM | POA: Diagnosis not present

## 2022-02-14 DIAGNOSIS — Z87891 Personal history of nicotine dependence: Secondary | ICD-10-CM | POA: Diagnosis not present

## 2022-02-14 DIAGNOSIS — Z510 Encounter for antineoplastic radiation therapy: Secondary | ICD-10-CM | POA: Diagnosis not present

## 2022-02-14 LAB — RAD ONC ARIA SESSION SUMMARY
Course Elapsed Days: 18
Plan Fractions Treated to Date: 10
Plan Fractions Treated to Date: 10
Plan Prescribed Dose Per Fraction: 3 Gy
Plan Prescribed Dose Per Fraction: 3 Gy
Plan Total Fractions Prescribed: 10
Plan Total Fractions Prescribed: 10
Plan Total Prescribed Dose: 30 Gy
Plan Total Prescribed Dose: 30 Gy
Reference Point Dosage Given to Date: 30 Gy
Reference Point Dosage Given to Date: 30 Gy
Reference Point Session Dosage Given: 3 Gy
Reference Point Session Dosage Given: 3 Gy
Session Number: 11

## 2022-02-14 LAB — CBC WITH DIFFERENTIAL (CANCER CENTER ONLY)
Abs Immature Granulocytes: 0.54 10*3/uL — ABNORMAL HIGH (ref 0.00–0.07)
Basophils Absolute: 0 10*3/uL (ref 0.0–0.1)
Basophils Relative: 0 %
Eosinophils Absolute: 0 10*3/uL (ref 0.0–0.5)
Eosinophils Relative: 0 %
HCT: 30.1 % — ABNORMAL LOW (ref 39.0–52.0)
Hemoglobin: 10.2 g/dL — ABNORMAL LOW (ref 13.0–17.0)
Immature Granulocytes: 6 %
Lymphocytes Relative: 2 %
Lymphs Abs: 0.2 10*3/uL — ABNORMAL LOW (ref 0.7–4.0)
MCH: 33.9 pg (ref 26.0–34.0)
MCHC: 33.9 g/dL (ref 30.0–36.0)
MCV: 100 fL (ref 80.0–100.0)
Monocytes Absolute: 0.6 10*3/uL (ref 0.1–1.0)
Monocytes Relative: 7 %
Neutro Abs: 8.2 10*3/uL — ABNORMAL HIGH (ref 1.7–7.7)
Neutrophils Relative %: 85 %
Platelet Count: 94 10*3/uL — ABNORMAL LOW (ref 150–400)
RBC: 3.01 MIL/uL — ABNORMAL LOW (ref 4.22–5.81)
RDW: 14.5 % (ref 11.5–15.5)
WBC Count: 9.6 10*3/uL (ref 4.0–10.5)
nRBC: 1.2 % — ABNORMAL HIGH (ref 0.0–0.2)

## 2022-02-14 LAB — CMP (CANCER CENTER ONLY)
ALT: 123 U/L — ABNORMAL HIGH (ref 0–44)
AST: 70 U/L — ABNORMAL HIGH (ref 15–41)
Albumin: 3 g/dL — ABNORMAL LOW (ref 3.5–5.0)
Alkaline Phosphatase: 369 U/L — ABNORMAL HIGH (ref 38–126)
Anion gap: 6 (ref 5–15)
BUN: 33 mg/dL — ABNORMAL HIGH (ref 8–23)
CO2: 37 mmol/L — ABNORMAL HIGH (ref 22–32)
Calcium: 9 mg/dL (ref 8.9–10.3)
Chloride: 97 mmol/L — ABNORMAL LOW (ref 98–111)
Creatinine: 0.67 mg/dL (ref 0.61–1.24)
GFR, Estimated: >60 mL/min (ref >60)
Glucose, Bld: 158 mg/dL — ABNORMAL HIGH (ref 70–99)
Potassium: 3.4 mmol/L — ABNORMAL LOW (ref 3.5–5.1)
Sodium: 140 mmol/L (ref 135–145)
Total Bilirubin: 1.3 mg/dL — ABNORMAL HIGH (ref 0.3–1.2)
Total Protein: 5.6 g/dL — ABNORMAL LOW (ref 6.5–8.1)

## 2022-02-14 MED ORDER — SODIUM CHLORIDE 0.9 % IV SOLN
1500.0000 mg | Freq: Once | INTRAVENOUS | Status: AC
  Administered 2022-02-14: 1500 mg via INTRAVENOUS
  Filled 2022-02-14: qty 30

## 2022-02-14 MED ORDER — SODIUM CHLORIDE 0.9% FLUSH
10.0000 mL | Freq: Once | INTRAVENOUS | Status: AC
  Administered 2022-02-14: 10 mL

## 2022-02-14 MED ORDER — HEPARIN SOD (PORK) LOCK FLUSH 100 UNIT/ML IV SOLN
500.0000 [IU] | Freq: Once | INTRAVENOUS | Status: AC | PRN
  Administered 2022-02-14: 500 [IU]

## 2022-02-14 MED ORDER — SODIUM CHLORIDE 0.9 % IV SOLN
150.0000 mg | Freq: Once | INTRAVENOUS | Status: AC
  Administered 2022-02-14: 150 mg via INTRAVENOUS
  Filled 2022-02-14: qty 150

## 2022-02-14 MED ORDER — SODIUM CHLORIDE 0.9 % IV SOLN
100.0000 mg/m2 | Freq: Once | INTRAVENOUS | Status: AC
  Administered 2022-02-14: 180 mg via INTRAVENOUS
  Filled 2022-02-14: qty 9

## 2022-02-14 MED ORDER — SODIUM CHLORIDE 0.9 % IV SOLN: Freq: Once | INTRAVENOUS | Status: AC

## 2022-02-14 MED ORDER — MORPHINE SULFATE 15 MG PO TABS: 15.0000 mg | ORAL_TABLET | Freq: Four times a day (QID) | ORAL | 0 refills | Status: AC | PRN

## 2022-02-14 MED ORDER — PALONOSETRON HCL INJECTION 0.25 MG/5ML
0.2500 mg | Freq: Once | INTRAVENOUS | Status: AC
  Administered 2022-02-14: 0.25 mg via INTRAVENOUS
  Filled 2022-02-14: qty 5

## 2022-02-14 MED ORDER — SODIUM CHLORIDE 0.9% FLUSH
10.0000 mL | INTRAVENOUS | Status: DC | PRN
  Administered 2022-02-14: 10 mL

## 2022-02-14 MED ORDER — SODIUM CHLORIDE 0.9 % IV SOLN
444.5000 mg | Freq: Once | INTRAVENOUS | Status: AC
  Administered 2022-02-14: 450 mg via INTRAVENOUS
  Filled 2022-02-14: qty 45

## 2022-02-14 MED ORDER — TRILACICLIB DIHYDROCHLORIDE INJECTION 300 MG
240.0000 mg/m2 | Freq: Once | INTRAVENOUS | Status: AC
  Administered 2022-02-14: 435 mg via INTRAVENOUS
  Filled 2022-02-14: qty 29

## 2022-02-14 MED ORDER — SODIUM CHLORIDE 0.9 % IV SOLN
10.0000 mg | Freq: Once | INTRAVENOUS | Status: AC
  Administered 2022-02-14: 10 mg via INTRAVENOUS
  Filled 2022-02-14: qty 10

## 2022-02-14 MED ORDER — MORPHINE SULFATE ER 30 MG PO TBCR: 30.0000 mg | EXTENDED_RELEASE_TABLET | Freq: Two times a day (BID) | ORAL | 0 refills | Status: AC

## 2022-02-14 MED FILL — Dexamethasone Sodium Phosphate Inj 100 MG/10ML: INTRAMUSCULAR | Qty: 1 | Status: AC

## 2022-02-14 NOTE — Patient Instructions (Addendum)
[facility] MEDICAL [facility]COLOG[facility]  Discharge Instructions: Thank you for choosing [facility] to provide your oncology and hematology care.   If you have a lab appointment with the Cancer Center, please go directly to the Cancer Center and check in at the registration area.   Wear comfortable clothing and clothing appropriate for easy access to any Portacath or PICC line.   We strive to give you quality time with your provider. You may need to reschedule your appointment if you arrive late (15 or more minutes).  Arriving late affects you and other patients whose appointments are after yours.  Also, if you miss three or more appointments without notifying the office, you may be dismissed from the clinic at the provider's discretion.      For prescription refill requests, have your pharmacy contact our office and allow 72 hours for refills to be completed.    Today you received the following chemotherapy and/or immunotherapy agents: Cosela, Imfinzi, Etoposide, Carboplatin.    To help prevent nausea and vomiting after your treatment, we encourage you to take your nausea medication as directed.  BELOW ARE SYMPTOMS THAT SHOULD BE REPORTED IMMEDIATELY: *FEVER GREATER THAN 100.4 F (38 C) OR HIGHER *CHILLS OR SWEATING *NAUSEA AND VOMITING THAT IS NOT CONTROLLED WITH YOUR NAUSEA MEDICATION *UNUSUAL SHORTNESS OF BREATH *UNUSUAL BRUISING OR BLEEDING *URINARY PROBLEMS (pain or burning when urinating, or frequent urination) *BOWEL PROBLEMS (unusual diarrhea, constipation, pain near the anus) TENDERNESS IN MOUTH AND THROAT WITH OR WITHOUT PRESENCE OF ULCERS (sore throat, sores in mouth, or a toothache) UNUSUAL RASH, SWELLING OR PAIN  UNUSUAL VAGINAL DISCHARGE OR ITCHING   Items with * indicate a potential emergency and should be followed up as soon as possible or go to the Emergency Department if any problems should occur.  Please show the CHEMOTHERAPY ALERT CARD or  IMMUNOTHERAPY ALERT CARD at check-in to the Emergency Department and triage nurse.  Should you have questions after your visit or need to cancel or reschedule your appointment, please contact [facility]  Dept: [phone]  and follow the prompts.  Office hours are 8:00 a.m. to 4:30 p.m. Monday - Friday. Please note that voicemails left after 4:00 p.m. may not be returned until the following business day.  We are closed weekends and major holidays. You have access to a nurse at all times for urgent questions. Please call the main number to the clinic Dept: [phone] and follow the prompts.   For any non-urgent questions, you may also contact your provider using MyChart. We now offer e-Visits for anyone 32 and older to request care online for non-urgent symptoms. For details visit [email or web address].   Also download the MyChart app! Go to the app store, search "MyChart", open the app, select [place], and log in with your MyChart username and password.  Trilaciclib Injection What is this medication? TRILACICLIB (TRYE la SYE klib) prevents low levels of red blood cells, white blood cells, and platelets caused by chemotherapy. It works by protecting the cells in your bone marrow that make these blood cells. This lowers the risk of infection and bleeding. It also reduces the need for blood transfusions. This medicine may be used for other purposes; ask your health care provider or pharmacist if you have questions. COMMON BRAND NAME(S): COSELA What should I tell my care team before I take this medication? They need to know if you have any of these conditions: Liver disease An unusual or allergic  reaction to trilaciclib, other medications, foods, dyes, or preservatives Pregnant or trying to get pregnant Breastfeeding How should I use this medication? This medication is injected into a vein. It is given by your care team in a hospital or clinic setting. Talk  to your care team about the use of this medication in children. Special care may be needed. Overdosage: If you think you have taken too much of this medicine contact a poison control center or emergency room at once. NOTE: This medicine is only for you. Do not share this medicine with others. What if I miss a dose? Keep appointments for follow-up doses. It is important to not miss your dose. Call your care team if you are unable to keep an appointment. What may interact with this medication? Cisplatin Dalfampridine Dofetilide Metformin This list may not describe all possible interactions. Give your health care provider a list of all the medicines, herbs, non-prescription drugs, or dietary supplements you use. Also tell them if you smoke, drink alcohol, or use illegal drugs. Some items may interact with your medicine. What should I watch for while using this medication? Your condition will be monitored carefully while you are receiving this medication. Talk to your care team if you may be pregnant. Serious birth defects can occur if you take this medication during pregnancy and for 3 weeks after the last dose. You will need a negative pregnancy test before starting this medication. Contraception is recommended while taking this medication and for 3 weeks after the last dose. Your care team can help you find the option that works for you. Do not breastfeed while taking this medication and for 3 weeks after the last dose. This medication may cause infertility. Talk to your care team if you are concerned about your fertility. What side effects may I notice from receiving this medication? Side effects that you should report to your care team as soon as possible: Allergic reactions--skin rash, itching, hives, swelling of the face, lips, tongue, or throat Dry cough, shortness of breath or trouble breathing Painful swelling, warmth, or redness of the skin, blisters or sores at the infusion site Side  effects that usually do not require medical attention (report these to your care team if they continue or are bothersome): Fatigue Headache This list may not describe all possible side effects. Call your doctor for medical advice about side effects. You may report side effects to FDA at 1-800-FDA-1088. Where should I keep my medication? This medication is given in a hospital or clinic. It will not be stored at home. NOTE: This sheet is a summary. It may not cover all possible information. If you have questions about this medicine, talk to your doctor, pharmacist, or health care provider.  2023 Elsevier/Gold Standard (2021-09-08 00:00:00) Durvalumab Injection What is this medication? DURVALUMAB (dur VAL ue mab) treats some types of cancer. It works by helping your immune system slow or stop the spread of cancer cells. It is a monoclonal antibody. This medicine may be used for other purposes; ask your health care provider or pharmacist if you have questions. COMMON BRAND NAME(S): IMFINZI What should I tell my care team before I take this medication? They need to know if you have any of these conditions: Allogeneic stem cell transplant (uses someone else's stem cells) Autoimmune diseases, such as Crohn disease, ulcerative colitis, lupus History of chest radiation Nervous system problems, such as Guillain-Barre syndrome, myasthenia gravis Organ transplant An unusual or allergic reaction to durvalumab, other medications, foods, dyes,  or preservatives Pregnant or trying to get pregnant Breast-feeding How should I use this medication? This medication is infused into a vein. It is given by your care team in a hospital or clinic setting. A special MedGuide will be given to you before each treatment. Be sure to read this information carefully each time. Talk to your care team about the use of this medication in children. Special care may be needed. Overdosage: If you think you have taken too much  of this medicine contact a poison control center or emergency room at once. NOTE: This medicine is only for you. Do not share this medicine with others. What if I miss a dose? Keep appointments for follow-up doses. It is important not to miss your dose. Call your care team if you are unable to keep an appointment. What may interact with this medication? Interactions have not been studied. This list may not describe all possible interactions. Give your health care provider a list of all the medicines, herbs, non-prescription drugs, or dietary supplements you use. Also tell them if you smoke, drink alcohol, or use illegal drugs. Some items may interact with your medicine. What should I watch for while using this medication? Your condition will be monitored carefully while you are receiving this medication. You may need blood work while taking this medication. This medication may cause serious skin reactions. They can happen weeks to months after starting the medication. Contact your care team right away if you notice fevers or flu-like symptoms with a rash. The rash may be red or purple and then turn into blisters or peeling of the skin. You may also notice a red rash with swelling of the face, lips, or lymph nodes in your neck or under your arms. Tell your care team right away if you have any change in your eyesight. Talk to your care team if you may be pregnant. Serious birth defects can occur if you take this medication during pregnancy and for 3 months after the last dose. You will need a negative pregnancy test before starting this medication. Contraception is recommended while taking this medication and for 3 months after the last dose. Your care team can help you find the option that works for you. Do not breastfeed while taking this medication and for 3 months after the last dose. What side effects may I notice from receiving this medication? Side effects that you should report to your care team  as soon as possible: Allergic reactions--skin rash, itching, hives, swelling of the face, lips, tongue, or throat Dry cough, shortness of breath or trouble breathing Eye pain, redness, irritation, or discharge with blurry or decreased vision Heart muscle inflammation--unusual weakness or fatigue, shortness of breath, chest pain, fast or irregular heartbeat, dizziness, swelling of the ankles, feet, or hands Hormone gland problems--headache, sensitivity to light, unusual weakness or fatigue, dizziness, fast or irregular heartbeat, increased sensitivity to cold or heat, excessive sweating, constipation, hair loss, increased thirst or amount of urine, tremors or shaking, irritability Infusion reactions--chest pain, shortness of breath or trouble breathing, feeling faint or lightheaded Kidney injury (glomerulonephritis)--decrease in the amount of urine, red or dark brown urine, foamy or bubbly urine, swelling of the ankles, hands, or feet Liver injury--right upper belly pain, loss of appetite, nausea, light-colored stool, dark yellow or brown urine, yellowing skin or eyes, unusual weakness or fatigue Pain, tingling, or numbness in the hands or feet, muscle weakness, change in vision, confusion or trouble speaking, loss of balance or coordination, trouble  walking, seizures Rash, fever, and swollen lymph nodes Redness, blistering, peeling, or loosening of the skin, including inside the mouth Sudden or severe stomach pain, bloody diarrhea, fever, nausea, vomiting Side effects that usually do not require medical attention (report these to your care team if they continue or are bothersome): Bone, joint, or muscle pain Diarrhea Fatigue Loss of appetite Nausea Skin rash This list may not describe all possible side effects. Call your doctor for medical advice about side effects. You may report side effects to FDA at 1-800-FDA-1088. Where should I keep my medication? This medication is given in a hospital or  clinic. It will not be stored at home. NOTE: This sheet is a summary. It may not cover all possible information. If you have questions about this medicine, talk to your doctor, pharmacist, or health care provider.  2023 Elsevier/Gold Standard (2021-05-09 00:00:00)

## 2022-02-14 NOTE — Telephone Encounter (Signed)
Per their records pt is  opioid naive so pharmacy can only fill his Morphine prescriptions for 5 days initially .   Future prescriptions- Dr. Arbutus Ped can go ahead and send a future prescription for 30 days today and they will hold it and dispense it if pt has done well on the narcotics for 5 days.

## 2022-02-14 NOTE — Progress Notes (Signed)
[facility] Telephone:(336) [phone]   Fax:(336) [phone]  OFFICE PROGRESS NOTE  Georgann Housekeeper, MD [address] [place] [address]  DIAGNOSIS: Recurrent/metastatic small cell lung cancer initially diagnosed as unresectable limited stage IIb (T3, N0, M0) small cell lung cancer presented with 2 pulmonary nodules in the left lower lobe with no evidence of metastatic disease to the lymph nodes or distant metastasis diagnosed in March 2023.  The patient has disease recurrence with metastasis to the left lung as well as bone and brain in December 2023.  PRIOR THERAPY:  1) Systemic chemotherapy with carboplatin for AUC of 5 and etoposide 100 Mg/M2 on days 1, 2 and 3 every 3 weeks.  First dose started 05/02/2021.  Status post 4 cycles.  This is concurrent with radiotherapy under the care of Dr. Kathrynn Running. 2) SBRT to solitary brain metastasis under the care of Dr. Kathrynn Running.  CURRENT THERAPY: Systemic chemotherapy with carboplatin for AUC of 5 on day 1 and etoposide 100 Mg/M2 on days 1, 2 and 3 with Cosela 240 Mg/M2 on the days of the chemotherapy as well as Imfinzi 1500 Mg IV on day 1 every 3 weeks with the chemotherapy and then every 4 weeks as maintenance after the first 4 cycles.  First dose January 24, 2022.  INTERVAL HISTORY: Kelly Olson. 72 y.o. male returns to the clinic today for follow-up visit accompanied by his wife.  The patient continues to complain of significant pain in the lower back with radiation to the left hip area.  He is not currently on any pain medication.  He cannot sleep at night because of the pain.  He denied having any current chest pain, shortness of breath, cough or hemoptysis.  He has no nausea, vomiting, diarrhea or constipation.  He has no headache or visual changes.  He denied having any fever or chills.  He lost a lot of weight recently secondary to lack of appetite and dysphagia.  He is here today for evaluation before  starting cycle #2 of his treatment.    MEDICAL HISTORY: Past Medical History:  Diagnosis Date   Coronary artery disease, occlusive 06/02/2014   Multivessel CAD.mLAD-100%, mRCA 99%, dRCA 100%, mLCX 90%  And EF 35-45%. -->  Referred for CABG; nonischemic Myoview March 2020   Former heavy tobacco smoker    Quit in April 2016    GERD (gastroesophageal reflux disease)    Hyperlipidemia with target LDL less than 70    Hypertension    Ischemic cardiomyopathy - resolved 05/2014   Myoview: EF ~33% with "infarction vs. severe resting ischemia in LAD & RCA territory; b) EF by Cath: 35-45%. 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   Stage T2 a, N0    ALLERGIES:  has No Known Allergies.  MEDICATIONS:  Current Outpatient Medications  Medication Sig Dispense Refill   albuterol (VENTOLIN HFA) 108 (90 Base) MCG/ACT inhaler Inhale 2 puffs into the lungs every 6 (six) hours as needed for shortness of breath or wheezing.     aspirin 81 MG tablet Take 81 mg by mouth daily.     atorvastatin (LIPITOR) 40 MG tablet TAKE  1 TABLET AT BEDTIME (Patient taking differently: Take 40 mg by mouth at bedtime.) 90 tablet 3   carvedilol (COREG) 3.125 MG tablet TAKE 2 TABLETS TWICE A DAY WITH     MEALS (Patient taking differently: Take 6.25 mg by mouth 2 (two) times daily with a meal.) 360 tablet 2   losartan (COZAAR) 25 MG tablet Take 1 tablet (25 mg total) by mouth daily. 90 tablet 3   metFORMIN (GLUCOPHAGE-XR) 500 MG 24 hr tablet Take 500 mg by mouth 2 (two) times daily.     potassium chloride SA (KLOR-CON M) 20 MEQ tablet Take 1 tablet (20 mEq total) by mouth 2 (two) times daily. 20 tablet 0   Pseudoephedrine-APAP 30-325 MG TABS Take 1 tablet by  mouth daily as needed (sinus congestion).     sildenafil (VIAGRA) 50 MG tablet Take 1 tablet (50 mg total) by mouth daily as needed for erectile dysfunction. 10 tablet 5   No current facility-administered medications for this visit.    SURGICAL HISTORY:  Past Surgical History:  Procedure Laterality Date   CARDIAC CATHETERIZATION N/A 06/02/2014   Procedure: Left Heart Cath and Coronary Angiography;  Surgeon: Marykay Lex, MD;  Location: [facility] INVASIVE CV LAB CUPID;  Service: Cardiovascular;  mLAD-100%, mRCA 99%, dRCA 100%, mLCX 90%  And EF 35-45%.   CORONARY ARTERY BYPASS GRAFT N/A 06/04/2014   Procedure: CORONARY ARTERY BYPASS GRAFTING (CABG) times four using bilateral internal mammary arteries and EVH for left  leg saphenous vein;  Surgeon: Alleen Borne, MD;  Location: [facility] OR;  Service: Open Heart Surgery;  LIMA-OM, RIMA-LAD, SVG-Diag, SVG-rPDA   IR IMAGING GUIDED PORT INSERTION  05/16/2021   NM MYOVIEW LTD  05/28/2014   Pre-CABG:  High Risk Nuclear Stress Test with multivessel distribution ischemia.  -- Severely ischemic Cardiomyoapthy with evidence of at least 2 vessel disease & EF of ~33%.  Images are consistent with either infarction or severe resting ischemia in the LAD & RCA territory.   NM MYOVIEW LTD  03/2018   EF 50-55%.  Small basal inferior fixed defect consistent with diaphragmatic attenuation.  No ischemia or infarction.  Septal dyskinesis due to post CABG state   TEE WITHOUT CARDIOVERSION N/A 06/04/2014   Procedure: TRANSESOPHAGEAL ECHOCARDIOGRAM (TEE);  Surgeon: Alleen Borne, MD;  Location: [facility] OR;  Service: Open Heart Surgery;  Laterality: N/A;   THORACOTOMY Right 07/10/2016   Procedure: THORACOTOMY;  Surgeon: Alleen Borne, MD;  Location: [facility] OR;  Service: Thoracic;  Laterality: Right;   THORACOTOMY/LOBECTOMY Right 07/29/2018   Procedure: THORACOTOMY/RIGHT LOWER LOBECTOMY;  Surgeon: Alleen Borne, MD;  Location: [facility] OR;  Service: Thoracic;  Laterality: Right;    TONSILLECTOMY  1957   TRANSTHORACIC ECHOCARDIOGRAM  08/10/2021   EF 60 to 65%.  GR 1 DD.  No RWMA.  Normal RV size and function.  Normal PAP.  Normal aortic and mitral valves.  Normal RAP.  Conclusion(s)/Recommendation(s): Normal biventricular function withoutevidence of hemodynamically significant valvular heart disease. . EF 50-55% with mild HK of basal anteroseptal myocardium. GR 1 DD.   VIDEO ASSISTED THORACOSCOPY (VATS)/WEDGE RESECTION Right 07/10/2016   Procedure: VIDEO ASSISTED THORACOSCOPY (VATS)/WEDGE RESECTION;  Surgeon: Alleen Borne, MD;  Location: [facility] OR;  Service: Thoracic;  Laterality: Right;   VIDEO BRONCHOSCOPY WITH ENDOBRONCHIAL NAVIGATION N/A 04/13/2021   Procedure: VIDEO BRONCHOSCOPY WITH ENDOBRONCHIAL NAVIGATION;  Surgeon: Loreli Slot, MD;  Location: [facility] OR;  Service: Thoracic;  Laterality: N/A;    REVIEW OF SYSTEMS:  Constitutional: positive  for anorexia, fatigue, and weight loss Eyes: negative Ears, nose, mouth, throat, and face: negative Respiratory: negative Cardiovascular: negative Gastrointestinal: positive for dysphagia Genitourinary:negative Integument/breast: negative Hematologic/lymphatic: negative Musculoskeletal:positive for bone pain and muscle weakness Neurological: positive for weakness Behavioral/Psych: negative Endocrine: negative Allergic/Immunologic: negative   PHYSICAL EXAMINATION: General appearance: alert, cooperative, fatigued, and no distress Head: Normocephalic, without obvious abnormality, atraumatic Neck: no adenopathy, no JVD, supple, symmetrical, trachea midline, and thyroid not enlarged, symmetric, no tenderness/mass/nodules Lymph nodes: Cervical, supraclavicular, and axillary nodes normal. Resp: clear to auscultation bilaterally Back: symmetric, no curvature. ROM normal. No CVA tenderness. Cardio: regular rate and rhythm, S1, S2 normal, no murmur, click, rub or gallop GI: soft, non-tender; bowel sounds normal; no masses,  no  organomegaly Extremities: extremities normal, atraumatic, no cyanosis or edema Neurologic: Alert and oriented X 3, normal strength and tone. Normal symmetric reflexes. Normal coordination and gait  ECOG PERFORMANCE STATUS: 1 - Symptomatic but completely ambulatory  Blood pressure 129/74, pulse 80, temperature 98.4 F (36.9 C), temperature source Oral, resp. rate 18, weight 139 lb 11.2 oz (63.4 kg), SpO2 96 %.  LABORATORY DATA: Lab Results  Component Value Date   WBC 1.8 (L) 02/07/2022   HGB 10.6 (L) 02/07/2022   HCT 30.3 (L) 02/07/2022   MCV 98.4 02/07/2022   PLT 42 (L) 02/07/2022      Chemistry      Component Value Date/Time   NA 142 02/07/2022 1139   NA 142 04/08/2018 0858   K 2.7 (LL) 02/07/2022 1139   CL 97 (L) 02/07/2022 1139   CO2 40 (H) 02/07/2022 1139   BUN 44 (H) 02/07/2022 1139   BUN 24 04/08/2018 0858   CREATININE 0.63 02/07/2022 1139   CREATININE 0.85 01/03/2016 0840      Component Value Date/Time   CALCIUM 9.0 02/07/2022 1139   ALKPHOS 222 (H) 02/07/2022 1139   AST 39 02/07/2022 1139   ALT 102 (H) 02/07/2022 1139   BILITOT 0.9 02/07/2022 1139       RADIOGRAPHIC STUDIES: CT Chest W Contrast  Result Date: 02/07/2022 CLINICAL DATA:  Small-cell lung cancer staging. * Tracking Code: BO * EXAM: CT CHEST WITH CONTRAST TECHNIQUE: Multidetector CT imaging of the chest was performed during intravenous contrast administration. RADIATION DOSE REDUCTION: This exam was performed according to the departmental dose-optimization program which includes automated exposure control, adjustment of the mA and/or kV according to patient size and/or use of iterative reconstruction technique. CONTRAST:  76mL OMNIPAQUE IOHEXOL 300 MG/ML  SOLN COMPARISON:  X-ray 01/18/2022. PET-CT 01/12/2022. CT 11/04/2021. Older exams as well. FINDINGS: Cardiovascular: Right upper chest port identified. Slightly atrophic appearing internal jugular vein. Status post median sternotomy. The heart is  nonenlarged. No significant pericardial effusion. The thoracic aorta has a normal course and caliber with scattered atherosclerotic plaque. Minimal focal ectasia along the aortic arch. Bovine type aortic arch identified. Coronary artery calcifications are noted. Mediastinum/Nodes: There is no specific abnormal lymph node enlargement seen in the axillary region, hilum or mediastinum. Normal caliber thoracic esophagus. Slightly heterogeneous thyroid gland. Lungs/Pleura: Advanced emphysematous lung changes identified, centrilobular diffusely seen. There is a tiny left pleural effusion greater than right. Areas of interstitial septal thickening identified and scarring/fibrosis. Patient is status post right-sided lobectomy lower lobe. There appears to be some asymmetric parenchymal opacity along the left lower lobe and dependent lingula. The lingular areas are increased from previous. Lower lobe is similar. Previously there were some small lung nodules. This includes a left lower lobe nodule measuring 8 x  9 mm on the prior and today this same nodule on image 108 of series 7 measures 10 x 12 mm. The more dependent areas in the left lower lobe towards the diaphragm are poorly seen with the increasing opacity which has been developing since the prior standard CT scan of October 2023. One areas somewhat nodular in this location as measured on the prior PET-CT scan. On the PET-CT this was measured at 15 mm and today measured in similar fashion on series 7, image 114 measures 17 mm. Upper Abdomen: Along the upper abdomen the adrenal glands are diffusely thickened bilaterally. Please correlate for history. Hypertrophy is possible versus a lesion. This is significantly increased from previous. Fatty liver infiltration identified. Musculoskeletal: Mild degenerative changes seen along the spine. If there is concern of osseous metastatic disease, bone scan can be performed as clinically directed. Anasarca. IMPRESSION: Left lower  lobe areas of nodularity appear to be slightly increasing from previous examinations. Some of these areas are partially obscured by the increasing parenchymal opacities in the lingula and left lower lobe. Extensive emphysematous lung changes as well as areas of scarring and fibrotic change. Tiny bilateral pleural effusions are identified. Left is similar to previous. The right is increasing. Chest port. No developing new lymph node enlargement. In the upper abdomen the adrenal glands today are diffusely thickened compared to the previous examinations and this is a new finding from October 2023 and December 2023. Please correlate with any known history or clinical evidence of adrenal abnormality. Otherwise additional workup with MRI with and without contrast may be of benefit as the next step in the workup. Aortic Atherosclerosis (ICD10-I70.0) and Emphysema (ICD10-J43.9). Electronically Signed   By: Karen Kays M.D.   On: 02/07/2022 18:50   DG Chest Portable 1 View  Result Date: 01/26/2022 CLINICAL DATA:  Hypoxia, lung cancer EXAM: PORTABLE CHEST 1 VIEW COMPARISON:  12/14/2021 FINDINGS: There is progressive consolidation within the left lung base initial may be infectious or inflammatory as can be seen with post radiation change in the appropriate clinical setting. Small bilateral pleural effusions are present. No pneumothorax. Right internal jugular chest port tip noted within the superior vena cava. Coronary artery bypass grafting has been performed. Cardiac size within normal limits. Pulmonary vascularity is normal. No acute bone abnormality. IMPRESSION: 1. Progressive consolidation within the left lung base, possibly infectious or inflammatory as can be seen with post radiation change in the appropriate clinical setting. Electronically Signed   By: Helyn Numbers M.D.   On: 01/26/2022 18:57   MR Brain W Wo Contrast  Result Date: 01/19/2022 CLINICAL DATA:  Metastatic disease evaluation 3T SRS Protocol  for radiation treatment planning EXAM: MRI HEAD WITHOUT AND WITH CONTRAST TECHNIQUE: Multiplanar, multiecho pulse sequences of the brain and surrounding structures were obtained without and with intravenous contrast. CONTRAST:  34mL GADAVIST GADOBUTROL 1 MMOL/ML IV SOLN COMPARISON:  MRI 01/08/2022. FINDINGS: Brain: Unchanged 4 mm enhancing lesion in the inferomedial left cerebellum, suspicious for a metastasis. No other enhancing lesions identified. No evidence of acute hemorrhage, acute infarct, midline shift, or hydrocephalus. Vascular: Major arterial flow voids are maintained at the skull base. Skull and upper cervical spine: Multiple T1 hypointense lesions in the cervical spine suspicious for osseous metastases. Overall, metastatic osseous disease better characterized on recent PET-CT. Sinuses/Orbits: Negative. IMPRESSION: 1. Unchanged 4 mm enhancing lesion in the inferomedial left cerebellum, suspicious for a metastasis. No other enhancing intracranial lesions identified. 2. Osseous metastatic disease, better characterized on PET-CT. Electronically Signed  By: Feliberto Harts M.D.   On: 01/19/2022 08:51    ASSESSMENT AND PLAN: This is a very pleasant 72 years old white male diagnosed with unresectable limited stage (T3, N0, M0 small cell lung cancer presented with 2 nodules in the left lower lobe with no evidence of metastatic disease to the thoracic lymph nodes or distant metastases diagnosed in March 2023. The patient underwent a course of systemic chemotherapy with carboplatin for AUC of 5 on day 1 and etoposide 100 Mg/M2 on days 1, 2 and 3 status post 4 cycles.  This is concurrent with radiotherapy.   This was followed by prophylactic cranial irradiation under the care of Dr. Kathrynn Running. The patient has been on observation and feeling fine except for recent weight loss and hyponatremia. He had repeat staging workup including PET scan as well as MRI of the brain performed recently.  I personally and  independently reviewed the imaging studies and discussed it and showed the images to the patient and his wife. Unfortunately his PET scan showed evidence for metastatic disease to the pleural-based nodules in the left lung as well as bone metastasis including the right pelvic area. He also had solitary 4 mm brain metastasis. For the bone and brain metastasis, I will refer him to Dr. Kathrynn Running for discussion of the palliative radiotherapy to these areas. The patient is currently undergoing systemic chemotherapy with carboplatin for AUC of 5 on day 1, etoposide 100 Mg/M2 on days 1, 2 and 3 with Cosela 240 Mg/M2 before the chemotherapy and Imfinzi 1500 Mg IV every 3 weeks with the chemotherapy.  Status post 1 cycle. I recommended for the patient to proceed with cycle #2 today as planned. For the pain management, I will start him on MS Contin 30 mg p.o. every 12 hours in addition to MS IR 15 mg every 6 hours as needed for breakthrough pain.  I will also refer the patient to the palliative care team for close monitoring and management of his pain issues. For the dysphagia, he will continue his current treatment with Carafate. For the weight loss, I will refer the patient to the dietitian at the cancer center for evaluation and advice regarding his condition. The patient will come back for follow-up visit in 3 weeks for evaluation before starting cycle #3. He was advised to call immediately if he has any other concerning symptoms in the interval.  The patient voices understanding of current disease status and treatment options and is in agreement with the current care plan.  All questions were answered. The patient knows to call the clinic with any problems, questions or concerns. We can certainly see the patient much sooner if necessary.  The total time spent in the appointment was 55 minutes.  Disclaimer: This note was dictated with voice recognition software. Similar sounding words can inadvertently be  transcribed and may not be corrected upon review.

## 2022-02-14 NOTE — Progress Notes (Signed)
Per [place] Ped, ok to treat today with PLT 94 and ALT 123

## 2022-02-15 ENCOUNTER — Ambulatory Visit: Payer: Medicare Other

## 2022-02-15 ENCOUNTER — Inpatient Hospital Stay: Payer: Medicare Other

## 2022-02-15 VITALS — BP 130/75 | HR 80 | Temp 98.0°F | Resp 18

## 2022-02-15 DIAGNOSIS — C7931 Secondary malignant neoplasm of brain: Secondary | ICD-10-CM | POA: Diagnosis not present

## 2022-02-15 DIAGNOSIS — C7951 Secondary malignant neoplasm of bone: Secondary | ICD-10-CM | POA: Diagnosis not present

## 2022-02-15 DIAGNOSIS — C3432 Malignant neoplasm of lower lobe, left bronchus or lung: Secondary | ICD-10-CM | POA: Diagnosis not present

## 2022-02-15 DIAGNOSIS — C3492 Malignant neoplasm of unspecified part of left bronchus or lung: Secondary | ICD-10-CM

## 2022-02-15 MED ORDER — SODIUM CHLORIDE 0.9 % IV SOLN
100.0000 mg/m2 | Freq: Once | INTRAVENOUS | Status: AC
  Administered 2022-02-15: 180 mg via INTRAVENOUS
  Filled 2022-02-15: qty 9

## 2022-02-15 MED ORDER — SODIUM CHLORIDE 0.9 % IV SOLN: Freq: Once | INTRAVENOUS | Status: AC

## 2022-02-15 MED ORDER — TRILACICLIB DIHYDROCHLORIDE INJECTION 300 MG
240.0000 mg/m2 | Freq: Once | INTRAVENOUS | Status: AC
  Administered 2022-02-15: 435 mg via INTRAVENOUS
  Filled 2022-02-15: qty 29

## 2022-02-15 MED ORDER — HEPARIN SOD (PORK) LOCK FLUSH 100 UNIT/ML IV SOLN
500.0000 [IU] | Freq: Once | INTRAVENOUS | Status: AC | PRN
  Administered 2022-02-15: 500 [IU]

## 2022-02-15 MED ORDER — SODIUM CHLORIDE 0.9 % IV SOLN
10.0000 mg | Freq: Once | INTRAVENOUS | Status: AC
  Administered 2022-02-15: 10 mg via INTRAVENOUS
  Filled 2022-02-15: qty 10

## 2022-02-15 MED ORDER — SODIUM CHLORIDE 0.9% FLUSH
10.0000 mL | INTRAVENOUS | Status: DC | PRN
  Administered 2022-02-15: 10 mL

## 2022-02-15 NOTE — Patient Instructions (Signed)
[facility]  Discharge Instructions: Thank you for choosing [facility] to provide your oncology and hematology care.   If you have a lab appointment with the Cancer Center, please go directly to the Cancer Center and check in at the registration area.   Wear comfortable clothing and clothing appropriate for easy access to any Portacath or PICC line.   We strive to give you quality time with your provider. You may need to reschedule your appointment if you arrive late (15 or more minutes).  Arriving late affects you and other patients whose appointments are after yours.  Also, if you miss three or more appointments without notifying the office, you may be dismissed from the clinic at the provider's discretion.      For prescription refill requests, have your pharmacy contact our office and allow 72 hours for refills to be completed.    Today you received the following chemotherapy and/or immunotherapy agents cosela, etoposide      To help prevent nausea and vomiting after your treatment, we encourage you to take your nausea medication as directed.  BELOW ARE SYMPTOMS THAT SHOULD BE REPORTED IMMEDIATELY: *FEVER GREATER THAN 100.4 F (38 C) OR HIGHER *CHILLS OR SWEATING *NAUSEA AND VOMITING THAT IS NOT CONTROLLED WITH YOUR NAUSEA MEDICATION *UNUSUAL SHORTNESS OF BREATH *UNUSUAL BRUISING OR BLEEDING *URINARY PROBLEMS (pain or burning when urinating, or frequent urination) *BOWEL PROBLEMS (unusual diarrhea, constipation, pain near the anus) TENDERNESS IN MOUTH AND THROAT WITH OR WITHOUT PRESENCE OF ULCERS (sore throat, sores in mouth, or a toothache) UNUSUAL RASH, SWELLING OR PAIN  UNUSUAL VAGINAL DISCHARGE OR ITCHING   Items with * indicate a potential emergency and should be followed up as soon as possible or go to the Emergency Department if any problems should occur.  Please show the CHEMOTHERAPY ALERT CARD or IMMUNOTHERAPY ALERT CARD at  check-in to the Emergency Department and triage nurse.  Should you have questions after your visit or need to cancel or reschedule your appointment, please contact [facility]  Dept: [phone]  and follow the prompts.  Office hours are 8:00 a.m. to 4:30 p.m. Monday - Friday. Please note that voicemails left after 4:00 p.m. may not be returned until the following business day.  We are closed weekends and major holidays. You have access to a nurse at all times for urgent questions. Please call the main number to the clinic Dept: [phone] and follow the prompts.   For any non-urgent questions, you may also contact your provider using MyChart. We now offer e-Visits for anyone 66 and older to request care online for non-urgent symptoms. For details visit [email or web address].   Also download the MyChart app! Go to the app store, search "MyChart", open the app, select , and log in with your MyChart username and password.

## 2022-02-16 ENCOUNTER — Other Ambulatory Visit: Payer: Self-pay | Admitting: Medical Oncology

## 2022-02-16 ENCOUNTER — Ambulatory Visit: Payer: Medicare Other

## 2022-02-16 ENCOUNTER — Inpatient Hospital Stay: Payer: Medicare Other

## 2022-02-16 VITALS — BP 111/68 | HR 87 | Temp 98.0°F | Resp 18

## 2022-02-16 DIAGNOSIS — Z5111 Encounter for antineoplastic chemotherapy: Secondary | ICD-10-CM

## 2022-02-16 DIAGNOSIS — C7951 Secondary malignant neoplasm of bone: Secondary | ICD-10-CM | POA: Diagnosis not present

## 2022-02-16 DIAGNOSIS — C7931 Secondary malignant neoplasm of brain: Secondary | ICD-10-CM | POA: Diagnosis not present

## 2022-02-16 DIAGNOSIS — C3492 Malignant neoplasm of unspecified part of left bronchus or lung: Secondary | ICD-10-CM

## 2022-02-16 DIAGNOSIS — C3432 Malignant neoplasm of lower lobe, left bronchus or lung: Secondary | ICD-10-CM | POA: Diagnosis not present

## 2022-02-16 MED ORDER — HEPARIN SOD (PORK) LOCK FLUSH 100 UNIT/ML IV SOLN
500.0000 [IU] | Freq: Once | INTRAVENOUS | Status: AC | PRN
  Administered 2022-02-16: 500 [IU]

## 2022-02-16 MED ORDER — SODIUM CHLORIDE 0.9 % IV SOLN
100.0000 mg/m2 | Freq: Once | INTRAVENOUS | Status: AC
  Administered 2022-02-16: 180 mg via INTRAVENOUS
  Filled 2022-02-16: qty 9

## 2022-02-16 MED ORDER — SODIUM CHLORIDE 0.9 % IV SOLN: Freq: Once | INTRAVENOUS | Status: AC

## 2022-02-16 MED ORDER — TRILACICLIB DIHYDROCHLORIDE INJECTION 300 MG
240.0000 mg/m2 | Freq: Once | INTRAVENOUS | Status: AC
  Administered 2022-02-16: 435 mg via INTRAVENOUS
  Filled 2022-02-16: qty 29

## 2022-02-16 MED ORDER — SODIUM CHLORIDE 0.9 % IV SOLN
10.0000 mg | Freq: Once | INTRAVENOUS | Status: AC
  Administered 2022-02-16: 10 mg via INTRAVENOUS
  Filled 2022-02-16: qty 10

## 2022-02-16 MED ORDER — SODIUM CHLORIDE 0.9% FLUSH
10.0000 mL | INTRAVENOUS | Status: DC | PRN
  Administered 2022-02-16: 10 mL

## 2022-02-16 NOTE — Patient Instructions (Signed)
[facility]  Discharge Instructions: Thank you for choosing [facility] to provide your oncology and hematology care.   If you have a lab appointment with the Cancer Center, please go directly to the Cancer Center and check in at the registration area.   Wear comfortable clothing and clothing appropriate for easy access to any Portacath or PICC line.   We strive to give you quality time with your provider. You may need to reschedule your appointment if you arrive late (15 or more minutes).  Arriving late affects you and other patients whose appointments are after yours.  Also, if you miss three or more appointments without notifying the office, you may be dismissed from the clinic at the provider's discretion.      For prescription refill requests, have your pharmacy contact our office and allow 72 hours for refills to be completed.    Today you received the following chemotherapy and/or immunotherapy agents: Cosela/Etoposide      To help prevent nausea and vomiting after your treatment, we encourage you to take your nausea medication as directed.  BELOW ARE SYMPTOMS THAT SHOULD BE REPORTED IMMEDIATELY: *FEVER GREATER THAN 100.4 F (38 C) OR HIGHER *CHILLS OR SWEATING *NAUSEA AND VOMITING THAT IS NOT CONTROLLED WITH YOUR NAUSEA MEDICATION *UNUSUAL SHORTNESS OF BREATH *UNUSUAL BRUISING OR BLEEDING *URINARY PROBLEMS (pain or burning when urinating, or frequent urination) *BOWEL PROBLEMS (unusual diarrhea, constipation, pain near the anus) TENDERNESS IN MOUTH AND THROAT WITH OR WITHOUT PRESENCE OF ULCERS (sore throat, sores in mouth, or a toothache) UNUSUAL RASH, SWELLING OR PAIN  UNUSUAL VAGINAL DISCHARGE OR ITCHING   Items with * indicate a potential emergency and should be followed up as soon as possible or go to the Emergency Department if any problems should occur.  Please show the CHEMOTHERAPY ALERT CARD or IMMUNOTHERAPY ALERT CARD at  check-in to the Emergency Department and triage nurse.  Should you have questions after your visit or need to cancel or reschedule your appointment, please contact [facility]  Dept: [phone]  and follow the prompts.  Office hours are 8:00 a.m. to 4:30 p.m. Monday - Friday. Please note that voicemails left after 4:00 p.m. may not be returned until the following business day.  We are closed weekends and major holidays. You have access to a nurse at all times for urgent questions. Please call the main number to the clinic Dept: [phone] and follow the prompts.   For any non-urgent questions, you may also contact your provider using MyChart. We now offer e-Visits for anyone 74 and older to request care online for non-urgent symptoms. For details visit [email or web address].   Also download the MyChart app! Go to the app store, search "MyChart", open the app, select [facility], and log in with your MyChart username and password.

## 2022-02-17 ENCOUNTER — Ambulatory Visit: Payer: Medicare Other

## 2022-02-17 ENCOUNTER — Telehealth: Payer: Self-pay

## 2022-02-17 NOTE — Telephone Encounter (Signed)
This nurse returned call to to Amy with [facility] and provided requested patient information for home health referral.  She informed this nurse that patient's referral has been accepted and he will be starting right away.  No further questions or concerns at this time.

## 2022-02-20 ENCOUNTER — Ambulatory Visit: Payer: Medicare Other

## 2022-02-20 NOTE — Progress Notes (Signed)
Palliative Medicine [facility]  [phone] [phone]   Name: Kelly Olson. Date: 02/20/2022 MRN: [id number]  DOB: 1950-07-04  Patient Care Team: Georgann Housekeeper, MD as PCP - General (Internal Medicine) Marykay Lex, MD as PCP - Cardiology (Cardiology)    REASON FOR CONSULTATION: Kelly Janczak Clayden Withem. is a 72 y.o. 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 He is s/p systemic chemotherapy and concurrent radiation. 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 Currently undergoing active chemotherapy. Palliative ask to see for symptom management and goals of care.    SOCIAL HISTORY:     reports that he quit smoking about 7 years ago. His smoking use included cigarettes. He has a 36.80 pack-year smoking history. He has never used smokeless tobacco. He reports current alcohol use of about 7.0 standard drinks of alcohol per week. He reports that he does not use drugs.  ADVANCE DIRECTIVES:  None on file  CODE STATUS: Full code  PAST MEDICAL HISTORY: Past Medical History:  Diagnosis Date   Coronary artery disease, occlusive 06/02/2014   Multivessel CAD.mLAD-100%, mRCA 99%, dRCA 100%, mLCX 90%  And EF 35-45%. -->  Referred for CABG; nonischemic Myoview March 2020   Former heavy tobacco smoker    Quit in April 2016    GERD (gastroesophageal reflux disease)    Hyperlipidemia with target LDL less than 70    Hypertension    Ischemic cardiomyopathy - resolved 05/2014   Myoview: EF ~33% with "infarction vs. severe resting ischemia in LAD & RCA territory; b) EF by Cath: 35-45%. 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   Stage T2 a, N0    PAST SURGICAL HISTORY:  Past Surgical History:  Procedure Laterality Date   CARDIAC CATHETERIZATION N/A 06/02/2014   Procedure: Left Heart Cath and Coronary Angiography;  Surgeon: Marykay Lex, MD;  Location: [facility] INVASIVE CV LAB CUPID;  Service: Cardiovascular;  mLAD-100%, mRCA 99%, dRCA 100%, mLCX 90%  And EF 35-45%.   CORONARY ARTERY BYPASS GRAFT N/A 06/04/2014   Procedure: CORONARY ARTERY BYPASS GRAFTING (CABG) times four using bilateral internal mammary arteries and EVH for left  leg saphenous vein;  Surgeon: Alleen Borne, MD;  Location: [facility] OR;  Service: Open Heart Surgery;  LIMA-OM, RIMA-LAD, SVG-Diag, SVG-rPDA   IR IMAGING GUIDED PORT INSERTION  05/16/2021   NM MYOVIEW LTD  05/28/2014   Pre-CABG:  High Risk Nuclear Stress Test with multivessel distribution ischemia.  -- Severely ischemic Cardiomyoapthy with evidence of at least 2 vessel disease & EF of ~33%.  Images are consistent with either infarction or severe resting ischemia in the LAD & RCA territory.   NM MYOVIEW LTD  03/2018   EF 50-55%.  Small basal inferior fixed defect consistent with diaphragmatic attenuation.  No ischemia or infarction.  Septal dyskinesis due to post CABG state   TEE WITHOUT CARDIOVERSION N/A 06/04/2014   Procedure: TRANSESOPHAGEAL ECHOCARDIOGRAM (TEE);  Surgeon: Alleen Borne, MD;  Location: [facility] OR;  Service: Open Heart Surgery;  Laterality: N/A;   THORACOTOMY  Right 07/10/2016   Procedure: THORACOTOMY;  Surgeon: Alleen Borne, MD;  Location: [facility] OR;  Service: Thoracic;  Laterality: Right;   THORACOTOMY/LOBECTOMY Right 07/29/2018   Procedure: THORACOTOMY/RIGHT LOWER LOBECTOMY;  Surgeon: Alleen Borne, MD;  Location: [facility] OR;  Service: Thoracic;  Laterality: Right;   TONSILLECTOMY  1957   TRANSTHORACIC ECHOCARDIOGRAM  08/10/2021   EF 60 to 65%.  GR 1 DD.  No RWMA.  Normal RV size and function.  Normal PAP.   Normal aortic and mitral valves.  Normal RAP.  Conclusion(s)/Recommendation(s): Normal biventricular function withoutevidence of hemodynamically significant valvular heart disease. . EF 50-55% with mild HK of basal anteroseptal myocardium. GR 1 DD.   VIDEO ASSISTED THORACOSCOPY (VATS)/WEDGE RESECTION Right 07/10/2016   Procedure: VIDEO ASSISTED THORACOSCOPY (VATS)/WEDGE RESECTION;  Surgeon: Alleen Borne, MD;  Location: [facility] OR;  Service: Thoracic;  Laterality: Right;   VIDEO BRONCHOSCOPY WITH ENDOBRONCHIAL NAVIGATION N/A 04/13/2021   Procedure: VIDEO BRONCHOSCOPY WITH ENDOBRONCHIAL NAVIGATION;  Surgeon: Loreli Slot, MD;  Location: [facility] OR;  Service: Thoracic;  Laterality: N/A;    HEMATOLOGY/ONCOLOGY HISTORY:  Oncology History  Primary small cell carcinoma of left lung (HCC)  04/27/2021 Initial Diagnosis   Primary small cell carcinoma of left lung (HCC)   04/27/2021 Cancer Staging   Staging form: Lung, AJCC 8th Edition - Clinical: Stage IVB (cT3, cN0, cM1c) - Signed by Si Gaul, MD on 01/16/2022   05/04/2021 - 07/09/2021 Chemotherapy   Patient is on Treatment Plan : LUNG SMALL CELL Carboplatin D1 / Etoposide D1-3 q21d     01/25/2022 -  Chemotherapy   Patient is on Treatment Plan : LUNG SMALL CELL EXTENSIVE STAGE Durvalumab + Carboplatin D1 + Etoposide D1-3 q21d x 4 Cycles / Durvalumab q28d       ALLERGIES:  has No Known Allergies.  MEDICATIONS:  Current Outpatient Medications  Medication Sig Dispense Refill   albuterol (VENTOLIN HFA) 108 (90 Base) MCG/ACT inhaler Inhale 2 puffs into the lungs every 6 (six) hours as needed for shortness of breath or wheezing.     aspirin 81 MG tablet Take 81 mg by mouth daily.     atorvastatin (LIPITOR) 40 MG tablet TAKE 1 TABLET AT BEDTIME (Patient taking differently: Take 40 mg by mouth at bedtime.) 90 tablet 3   carvedilol (COREG) 3.125 MG tablet TAKE 2 TABLETS TWICE A DAY WITH     MEALS (Patient taking differently: Take 6.25 mg by mouth  2 (two) times daily with a meal.) 360 tablet 2   losartan (COZAAR) 25 MG tablet Take 1 tablet (25 mg total) by mouth daily. 90 tablet 3   metFORMIN (GLUCOPHAGE-XR) 500 MG 24 hr tablet Take 500 mg by mouth 2 (two) times daily.     morphine (MS CONTIN) 30 MG 12 hr tablet Take 1 tablet (30 mg total) by mouth every 12 (twelve) hours. 60 tablet 0   morphine (MSIR) 15 MG tablet Take 1 tablet (15 mg total) by mouth every 6 (six) hours as needed for severe pain. 60 tablet 0   potassium chloride SA (KLOR-CON M) 20 MEQ tablet Take 1 tablet (20 mEq total) by mouth 2 (two) times daily. 20 tablet 0   Pseudoephedrine-APAP 30-325 MG TABS Take 1 tablet by mouth daily as needed (sinus congestion).     sildenafil (VIAGRA) 50 MG tablet Take 1 tablet (50 mg total) by mouth daily as needed for erectile dysfunction. 10 tablet 5   sucralfate (CARAFATE) 1 g tablet Take 1 g by mouth  4 (four) times daily.     No current facility-administered medications for this visit.    VITAL SIGNS: There were no vitals taken for this visit. There were no vitals filed for this visit.  Estimated body mass index is 19.48 kg/m as calculated from the following:   Height as of 01/26/22: 5\' 11"  (1.803 m).   Weight as of 02/14/22: 139 lb 11.2 oz (63.4 kg).  LABS: CBC:    Component Value Date/Time   WBC 9.6 02/14/2022 0800   WBC 10.0 01/29/2022 0214   HGB 10.2 (L) 02/14/2022 0800   HCT 30.1 (L) 02/14/2022 0800   PLT 94 (L) 02/14/2022 0800   MCV 100.0 02/14/2022 0800   NEUTROABS 8.2 (H) 02/14/2022 0800   LYMPHSABS 0.2 (L) 02/14/2022 0800   MONOABS 0.6 02/14/2022 0800   EOSABS 0.0 02/14/2022 0800   BASOSABS 0.0 02/14/2022 0800   Comprehensive Metabolic Panel:    Component Value Date/Time   NA 140 02/14/2022 0800   NA 142 04/08/2018 0858   K 3.4 (L) 02/14/2022 0800   CL 97 (L) 02/14/2022 0800   CO2 37 (H) 02/14/2022 0800   BUN 33 (H) 02/14/2022 0800   BUN 24 04/08/2018 0858   CREATININE 0.67 02/14/2022 0800   CREATININE  0.85 01/03/2016 0840   GLUCOSE 158 (H) 02/14/2022 0800   CALCIUM 9.0 02/14/2022 0800   AST 70 (H) 02/14/2022 0800   ALT 123 (H) 02/14/2022 0800   ALKPHOS 369 (H) 02/14/2022 0800   BILITOT 1.3 (H) 02/14/2022 0800   PROT 5.6 (L) 02/14/2022 0800   PROT 6.8 04/08/2018 0858   ALBUMIN 3.0 (L) 02/14/2022 0800   ALBUMIN 4.6 04/08/2018 0858    RADIOGRAPHIC STUDIES: CT Chest W Contrast  Result Date: 02/07/2022 CLINICAL DATA:  Small-cell lung cancer staging. * Tracking Code: BO * EXAM: CT CHEST WITH CONTRAST TECHNIQUE: Multidetector CT imaging of the chest was performed during intravenous contrast administration. RADIATION DOSE REDUCTION: This exam was performed according to the departmental dose-optimization program which includes automated exposure control, adjustment of the mA and/or kV according to patient size and/or use of iterative reconstruction technique. CONTRAST:  7mL OMNIPAQUE IOHEXOL 300 MG/ML  SOLN COMPARISON:  X-ray 01/18/2022. PET-CT 01/12/2022. CT 11/04/2021. Older exams as well. FINDINGS: Cardiovascular: Right upper chest port identified. Slightly atrophic appearing internal jugular vein. Status post median sternotomy. The heart is nonenlarged. No significant pericardial effusion. The thoracic aorta has a normal course and caliber with scattered atherosclerotic plaque. Minimal focal ectasia along the aortic arch. Bovine type aortic arch identified. Coronary artery calcifications are noted. Mediastinum/Nodes: There is no specific abnormal lymph node enlargement seen in the axillary region, hilum or mediastinum. Normal caliber thoracic esophagus. Slightly heterogeneous thyroid gland. Lungs/Pleura: Advanced emphysematous lung changes identified, centrilobular diffusely seen. There is a tiny left pleural effusion greater than right. Areas of interstitial septal thickening identified and scarring/fibrosis. Patient is status post right-sided lobectomy lower lobe. There appears to be some asymmetric  parenchymal opacity along the left lower lobe and dependent lingula. The lingular areas are increased from previous. Lower lobe is similar. Previously there were some small lung nodules. This includes a left lower lobe nodule measuring 8 x 9 mm on the prior and today this same nodule on image 108 of series 7 measures 10 x 12 mm. The more dependent areas in the left lower lobe towards the diaphragm are poorly seen with the increasing opacity which has been developing since the prior standard CT scan of October 2023. One  areas somewhat nodular in this location as measured on the prior PET-CT scan. On the PET-CT this was measured at 15 mm and today measured in similar fashion on series 7, image 114 measures 17 mm. Upper Abdomen: Along the upper abdomen the adrenal glands are diffusely thickened bilaterally. Please correlate for history. Hypertrophy is possible versus a lesion. This is significantly increased from previous. Fatty liver infiltration identified. Musculoskeletal: Mild degenerative changes seen along the spine. If there is concern of osseous metastatic disease, bone scan can be performed as clinically directed. Anasarca. IMPRESSION: Left lower lobe areas of nodularity appear to be slightly increasing from previous examinations. Some of these areas are partially obscured by the increasing parenchymal opacities in the lingula and left lower lobe. Extensive emphysematous lung changes as well as areas of scarring and fibrotic change. Tiny bilateral pleural effusions are identified. Left is similar to previous. The right is increasing. Chest port. No developing new lymph node enlargement. In the upper abdomen the adrenal glands today are diffusely thickened compared to the previous examinations and this is a new finding from October 2023 and December 2023. Please correlate with any known history or clinical evidence of adrenal abnormality. Otherwise additional workup with MRI with and without contrast may be of  benefit as the next step in the workup. Aortic Atherosclerosis (ICD10-I70.0) and Emphysema (ICD10-J43.9). Electronically Signed   By: Karen Kays M.D.   On: 02/07/2022 18:50    PERFORMANCE STATUS (ECOG) : 2 - Symptomatic, <50% confined to bed  Review of Systems  Constitutional:  Positive for activity change, appetite change, fatigue and unexpected weight change.  Musculoskeletal:  Positive for arthralgias and back pain.  Unless otherwise noted, a complete review of systems is negative.  Physical Exam General: NAD, in wheelchair  Cardiovascular: regular rate and rhythm Pulmonary: clear ant fields Abdomen: soft, nontender, + bowel sounds Extremities: lower extremity pitting edema, scratches noted, no joint deformities Skin: no rashes Neurological:AAO x3   IMPRESSION: This is my initial visit with Kelly Olson. No acute distress noted. His wife, Elnita Maxwell is also present. Patient is alert and able to engage appropriately in discussions.   I introduced myself, Maygan RN, and Palliative's role in collaboration with the oncology team. Concept of Palliative Care was introduced as specialized medical care for people and their families living with serious illness.  It focuses on providing relief from the symptoms and stress of a serious illness.  The goal is to improve quality of life for both the patient and the family. Values and goals of care important to patient and family were attempted to be elicited.   Kelly Olson lives in the home with his wife of more than 45 years. They have one son. 2 dogs in the home. He worked in the [employer] Prior to his illness he enjoyed golfing and softball.   In the home patient requires some assistance with ADLs. Endorses fatigue. Ambulatory with cane or walker. He sleeps in lift recliner which he purchased after his heart surgery in 2016. He is on 2-3L oxygen via nasal cannula. Is interested in obtaining portable oxygen concentrator. We will plan  to order.   Neoplasm related pain Kelly Olson endorses ongoing weakness and lower back pain that radiates around. He rates his pain 9/10 at it's worst. Pain is worst with activity and certain movements.   He was started on MS IR  15 mg as needed for breakthrough pain and MS Contin 30 mg every 12 hours. We  discussed use of both medications and their efficacy in regards to his pain. He and wife shares they have not started the MS Contin out of concern. The pharmacy initially felt he should take and see how he did prior to filling full prescription. Patient shares he has only taken the MS IR which is providing much improvement to his pain. He is taking medication in later evening/night hours. Has not required consistently throughout the day. He would like to continue with MS IR and hold off on MS Contin if pain is not well controlled in the future.   No changes to current MS IR dose. We will continue to closely monitor and support.    Constipation Patient has occasional constipation. Education provided on bowel management in the setting of opioid use. We discussed use of daily Miralax.   Decreased appetite/weight loss/dysphagia  Kelly Olson shares decrease in his appetite. He was unable to stand for weight on today but endorse weight loss. He shares foods do not taste the same. Also with some dry mouth. He is drinking at least one ensure daily and enjoys drinking Bubbly drinks.   Kelly Olson's last documented weight was 139lbs on 1/16 down from 146lbs on 12/27, and 163lbs on 10/10. Extensive education provided on nutrition. He is scheduled to see the Dietician today also. Education provided on use of Biotene for dry mouth, sugarless candy, and focusing on small frequent meals versus large meals.   Patient and wife understands the medical team will continue to closely monitor.   I discussed the importance of continued conversation with family and their medical providers regarding overall plan of care and  treatment options, ensuring decisions are within the context of the patients values and GOCs.  PLAN: Established therapeutic relationship. Education provided on palliative's role in collaboration with their Oncology/Radiation team. MS IR 15 mg every 4-6 hours as needed for pain.  Patient request to hold on MS Contin as pain is controlled with breakthrough medications. Is open to taking in the future pending symptoms.  Extensive education on nutrition, dry mouth support, and skin care. Recommended use of aquaphor or Cetaphil on lower extremities due to dryness. Biotene items for dry mouth. Small frequent meals vs focusing on large meals. He is scheduled to see Dietician today also.  Miralax daily for bowel regimen PT/OT support in the home (have been ordered per Dr. Arbutus Ped) Portable oxygen concentrator for support I will plan to see patient back in 2-3 weeks in collaboration to other oncology appointments. Sooner if needed.    Patient expressed understanding and was in agreement with this plan. He also understands that He can call the clinic at any time with any questions, concerns, or complaints.   Thank you for your referral and allowing Palliative to assist in Mr. Miron Marxen Jr.'s care.   Number and complexity of problems addressed: HIGH - 1 or more chronic illnesses with SEVERE exacerbation, progression, or side effects of treatment - advanced cancer, pain. Any controlled substances utilized were prescribed in the context of palliative care.  Time Total: 55 min   Visit consisted of counseling and education dealing with the complex and emotionally intense issues of symptom management and palliative care in the setting of serious and potentially life-threatening illness.Greater than 50%  of this time was spent counseling and coordinating care related to the above assessment and plan.  Signed by: Willette Alma, AGPCNP-BC Palliative Medicine Team/[facility]

## 2022-02-21 ENCOUNTER — Encounter: Payer: Self-pay | Admitting: Nurse Practitioner

## 2022-02-21 ENCOUNTER — Telehealth: Payer: Self-pay | Admitting: Medical Oncology

## 2022-02-21 ENCOUNTER — Inpatient Hospital Stay: Payer: Medicare Other

## 2022-02-21 ENCOUNTER — Inpatient Hospital Stay: Payer: Medicare Other | Admitting: Dietician

## 2022-02-21 ENCOUNTER — Other Ambulatory Visit: Payer: Self-pay

## 2022-02-21 ENCOUNTER — Other Ambulatory Visit: Payer: Self-pay | Admitting: Internal Medicine

## 2022-02-21 ENCOUNTER — Inpatient Hospital Stay (HOSPITAL_BASED_OUTPATIENT_CLINIC_OR_DEPARTMENT_OTHER): Payer: Medicare Other | Admitting: Nurse Practitioner

## 2022-02-21 ENCOUNTER — Ambulatory Visit: Payer: Medicare Other

## 2022-02-21 VITALS — BP 94/68 | HR 117 | Temp 97.8°F | Resp 16

## 2022-02-21 DIAGNOSIS — R7989 Other specified abnormal findings of blood chemistry: Secondary | ICD-10-CM | POA: Diagnosis not present

## 2022-02-21 DIAGNOSIS — R9431 Abnormal electrocardiogram [ECG] [EKG]: Secondary | ICD-10-CM | POA: Diagnosis not present

## 2022-02-21 DIAGNOSIS — R239 Unspecified skin changes: Secondary | ICD-10-CM | POA: Diagnosis not present

## 2022-02-21 DIAGNOSIS — T80211D Bloodstream infection due to central venous catheter, subsequent encounter: Secondary | ICD-10-CM | POA: Diagnosis not present

## 2022-02-21 DIAGNOSIS — C3492 Malignant neoplasm of unspecified part of left bronchus or lung: Secondary | ICD-10-CM

## 2022-02-21 DIAGNOSIS — D701 Agranulocytosis secondary to cancer chemotherapy: Secondary | ICD-10-CM | POA: Diagnosis present

## 2022-02-21 DIAGNOSIS — E1165 Type 2 diabetes mellitus with hyperglycemia: Secondary | ICD-10-CM | POA: Diagnosis not present

## 2022-02-21 DIAGNOSIS — C383 Malignant neoplasm of mediastinum, part unspecified: Secondary | ICD-10-CM | POA: Diagnosis not present

## 2022-02-21 DIAGNOSIS — R0902 Hypoxemia: Secondary | ICD-10-CM | POA: Diagnosis not present

## 2022-02-21 DIAGNOSIS — C7931 Secondary malignant neoplasm of brain: Secondary | ICD-10-CM | POA: Diagnosis not present

## 2022-02-21 DIAGNOSIS — J918 Pleural effusion in other conditions classified elsewhere: Secondary | ICD-10-CM | POA: Diagnosis present

## 2022-02-21 DIAGNOSIS — R6520 Severe sepsis without septic shock: Secondary | ICD-10-CM | POA: Diagnosis present

## 2022-02-21 DIAGNOSIS — R739 Hyperglycemia, unspecified: Secondary | ICD-10-CM | POA: Diagnosis not present

## 2022-02-21 DIAGNOSIS — I110 Hypertensive heart disease with heart failure: Secondary | ICD-10-CM | POA: Diagnosis present

## 2022-02-21 DIAGNOSIS — J168 Pneumonia due to other specified infectious organisms: Secondary | ICD-10-CM | POA: Diagnosis not present

## 2022-02-21 DIAGNOSIS — J948 Other specified pleural conditions: Secondary | ICD-10-CM | POA: Diagnosis not present

## 2022-02-21 DIAGNOSIS — Z515 Encounter for palliative care: Secondary | ICD-10-CM | POA: Diagnosis not present

## 2022-02-21 DIAGNOSIS — R7881 Bacteremia: Secondary | ICD-10-CM | POA: Diagnosis not present

## 2022-02-21 DIAGNOSIS — J9690 Respiratory failure, unspecified, unspecified whether with hypoxia or hypercapnia: Secondary | ICD-10-CM | POA: Diagnosis not present

## 2022-02-21 DIAGNOSIS — K5903 Drug induced constipation: Secondary | ICD-10-CM

## 2022-02-21 DIAGNOSIS — Z95828 Presence of other vascular implants and grafts: Secondary | ICD-10-CM

## 2022-02-21 DIAGNOSIS — A419 Sepsis, unspecified organism: Secondary | ICD-10-CM | POA: Diagnosis not present

## 2022-02-21 DIAGNOSIS — R634 Abnormal weight loss: Secondary | ICD-10-CM

## 2022-02-21 DIAGNOSIS — Z7189 Other specified counseling: Secondary | ICD-10-CM | POA: Diagnosis not present

## 2022-02-21 DIAGNOSIS — C3490 Malignant neoplasm of unspecified part of unspecified bronchus or lung: Secondary | ICD-10-CM | POA: Diagnosis not present

## 2022-02-21 DIAGNOSIS — D61818 Other pancytopenia: Secondary | ICD-10-CM | POA: Diagnosis not present

## 2022-02-21 DIAGNOSIS — J440 Chronic obstructive pulmonary disease with acute lower respiratory infection: Secondary | ICD-10-CM | POA: Diagnosis present

## 2022-02-21 DIAGNOSIS — J811 Chronic pulmonary edema: Secondary | ICD-10-CM | POA: Diagnosis not present

## 2022-02-21 DIAGNOSIS — T80211A Bloodstream infection due to central venous catheter, initial encounter: Secondary | ICD-10-CM | POA: Diagnosis present

## 2022-02-21 DIAGNOSIS — C3491 Malignant neoplasm of unspecified part of right bronchus or lung: Secondary | ICD-10-CM | POA: Diagnosis present

## 2022-02-21 DIAGNOSIS — L8995 Pressure ulcer of unspecified site, unstageable: Secondary | ICD-10-CM | POA: Diagnosis not present

## 2022-02-21 DIAGNOSIS — C787 Secondary malignant neoplasm of liver and intrahepatic bile duct: Secondary | ICD-10-CM | POA: Diagnosis present

## 2022-02-21 DIAGNOSIS — E876 Hypokalemia: Secondary | ICD-10-CM | POA: Diagnosis not present

## 2022-02-21 DIAGNOSIS — C7951 Secondary malignant neoplasm of bone: Secondary | ICD-10-CM | POA: Diagnosis not present

## 2022-02-21 DIAGNOSIS — J189 Pneumonia, unspecified organism: Secondary | ICD-10-CM | POA: Diagnosis not present

## 2022-02-21 DIAGNOSIS — I5032 Chronic diastolic (congestive) heart failure: Secondary | ICD-10-CM | POA: Diagnosis present

## 2022-02-21 DIAGNOSIS — Z1152 Encounter for screening for COVID-19: Secondary | ICD-10-CM | POA: Diagnosis not present

## 2022-02-21 DIAGNOSIS — Y848 Other medical procedures as the cause of abnormal reaction of the patient, or of later complication, without mention of misadventure at the time of the procedure: Secondary | ICD-10-CM | POA: Diagnosis present

## 2022-02-21 DIAGNOSIS — R509 Fever, unspecified: Secondary | ICD-10-CM | POA: Diagnosis present

## 2022-02-21 DIAGNOSIS — R0600 Dyspnea, unspecified: Secondary | ICD-10-CM | POA: Diagnosis not present

## 2022-02-21 DIAGNOSIS — R630 Anorexia: Secondary | ICD-10-CM

## 2022-02-21 DIAGNOSIS — J9601 Acute respiratory failure with hypoxia: Secondary | ICD-10-CM | POA: Diagnosis not present

## 2022-02-21 DIAGNOSIS — B9620 Unspecified Escherichia coli [E. coli] as the cause of diseases classified elsewhere: Secondary | ICD-10-CM | POA: Diagnosis not present

## 2022-02-21 DIAGNOSIS — G893 Neoplasm related pain (acute) (chronic): Secondary | ICD-10-CM

## 2022-02-21 DIAGNOSIS — R530 Neoplastic (malignant) related fatigue: Secondary | ICD-10-CM | POA: Diagnosis not present

## 2022-02-21 DIAGNOSIS — J439 Emphysema, unspecified: Secondary | ICD-10-CM | POA: Diagnosis not present

## 2022-02-21 DIAGNOSIS — E43 Unspecified severe protein-calorie malnutrition: Secondary | ICD-10-CM | POA: Diagnosis present

## 2022-02-21 DIAGNOSIS — J90 Pleural effusion, not elsewhere classified: Secondary | ICD-10-CM | POA: Diagnosis not present

## 2022-02-21 DIAGNOSIS — Z1629 Resistance to other single specified antibiotic: Secondary | ICD-10-CM | POA: Diagnosis present

## 2022-02-21 DIAGNOSIS — R531 Weakness: Secondary | ICD-10-CM | POA: Diagnosis not present

## 2022-02-21 DIAGNOSIS — I2510 Atherosclerotic heart disease of native coronary artery without angina pectoris: Secondary | ICD-10-CM | POA: Diagnosis not present

## 2022-02-21 DIAGNOSIS — R059 Cough, unspecified: Secondary | ICD-10-CM | POA: Diagnosis not present

## 2022-02-21 DIAGNOSIS — A4151 Sepsis due to Escherichia coli [E. coli]: Secondary | ICD-10-CM | POA: Diagnosis present

## 2022-02-21 DIAGNOSIS — R64 Cachexia: Secondary | ICD-10-CM | POA: Diagnosis present

## 2022-02-21 DIAGNOSIS — D61810 Antineoplastic chemotherapy induced pancytopenia: Secondary | ICD-10-CM | POA: Diagnosis present

## 2022-02-21 DIAGNOSIS — J9621 Acute and chronic respiratory failure with hypoxia: Secondary | ICD-10-CM | POA: Diagnosis not present

## 2022-02-21 DIAGNOSIS — Z66 Do not resuscitate: Secondary | ICD-10-CM | POA: Diagnosis not present

## 2022-02-21 LAB — CBC WITH DIFFERENTIAL (CANCER CENTER ONLY)
Abs Immature Granulocytes: 0 10*3/uL (ref 0.00–0.07)
Band Neutrophils: 6 %
Basophils Absolute: 0 10*3/uL (ref 0.0–0.1)
Basophils Relative: 0 %
Eosinophils Absolute: 0 10*3/uL (ref 0.0–0.5)
Eosinophils Relative: 0 %
HCT: 26.9 % — ABNORMAL LOW (ref 39.0–52.0)
Hemoglobin: 8.9 g/dL — ABNORMAL LOW (ref 13.0–17.0)
Lymphocytes Relative: 8 %
Lymphs Abs: 0 10*3/uL — ABNORMAL LOW (ref 0.7–4.0)
MCH: 33.5 pg (ref 26.0–34.0)
MCHC: 33.1 g/dL (ref 30.0–36.0)
MCV: 101.1 fL — ABNORMAL HIGH (ref 80.0–100.0)
Metamyelocytes Relative: 2 %
Monocytes Absolute: 0 10*3/uL — ABNORMAL LOW (ref 0.1–1.0)
Monocytes Relative: 2 %
Myelocytes: 1 %
Neutro Abs: 0.4 10*3/uL — CL (ref 1.7–7.7)
Neutrophils Relative %: 81 %
Platelet Count: 34 10*3/uL — ABNORMAL LOW (ref 150–400)
RBC: 2.66 MIL/uL — ABNORMAL LOW (ref 4.22–5.81)
RDW: 14.2 % (ref 11.5–15.5)
WBC Count: 0.5 10*3/uL — CL (ref 4.0–10.5)
nRBC: 0 % (ref 0.0–0.2)

## 2022-02-21 LAB — CMP (CANCER CENTER ONLY)
ALT: 98 U/L — ABNORMAL HIGH (ref 0–44)
AST: 68 U/L — ABNORMAL HIGH (ref 15–41)
Albumin: 2.8 g/dL — ABNORMAL LOW (ref 3.5–5.0)
Alkaline Phosphatase: 400 U/L — ABNORMAL HIGH (ref 38–126)
Anion gap: 6 (ref 5–15)
BUN: 24 mg/dL — ABNORMAL HIGH (ref 8–23)
CO2: 43 mmol/L — ABNORMAL HIGH (ref 22–32)
Calcium: 7.9 mg/dL — ABNORMAL LOW (ref 8.9–10.3)
Chloride: 97 mmol/L — ABNORMAL LOW (ref 98–111)
Creatinine: 0.65 mg/dL (ref 0.61–1.24)
GFR, Estimated: >60 mL/min (ref >60)
Glucose, Bld: 244 mg/dL — ABNORMAL HIGH (ref 70–99)
Potassium: 3 mmol/L — ABNORMAL LOW (ref 3.5–5.1)
Sodium: 146 mmol/L — ABNORMAL HIGH (ref 135–145)
Total Bilirubin: 1.1 mg/dL (ref 0.3–1.2)
Total Protein: 5 g/dL — ABNORMAL LOW (ref 6.5–8.1)

## 2022-02-21 MED ORDER — TBO-FILGRASTIM 300 MCG/0.5ML ~~LOC~~ SOSY
300.0000 ug | PREFILLED_SYRINGE | Freq: Every day | SUBCUTANEOUS | Status: DC
  Filled 2022-02-21: qty 0.5

## 2022-02-21 MED ORDER — SODIUM CHLORIDE 0.9% FLUSH
10.0000 mL | Freq: Once | INTRAVENOUS | Status: AC
  Administered 2022-02-21: 10 mL

## 2022-02-21 MED ORDER — HEPARIN SOD (PORK) LOCK FLUSH 100 UNIT/ML IV SOLN
500.0000 [IU] | Freq: Once | INTRAVENOUS | Status: AC
  Administered 2022-02-21: 500 [IU]

## 2022-02-21 MED ORDER — FILGRASTIM-SNDZ 300 MCG/0.5ML IJ SOSY
300.0000 ug | PREFILLED_SYRINGE | Freq: Once | INTRAMUSCULAR | Status: AC
  Administered 2022-02-21: 300 ug via SUBCUTANEOUS
  Filled 2022-02-21: qty 0.5

## 2022-02-21 NOTE — Progress Notes (Signed)
Nutrition Assessment   Reason for Assessment: MD referral (wt loss)   ASSESSMENT: 72 year old male with recurrent lung cancer metastatic to bone and brain. He has completed concurrent chemoradiation followed by cranial irradiation under the care of Dr. Kathrynn Running. He is currently receiving durvalumab + carboplatin + etoposide q21d. Patient is under the care of Dr. Arbutus Ped.   Past medical history includes CAD s/p CABG, HTN, COPD  Met with patient and wife in office. Patient arrives in wheelchair. He is on supplemental oxygen. Patient reports appetite is okay. He is limited on what he can eat secondary to dysphagia s/p radiation. Patient recalls tolerating soft smooth foods (yogurt, banana, spaghetti, fish, lima beans, ice cream). He is drinking one Ensure. Patient denies nausea, vomiting, diarrhea, constipation.   Nutrition Focused Physical Exam:   Orbital Region: severe Buccal Region: severe Upper Arm Region: Air cabin crew and Lumbar Region: Lucianne Lei Region: severe Clavicle Bone Region: uta Shoulder and Acromion Bone Region: severe Scapular Bone Region: severe Dorsal Hand: moderate Patellar Region: uta Anterior Thigh Region: uta Posterior Calf Region: severe Edema (RD assessment): +1 bilateral foot  Hair: reviewed (dry scalp) Eyes: reviewed  Mouth: uta Skin: uta Nails: uta   Medications: coreg, cozaar, metformin, morphine, klor-con, carafate, lipitor, MSIR   Labs: Na 146, K 3.0, glucose 244, BUN 24, albumin 2.8, Hgb 8.9   Anthropometrics: Weights have decreased ~15% (24 lbs) from usual wt in 3 months - this is significant   Height: 5'11" Weight: 139 lb 11.2 oz UBW: 163 lb 1.6 oz (11/08/21) BMI: 19.48   NUTRITION DIAGNOSIS: Unintended weight loss related to metastatic cancer as evidenced by 15% weight loss in 3 months    MALNUTRITION DIAGNOSIS: Patient meets criteria for severe malnutrition in the context of chronic illness as evidenced by moderate/severe fat and  muscle depletion, 15% wt loss in 3 months   INTERVENTION:  Encouraged small frequent meals and snacks with adequate calories and protein - shake recipes + snack ideas given Discussed soft moist high protein foods for ease of intake - handout with ideas provided  Continue drinking Ensure Plus/equivalent - recommend 2-3 day One complimentary case Ensure Complete provided  Contact information provided    MONITORING, EVALUATION, GOAL: Patient will tolerate increased calories and protein to promote wt gain   Next Visit: Tuesday February 27 during infusion

## 2022-02-21 NOTE — Patient Instructions (Addendum)
-  stop taking the MS Contin (morphine 30mg , long acting)  - continue to take the MSIR (morphine 15mg  short acting) every 6 hours as needed for your pain - try to take biotene for dry mouth, or hard candy to help  - try to suck on a lemon or sour sugarless candy before a meal to "wake up" your taste buds - reach out 2-3 days prior to running out of medication for a refill - continue your Carafate before every meal and bedtime to help with swallowing

## 2022-02-21 NOTE — Telephone Encounter (Signed)
CRITICAL VALUE STICKER  CRITICAL VALUE: ANC-0.4  RECEIVER (on-site recipient of call):Damarian Priola  DATE & TIME NOTIFIED: 01/23 @ 1 pm  MESSENGER (representative from lab): Leta Jungling MD NOTIFIED: Arbutus Ped  TIME OF NOTIFICATION:1330  RESPONSE:  gcsf ordered x 3 days Pt notified.

## 2022-02-22 ENCOUNTER — Other Ambulatory Visit: Payer: Self-pay | Admitting: Medical Oncology

## 2022-02-22 ENCOUNTER — Inpatient Hospital Stay: Payer: Medicare Other

## 2022-02-22 ENCOUNTER — Other Ambulatory Visit: Payer: Self-pay | Admitting: Internal Medicine

## 2022-02-22 VITALS — BP 97/62 | HR 98 | Temp 97.7°F | Resp 18

## 2022-02-22 DIAGNOSIS — Z95828 Presence of other vascular implants and grafts: Secondary | ICD-10-CM

## 2022-02-22 DIAGNOSIS — C3492 Malignant neoplasm of unspecified part of left bronchus or lung: Secondary | ICD-10-CM

## 2022-02-22 MED ORDER — FILGRASTIM-SNDZ 300 MCG/0.5ML IJ SOSY
300.0000 ug | PREFILLED_SYRINGE | Freq: Once | INTRAMUSCULAR | Status: AC
  Administered 2022-02-22: 300 ug via SUBCUTANEOUS
  Filled 2022-02-22: qty 0.5

## 2022-02-22 MED ORDER — MIRTAZAPINE 30 MG PO TABS: 30.0000 mg | ORAL_TABLET | Freq: Every day | ORAL | 2 refills | Status: AC

## 2022-02-22 NOTE — Patient Instructions (Signed)
Filgrastim Injection What is this medication? FILGRASTIM (fil GRA stim) lowers the risk of infection in people who are receiving chemotherapy. It works by Building control surveyor make more white blood cells, which protects your body from infection. It may also be used to help people who have been exposed to high doses of radiation. It can be used to help prepare your body before a stem cell transplant. It works by helping your bone marrow make and release stem cells into the blood. This medicine may be used for other purposes; ask your health care provider or pharmacist if you have questions. COMMON BRAND NAME(S): Neupogen, Nivestym, Releuko, Zarxio What should I tell my care team before I take this medication? They need to know if you have any of these conditions: History of blood diseases, such as sickle cell anemia Kidney disease Recent or ongoing radiation An unusual or allergic reaction to filgrastim, pegfilgrastim, latex, rubber, other medications, foods, dyes, or preservatives Pregnant or trying to get pregnant Breast-feeding How should I use this medication? This medication is injected under the skin or into a vein. It is usually given by your care team in a hospital or clinic setting. It may be given at home. If you get this medication at home, you will be taught how to prepare and give it. Use exactly as directed. Take it as directed on the prescription label at the same time every day. Keep taking it unless your care team tells you to stop. It is important that you put your used needles and syringes in a special sharps container. Do not put them in a trash can. If you do not have a sharps container, call your pharmacist or care team to get one. This medication comes with INSTRUCTIONS FOR USE. Ask your pharmacist for directions on how to use this medication. Read the information carefully. Talk to your pharmacist or care team if you have questions. Talk to your care team about the use of this  medication in children. While it may be prescribed for children for selected conditions, precautions do apply. Overdosage: If you think you have taken too much of this medicine contact a poison control center or emergency room at once. NOTE: This medicine is only for you. Do not share this medicine with others. What if I miss a dose? It is important not to miss any doses. Talk to your care team about what to do if you miss a dose. What may interact with this medication? Medications that may cause a release of neutrophils, such as lithium This list may not describe all possible interactions. Give your health care provider a list of all the medicines, herbs, non-prescription drugs, or dietary supplements you use. Also tell them if you smoke, drink alcohol, or use illegal drugs. Some items may interact with your medicine. What should I watch for while using this medication? Your condition will be monitored carefully while you are receiving this medication. You may need bloodwork while taking this medication. Talk to your care team about your risk of cancer. You may be more at risk for certain types of cancer if you take this medication. What side effects may I notice from receiving this medication? Side effects that you should report to your care team as soon as possible: Allergic reactions--skin rash, itching, hives, swelling of the face, lips, tongue, or throat Capillary leak syndrome--stomach or muscle pain, unusual weakness or fatigue, feeling faint or lightheaded, decrease in the amount of urine, swelling of the ankles, hands, or  feet, trouble breathing High white blood cell level--fever, fatigue, trouble breathing, night sweats, change in vision, weight loss Inflammation of the aorta--fever, fatigue, back, chest, or stomach pain, severe headache Kidney injury (glomerulonephritis)--decrease in the amount of urine, red or dark brown urine, foamy or bubbly urine, swelling of the ankles, hands, or  feet Shortness of breath or trouble breathing Spleen injury--pain in upper left stomach or shoulder Unusual bruising or bleeding Side effects that usually do not require medical attention (report to your care team if they continue or are bothersome): Back pain Bone pain Fatigue Fever Headache Nausea This list may not describe all possible side effects. Call your doctor for medical advice about side effects. You may report side effects to FDA at 1-800-FDA-1088. Where should I keep my medication? Keep out of the reach of children and pets. Keep this medication in the original packaging until you are ready to take it. Protect from light. See product for storage information. Each product may have different instructions. Get rid of any unused medication after the expiration date. To get rid of medications that are no longer needed or have expired: Take the medication to a medications take-back program. Check with your pharmacy or law enforcement to find a location. If you cannot return the medication, ask your pharmacist or care team how to get rid of this medication safely. NOTE: This sheet is a summary. It may not cover all possible information. If you have questions about this medicine, talk to your doctor, pharmacist, or health care provider.  2023 Elsevier/Gold Standard (2021-04-26 00:00:00)

## 2022-02-23 ENCOUNTER — Ambulatory Visit (INDEPENDENT_AMBULATORY_CARE_PROVIDER_SITE_OTHER): Payer: Medicare Other | Admitting: Pulmonary Disease

## 2022-02-23 ENCOUNTER — Inpatient Hospital Stay: Payer: Medicare Other

## 2022-02-23 ENCOUNTER — Other Ambulatory Visit: Payer: Self-pay

## 2022-02-23 VITALS — BP 113/65 | HR 110 | Temp 98.2°F | Resp 18

## 2022-02-23 DIAGNOSIS — C3492 Malignant neoplasm of unspecified part of left bronchus or lung: Secondary | ICD-10-CM

## 2022-02-23 DIAGNOSIS — Z95828 Presence of other vascular implants and grafts: Secondary | ICD-10-CM

## 2022-02-23 MED ORDER — FILGRASTIM-SNDZ 300 MCG/0.5ML IJ SOSY
300.0000 ug | PREFILLED_SYRINGE | Freq: Once | INTRAMUSCULAR | Status: AC
  Administered 2022-02-23: 300 ug via SUBCUTANEOUS
  Filled 2022-02-23: qty 0.5

## 2022-02-23 NOTE — Patient Instructions (Signed)
Filgrastim Injection What is this medication? FILGRASTIM (fil GRA stim) lowers the risk of infection in people who are receiving chemotherapy. It works by Building control surveyor make more white blood cells, which protects your body from infection. It may also be used to help people who have been exposed to high doses of radiation. It can be used to help prepare your body before a stem cell transplant. It works by helping your bone marrow make and release stem cells into the blood. This medicine may be used for other purposes; ask your health care provider or pharmacist if you have questions. COMMON BRAND NAME(S): Neupogen, Nivestym, Releuko, Zarxio What should I tell my care team before I take this medication? They need to know if you have any of these conditions: History of blood diseases, such as sickle cell anemia Kidney disease Recent or ongoing radiation An unusual or allergic reaction to filgrastim, pegfilgrastim, latex, rubber, other medications, foods, dyes, or preservatives Pregnant or trying to get pregnant Breast-feeding How should I use this medication? This medication is injected under the skin or into a vein. It is usually given by your care team in a hospital or clinic setting. It may be given at home. If you get this medication at home, you will be taught how to prepare and give it. Use exactly as directed. Take it as directed on the prescription label at the same time every day. Keep taking it unless your care team tells you to stop. It is important that you put your used needles and syringes in a special sharps container. Do not put them in a trash can. If you do not have a sharps container, call your pharmacist or care team to get one. This medication comes with INSTRUCTIONS FOR USE. Ask your pharmacist for directions on how to use this medication. Read the information carefully. Talk to your pharmacist or care team if you have questions. Talk to your care team about the use of this  medication in children. While it may be prescribed for children for selected conditions, precautions do apply. Overdosage: If you think you have taken too much of this medicine contact a poison control center or emergency room at once. NOTE: This medicine is only for you. Do not share this medicine with others. What if I miss a dose? It is important not to miss any doses. Talk to your care team about what to do if you miss a dose. What may interact with this medication? Medications that may cause a release of neutrophils, such as lithium This list may not describe all possible interactions. Give your health care provider a list of all the medicines, herbs, non-prescription drugs, or dietary supplements you use. Also tell them if you smoke, drink alcohol, or use illegal drugs. Some items may interact with your medicine. What should I watch for while using this medication? Your condition will be monitored carefully while you are receiving this medication. You may need bloodwork while taking this medication. Talk to your care team about your risk of cancer. You may be more at risk for certain types of cancer if you take this medication. What side effects may I notice from receiving this medication? Side effects that you should report to your care team as soon as possible: Allergic reactions--skin rash, itching, hives, swelling of the face, lips, tongue, or throat Capillary leak syndrome--stomach or muscle pain, unusual weakness or fatigue, feeling faint or lightheaded, decrease in the amount of urine, swelling of the ankles, hands, or  feet, trouble breathing High white blood cell level--fever, fatigue, trouble breathing, night sweats, change in vision, weight loss Inflammation of the aorta--fever, fatigue, back, chest, or stomach pain, severe headache Kidney injury (glomerulonephritis)--decrease in the amount of urine, red or dark brown urine, foamy or bubbly urine, swelling of the ankles, hands, or  feet Shortness of breath or trouble breathing Spleen injury--pain in upper left stomach or shoulder Unusual bruising or bleeding Side effects that usually do not require medical attention (report to your care team if they continue or are bothersome): Back pain Bone pain Fatigue Fever Headache Nausea This list may not describe all possible side effects. Call your doctor for medical advice about side effects. You may report side effects to FDA at 1-800-FDA-1088. Where should I keep my medication? Keep out of the reach of children and pets. Keep this medication in the original packaging until you are ready to take it. Protect from light. See product for storage information. Each product may have different instructions. Get rid of any unused medication after the expiration date. To get rid of medications that are no longer needed or have expired: Take the medication to a medications take-back program. Check with your pharmacy or law enforcement to find a location. If you cannot return the medication, ask your pharmacist or care team how to get rid of this medication safely. NOTE: This sheet is a summary. It may not cover all possible information. If you have questions about this medicine, talk to your doctor, pharmacist, or health care provider.  2023 Elsevier/Gold Standard (2021-04-26 00:00:00)

## 2022-02-23 NOTE — Progress Notes (Signed)
error 

## 2022-02-24 ENCOUNTER — Other Ambulatory Visit: Payer: Self-pay

## 2022-02-24 ENCOUNTER — Encounter (HOSPITAL_COMMUNITY): Payer: Self-pay

## 2022-02-24 ENCOUNTER — Inpatient Hospital Stay: Payer: Medicare Other | Admitting: Nurse Practitioner

## 2022-02-24 ENCOUNTER — Telehealth: Payer: Self-pay | Admitting: Physician Assistant

## 2022-02-24 ENCOUNTER — Other Ambulatory Visit: Payer: Self-pay | Admitting: Radiation Therapy

## 2022-02-24 ENCOUNTER — Emergency Department (HOSPITAL_COMMUNITY): Payer: Medicare Other

## 2022-02-24 ENCOUNTER — Telehealth: Payer: Self-pay

## 2022-02-24 ENCOUNTER — Inpatient Hospital Stay (HOSPITAL_COMMUNITY)
Admission: EM | Admit: 2022-02-24 | Discharge: 2022-03-31 | DRG: 314 | Disposition: E | Payer: Medicare Other | Attending: Internal Medicine | Admitting: Internal Medicine

## 2022-02-24 DIAGNOSIS — Z87891 Personal history of nicotine dependence: Secondary | ICD-10-CM

## 2022-02-24 DIAGNOSIS — C787 Secondary malignant neoplasm of liver and intrahepatic bile duct: Secondary | ICD-10-CM | POA: Diagnosis present

## 2022-02-24 DIAGNOSIS — Z79899 Other long term (current) drug therapy: Secondary | ICD-10-CM

## 2022-02-24 DIAGNOSIS — A4151 Sepsis due to Escherichia coli [E. coli]: Secondary | ICD-10-CM | POA: Diagnosis present

## 2022-02-24 DIAGNOSIS — Y848 Other medical procedures as the cause of abnormal reaction of the patient, or of later complication, without mention of misadventure at the time of the procedure: Secondary | ICD-10-CM | POA: Diagnosis present

## 2022-02-24 DIAGNOSIS — Z1629 Resistance to other single specified antibiotic: Secondary | ICD-10-CM | POA: Diagnosis present

## 2022-02-24 DIAGNOSIS — R509 Fever, unspecified: Secondary | ICD-10-CM | POA: Diagnosis present

## 2022-02-24 DIAGNOSIS — E785 Hyperlipidemia, unspecified: Secondary | ICD-10-CM | POA: Diagnosis present

## 2022-02-24 DIAGNOSIS — E43 Unspecified severe protein-calorie malnutrition: Secondary | ICD-10-CM | POA: Diagnosis present

## 2022-02-24 DIAGNOSIS — R531 Weakness: Secondary | ICD-10-CM | POA: Diagnosis not present

## 2022-02-24 DIAGNOSIS — G8929 Other chronic pain: Secondary | ICD-10-CM | POA: Diagnosis present

## 2022-02-24 DIAGNOSIS — J440 Chronic obstructive pulmonary disease with acute lower respiratory infection: Secondary | ICD-10-CM | POA: Diagnosis present

## 2022-02-24 DIAGNOSIS — T80211A Bloodstream infection due to central venous catheter, initial encounter: Principal | ICD-10-CM

## 2022-02-24 DIAGNOSIS — A419 Sepsis, unspecified organism: Secondary | ICD-10-CM | POA: Diagnosis not present

## 2022-02-24 DIAGNOSIS — E876 Hypokalemia: Secondary | ICD-10-CM | POA: Diagnosis not present

## 2022-02-24 DIAGNOSIS — R7881 Bacteremia: Secondary | ICD-10-CM | POA: Diagnosis present

## 2022-02-24 DIAGNOSIS — Z8249 Family history of ischemic heart disease and other diseases of the circulatory system: Secondary | ICD-10-CM

## 2022-02-24 DIAGNOSIS — C3490 Malignant neoplasm of unspecified part of unspecified bronchus or lung: Secondary | ICD-10-CM | POA: Diagnosis not present

## 2022-02-24 DIAGNOSIS — Z1152 Encounter for screening for COVID-19: Secondary | ICD-10-CM

## 2022-02-24 DIAGNOSIS — R1310 Dysphagia, unspecified: Secondary | ICD-10-CM | POA: Diagnosis present

## 2022-02-24 DIAGNOSIS — J90 Pleural effusion, not elsewhere classified: Secondary | ICD-10-CM | POA: Diagnosis not present

## 2022-02-24 DIAGNOSIS — C3491 Malignant neoplasm of unspecified part of right bronchus or lung: Secondary | ICD-10-CM | POA: Diagnosis present

## 2022-02-24 DIAGNOSIS — C7931 Secondary malignant neoplasm of brain: Secondary | ICD-10-CM

## 2022-02-24 DIAGNOSIS — Z681 Body mass index (BMI) 19 or less, adult: Secondary | ICD-10-CM

## 2022-02-24 DIAGNOSIS — J918 Pleural effusion in other conditions classified elsewhere: Secondary | ICD-10-CM | POA: Diagnosis present

## 2022-02-24 DIAGNOSIS — R9431 Abnormal electrocardiogram [ECG] [EKG]: Secondary | ICD-10-CM | POA: Diagnosis present

## 2022-02-24 DIAGNOSIS — C7951 Secondary malignant neoplasm of bone: Secondary | ICD-10-CM

## 2022-02-24 DIAGNOSIS — R5081 Fever presenting with conditions classified elsewhere: Secondary | ICD-10-CM | POA: Diagnosis present

## 2022-02-24 DIAGNOSIS — R0600 Dyspnea, unspecified: Secondary | ICD-10-CM | POA: Diagnosis not present

## 2022-02-24 DIAGNOSIS — I110 Hypertensive heart disease with heart failure: Secondary | ICD-10-CM | POA: Diagnosis present

## 2022-02-24 DIAGNOSIS — T451X5A Adverse effect of antineoplastic and immunosuppressive drugs, initial encounter: Secondary | ICD-10-CM | POA: Diagnosis present

## 2022-02-24 DIAGNOSIS — G893 Neoplasm related pain (acute) (chronic): Secondary | ICD-10-CM

## 2022-02-24 DIAGNOSIS — R6520 Severe sepsis without septic shock: Secondary | ICD-10-CM | POA: Diagnosis present

## 2022-02-24 DIAGNOSIS — T80211D Bloodstream infection due to central venous catheter, subsequent encounter: Secondary | ICD-10-CM | POA: Diagnosis not present

## 2022-02-24 DIAGNOSIS — R54 Age-related physical debility: Secondary | ICD-10-CM | POA: Diagnosis present

## 2022-02-24 DIAGNOSIS — D61818 Other pancytopenia: Principal | ICD-10-CM | POA: Diagnosis present

## 2022-02-24 DIAGNOSIS — B9620 Unspecified Escherichia coli [E. coli] as the cause of diseases classified elsewhere: Secondary | ICD-10-CM | POA: Diagnosis present

## 2022-02-24 DIAGNOSIS — J189 Pneumonia, unspecified organism: Secondary | ICD-10-CM | POA: Diagnosis present

## 2022-02-24 DIAGNOSIS — I2510 Atherosclerotic heart disease of native coronary artery without angina pectoris: Secondary | ICD-10-CM | POA: Diagnosis not present

## 2022-02-24 DIAGNOSIS — Z825 Family history of asthma and other chronic lower respiratory diseases: Secondary | ICD-10-CM

## 2022-02-24 DIAGNOSIS — Z7984 Long term (current) use of oral hypoglycemic drugs: Secondary | ICD-10-CM

## 2022-02-24 DIAGNOSIS — R64 Cachexia: Secondary | ICD-10-CM | POA: Diagnosis present

## 2022-02-24 DIAGNOSIS — Z7982 Long term (current) use of aspirin: Secondary | ICD-10-CM

## 2022-02-24 DIAGNOSIS — D701 Agranulocytosis secondary to cancer chemotherapy: Secondary | ICD-10-CM | POA: Diagnosis present

## 2022-02-24 DIAGNOSIS — J9621 Acute and chronic respiratory failure with hypoxia: Secondary | ICD-10-CM | POA: Diagnosis present

## 2022-02-24 DIAGNOSIS — C383 Malignant neoplasm of mediastinum, part unspecified: Secondary | ICD-10-CM | POA: Diagnosis present

## 2022-02-24 DIAGNOSIS — K219 Gastro-esophageal reflux disease without esophagitis: Secondary | ICD-10-CM | POA: Diagnosis present

## 2022-02-24 DIAGNOSIS — Z66 Do not resuscitate: Secondary | ICD-10-CM | POA: Diagnosis present

## 2022-02-24 DIAGNOSIS — J811 Chronic pulmonary edema: Secondary | ICD-10-CM | POA: Diagnosis not present

## 2022-02-24 DIAGNOSIS — Z515 Encounter for palliative care: Secondary | ICD-10-CM | POA: Diagnosis not present

## 2022-02-24 DIAGNOSIS — R0902 Hypoxemia: Secondary | ICD-10-CM | POA: Diagnosis not present

## 2022-02-24 DIAGNOSIS — J9690 Respiratory failure, unspecified, unspecified whether with hypoxia or hypercapnia: Secondary | ICD-10-CM | POA: Diagnosis not present

## 2022-02-24 DIAGNOSIS — Z902 Acquired absence of lung [part of]: Secondary | ICD-10-CM

## 2022-02-24 DIAGNOSIS — R7989 Other specified abnormal findings of blood chemistry: Secondary | ICD-10-CM | POA: Diagnosis present

## 2022-02-24 DIAGNOSIS — D61810 Antineoplastic chemotherapy induced pancytopenia: Secondary | ICD-10-CM | POA: Diagnosis present

## 2022-02-24 DIAGNOSIS — J439 Emphysema, unspecified: Secondary | ICD-10-CM | POA: Diagnosis present

## 2022-02-24 DIAGNOSIS — E1165 Type 2 diabetes mellitus with hyperglycemia: Secondary | ICD-10-CM | POA: Diagnosis present

## 2022-02-24 DIAGNOSIS — C3492 Malignant neoplasm of unspecified part of left bronchus or lung: Secondary | ICD-10-CM

## 2022-02-24 DIAGNOSIS — G894 Chronic pain syndrome: Secondary | ICD-10-CM | POA: Diagnosis present

## 2022-02-24 DIAGNOSIS — J948 Other specified pleural conditions: Secondary | ICD-10-CM | POA: Diagnosis not present

## 2022-02-24 DIAGNOSIS — J168 Pneumonia due to other specified infectious organisms: Secondary | ICD-10-CM | POA: Diagnosis not present

## 2022-02-24 DIAGNOSIS — I255 Ischemic cardiomyopathy: Secondary | ICD-10-CM | POA: Diagnosis present

## 2022-02-24 DIAGNOSIS — Z951 Presence of aortocoronary bypass graft: Secondary | ICD-10-CM

## 2022-02-24 DIAGNOSIS — Z7189 Other specified counseling: Secondary | ICD-10-CM | POA: Diagnosis not present

## 2022-02-24 DIAGNOSIS — R739 Hyperglycemia, unspecified: Secondary | ICD-10-CM | POA: Diagnosis not present

## 2022-02-24 DIAGNOSIS — J9601 Acute respiratory failure with hypoxia: Secondary | ICD-10-CM | POA: Diagnosis not present

## 2022-02-24 DIAGNOSIS — R609 Edema, unspecified: Secondary | ICD-10-CM

## 2022-02-24 DIAGNOSIS — R059 Cough, unspecified: Secondary | ICD-10-CM | POA: Diagnosis not present

## 2022-02-24 DIAGNOSIS — R239 Unspecified skin changes: Secondary | ICD-10-CM | POA: Diagnosis not present

## 2022-02-24 DIAGNOSIS — I5032 Chronic diastolic (congestive) heart failure: Secondary | ICD-10-CM | POA: Diagnosis present

## 2022-02-24 DIAGNOSIS — L8995 Pressure ulcer of unspecified site, unstageable: Secondary | ICD-10-CM | POA: Diagnosis not present

## 2022-02-24 DIAGNOSIS — Z923 Personal history of irradiation: Secondary | ICD-10-CM

## 2022-02-24 LAB — COMPREHENSIVE METABOLIC PANEL
ALT: 119 U/L — ABNORMAL HIGH (ref 0–44)
AST: 90 U/L — ABNORMAL HIGH (ref 15–41)
Albumin: 1.6 g/dL — ABNORMAL LOW (ref 3.5–5.0)
Alkaline Phosphatase: 229 U/L — ABNORMAL HIGH (ref 38–126)
Anion gap: 11 (ref 5–15)
BUN: 24 mg/dL — ABNORMAL HIGH (ref 8–23)
CO2: 38 mmol/L — ABNORMAL HIGH (ref 22–32)
Calcium: 7.5 mg/dL — ABNORMAL LOW (ref 8.9–10.3)
Chloride: 93 mmol/L — ABNORMAL LOW (ref 98–111)
Creatinine, Ser: 0.81 mg/dL (ref 0.61–1.24)
GFR, Estimated: >60 mL/min (ref >60)
Glucose, Bld: 265 mg/dL — ABNORMAL HIGH (ref 70–99)
Potassium: 2.3 mmol/L — CL (ref 3.5–5.1)
Sodium: 142 mmol/L (ref 135–145)
Total Bilirubin: 1 mg/dL (ref 0.3–1.2)
Total Protein: 4.4 g/dL — ABNORMAL LOW (ref 6.5–8.1)

## 2022-02-24 LAB — CBC WITH DIFFERENTIAL/PLATELET
Abs Immature Granulocytes: 0 10*3/uL (ref 0.00–0.07)
Basophils Absolute: 0 10*3/uL (ref 0.0–0.1)
Basophils Relative: 0 %
Eosinophils Absolute: 0 10*3/uL (ref 0.0–0.5)
Eosinophils Relative: 0 %
HCT: 16.2 % — ABNORMAL LOW (ref 39.0–52.0)
Hemoglobin: 5.5 g/dL — CL (ref 13.0–17.0)
Immature Granulocytes: 0 %
Lymphocytes Relative: 5 %
Lymphs Abs: 0 10*3/uL — ABNORMAL LOW (ref 0.7–4.0)
MCH: 34.2 pg — ABNORMAL HIGH (ref 26.0–34.0)
MCHC: 34 g/dL (ref 30.0–36.0)
MCV: 100.6 fL — ABNORMAL HIGH (ref 80.0–100.0)
Monocytes Absolute: 0.1 10*3/uL (ref 0.1–1.0)
Monocytes Relative: 25 %
Neutro Abs: 0.1 10*3/uL — CL (ref 1.7–7.7)
Neutrophils Relative %: 70 %
Platelets: 6 10*3/uL — CL (ref 150–400)
RBC: 1.61 MIL/uL — ABNORMAL LOW (ref 4.22–5.81)
RDW: 14 % (ref 11.5–15.5)
WBC: 0.2 10*3/uL — CL (ref 4.0–10.5)
nRBC: 25 /100 WBC — ABNORMAL HIGH

## 2022-02-24 LAB — URINALYSIS, ROUTINE W REFLEX MICROSCOPIC
Bilirubin Urine: NEGATIVE
Glucose, UA: >=500 mg/dL — AB
Ketones, ur: NEGATIVE mg/dL
Leukocytes,Ua: NEGATIVE
Nitrite: NEGATIVE
Protein, ur: 100 mg/dL — AB
Specific Gravity, Urine: 1.018 (ref 1.005–1.030)
pH: 6 (ref 5.0–8.0)

## 2022-02-24 LAB — ABO/RH: ABO/RH(D): B POS

## 2022-02-24 LAB — PREPARE RBC (CROSSMATCH)

## 2022-02-24 LAB — BRAIN NATRIURETIC PEPTIDE: B Natriuretic Peptide: 697.7 pg/mL — ABNORMAL HIGH (ref 0.0–100.0)

## 2022-02-24 LAB — PROTIME-INR
INR: 1.3 — ABNORMAL HIGH (ref 0.8–1.2)
Prothrombin Time: 16 seconds — ABNORMAL HIGH (ref 11.4–15.2)

## 2022-02-24 LAB — LACTIC ACID, PLASMA
Lactic Acid, Venous: 1.6 mmol/L (ref 0.5–1.9)
Lactic Acid, Venous: 1.3 mmol/L (ref 0.5–1.9)

## 2022-02-24 LAB — CBG MONITORING, ED: Glucose-Capillary: 206 mg/dL — ABNORMAL HIGH (ref 70–99)

## 2022-02-24 LAB — APTT: aPTT: 34 seconds (ref 24–36)

## 2022-02-24 MED ORDER — SODIUM CHLORIDE 0.9% FLUSH
3.0000 mL | Freq: Two times a day (BID) | INTRAVENOUS | Status: DC
  Administered 2022-02-25 – 2022-03-01 (×8): 3 mL via INTRAVENOUS

## 2022-02-24 MED ORDER — POTASSIUM CHLORIDE CRYS ER 20 MEQ PO TBCR
40.0000 meq | EXTENDED_RELEASE_TABLET | Freq: Once | ORAL | Status: AC
  Administered 2022-02-24: 40 meq via ORAL
  Filled 2022-02-24: qty 2

## 2022-02-24 MED ORDER — MORPHINE SULFATE 15 MG PO TABS
15.0000 mg | ORAL_TABLET | Freq: Four times a day (QID) | ORAL | Status: DC | PRN
  Administered 2022-02-25 – 2022-02-27 (×4): 15 mg via ORAL
  Filled 2022-02-24 (×4): qty 1

## 2022-02-24 MED ORDER — LOSARTAN POTASSIUM 25 MG PO TABS
25.0000 mg | ORAL_TABLET | Freq: Every day | ORAL | Status: DC
  Administered 2022-02-25 – 2022-02-26 (×2): 25 mg via ORAL
  Filled 2022-02-24 (×2): qty 1

## 2022-02-24 MED ORDER — POTASSIUM CHLORIDE CRYS ER 20 MEQ PO TBCR
20.0000 meq | EXTENDED_RELEASE_TABLET | Freq: Two times a day (BID) | ORAL | Status: DC
  Administered 2022-02-25 – 2022-02-26 (×3): 20 meq via ORAL
  Filled 2022-02-24 (×3): qty 1

## 2022-02-24 MED ORDER — VANCOMYCIN HCL 750 MG/150ML IV SOLN
750.0000 mg | Freq: Two times a day (BID) | INTRAVENOUS | Status: DC
  Administered 2022-02-25: 750 mg via INTRAVENOUS
  Filled 2022-02-24 (×2): qty 150

## 2022-02-24 MED ORDER — ACETAMINOPHEN 650 MG RE SUPP: 650.0000 mg | Freq: Four times a day (QID) | RECTAL | Status: DC | PRN

## 2022-02-24 MED ORDER — SUCRALFATE 1 G PO TABS
1.0000 g | ORAL_TABLET | Freq: Four times a day (QID) | ORAL | Status: DC
  Administered 2022-02-24 – 2022-02-28 (×12): 1 g via ORAL
  Filled 2022-02-24 (×24): qty 1

## 2022-02-24 MED ORDER — VANCOMYCIN HCL 1500 MG/300ML IV SOLN
1500.0000 mg | Freq: Once | INTRAVENOUS | Status: AC
  Administered 2022-02-24: 1500 mg via INTRAVENOUS
  Filled 2022-02-24: qty 300

## 2022-02-24 MED ORDER — SODIUM CHLORIDE 0.9 % IV SOLN: 2.0000 g | Freq: Once | INTRAVENOUS | Status: DC

## 2022-02-24 MED ORDER — MAGNESIUM SULFATE IN D5W 1-5 GM/100ML-% IV SOLN
1.0000 g | Freq: Once | INTRAVENOUS | Status: DC
  Filled 2022-02-24: qty 100

## 2022-02-24 MED ORDER — MORPHINE SULFATE ER 30 MG PO TBCR
30.0000 mg | EXTENDED_RELEASE_TABLET | Freq: Two times a day (BID) | ORAL | Status: DC
  Administered 2022-02-24 – 2022-02-25 (×2): 30 mg via ORAL
  Filled 2022-02-24 (×2): qty 1

## 2022-02-24 MED ORDER — INSULIN ASPART 100 UNIT/ML IJ SOLN
0.0000 [IU] | Freq: Every day | INTRAMUSCULAR | Status: DC
  Administered 2022-02-26: 2 [IU] via SUBCUTANEOUS

## 2022-02-24 MED ORDER — INSULIN ASPART 100 UNIT/ML IJ SOLN: 0.0000 [IU] | Freq: Three times a day (TID) | INTRAMUSCULAR | Status: DC

## 2022-02-24 MED ORDER — POTASSIUM CHLORIDE 10 MEQ/100ML IV SOLN
10.0000 meq | INTRAVENOUS | Status: DC
  Administered 2022-02-24 (×2): 10 meq via INTRAVENOUS
  Filled 2022-02-24 (×2): qty 100

## 2022-02-24 MED ORDER — ACETAMINOPHEN 325 MG PO TABS
650.0000 mg | ORAL_TABLET | Freq: Four times a day (QID) | ORAL | Status: DC | PRN
  Administered 2022-02-28: 650 mg via ORAL
  Filled 2022-02-24 (×3): qty 2

## 2022-02-24 MED ORDER — CARVEDILOL 6.25 MG PO TABS
6.2500 mg | ORAL_TABLET | Freq: Two times a day (BID) | ORAL | Status: DC
  Administered 2022-02-24 – 2022-02-26 (×4): 6.25 mg via ORAL
  Filled 2022-02-24: qty 2
  Filled 2022-02-24 (×3): qty 1

## 2022-02-24 MED ORDER — POLYETHYLENE GLYCOL 3350 17 G PO PACK: 17.0000 g | PACK | Freq: Every day | ORAL | Status: DC | PRN

## 2022-02-24 MED ORDER — SODIUM CHLORIDE 0.9% IV SOLUTION: Freq: Once | INTRAVENOUS | Status: AC

## 2022-02-24 MED ORDER — BISACODYL 5 MG PO TBEC: 5.0000 mg | DELAYED_RELEASE_TABLET | Freq: Every day | ORAL | Status: DC | PRN

## 2022-02-24 MED ORDER — SODIUM CHLORIDE 0.9 % IV SOLN
2.0000 g | INTRAVENOUS | Status: DC
  Administered 2022-02-24 – 2022-02-25 (×2): 2 g via INTRAVENOUS
  Filled 2022-02-24 (×2): qty 20

## 2022-02-24 MED ORDER — SODIUM CHLORIDE 0.9 % IV SOLN
100.0000 mg | Freq: Two times a day (BID) | INTRAVENOUS | Status: DC
  Administered 2022-02-24 – 2022-02-26 (×5): 100 mg via INTRAVENOUS
  Filled 2022-02-24 (×6): qty 100

## 2022-02-24 MED ORDER — LACTATED RINGERS IV BOLUS (SEPSIS)
1000.0000 mL | Freq: Once | INTRAVENOUS | Status: AC
  Administered 2022-02-24: 1000 mL via INTRAVENOUS

## 2022-02-24 NOTE — Progress Notes (Signed)
Orders placed for port access and de-access the day of brain MRI at [facility].   Mont Dutton R.T.(R)(T) Radiation Special Procedures Navigator

## 2022-02-24 NOTE — ED Triage Notes (Signed)
Pt bib ems from home after home health found patient to have oxygen saturations of 65%. EMS reports pt wears 4L [place] at baseline. Hx lung CA, currently receiving chemo. Per ems, wife reports pt with increased urine output with foul odor and dark in color. Pt also with increased bil lower extremities, denies hx heart failure. Pt with increased falls over the last few weeks and has not been evaluated.  500ccNS given en route.  CBG 347 RR 38 ETCo2 10-16 HR 110 100% NRB 118/64

## 2022-02-24 NOTE — ED Notes (Signed)
ED TO INPATIENT HANDOFF REPORT  ED Nurse Name and Phone #: Albina Billet [phone]  S Name/Age/Gender Kelly Olson. 72 y.o. male Room/Bed: 023C/023C  Code Status   Code Status: Full Code  Home/SNF/Other Home Patient oriented to: self, place, time, and situation Is this baseline? Yes   Triage Complete: Triage complete  Chief Complaint Pneumonia [J18.9]  Triage Note Pt bib ems from home after home health found patient to have oxygen saturations of 65%. EMS reports pt wears 4L [place] at baseline. Hx lung CA, currently receiving chemo. Per ems, wife reports pt with increased urine output with foul odor and dark in color. Pt also with increased bil lower extremities, denies hx heart failure. Pt with increased falls over the last few weeks and has not been evaluated.  500ccNS given en route.  CBG 347 RR 38 ETCo2 10-16 HR 110 100% NRB 118/64   Allergies No Known Allergies  Level of Care/Admitting Diagnosis ED Disposition   ED Disposition: Admit Condition: None Comment: Hospital Area: [place] [[id number]]  Level of Care: Progressive [102]  Admit to Progressive based on following criteria: MULTISYSTEM THREATS such as stable sepsis, metabolic/electrolyte imbalance with or without encephalopathy that is responding to early treatment.  May [facility] patient to [facility] if equivalent level of care is available:: Yes  Covid Evaluation: Asymptomatic - no recent exposure (last 10 days) testing not required  Diagnosis: Pneumonia [227785]  Admitting Physician: Vianne Bulls [[id number]]  Attending Physician: Vianne Bulls [[id number]]  Certification:: I certify this patient will need inpatient services for at least 2 midnights  Estimated Length of Stay: 4      B Medical/Surgery History Past Medical History:  Diagnosis Date   Coronary artery disease, occlusive 06/02/2014   Multivessel CAD.mLAD-100%, mRCA 99%, dRCA 100%, mLCX 90%  And EF 35-45%. -->   Referred for CABG; nonischemic Myoview March 2020   Former heavy tobacco smoker    Quit in April 2016    GERD (gastroesophageal reflux disease)    Hyperlipidemia with target LDL less than 70    Hypertension    Ischemic cardiomyopathy - resolved 05/2014   Myoview: EF ~33% with "infarction vs. severe resting ischemia in LAD & RCA territory; b) EF by Cath: 35-45%. 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   Stage T2 a, N0   Past Surgical History:  Procedure Laterality Date   CARDIAC CATHETERIZATION N/A 06/02/2014   Procedure: Left Heart Cath and Coronary Angiography;  Surgeon: Leonie Man, MD;  Location: [facility] INVASIVE CV LAB CUPID;  Service: Cardiovascular;  mLAD-100%, mRCA 99%, dRCA 100%, mLCX 90%  And EF 35-45%.   CORONARY ARTERY BYPASS GRAFT N/A 06/04/2014   Procedure: CORONARY ARTERY BYPASS GRAFTING (CABG) times four using bilateral internal mammary arteries and EVH for left  leg saphenous vein;  Surgeon: Gaye Pollack, MD;  Location: [facility];  Service: Open Heart Surgery;  LIMA-OM, RIMA-LAD, SVG-Diag, SVG-rPDA   IR IMAGING GUIDED PORT INSERTION  05/16/2021   NM MYOVIEW LTD  05/28/2014   Pre-CABG:  High Risk Nuclear Stress Test with multivessel distribution ischemia.  -- Severely ischemic Cardiomyoapthy with evidence of at least 2  vessel disease & EF of ~33%.  Images are consistent with either infarction or severe resting ischemia in the LAD & RCA territory.   NM MYOVIEW LTD  03/2018   EF 50-55%.  Small basal inferior fixed defect consistent with diaphragmatic attenuation.  No ischemia or infarction.  Septal dyskinesis due to post CABG state   TEE WITHOUT CARDIOVERSION N/A 06/04/2014   Procedure: TRANSESOPHAGEAL  ECHOCARDIOGRAM (TEE);  Surgeon: Gaye Pollack, MD;  Location: [facility];  Service: Open Heart Surgery;  Laterality: N/A;   THORACOTOMY Right 07/10/2016   Procedure: THORACOTOMY;  Surgeon: Gaye Pollack, MD;  Location: [facility];  Service: Thoracic;  Laterality: Right;   THORACOTOMY/LOBECTOMY Right 07/29/2018   Procedure: THORACOTOMY/RIGHT LOWER LOBECTOMY;  Surgeon: Gaye Pollack, MD;  Location: [facility];  Service: Thoracic;  Laterality: Right;   [place] ECHOCARDIOGRAM  08/10/2021   EF 60 to 65%.  GR 1 DD.  No RWMA.  Normal RV size and function.  Normal PAP.  Normal aortic and mitral valves.  Normal RAP.  Conclusion(s)/Recommendation(s): Normal biventricular function withoutevidence of hemodynamically significant valvular heart disease. . EF 50-55% with mild HK of basal anteroseptal myocardium. GR 1 DD.   VIDEO ASSISTED THORACOSCOPY (VATS)/WEDGE RESECTION Right 07/10/2016   Procedure: VIDEO ASSISTED THORACOSCOPY (VATS)/WEDGE RESECTION;  Surgeon: Gaye Pollack, MD;  Location: [facility];  Service: Thoracic;  Laterality: Right;   VIDEO BRONCHOSCOPY WITH ENDOBRONCHIAL NAVIGATION N/A 04/13/2021   Procedure: VIDEO BRONCHOSCOPY WITH ENDOBRONCHIAL NAVIGATION;  Surgeon: Melrose Nakayama, MD;  Location: [facility];  Service: Thoracic;  Laterality: N/A;     A IV Location/Drains/Wounds Patient Lines/Drains/Airways Status     Active Line/Drains/Airways     Name Placement date Placement time Site Days   Implanted Port 05/16/21 Right Chest 05/16/21  1517  Chest  284   Peripheral IV 02/13/2022 Left Hand 02/05/2022  1802  Hand  less than 1   Peripheral IV 02/28/2022 20 G Posterior;Right Forearm 02/05/2022  1940  Forearm  less than 1            Intake/Output Last 24 hours  Intake/Output Summary (Last 24 hours) at 02/01/2022 2231 Last data filed at 02/28/2022 2230 Gross per 24 hour  Intake 1100 ml  Output no documentation  Net 1100 ml    Labs/Imaging Results for orders placed or  performed during the hospital encounter of 02/10/2022 (from the past 48 hour(s))  Lactic acid, plasma     Status: None   Collection Time: 02/13/2022  7:36 PM  Result Value Ref Range   Lactic Acid, Venous 1.6 0.5 - 1.9 mmol/L    Comment: Performed at [facility], [address] [place], [place] [address]  Comprehensive metabolic panel     Status: Abnormal   Collection Time: 02/15/2022  7:36 PM  Result Value Ref Range   Sodium 142 135 - 145 mmol/L   Potassium 2.3 (LL) 3.5 - 5.1 mmol/L    Comment: CRITICAL RESULT CALLED TO, READ BACK BY AND VERIFIED WITH K.Maude Gloor,RN @2035  02/23/2022 VANG.J   Chloride 93 (L) 98 - 111 mmol/L   CO2 38 (H) 22 - 32 mmol/L   Glucose, Bld 265 (H) 70 - 99 mg/dL    Comment: Glucose reference range applies only to samples taken after fasting for at least 8 hours.   BUN 24 (H) 8 - 23 mg/dL   Creatinine, Ser 0.81 0.61 - 1.24 mg/dL   Calcium 7.5 (L) 8.9 - 10.3 mg/dL  Total Protein 4.4 (L) 6.5 - 8.1 g/dL   Albumin 1.6 (L) 3.5 - 5.0 g/dL   AST 90 (H) 15 - 41 U/L   ALT 119 (H) 0 - 44 U/L   Alkaline Phosphatase 229 (H) 38 - 126 U/L   Total Bilirubin 1.0 0.3 - 1.2 mg/dL   GFR, Estimated >60 >60 mL/min    Comment: (NOTE) Calculated using the CKD-EPI Creatinine Equation (2021)    Anion gap 11 5 - 15    Comment: Performed at [facility] [address] [place], [place] [address]  Protime-INR     Status: Abnormal   Collection Time: 02/02/2022  7:36 PM  Result Value Ref Range   Prothrombin Time 16.0 (H) 11.4 - 15.2 seconds   INR 1.3 (H) 0.8 - 1.2    Comment: (NOTE) INR goal varies based on device and disease states. Performed at [facility], [place] [address], [place], [place] [address]   APTT     Status: None   Collection Time: 02/21/2022  7:36 PM  Result Value Ref Range   aPTT 34 24 - 36 seconds    Comment: Performed at [facility] [address], [place], [place] [address]  Urinalysis, Routine w reflex microscopic -Urine, Clean Catch      Status: Abnormal   Collection Time: 02/16/2022  7:36 PM  Result Value Ref Range   Color, Urine YELLOW YELLOW   APPearance HAZY (A) CLEAR   Specific Gravity, Urine 1.018 1.005 - 1.030   pH 6.0 5.0 - 8.0   Glucose, UA >=500 (A) NEGATIVE mg/dL   Hgb urine dipstick LARGE (A) NEGATIVE   Bilirubin Urine NEGATIVE NEGATIVE   Ketones, ur NEGATIVE NEGATIVE mg/dL   Protein, ur 100 (A) NEGATIVE mg/dL   Nitrite NEGATIVE NEGATIVE   Leukocytes,Ua NEGATIVE NEGATIVE   RBC / HPF 0-5 0 - 5 RBC/hpf   WBC, UA 0-5 0 - 5 WBC/hpf   Bacteria, UA FEW (A) NONE SEEN   Squamous Epithelial / HPF 0-5 0 - 5 /HPF   Mucus PRESENT    Granular Casts, UA PRESENT     Comment: Performed at [facility], [place] [address] [place], [place] [address]  Type and screen [place]     Status: None (Preliminary result)   Collection Time: 02/25/2022  7:36 PM  Result Value Ref Range   ABO/RH(D) B POS    Antibody Screen NEG    Sample Expiration 02/27/2022,2359    Unit Number [id number]    Blood Component Type RED CELLS,LR    Unit division 00    Status of Unit ISSUED    Transfusion Status OK TO TRANSFUSE    Crossmatch Result      Compatible Performed at [facility], [place] [address], [place], [place] [address]    Unit Number [id number]    Blood Component Type RED CELLS,LR    Unit division 00    Status of Unit ALLOCATED    Transfusion Status OK TO TRANSFUSE    Crossmatch Result Compatible   Brain natriuretic peptide     Status: Abnormal   Collection Time: 02/07/2022  7:36 PM  Result Value Ref Range   B Natriuretic Peptide 697.7 (H) 0.0 - 100.0 pg/mL    Comment: Performed at [facility] [address], [place], [place] [address]  ABO/Rh     Status: None   Collection Time: 02/08/2022  7:46 PM  Result Value  Ref Range   ABO/RH(D)      B POS Performed at [facility], [place] [address] [place], [place] [address]   CBC with Differential/Platelet     Status: Abnormal   Collection  Time: 02/10/2022  8:07 PM  Result Value Ref Range   WBC 0.2 (LL) 4.0 - 10.5 K/uL    Comment: CRITICAL RESULT CALLED TO, READ BACK BY AND VERIFIED WITH: NEW SAMPLE K.Teiara Baria,RN @2035  02/22/2022 VANG.J    RBC 1.61 (L) 4.22 - 5.81 MIL/uL   Hemoglobin 5.5 (LL) 13.0 - 17.0 g/dL    Comment: REPEATED TO VERIFY CRITICAL RESULT CALLED TO, READ BACK BY AND VERIFIED WITH: K.Tatiana Courter,RN @2035  02/22/2022 VANG.J NEW SAMPLE    HCT 16.2 (L) 39.0 - 52.0 %   MCV 100.6 (H) 80.0 - 100.0 fL   MCH 34.2 (H) 26.0 - 34.0 pg   MCHC 34.0 30.0 - 36.0 g/dL   RDW 14.0 11.5 - 15.5 %   Platelets 6 (LL) 150 - 400 K/uL    Comment: SPECIMEN CHECKED FOR CLOTS Immature Platelet Fraction may be clinically indicated, consider ordering this additional test [id number] REPEATED TO VERIFY PLATELET COUNT CONFIRMED BY SMEAR CRITICAL RESULT CALLED TO, READ BACK BY AND VERIFIED WITH: K.Sweta Halseth,RN @2035  02/22/2022 VANG.J    Neutrophils Relative % 70 %   Neutro Abs 0.1 (LL) 1.7 - 7.7 K/uL    Comment: REPEATED TO VERIFY CRITICAL RESULT CALLED TO, READ BACK BY AND VERIFIED WITH: K.Deondria Puryear,RN @2035  02/22/2022 VANG.J    Lymphocytes Relative 5 %   Lymphs Abs 0.0 (L) 0.7 - 4.0 K/uL   Monocytes Relative 25 %   Monocytes Absolute 0.1 0.1 - 1.0 K/uL   Eosinophils Relative 0 %   Eosinophils Absolute 0.0 0.0 - 0.5 K/uL   Basophils Relative 0 %   Basophils Absolute 0.0 0.0 - 0.1 K/uL   Smear Review Reviewed    nRBC 25 (H) 0 /100 WBC   Immature Granulocytes 0 %   Abs Immature Granulocytes 0.00 0.00 - 0.07 K/uL    Comment: Performed at [facility] [address] [place], [place] [address]  Lactic acid, plasma     Status: None   Collection Time: 02/01/2022  8:29 PM  Result Value Ref Range   Lactic Acid, Venous 1.3 0.5 - 1.9 mmol/L    Comment: Performed at [facility] [address], [place], [place] [address]  Prepare RBC (crossmatch)     Status: None   Collection Time: 01/31/2022  9:21 PM  Result Value Ref Range   Order  Confirmation      ORDER PROCESSED BY BLOOD BANK Performed at [facility], [place] [address] [place], [place] [address]   Prepare platelet pheresis     Status: None (Preliminary result)   Collection Time: 02/09/2022  9:22 PM  Result Value Ref Range   Unit Number [id number]    Blood Component Type PLTP3 PSORALEN TREATED    Unit division 00    Status of Unit ALLOCATED    Transfusion Status OK TO TRANSFUSE    DG Chest Port 1 View  Result Date: 02/13/2022 CLINICAL DATA:  Possible sepsis EXAM: PORTABLE CHEST 1 VIEW COMPARISON:  01/26/2022, CT 02/07/2022, PET CT 01/12/2022 FINDINGS: Right-sided central venous port tip at the cavoatrial region. Post sternotomy changes. Worsened bilateral left greater than right heterogeneous airspace disease. Underlying emphysema and chronic interstitial opacity. Normal cardiac size. Aortic atherosclerosis. No pneumothorax. IMPRESSION: Emphysema. Interval worsening of left greater than  right heterogeneous airspace disease which may be due to edema, pneumonia, or combination of the 2, on the left this superimposes CT demonstrated nodularity at the left base, see chest CT report 02/07/2022. Electronically Signed   By: Donavan Foil M.D.   On: 02/20/2022 19:19    Pending Labs Unresulted Labs (From admission, onward)     Start     Ordered   02/25/22 0500  Comprehensive metabolic panel  Daily,   R      02/09/2022 2204   02/25/22 0500  CBC with Differential/Platelet  Daily,   R      02/02/2022 2204   02/07/2022 2206  MRSA Next Gen by PCR, Nasal  (MRSA Screening)  ONCE - URGENT,   URGENT        02/21/2022 2206   02/15/2022 2204  Legionella Pneumophila Serogp 1 Ur Ag  (COPD / Pneumonia / Cellulitis / Lower Extremity Wound)  Add-on,   AD        02/27/2022 2204   02/28/2022 2204  Strep pneumoniae urinary antigen  (COPD / Pneumonia / Cellulitis / Lower Extremity Wound)  Add-on,   AD        02/04/2022 2204   02/28/2022 2204  Expectorated Sputum Assessment w Gram Stain, Rflx to Resp  Cult  (COPD / Pneumonia / Cellulitis / Lower Extremity Wound)  Once,   R       Question:  Patient immune status  Answer:  Immunocompromised   02/20/2022 2204   02/07/2022 1839  Resp panel by RT-PCR (RSV, Flu A&B, Covid) Anterior Nasal Swab  (Resp panel by RT-PCR (RSV, Flu A&B, Covid))  Once,   URGENT        02/06/2022 1838   02/06/2022 1829  CBC with Differential  (Undifferentiated presentation (screening labs and basic nursing orders))  ONCE - STAT,   STAT        02/23/2022 1838   02/22/2022 1829  Blood Culture (routine x 2)  (Undifferentiated presentation (screening labs and basic nursing orders))  BLOOD CULTURE X 2,   STAT      02/12/2022 1838            Vitals/Pain Today's Vitals   02/28/2022 2015 02/05/2022 2100 02/17/2022 2200 02/05/2022 2216  BP: 130/66 128/67 137/82 (Abnormal) 145/78  Pulse: 95 (Abnormal) 101 (Abnormal) 104 (Abnormal) 105  Resp:  (Abnormal) 21 (Abnormal) 24 (Abnormal) 23  Temp:   97.9 F (36.6 C) 98 F (36.7 C)  TempSrc:   Oral   SpO2: 100% 99% 100% 100%  Weight:      Height:      PainSc:        Isolation Precautions Protective Precautions  Medications Medications  potassium chloride 10 mEq in 100 mL IVPB (10 mEq Intravenous New Bag/Given 02/28/2022 2231)  vancomycin (VANCOREADY) IVPB 1500 mg/300 mL (1,500 mg Intravenous New Bag/Given 02/11/2022 2224)  morphine (MS CONTIN) 12 hr tablet 30 mg (has no administration in time range)  morphine (MSIR) tablet 15 mg (has no administration in time range)  carvedilol (COREG) tablet 6.25 mg (has no administration in time range)  losartan (COZAAR) tablet 25 mg (has no administration in time range)  sucralfate (CARAFATE) tablet 1 g (has no administration in time range)  potassium chloride SA (KLOR-CON M) CR tablet 20 mEq (has no administration in time range)  potassium chloride SA (KLOR-CON M) CR tablet 40 mEq (has no administration in time range)  magnesium sulfate IVPB 1 g 100 mL (has no administration in time  range)  insulin aspart  (novoLOG) injection 0-6 Units (has no administration in time range)  insulin aspart (novoLOG) injection 0-5 Units (has no administration in time range)  sodium chloride flush (NS) 0.9 % injection 3 mL (3 mLs Intravenous Not Given 02/07/2022 2211)  acetaminophen (TYLENOL) tablet 650 mg (has no administration in time range)    Or  acetaminophen (TYLENOL) suppository 650 mg (has no administration in time range)  polyethylene glycol (MIRALAX / GLYCOLAX) packet 17 g (has no administration in time range)  bisacodyl (DULCOLAX) EC tablet 5 mg (has no administration in time range)  cefTRIAXone (ROCEPHIN) 2 g in sodium chloride 0.9 % 100 mL IVPB (has no administration in time range)  doxycycline (VIBRAMYCIN) 100 mg in sodium chloride 0.9 % 250 mL IVPB (has no administration in time range)  vancomycin (VANCOREADY) IVPB 750 mg/150 mL (has no administration in time range)  lactated ringers bolus 1,000 mL (0 mLs Intravenous Stopped 02/28/2022 2055)  potassium chloride SA (KLOR-CON M) CR tablet 40 mEq (40 mEq Oral Given 02/19/2022 2130)  0.9 %  sodium chloride infusion (Manually program via Guardrails IV Fluids) ( Intravenous New Bag/Given 02/08/2022 2206)    Mobility non-ambulatory     Focused Assessments Pulmonary Assessment Handoff:  Lung sounds:   O2 Device: NRB O2 Flow Rate (L/min): 5 L/min    R Recommendations: See Admitting Provider Note  Report given to:   Additional Notes: Wife at bedside, A&Ox4. Non-ambulatory for a week. Wife would like a PT consult.

## 2022-02-24 NOTE — H&P (Signed)
History and Physical    Kelly Olson. HCW:[id number] DOB: [birth year] DOA: 02/09/2022  PCP: Wenda Low, MD   Patient coming from: Home   Chief Complaint: Low oxygen saturation   HPI: Kelly Olson. is a pleasant 72 y.o. male with medical history significant for CAD, ischemic cardiomyopathy with recovered EF, recurrent/metastatic small cell lung cancer, chronic pain, and chronic hypoxic respiratory failure who presents emergency department after he was found by home health to have oxygen saturation in the 60s.  Patient was admitted to the hospital 1 month ago with acute hypoxic respiratory failure due to pneumonia and COPD exacerbation.  He was discharged with 2 L/min of supplemental oxygen which he had been using ever since.  He had been feeling more short of breath the last couple days and turned his oxygen up to 3 L/min.  He was seen by home health today who noted his oxygen saturation to be in the 60s despite supplemental oxygen.  He reports recent nonproductive cough.  He denies fever, chills, or chest pain.  He reports that his chronic bilateral lower extremity swelling has not changed recently.  He denies any bleeding but has been bruising easily.  ED Course: Upon arrival to the ED, patient is found to have a temp of 37.8 C, mild elevation in heart and respiratory rates, stable blood pressure, and oxygen saturation 100% on nonrebreather.  Chest x-ray notable for emphysema and interval worsening of left greater than right airspace disease.  Labs are most notable for glucose 264, potassium 2.3, bicarbonate 38, alkaline phosphatase 229, albumin 1.6, AST 90, ALT 119, BNP 698, WBC 200, hemoglobin 5.5, and platelets 6000.  Blood cultures were collected in the ED and the patient was treated with vancomycin, cefepime, 1 L of LR, and 40 mill equivalents of IV potassium.  2 units of RBC and 1 unit platelet were ordered for transfusion.  Hematology-oncology was consulted by the ED  physician.  Review of Systems:  All other systems reviewed and apart from HPI, are negative.  Past Medical History:  Diagnosis Date   Coronary artery disease, occlusive 06/02/2014   Multivessel CAD.mLAD-100%, mRCA 99%, dRCA 100%, mLCX 90%  And EF 35-45%. -->  Referred for CABG; nonischemic Myoview March 2020   Former heavy tobacco smoker    Quit in April 2016    GERD (gastroesophageal reflux disease)    Hyperlipidemia with target LDL less than 70    Hypertension    Ischemic cardiomyopathy - resolved 05/2014   Myoview: EF ~33% with "infarction vs. severe resting ischemia in LAD & RCA territory; b) EF by Cath: 35-45%. 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   Stage T2 a, N0    Past Surgical History:  Procedure Laterality Date   CARDIAC CATHETERIZATION N/A 06/02/2014   Procedure: Left Heart Cath and Coronary Angiography;  Surgeon: Leonie Man, MD;  Location[facility] INVASIVE CV LAB CUPID;  Service: Cardiovascular;  mLAD-100%, mRCA 99%, dRCA 100%, mLCX 90%  And EF 35-45%.   CORONARY ARTERY BYPASS GRAFT N/A 06/04/2014   Procedure: CORONARY ARTERY BYPASS GRAFTING (CABG) times four using bilateral internal mammary arteries and EVH for left  leg saphenous vein;  Surgeon: Gaye Pollack, MD;  Location: [facility] OR;  Service: Open Heart Surgery;  LIMA-OM, RIMA-LAD, SVG-Diag, SVG-rPDA   IR IMAGING GUIDED PORT INSERTION  05/16/2021   NM MYOVIEW LTD  05/28/2014   Pre-CABG:  High Risk Nuclear Stress Test with multivessel distribution ischemia.  -- Severely ischemic Cardiomyoapthy with evidence of at least 2 vessel disease & EF of ~33%.  Images are consistent with either infarction or severe resting ischemia in the LAD & RCA territory.   NM  MYOVIEW LTD  03/2018   EF 50-55%.  Small basal inferior fixed defect consistent with diaphragmatic attenuation.  No ischemia or infarction.  Septal dyskinesis due to post CABG state   TEE WITHOUT CARDIOVERSION N/A 06/04/2014   Procedure: TRANSESOPHAGEAL ECHOCARDIOGRAM (TEE);  Surgeon: Gaye Pollack, MD;  Location: [place];  Service: Open Heart Surgery;  Laterality: N/A;   THORACOTOMY Right 07/10/2016   Procedure: THORACOTOMY;  Surgeon: Gaye Pollack, MD;  Location: [facility] OR;  Service: Thoracic;  Laterality: Right;   THORACOTOMY/LOBECTOMY Right 07/29/2018   Procedure: THORACOTOMY/RIGHT LOWER LOBECTOMY;  Surgeon: Gaye Pollack, MD;  Location: [place];  Service: Thoracic;  Laterality: Right;   [place] ECHOCARDIOGRAM  08/10/2021   EF 60 to 65%.  GR 1 DD.  No RWMA.  Normal RV size and function.  Normal PAP.  Normal aortic and mitral valves.  Normal RAP.  Conclusion(s)/Recommendation(s): Normal biventricular function withoutevidence of hemodynamically significant valvular heart disease. . EF 50-55% with mild HK of basal anteroseptal myocardium. GR 1 DD.   VIDEO ASSISTED THORACOSCOPY (VATS)/WEDGE RESECTION Right 07/10/2016   Procedure: VIDEO ASSISTED THORACOSCOPY (VATS)/WEDGE RESECTION;  Surgeon: Gaye Pollack, MD;  Location: [place] OR;  Service: Thoracic;  Laterality: Right;   VIDEO BRONCHOSCOPY WITH ENDOBRONCHIAL NAVIGATION N/A 04/13/2021   Procedure: VIDEO BRONCHOSCOPY WITH ENDOBRONCHIAL NAVIGATION;  Surgeon: Melrose Nakayama, MD;  Location: [place];  Service: Thoracic;  Laterality: N/A;    Social History:   reports that he quit smoking about 7 years ago. His smoking use included cigarettes. He has a 36.80 pack-year smoking history. He has never used smokeless tobacco. He reports current alcohol use of about 7.0 standard drinks of alcohol per week. He reports that he does not use drugs.  No Known Allergies  Family History  Problem Relation Age of Onset   Emphysema  Mother    Cardiomyopathy Father    Healthy Sister    Heart attack Maternal Grandfather    Healthy Sister    Heart disease Paternal Uncle      Prior to Admission medications   Medication Sig Start Date End Date Taking? Authorizing Provider  albuterol (VENTOLIN HFA) 108 (90 Base) MCG/ACT inhaler Inhale 2 puffs into the lungs every 6 (six) hours as needed for shortness of breath or wheezing. 12/08/21   [provider]  aspirin 81 MG tablet Take 81 mg by mouth daily.    [provider]  atorvastatin (LIPITOR) 40 MG tablet TAKE 1 TABLET AT BEDTIME Patient taking differently: Take 40 mg by mouth at bedtime. 09/28/21   Leonie Man, MD  carvedilol (COREG) 3.125 MG tablet TAKE 2 TABLETS TWICE A DAY WITH     MEALS Patient taking differently: Take 6.25 mg by mouth 2 (two) times daily with a meal. 09/28/21   Leonie Man, MD  losartan (COZAAR) 25 MG tablet Take 1 tablet (25 mg total) by mouth daily. 01/03/22   Charlynne Cousins, MD  metFORMIN ([place]) 500  MG 24 hr tablet Take 500 mg by mouth 2 (two) times daily. 12/08/21   [provider]  mirtazapine (REMERON) 30 MG tablet Take 1 tablet (30 mg total) by mouth at bedtime. 02/22/22   Curt Bears, MD  morphine (MS CONTIN) 30 MG 12 hr tablet Take 1 tablet (30 mg total) by mouth every 12 (twelve) hours. 02/14/22   Curt Bears, MD  morphine (MSIR) 15 MG tablet Take 1 tablet (15 mg total) by mouth every 6 (six) hours as needed for severe pain. 02/14/22   Curt Bears, MD  potassium chloride SA (KLOR-CON M) 20 MEQ tablet Take 1 tablet (20 mEq total) by mouth 2 (two) times daily. 02/07/22   Curt Bears, MD  Pseudoephedrine-APAP 30-325 MG TABS Take 1 tablet by mouth daily as needed (sinus congestion). 04/30/21   [provider]  sildenafil (VIAGRA) 50 MG tablet Take 1 tablet (50 mg total) by mouth daily as needed for erectile dysfunction. 02/23/16   Leonie Man, MD  sucralfate (CARAFATE) 1 g  tablet Take 1 g by mouth 4 (four) times daily. 02/02/22   [provider]    Physical Exam: Vitals:   02/12/2022 1845 02/12/2022 2015 02/17/2022 2100 02/11/2022 2200  BP: 139/85 130/66 128/67 137/82  Pulse: (!) 105 95 (!) 101 (!) 104  Resp: (!) 24  (!) 21 (!) 24  Temp:    97.9 F (36.6 C)  TempSrc:    Oral  SpO2: 98% 100% 99% 100%  Weight:      Height:        Constitutional: NAD, calm  Eyes: PERTLA, lids and conjunctivae normal ENMT: Mucous membranes are moist. Posterior pharynx clear of any exudate or lesions.   Neck: supple, no masses  Respiratory: Dyspnea with speech. Coarse rales bilaterally. No accessory muscle use.  Cardiovascular: S1 & S2 heard, regular rate and rhythm. Pretibial pitting edema b/l.  Abdomen: No distension, no tenderness, soft. Bowel sounds active.  Musculoskeletal: no clubbing / cyanosis. No joint deformity upper and lower extremities.   Skin: Ecchymosis and petechiae. Otherwise warm, dry, well-perfused. Neurologic: CN 2-12 grossly intact. Moving all extremities. Alert and oriented.  Psychiatric: Pleasant. Cooperative.    Labs and Imaging on Admission: I have personally reviewed following labs and imaging studies  CBC: Recent Labs  Lab 02/21/22 1214 02/28/2022 2007  WBC 0.5* 0.2*  NEUTROABS 0.4* 0.1*  HGB 8.9* 5.5*  HCT 26.9* 16.2*  MCV 101.1* 100.6*  PLT 34* 6*   Basic Metabolic Panel: Recent Labs  Lab 02/21/22 1214 02/11/2022 1936  NA 146* 142  K 3.0* 2.3*  CL 97* 93*  CO2 43* 38*  GLUCOSE 244* 265*  BUN 24* 24*  CREATININE 0.65 0.81  CALCIUM 7.9* 7.5*   GFR: Estimated Creatinine Clearance: 77.8 mL/min (by C-G formula based on SCr of 0.81 mg/dL). Liver Function Tests: Recent Labs  Lab 02/21/22 1214 02/23/2022 1936  AST 68* 90*  ALT 98* 119*  ALKPHOS 400* 229*  BILITOT 1.1 1.0  PROT 5.0* 4.4*  ALBUMIN 2.8* 1.6*   No results for input(s): "LIPASE", "AMYLASE" in the last 168 hours. No results for input(s): "AMMONIA" in the last  168 hours. Coagulation Profile: Recent Labs  Lab 02/28/2022 1936  INR 1.3*   Cardiac Enzymes: No results for input(s): "CKTOTAL", "CKMB", "CKMBINDEX", "TROPONINI" in the last 168 hours. BNP (last 3 results) No results for input(s): "PROBNP" in the last 8760 hours. HbA1C: No results for input(s): "HGBA1C" in the last 72 hours. CBG: No  results for input(s): "GLUCAP" in the last 168 hours. Lipid Profile: No results for input(s): "CHOL", "HDL", "LDLCALC", "TRIG", "CHOLHDL", "LDLDIRECT" in the last 72 hours. Thyroid Function Tests: No results for input(s): "TSH", "T4TOTAL", "FREET4", "T3FREE", "THYROIDAB" in the last 72 hours. Anemia Panel: No results for input(s): "VITAMINB12", "FOLATE", "FERRITIN", "TIBC", "IRON", "RETICCTPCT" in the last 72 hours. Urine analysis:    Component Value Date/Time   COLORURINE YELLOW 02/18/2022 1936   APPEARANCEUR HAZY (A) 02/12/2022 1936   LABSPEC 1.018 01/30/2022 1936   PHURINE 6.0 01/31/2022 1936   GLUCOSEU >=500 (A) 02/18/2022 1936   HGBUR LARGE (A) 02/09/2022 [place] NEGATIVE 02/19/2022 1936   KETONESUR NEGATIVE 02/16/2022 1936   PROTEINUR 100 (A) 02/14/2022 1936   NITRITE NEGATIVE 02/27/2022 1936   LEUKOCYTESUR NEGATIVE 01/31/2022 1936   Sepsis Labs: @LABRCNTIP (procalcitonin:4,lacticidven:4) )No results found for this or any previous visit (from the past 240 hour(s)).   Radiological Exams on Admission: DG Chest Port 1 View  Result Date: 02/07/2022 CLINICAL DATA:  Possible sepsis EXAM: PORTABLE CHEST 1 VIEW COMPARISON:  01/26/2022, CT 02/07/2022, PET CT 01/12/2022 FINDINGS: Right-sided central venous port tip at the cavoatrial region. Post sternotomy changes. Worsened bilateral left greater than right heterogeneous airspace disease. Underlying emphysema and chronic interstitial opacity. Normal cardiac size. Aortic atherosclerosis. No pneumothorax. IMPRESSION: Emphysema. Interval worsening of left greater than right heterogeneous  airspace disease which may be due to edema, pneumonia, or combination of the 2, on the left this superimposes CT demonstrated nodularity at the left base, see chest CT report 02/07/2022. Electronically Signed   By: Donavan Foil M.D.   On: 01/30/2022 19:19    EKG: Independently reviewed. Sinus tachycardia, rate 101, QTc 518.   Assessment/Plan   1. Pneumonia; acute hypoxic respiratory failure  - Blood cultures collected in ED and empiric antibiotics started  - Check strep pneumo and legionella antigens, continue antibiotics, continue supplemental O2, follow cultures and clinical course    2. Pancytopenia - On admission, WBC 200 with ANC 140 and ALC 10; Hgb 5.5; platelets 6000  - Likely from chemotherapy, no overt bleeding  - 2 units RBC an d 1 unit platelets ordered from ED  - Repeat CBC post-transfusion, use neutropenic precautions    3. Lung cancer  - Currently undergoing treatment with carboplatin and etoposide as well as Cosela and Imfinzi under the care of Dr. Julien Nordmann    4. Hypokalemia  - Replace potassium, give empiric magnesium, repeat chemistries in am    5. Elevated LFTs  - Appears fairly stable, likely related to liver metastatic disease    6. Chronic HFpEF  - Appears compensated   - Monitor weights and I/Os    7. HTN  - Continue Coreg and losartan as tolerated    8. Chronic pain  - Prescription database reviewed  - Continue long- and short-acting morphine   9. Prolonged QT interval  - QTc is 518 on admission  - Replace potassium, check magnesium level, avoid QT-prolonging medications    DVT prophylaxis: SCDs  Code Status: Full  Level of Care: Level of care: Progressive Family Communication: Wife at bedside   Disposition Plan:  Patient is from: Home  Anticipated d/c is to: TBD Anticipated d/c date is: 02/27/22  Patient currently: Pending improved/stable respiratory status and improved/stable blood counts  Consults called: heme-onc  Admission status:  Inpatient     Vianne Bulls, MD [facility]  02/20/2022, 10:04 PM

## 2022-02-24 NOTE — ED Notes (Signed)
EDP notified by RN of critical results.  WBC 0.2 Hgb 5.5 Platelets 6 Absolute Neutrophils 0.1 Potassium 2.3   No new orders at this time.

## 2022-02-24 NOTE — Progress Notes (Signed)
Pharmacy Antibiotic Note  Kelly Olson. is a 72 y.o. male admitted on 02/05/2022 with pneumonia.  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 Pharmacy has been consulted for Vancomycin dosing.  Cefepime 2g IV x1 ordered in ED. WBC 0.2, ANC 0.1, Tmax 100 HR 100s, RR >20 SCr 0.81  Plan: Initiate loading dose of Vancomycin 1500mg  IV x 1, followed by  Vancomycin 750mg  IV q12h (eAUC ~459)    > Goal AUC 400-550    > Check vancomycin levels at steady state  Order MRSA PCR Monitor daily CBC, temp, SCr, and for clinical signs of improvement  F/u cultures and de-escalate antibiotics as able   Height: 6' (182.9 cm) Weight: 65.8 kg (145 lb) IBW/kg (Calculated) : 77.6  Temp (24hrs), Avg:100 F (37.8 C), Min:100 F (37.8 C), Max:100 F (37.8 C)  Recent Labs  Lab 02/21/22 1214 02/23/2022 1936 02/08/2022 2007  WBC 0.5*  --  0.2*  CREATININE 0.65 0.81  --   LATICACIDVEN  --  1.6  --     Estimated Creatinine Clearance: 77.8 mL/min (by C-G formula based on SCr of 0.81 mg/dL).    No Known Allergies  Antimicrobials this admission: Cefepime 1/26 >>  Vancomycin 1/26 >>   Dose adjustments this admission: N/A  Microbiology results: 1/26 BCx: pending 1/26 MRSA PCR: ordered  Thank you for allowing pharmacy to be a part of this patient's care.  Luisa Hart, PharmD, BCPS Clinical Pharmacist 02/16/2022 10:01 PM   Please refer to [facility] for pharmacy phone number

## 2022-02-24 NOTE — Telephone Encounter (Signed)
I received a message that the patient was assessed by home health nurse today and his oxygen was 70-75% on 4 L of oxygen.  The patient had a similar episode of hypoxia in the infusion room a few weeks ago. I called the patient and recommended he call EMS and be seen in the ER. His wife is with him. He seemed recluctant but understands our recommendation.

## 2022-02-24 NOTE — ED Provider Notes (Signed)
[facility] Provider Note   CSN: [id number] Arrival date & time: 02/19/2022  1817     History  Chief Complaint  Patient presents with   hypoxia     Kelly Olson. is a 72 y.o. male.  HPI   Patient has a history of metastatic lung cancer, coronary artery disease, ischemic cardiomyopathy, hypertension, reflux.  Patient is undergoing chemotherapy at the [facility].  Patient had an infusion of fill gastrum on January 20 for Ortho.  Patient was assessed by home health today.  They noted the patient's oxygen saturation was reading 72-76.  While on 4 L O2.  He was instructed to come to the ED for further evaluation.  Patient denies any trouble with chest pain.  He states he has not been coughing.  Denies any vomiting or diarrhea.  Home Medications Prior to Admission medications   Medication Sig Start Date End Date Taking? Authorizing Provider  albuterol (VENTOLIN HFA) 108 (90 Base) MCG/ACT inhaler Inhale 2 puffs into the lungs every 6 (six) hours as needed for shortness of breath or wheezing. 12/08/21   [provider]  aspirin 81 MG tablet Take 81 mg by mouth daily.    [provider]  atorvastatin (LIPITOR) 40 MG tablet TAKE 1 TABLET AT BEDTIME Patient taking differently: Take 40 mg by mouth at bedtime. 09/28/21   Leonie Man, MD  carvedilol (COREG) 3.125 MG tablet TAKE 2 TABLETS TWICE A DAY WITH     MEALS Patient taking differently: Take 6.25 mg by mouth 2 (two) times daily with a meal. 09/28/21   Leonie Man, MD  losartan (COZAAR) 25 MG tablet Take 1 tablet (25 mg total) by mouth daily. 01/03/22   Charlynne Cousins, MD  metFORMIN (GLUCOPHAGE-XR) 500 MG 24 hr tablet Take 500 mg by mouth 2 (two) times daily. 12/08/21   [provider]  mirtazapine (REMERON) 30 MG tablet Take 1 tablet (30 mg total) by mouth at bedtime. 02/22/22   Curt Bears, MD  morphine (MS CONTIN) 30 MG 12 hr  tablet Take 1 tablet (30 mg total) by mouth every 12 (twelve) hours. 02/14/22   Curt Bears, MD  morphine (MSIR) 15 MG tablet Take 1 tablet (15 mg total) by mouth every 6 (six) hours as needed for severe pain. 02/14/22   Curt Bears, MD  potassium chloride SA (KLOR-CON M) 20 MEQ tablet Take 1 tablet (20 mEq total) by mouth 2 (two) times daily. 02/07/22   Curt Bears, MD  Pseudoephedrine-APAP 30-325 MG TABS Take 1 tablet by mouth daily as needed (sinus congestion). 04/30/21   [provider]  sildenafil (VIAGRA) 50 MG tablet Take 1 tablet (50 mg total) by mouth daily as needed for erectile dysfunction. 02/23/16   Leonie Man, MD  sucralfate (CARAFATE) 1 g tablet Take 1 g by mouth 4 (four) times daily. 02/02/22   [provider]      Allergies    Patient has no known allergies.    Review of Systems   Review of Systems  Physical Exam Updated Vital Signs BP 128/67   Pulse (!) 101   Temp 100 F (37.8 C)   Resp (!) 21   Ht 1.829 m (6')   Wt 65.8 kg   SpO2 99%   BMI 19.67 kg/m  Physical Exam Vitals and nursing note reviewed.  Constitutional:      General: He is not in acute distress.  Appearance: He is well-developed.  HENT:     Head: Normocephalic and atraumatic.     Right Ear: External ear normal.     Left Ear: External ear normal.  Eyes:     General: No scleral icterus.       Right eye: No discharge.        Left eye: No discharge.     Conjunctiva/sclera: Conjunctivae normal.  Neck:     Trachea: No tracheal deviation.  Cardiovascular:     Rate and Rhythm: Normal rate and regular rhythm.  Pulmonary:     Effort: Pulmonary effort is normal. No respiratory distress.     Breath sounds: Normal breath sounds. No stridor. No wheezing or rales.  Abdominal:     General: Bowel sounds are normal. There is no distension.     Palpations: Abdomen is soft.     Tenderness: There is no abdominal tenderness. There is no guarding or rebound.  Musculoskeletal:         General: No tenderness or deformity.     Cervical back: Neck supple.     Right lower leg: Edema present.     Left lower leg: Edema present.  Skin:    General: Skin is warm and dry.     Findings: No rash.  Neurological:     General: No focal deficit present.     Mental Status: He is alert.     Cranial Nerves: No cranial nerve deficit, dysarthria or facial asymmetry.     Sensory: No sensory deficit.     Motor: No abnormal muscle tone or seizure activity.     Coordination: Coordination normal.  Psychiatric:        Mood and Affect: Mood normal.     ED Results / Procedures / Treatments   Labs (all labs ordered are listed, but only abnormal results are displayed) Labs Reviewed  COMPREHENSIVE METABOLIC PANEL - Abnormal; Notable for the following components:      Result Value   Potassium 2.3 (*)    Chloride 93 (*)    CO2 38 (*)    Glucose, Bld 265 (*)    BUN 24 (*)    Calcium 7.5 (*)    Total Protein 4.4 (*)    Albumin 1.6 (*)    AST 90 (*)    ALT 119 (*)    Alkaline Phosphatase 229 (*)    All other components within normal limits  PROTIME-INR - Abnormal; Notable for the following components:   Prothrombin Time 16.0 (*)    INR 1.3 (*)    All other components within normal limits  URINALYSIS, ROUTINE W REFLEX MICROSCOPIC - Abnormal; Notable for the following components:   APPearance HAZY (*)    Glucose, UA >=500 (*)    Hgb urine dipstick LARGE (*)    Protein, ur 100 (*)    Bacteria, UA FEW (*)    All other components within normal limits  BRAIN NATRIURETIC PEPTIDE - Abnormal; Notable for the following components:   B Natriuretic Peptide 697.7 (*)    All other components within normal limits  CBC WITH DIFFERENTIAL/PLATELET - Abnormal; Notable for the following components:   WBC 0.2 (*)    RBC 1.61 (*)    Hemoglobin 5.5 (*)    HCT 16.2 (*)    MCV 100.6 (*)    MCH 34.2 (*)    Platelets 6 (*)    Neutro Abs 0.1 (*)    Lymphs Abs 0.0 (*)    nRBC  25 (*)    All  other components within normal limits  CULTURE, BLOOD (ROUTINE X 2)  CULTURE, BLOOD (ROUTINE X 2)  RESP PANEL BY RT-PCR (RSV, FLU A&B, COVID)  RVPGX2  LACTIC ACID, PLASMA  APTT  LACTIC ACID, PLASMA  CBC WITH DIFFERENTIAL/PLATELET  I-STAT VENOUS BLOOD GAS, ED  TYPE AND SCREEN  ABO/RH  PREPARE RBC (CROSSMATCH)  PREPARE PLATELET PHERESIS    EKG None  Radiology DG Chest Port 1 View  Result Date: 02/17/2022 CLINICAL DATA:  Possible sepsis EXAM: PORTABLE CHEST 1 VIEW COMPARISON:  01/26/2022, CT 02/07/2022, PET CT 01/12/2022 FINDINGS: Right-sided central venous port tip at the cavoatrial region. Post sternotomy changes. Worsened bilateral left greater than right heterogeneous airspace disease. Underlying emphysema and chronic interstitial opacity. Normal cardiac size. Aortic atherosclerosis. No pneumothorax. IMPRESSION: Emphysema. Interval worsening of left greater than right heterogeneous airspace disease which may be due to edema, pneumonia, or combination of the 2, on the left this superimposes CT demonstrated nodularity at the left base, see chest CT report 02/07/2022. Electronically Signed   By: Donavan Foil M.D.   On: 02/16/2022 19:19    Procedures .Critical Care  Performed by: Dorie Rank, MD Authorized by: Dorie Rank, MD   Critical care provider statement:    Critical care time (minutes):  45   Critical care was time spent personally by me on the following activities:  Development of treatment plan with patient or surrogate, discussions with consultants, evaluation of patient's response to treatment, examination of patient, ordering and review of laboratory studies, ordering and review of radiographic studies, ordering and performing treatments and interventions, pulse oximetry, re-evaluation of patient's condition and review of old charts     Medications Ordered in ED Medications  potassium chloride 10 mEq in 100 mL IVPB (10 mEq Intravenous New Bag/Given 02/19/2022 2129)  ceFEPIme  (MAXIPIME) 2 g in sodium chloride 0.9 % 100 mL IVPB (has no administration in time range)  0.9 %  sodium chloride infusion (Manually program via Guardrails IV Fluids) (has no administration in time range)  vancomycin (VANCOREADY) IVPB 1500 mg/300 mL (has no administration in time range)  lactated ringers bolus 1,000 mL (0 mLs Intravenous Stopped 02/25/2022 2055)  potassium chloride SA (KLOR-CON M) CR tablet 40 mEq (40 mEq Oral Given 02/22/2022 2130)    ED Course/ Medical Decision Making/ A&P Clinical Course as of 01/30/2022 2153  Fri Feb 24, 2022  2020 Chest x-ray shows worsening left greater than right airspace disease which may be due to edema pneumonia or combination both [JK]  2040 Patient's platelet count is down to 6 [JK]  2040 Comprehensive metabolic panel(!!) Elevated LFTs similar to previous.  Potassium also decreased to 2.3 [JK]  2143 Case reviewed with Dr. Alvy Bimler.  Patient can stay [facility] at [facility].  Hematology oncology will follow along [JK]  2151 Case discussed with Dr. Myna Hidalgo regarding admission [JK]    Clinical Course User Index [JK] Dorie Rank, MD                             Medical Decision Making Problems Addressed: Hypokalemia: acute illness or injury that poses a threat to life or bodily functions Pancytopenia [facility]): chronic illness or injury with exacerbation, progression, or side effects of treatment Peripheral edema: acute illness or injury Pneumonia due to infectious organism, unspecified laterality, unspecified part of lung: acute illness or injury that poses a threat to life or bodily  functions  Amount and/or Complexity of Data Reviewed Labs: ordered. Decision-making details documented in ED Course. Radiology: ordered and independent interpretation performed. ECG/medicine tests: ordered.  Risk Prescription drug management. Decision regarding hospitalization.   Patient presented to the ED for evaluation of shortness of breath.  Patient  has history of multiple medical problems including metastatic lung CA.  He is undergoing chemotherapy.  Patient's ED workup notable for pancytopenia.  His x-ray also shows evidence of pneumonia versus edema.  Patient does have some peripheral edema as well on exam.  There could be a component of pulmonary edema I am concerned about possible infection with his low-grade temperature and pancytopenia.  Patient started on antibiotics.  I have also ordered blood transfusions as well as platelet transfusion.  I will consult with oncology and the medical service for admission and further treatment.        Final Clinical Impression(s) / ED Diagnoses Final diagnoses:  Hypokalemia  Pneumonia due to infectious organism, unspecified laterality, unspecified part of lung  Pancytopenia ([place])  Peripheral edema    Rx / DC Orders ED Discharge Orders     None         Dorie Rank, MD 02/01/2022 2154

## 2022-02-24 NOTE — ED Notes (Signed)
CBC w/diff lab redrawn and sent to the lab.

## 2022-02-24 NOTE — Progress Notes (Signed)
This palliative RN called pt to check in and see how he was doing. Pt reported overall that he was doing well, he has had a bowel movement and his pain is well controlled. He is feeling very weak today and reports that PT is coming to the house today for an initial visit. I also spoke to pt wife how reports that she has not heard form [employer] for pt portable concentrator. Pt wife also requests a transfer chair for the pt for safety. Order placed per NP, and RN reached out to [employer] for an update on the concentrator. Both pt and pt wife verbalize understanding to call with any questions or concerns, none at the moment.

## 2022-02-24 NOTE — Telephone Encounter (Signed)
Magda Paganini, RN, patient's home health nurse called [facility] reporting that patient's pulse ox is reading 72-76% on 4 L O2. She states that patient is not in respiratory distress, that he has good cap refill and that he has "congested" lung sounds in his L lung. This RN advised that [facility] is closing soon and we unfortunately will not be able to see patient. This RN advised that it is best for patient to go to the ER due to his low O2 saturation. Cassie, PA, made aware.

## 2022-02-24 NOTE — ED Notes (Signed)
EDP notified of critical pH and potassium. No new orders at this time.

## 2022-02-24 NOTE — Progress Notes (Signed)
                                                                                                                                                             Patient Name: Kelly Olson MRN: [id number] DOB: 06-01-1950 Referring Physician: Curt Bears (Profile Not Attached) Date of Service: 02/14/2022 [facility], [place]                                                        End Of Treatment Note  Diagnoses: C34.32-Malignant neoplasm of lower lobe, left bronchus or lung  Cancer Staging: 72 y.o.  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   Intent: Palliative  Radiation Treatment Dates: 01/27/2022 through 02/14/2022 Site Technique Total Dose (Gy) Dose per Fx (Gy) Completed Fx Beam Energies  Brain: Brain 3D 20/20 20 1/1 6XFFF  Ribs, Left: Chest_L 3D 30/30 3 10/10 6X, 10X  Hip, Right: Pelvis_R Complex 30/30 3 10/10 10X, 15X   Narrative: The patient tolerated radiation therapy relatively well with only modest fatigue and improvement in his pain towards the completion of treatment.  Plan: The patient will receive a call in about one month from the radiation oncology department. He will continue follow up with his medical oncologist, Dr. Julien Nordmann, as well. ------------------------------------------------   Tyler Pita, MD [place]: [phone]  Fax: [phone] [email or web address]  Skype  LinkedIn

## 2022-02-25 ENCOUNTER — Inpatient Hospital Stay (HOSPITAL_COMMUNITY): Payer: Medicare Other

## 2022-02-25 DIAGNOSIS — C7951 Secondary malignant neoplasm of bone: Secondary | ICD-10-CM | POA: Diagnosis not present

## 2022-02-25 DIAGNOSIS — J9601 Acute respiratory failure with hypoxia: Secondary | ICD-10-CM | POA: Diagnosis not present

## 2022-02-25 DIAGNOSIS — C383 Malignant neoplasm of mediastinum, part unspecified: Secondary | ICD-10-CM | POA: Diagnosis not present

## 2022-02-25 DIAGNOSIS — A419 Sepsis, unspecified organism: Secondary | ICD-10-CM

## 2022-02-25 DIAGNOSIS — D61818 Other pancytopenia: Secondary | ICD-10-CM | POA: Diagnosis not present

## 2022-02-25 DIAGNOSIS — E876 Hypokalemia: Secondary | ICD-10-CM | POA: Diagnosis not present

## 2022-02-25 LAB — RESP PANEL BY RT-PCR (RSV, FLU A&B, COVID)  RVPGX2
Influenza A by PCR: NEGATIVE
Influenza B by PCR: NEGATIVE
Resp Syncytial Virus by PCR: NEGATIVE
SARS Coronavirus 2 by RT PCR: NEGATIVE

## 2022-02-25 LAB — BLOOD CULTURE ID PANEL (REFLEXED) - BCID2

## 2022-02-25 LAB — CBC WITH DIFFERENTIAL/PLATELET
Abs Immature Granulocytes: 0 10*3/uL (ref 0.00–0.07)
Basophils Absolute: 0 10*3/uL (ref 0.0–0.1)
Basophils Relative: 0 %
Eosinophils Absolute: 0 10*3/uL (ref 0.0–0.5)
Eosinophils Relative: 0 %
HCT: 23.6 % — ABNORMAL LOW (ref 39.0–52.0)
Hemoglobin: 8.4 g/dL — ABNORMAL LOW (ref 13.0–17.0)
Immature Granulocytes: 0 %
Lymphocytes Relative: 6 %
Lymphs Abs: 0 10*3/uL — ABNORMAL LOW (ref 0.7–4.0)
MCH: 32.4 pg (ref 26.0–34.0)
MCHC: 35.6 g/dL (ref 30.0–36.0)
MCV: 91.1 fL (ref 80.0–100.0)
Monocytes Absolute: 0.1 10*3/uL (ref 0.1–1.0)
Monocytes Relative: 21 %
Neutro Abs: 0.2 10*3/uL — CL (ref 1.7–7.7)
Neutrophils Relative %: 73 %
Platelets: 11 10*3/uL — CL (ref 150–400)
RBC: 2.59 MIL/uL — ABNORMAL LOW (ref 4.22–5.81)
RDW: 17.7 % — ABNORMAL HIGH (ref 11.5–15.5)
WBC: 0.3 10*3/uL — CL (ref 4.0–10.5)
nRBC: 27.3 % — ABNORMAL HIGH (ref 0.0–0.2)

## 2022-02-25 LAB — BASIC METABOLIC PANEL
Anion gap: 9 (ref 5–15)
BUN: 21 mg/dL (ref 8–23)
CO2: 37 mmol/L — ABNORMAL HIGH (ref 22–32)
Calcium: 7.7 mg/dL — ABNORMAL LOW (ref 8.9–10.3)
Chloride: 100 mmol/L (ref 98–111)
Creatinine, Ser: 0.72 mg/dL (ref 0.61–1.24)
GFR, Estimated: >60 mL/min (ref >60)
Glucose, Bld: 119 mg/dL — ABNORMAL HIGH (ref 70–99)
Potassium: 3.4 mmol/L — ABNORMAL LOW (ref 3.5–5.1)
Sodium: 146 mmol/L — ABNORMAL HIGH (ref 135–145)

## 2022-02-25 LAB — CBC
HCT: 25 % — ABNORMAL LOW (ref 39.0–52.0)
Hemoglobin: 8.5 g/dL — ABNORMAL LOW (ref 13.0–17.0)
MCH: 31.8 pg (ref 26.0–34.0)
MCHC: 34 g/dL (ref 30.0–36.0)
MCV: 93.6 fL (ref 80.0–100.0)
Platelets: 11 10*3/uL — CL (ref 150–400)
RBC: 2.67 MIL/uL — ABNORMAL LOW (ref 4.22–5.81)
RDW: 18.4 % — ABNORMAL HIGH (ref 11.5–15.5)
WBC: 0.6 10*3/uL — CL (ref 4.0–10.5)
nRBC: 26.3 % — ABNORMAL HIGH (ref 0.0–0.2)

## 2022-02-25 LAB — MAGNESIUM: Magnesium: 1.6 mg/dL — ABNORMAL LOW (ref 1.7–2.4)

## 2022-02-25 LAB — GLUCOSE, CAPILLARY
Glucose-Capillary: 186 mg/dL — ABNORMAL HIGH (ref 70–99)
Glucose-Capillary: 181 mg/dL — ABNORMAL HIGH (ref 70–99)
Glucose-Capillary: 124 mg/dL — ABNORMAL HIGH (ref 70–99)
Glucose-Capillary: 78 mg/dL (ref 70–99)

## 2022-02-25 LAB — BLOOD GAS, ARTERIAL
Acid-Base Excess: 18.6 mmol/L — ABNORMAL HIGH (ref 0.0–2.0)
Bicarbonate: 43.7 mmol/L — ABNORMAL HIGH (ref 20.0–28.0)
O2 Saturation: 99.1 %
Patient temperature: 37
pCO2 arterial: 50 mmHg — ABNORMAL HIGH (ref 32–48)
pH, Arterial: 7.55 — ABNORMAL HIGH (ref 7.35–7.45)
pO2, Arterial: 100 mmHg (ref 83–108)

## 2022-02-25 LAB — COMPREHENSIVE METABOLIC PANEL
ALT: 100 U/L — ABNORMAL HIGH (ref 0–44)
AST: 60 U/L — ABNORMAL HIGH (ref 15–41)
Albumin: <1.5 g/dL — ABNORMAL LOW (ref 3.5–5.0)
Alkaline Phosphatase: 194 U/L — ABNORMAL HIGH (ref 38–126)
Anion gap: 11 (ref 5–15)
BUN: 19 mg/dL (ref 8–23)
CO2: 36 mmol/L — ABNORMAL HIGH (ref 22–32)
Calcium: 7.3 mg/dL — ABNORMAL LOW (ref 8.9–10.3)
Chloride: 98 mmol/L (ref 98–111)
Creatinine, Ser: 0.66 mg/dL (ref 0.61–1.24)
GFR, Estimated: >60 mL/min (ref >60)
Glucose, Bld: 197 mg/dL — ABNORMAL HIGH (ref 70–99)
Potassium: 2.9 mmol/L — ABNORMAL LOW (ref 3.5–5.1)
Sodium: 145 mmol/L (ref 135–145)
Total Bilirubin: 1.5 mg/dL — ABNORMAL HIGH (ref 0.3–1.2)
Total Protein: 4 g/dL — ABNORMAL LOW (ref 6.5–8.1)

## 2022-02-25 LAB — PHOSPHORUS: Phosphorus: 1.8 mg/dL — ABNORMAL LOW (ref 2.5–4.6)

## 2022-02-25 LAB — STREP PNEUMONIAE URINARY ANTIGEN
Strep Pneumo Urinary Antigen: NEGATIVE
Strep Pneumo Urinary Antigen: NEGATIVE

## 2022-02-25 LAB — MRSA NEXT GEN BY PCR, NASAL: MRSA by PCR Next Gen: NOT DETECTED

## 2022-02-25 MED ORDER — INSULIN ASPART 100 UNIT/ML IJ SOLN
0.0000 [IU] | Freq: Three times a day (TID) | INTRAMUSCULAR | Status: DC
  Administered 2022-02-25: 2 [IU] via SUBCUTANEOUS
  Administered 2022-02-25 – 2022-02-26 (×2): 3 [IU] via SUBCUTANEOUS
  Administered 2022-02-27: 2 [IU] via SUBCUTANEOUS
  Administered 2022-02-27: 3 [IU] via SUBCUTANEOUS
  Administered 2022-02-27: 2 [IU] via SUBCUTANEOUS
  Administered 2022-02-28 (×3): 3 [IU] via SUBCUTANEOUS
  Administered 2022-03-01 (×2): 2 [IU] via SUBCUTANEOUS
  Administered 2022-03-01: 3 [IU] via SUBCUTANEOUS

## 2022-02-25 MED ORDER — NALOXONE HCL 0.4 MG/ML IJ SOLN
0.4000 mg | INTRAMUSCULAR | Status: DC | PRN
  Administered 2022-02-25 – 2022-02-27 (×2): 0.4 mg via INTRAVENOUS
  Filled 2022-02-25 (×2): qty 1

## 2022-02-25 MED ORDER — TBO-FILGRASTIM 480 MCG/0.8ML ~~LOC~~ SOSY
480.0000 ug | PREFILLED_SYRINGE | Freq: Every day | SUBCUTANEOUS | Status: DC
  Administered 2022-02-25 – 2022-02-28 (×3): 480 ug via SUBCUTANEOUS
  Filled 2022-02-25 (×5): qty 0.8

## 2022-02-25 MED ORDER — LIDOCAINE-PRILOCAINE 2.5-2.5 % EX CREA: TOPICAL_CREAM | Freq: Every day | CUTANEOUS | Status: DC | PRN

## 2022-02-25 MED ORDER — FUROSEMIDE 10 MG/ML IJ SOLN: 20.0000 mg | Freq: Once | INTRAMUSCULAR | Status: DC

## 2022-02-25 MED ORDER — ALBUTEROL SULFATE (2.5 MG/3ML) 0.083% IN NEBU
2.5000 mg | INHALATION_SOLUTION | Freq: Four times a day (QID) | RESPIRATORY_TRACT | Status: DC | PRN
  Administered 2022-02-27: 2.5 mg via RESPIRATORY_TRACT
  Filled 2022-02-25: qty 3

## 2022-02-25 MED ORDER — MORPHINE SULFATE ER 15 MG PO TBCR: 15.0000 mg | EXTENDED_RELEASE_TABLET | Freq: Two times a day (BID) | ORAL | Status: DC

## 2022-02-25 MED ORDER — NALOXONE HCL 0.4 MG/ML IJ SOLN
INTRAMUSCULAR | Status: AC
  Administered 2022-02-25: 0.4 mg
  Filled 2022-02-25: qty 1

## 2022-02-25 MED ORDER — POTASSIUM CHLORIDE CRYS ER 20 MEQ PO TBCR
40.0000 meq | EXTENDED_RELEASE_TABLET | Freq: Once | ORAL | Status: AC
  Administered 2022-02-25: 40 meq via ORAL
  Filled 2022-02-25: qty 2

## 2022-02-25 NOTE — Progress Notes (Signed)
Kelly Olson.   DOB:Oct 17, 1950   SP#:[id number]    ASSESSMENT & PLAN:  Metastatic small cell lung ca He has received chemotherapy recently, complicated by neutropenic fever Continue supportive care  Neutropenic fever Cultures are pending, CXR showed findings worrisome for pneumonia Continue antibiotics Adding GCSF daily until ANC > 1.5  Acquired pancytopenia Due to chemo Agree with blood and platelet transfusion No need blood today Keep platelets >10,000  Poor venous access Recommend port access  Elevated LFT Due to recent chemo Monitor closely  Electrolyte imbalance, severe protein calorie malnutrition Replace prn, dietitian review  Goals of care Resolution of neutropenic fever   Discharge planning Likely be here 3-4 days Will follow tomorrow  All questions were answered. The patient knows to call the clinic with any problems, questions or concerns.   The total time spent in the appointment was 55 minutes encounter with patients including review of chart and various tests results, discussions about plan of care and coordination of care plan  Heath Lark, MD 02/25/2022 10:31 AM  Subjective:  Patient was admitted due to severe pancytopenia, neutropenic fever and hypoxia He is receiving chemo for extensive stage Small cell lung cancer He is quite sleepy at the time of evaluation He denies pain He was transfused with blood and platelets. CXR worrisome for PNA Has been getting GCSF in the outpatient the last few days  Objective:  Vitals:   02/25/22 0739 02/25/22 1011  BP: (!) 145/91   Pulse: (!) 109   Resp: 20   Temp: 98.7 F (37.1 C)   SpO2: 97% 94%     Intake/Output Summary (Last 24 hours) at 02/25/2022 1031 Last data filed at 02/25/2022 0950 Gross per 24 hour  Intake 1898.33 ml  Output 500 ml  Net 1398.33 ml    GENERAL: sleepy, no distress and comfortable SKIN: extensive ecchymoses are noted EYES: normal, Conjunctiva are pink and non-injected,  sclera clear OROPHARYNX:no exudate, no erythema and lips, buccal mucosa, and tongue normal  NECK: supple, thyroid normal size, non-tender, without nodularity LYMPH:  no palpable lymphadenopathy in the cervical, axillary or inguinal LUNGS: clear to auscultation and percussion with increased breathing effort HEART: mild tachycardia, regular rate & rhythm and no murmurs and no lower extremity edema ABDOMEN:abdomen soft, non-tender and normal bowel sounds Musculoskeletal:no cyanosis of digits and no clubbing. Mild cachexia  NEURO: sleepy, no focal motor/sensory deficits   Labs:  Recent Labs    02/21/22 1214 02/08/2022 1936 02/25/22 0452  NA 146* 142 145  K 3.0* 2.3* 2.9*  CL 97* 93* 98  CO2 43* 38* 36*  GLUCOSE 244* 265* 197*  BUN 24* 24* 19  CREATININE 0.65 0.81 0.66  CALCIUM 7.9* 7.5* 7.3*  GFRNONAA >60 >60 >60  PROT 5.0* 4.4* 4.0*  ALBUMIN 2.8* 1.6* <1.5*  AST 68* 90* 60*  ALT 98* 119* 100*  ALKPHOS 400* 229* 194*  BILITOT 1.1 1.0 1.5*    Studies: I reviewed CXR DG Chest Port 1 View  Result Date: 02/26/2022 CLINICAL DATA:  Possible sepsis EXAM: PORTABLE CHEST 1 VIEW COMPARISON:  01/26/2022, CT 02/07/2022, PET CT 01/12/2022 FINDINGS: Right-sided central venous port tip at the cavoatrial region. Post sternotomy changes. Worsened bilateral left greater than right heterogeneous airspace disease. Underlying emphysema and chronic interstitial opacity. Normal cardiac size. Aortic atherosclerosis. No pneumothorax. IMPRESSION: Emphysema. Interval worsening of left greater than right heterogeneous airspace disease which may be due to edema, pneumonia, or combination of the 2, on the left this superimposes CT demonstrated  nodularity at the left base, see chest CT report 02/07/2022. Electronically Signed   By: Donavan Foil M.D.   On: 02/05/2022 19:19   CT Chest W Contrast  Result Date: 02/07/2022 CLINICAL DATA:  Small-cell lung cancer staging. * Tracking Code: BO * EXAM: CT CHEST WITH  CONTRAST TECHNIQUE: Multidetector CT imaging of the chest was performed during intravenous contrast administration. RADIATION DOSE REDUCTION: This exam was performed according to the departmental dose-optimization program which includes automated exposure control, adjustment of the mA and/or kV according to patient size and/or use of iterative reconstruction technique. CONTRAST:  49mL OMNIPAQUE IOHEXOL 300 MG/ML  SOLN COMPARISON:  X-ray 01/18/2022. PET-CT 01/12/2022. CT 11/04/2021. Older exams as well. FINDINGS: Cardiovascular: Right upper chest port identified. Slightly atrophic appearing internal jugular vein. Status post median sternotomy. The heart is nonenlarged. No significant pericardial effusion. The thoracic aorta has a normal course and caliber with scattered atherosclerotic plaque. Minimal focal ectasia along the aortic arch. Bovine type aortic arch identified. Coronary artery calcifications are noted. Mediastinum/Nodes: There is no specific abnormal lymph node enlargement seen in the axillary region, hilum or mediastinum. Normal caliber thoracic esophagus. Slightly heterogeneous thyroid gland. Lungs/Pleura: Advanced emphysematous lung changes identified, centrilobular diffusely seen. There is a tiny left pleural effusion greater than right. Areas of interstitial septal thickening identified and scarring/fibrosis. Patient is status post right-sided lobectomy lower lobe. There appears to be some asymmetric parenchymal opacity along the left lower lobe and dependent lingula. The lingular areas are increased from previous. Lower lobe is similar. Previously there were some small lung nodules. This includes a left lower lobe nodule measuring 8 x 9 mm on the prior and today this same nodule on image 108 of series 7 measures 10 x 12 mm. The more dependent areas in the left lower lobe towards the diaphragm are poorly seen with the increasing opacity which has been developing since the prior standard CT scan of  October 2023. One areas somewhat nodular in this location as measured on the prior PET-CT scan. On the PET-CT this was measured at 15 mm and today measured in similar fashion on series 7, image 114 measures 17 mm. Upper Abdomen: Along the upper abdomen the adrenal glands are diffusely thickened bilaterally. Please correlate for history. Hypertrophy is possible versus a lesion. This is significantly increased from previous. Fatty liver infiltration identified. Musculoskeletal: Mild degenerative changes seen along the spine. If there is concern of osseous metastatic disease, bone scan can be performed as clinically directed. Anasarca. IMPRESSION: Left lower lobe areas of nodularity appear to be slightly increasing from previous examinations. Some of these areas are partially obscured by the increasing parenchymal opacities in the lingula and left lower lobe. Extensive emphysematous lung changes as well as areas of scarring and fibrotic change. Tiny bilateral pleural effusions are identified. Left is similar to previous. The right is increasing. Chest port. No developing new lymph node enlargement. In the upper abdomen the adrenal glands today are diffusely thickened compared to the previous examinations and this is a new finding from October 2023 and December 2023. Please correlate with any known history or clinical evidence of adrenal abnormality. Otherwise additional workup with MRI with and without contrast may be of benefit as the next step in the workup. Aortic Atherosclerosis (ICD10-I70.0) and Emphysema (ICD10-J43.9). Electronically Signed   By: Jill Side M.D.   On: 02/07/2022 18:50   DG Chest Portable 1 View  Result Date: 01/26/2022 CLINICAL DATA:  Hypoxia, lung cancer EXAM: PORTABLE CHEST 1 VIEW  COMPARISON:  12/14/2021 FINDINGS: There is progressive consolidation within the left lung base initial may be infectious or inflammatory as can be seen with post radiation change in the appropriate clinical  setting. Small bilateral pleural effusions are present. No pneumothorax. Right internal jugular chest port tip noted within the superior vena cava. Coronary artery bypass grafting has been performed. Cardiac size within normal limits. Pulmonary vascularity is normal. No acute bone abnormality. IMPRESSION: 1. Progressive consolidation within the left lung base, possibly infectious or inflammatory as can be seen with post radiation change in the appropriate clinical setting. Electronically Signed   By: Fidela Salisbury M.D.   On: 01/26/2022 18:57

## 2022-02-25 NOTE — Progress Notes (Signed)
PROGRESS NOTE    Kelly Olson.  LOV:[id number] DOB: 10-17-1950 DOA: 02/05/2022 PCP: Wenda Low, MD    Brief Narrative:  Kelly Olson. is a pleasant 72 y.o. male with medical history significant for CAD, ischemic cardiomyopathy with recovered EF, recurrent/metastatic small cell lung cancer, chronic pain, and chronic hypoxic respiratory failure who presents emergency department after he was found by home health to have oxygen saturation in the 15s.   Patient was admitted to the hospital 1 month ago with acute hypoxic respiratory failure due to pneumonia and COPD exacerbation.  He was discharged with 2 L/min of supplemental oxygen which he had been using ever since.  He had been feeling more short of breath the last couple days and turned his oxygen up to 3 L/min.  He was seen by home health today who noted his oxygen saturation to be in the 60s despite supplemental oxygen.    Assessment and Plan: Pneumonia; acute hypoxic respiratory failure  - Blood cultures collected in ED and empiric antibiotics started  -  strep pneumo negative and legionella antigens pending, continue antibiotics, continue supplemental O2, follow cultures and clinical course   -may need ABG +/- BIPAP pending course   Pancytopenia - On admission, WBC 200 with ANC 140 and ALC 10; Hgb 5.5; platelets 6000  - Likely from chemotherapy, no overt bleeding  - 2 units RBC and 1 unit platelets ordered from ED  - oncology consulted    Lung cancer  - Currently undergoing treatment with carboplatin and etoposide as well as Cosela and Imfinzi under the care of Dr. Julien Nordmann   -Dr. Alvy Bimler to see    Hypokalemia  - Repelte    Elevated LFTs  - Appears fairly stable, likely related to liver metastatic disease      Chronic HFpEF  - Appears compensated   - Monitor weights and I/Os   -may need PRN lasix (BNP slightly elevated)   HTN  - Continue Coreg and losartan as tolerated     Chronic pain  -  Prescription database reviewed  - Continue long- and short-acting morphine- will lower dose   Prolonged QT interval  -  avoid QT-prolonging medications      DVT prophylaxis: SCDs Start: 02/11/2022 2202    Code Status: Full Code Family Communication: wife at bedside  Disposition Plan:  Level of care: Progressive Status is: Inpatient Remains inpatient appropriate because: sick    Consultants:  oncology   Subjective: On NRB, sleepy but will awaken and answer simple questions  Objective: Vitals:   02/25/22 0736 02/25/22 0739 02/25/22 1011 02/25/22 1122  BP:  (!) 145/91  120/78  Pulse:  (!) 109  98  Resp:  20  19  Temp:  98.7 F (37.1 C)  (!) 97.4 F (36.3 C)  TempSrc:  Oral  Oral  SpO2: 99% 97% 94% 92%  Weight:      Height:        Intake/Output Summary (Last 24 hours) at 02/25/2022 1135 Last data filed at 02/25/2022 8841 Gross per 24 hour  Intake 1898.33 ml  Output 500 ml  Net 1398.33 ml   Filed Weights   02/11/2022 1829  Weight: 65.8 kg    Examination:   General: Appearance:    Frail male in no acute distress   Multiple areas of bruising on arms  Lungs:     Diminished, on NRB respirations unlabored  Heart:    Normal heart rate.   MS:   All extremities  are intact.    Neurologic:   Sleepy but will awaken and answer simple questions        Data Reviewed: I have personally reviewed following labs and imaging studies  CBC: Recent Labs  Lab 02/21/22 1214 02/14/2022 2007 02/25/22 0452  WBC 0.5* 0.2* 0.3*  NEUTROABS 0.4* 0.1* 0.2*  HGB 8.9* 5.5* 8.4*  HCT 26.9* 16.2* 23.6*  MCV 101.1* 100.6* 91.1  PLT 34* 6* 11*   Basic Metabolic Panel: Recent Labs  Lab 02/21/22 1214 02/23/2022 1936 02/25/22 0452  NA 146* 142 145  K 3.0* 2.3* 2.9*  CL 97* 93* 98  CO2 43* 38* 36*  GLUCOSE 244* 265* 197*  BUN 24* 24* 19  CREATININE 0.65 0.81 0.66  CALCIUM 7.9* 7.5* 7.3*   GFR: Estimated Creatinine Clearance: 78.8 mL/min (by C-G formula based on SCr of 0.66  mg/dL). Liver Function Tests: Recent Labs  Lab 02/21/22 1214 02/23/2022 1936 02/25/22 0452  AST 68* 90* 60*  ALT 98* 119* 100*  ALKPHOS 400* 229* 194*  BILITOT 1.1 1.0 1.5*  PROT 5.0* 4.4* 4.0*  ALBUMIN 2.8* 1.6* <1.5*   No results for input(s): "LIPASE", "AMYLASE" in the last 168 hours. No results for input(s): "AMMONIA" in the last 168 hours. Coagulation Profile: Recent Labs  Lab 02/01/2022 1936  INR 1.3*   Cardiac Enzymes: No results for input(s): "CKTOTAL", "CKMB", "CKMBINDEX", "TROPONINI" in the last 168 hours. BNP (last 3 results) No results for input(s): "PROBNP" in the last 8760 hours. HbA1C: No results for input(s): "HGBA1C" in the last 72 hours. CBG: Recent Labs  Lab 02/02/2022 2313 02/25/22 0921 02/25/22 1118  GLUCAP 206* 186* 181*   Lipid Profile: No results for input(s): "CHOL", "HDL", "LDLCALC", "TRIG", "CHOLHDL", "LDLDIRECT" in the last 72 hours. Thyroid Function Tests: No results for input(s): "TSH", "T4TOTAL", "FREET4", "T3FREE", "THYROIDAB" in the last 72 hours. Anemia Panel: No results for input(s): "VITAMINB12", "FOLATE", "FERRITIN", "TIBC", "IRON", "RETICCTPCT" in the last 72 hours. Sepsis Labs: Recent Labs  Lab 02/17/2022 1936 02/13/2022 2029  LATICACIDVEN 1.6 1.3    Recent Results (from the past 240 hour(s))  Blood Culture (routine x 2)     Status: None (Preliminary result)   Collection Time: 02/20/2022  7:15 PM   Specimen: BLOOD  Result Value Ref Range Status   Specimen Description BLOOD SITE NOT SPECIFIED  Final   Special Requests   Final    BOTTLES DRAWN AEROBIC AND ANAEROBIC Blood Culture results may not be optimal due to an excessive volume of blood received in culture bottles   Culture   Final    NO GROWTH < 12 HOURS Performed at [facility], [place] [address] [place], [place] [address]    Report Status PENDING  Incomplete  Blood Culture (routine x 2)     Status: None (Preliminary result)   Collection Time: 02/09/2022  7:30 PM    Specimen: BLOOD  Result Value Ref Range Status   Specimen Description BLOOD SITE NOT SPECIFIED  Final   Special Requests   Final    BOTTLES DRAWN AEROBIC AND ANAEROBIC Blood Culture results may not be optimal due to an excessive volume of blood received in culture bottles   Culture   Final    NO GROWTH < 12 HOURS Performed at [facility], [place] [address], [place], [place] [address]    Report Status PENDING  Incomplete  Resp panel by RT-PCR (RSV, Flu A&B, Covid) Anterior Nasal Swab     Status: None  Collection Time: 02/25/22  8:59 AM   Specimen: Anterior Nasal Swab  Result Value Ref Range Status   SARS Coronavirus 2 by RT PCR NEGATIVE NEGATIVE Final    Comment: (NOTE) SARS-CoV-2 target nucleic acids are NOT DETECTED.  The SARS-CoV-2 RNA is generally detectable in upper respiratory specimens during the acute phase of infection. The lowest concentration of SARS-CoV-2 viral copies this assay can detect is 138 copies/mL. A negative result does not preclude SARS-Cov-2 infection and should not be used as the sole basis for treatment or other patient management decisions. A negative result may occur with  improper specimen collection/handling, submission of specimen other than nasopharyngeal swab, presence of viral mutation(s) within the areas targeted by this assay, and inadequate number of viral copies(<138 copies/mL). A negative result must be combined with clinical observations, patient history, and epidemiological information. The expected result is Negative.  Fact Sheet for Patients:  EntrepreneurPulse.com.au  Fact Sheet for Healthcare Providers:  IncredibleEmployment.be  This test is no t yet approved or cleared by the [place] FDA and  has been authorized for detection and/or diagnosis of SARS-CoV-2 by FDA under an Emergency Use Authorization (EUA). This EUA will remain  in effect (meaning this test can be used) for the duration  of the COVID-19 declaration under Section 564(b)(1) of the Act, 21 U.S.C.section 360bbb-3(b)(1), unless the authorization is terminated  or revoked sooner.       Influenza A by PCR NEGATIVE NEGATIVE Final   Influenza B by PCR NEGATIVE NEGATIVE Final    Comment: (NOTE) The Xpert Xpress SARS-CoV-2/FLU/RSV plus assay is intended as an aid in the diagnosis of influenza from Nasopharyngeal swab specimens and should not be used as a sole basis for treatment. Nasal washings and aspirates are unacceptable for Xpert Xpress SARS-CoV-2/FLU/RSV testing.  Fact Sheet for Patients: EntrepreneurPulse.com.au  Fact Sheet for Healthcare Providers: IncredibleEmployment.be  This test is not yet approved or cleared by the [place] FDA and has been authorized for detection and/or diagnosis of SARS-CoV-2 by FDA under an Emergency Use Authorization (EUA). This EUA will remain in effect (meaning this test can be used) for the duration of the COVID-19 declaration under Section 564(b)(1) of the Act, 21 U.S.C. section 360bbb-3(b)(1), unless the authorization is terminated or revoked.     Resp Syncytial Virus by PCR NEGATIVE NEGATIVE Final    Comment: (NOTE) Fact Sheet for Patients: EntrepreneurPulse.com.au  Fact Sheet for Healthcare Providers: IncredibleEmployment.be  This test is not yet approved or cleared by the [place] FDA and has been authorized for detection and/or diagnosis of SARS-CoV-2 by FDA under an Emergency Use Authorization (EUA). This EUA will remain in effect (meaning this test can be used) for the duration of the COVID-19 declaration under Section 564(b)(1) of the Act, 21 U.S.C. section 360bbb-3(b)(1), unless the authorization is terminated or revoked.  Performed at [facility], [place] [address], [place], [place] [address]   MRSA Next Gen by PCR, Nasal     Status: None   Collection Time:  02/25/22  8:59 AM  Result Value Ref Range Status   MRSA by PCR Next Gen NOT DETECTED NOT DETECTED Final    Comment: (NOTE) The GeneXpert MRSA Assay (FDA approved for NASAL specimens only), is one component of a comprehensive MRSA colonization surveillance program. It is not intended to diagnose MRSA infection nor to guide or monitor treatment for MRSA infections. Test performance is not FDA approved in patients less than 45 years old. Performed at [facility]  Lab, [address] [place], [place] [address]          Radiology Studies: DG Chest Port 1 View  Result Date: 02/23/2022 CLINICAL DATA:  Possible sepsis EXAM: PORTABLE CHEST 1 VIEW COMPARISON:  01/26/2022, CT 02/07/2022, PET CT 01/12/2022 FINDINGS: Right-sided central venous port tip at the cavoatrial region. Post sternotomy changes. Worsened bilateral left greater than right heterogeneous airspace disease. Underlying emphysema and chronic interstitial opacity. Normal cardiac size. Aortic atherosclerosis. No pneumothorax. IMPRESSION: Emphysema. Interval worsening of left greater than right heterogeneous airspace disease which may be due to edema, pneumonia, or combination of the 2, on the left this superimposes CT demonstrated nodularity at the left base, see chest CT report 02/07/2022. Electronically Signed   By: Donavan Foil M.D.   On: 02/15/2022 19:19        Scheduled Meds:  carvedilol  6.25 mg Oral BID WC   insulin aspart  0-15 Units Subcutaneous TID WC   insulin aspart  0-5 Units Subcutaneous QHS   losartan  25 mg Oral Daily   morphine  30 mg Oral Q12H   potassium chloride SA  20 mEq Oral BID   potassium chloride  40 mEq Oral Once   sodium chloride flush  3 mL Intravenous Q12H   sucralfate  1 g Oral QID   Tbo-filgastrim (GRANIX) SQ  480 mcg Subcutaneous q1800   Continuous Infusions:  cefTRIAXone (ROCEPHIN)  IV Stopped (02/19/2022 2309)   doxycycline (VIBRAMYCIN) IV 100 mg (02/25/22 0950)   magnesium sulfate  bolus IVPB     vancomycin 750 mg (02/25/22 0931)     LOS: 1 day    Time spent: 45 minutes spent on chart review, discussion with nursing staff, consultants, updating family and interview/physical exam; more than 50% of that time was spent in counseling and/or coordination of care.    Geradine Girt, DO [facility] Available via Epic secure chat 7am-7pm After these hours, please refer to coverage provider listed on [email or web address] 02/25/2022, 11:35 AM

## 2022-02-25 NOTE — Consult Note (Signed)
NAME:  Kelly Harriman., MRN:  [id number], DOB:  12-27-1950, LOS: 1 ADMISSION DATE:  02/08/2022, CONSULTATION DATE:  02/25/22 REFERRING MD:  Dr. 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  Denied recent fever, chills, N/V.  No change in his chronic bilateral LE edema, bruising more but no bleeding.  Admitted one month ago with PNA and AECOPD discharged on new home O2 2L.    On evaluation, found with SIRS, CXR c/w emphysema and L> R airspace disease with K 2.3, bicarb 38, elevated LFTs, BNP 689, Hgb 5.5, plts 6, WBC 0.3.  Oncology following.  1/27 with worsening respiratory distress, increasing O2 needs, and decreasing mental status, PCCM consulted further recs.  Patient complains of mild SOB, denies N/V or abd pain, mild neck discomfort.   Pertinent  Medical History   Past Medical History:  Diagnosis Date   Coronary artery disease, occlusive 06/02/2014   Multivessel CAD.mLAD-100%, mRCA 99%, dRCA 100%, mLCX 90%  And EF 35-45%. -->  Referred for CABG; nonischemic Myoview March 2020   Former heavy tobacco smoker    Quit in April 2016    GERD (gastroesophageal reflux disease)    Hyperlipidemia with target LDL less than 70    Hypertension    Ischemic cardiomyopathy - resolved 05/2014   Myoview: EF ~33% with "infarction vs. severe resting ischemia in LAD & RCA territory; b) EF by Cath: 35-45%. 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   Stage T2 a, N0   Significant Hospital Events: Including procedures, antibiotic start and stop dates in addition to other pertinent events   1/26 admitted [facility] sepsis, PNA, pancytopenia, neutropenic fever  Interim History / Subjective:    Objective   Blood pressure 117/73, pulse 98, temperature 99.9 F (37.7 C), temperature source Axillary, resp. rate (!) 32, height 6' (1.829 m), weight 65.8 kg, SpO2 92 %.        Intake/Output Summary (Last 24 hours) at 02/25/2022 1728 Last data filed at 02/25/2022 8099 Gross per 24 hour  Intake 1898.33 ml  Output 500 ml  Net 1398.33 ml   Filed Weights   02/10/2022 1829  Weight: 65.8 kg   Examination: General:  AoC ill appearing male sitting upright in bed in mild distress HEENT: MM pink/dry, pupils 3/reactive Neuro: appears tired but able to sustain conversation, oriented x3, MAE-generalized weakness CV: rr, borderline NSR/ ST, +dp pulses, port right chest> not accessed PULM:  some abd WOB/ tachypnea ~30s, diffuse rales/ rhonchi, congested cough GI: soft, bs+, NT, male purwick Extremities: warm/dry, +2-3 pitting LE edema  Skin: petechial rash to abd/ chest, scattered ecchymosis to BUE, healing abrasions/ ?excoriations to R ankle  Resolved Hospital Problem list    Assessment & Plan:   Acute on chronic hypoxic respiratory failure  PNA,  CAP +/- aspiration Emphysema  - ok  to remain in PCU for now - change from [place] to [facility] for more support with WOB for now.  Remains full code with ongoing [place] - ABG 7.55/ 50/ 100/ 43.7 - CXR > worsening RLL  - abx as below - some concern for dysphagia s/p XRT.  SLP consult - NPO for now except meds  - aspiration precautions - hold on lasix for now - caution w/ sedating meds/ morphine (narcan prn) - send urine for strep and legionella  - send sputum cx if able to produce  - prn BD (not on home Bds) - pulm hygiene> IS/ PT as able   Sepsis secondary to PNA and E. 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 Julien Nordmann, on carboplatin, etoposide, cosela, imfinzi s/p XRT brain - oncology following - morphine prn w/ prn narcan - primary team re-consulting PMT for ongoing [place]  Acquired pancytopenia 2/2 chemo - trend CBC - goal plts >10k - transfuse prn for Hgb < 7 - adding GCSF till [place] > 1.5   Hypokalemia  - s/p replete, recheck BMET pending  - check Mag  - replete electrolytes prn   Elevated LFTs - suspected from chemo - trend   Prolonged Qtc - tele monitoring - avoid Qtc prolonging meds - maximize K/ Mag  HTN HFpEF - continue coreg and losartan for now> monitor closely for worsening hypotension with GNR bacteremia  DMT - SSI  Protein calorie malnutrition - recs for RD consult  Best Practice (right click and "Reselect all SmartList Selections" daily)   Diet/type: NPO w/ oral meds DVT prophylaxis: SCD GI prophylaxis: PPI Lines: N/A Foley:  N/A Code Status:  full code Last date of multidisciplinary goals of care discussion [1/26]  Labs   CBC: Recent Labs  Lab 02/21/22 1214 02/15/2022 2007 02/25/22 0452  WBC 0.5* 0.2* 0.3*  NEUTROABS 0.4* 0.1* 0.2*  HGB 8.9* 5.5* 8.4*  HCT 26.9* 16.2* 23.6*  MCV 101.1* 100.6* 91.1  PLT 34* 6* 11*    Basic Metabolic Panel: Recent Labs  Lab 02/21/22 1214 02/01/2022 1936 02/25/22 0452  NA 146* 142 145  K 3.0* 2.3* 2.9*  CL 97* 93* 98  CO2 43* 38* 36*  GLUCOSE 244* 265* 197*  BUN 24* 24* 19  CREATININE 0.65 0.81 0.66  CALCIUM 7.9* 7.5* 7.3*   GFR: Estimated Creatinine Clearance: 78.8 mL/min (by C-G formula based on SCr of 0.66 mg/dL). Recent Labs  Lab 02/21/22 1214 02/23/2022 1936 02/17/2022 2007 02/12/2022 2029 02/25/22 0452  WBC 0.5*  --  0.2*  --  0.3*  LATICACIDVEN  --  1.6  --  1.3  --     Liver Function Tests: Recent Labs   Lab 02/21/22 1214 01/31/2022 1936 02/25/22 0452  AST 68* 90* 60*  ALT 98* 119* 100*  ALKPHOS 400* 229* 194*  BILITOT 1.1 1.0 1.5*  PROT 5.0* 4.4* 4.0*  ALBUMIN 2.8* 1.6* <1.5*   No results for input(s): "LIPASE", "AMYLASE" in the last 168 hours. No results for input(s): "AMMONIA" in the last 168 hours.  ABG    Component Value Date/Time   PHART 7.423 07/24/2018 1014   PCO2ART 39.2 07/24/2018 1014   PO2ART 80.4 (L) 07/24/2018 1014   HCO3 25.2 07/24/2018 1014   TCO2 23 06/05/2014 1652   ACIDBASEDEF 2.0 06/04/2014 1851   O2SAT 95.5  07/24/2018 1014     Coagulation Profile: Recent Labs  Lab 02/07/2022 1936  INR 1.3*    Cardiac Enzymes: No results for input(s): "CKTOTAL", "CKMB", "CKMBINDEX", "TROPONINI" in the last 168 hours.  HbA1C: Hgb A1c MFr Bld  Date/Time Value Ref Range Status  01/28/2022 03:30 AM 6.6 (H) 4.8 - 5.6 % Final    Comment:    (NOTE)         Prediabetes: 5.7 - 6.4         Diabetes: >6.4         Glycemic control for adults with diabetes: <7.0   06/03/2014 02:26 PM 6.0 (H) 4.8 - 5.6 % Final    Comment:    (NOTE)         Pre-diabetes: 5.7 - 6.4         Diabetes: >6.4         Glycemic control for adults with diabetes: <7.0     CBG: Recent Labs  Lab 02/19/2022 2313 02/25/22 0921 02/25/22 1118  GLUCAP 206* 186* 181*    Review of Systems:   As per HPI   Past Medical History:  He,  has a past medical history of Coronary artery disease, occlusive (06/02/2014), Former heavy tobacco smoker, GERD (gastroesophageal reflux disease), Hyperlipidemia with target LDL less than 70, Hypertension, Ischemic cardiomyopathy - resolved (05/2014), S/P CABG x 4 (06/04/2014), and Squamous cell lung cancer, 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   Surgical History:   Past Surgical History:  Procedure Laterality Date   CARDIAC CATHETERIZATION N/A 06/02/2014   Procedure: Left Heart Cath and Coronary Angiography;  Surgeon: Leonie Man, MD;  Location: [facility] INVASIVE CV  LAB CUPID;  Service: Cardiovascular;  mLAD-100%, mRCA 99%, dRCA 100%, mLCX 90%  And EF 35-45%.   CORONARY ARTERY BYPASS GRAFT N/A 06/04/2014   Procedure: CORONARY ARTERY BYPASS GRAFTING (CABG) times four using bilateral internal mammary arteries and EVH for left  leg saphenous vein;  Surgeon: Gaye Pollack, MD;  Location: [place] OR;  Service: Open Heart Surgery;  LIMA-OM, RIMA-LAD, SVG-Diag, SVG-rPDA   IR IMAGING GUIDED PORT INSERTION  05/16/2021   NM MYOVIEW LTD  05/28/2014   Pre-CABG:  High Risk Nuclear Stress Test with multivessel distribution ischemia.  -- Severely ischemic Cardiomyoapthy with evidence of at least 2 vessel disease & EF of ~33%.  Images are consistent with either infarction or severe resting ischemia in the LAD & RCA territory.   NM MYOVIEW LTD  03/2018   EF 50-55%.  Small basal inferior fixed defect consistent with diaphragmatic attenuation.  No ischemia or infarction.  Septal dyskinesis due to post CABG state   TEE WITHOUT CARDIOVERSION N/A 06/04/2014   Procedure: TRANSESOPHAGEAL ECHOCARDIOGRAM (TEE);  Surgeon: Gaye Pollack, MD;  Location: [place];  Service: Open Heart Surgery;  Laterality: N/A;   THORACOTOMY Right 07/10/2016   Procedure: THORACOTOMY;  Surgeon: Gaye Pollack, MD;  Location: [facility] OR;  Service: Thoracic;  Laterality: Right;   THORACOTOMY/LOBECTOMY Right 07/29/2018   Procedure: THORACOTOMY/RIGHT LOWER LOBECTOMY;  Surgeon: Gaye Pollack, MD;  Location: [place];  Service: Thoracic;  Laterality: Right;   [place] ECHOCARDIOGRAM  08/10/2021   EF 60 to 65%.  GR 1 DD.  No RWMA.  Normal RV size and function.  Normal PAP.  Normal aortic and mitral valves.  Normal RAP.  Conclusion(s)/Recommendation(s): Normal biventricular function withoutevidence of hemodynamically significant valvular heart disease. . EF 50-55% with mild HK of basal anteroseptal  myocardium. GR 1 DD.   VIDEO ASSISTED THORACOSCOPY (VATS)/WEDGE RESECTION Right 07/10/2016    Procedure: VIDEO ASSISTED THORACOSCOPY (VATS)/WEDGE RESECTION;  Surgeon: Gaye Pollack, MD;  Location: [place] OR;  Service: Thoracic;  Laterality: Right;   VIDEO BRONCHOSCOPY WITH ENDOBRONCHIAL NAVIGATION N/A 04/13/2021   Procedure: VIDEO BRONCHOSCOPY WITH ENDOBRONCHIAL NAVIGATION;  Surgeon: Melrose Nakayama, MD;  Location: [place];  Service: Thoracic;  Laterality: N/A;     Social History:   reports that he quit smoking about 7 years ago. His smoking use included cigarettes. He has a 36.80 pack-year smoking history. He has never used smokeless tobacco. He reports current alcohol use of about 7.0 standard drinks of alcohol per week. He reports that he does not use drugs.   Family History:  His family history includes Cardiomyopathy in his father; Emphysema in his mother; Healthy in his sister and sister; Heart attack in his maternal grandfather; Heart disease in his paternal uncle.   Allergies No Known Allergies   Home Medications  Prior to Admission medications   Medication Sig Start Date End Date Taking? Authorizing Provider  albuterol (VENTOLIN HFA) 108 (90 Base) MCG/ACT inhaler Inhale 2 puffs into the lungs every 6 (six) hours as needed for shortness of breath or wheezing. 12/08/21   [provider]  aspirin 81 MG tablet Take 81 mg by mouth daily.    [provider]  atorvastatin (LIPITOR) 40 MG tablet TAKE 1 TABLET AT BEDTIME Patient taking differently: Take 40 mg by mouth at bedtime. 09/28/21   Leonie Man, MD  carvedilol (COREG) 3.125 MG tablet TAKE 2 TABLETS TWICE A DAY WITH     MEALS Patient taking differently: Take 6.25 mg by mouth 2 (two) times daily with a meal. 09/28/21   Leonie Man, MD  losartan (COZAAR) 25 MG tablet Take 1 tablet (25 mg total) by mouth daily. 01/03/22   Charlynne Cousins, MD  metFORMIN (GLUCOPHAGE-XR) 500 MG 24 hr tablet Take 500 mg by mouth 2 (two) times daily. 12/08/21   [provider]  mirtazapine (REMERON) 30 MG tablet  Take 1 tablet (30 mg total) by mouth at bedtime. 02/22/22   Curt Bears, MD  morphine (MS CONTIN) 30 MG 12 hr tablet Take 1 tablet (30 mg total) by mouth every 12 (twelve) hours. 02/14/22   Curt Bears, MD  morphine (MSIR) 15 MG tablet Take 1 tablet (15 mg total) by mouth every 6 (six) hours as needed for severe pain. 02/14/22   Curt Bears, MD  potassium chloride SA (KLOR-CON M) 20 MEQ tablet Take 1 tablet (20 mEq total) by mouth 2 (two) times daily. 02/07/22   Curt Bears, MD  Pseudoephedrine-APAP 30-325 MG TABS Take 1 tablet by mouth daily as needed (sinus congestion). 04/30/21   [provider]  sildenafil (VIAGRA) 50 MG tablet Take 1 tablet (50 mg total) by mouth daily as needed for erectile dysfunction. 02/23/16   Leonie Man, MD  sucralfate (CARAFATE) 1 g tablet Take 1 g by mouth 4 (four) times daily. 02/02/22   [provider]         Kennieth Rad, [facility] 02/25/2022, 5:28 PM  See Amion for pager If no response to pager, please call PCCM consult pager After 7:00 pm call Elink

## 2022-02-25 NOTE — Progress Notes (Signed)
RT called to patient room due to patient having ABG ordered.  Upon arrival, patient was noted to have increased work of breathing and sats of 92% on 10L [place] (had been increased from 3L per RN).  Two RTs attempted ABG on patient however both unsuccessful.  MD was able to obtain sample which was sent down to main lab.  Decision was made to place patient on heated high flow which patient is tolerating well at this time.  Will continue to monitor.

## 2022-02-25 NOTE — Progress Notes (Signed)
PHARMACY - PHYSICIAN COMMUNICATION CRITICAL VALUE ALERT - BLOOD CULTURE IDENTIFICATION (BCID)  Kelly Aker. is an 72 y.o. male who presented to [facility] on 02/11/2022 with a chief complaint of pneumonia.   Assessment:  1 out of 4 (anaerobic bottle) growing GNRs, BCID identifying as Ecoli (no resistance detected).   Name of physician (or Provider) Contacted: Eulogio Bear, DO  Current antibiotics:  Doxycyline Ceftriaxone Vancomycin   Changes to prescribed antibiotics recommended:  Discontinue vancomycin given MRSA PCR (-) and continue doxy/ceftriaxone until sputum cultures result.  Results for orders placed or performed during the hospital encounter of 01/30/2022  Blood Culture ID Panel (Reflexed) (Collected: 02/26/2022  7:15 PM)  Result Value Ref Range   Enterococcus faecalis NOT DETECTED NOT DETECTED   Enterococcus Faecium NOT DETECTED NOT DETECTED   Listeria monocytogenes NOT DETECTED NOT DETECTED   Staphylococcus species NOT DETECTED NOT DETECTED   Staphylococcus aureus (BCID) NOT DETECTED NOT DETECTED   Staphylococcus epidermidis NOT DETECTED NOT DETECTED   Staphylococcus lugdunensis NOT DETECTED NOT DETECTED   Streptococcus species NOT DETECTED NOT DETECTED   Streptococcus agalactiae NOT DETECTED NOT DETECTED   Streptococcus pneumoniae NOT DETECTED NOT DETECTED   Streptococcus pyogenes NOT DETECTED NOT DETECTED   A.calcoaceticus-baumannii NOT DETECTED NOT DETECTED   Bacteroides fragilis NOT DETECTED NOT DETECTED   Enterobacterales DETECTED (A) NOT DETECTED   Enterobacter cloacae complex NOT DETECTED NOT DETECTED   Escherichia coli DETECTED (A) NOT DETECTED   Klebsiella aerogenes NOT DETECTED NOT DETECTED   Klebsiella oxytoca NOT DETECTED NOT DETECTED   Klebsiella pneumoniae NOT DETECTED NOT DETECTED   Proteus species NOT DETECTED NOT DETECTED   Salmonella species NOT DETECTED NOT DETECTED   Serratia marcescens NOT DETECTED NOT DETECTED   Haemophilus influenzae  NOT DETECTED NOT DETECTED   Neisseria meningitidis NOT DETECTED NOT DETECTED   Pseudomonas aeruginosa NOT DETECTED NOT DETECTED   Stenotrophomonas maltophilia NOT DETECTED NOT DETECTED   Candida albicans NOT DETECTED NOT DETECTED   Candida auris NOT DETECTED NOT DETECTED   Candida glabrata NOT DETECTED NOT DETECTED   Candida krusei NOT DETECTED NOT DETECTED   Candida parapsilosis NOT DETECTED NOT DETECTED   Candida tropicalis NOT DETECTED NOT DETECTED   Cryptococcus neoformans/gattii NOT DETECTED NOT DETECTED   CTX-M ESBL NOT DETECTED NOT DETECTED   Carbapenem resistance IMP NOT DETECTED NOT DETECTED   Carbapenem resistance KPC NOT DETECTED NOT DETECTED   Carbapenem resistance NDM NOT DETECTED NOT DETECTED   Carbapenem resist OXA 48 LIKE NOT DETECTED NOT DETECTED   Carbapenem resistance VIM NOT DETECTED NOT DETECTED    Billey Gosling, PharmD PGY1 Pharmacy Resident 1/27/20241:32 PM

## 2022-02-25 NOTE — Progress Notes (Signed)
Weaned patient O2 to 4 L HFNC, patient came increasing lethargic. Md Paged, New orders received, MD came to bedside. RR notified and present at bedside

## 2022-02-25 NOTE — Significant Event (Signed)
Rapid Response Event Note   Reason for Call :  Increased O2 need  Initial Focused Assessment:  Per MD patient was somnolent with periods of apnea.  2 doses of Narcan given IV.  He had received 30mg  MS contin about 10am.  Upon my arrival he is drowsy but conversant.  He has labored breathing, using abdominal muscles. Lung sounds with crackles in bases Heart tones occasionally irregular  BP 117/73  SR/ST with PAC 90-100s  RR 24-35  O2 sat 93% on 10L [place]    Interventions:  ABG 7.55/50/100/43 PCXR CCM consulted  Placed on Heated High Flow [place]    Plan of Care:  RN to call if patient becomes more labored or desats   Event Summary:   MD Notified: Dr Eliseo Squires at bedside Call Time: [place] Time: 1700 End Time: 4259  Raliegh Ip, RN

## 2022-02-25 NOTE — Progress Notes (Signed)
Called by RN to evaluate patient for decreased mental status and increased need for O2.  Spoke with wife-- never took the 30 mg of long acting morphine at home.  D/cd medication and ordered narcan x 1 with improvement in mental status but not oxygenation.  ABG and x ray ordered.  Will try bipap as well but concern that patient may not be able to remove and wife reports he did not tolerate well with EMS.  Will do trial dose of IV lasix (grade 1 diastolic CHF)-- did get IVF in ER as well as blood so is 1.3L +.   BMP to recheck K (replaced earlier today). Wife reports some swallowing issues from radiation at baseline- was coughing with sips of liquid. Will ask PCCM to evaluate as a full code pending ABG results.  Eulogio Bear DO

## 2022-02-26 ENCOUNTER — Inpatient Hospital Stay (HOSPITAL_COMMUNITY): Payer: Medicare Other

## 2022-02-26 DIAGNOSIS — C383 Malignant neoplasm of mediastinum, part unspecified: Secondary | ICD-10-CM | POA: Diagnosis not present

## 2022-02-26 DIAGNOSIS — E876 Hypokalemia: Secondary | ICD-10-CM | POA: Diagnosis not present

## 2022-02-26 DIAGNOSIS — D61818 Other pancytopenia: Secondary | ICD-10-CM | POA: Diagnosis not present

## 2022-02-26 DIAGNOSIS — A419 Sepsis, unspecified organism: Secondary | ICD-10-CM | POA: Diagnosis not present

## 2022-02-26 DIAGNOSIS — J9601 Acute respiratory failure with hypoxia: Secondary | ICD-10-CM | POA: Diagnosis not present

## 2022-02-26 LAB — BLOOD GAS, ARTERIAL
Acid-Base Excess: 16.2 mmol/L — ABNORMAL HIGH (ref 0.0–2.0)
Bicarbonate: 42.9 mmol/L — ABNORMAL HIGH (ref 20.0–28.0)
Drawn by: 418751
O2 Saturation: 99.6 %
Patient temperature: 37
pCO2 arterial: 59 mmHg — ABNORMAL HIGH (ref 32–48)
pH, Arterial: 7.47 — ABNORMAL HIGH (ref 7.35–7.45)
pO2, Arterial: 122 mmHg — ABNORMAL HIGH (ref 83–108)

## 2022-02-26 LAB — BPAM RBC
Blood Product Expiration Date: 202402142359
Blood Product Expiration Date: 202402142359
ISSUE DATE / TIME: 202401262151
ISSUE DATE / TIME: 202401270108
Unit Type and Rh: 7300
Unit Type and Rh: 7300

## 2022-02-26 LAB — COMPREHENSIVE METABOLIC PANEL
ALT: 87 U/L — ABNORMAL HIGH (ref 0–44)
AST: 50 U/L — ABNORMAL HIGH (ref 15–41)
Albumin: <1.5 g/dL — ABNORMAL LOW (ref 3.5–5.0)
Alkaline Phosphatase: 173 U/L — ABNORMAL HIGH (ref 38–126)
Anion gap: 11 (ref 5–15)
BUN: 21 mg/dL (ref 8–23)
CO2: 33 mmol/L — ABNORMAL HIGH (ref 22–32)
Calcium: 7.8 mg/dL — ABNORMAL LOW (ref 8.9–10.3)
Chloride: 102 mmol/L (ref 98–111)
Creatinine, Ser: 0.64 mg/dL (ref 0.61–1.24)
GFR, Estimated: >60 mL/min (ref >60)
Glucose, Bld: 92 mg/dL (ref 70–99)
Potassium: 3 mmol/L — ABNORMAL LOW (ref 3.5–5.1)
Sodium: 146 mmol/L — ABNORMAL HIGH (ref 135–145)
Total Bilirubin: 1.1 mg/dL (ref 0.3–1.2)
Total Protein: 4.6 g/dL — ABNORMAL LOW (ref 6.5–8.1)

## 2022-02-26 LAB — TYPE AND SCREEN
ABO/RH(D): B POS
Antibody Screen: NEGATIVE
Unit division: 0
Unit division: 0

## 2022-02-26 LAB — GLUCOSE, CAPILLARY
Glucose-Capillary: 80 mg/dL (ref 70–99)
Glucose-Capillary: 191 mg/dL — ABNORMAL HIGH (ref 70–99)
Glucose-Capillary: 209 mg/dL — ABNORMAL HIGH (ref 70–99)

## 2022-02-26 LAB — BPAM PLATELET PHERESIS
Blood Product Expiration Date: 202401282359
ISSUE DATE / TIME: 202401270356
Unit Type and Rh: 5100

## 2022-02-26 LAB — PREPARE PLATELET PHERESIS: Unit division: 0

## 2022-02-26 MED ORDER — CHLORHEXIDINE GLUCONATE CLOTH 2 % EX PADS
6.0000 | MEDICATED_PAD | Freq: Every day | CUTANEOUS | Status: DC
  Administered 2022-02-26 – 2022-03-01 (×6): 6 via TOPICAL

## 2022-02-26 MED ORDER — POTASSIUM CHLORIDE CRYS ER 20 MEQ PO TBCR
40.0000 meq | EXTENDED_RELEASE_TABLET | Freq: Once | ORAL | Status: AC
  Administered 2022-02-26: 40 meq via ORAL
  Filled 2022-02-26: qty 2

## 2022-02-26 MED ORDER — SODIUM CHLORIDE 0.9 % IV SOLN
1.0000 g | Freq: Three times a day (TID) | INTRAVENOUS | Status: DC
  Administered 2022-02-26 – 2022-02-27 (×2): 1 g via INTRAVENOUS
  Filled 2022-02-26 (×4): qty 10

## 2022-02-26 MED ORDER — FUROSEMIDE 10 MG/ML IJ SOLN
20.0000 mg | Freq: Once | INTRAMUSCULAR | Status: AC
  Administered 2022-02-26: 20 mg via INTRAVENOUS
  Filled 2022-02-26: qty 2

## 2022-02-26 MED ORDER — SODIUM CHLORIDE 0.9% IV SOLUTION: Freq: Once | INTRAVENOUS | Status: AC

## 2022-02-26 MED ORDER — SODIUM CHLORIDE 0.9% FLUSH
10.0000 mL | Freq: Two times a day (BID) | INTRAVENOUS | Status: DC
  Administered 2022-02-26 – 2022-03-01 (×6): 10 mL

## 2022-02-26 MED ORDER — SODIUM CHLORIDE 0.9% FLUSH: 10.0000 mL | INTRAVENOUS | Status: DC | PRN

## 2022-02-26 MED ORDER — POTASSIUM CHLORIDE 20 MEQ PO PACK
20.0000 meq | PACK | Freq: Two times a day (BID) | ORAL | Status: DC
  Administered 2022-02-26 – 2022-02-28 (×4): 20 meq via ORAL
  Filled 2022-02-26 (×4): qty 1

## 2022-02-26 NOTE — Evaluation (Signed)
Clinical/Bedside Swallow Evaluation Patient Details  Name: Kelly Olson. MRN: [id number] Date of Birth: 1950/12/19  Today's Date: 02/26/2022 Time: SLP Start Time (ACUTE ONLY): 64 SLP Stop Time (ACUTE ONLY): 0950 SLP Time Calculation (min) (ACUTE ONLY): 15 min  Past Medical History:  Past Medical History:  Diagnosis Date   Coronary artery disease, occlusive 06/02/2014   Multivessel CAD.mLAD-100%, mRCA 99%, dRCA 100%, mLCX 90%  And EF 35-45%. -->  Referred for CABG; nonischemic Myoview March 2020   Former heavy tobacco smoker    Quit in April 2016    GERD (gastroesophageal reflux disease)    Hyperlipidemia with target LDL less than 70    Hypertension    Ischemic cardiomyopathy - resolved 05/2014   Myoview: EF ~33% with "infarction vs. severe resting ischemia in LAD & RCA territory; b) EF by Cath: 35-45%. 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   Stage T2 a, N0   Past Surgical History:  Past Surgical History:  Procedure Laterality Date   CARDIAC CATHETERIZATION N/A 06/02/2014   Procedure: Left Heart Cath and Coronary Angiography;  Surgeon: Leonie Man, MD;  Location: [facility] INVASIVE CV LAB CUPID;  Service: Cardiovascular;  mLAD-100%, mRCA 99%, dRCA 100%, mLCX 90%  And EF 35-45%.   CORONARY ARTERY BYPASS GRAFT N/A 06/04/2014   Procedure: CORONARY ARTERY BYPASS GRAFTING (CABG) times four using bilateral internal mammary arteries and EVH for left  leg saphenous vein;  Surgeon: Gaye Pollack, MD;  Location: [facility];  Service: Open Heart Surgery;  LIMA-OM, RIMA-LAD, SVG-Diag, SVG-rPDA   IR IMAGING GUIDED PORT INSERTION  05/16/2021   NM MYOVIEW LTD  05/28/2014   Pre-CABG:  High Risk Nuclear Stress Test with  multivessel distribution ischemia.  -- Severely ischemic Cardiomyoapthy with evidence of at least 2 vessel disease & EF of ~33%.  Images are consistent with either infarction or severe resting ischemia in the LAD & RCA territory.   NM MYOVIEW LTD  03/2018   EF 50-55%.  Small basal inferior fixed defect consistent with diaphragmatic attenuation.  No ischemia or infarction.  Septal dyskinesis due to post CABG state   TEE WITHOUT CARDIOVERSION N/A 06/04/2014   Procedure: TRANSESOPHAGEAL ECHOCARDIOGRAM (TEE);  Surgeon: Gaye Pollack, MD;  Location: [place];  Service: Open Heart Surgery;  Laterality: N/A;   THORACOTOMY Right 07/10/2016   Procedure: THORACOTOMY;  Surgeon: Gaye Pollack, MD;  Location: [facility] OR;  Service: Thoracic;  Laterality: Right;   THORACOTOMY/LOBECTOMY Right 07/29/2018   Procedure: THORACOTOMY/RIGHT LOWER LOBECTOMY;  Surgeon: Gaye Pollack, MD;  Location: [place];  Service: Thoracic;  Laterality: Right;   [place] ECHOCARDIOGRAM  08/10/2021   EF 60 to 65%.  GR 1 DD.  No RWMA.  Normal RV size and function.  Normal PAP.  Normal aortic and mitral valves.  Normal RAP.  Conclusion(s)/Recommendation(s): Normal biventricular function withoutevidence of hemodynamically significant valvular heart disease. . EF 50-55% with mild HK of basal anteroseptal myocardium. GR 1 DD.   VIDEO ASSISTED THORACOSCOPY (VATS)/WEDGE RESECTION Right 07/10/2016   Procedure: VIDEO ASSISTED THORACOSCOPY (VATS)/WEDGE RESECTION;  Surgeon: Gaye Pollack, MD;  Location: [facility] OR;  Service: Thoracic;  Laterality: Right;  VIDEO BRONCHOSCOPY WITH ENDOBRONCHIAL NAVIGATION N/A 04/13/2021   Procedure: VIDEO BRONCHOSCOPY WITH ENDOBRONCHIAL NAVIGATION;  Surgeon: Melrose Nakayama, MD;  Location: [place];  Service: Thoracic;  Laterality: N/A;   HPI:  Patient is a 72 y.o. male with PMH: GERD, CAD, ischemic cardiomyopathy, recurrent/metastatic small cell lung cancer (started chemoradiation treatments  in April of 2023, chronic pain, chronic hypoxic respiratory failure. He presented to the ED on 02/14/2022 from home after home health found him with oxygen saturation in the 60's.  He was admitted to the hospital one month ago with acute hypoxic respiratory failure due to pneumonia and COPD exacerbation and was discharged on 2L supplemental oxygen. In ED, he was febrile at 100.4 degrees F, mild elevation of HR and RR, stable BP, oxygen saturation 100% on NRB. CXR showed emphysema and interval worsening of left greater than right airspace disease.  On 1/27, rapid response was called due to patient being somnolent and with periods of apnea; 2 doses Narcan given via IV. He was placed on HHFNC at 10L but this was increased overnight to 55 and decreased to 45 in AM on 1/28. Patient's wife reported swallowing issues from radiation at baseline and he was observed to be coughing with sips of liquids.    Assessment / Plan / Recommendation  Clinical Impression  Patient presents with clinical s/s of dysphagia as per this bedside swallow evaluation. He is currently on 45L supplemental oxygen via HFNC. SpO2 and RR are WFL and patient's voice is clear and strong. His wife was present and they both provided history related to his swallow function. They report that patient has had difficulty swallowing since starting chemoradiation for lung cancer April of 2023. Patient describes difficulty with solids feeling as though they are stuck in throat and his wife reported that MD had given them prescription for something to help coat his throat to make it easier to swallow and this does help somewhat. Patient has history of GERD which could also be contributing to his globus sensation. He has not been seen by Speech therapy  for dysphagia as per review in Epic as well as patient and spouse both denying and SLP interventions. SLP observed patient with sips of thin liquids (water) and he did exhibit multiple (two) swallows for a sip and  some mild, brief congested coughing observed but no change in voice or vitals. As patient has not yet had an objective swallow evaluation and he has c/o of dysphagia s/p chemoradiation treatments, SLP recommending MBS. In the meantime, SLP recommends continuing on Dys 3 (mechanical soft) solids, thin liquids and patient advised to stop eating/drinking if getting SOB. SLP Visit Diagnosis: Dysphagia, unspecified (R13.10)    Aspiration Risk  Mild aspiration risk    Diet Recommendation Dysphagia 3 (Mech soft);Thin liquid   Medication Administration: Other (Comment) (as tolerated) Supervision: Patient able to self feed Compensations: Slow rate;Small sips/bites Postural Changes: Seated upright at 90 degrees;Remain upright for at least 30 minutes after po intake    Other  Recommendations Oral Care Recommendations: Oral care BID;Patient independent with oral care    Recommendations for follow up therapy are one component of a multi-disciplinary discharge planning process, led by the attending physician.  Recommendations may be updated based on patient status, additional functional criteria and insurance authorization.  Follow up Recommendations Other (comment) (TBD following MBS results)      Assistance Recommended at Discharge    Functional Status Assessment Patient has had a recent decline in their functional status and  demonstrates the ability to make significant improvements in function in a reasonable and predictable amount of time.  Frequency and Duration min 1 x/week  1 week       Prognosis Prognosis for Safe Diet Advancement: Good Barriers to Reach Goals: Time post onset      Swallow Study   General Date of Onset: 01/31/2022 HPI: Patient is a 72 y.o. male with PMH: GERD, CAD, ischemic cardiomyopathy, recurrent/metastatic small cell lung cancer (started chemoradiation treatments in April of 2023, chronic pain, chronic hypoxic respiratory failure. He presented to the ED on 02/02/2022 from  home after home health found him with oxygen saturation in the 60's.  He was admitted to the hospital one month ago with acute hypoxic respiratory failure due to pneumonia and COPD exacerbation and was discharged on 2L supplemental oxygen. In ED, he was febrile at 100.4 degrees F, mild elevation of HR and RR, stable BP, oxygen saturation 100% on NRB. CXR showed emphysema and interval worsening of left greater than right airspace disease.  On 1/27, rapid response was called due to patient being somnolent and with periods of apnea; 2 doses Narcan given via IV. He was placed on HHFNC at 10L but this was increased overnight to 55 and decreased to 45 in AM on 1/28. Patient's wife reported swallowing issues from radiation at baseline and he was observed to be coughing with sips of liquids. Type of Study: Bedside Swallow Evaluation Previous Swallow Assessment: none found Diet Prior to this Study: Dysphagia 3 (soft);Thin liquids Temperature Spikes Noted: No Respiratory Status: Nasal cannula History of Recent Intubation: No Behavior/Cognition: Alert;Cooperative;Pleasant mood Oral Cavity Assessment: Within Functional Limits Oral Care Completed by SLP: No Oral Cavity - Dentition: Adequate natural dentition Vision: Functional for self-feeding Self-Feeding Abilities: Able to feed self Patient Positioning: Upright in bed Baseline Vocal Quality: Normal Volitional Cough: Strong Volitional Swallow: Able to elicit    Oral/Motor/Sensory Function Overall Oral Motor/Sensory Function: Within functional limits   Ice Chips     Thin Liquid Thin Liquid: Impaired Pharyngeal  Phase Impairments: Multiple swallows;Other (comments) Other Comments: brief, congested sounding cough, no change in voice or vitals. Patient c/o globus sensation with solids    Nectar Thick     Honey Thick     Puree Puree: Not tested   Solid     Solid: Not tested      Sonia Baller, MA, CCC-SLP Speech Therapy

## 2022-02-26 NOTE — Significant Event (Addendum)
Rapid Response Event Note   Reason for Call :  Desaturation to mid 80%s  Initial Focused Assessment:  Patient in bed with eyes closed, opens eyes to voice, A&O x4. Conversing in short phrases vs full sentences. Skin cool and dry, pitting 3+. Heart tones regular and tachy. Non-productive cough. Lungs with some crackles noted, mild labored breathing with use of some abdominal muscles.   VS 137/80 (98) HR 110 RR 34 O2 100% on 60L 100% FiO2 HHFNC Temp 98.8  Interventions:  lasix Increase HHFNC ABG CXR  Plan of Care:  Call back for further needs  625ml UOP 121/73 (87) HR 104 RR 28 Voice noted to be stronger Waiting for ABG results  Event Summary:  MD Notified: Princella Ion MD Call Time: [place] Time: 9758 End Time: 1640  Newman Nickels, RN

## 2022-02-26 NOTE — Progress Notes (Signed)
[place].   DOB:[birth year]   GD#:[id number]    ASSESSMENT & PLAN:  Metastatic small cell lung ca He has received chemotherapy recently, complicated by neutropenic fever Continue supportive care   Neutropenic fever Blood cultures were positive. CXR showed findings worrisome for pneumonia Continue antibiotics Adding GCSF daily until [place] > 1.5   Acquired pancytopenia Due to chemo Agree with blood and platelet transfusion No need blood today Keep platelets >10,000, primary service has arranged for platelet transfusion support.   Poor venous access Recommend port access, this is discussed with bedside nursing staff to consult IV team to access his port today   Elevated LFT Due to recent chemo, stable Monitor closely   Electrolyte imbalance, severe protein calorie malnutrition Replace prn, dietitian review   Goals of care Resolution of neutropenic fever    Discharge planning Likely be here 3-4 days His primary oncologist will resume care tomorrow  All questions were answered. The patient knows to call the clinic with any problems, questions or concerns.   The total time spent in the appointment was 30 minutes encounter with patients including review of chart and various tests results, discussions about plan of care and coordination of care plan  Heath Lark, MD 02/26/2022 12:07 PM  Subjective:  He is feeling better today, more alert.  Noted that he has been afebrile overnight.  He still bruises extensively.  No active signs of bleeding.  Culture results and blood work were reviewed.  Platelet transfusion in progress  Objective:  Vitals:   02/26/22 1128 02/26/22 1144  BP: 125/67 115/65  Pulse: (!) 102 98  Resp: (!) 24 16  Temp: 98.5 F (36.9 C) 98.8 F (37.1 C)  SpO2: 100% 100%     Intake/Output Summary (Last 24 hours) at 02/26/2022 1207 Last data filed at 02/26/2022 1100 Gross per 24 hour  Intake 743.3 ml  Output 500 ml  Net 243.3 ml     GENERAL:alert, no distress and comfortable SKIN: Noted significant ecchymosis NEURO: alert & oriented x 3 with fluent speech, no focal motor/sensory deficits   Labs:  Recent Labs    02/01/2022 1936 02/25/22 0452 02/25/22 1744 02/26/22 0622  NA 142 145 146* 146*  K 2.3* 2.9* 3.4* 3.0*  CL 93* 98 100 102  CO2 38* 36* 37* 33*  GLUCOSE 265* 197* 119* 92  BUN 24* 19 21 21   CREATININE 0.81 0.66 0.72 0.64  CALCIUM 7.5* 7.3* 7.7* 7.8*  GFRNONAA >60 >60 >60 >60  PROT 4.4* 4.0*  --  4.6*  ALBUMIN 1.6* <1.5*  --  <1.5*  AST 90* 60*  --  50*  ALT 119* 100*  --  87*  ALKPHOS 229* 194*  --  173*  BILITOT 1.0 1.5*  --  1.1    Studies:  DG CHEST PORT 1 VIEW  Result Date: 02/25/2022 CLINICAL DATA:  Dyspnea. EXAM: PORTABLE CHEST 1 VIEW COMPARISON:  February 24, 2022. FINDINGS: Stable cardiomediastinal silhouette. Status post coronary bypass graft. Right internal jugular Port-A-Cath is unchanged. Stable bilateral lung opacities are noted concerning for pneumonia or edema. Bony thorax is unremarkable. Small bilateral pleural effusions may be present. IMPRESSION: Stable bilateral lung opacities and probable associated pleural effusions. Electronically Signed   By: Marijo Conception M.D.   On: 02/25/2022 18:41   DG Chest Port 1 View  Result Date: 02/04/2022 CLINICAL DATA:  Possible sepsis EXAM: PORTABLE CHEST 1 VIEW COMPARISON:  01/26/2022, CT 02/07/2022, PET CT 01/12/2022 FINDINGS: Right-sided central venous port tip  at the cavoatrial region. Post sternotomy changes. Worsened bilateral left greater than right heterogeneous airspace disease. Underlying emphysema and chronic interstitial opacity. Normal cardiac size. Aortic atherosclerosis. No pneumothorax. IMPRESSION: Emphysema. Interval worsening of left greater than right heterogeneous airspace disease which may be due to edema, pneumonia, or combination of the 2, on the left this superimposes CT demonstrated nodularity at the left base, see chest CT  report 02/07/2022. Electronically Signed   By: Donavan Foil M.D.   On: 02/03/2022 19:19   CT Chest W Contrast  Result Date: 02/07/2022 CLINICAL DATA:  Small-cell lung cancer staging. * Tracking Code: BO * EXAM: CT CHEST WITH CONTRAST TECHNIQUE: Multidetector CT imaging of the chest was performed during intravenous contrast administration. RADIATION DOSE REDUCTION: This exam was performed according to the departmental dose-optimization program which includes automated exposure control, adjustment of the mA and/or kV according to patient size and/or use of iterative reconstruction technique. CONTRAST:  68mL OMNIPAQUE IOHEXOL 300 MG/ML  SOLN COMPARISON:  X-ray 01/18/2022. PET-CT 01/12/2022. CT 11/04/2021. Older exams as well. FINDINGS: Cardiovascular: Right upper chest port identified. Slightly atrophic appearing internal jugular vein. Status post median sternotomy. The heart is nonenlarged. No significant pericardial effusion. The thoracic aorta has a normal course and caliber with scattered atherosclerotic plaque. Minimal focal ectasia along the aortic arch. Bovine type aortic arch identified. Coronary artery calcifications are noted. Mediastinum/Nodes: There is no specific abnormal lymph node enlargement seen in the axillary region, hilum or mediastinum. Normal caliber thoracic esophagus. Slightly heterogeneous thyroid gland. Lungs/Pleura: Advanced emphysematous lung changes identified, centrilobular diffusely seen. There is a tiny left pleural effusion greater than right. Areas of interstitial septal thickening identified and scarring/fibrosis. Patient is status post right-sided lobectomy lower lobe. There appears to be some asymmetric parenchymal opacity along the left lower lobe and dependent lingula. The lingular areas are increased from previous. Lower lobe is similar. Previously there were some small lung nodules. This includes a left lower lobe nodule measuring 8 x 9 mm on the prior and today this same  nodule on image 108 of series 7 measures 10 x 12 mm. The more dependent areas in the left lower lobe towards the diaphragm are poorly seen with the increasing opacity which has been developing since the prior standard CT scan of October 2023. One areas somewhat nodular in this location as measured on the prior PET-CT scan. On the PET-CT this was measured at 15 mm and today measured in similar fashion on series 7, image 114 measures 17 mm. Upper Abdomen: Along the upper abdomen the adrenal glands are diffusely thickened bilaterally. Please correlate for history. Hypertrophy is possible versus a lesion. This is significantly increased from previous. Fatty liver infiltration identified. Musculoskeletal: Mild degenerative changes seen along the spine. If there is concern of osseous metastatic disease, bone scan can be performed as clinically directed. Anasarca. IMPRESSION: Left lower lobe areas of nodularity appear to be slightly increasing from previous examinations. Some of these areas are partially obscured by the increasing parenchymal opacities in the lingula and left lower lobe. Extensive emphysematous lung changes as well as areas of scarring and fibrotic change. Tiny bilateral pleural effusions are identified. Left is similar to previous. The right is increasing. Chest port. No developing new lymph node enlargement. In the upper abdomen the adrenal glands today are diffusely thickened compared to the previous examinations and this is a new finding from October 2023 and December 2023. Please correlate with any known history or clinical evidence of adrenal abnormality. Otherwise additional  workup with MRI with and without contrast may be of benefit as the next step in the workup. Aortic Atherosclerosis (ICD10-I70.0) and Emphysema (ICD10-J43.9). Electronically Signed   By: Jill Side M.D.   On: 02/07/2022 18:50

## 2022-02-26 NOTE — Progress Notes (Addendum)
Came back to bedside as nursing reports an increased work of breathing after platelets.  Has LE edema.  SBP in 140s.  This AM was able to carry on conversation and currently winded while speaking few words.  Given IV lasix x 1 with UOP and no drop in BP.  Will expand abx coverage.  Check x ray.  Increased HFNC. Not sure would tolerate Bipap.   Eulogio Bear DO    Discussed with wife and wife's sister, code status and poor overall prognosis.  She will consider DNR status but will have to think about it further before making decision.  Patient's breathing seems improved s/p lasix.  Asking for ginger-ale for dry mouth.  Will hold on oral intake until after MBS and increased work of breathing improved.

## 2022-02-26 NOTE — Consult Note (Signed)
NAME:  Kelly Philbin., MRN:  [id number], DOB:  06-17-1950, LOS: 2 ADMISSION DATE:  02/02/2022, CONSULTATION DATE:  02/25/22 REFERRING MD:  Dr. 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  Denied recent fever, chills, N/V.  No change in his chronic bilateral LE edema, bruising more but no bleeding.  Admitted one month ago with PNA and AECOPD discharged on new home O2 2L.    On evaluation, found with SIRS, CXR c/w emphysema and L> R airspace disease with K 2.3, bicarb 38, elevated LFTs, BNP 689, Hgb 5.5, plts 6, WBC 0.3.  Oncology following.  1/27 with worsening respiratory distress, increasing O2 needs, and decreasing mental status, PCCM consulted further recs.  Patient complains of mild SOB, denies N/V or abd pain, mild neck discomfort.   Pertinent  Medical History   Past Medical History:  Diagnosis Date   Coronary artery disease, occlusive 06/02/2014   Multivessel CAD.mLAD-100%, mRCA 99%, dRCA 100%, mLCX 90%  And EF 35-45%. -->  Referred for CABG; nonischemic Myoview March 2020   Former heavy tobacco smoker    Quit in April 2016    GERD (gastroesophageal reflux disease)    Hyperlipidemia with target LDL less than 70    Hypertension    Ischemic cardiomyopathy - resolved 05/2014   Myoview: EF ~33% with "infarction vs. severe resting ischemia in LAD & RCA territory; b) EF by Cath: 35-45%. 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   Stage T2 a, N0   Significant Hospital Events: Including procedures, antibiotic start and stop dates in addition to other pertinent events   1/26 admitted TRH sepsis, PNA, pancytopenia, neutropenic fever 1/27 started on heated high flow for worsening shortness of breath.  Blood cultures positive for E. coli.  Interim History / Subjective:  Started desaturation this afternoon with increased O2 requirements.  By the time I reassessed him his breathing was back to baseline.  Objective   Blood pressure 121/73, pulse (!) 112, temperature 98.8 F (37.1 C), resp. rate (!) 23, height 6' (1.829 m), weight 65.8 kg, SpO2 100 %.    FiO2 (%):  [70 %-100 %] 100 %   Intake/Output Summary (Last 24 hours) at 02/26/2022 1834 Last data filed at 02/26/2022 1630 Gross per 24 hour  Intake 1114.3 ml  Output 1100 ml  Net 14.3 ml    Filed Weights   02/27/2022 1829  Weight: 65.8 kg   Examination: General:  AoC ill appearing male sitting upright in bed in mild distress HEENT: MM pink/dry, pupils 3/reactive Neuro: appears tired but able to sustain conversation, oriented x3, MAE-generalized weakness CV: rr, borderline NSR/ ST, +dp pulses, port right chest> not accessed PULM:  some abd WOB/ tachypnea ~30s, diffuse rales/ rhonchi, congested cough able to speak in full sentences with minimal accessory muscle use.  Less respiratory distress  than yesterday. GI: soft, bs+, NT, male purwick Extremities: warm/dry, +2-3 pitting LE edema  Skin: petechial rash to abd/ chest, scattered ecchymosis to BUE, healing abrasions/ ?excoriations to R ankle  Ancillary tests personally reviewed:  ABG essentially unchanged.  Oxygenation improving. Improving neutropenia.  But profoundly thrombocytopenic. Assessment & Plan:   Acute on chronic hypoxic respiratory failure  PNA,  CAP +/- aspiration Emphysema  - ok to remain in PCU for now -Continue heated high flow.  Unlikely to do well if needs  intubation. - abx as below - some concern for dysphagia s/p XRT.  SLP consult - NPO for now except meds  - aspiration precautions - hold on lasix for now - caution w/ sedating meds/ morphine (narcan prn) - send urine for strep and legionella  - send sputum cx if able to produce  - prn BD (not on home Bds) - pulm hygiene> IS/ PT as able   Sepsis secondary to PNA and E. 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 Julien Nordmann, on carboplatin, etoposide, cosela, imfinzi s/p XRT brain - oncology following - morphine prn w/ prn narcan - primary team re-consulting PMT for ongoing [place]  Acquired pancytopenia 2/2 chemo - trend CBC - goal plts >10k - transfuse prn for Hgb < 7 - adding GCSF till [place] > 1.5   Hypokalemia  - s/p replete, recheck BMET pending  - check Mag  - replete electrolytes prn   Elevated LFTs - suspected from chemo - trend   Prolonged Qtc - tele monitoring - avoid Qtc prolonging meds - maximize K/ Mag  HTN HFpEF - continue coreg and losartan for now> monitor closely for worsening hypotension with GNR bacteremia  DMT - SSI  Protein calorie malnutrition - recs for RD consult  Best Practice (right click and "Reselect all SmartList Selections" daily)   Diet/type: NPO w/ oral meds DVT prophylaxis: SCD GI prophylaxis: PPI Lines: N/A Foley:  N/A Code Status:  full code Last date of multidisciplinary goals of care discussion [1/26]  Kipp Brood, MD [facility] ICU Physician [place]  Pager: [phone] Or Epic Secure Chat After hours: [phone].  02/26/2022, 6:42 PM

## 2022-02-26 NOTE — Progress Notes (Signed)
PROGRESS NOTE    Kelly Olson.  OXB:[id number] DOB: 1950/09/08 DOA: 02/14/2022 PCP: Wenda Low, MD    Brief Narrative:  Kelly Olson. is a pleasant 72 y.o. male with medical history significant for CAD, ischemic cardiomyopathy with recovered EF, recurrent/metastatic small cell lung cancer, chronic pain, and chronic hypoxic respiratory failure who presents emergency department after he was found by home health to have oxygen saturation in the 72s.   Patient was admitted to the hospital 1 month ago with acute hypoxic respiratory failure due to pneumonia and COPD exacerbation.  He was discharged with 2 L/min of supplemental oxygen which he had been using ever since.  He had been feeling more short of breath the last couple days and turned his oxygen up to 3 L/min.  He was seen by home health today who noted his oxygen saturation to be in the 60s despite supplemental oxygen.   Now on HFNC  Assessment and Plan: Pneumonia; acute hypoxic respiratory failure  - Blood cultures collected in ED and empiric antibiotics started  -  strep pneumo negative and legionella antigens negative, continue antibiotics, continue supplemental O2, follow cultures and clinical course   -on HFNC-- wean as able   Ecoli bacteremia -on IV abx -await final culture  Pancytopenia - On admission, WBC 200 with ANC 140 and ALC 10; Hgb 5.5; platelets 6000  - Likely from chemotherapy, no overt bleeding  - 2 units RBC and 1 unit platelets ordered from ED - another unit of plts 1/28 - oncology consulted- Adding GCSF daily until [place] > 1.5     Lung cancer  - Currently undergoing treatment with carboplatin and etoposide as well as Cosela and Imfinzi under the care of Dr. Julien Nordmann   -asked palliative care to follow while hospitalized    Hypokalemia  - Repelte    Elevated LFTs  - Appears fairly stable, likely related to liver metastatic disease      Chronic HFpEF  - Appears compensated   - Monitor  weights and I/Os   -may need PRN lasix (BNP slightly elevated)   HTN  - Continue Coreg and losartan as tolerated     Chronic pain  - Prescription database reviewed  - Continue long- and short-acting morphine- will lower dose   Prolonged QT interval  -  avoid QT-prolonging medications      DVT prophylaxis: SCDs Start: 02/22/2022 2202    Code Status: Full Code Family Communication: called wife  Disposition Plan:  Level of care: Progressive Status is: Inpatient Remains inpatient appropriate because: sick    Consultants:  Oncology PCCM   Subjective: Much more awake and interactive-- asking to eat  Objective: Vitals:   02/26/22 0259 02/26/22 0300 02/26/22 0752 02/26/22 0800  BP:  122/85 112/75 129/72  Pulse: 88 86 91 91  Resp: (!) 24 14 20 19   Temp:    98 F (36.7 C)  TempSrc:    Oral  SpO2: 100% 100% 100% 100%  Weight:      Height:        Intake/Output Summary (Last 24 hours) at 02/26/2022 1116 Last data filed at 02/26/2022 0647 Gross per 24 hour  Intake 743.3 ml  Output --  Net 743.3 ml   Filed Weights   02/26/2022 1829  Weight: 65.8 kg    Examination:  General: Appearance:    Thin male in no acute distress     Lungs:     Diminished, respirations unlabored  Heart:  Normal heart rate. Normal rhythm. No murmurs, rubs, or gallops.   MS:   All extremities are intact.   Neurologic:   Awake, alert, oriented x 3. No apparent focal neurological           defect.        Data Reviewed: I have personally reviewed following labs and imaging studies  CBC: Recent Labs  Lab 02/21/22 1214 02/16/2022 2007 02/25/22 0452 02/25/22 1744 02/26/22 0622  WBC 0.5* 0.2* 0.3* 0.6* 0.6*  NEUTROABS 0.4* 0.1* 0.2*  --  0.4*  HGB 8.9* 5.5* 8.4* 8.5* 8.2*  HCT 26.9* 16.2* 23.6* 25.0* 25.0*  MCV 101.1* 100.6* 91.1 93.6 95.1  PLT 34* 6* 11* 11* 6*   Basic Metabolic Panel: Recent Labs  Lab 02/21/22 1214 02/18/2022 1936 02/25/22 0452 02/25/22 1744 02/26/22 0622  NA  146* 142 145 146* 146*  K 3.0* 2.3* 2.9* 3.4* 3.0*  CL 97* 93* 98 100 102  CO2 43* 38* 36* 37* 33*  GLUCOSE 244* 265* 197* 119* 92  BUN 24* 24* 19 21 21   CREATININE 0.65 0.81 0.66 0.72 0.64  CALCIUM 7.9* 7.5* 7.3* 7.7* 7.8*  MG  --   --   --  1.6*  --   PHOS  --   --   --  1.8*  --    GFR: Estimated Creatinine Clearance: 78.8 mL/min (by C-G formula based on SCr of 0.64 mg/dL). Liver Function Tests: Recent Labs  Lab 02/21/22 1214 02/19/2022 1936 02/25/22 0452 02/26/22 0622  AST 68* 90* 60* 50*  ALT 98* 119* 100* 87*  ALKPHOS 400* 229* 194* 173*  BILITOT 1.1 1.0 1.5* 1.1  PROT 5.0* 4.4* 4.0* 4.6*  ALBUMIN 2.8* 1.6* <1.5* <1.5*   No results for input(s): "LIPASE", "AMYLASE" in the last 168 hours. No results for input(s): "AMMONIA" in the last 168 hours. Coagulation Profile: Recent Labs  Lab 02/05/2022 1936  INR 1.3*   Cardiac Enzymes: No results for input(s): "CKTOTAL", "CKMB", "CKMBINDEX", "TROPONINI" in the last 168 hours. BNP (last 3 results) No results for input(s): "PROBNP" in the last 8760 hours. HbA1C: No results for input(s): "HGBA1C" in the last 72 hours. CBG: Recent Labs  Lab 02/25/22 0921 02/25/22 1118 02/25/22 1755 02/25/22 2106 02/26/22 0628  GLUCAP 186* 181* 124* 78 80   Lipid Profile: No results for input(s): "CHOL", "HDL", "LDLCALC", "TRIG", "CHOLHDL", "LDLDIRECT" in the last 72 hours. Thyroid Function Tests: No results for input(s): "TSH", "T4TOTAL", "FREET4", "T3FREE", "THYROIDAB" in the last 72 hours. Anemia Panel: No results for input(s): "VITAMINB12", "FOLATE", "FERRITIN", "TIBC", "IRON", "RETICCTPCT" in the last 72 hours. Sepsis Labs: Recent Labs  Lab 02/09/2022 1936 02/23/2022 2029  LATICACIDVEN 1.6 1.3    Recent Results (from the past 240 hour(s))  Blood Culture (routine x 2)     Status: Abnormal (Preliminary result)   Collection Time: 02/26/2022  7:15 PM   Specimen: BLOOD  Result Value Ref Range Status   Specimen Description BLOOD SITE  NOT SPECIFIED  Final   Special Requests   Final    BOTTLES DRAWN AEROBIC AND ANAEROBIC Blood Culture results may not be optimal due to an excessive volume of blood received in culture bottles   Culture  Setup Time   Final    GRAM NEGATIVE RODS IN BOTH AEROBIC AND ANAEROBIC BOTTLES Organism ID to follow CRITICAL RESULT CALLED TO, READ BACK BY AND VERIFIED WITH:  C/ PHARMD J. MILLEN 02/25/22 1324 A. LAFRANCE Performed at [facility], [place]  [address] [place], [place] [address]    Culture ESCHERICHIA COLI (A)  Final   Report Status PENDING  Incomplete  Blood Culture ID Panel (Reflexed)     Status: Abnormal   Collection Time: 02/17/2022  7:15 PM  Result Value Ref Range Status   Enterococcus faecalis NOT DETECTED NOT DETECTED Final   Enterococcus Faecium NOT DETECTED NOT DETECTED Final   Listeria monocytogenes NOT DETECTED NOT DETECTED Final   Staphylococcus species NOT DETECTED NOT DETECTED Final   Staphylococcus aureus (BCID) NOT DETECTED NOT DETECTED Final   Staphylococcus epidermidis NOT DETECTED NOT DETECTED Final   Staphylococcus lugdunensis NOT DETECTED NOT DETECTED Final   Streptococcus species NOT DETECTED NOT DETECTED Final   Streptococcus agalactiae NOT DETECTED NOT DETECTED Final   Streptococcus pneumoniae NOT DETECTED NOT DETECTED Final   Streptococcus pyogenes NOT DETECTED NOT DETECTED Final   A.calcoaceticus-baumannii NOT DETECTED NOT DETECTED Final   Bacteroides fragilis NOT DETECTED NOT DETECTED Final   Enterobacterales DETECTED (A) NOT DETECTED Final    Comment: Enterobacterales represent a large order of gram negative bacteria, not a single organism. CRITICAL RESULT CALLED TO, READ BACK BY AND VERIFIED WITH:  C/ PHARMD J. MILLEN 02/25/22 1324 A. LAFRANCE    Enterobacter cloacae complex NOT DETECTED NOT DETECTED Final   Escherichia coli DETECTED (A) NOT DETECTED Final    Comment: CRITICAL RESULT CALLED TO, READ BACK BY AND VERIFIED WITH:  C/ PHARMD J. MILLEN  02/25/22 1324 A. LAFRANCE    Klebsiella aerogenes NOT DETECTED NOT DETECTED Final   Klebsiella oxytoca NOT DETECTED NOT DETECTED Final   Klebsiella pneumoniae NOT DETECTED NOT DETECTED Final   Proteus species NOT DETECTED NOT DETECTED Final   Salmonella species NOT DETECTED NOT DETECTED Final   Serratia marcescens NOT DETECTED NOT DETECTED Final   Haemophilus influenzae NOT DETECTED NOT DETECTED Final   Neisseria meningitidis NOT DETECTED NOT DETECTED Final   Pseudomonas aeruginosa NOT DETECTED NOT DETECTED Final   Stenotrophomonas maltophilia NOT DETECTED NOT DETECTED Final   Candida albicans NOT DETECTED NOT DETECTED Final   Candida auris NOT DETECTED NOT DETECTED Final   Candida glabrata NOT DETECTED NOT DETECTED Final   Candida krusei NOT DETECTED NOT DETECTED Final   Candida parapsilosis NOT DETECTED NOT DETECTED Final   Candida tropicalis NOT DETECTED NOT DETECTED Final   Cryptococcus neoformans/gattii NOT DETECTED NOT DETECTED Final   CTX-M ESBL NOT DETECTED NOT DETECTED Final   Carbapenem resistance IMP NOT DETECTED NOT DETECTED Final   Carbapenem resistance KPC NOT DETECTED NOT DETECTED Final   Carbapenem resistance NDM NOT DETECTED NOT DETECTED Final   Carbapenem resist OXA 48 LIKE NOT DETECTED NOT DETECTED Final   Carbapenem resistance VIM NOT DETECTED NOT DETECTED Final    Comment: Performed at [facility], [address], [place], [place] [address]  Blood Culture (routine x 2)     Status: None (Preliminary result)   Collection Time: 02/14/2022  7:30 PM   Specimen: BLOOD  Result Value Ref Range Status   Specimen Description BLOOD SITE NOT SPECIFIED  Final   Special Requests   Final    BOTTLES DRAWN AEROBIC AND ANAEROBIC Blood Culture results may not be optimal due to an excessive volume of blood received in culture bottles   Culture  Setup Time   Final    ANAEROBIC BOTTLE ONLY GRAM NEGATIVE RODS CRITICAL VALUE NOTED.  VALUE IS CONSISTENT WITH PREVIOUSLY REPORTED  AND CALLED VALUE. Performed at [facility], [place] [address],  [place], [place] [address]    Culture GRAM NEGATIVE RODS  Final   Report Status PENDING  Incomplete  Resp panel by RT-PCR (RSV, Flu A&B, Covid) Anterior Nasal Swab     Status: None   Collection Time: 02/25/22  8:59 AM   Specimen: Anterior Nasal Swab  Result Value Ref Range Status   SARS Coronavirus 2 by RT PCR NEGATIVE NEGATIVE Final    Comment: (NOTE) SARS-CoV-2 target nucleic acids are NOT DETECTED.  The SARS-CoV-2 RNA is generally detectable in upper respiratory specimens during the acute phase of infection. The lowest concentration of SARS-CoV-2 viral copies this assay can detect is 138 copies/mL. A negative result does not preclude SARS-Cov-2 infection and should not be used as the sole basis for treatment or other patient management decisions. A negative result may occur with  improper specimen collection/handling, submission of specimen other than nasopharyngeal swab, presence of viral mutation(s) within the areas targeted by this assay, and inadequate number of viral copies(<138 copies/mL). A negative result must be combined with clinical observations, patient history, and epidemiological information. The expected result is Negative.  Fact Sheet for Patients:  EntrepreneurPulse.com.au  Fact Sheet for Healthcare Providers:  IncredibleEmployment.be  This test is no t yet approved or cleared by the [place] FDA and  has been authorized for detection and/or diagnosis of SARS-CoV-2 by FDA under an Emergency Use Authorization (EUA). This EUA will remain  in effect (meaning this test can be used) for the duration of the COVID-19 declaration under Section 564(b)(1) of the Act, 21 U.S.C.section 360bbb-3(b)(1), unless the authorization is terminated  or revoked sooner.       Influenza A by PCR NEGATIVE NEGATIVE Final   Influenza B by PCR NEGATIVE NEGATIVE Final     Comment: (NOTE) The Xpert Xpress SARS-CoV-2/FLU/RSV plus assay is intended as an aid in the diagnosis of influenza from Nasopharyngeal swab specimens and should not be used as a sole basis for treatment. Nasal washings and aspirates are unacceptable for Xpert Xpress SARS-CoV-2/FLU/RSV testing.  Fact Sheet for Patients: EntrepreneurPulse.com.au  Fact Sheet for Healthcare Providers: IncredibleEmployment.be  This test is not yet approved or cleared by the [place] FDA and has been authorized for detection and/or diagnosis of SARS-CoV-2 by FDA under an Emergency Use Authorization (EUA). This EUA will remain in effect (meaning this test can be used) for the duration of the COVID-19 declaration under Section 564(b)(1) of the Act, 21 U.S.C. section 360bbb-3(b)(1), unless the authorization is terminated or revoked.     Resp Syncytial Virus by PCR NEGATIVE NEGATIVE Final    Comment: (NOTE) Fact Sheet for Patients: EntrepreneurPulse.com.au  Fact Sheet for Healthcare Providers: IncredibleEmployment.be  This test is not yet approved or cleared by the [place] FDA and has been authorized for detection and/or diagnosis of SARS-CoV-2 by FDA under an Emergency Use Authorization (EUA). This EUA will remain in effect (meaning this test can be used) for the duration of the COVID-19 declaration under Section 564(b)(1) of the Act, 21 U.S.C. section 360bbb-3(b)(1), unless the authorization is terminated or revoked.  Performed at [facility], [place] [address], [place], [place] [address]   MRSA Next Gen by PCR, Nasal     Status: None   Collection Time: 02/25/22  8:59 AM  Result Value Ref Range Status   MRSA by PCR Next Gen NOT DETECTED NOT DETECTED Final    Comment: (NOTE) The GeneXpert MRSA Assay (FDA approved for NASAL specimens only), is one component of a comprehensive MRSA  colonization  surveillance program. It is not intended to diagnose MRSA infection nor to guide or monitor treatment for MRSA infections. Test performance is not FDA approved in patients less than 46 years old. Performed at [facility], [place] [address], [place], [place] [address]          Radiology Studies: DG CHEST PORT 1 VIEW  Result Date: 02/25/2022 CLINICAL DATA:  Dyspnea. EXAM: PORTABLE CHEST 1 VIEW COMPARISON:  February 24, 2022. FINDINGS: Stable cardiomediastinal silhouette. Status post coronary bypass graft. Right internal jugular Port-A-Cath is unchanged. Stable bilateral lung opacities are noted concerning for pneumonia or edema. Bony thorax is unremarkable. Small bilateral pleural effusions may be present. IMPRESSION: Stable bilateral lung opacities and probable associated pleural effusions. Electronically Signed   By: Marijo Conception M.D.   On: 02/25/2022 18:41   DG Chest Port 1 View  Result Date: 01/30/2022 CLINICAL DATA:  Possible sepsis EXAM: PORTABLE CHEST 1 VIEW COMPARISON:  01/26/2022, CT 02/07/2022, PET CT 01/12/2022 FINDINGS: Right-sided central venous port tip at the cavoatrial region. Post sternotomy changes. Worsened bilateral left greater than right heterogeneous airspace disease. Underlying emphysema and chronic interstitial opacity. Normal cardiac size. Aortic atherosclerosis. No pneumothorax. IMPRESSION: Emphysema. Interval worsening of left greater than right heterogeneous airspace disease which may be due to edema, pneumonia, or combination of the 2, on the left this superimposes CT demonstrated nodularity at the left base, see chest CT report 02/07/2022. Electronically Signed   By: Donavan Foil M.D.   On: 02/07/2022 19:19        Scheduled Meds:  carvedilol  6.25 mg Oral BID WC   insulin aspart  0-15 Units Subcutaneous TID WC   insulin aspart  0-5 Units Subcutaneous QHS   losartan  25 mg Oral Daily   potassium chloride SA  20 mEq Oral BID   sodium chloride flush  3  mL Intravenous Q12H   sucralfate  1 g Oral QID   Tbo-filgastrim (GRANIX) SQ  480 mcg Subcutaneous q1800   Continuous Infusions:  cefTRIAXone (ROCEPHIN)  IV Stopped (02/26/22 0206)   doxycycline (VIBRAMYCIN) IV 100 mg (02/26/22 0831)   magnesium sulfate bolus IVPB       LOS: 2 days    Time spent: 45 minutes spent on chart review, discussion with nursing staff, consultants, updating family and interview/physical exam; more than 50% of that time was spent in counseling and/or coordination of care.    Geradine Girt, DO [facility] Available via Epic secure chat 7am-7pm After these hours, please refer to coverage provider listed on [email or web address] 02/26/2022, 11:16 AM

## 2022-02-26 NOTE — Progress Notes (Addendum)
O2 sats down to 83% on 30L HFNC.  Pt reports feeling "winded" and wife concerned that he seems to be having a lot more difficulty breathing than earlier today.  Coarse crackles noted.  Platelets 34ml recently completed.  O2 bumped up to 50L, SpO2 up to 93%.  Pt MD paged, received order for IV lasix.  Respiratory also paged and will be by for a breathing treatment.

## 2022-02-26 NOTE — Progress Notes (Signed)
Pharmacy Antibiotic Note  Kelly Olson. is a 72 y.o. male with worsening pneumonia and also with neutropenic fever  Pharmacy has been consulted for cefepime dosing. -WBC= 0.6, tmax= 100.3, CrCl ~ 80  Plan: -Cefepime 1gm IV q8h -Will follow renal function, cultures and clinical progress   Height: 6' (182.9 cm) Weight: 65.8 kg (145 lb) IBW/kg (Calculated) : 77.6  Temp (24hrs), Avg:99 F (37.2 C), Min:97.5 F (36.4 C), Max:100.3 F (37.9 C)  Recent Labs  Lab 02/21/22 1214 02/10/2022 1936 02/25/2022 2007 02/23/2022 2029 02/25/22 0452 02/25/22 1744 02/26/22 0622  WBC 0.5*  --  0.2*  --  0.3* 0.6* 0.6*  CREATININE 0.65 0.81  --   --  0.66 0.72 0.64  LATICACIDVEN  --  1.6  --  1.3  --   --   --     Estimated Creatinine Clearance: 78.8 mL/min (by C-G formula based on SCr of 0.64 mg/dL).    No Known Allergies  Antimicrobials this admission: 1/26 CTX >> 1/28 1/26 doxy >> 1/26 vanco >> 1/27  1/28 cefepime  Dose adjustments this admission:   Microbiology results: 1/26 Bcx: GNR; BCID: E coli    Thank you for allowing pharmacy to be a part of this patient's care.  Hildred Laser, PharmD Clinical Pharmacist **Pharmacist phone directory can now be found on [email or web address] ([email or web address]).  Listed under [place].

## 2022-02-26 NOTE — Progress Notes (Signed)
SpO2 100% on 50L, weaned down to 30L.

## 2022-02-27 ENCOUNTER — Inpatient Hospital Stay (HOSPITAL_COMMUNITY): Payer: Medicare Other

## 2022-02-27 DIAGNOSIS — C3490 Malignant neoplasm of unspecified part of unspecified bronchus or lung: Secondary | ICD-10-CM | POA: Diagnosis not present

## 2022-02-27 DIAGNOSIS — I2510 Atherosclerotic heart disease of native coronary artery without angina pectoris: Secondary | ICD-10-CM

## 2022-02-27 DIAGNOSIS — D61818 Other pancytopenia: Secondary | ICD-10-CM | POA: Diagnosis not present

## 2022-02-27 DIAGNOSIS — Z515 Encounter for palliative care: Secondary | ICD-10-CM

## 2022-02-27 DIAGNOSIS — J9621 Acute and chronic respiratory failure with hypoxia: Secondary | ICD-10-CM | POA: Diagnosis not present

## 2022-02-27 DIAGNOSIS — J9601 Acute respiratory failure with hypoxia: Secondary | ICD-10-CM | POA: Diagnosis not present

## 2022-02-27 DIAGNOSIS — R7881 Bacteremia: Secondary | ICD-10-CM | POA: Diagnosis present

## 2022-02-27 DIAGNOSIS — C383 Malignant neoplasm of mediastinum, part unspecified: Secondary | ICD-10-CM | POA: Diagnosis not present

## 2022-02-27 DIAGNOSIS — Z66 Do not resuscitate: Secondary | ICD-10-CM

## 2022-02-27 DIAGNOSIS — Z7189 Other specified counseling: Secondary | ICD-10-CM

## 2022-02-27 DIAGNOSIS — B9620 Unspecified Escherichia coli [E. coli] as the cause of diseases classified elsewhere: Secondary | ICD-10-CM | POA: Diagnosis not present

## 2022-02-27 DIAGNOSIS — E1165 Type 2 diabetes mellitus with hyperglycemia: Secondary | ICD-10-CM | POA: Diagnosis not present

## 2022-02-27 LAB — CULTURE, BLOOD (ROUTINE X 2)

## 2022-02-27 LAB — CBC WITH DIFFERENTIAL/PLATELET
Abs Immature Granulocytes: 0.08 10*3/uL — ABNORMAL HIGH (ref 0.00–0.07)
Abs Immature Granulocytes: 0 10*3/uL (ref 0.00–0.07)
Basophils Absolute: 0 10*3/uL (ref 0.0–0.1)
Basophils Absolute: 0 10*3/uL (ref 0.0–0.1)
Basophils Relative: 4 %
Basophils Relative: 0 %
Eosinophils Absolute: 0 10*3/uL (ref 0.0–0.5)
Eosinophils Absolute: 0 10*3/uL (ref 0.0–0.5)
Eosinophils Relative: 0 %
Eosinophils Relative: 1 %
HCT: 25 % — ABNORMAL LOW (ref 39.0–52.0)
HCT: 23.1 % — ABNORMAL LOW (ref 39.0–52.0)
Hemoglobin: 8.2 g/dL — ABNORMAL LOW (ref 13.0–17.0)
Hemoglobin: 7.8 g/dL — ABNORMAL LOW (ref 13.0–17.0)
Immature Granulocytes: 14 %
Lymphocytes Relative: 4 %
Lymphocytes Relative: 3 %
Lymphs Abs: 0 10*3/uL — ABNORMAL LOW (ref 0.7–4.0)
Lymphs Abs: 0 10*3/uL — ABNORMAL LOW (ref 0.7–4.0)
MCH: 31.2 pg (ref 26.0–34.0)
MCH: 32 pg (ref 26.0–34.0)
MCHC: 32.8 g/dL (ref 30.0–36.0)
MCHC: 33.8 g/dL (ref 30.0–36.0)
MCV: 95.1 fL (ref 80.0–100.0)
MCV: 94.7 fL (ref 80.0–100.0)
Monocytes Absolute: 0.1 10*3/uL (ref 0.1–1.0)
Monocytes Absolute: 0 10*3/uL — ABNORMAL LOW (ref 0.1–1.0)
Monocytes Relative: 12 %
Monocytes Relative: 1 %
Neutro Abs: 0.4 10*3/uL — CL (ref 1.7–7.7)
Neutro Abs: 1.1 10*3/uL — ABNORMAL LOW (ref 1.7–7.7)
Neutrophils Relative %: 66 %
Neutrophils Relative %: 95 %
Platelets: 6 10*3/uL — CL (ref 150–400)
Platelets: 9 10*3/uL — CL (ref 150–400)
RBC: 2.63 MIL/uL — ABNORMAL LOW (ref 4.22–5.81)
RBC: 2.44 MIL/uL — ABNORMAL LOW (ref 4.22–5.81)
RDW: 17.8 % — ABNORMAL HIGH (ref 11.5–15.5)
RDW: 17.1 % — ABNORMAL HIGH (ref 11.5–15.5)
WBC: 0.6 10*3/uL — CL (ref 4.0–10.5)
WBC: 1.2 10*3/uL — CL (ref 4.0–10.5)
nRBC: 19.3 % — ABNORMAL HIGH (ref 0.0–0.2)
nRBC: 11 /100 WBC — ABNORMAL HIGH
nRBC: 6.8 % — ABNORMAL HIGH (ref 0.0–0.2)

## 2022-02-27 LAB — PREPARE PLATELET PHERESIS: Unit division: 0

## 2022-02-27 LAB — GLUCOSE, CAPILLARY
Glucose-Capillary: 169 mg/dL — ABNORMAL HIGH (ref 70–99)
Glucose-Capillary: 188 mg/dL — ABNORMAL HIGH (ref 70–99)
Glucose-Capillary: 134 mg/dL — ABNORMAL HIGH (ref 70–99)
Glucose-Capillary: 144 mg/dL — ABNORMAL HIGH (ref 70–99)
Glucose-Capillary: 186 mg/dL — ABNORMAL HIGH (ref 70–99)

## 2022-02-27 LAB — COMPREHENSIVE METABOLIC PANEL
ALT: 83 U/L — ABNORMAL HIGH (ref 0–44)
AST: 47 U/L — ABNORMAL HIGH (ref 15–41)
Albumin: <1.5 g/dL — ABNORMAL LOW (ref 3.5–5.0)
Alkaline Phosphatase: 234 U/L — ABNORMAL HIGH (ref 38–126)
Anion gap: 10 (ref 5–15)
BUN: 20 mg/dL (ref 8–23)
CO2: 36 mmol/L — ABNORMAL HIGH (ref 22–32)
Calcium: 7.8 mg/dL — ABNORMAL LOW (ref 8.9–10.3)
Chloride: 98 mmol/L (ref 98–111)
Creatinine, Ser: 0.64 mg/dL (ref 0.61–1.24)
GFR, Estimated: >60 mL/min (ref >60)
Glucose, Bld: 166 mg/dL — ABNORMAL HIGH (ref 70–99)
Potassium: 2.8 mmol/L — ABNORMAL LOW (ref 3.5–5.1)
Sodium: 144 mmol/L (ref 135–145)
Total Bilirubin: 0.8 mg/dL (ref 0.3–1.2)
Total Protein: 4.6 g/dL — ABNORMAL LOW (ref 6.5–8.1)

## 2022-02-27 LAB — BPAM PLATELET PHERESIS
Blood Product Expiration Date: 202401282359
ISSUE DATE / TIME: 202401281114
Unit Type and Rh: 6200

## 2022-02-27 MED ORDER — MAGNESIUM SULFATE 2 GM/50ML IV SOLN
2.0000 g | Freq: Once | INTRAVENOUS | Status: AC
  Administered 2022-02-27: 2 g via INTRAVENOUS
  Filled 2022-02-27: qty 50

## 2022-02-27 MED ORDER — SODIUM CHLORIDE 0.9 % IV BOLUS: 500.0000 mL | Freq: Once | INTRAVENOUS | Status: DC

## 2022-02-27 MED ORDER — POTASSIUM CHLORIDE 10 MEQ/100ML IV SOLN
10.0000 meq | INTRAVENOUS | Status: AC
  Administered 2022-02-27 (×3): 10 meq via INTRAVENOUS
  Filled 2022-02-27 (×2): qty 100

## 2022-02-27 MED ORDER — SODIUM CHLORIDE 0.9 % IV SOLN
2.0000 g | Freq: Three times a day (TID) | INTRAVENOUS | Status: DC
  Administered 2022-02-27 – 2022-02-28 (×3): 2 g via INTRAVENOUS
  Filled 2022-02-27 (×3): qty 12.5

## 2022-02-27 MED ORDER — POTASSIUM CHLORIDE 20 MEQ PO PACK
40.0000 meq | PACK | Freq: Once | ORAL | Status: AC
  Administered 2022-02-27: 40 meq via ORAL
  Filled 2022-02-27: qty 2

## 2022-02-27 MED ORDER — METRONIDAZOLE 500 MG/100ML IV SOLN
500.0000 mg | Freq: Two times a day (BID) | INTRAVENOUS | Status: DC
  Administered 2022-02-27 – 2022-03-01 (×5): 500 mg via INTRAVENOUS
  Filled 2022-02-27 (×5): qty 100

## 2022-02-27 MED ORDER — POTASSIUM CHLORIDE 10 MEQ/100ML IV SOLN
INTRAVENOUS | Status: AC
  Filled 2022-02-27: qty 100

## 2022-02-27 MED ORDER — SODIUM CHLORIDE 0.9% IV SOLUTION: Freq: Once | INTRAVENOUS | Status: AC

## 2022-02-27 NOTE — Progress Notes (Signed)
PROGRESS NOTE    Kelly Olson.  YKD:[id number] DOB: [birth year] DOA: 02/23/2022 PCP: Wenda Low, MD    Brief Narrative:  Kelly Olson. is a pleasant 72 y.o. male with medical history significant for CAD, ischemic cardiomyopathy with recovered EF, recurrent/metastatic small cell lung cancer, chronic pain, and chronic hypoxic respiratory failure who presents emergency department after he was found by home health to have oxygen saturation in the 69s.   Patient was admitted to the hospital 1 month ago with acute hypoxic respiratory failure due to pneumonia and COPD exacerbation.  He was discharged with 2 L/min of supplemental oxygen which he had been using ever since.  He had been feeling more short of breath the last couple days and turned his oxygen up to 3 L/min.  He was seen by home health today who noted his oxygen saturation to be in the 60s despite supplemental oxygen.   Now on HFNC  Assessment and Plan: Pneumonia; acute hypoxic respiratory failure  - Blood cultures collected in ED and empiric antibiotics started  -  strep pneumo negative and legionella antigens negative, continue antibiotics, continue supplemental O2, follow cultures and clinical course   -on HFNC-- wean as able   Ecoli bacteremia -on IV abx -ID consult for help with abx  Pancytopenia - On admission, WBC 200 with ANC 140 and ALC 10; Hgb 5.5; platelets 6000  - Likely from chemotherapy, no overt bleeding  - 2 units RBC and 3 units of plts so far - oncology consulted- Adding GCSF daily until ANC > 1.5     Lung cancer  - Currently undergoing treatment with carboplatin and etoposide as well as Cosela and Imfinzi under the care of Dr. Julien Nordmann   -asked palliative care to follow while hospitalized    Hypokalemia  - Repelte along with Mg    Elevated LFTs  - Appears fairly stable, likely related to liver metastatic disease      Chronic HFpEF  - Appears compensated   - Monitor weights and  I/Os   -may need PRN lasix (BNP slightly elevated)   HTN  - Continue Coreg and losartan as tolerated     Chronic pain  - Prescription database reviewed  - does not appear to tolerate any opioids so will d/c   Prolonged QT interval  -  avoid QT-prolonging medications    Overall poor prognosis.  Discussed code status with wife.  Will transfer to ICU for close monitoring and frequent suctioning.  DVT prophylaxis: SCDs Start: 02/19/2022 2202    Code Status: Full Code Family Communication: wife at bedside  Disposition Plan:  Level of care: ICU Status is: Inpatient Remains inpatient appropriate because: sick    Consultants:  Oncology PCCM   Subjective: Pulled off O2  Objective: Vitals:   02/27/22 1123 02/27/22 1130 02/27/22 1145 02/27/22 1200  BP:  103/60 117/68 121/76  Pulse:  88 86 84  Resp:  (!) 21 (!) 23 (!) 29  Temp: 98.6 F (37 C)     TempSrc: Oral     SpO2:  96% 97% 95%  Weight:      Height:        Intake/Output Summary (Last 24 hours) at 02/27/2022 1243 Last data filed at 02/27/2022 1130 Gross per 24 hour  Intake 1704.4 ml  Output 2100 ml  Net -395.6 ml   Filed Weights   02/28/2022 1829 02/27/22 0314  Weight: 65.8 kg 68.8 kg    Examination:   General: Appearance:  Ill appearing male in no acute distress     Lungs:     diminished, respirations labored  Heart:    Normal heart rate.   MS:   All extremities are intact.   Neurologic:   Awake, alert- says he is in [place]: I have personally reviewed following labs and imaging studies  CBC: Recent Labs  Lab 02/21/22 1214 02/17/2022 2007 02/25/22 0452 02/25/22 1744 02/26/22 0622 02/27/22 0317  WBC 0.5* 0.2* 0.3* 0.6* 0.6* 1.2*  NEUTROABS 0.4* 0.1* 0.2*  --  0.4* 1.1*  HGB 8.9* 5.5* 8.4* 8.5* 8.2* 7.8*  HCT 26.9* 16.2* 23.6* 25.0* 25.0* 23.1*  MCV 101.1* 100.6* 91.1 93.6 95.1 94.7  PLT 34* 6* 11* 11* 6* 9*   Basic Metabolic Panel: Recent Labs  Lab 02/25/2022 1936  02/25/22 0452 02/25/22 1744 02/26/22 0622 02/27/22 0317  NA 142 145 146* 146* 144  K 2.3* 2.9* 3.4* 3.0* 2.8*  CL 93* 98 100 102 98  CO2 38* 36* 37* 33* 36*  GLUCOSE 265* 197* 119* 92 166*  BUN 24* 19 21 21 20   CREATININE 0.81 0.66 0.72 0.64 0.64  CALCIUM 7.5* 7.3* 7.7* 7.8* 7.8*  MG  --   --  1.6*  --   --   PHOS  --   --  1.8*  --   --    GFR: Estimated Creatinine Clearance: 82.4 mL/min (by C-G formula based on SCr of 0.64 mg/dL). Liver Function Tests: Recent Labs  Lab 02/21/22 1214 01/31/2022 1936 02/25/22 0452 02/26/22 0622 02/27/22 0317  AST 68* 90* 60* 50* 47*  ALT 98* 119* 100* 87* 83*  ALKPHOS 400* 229* 194* 173* 234*  BILITOT 1.1 1.0 1.5* 1.1 0.8  PROT 5.0* 4.4* 4.0* 4.6* 4.6*  ALBUMIN 2.8* 1.6* <1.5* <1.5* <1.5*   No results for input(s): "LIPASE", "AMYLASE" in the last 168 hours. No results for input(s): "AMMONIA" in the last 168 hours. Coagulation Profile: Recent Labs  Lab 02/05/2022 1936  INR 1.3*   Cardiac Enzymes: No results for input(s): "CKTOTAL", "CKMB", "CKMBINDEX", "TROPONINI" in the last 168 hours. BNP (last 3 results) No results for input(s): "PROBNP" in the last 8760 hours. HbA1C: No results for input(s): "HGBA1C" in the last 72 hours. CBG: Recent Labs  Lab 02/26/22 1128 02/26/22 2132 02/27/22 0640 02/27/22 0841 02/27/22 1121  GLUCAP 191* 209* 169* 188* 134*   Lipid Profile: No results for input(s): "CHOL", "HDL", "LDLCALC", "TRIG", "CHOLHDL", "LDLDIRECT" in the last 72 hours. Thyroid Function Tests: No results for input(s): "TSH", "T4TOTAL", "FREET4", "T3FREE", "THYROIDAB" in the last 72 hours. Anemia Panel: No results for input(s): "VITAMINB12", "FOLATE", "FERRITIN", "TIBC", "IRON", "RETICCTPCT" in the last 72 hours. Sepsis Labs: Recent Labs  Lab 02/11/2022 1936 02/28/2022 2029  LATICACIDVEN 1.6 1.3    Recent Results (from the past 240 hour(s))  Blood Culture (routine x 2)     Status: Abnormal   Collection Time: 02/13/2022  7:15  PM   Specimen: BLOOD  Result Value Ref Range Status   Specimen Description BLOOD SITE NOT SPECIFIED  Final   Special Requests   Final    BOTTLES DRAWN AEROBIC AND ANAEROBIC Blood Culture results may not be optimal due to an excessive volume of blood received in culture bottles   Culture  Setup Time   Final    GRAM NEGATIVE RODS IN BOTH AEROBIC AND ANAEROBIC BOTTLES CRITICAL RESULT CALLED TO, READ BACK BY AND VERIFIED WITH:  C/ PHARMD J. MILLEN 02/25/22 1324  A. LAFRANCE Performed at [facility], [place] [address] [place], [place] [address]    Culture ESCHERICHIA COLI (A)  Final   Report Status 02/27/2022 FINAL  Final   Organism ID, Bacteria ESCHERICHIA COLI  Final      Susceptibility   Escherichia coli - MIC*    AMPICILLIN >=32 RESISTANT Resistant     CEFAZOLIN <=4 SENSITIVE Sensitive     CEFEPIME <=0.12 SENSITIVE Sensitive     CEFTAZIDIME <=1 SENSITIVE Sensitive     CEFTRIAXONE <=0.25 SENSITIVE Sensitive     CIPROFLOXACIN <=0.25 SENSITIVE Sensitive     GENTAMICIN <=1 SENSITIVE Sensitive     IMIPENEM <=0.25 SENSITIVE Sensitive     TRIMETH/SULFA >=320 RESISTANT Resistant     AMPICILLIN/SULBACTAM >=32 RESISTANT Resistant     PIP/TAZO <=4 SENSITIVE Sensitive     * ESCHERICHIA COLI  Blood Culture ID Panel (Reflexed)     Status: Abnormal   Collection Time: 02/11/2022  7:15 PM  Result Value Ref Range Status   Enterococcus faecalis NOT DETECTED NOT DETECTED Final   Enterococcus Faecium NOT DETECTED NOT DETECTED Final   Listeria monocytogenes NOT DETECTED NOT DETECTED Final   Staphylococcus species NOT DETECTED NOT DETECTED Final   Staphylococcus aureus (BCID) NOT DETECTED NOT DETECTED Final   Staphylococcus epidermidis NOT DETECTED NOT DETECTED Final   Staphylococcus lugdunensis NOT DETECTED NOT DETECTED Final   Streptococcus species NOT DETECTED NOT DETECTED Final   Streptococcus agalactiae NOT DETECTED NOT DETECTED Final   Streptococcus pneumoniae NOT DETECTED NOT DETECTED  Final   Streptococcus pyogenes NOT DETECTED NOT DETECTED Final   A.calcoaceticus-baumannii NOT DETECTED NOT DETECTED Final   Bacteroides fragilis NOT DETECTED NOT DETECTED Final   Enterobacterales DETECTED (A) NOT DETECTED Final    Comment: Enterobacterales represent a large order of gram negative bacteria, not a single organism. CRITICAL RESULT CALLED TO, READ BACK BY AND VERIFIED WITH:  C/ PHARMD J. MILLEN 02/25/22 1324 A. LAFRANCE    Enterobacter cloacae complex NOT DETECTED NOT DETECTED Final   Escherichia coli DETECTED (A) NOT DETECTED Final    Comment: CRITICAL RESULT CALLED TO, READ BACK BY AND VERIFIED WITH:  C/ PHARMD J. MILLEN 02/25/22 1324 A. LAFRANCE    Klebsiella aerogenes NOT DETECTED NOT DETECTED Final   Klebsiella oxytoca NOT DETECTED NOT DETECTED Final   Klebsiella pneumoniae NOT DETECTED NOT DETECTED Final   Proteus species NOT DETECTED NOT DETECTED Final   Salmonella species NOT DETECTED NOT DETECTED Final   Serratia marcescens NOT DETECTED NOT DETECTED Final   Haemophilus influenzae NOT DETECTED NOT DETECTED Final   Neisseria meningitidis NOT DETECTED NOT DETECTED Final   Pseudomonas aeruginosa NOT DETECTED NOT DETECTED Final   Stenotrophomonas maltophilia NOT DETECTED NOT DETECTED Final   Candida albicans NOT DETECTED NOT DETECTED Final   Candida auris NOT DETECTED NOT DETECTED Final   Candida glabrata NOT DETECTED NOT DETECTED Final   Candida krusei NOT DETECTED NOT DETECTED Final   Candida parapsilosis NOT DETECTED NOT DETECTED Final   Candida tropicalis NOT DETECTED NOT DETECTED Final   Cryptococcus neoformans/gattii NOT DETECTED NOT DETECTED Final   CTX-M ESBL NOT DETECTED NOT DETECTED Final   Carbapenem resistance IMP NOT DETECTED NOT DETECTED Final   Carbapenem resistance KPC NOT DETECTED NOT DETECTED Final   Carbapenem resistance NDM NOT DETECTED NOT DETECTED Final   Carbapenem resist OXA 48 LIKE NOT DETECTED NOT DETECTED Final   Carbapenem resistance  VIM NOT DETECTED NOT DETECTED Final    Comment: Performed at  [facility], [place] [address], [place], [place] [address]  Blood Culture (routine x 2)     Status: Abnormal   Collection Time: 02/28/2022  7:30 PM   Specimen: BLOOD  Result Value Ref Range Status   Specimen Description BLOOD SITE NOT SPECIFIED  Final   Special Requests   Final    BOTTLES DRAWN AEROBIC AND ANAEROBIC Blood Culture results may not be optimal due to an excessive volume of blood received in culture bottles   Culture  Setup Time   Final    ANAEROBIC BOTTLE ONLY GRAM NEGATIVE RODS CRITICAL VALUE NOTED.  VALUE IS CONSISTENT WITH PREVIOUSLY REPORTED AND CALLED VALUE.    Culture (A)  Final    ESCHERICHIA COLI SUSCEPTIBILITIES PERFORMED ON PREVIOUS CULTURE WITHIN THE LAST 5 DAYS. Performed at [facility], [place] [address], [place], [place] [address]    Report Status 02/27/2022 FINAL  Final  Resp panel by RT-PCR (RSV, Flu A&B, Covid) Anterior Nasal Swab     Status: None   Collection Time: 02/25/22  8:59 AM   Specimen: Anterior Nasal Swab  Result Value Ref Range Status   SARS Coronavirus 2 by RT PCR NEGATIVE NEGATIVE Final    Comment: (NOTE) SARS-CoV-2 target nucleic acids are NOT DETECTED.  The SARS-CoV-2 RNA is generally detectable in upper respiratory specimens during the acute phase of infection. The lowest concentration of SARS-CoV-2 viral copies this assay can detect is 138 copies/mL. A negative result does not preclude SARS-Cov-2 infection and should not be used as the sole basis for treatment or other patient management decisions. A negative result may occur with  improper specimen collection/handling, submission of specimen other than nasopharyngeal swab, presence of viral mutation(s) within the areas targeted by this assay, and inadequate number of viral copies(<138 copies/mL). A negative result must be combined with clinical observations, patient history, and epidemiological information. The  expected result is Negative.  Fact Sheet for Patients:  EntrepreneurPulse.com.au  Fact Sheet for Healthcare Providers:  IncredibleEmployment.be  This test is no t yet approved or cleared by the [place] FDA and  has been authorized for detection and/or diagnosis of SARS-CoV-2 by FDA under an Emergency Use Authorization (EUA). This EUA will remain  in effect (meaning this test can be used) for the duration of the COVID-19 declaration under Section 564(b)(1) of the Act, 21 U.S.C.section 360bbb-3(b)(1), unless the authorization is terminated  or revoked sooner.       Influenza A by PCR NEGATIVE NEGATIVE Final   Influenza B by PCR NEGATIVE NEGATIVE Final    Comment: (NOTE) The Xpert Xpress SARS-CoV-2/FLU/RSV plus assay is intended as an aid in the diagnosis of influenza from Nasopharyngeal swab specimens and should not be used as a sole basis for treatment. Nasal washings and aspirates are unacceptable for Xpert Xpress SARS-CoV-2/FLU/RSV testing.  Fact Sheet for Patients: EntrepreneurPulse.com.au  Fact Sheet for Healthcare Providers: IncredibleEmployment.be  This test is not yet approved or cleared by the [place] FDA and has been authorized for detection and/or diagnosis of SARS-CoV-2 by FDA under an Emergency Use Authorization (EUA). This EUA will remain in effect (meaning this test can be used) for the duration of the COVID-19 declaration under Section 564(b)(1) of the Act, 21 U.S.C. section 360bbb-3(b)(1), unless the authorization is terminated or revoked.     Resp Syncytial Virus by PCR NEGATIVE NEGATIVE Final    Comment: (NOTE) Fact Sheet for Patients: EntrepreneurPulse.com.au  Fact Sheet for Healthcare Providers: IncredibleEmployment.be  This test is  not yet approved or cleared by the [place] and has been authorized for detection and/or  diagnosis of SARS-CoV-2 by FDA under an Emergency Use Authorization (EUA). This EUA will remain in effect (meaning this test can be used) for the duration of the COVID-19 declaration under Section 564(b)(1) of the Act, 21 U.S.C. section 360bbb-3(b)(1), unless the authorization is terminated or revoked.  Performed at [facility], [place] [address], [place], [place] [address]   MRSA Next Gen by PCR, Nasal     Status: None   Collection Time: 02/25/22  8:59 AM  Result Value Ref Range Status   MRSA by PCR Next Gen NOT DETECTED NOT DETECTED Final    Comment: (NOTE) The GeneXpert MRSA Assay (FDA approved for NASAL specimens only), is one component of a comprehensive MRSA colonization surveillance program. It is not intended to diagnose MRSA infection nor to guide or monitor treatment for MRSA infections. Test performance is not FDA approved in patients less than 92 years old. Performed at [facility], [place] [address] [place], [place] [address]          Radiology Studies: DG Chest Port 1 View  Result Date: 02/27/2022 CLINICAL DATA:  Hypoxia.  Pneumonia. EXAM: PORTABLE CHEST 1 VIEW COMPARISON:  02/26/2022 FINDINGS: Bibasilar interstitial and patchy airspace disease is similar to prior with persistent bilateral pleural effusions. Cardiopericardial silhouette is at upper limits of normal for size. Right Port-A-Cath again noted. Bones are diffusely demineralized. IMPRESSION: No substantial interval change. Persistent bibasilar interstitial and patchy airspace disease with bilateral pleural effusions. Electronically Signed   By: Misty Stanley M.D.   On: 02/27/2022 10:32   DG CHEST PORT 1 VIEW  Result Date: 02/26/2022 CLINICAL DATA:  Acute respiratory failure and hypoxia EXAM: PORTABLE CHEST 1 VIEW COMPARISON:  02/25/2022 FINDINGS: Cardiac shadow is stable. Right chest wall port is again seen and stable. Increasing bilateral airspace opacities are noted particularly in the right  apex. Additionally and increasing left-sided effusion is noted. No bony abnormality is noted. IMPRESSION: Increasing infiltrates particularly in the right apex. Increasing left basilar effusion. Electronically Signed   By: Inez Catalina M.D.   On: 02/26/2022 17:40   DG CHEST PORT 1 VIEW  Result Date: 02/25/2022 CLINICAL DATA:  Dyspnea. EXAM: PORTABLE CHEST 1 VIEW COMPARISON:  February 24, 2022. FINDINGS: Stable cardiomediastinal silhouette. Status post coronary bypass graft. Right internal jugular Port-A-Cath is unchanged. Stable bilateral lung opacities are noted concerning for pneumonia or edema. Bony thorax is unremarkable. Small bilateral pleural effusions may be present. IMPRESSION: Stable bilateral lung opacities and probable associated pleural effusions. Electronically Signed   By: Marijo Conception M.D.   On: 02/25/2022 18:41        Scheduled Meds:  Chlorhexidine Gluconate Cloth  6 each Topical Daily   insulin aspart  0-15 Units Subcutaneous TID WC   insulin aspart  0-5 Units Subcutaneous QHS   potassium chloride  20 mEq Oral BID   sodium chloride flush  10-40 mL Intracatheter Q12H   sodium chloride flush  3 mL Intravenous Q12H   sucralfate  1 g Oral QID   Tbo-filgastrim (GRANIX) SQ  480 mcg Subcutaneous q1800   Continuous Infusions:  ceFEPime (MAXIPIME) IV     magnesium sulfate bolus IVPB 2 g (02/27/22 1237)   metronidazole     potassium chloride 10 mEq (02/27/22 1133)     LOS: 3 days    Time spent: 45 minutes spent on chart review, discussion with  nursing staff, consultants, updating family and interview/physical exam; more than 50% of that time was spent in counseling and/or coordination of care.    Geradine Girt, DO [facility] Available via Epic secure chat 7am-7pm After these hours, please refer to coverage provider listed on [email or web address] 02/27/2022, 12:43 PM

## 2022-02-27 NOTE — Progress Notes (Signed)
Contacted [facility], BP 84/57, map 66. Heart rate 87, waited 15 minutes and recycled BP now 82/49 map 60. Patient arousal, able to follow commands. Contacted Rapid Response and informed. Patient was given lasix 20mg  yesterday and has had 2600 output since.

## 2022-02-27 NOTE — Progress Notes (Signed)
Potassium 2.8 called into elink

## 2022-02-27 NOTE — Progress Notes (Addendum)
RT deep suctioned pt's throat and removed an approximately 6 inch bloody casting, maybe 17mm wide.  Pt sounded better once casting was removed.  Marni Griffon aware as well as Rapid Network engineer.  RT will continue to monitor.

## 2022-02-27 NOTE — Progress Notes (Signed)
NAME:  Kelly Olson., MRN:  [id number], DOB:  Apr 29, 1950, LOS: 3 ADMISSION DATE:  02/14/2022, CONSULTATION DATE:  02/25/22 REFERRING MD:  Dr. 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  Denied recent fever, chills, N/V.  No change in his chronic bilateral LE edema, bruising more but no bleeding.  Admitted one month ago with PNA and AECOPD discharged on new home O2 2L.    On evaluation, found with SIRS, CXR c/w emphysema and L> R airspace disease with K 2.3, bicarb 38, elevated LFTs, BNP 689, Hgb 5.5, plts 6, WBC 0.3.  Oncology following.  1/27 with worsening respiratory distress, increasing O2 needs, and decreasing mental status, PCCM consulted further recs.  Patient complains of mild SOB, denies N/V or abd pain, mild neck discomfort.   Pertinent  Medical History   Past Medical History:  Diagnosis Date   Coronary artery disease, occlusive 06/02/2014   Multivessel CAD.mLAD-100%, mRCA 99%, dRCA 100%, mLCX 90%  And EF 35-45%. -->  Referred for CABG; nonischemic Myoview March 2020   Former heavy tobacco smoker    Quit in April 2016    GERD (gastroesophageal reflux disease)    Hyperlipidemia with target LDL less than 70    Hypertension    Ischemic cardiomyopathy - resolved 05/2014   Myoview: EF ~33% with "infarction vs. severe resting ischemia in LAD & RCA territory; b) EF by Cath: 35-45%. 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   Stage T2 a, N0   Significant Hospital Events: Including procedures, antibiotic start and stop dates in addition to other pertinent events   1/26 admitted TRH sepsis, PNA, pancytopenia, neutropenic fever 1/27 started on heated high flow for worsening shortness of breath.  Blood cultures positive for E. coli. 1/29 pt took oxygen off. Desaturated down to 70s. Cyanotic. Got some narcan too. Rapid response called. Requiring sxn to assist w/ cough Moved to ICU   Interim History / Subjective:  No distress but did almost code this am when O2 off. Very limited reserves.   Objective   Blood pressure (Abnormal) 101/53, pulse (Abnormal) 106, temperature 98.4 F (36.9 C), temperature source Axillary, resp. rate 19, height 6' (1.829 m), weight 68.8 kg, SpO2 96 %.    FiO2 (%):  [50 %-100 %] 50 %   Intake/Output Summary (Last 24 hours) at 02/27/2022 0942 Last data filed at 02/27/2022 0300 Gross per 24 hour  Intake 1424.4 ml  Output 2600 ml  Net -1175.6 ml   Filed Weights   02/12/2022 1829 02/27/22 0314  Weight: 65.8 kg 68.8 kg   Examination:  General: Chronically ill 72 year old male patient resting in bed currently no acute distress on heated high flow HEENT normocephalic atraumatic mucous membranes moist Pulmonary: Coarse scattered rhonchi, very poor cough mechanics very weak cough.  Currently on heated high flow Cardiac:  Tachycardic regular rhythm without murmur rub or gallop Abdomen: Soft nontender no organomegaly Extremities: Trace lower extremity edema, scattered areas of ecchymosis Neuro: Awake oriented GU: Clear yellow  Assessment & Plan:   Acute on chronic hypoxic respiratory failure  PNA,  CAP +/- aspiration Emphysema  Plan Continuing heated high flow Wean oxygen for saturations greater than 90% Repeat CXR today if effusion on left looks worse we may need to consider thoracentesis. (Will need to check his platelets first) Abx as per below   Aspiration precautions  Hold lasix today  Add HT saline neb Cont BDs NPO except meds Cont to address goals of care/ would do terrible on vent   Sepsis secondary to PNA and E. Coli bacteremia  Plan Day 4 doxy and CTX.  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 Julien Nordmann, on carboplatin, etoposide, cosela, imfinzi s/p XRT brain - oncology following Plan primary team re-consulting PMT for ongoing [place] Careful w/ narc dosing will DC MS Contin for now  Acquired pancytopenia 2/2 chemo Plan Trend cbc goal plts >10k->getting platelets today  transfuse prn for Hgb < 7 GCSF till [place] > 1.5   Hypokalemia  Plan Replace and recheck   Elevated LFTs - suspected from chemo Plan Repeat LFTs am   Prolonged Qtc Plan tele monitoring avoid Qtc prolonging meds  HTN HFpEF Plan Cont coreg and losartan   DM Plan  SSI  Protein calorie malnutrition Plan recs per RD consult  Best Practice (right click and "Reselect all SmartList Selections" daily)   Diet/type: NPO w/ oral meds DVT prophylaxis: SCD GI prophylaxis: PPI Lines: N/A Foley:  N/A Code Status:  full code Last date of multidisciplinary goals of care discussion [1/26]  Erick Colace ACNP-BC [place] Pager # [phone] OR # [phone] if no answer

## 2022-02-27 NOTE — Progress Notes (Signed)
SLP Cancellation Note  Patient Details Name: Kelly Olson. MRN: [id number] DOB: 23-Sep-1950   Cancelled treatment:       Reason Eval/Treat Not Completed: Patient not medically ready, Given respiratory distress event this am and tx to ICU with increased O2 needs, will defer plan for MBS today. Will check on pt tomorrow for readiness to resume swallowing therapy.    Azrael Maddix, Katherene Ponto 02/27/2022, 12:06 PM

## 2022-02-27 NOTE — Consult Note (Signed)
Consultation Note Date: 02/27/2022   Patient Name: Kelly Olson.  DOB: Jun 26, 1950  MRN: [id number]  Age / Sex: 72 y.o., male  PCP: Wenda Low, MD Referring Physician: Geradine Girt, DO  Reason for Consultation: Establishing goals of care  HPI/Patient Profile: 72 y.o. 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 Coli bacteremia, pancytopenia, thrombocytopenia, neutropenic fever, elevated LFTs.   Clinical Assessment and Goals of Care: Consult received and extensive chart review completed. I met with Kelly Olson with no family initially at bedside. Kelly Olson is pleasantly confused and has no recollection of events of this morning. He has no concerns or complaints.   I met with Kelly Olson who was just out in the waiting area with her family member and support, Kelly Olson. Kelly Olson, Kelly Olson, and I had a conversation while [name] slept. Kelly Olson shares that she has had conversations with Dr. Eliseo Squires and also Marni Griffon, NP over the past days. She is appreciative of their compassionate communication but also honesty. Kelly Olson tells me that she knows the decision she has at hand. She tearfully shares that she knows that she does not want him to suffer. She agrees with DNR but expresses she does not want Kelly Olson to feel that she has "given up on him." Kelly Olson and I reassure her that this is not giving up but protecting him from suffering. She knows this is the case and that he would want the same for her in the situation. We discussed quality of life, suffering, and poor prognosis. We discussed that the next 24-48 hours will give [place] more information and expectations. I will continue to follow and support.  Update: Kelly Olson does  not want Kelly Olson to know about DNR in his confused state. We will discuss with him if he becomes more oriented to discuss. They had a bad experience with a poor conversation regarding DNR with a provider at [facility]. Will ask nursing NOT to place DNR bracelet or speak about DNR in front of [name] at this time.   Primary Decision Maker NEXT OF KIN Kelly Olson    SUMMARY OF RECOMMENDATIONS   - DNR decided by wife (patient unable to participate in conversation) - Ongoing goals of care conversations  Code Status/Advance Care Planning: DNR   Symptom Management:  Per attending and PCCM.   Prognosis:  Overall prognosis guarded.   Discharge Planning: To Be Determined      Primary Diagnoses: Present on Admission:  Pneumonia  Primary malignant neoplasm of lung with metastasis to brain [facility])  Hypokalemia  Coronary artery disease involving native coronary artery without angina pectoris  Chronic pain  Acute on chronic respiratory failure with hypoxemia (HCC)  Elevated LFTs  Prolonged QT interval  Pancytopenia (HCC)  Type 2 diabetes mellitus with hyperglycemia ([facility])   I have reviewed the medical record, interviewed the  patient and family, and examined the patient. The following aspects are pertinent.  Past Medical History:  Diagnosis Date   Coronary artery disease, occlusive 06/02/2014   Multivessel CAD.mLAD-100%, mRCA 99%, dRCA 100%, mLCX 90%  And EF 35-45%. -->  Referred for CABG; nonischemic Myoview March 2020   Former heavy tobacco smoker    Quit in April 2016    GERD (gastroesophageal reflux disease)    Hyperlipidemia with target LDL less than 70    Hypertension    Ischemic cardiomyopathy - resolved 05/2014   Myoview: EF ~33% with "infarction vs. severe resting ischemia in LAD & RCA territory; b) EF by Cath: 35-45%. 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   Stage T2 a, N0   Social History   Socioeconomic History   Marital status: Married    Spouse name: Not on file   Number of children: Not on file   Years of education: Not on file   Highest education level: Not on file  Occupational History   Not on file  Tobacco Use   Smoking status: Former    Packs/day: 0.80    Years: 46.00    Total pack years: 36.80    Types: Cigarettes    Quit date: 05/29/2014    Years since quitting: 7.7   Smokeless tobacco: Never  Vaping Use   Vaping Use: Never used  Substance and Sexual Activity   Alcohol use: Yes    Alcohol/week: 7.0 standard drinks of alcohol    Types: 7 Glasses of wine per week   Drug use: No   Sexual activity: Yes  Other Topics Concern   Not on file  Social History Narrative   Wife recently diagnosed with early stage breast cancer   Social Determinants of Health   Financial Resource Strain: Not on file  Food Insecurity: No Food Insecurity (01/26/2022)   Hunger Vital Sign    Worried About Running Out of Food in the Last Year: Never true    Ran Out of Food in the Last Year: Never true  Transportation Needs: No Transportation Needs (01/26/2022)   PRAPARE - Hydrologist (Medical): No    Lack of Transportation (Non-Medical): No  Physical Activity: Not on file  Stress: Not on file  Social Connections: Not on file   Family History  Problem Relation Age of Onset   Emphysema Mother    Cardiomyopathy Father    Healthy Sister    Heart attack Maternal Grandfather    Healthy Sister    Heart disease Paternal Uncle    Scheduled Meds:  Chlorhexidine Gluconate Cloth  6 each Topical Daily   insulin aspart  0-15 Units Subcutaneous TID WC   insulin aspart  0-5 Units Subcutaneous QHS   potassium chloride  20 mEq Oral BID   sodium chloride flush  10-40 mL  Intracatheter Q12H   sodium chloride flush  3 mL Intravenous Q12H   sucralfate  1 g Oral QID   Tbo-filgastrim (GRANIX) SQ  480 mcg Subcutaneous q1800   Continuous Infusions:  ceFEPime (MAXIPIME) IV 1 g (02/27/22 0117)   doxycycline (VIBRAMYCIN) IV 100 mg (02/26/22 2219)   magnesium sulfate bolus  IVPB     potassium chloride     PRN Meds:.acetaminophen **OR** acetaminophen, albuterol, bisacodyl, lidocaine-prilocaine, naLOXone (NARCAN)  injection, polyethylene glycol, sodium chloride flush No Known Allergies Review of Systems  Unable to perform ROS: Acuity of condition    Physical Exam Vitals and nursing note reviewed.  Constitutional:      General: He is not in acute distress.    Appearance: He is ill-appearing.  Cardiovascular:     Rate and Rhythm: Tachycardia present.  Pulmonary:     Effort: Tachypnea present. No accessory muscle usage or respiratory distress.  Abdominal:     General: Abdomen is flat.  Neurological:     Mental Status: He is alert. He is confused.     Vital Signs: BP 96/67   Pulse (!) 110   Temp 98.4 F (36.9 C) (Axillary)   Resp (!) 27   Ht 6' (1.829 m)   Wt 68.8 kg   SpO2 98%   BMI 20.57 kg/m  Pain Scale: 0-10   Pain Score: 0-No pain   SpO2: SpO2: 98 % O2 Device:SpO2: 98 % O2 Flow Rate: .O2 Flow Rate (L/min): 60 L/min  IO: Intake/output summary:  Intake/Output Summary (Last 24 hours) at 02/27/2022 1109 Last data filed at 02/27/2022 0300 Gross per 24 hour  Intake 1424.4 ml  Output 2100 ml  Net -675.6 ml    LBM:   Baseline Weight: Weight: 65.8 kg Most recent weight: Weight: 68.8 kg     Palliative Assessment/Data:     Time In: 1200  Time Total: 80 min  Greater than 50%  of this time was spent counseling and coordinating care related to the above assessment and plan.  Signed by: Vinie Sill, NP Palliative Medicine Team Pager # [phone] (M-F 8a-5p) Team Phone # [phone] (Nights/Weekends)

## 2022-02-27 NOTE — Progress Notes (Signed)
RT called d/t rapid response bc pt's sat in 70s.  RT arrived and pt was 96%.  RT deep suctioned pt's throat and removed copious amount of brown, thick secretions.  Pt left on 50% and 60LPM.

## 2022-02-27 NOTE — Progress Notes (Signed)
Bedside RN found patient unresponsive, with heated high flow removed. Pressures in the 60s and O2 sats in the 70s. Rapid response nurse and charge nurse notified and bedside. Attending notified and bedside.

## 2022-02-27 NOTE — Consult Note (Signed)
Date of Admission:  02/10/2022          Reason for Consult:   E coli bacteremia Referring Provider: Eulogio Bear, MD   Assessment:  E coli bacteremia with concern for port-a-cath infection Small cell lung carcinoma with metastases to bone and brain ? pneumonia Chronic and acute hypoxic respiratory failure COPD Coronary artery disease with ischemic cardiomyopathy Pancytopenia in context of chemotherapy Encephalopathy   Plan:  Continue cefepime for now and add metronidazole (he does nto need such broad coverage for his bacteremia but we are also covering for possible pneumonia as well Repeat blood cultures I would prefer if his port is removed to ensure we can clear his bacteremia. Strongly encourage palliative care consult  Principal Problem:   E coli bacteremia Active Problems:   Coronary artery disease involving native coronary artery without angina pectoris   Chronic pain   Primary malignant neoplasm of lung with metastasis to brain (HCC)   Acute on chronic respiratory failure with hypoxemia (HCC)   Hypokalemia   Elevated LFTs   Pneumonia   Prolonged QT interval   Pancytopenia (HCC)   Type 2 diabetes mellitus with hyperglycemia (HCC)   Malignant neoplasm of mediastinum, part unspecified (HCC)   Scheduled Meds:  Chlorhexidine Gluconate Cloth  6 each Topical Daily   insulin aspart  0-15 Units Subcutaneous TID WC   insulin aspart  0-5 Units Subcutaneous QHS   potassium chloride  20 mEq Oral BID   sodium chloride flush  10-40 mL Intracatheter Q12H   sodium chloride flush  3 mL Intravenous Q12H   sucralfate  1 g Oral QID   Tbo-filgastrim (GRANIX) SQ  480 mcg Subcutaneous q1800   Continuous Infusions:  ceFEPime (MAXIPIME) IV     metronidazole     potassium chloride 10 mEq (02/27/22 1605)   potassium chloride     PRN Meds:.acetaminophen **OR** acetaminophen, albuterol, bisacodyl, lidocaine-prilocaine, naLOXone (NARCAN)  injection, polyethylene glycol,  potassium chloride, sodium chloride flush  HPI: Kelly Olson. is a 72 y.o. male with small cell lung carcinoma with metastases to bone and brain, COPD with chronic hypoxic respiratory failure coronary disease who presented to January 26 with worsening shortness of breath found to be hypoxic in the 60s.  He was found to have pancytopenia in the context of receipt of chemotherapy He was encephalopathic  Chest x-ray showed multifocal infiltrates though not clear how much of this is lung cancer and how much could be bacterial infection.  He has been started on ceftriaxone and doxycycline after blood cultures were drawn  Blood cultures were obtained which have subsequent grown E. coli.   He does have a Port-A-Cath in place which could certainly be seeded with E. coli and could also be the source of his E. coli bacteremia  Now we will continue cefepime and give him some anaerobic coverage with metronidazole.  After completing course of treatment for his lungs would narrow down to an antibiotic specific for the E. coli in the blood.  I think he will need to have his port removed to accomplish clearance of his bacteremia and to prevent recurrence.  His chemotherapy will need to be held in the interim as well.   I spent 82 minutes with the patient including than 50% of the time in face to face counseling of the patient has E. coli bacteremia his metastatic lung cancer with potential superimposed pneumonia, chronic and acute respiratory failure, pancytopenia in the context chemotherapy  personally reviewing chest x-ray along with review of medical records in preparation for the visit and during the visit and in coordination of nis care.   Review of Systems: Review of Systems  Unable to perform ROS: Critical illness    Past Medical History:  Diagnosis Date   Coronary artery disease, occlusive 06/02/2014   Multivessel CAD.mLAD-100%, mRCA 99%, dRCA 100%, mLCX 90%  And EF 35-45%. -->   Referred for CABG; nonischemic Myoview March 2020   Former heavy tobacco smoker    Quit in April 2016    GERD (gastroesophageal reflux disease)    Hyperlipidemia with target LDL less than 70    Hypertension    Ischemic cardiomyopathy - resolved 05/2014   Myoview: EF ~33% with "infarction vs. severe resting ischemia in LAD & RCA territory; b) EF by Cath: 35-45%. 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   Stage T2 a, N0    Social History   Tobacco Use   Smoking status: Former    Packs/day: 0.80    Years: 46.00    Total pack years: 36.80    Types: Cigarettes    Quit date: 05/29/2014    Years since quitting: 7.7   Smokeless tobacco: Never  Vaping Use   Vaping Use: Never used  Substance Use Topics   Alcohol use: Yes    Alcohol/week: 7.0 standard drinks of alcohol    Types: 7 Glasses of wine per week   Drug use: No    Family History  Problem Relation Age of Onset   Emphysema Mother    Cardiomyopathy Father    Healthy Sister    Heart attack Maternal Grandfather    Healthy Sister    Heart disease Paternal Uncle    No Known Allergies  OBJECTIVE: Blood pressure 126/62, pulse 91, temperature 98.6 F (37 C), temperature source Oral, resp. rate 17, height 6' (1.829 m), weight 68.8 kg, SpO2 94 %.  Physical Exam Constitutional:      Appearance: He is ill-appearing.  Eyes:     General:        Right eye: No discharge.        Left eye: No discharge.     Extraocular Movements: Extraocular movements intact.  Cardiovascular:     Rate and Rhythm: Tachycardia present.     Heart sounds: No murmur heard.    No friction rub. No gallop.  Pulmonary:     Effort: Pulmonary effort is normal.     Breath sounds: Rhonchi  present. No wheezing.  Abdominal:     General: Bowel sounds are normal. There is no distension.     Palpations: There is no mass.     Tenderness: There is no abdominal tenderness.  Musculoskeletal:        General: Normal range of motion.  Skin:    General: Skin is dry.     Findings: Bruising present.  Neurological:     General: No focal deficit present.     Mental Status: He is alert.  Psychiatric:        Mood and Affect: Mood normal.        Speech: Speech normal.        Behavior:  Behavior is cooperative.        Cognition and Memory: Cognition is impaired. Memory is impaired. He exhibits impaired recent memory and impaired remote memory.     Lab Results Lab Results  Component Value Date   WBC 1.2 (LL) 02/27/2022   HGB 7.8 (L) 02/27/2022   HCT 23.1 (L) 02/27/2022   MCV 94.7 02/27/2022   PLT 9 (LL) 02/27/2022    Lab Results  Component Value Date   CREATININE 0.64 02/27/2022   BUN 20 02/27/2022   NA 144 02/27/2022   K 2.8 (L) 02/27/2022   CL 98 02/27/2022   CO2 36 (H) 02/27/2022    Lab Results  Component Value Date   ALT 83 (H) 02/27/2022   AST 47 (H) 02/27/2022   ALKPHOS 234 (H) 02/27/2022   BILITOT 0.8 02/27/2022     Microbiology: Recent Results (from the past 240 hour(s))  Blood Culture (routine x 2)     Status: Abnormal   Collection Time: 02/09/2022  7:15 PM   Specimen: BLOOD  Result Value Ref Range Status   Specimen Description BLOOD SITE NOT SPECIFIED  Final   Special Requests   Final    BOTTLES DRAWN AEROBIC AND ANAEROBIC Blood Culture results may not be optimal due to an excessive volume of blood received in culture bottles   Culture  Setup Time   Final    GRAM NEGATIVE RODS IN BOTH AEROBIC AND ANAEROBIC BOTTLES CRITICAL RESULT CALLED TO, READ BACK BY AND VERIFIED WITH:  C/ PHARMD J. MILLEN 02/25/22 1324 A. LAFRANCE Performed at [facility], [place] [address], [place], [place] [address]    Culture ESCHERICHIA COLI (A)  Final   Report Status  02/27/2022 FINAL  Final   Organism ID, Bacteria ESCHERICHIA COLI  Final      Susceptibility   Escherichia coli - MIC*    AMPICILLIN >=32 RESISTANT Resistant     CEFAZOLIN <=4 SENSITIVE Sensitive     CEFEPIME <=0.12 SENSITIVE Sensitive     CEFTAZIDIME <=1 SENSITIVE Sensitive     CEFTRIAXONE <=0.25 SENSITIVE Sensitive     CIPROFLOXACIN <=0.25 SENSITIVE Sensitive     GENTAMICIN <=1 SENSITIVE Sensitive     IMIPENEM <=0.25 SENSITIVE Sensitive     TRIMETH/SULFA >=320 RESISTANT Resistant     AMPICILLIN/SULBACTAM >=32 RESISTANT Resistant     PIP/TAZO <=4 SENSITIVE Sensitive     * ESCHERICHIA COLI  Blood Culture ID Panel (Reflexed)     Status: Abnormal   Collection Time: 02/11/2022  7:15 PM  Result Value Ref Range Status   Enterococcus faecalis NOT DETECTED NOT DETECTED Final   Enterococcus Faecium NOT DETECTED NOT DETECTED Final   Listeria monocytogenes NOT DETECTED NOT DETECTED Final   Staphylococcus species NOT DETECTED NOT DETECTED Final   Staphylococcus aureus (BCID) NOT DETECTED NOT DETECTED Final   Staphylococcus epidermidis NOT DETECTED NOT DETECTED Final   Staphylococcus lugdunensis NOT DETECTED NOT DETECTED Final   Streptococcus species NOT DETECTED NOT DETECTED Final   Streptococcus agalactiae NOT DETECTED NOT DETECTED Final   Streptococcus pneumoniae NOT DETECTED NOT DETECTED Final   Streptococcus pyogenes NOT DETECTED NOT DETECTED Final   A.calcoaceticus-baumannii NOT DETECTED NOT DETECTED Final   Bacteroides fragilis NOT DETECTED NOT DETECTED Final   Enterobacterales DETECTED (A) NOT DETECTED Final    Comment: Enterobacterales represent a large order of gram negative bacteria, not a single organism. CRITICAL RESULT CALLED TO, READ BACK BY AND VERIFIED WITH:  C/ PHARMD J. MILLEN 02/25/22 1324 A. LAFRANCE  Enterobacter cloacae complex NOT DETECTED NOT DETECTED Final   Escherichia coli DETECTED (A) NOT DETECTED Final    Comment: CRITICAL RESULT CALLED TO, READ BACK BY AND  VERIFIED WITH:  C/ PHARMD J. MILLEN 02/25/22 1324 A. LAFRANCE    Klebsiella aerogenes NOT DETECTED NOT DETECTED Final   Klebsiella oxytoca NOT DETECTED NOT DETECTED Final   Klebsiella pneumoniae NOT DETECTED NOT DETECTED Final   Proteus species NOT DETECTED NOT DETECTED Final   Salmonella species NOT DETECTED NOT DETECTED Final   Serratia marcescens NOT DETECTED NOT DETECTED Final   Haemophilus influenzae NOT DETECTED NOT DETECTED Final   Neisseria meningitidis NOT DETECTED NOT DETECTED Final   Pseudomonas aeruginosa NOT DETECTED NOT DETECTED Final   Stenotrophomonas maltophilia NOT DETECTED NOT DETECTED Final   Candida albicans NOT DETECTED NOT DETECTED Final   Candida auris NOT DETECTED NOT DETECTED Final   Candida glabrata NOT DETECTED NOT DETECTED Final   Candida krusei NOT DETECTED NOT DETECTED Final   Candida parapsilosis NOT DETECTED NOT DETECTED Final   Candida tropicalis NOT DETECTED NOT DETECTED Final   Cryptococcus neoformans/gattii NOT DETECTED NOT DETECTED Final   CTX-M ESBL NOT DETECTED NOT DETECTED Final   Carbapenem resistance IMP NOT DETECTED NOT DETECTED Final   Carbapenem resistance KPC NOT DETECTED NOT DETECTED Final   Carbapenem resistance NDM NOT DETECTED NOT DETECTED Final   Carbapenem resist OXA 48 LIKE NOT DETECTED NOT DETECTED Final   Carbapenem resistance VIM NOT DETECTED NOT DETECTED Final    Comment: Performed at [facility], [address] [place], [place] [address]  Blood Culture (routine x 2)     Status: Abnormal   Collection Time: 02/08/2022  7:30 PM   Specimen: BLOOD  Result Value Ref Range Status   Specimen Description BLOOD SITE NOT SPECIFIED  Final   Special Requests   Final    BOTTLES DRAWN AEROBIC AND ANAEROBIC Blood Culture results may not be optimal due to an excessive volume of blood received in culture bottles   Culture  Setup Time   Final    ANAEROBIC BOTTLE ONLY GRAM NEGATIVE RODS CRITICAL VALUE NOTED.  VALUE IS CONSISTENT WITH  PREVIOUSLY REPORTED AND CALLED VALUE.    Culture (A)  Final    ESCHERICHIA COLI SUSCEPTIBILITIES PERFORMED ON PREVIOUS CULTURE WITHIN THE LAST 5 DAYS. Performed at [facility], [place] [address], [place], [place] [address]    Report Status 02/27/2022 FINAL  Final  Resp panel by RT-PCR (RSV, Flu A&B, Covid) Anterior Nasal Swab     Status: None   Collection Time: 02/25/22  8:59 AM   Specimen: Anterior Nasal Swab  Result Value Ref Range Status   SARS Coronavirus 2 by RT PCR NEGATIVE NEGATIVE Final    Comment: (NOTE) SARS-CoV-2 target nucleic acids are NOT DETECTED.  The SARS-CoV-2 RNA is generally detectable in upper respiratory specimens during the acute phase of infection. The lowest concentration of SARS-CoV-2 viral copies this assay can detect is 138 copies/mL. A negative result does not preclude SARS-Cov-2 infection and should not be used as the sole basis for treatment or other patient management decisions. A negative result may occur with  improper specimen collection/handling, submission of specimen other than nasopharyngeal swab, presence of viral mutation(s) within the areas targeted by this assay, and inadequate number of viral copies(<138 copies/mL). A negative result must be combined with clinical observations, patient history, and epidemiological information. The expected result is Negative.  Fact Sheet for Patients:  EntrepreneurPulse.com.au  Fact Sheet for Healthcare Providers:  IncredibleEmployment.be  This test is no t yet approved or cleared by the [place] FDA and  has been authorized for detection and/or diagnosis of SARS-CoV-2 by FDA under an Emergency Use Authorization (EUA). This EUA will remain  in effect (meaning this test can be used) for the duration of the COVID-19 declaration under Section 564(b)(1) of the Act, 21 U.S.C.section 360bbb-3(b)(1), unless the authorization is terminated  or revoked sooner.        Influenza A by PCR NEGATIVE NEGATIVE Final   Influenza B by PCR NEGATIVE NEGATIVE Final    Comment: (NOTE) The Xpert Xpress SARS-CoV-2/FLU/RSV plus assay is intended as an aid in the diagnosis of influenza from Nasopharyngeal swab specimens and should not be used as a sole basis for treatment. Nasal washings and aspirates are unacceptable for Xpert Xpress SARS-CoV-2/FLU/RSV testing.  Fact Sheet for Patients: EntrepreneurPulse.com.au  Fact Sheet for Healthcare Providers: IncredibleEmployment.be  This test is not yet approved or cleared by the [place] FDA and has been authorized for detection and/or diagnosis of SARS-CoV-2 by FDA under an Emergency Use Authorization (EUA). This EUA will remain in effect (meaning this test can be used) for the duration of the COVID-19 declaration under Section 564(b)(1) of the Act, 21 U.S.C. section 360bbb-3(b)(1), unless the authorization is terminated or revoked.     Resp Syncytial Virus by PCR NEGATIVE NEGATIVE Final    Comment: (NOTE) Fact Sheet for Patients: EntrepreneurPulse.com.au  Fact Sheet for Healthcare Providers: IncredibleEmployment.be  This test is not yet approved or cleared by the [place] FDA and has been authorized for detection and/or diagnosis of SARS-CoV-2 by FDA under an Emergency Use Authorization (EUA). This EUA will remain in effect (meaning this test can be used) for the duration of the COVID-19 declaration under Section 564(b)(1) of the Act, 21 U.S.C. section 360bbb-3(b)(1), unless the authorization is terminated or revoked.  Performed at [facility], [place] [address] [place], [place] [address]   MRSA Next Gen by PCR, Nasal     Status: None   Collection Time: 02/25/22  8:59 AM  Result Value Ref Range Status   MRSA by PCR Next Gen NOT DETECTED NOT DETECTED Final    Comment: (NOTE) The GeneXpert MRSA Assay (FDA  approved for NASAL specimens only), is one component of a comprehensive MRSA colonization surveillance program. It is not intended to diagnose MRSA infection nor to guide or monitor treatment for MRSA infections. Test performance is not FDA approved in patients less than 27 years old. Performed at [facility], [place] [address], [place], [place] [address]     Alcide Evener, [place] [facility] [facility] [phone] pager  02/27/2022, 4:35 PM

## 2022-02-27 NOTE — Significant Event (Signed)
Rapid Response Event Note   Reason for Call :  Hypotension  BP 58/48   Initial Focused Assessment:  Apparently patient had removed his Heated High flow oxygen.  When RN entered the room he was unresponsive, purple, breathing shallowly, and hypotensive.  Upon my arrival patient is again on his heated high flow and NRB. He is minimally responsive breathing shallowly, dusky.  BP 77/52  HR 99  RR 13-18  O2 sat unable to obtain (sat probe replaced)  Dr Eliseo Squires came to bedside  Interventions:  Increased O2 0.4 Narcan IV 100 cc NS bolus (bolus stopped when patient began to improve) NT suction Placed in Chair position Platelet transfusion started  Reassessment:  Patient is waking up, Alert and answering questions.  His skin color is much improved He is able to cough but it is very  weak, unable to cough up secretions. BP 115/74  ST 108  RR 20-30  O2 sat 98% on Heated HF 60L/60%    Attempted to wean O2 to 50% O2 sats dropped to 90%.  Placed back on 60% Fio2    Plan of Care:   Transfer to ICU    Event Summary:   MD Notified: Vann Call Time: [phone] Arrival Time: 2820 End Time: [place]  Raliegh Ip, RN

## 2022-02-27 NOTE — Progress Notes (Signed)
[facility] night cross cover note:   I was notified by RN of patient's potassium level of 2.8 this AM. I ordered Kcl 40 meq po x 1 now (in addition to existing order for Kcl 20 meq po bid).      Babs Bertin, DO Hospitalist

## 2022-02-27 NOTE — Progress Notes (Signed)
RT assisted with transport of this pt from 4E04 to [place] while on 15L NRB. Pt tolerated well with SVS. Once in 2H25 RT placed pt back on HHFNC 60L 50%. RN currently at bedside.

## 2022-02-27 NOTE — Progress Notes (Signed)
[facility] Progress Note Patient Name: Abdirizak Richison. DOB: [birth year] MRN: [id number]   Date of Service  02/27/2022  HPI/Events of Note  [facility] contacted regarding hypotension with SBP 80s. Rapid response contacted since patient on floor service with [facility]  eICU Interventions  Rapid response eval pending. If patient needs evaluation, will need to contact ground team.       Intervention Category Intermediate Interventions: Other:  Maurio Baize Rodman Pickle 02/27/2022, 4:35 AM

## 2022-02-28 ENCOUNTER — Inpatient Hospital Stay: Payer: Medicare Other

## 2022-02-28 ENCOUNTER — Telehealth: Payer: Self-pay | Admitting: Medical Oncology

## 2022-02-28 ENCOUNTER — Inpatient Hospital Stay (HOSPITAL_COMMUNITY): Payer: Medicare Other

## 2022-02-28 DIAGNOSIS — J90 Pleural effusion, not elsewhere classified: Secondary | ICD-10-CM

## 2022-02-28 DIAGNOSIS — J9621 Acute and chronic respiratory failure with hypoxia: Secondary | ICD-10-CM | POA: Diagnosis not present

## 2022-02-28 DIAGNOSIS — B9620 Unspecified Escherichia coli [E. coli] as the cause of diseases classified elsewhere: Secondary | ICD-10-CM | POA: Diagnosis not present

## 2022-02-28 DIAGNOSIS — R7881 Bacteremia: Secondary | ICD-10-CM

## 2022-02-28 DIAGNOSIS — I2510 Atherosclerotic heart disease of native coronary artery without angina pectoris: Secondary | ICD-10-CM | POA: Diagnosis not present

## 2022-02-28 DIAGNOSIS — C383 Malignant neoplasm of mediastinum, part unspecified: Secondary | ICD-10-CM | POA: Diagnosis not present

## 2022-02-28 DIAGNOSIS — D61818 Other pancytopenia: Secondary | ICD-10-CM | POA: Diagnosis not present

## 2022-02-28 DIAGNOSIS — Z7189 Other specified counseling: Secondary | ICD-10-CM | POA: Diagnosis not present

## 2022-02-28 DIAGNOSIS — Z515 Encounter for palliative care: Secondary | ICD-10-CM | POA: Diagnosis not present

## 2022-02-28 LAB — CBC WITH DIFFERENTIAL/PLATELET
Abs Immature Granulocytes: 0.1 10*3/uL — ABNORMAL HIGH (ref 0.00–0.07)
Band Neutrophils: 5 %
Basophils Absolute: 0 10*3/uL (ref 0.0–0.1)
Basophils Relative: 0 %
Eosinophils Absolute: 0 10*3/uL (ref 0.0–0.5)
Eosinophils Relative: 0 %
HCT: 21.5 % — ABNORMAL LOW (ref 39.0–52.0)
Hemoglobin: 7.1 g/dL — ABNORMAL LOW (ref 13.0–17.0)
Lymphocytes Relative: 9 %
Lymphs Abs: 0.2 10*3/uL — ABNORMAL LOW (ref 0.7–4.0)
MCH: 32 pg (ref 26.0–34.0)
MCHC: 33 g/dL (ref 30.0–36.0)
MCV: 96.8 fL (ref 80.0–100.0)
Metamyelocytes Relative: 1 %
Monocytes Absolute: 0.3 10*3/uL (ref 0.1–1.0)
Monocytes Relative: 10 %
Myelocytes: 1 %
Neutro Abs: 2 10*3/uL (ref 1.7–7.7)
Neutrophils Relative %: 74 %
Platelets: 18 10*3/uL — CL (ref 150–400)
RBC: 2.22 MIL/uL — ABNORMAL LOW (ref 4.22–5.81)
RDW: 17 % — ABNORMAL HIGH (ref 11.5–15.5)
WBC: 2.5 10*3/uL — ABNORMAL LOW (ref 4.0–10.5)
nRBC: 4 /100 WBC — ABNORMAL HIGH
nRBC: 6.5 % — ABNORMAL HIGH (ref 0.0–0.2)

## 2022-02-28 LAB — BASIC METABOLIC PANEL
Anion gap: 10 (ref 5–15)
BUN: 25 mg/dL — ABNORMAL HIGH (ref 8–23)
CO2: 32 mmol/L (ref 22–32)
Calcium: 7.8 mg/dL — ABNORMAL LOW (ref 8.9–10.3)
Chloride: 103 mmol/L (ref 98–111)
Creatinine, Ser: 0.72 mg/dL (ref 0.61–1.24)
GFR, Estimated: >60 mL/min (ref >60)
Glucose, Bld: 210 mg/dL — ABNORMAL HIGH (ref 70–99)
Potassium: 3.7 mmol/L (ref 3.5–5.1)
Sodium: 145 mmol/L (ref 135–145)

## 2022-02-28 LAB — BODY FLUID CELL COUNT WITH DIFFERENTIAL
Eos, Fluid: 0 %
Lymphs, Fluid: 22 %
Monocyte-Macrophage-Serous Fluid: 13 % — ABNORMAL LOW (ref 50–90)
Neutrophil Count, Fluid: 65 % — ABNORMAL HIGH (ref 0–25)
Total Nucleated Cell Count, Fluid: 1070 cu mm — ABNORMAL HIGH (ref 0–1000)

## 2022-02-28 LAB — GLUCOSE, CAPILLARY
Glucose-Capillary: 191 mg/dL — ABNORMAL HIGH (ref 70–99)
Glucose-Capillary: 191 mg/dL — ABNORMAL HIGH (ref 70–99)
Glucose-Capillary: 187 mg/dL — ABNORMAL HIGH (ref 70–99)
Glucose-Capillary: 141 mg/dL — ABNORMAL HIGH (ref 70–99)

## 2022-02-28 LAB — BPAM PLATELET PHERESIS
Blood Product Expiration Date: 202401302359
ISSUE DATE / TIME: 202401290823
Unit Type and Rh: 6200

## 2022-02-28 LAB — PROTEIN, PLEURAL OR PERITONEAL FLUID: Total protein, fluid: <3 g/dL

## 2022-02-28 LAB — GLUCOSE, PLEURAL OR PERITONEAL FLUID: Glucose, Fluid: 187 mg/dL

## 2022-02-28 LAB — PREPARE PLATELET PHERESIS: Unit division: 0

## 2022-02-28 LAB — MAGNESIUM: Magnesium: 1.9 mg/dL (ref 1.7–2.4)

## 2022-02-28 LAB — LEGIONELLA PNEUMOPHILA SEROGP 1 UR AG
L. pneumophila Serogp 1 Ur Ag: NEGATIVE
L. pneumophila Serogp 1 Ur Ag: NEGATIVE

## 2022-02-28 LAB — LACTATE DEHYDROGENASE, PLEURAL OR PERITONEAL FLUID: LD, Fluid: 544 U/L — ABNORMAL HIGH (ref 3–23)

## 2022-02-28 LAB — PATHOLOGIST SMEAR REVIEW

## 2022-02-28 LAB — LACTATE DEHYDROGENASE: LDH: 466 U/L — ABNORMAL HIGH (ref 98–192)

## 2022-02-28 MED ORDER — CEFAZOLIN SODIUM-DEXTROSE 2-4 GM/100ML-% IV SOLN
2.0000 g | Freq: Three times a day (TID) | INTRAVENOUS | Status: DC
  Administered 2022-02-28 – 2022-03-01 (×4): 2 g via INTRAVENOUS
  Filled 2022-02-28 (×4): qty 100

## 2022-02-28 NOTE — Progress Notes (Signed)
PROGRESS NOTE    Kelly Olson.  IOX:[id number] DOB: 05-30-1950 DOA: 02/23/2022 PCP: Wenda Low, MD    Brief Narrative:  Kelly Olson. is a pleasant 72 y.o. male with medical history significant for CAD, ischemic cardiomyopathy with recovered EF, recurrent/metastatic small cell lung cancer, chronic pain, and chronic hypoxic respiratory failure who presents emergency department after he was found by home health to have oxygen saturation in the 55s.   Patient was admitted to the hospital 1 month ago with acute hypoxic respiratory failure due to pneumonia and COPD exacerbation.  He was discharged with 2 L/min of supplemental oxygen which he had been using ever since.  He had been feeling more short of breath the last couple days and turned his oxygen up to 3 L/min.  He was seen by home health today who noted his oxygen saturation to be in the 60s despite supplemental oxygen.   In the hospital continued to worsen and was placed in HFNC.  Unable to be managed on the floor he was transferred to the ICU on [facility] service for RN care.     Assessment and Plan: Pneumonia; acute hypoxic respiratory failure  - Blood cultures collected in ED and empiric antibiotics started  -  strep pneumo negative and legionella antigens negative, continue antibiotics, continue supplemental O2, follow cultures and clinical course   -on HFNC-- wean as able -s/p thoracentesis 1/30 with 800 cc removed   Ecoli bacteremia -on IV abx -ID consult for help with abx -port to be removed-- IR consulted  Pancytopenia - On admission, WBC 200 with ANC 140 and ALC 10; Hgb 5.5; platelets 6000  - Likely from chemotherapy, no overt bleeding  - 2 units RBC and 3 units of plts so far - oncology consulted- Adding GCSF daily until ANC > 1.5     Lung cancer  - Currently undergoing treatment with carboplatin and etoposide as well as Cosela and Imfinzi under the care of Dr. Julien Nordmann   -asked palliative care to  follow while hospitalized- now a DNR    Hypokalemia  - Repelte along with Mg    Elevated LFTs  - Appears fairly stable, likely related to liver metastatic disease      Chronic HFpEF  - Appears compensated   - Monitor weights and I/Os   -may need PRN lasix (BNP slightly elevated)   HTN  - Continue Coreg and losartan as tolerated     Chronic pain  - Prescription database reviewed  - does not appear to tolerate any opioids so will d/c   Prolonged QT interval  -  avoid QT-prolonging medications   Overall poor prognosis-- palliative following   DVT prophylaxis: SCDs Start: 02/05/2022 2202    Code Status: DNR (family wishes patient to not know about DNR status rigth now) Family Communication: called wife  Disposition Plan:  Level of care: ICU Status is: Inpatient     Consultants:  Oncology PCCM Palliative care   Subjective: More confused today then prior  Objective: Vitals:   02/28/22 1139 02/28/22 1146 02/28/22 1200 02/28/22 1300  BP:   113/72 106/60  Pulse: (!) 104  (!) 106 (!) 101  Resp: (!) 28  (!) 29 (!) 29  Temp:  98.1 F (36.7 C)    TempSrc:  Oral    SpO2: 92%  (!) 87% 97%  Weight:      Height:        Intake/Output Summary (Last 24 hours) at 02/28/2022 1305 Last data  filed at 02/28/2022 1300 Gross per 24 hour  Intake 837.01 ml  Output 750 ml  Net 87.01 ml   Filed Weights   01/31/2022 1829 02/27/22 0314 02/28/22 0500  Weight: 65.8 kg 68.8 kg 69.1 kg    Examination:    General: Appearance:    Ill appearing male in no acute distress     Lungs:     On HFNC, able to speak in complete sentences, diminished  Heart:    Tachycardic.  MS:   All extremities are intact. [place]  Neurologic:   Awake, confused     Data Reviewed: I have personally reviewed following labs and imaging studies  CBC: Recent Labs  Lab 02/17/2022 2007 02/25/22 0452 02/25/22 1744 02/26/22 0622 02/27/22 0317 02/28/22 0351  WBC 0.2* 0.3* 0.6* 0.6* 1.2* 2.5*   NEUTROABS 0.1* 0.2*  --  0.4* 1.1* 2.0  HGB 5.5* 8.4* 8.5* 8.2* 7.8* 7.1*  HCT 16.2* 23.6* 25.0* 25.0* 23.1* 21.5*  MCV 100.6* 91.1 93.6 95.1 94.7 96.8  PLT 6* 11* 11* 6* 9* 18*   Basic Metabolic Panel: Recent Labs  Lab 02/25/22 0452 02/25/22 1744 02/26/22 0622 02/27/22 0317 02/28/22 0351  NA 145 146* 146* 144 145  K 2.9* 3.4* 3.0* 2.8* 3.7  CL 98 100 102 98 103  CO2 36* 37* 33* 36* 32  GLUCOSE 197* 119* 92 166* 210*  BUN 19 21 21 20  25*  CREATININE 0.66 0.72 0.64 0.64 0.72  CALCIUM 7.3* 7.7* 7.8* 7.8* 7.8*  MG  --  1.6*  --   --  1.9  PHOS  --  1.8*  --   --   --    GFR: Estimated Creatinine Clearance: 82.8 mL/min (by C-G formula based on SCr of 0.72 mg/dL). Liver Function Tests: Recent Labs  Lab 02/15/2022 1936 02/25/22 0452 02/26/22 0622 02/27/22 0317  AST 90* 60* 50* 47*  ALT 119* 100* 87* 83*  ALKPHOS 229* 194* 173* 234*  BILITOT 1.0 1.5* 1.1 0.8  PROT 4.4* 4.0* 4.6* 4.6*  ALBUMIN 1.6* <1.5* <1.5* <1.5*   No results for input(s): "LIPASE", "AMYLASE" in the last 168 hours. No results for input(s): "AMMONIA" in the last 168 hours. Coagulation Profile: Recent Labs  Lab 02/03/2022 1936  INR 1.3*   Cardiac Enzymes: No results for input(s): "CKTOTAL", "CKMB", "CKMBINDEX", "TROPONINI" in the last 168 hours. BNP (last 3 results) No results for input(s): "PROBNP" in the last 8760 hours. HbA1C: No results for input(s): "HGBA1C" in the last 72 hours. CBG: Recent Labs  Lab 02/27/22 1121 02/27/22 1656 02/27/22 2119 02/28/22 0657 02/28/22 1149  GLUCAP 134* 144* 186* 191* 191*   Lipid Profile: No results for input(s): "CHOL", "HDL", "LDLCALC", "TRIG", "CHOLHDL", "LDLDIRECT" in the last 72 hours. Thyroid Function Tests: No results for input(s): "TSH", "T4TOTAL", "FREET4", "T3FREE", "THYROIDAB" in the last 72 hours. Anemia Panel: No results for input(s): "VITAMINB12", "FOLATE", "FERRITIN", "TIBC", "IRON", "RETICCTPCT" in the last 72 hours. Sepsis Labs: Recent  Labs  Lab 02/04/2022 1936 02/18/2022 2029  LATICACIDVEN 1.6 1.3    Recent Results (from the past 240 hour(s))  Blood Culture (routine x 2)     Status: Abnormal   Collection Time: 01/30/2022  7:15 PM   Specimen: BLOOD  Result Value Ref Range Status   Specimen Description BLOOD SITE NOT SPECIFIED  Final   Special Requests   Final    BOTTLES DRAWN AEROBIC AND ANAEROBIC Blood Culture results may not be optimal due to an excessive volume of blood received in  culture bottles   Culture  Setup Time   Final    GRAM NEGATIVE RODS IN BOTH AEROBIC AND ANAEROBIC BOTTLES CRITICAL RESULT CALLED TO, READ BACK BY AND VERIFIED WITH:  C/ PHARMD J. MILLEN 02/25/22 1324 A. LAFRANCE Performed at [facility], [place] [address] [place], [place] [address]    Culture ESCHERICHIA COLI (A)  Final   Report Status 02/27/2022 FINAL  Final   Organism ID, Bacteria ESCHERICHIA COLI  Final      Susceptibility   Escherichia coli - MIC*    AMPICILLIN >=32 RESISTANT Resistant     CEFAZOLIN <=4 SENSITIVE Sensitive     CEFEPIME <=0.12 SENSITIVE Sensitive     CEFTAZIDIME <=1 SENSITIVE Sensitive     CEFTRIAXONE <=0.25 SENSITIVE Sensitive     CIPROFLOXACIN <=0.25 SENSITIVE Sensitive     GENTAMICIN <=1 SENSITIVE Sensitive     IMIPENEM <=0.25 SENSITIVE Sensitive     TRIMETH/SULFA >=320 RESISTANT Resistant     AMPICILLIN/SULBACTAM >=32 RESISTANT Resistant     PIP/TAZO <=4 SENSITIVE Sensitive     * ESCHERICHIA COLI  Blood Culture ID Panel (Reflexed)     Status: Abnormal   Collection Time: 02/20/2022  7:15 PM  Result Value Ref Range Status   Enterococcus faecalis NOT DETECTED NOT DETECTED Final   Enterococcus Faecium NOT DETECTED NOT DETECTED Final   Listeria monocytogenes NOT DETECTED NOT DETECTED Final   Staphylococcus species NOT DETECTED NOT DETECTED Final   Staphylococcus aureus (BCID) NOT DETECTED NOT DETECTED Final   Staphylococcus epidermidis NOT DETECTED NOT DETECTED Final   Staphylococcus lugdunensis NOT  DETECTED NOT DETECTED Final   Streptococcus species NOT DETECTED NOT DETECTED Final   Streptococcus agalactiae NOT DETECTED NOT DETECTED Final   Streptococcus pneumoniae NOT DETECTED NOT DETECTED Final   Streptococcus pyogenes NOT DETECTED NOT DETECTED Final   A.calcoaceticus-baumannii NOT DETECTED NOT DETECTED Final   Bacteroides fragilis NOT DETECTED NOT DETECTED Final   Enterobacterales DETECTED (A) NOT DETECTED Final    Comment: Enterobacterales represent a large order of gram negative bacteria, not a single organism. CRITICAL RESULT CALLED TO, READ BACK BY AND VERIFIED WITH:  C/ PHARMD J. MILLEN 02/25/22 1324 A. LAFRANCE    Enterobacter cloacae complex NOT DETECTED NOT DETECTED Final   Escherichia coli DETECTED (A) NOT DETECTED Final    Comment: CRITICAL RESULT CALLED TO, READ BACK BY AND VERIFIED WITH:  C/ PHARMD J. MILLEN 02/25/22 1324 A. LAFRANCE    Klebsiella aerogenes NOT DETECTED NOT DETECTED Final   Klebsiella oxytoca NOT DETECTED NOT DETECTED Final   Klebsiella pneumoniae NOT DETECTED NOT DETECTED Final   Proteus species NOT DETECTED NOT DETECTED Final   Salmonella species NOT DETECTED NOT DETECTED Final   Serratia marcescens NOT DETECTED NOT DETECTED Final   Haemophilus influenzae NOT DETECTED NOT DETECTED Final   Neisseria meningitidis NOT DETECTED NOT DETECTED Final   Pseudomonas aeruginosa NOT DETECTED NOT DETECTED Final   Stenotrophomonas maltophilia NOT DETECTED NOT DETECTED Final   Candida albicans NOT DETECTED NOT DETECTED Final   Candida auris NOT DETECTED NOT DETECTED Final   Candida glabrata NOT DETECTED NOT DETECTED Final   Candida krusei NOT DETECTED NOT DETECTED Final   Candida parapsilosis NOT DETECTED NOT DETECTED Final   Candida tropicalis NOT DETECTED NOT DETECTED Final   Cryptococcus neoformans/gattii NOT DETECTED NOT DETECTED Final   CTX-M ESBL NOT DETECTED NOT DETECTED Final   Carbapenem resistance IMP NOT DETECTED NOT DETECTED Final   Carbapenem  resistance KPC NOT DETECTED NOT  DETECTED Final   Carbapenem resistance NDM NOT DETECTED NOT DETECTED Final   Carbapenem resist OXA 48 LIKE NOT DETECTED NOT DETECTED Final   Carbapenem resistance VIM NOT DETECTED NOT DETECTED Final    Comment: Performed at [facility], [place] [address] [place], [place] [address]  Blood Culture (routine x 2)     Status: Abnormal   Collection Time: 02/01/2022  7:30 PM   Specimen: BLOOD  Result Value Ref Range Status   Specimen Description BLOOD SITE NOT SPECIFIED  Final   Special Requests   Final    BOTTLES DRAWN AEROBIC AND ANAEROBIC Blood Culture results may not be optimal due to an excessive volume of blood received in culture bottles   Culture  Setup Time   Final    ANAEROBIC BOTTLE ONLY GRAM NEGATIVE RODS CRITICAL VALUE NOTED.  VALUE IS CONSISTENT WITH PREVIOUSLY REPORTED AND CALLED VALUE.    Culture (A)  Final    ESCHERICHIA COLI SUSCEPTIBILITIES PERFORMED ON PREVIOUS CULTURE WITHIN THE LAST 5 DAYS. Performed at [facility], [place] [address], [place], [place] [address]    Report Status 02/27/2022 FINAL  Final  Resp panel by RT-PCR (RSV, Flu A&B, Covid) Anterior Nasal Swab     Status: None   Collection Time: 02/25/22  8:59 AM   Specimen: Anterior Nasal Swab  Result Value Ref Range Status   SARS Coronavirus 2 by RT PCR NEGATIVE NEGATIVE Final    Comment: (NOTE) SARS-CoV-2 target nucleic acids are NOT DETECTED.  The SARS-CoV-2 RNA is generally detectable in upper respiratory specimens during the acute phase of infection. The lowest concentration of SARS-CoV-2 viral copies this assay can detect is 138 copies/mL. A negative result does not preclude SARS-Cov-2 infection and should not be used as the sole basis for treatment or other patient management decisions. A negative result may occur with  improper specimen collection/handling, submission of specimen other than nasopharyngeal swab, presence of viral mutation(s) within the areas  targeted by this assay, and inadequate number of viral copies(<138 copies/mL). A negative result must be combined with clinical observations, patient history, and epidemiological information. The expected result is Negative.  Fact Sheet for Patients:  EntrepreneurPulse.com.au  Fact Sheet for Healthcare Providers:  IncredibleEmployment.be  This test is no t yet approved or cleared by the [place] FDA and  has been authorized for detection and/or diagnosis of SARS-CoV-2 by FDA under an Emergency Use Authorization (EUA). This EUA will remain  in effect (meaning this test can be used) for the duration of the COVID-19 declaration under Section 564(b)(1) of the Act, 21 U.S.C.section 360bbb-3(b)(1), unless the authorization is terminated  or revoked sooner.       Influenza A by PCR NEGATIVE NEGATIVE Final   Influenza B by PCR NEGATIVE NEGATIVE Final    Comment: (NOTE) The Xpert Xpress SARS-CoV-2/FLU/RSV plus assay is intended as an aid in the diagnosis of influenza from Nasopharyngeal swab specimens and should not be used as a sole basis for treatment. Nasal washings and aspirates are unacceptable for Xpert Xpress SARS-CoV-2/FLU/RSV testing.  Fact Sheet for Patients: EntrepreneurPulse.com.au  Fact Sheet for Healthcare Providers: IncredibleEmployment.be  This test is not yet approved or cleared by the [place] FDA and has been authorized for detection and/or diagnosis of SARS-CoV-2 by FDA under an Emergency Use Authorization (EUA). This EUA will remain in effect (meaning this test can be used) for the duration of the COVID-19 declaration under Section 564(b)(1) of the Act, 21 U.S.C. section 360bbb-3(b)(1),  unless the authorization is terminated or revoked.     Resp Syncytial Virus by PCR NEGATIVE NEGATIVE Final    Comment: (NOTE) Fact Sheet for  Patients: EntrepreneurPulse.com.au  Fact Sheet for Healthcare Providers: IncredibleEmployment.be  This test is not yet approved or cleared by the [place] FDA and has been authorized for detection and/or diagnosis of SARS-CoV-2 by FDA under an Emergency Use Authorization (EUA). This EUA will remain in effect (meaning this test can be used) for the duration of the COVID-19 declaration under Section 564(b)(1) of the Act, 21 U.S.C. section 360bbb-3(b)(1), unless the authorization is terminated or revoked.  Performed at [facility], [place] [address], [place], [place] [address]   MRSA Next Gen by PCR, Nasal     Status: None   Collection Time: 02/25/22  8:59 AM  Result Value Ref Range Status   MRSA by PCR Next Gen NOT DETECTED NOT DETECTED Final    Comment: (NOTE) The GeneXpert MRSA Assay (FDA approved for NASAL specimens only), is one component of a comprehensive MRSA colonization surveillance program. It is not intended to diagnose MRSA infection nor to guide or monitor treatment for MRSA infections. Test performance is not FDA approved in patients less than 54 years old. Performed at [facility], [place] [address], [place], [place] [address]   Culture, blood (Routine X 2) w Reflex to ID Panel     Status: None (Preliminary result)   Collection Time: 02/27/22  6:04 PM   Specimen: BLOOD  Result Value Ref Range Status   Specimen Description BLOOD LEFT ANTECUBITAL  Final   Special Requests   Final    BOTTLES DRAWN AEROBIC AND ANAEROBIC Blood Culture adequate volume   Culture   Final    NO GROWTH < 12 HOURS Performed at [facility], [place] [address], [place], [place] [address]    Report Status PENDING  Incomplete  Culture, blood (Routine X 2) w Reflex to ID Panel     Status: None (Preliminary result)   Collection Time: 02/27/22  6:04 PM   Specimen: BLOOD LEFT HAND  Result Value Ref Range Status   Specimen Description BLOOD  LEFT HAND  Final   Special Requests   Final    BOTTLES DRAWN AEROBIC AND ANAEROBIC Blood Culture adequate volume   Culture   Final    NO GROWTH < 12 HOURS Performed at [facility], [place] [address] [place], [place] [address]    Report Status PENDING  Incomplete         Radiology Studies: DG Chest Port 1 View  Result Date: 02/28/2022 CLINICAL DATA:  Status post thoracentesis EXAM: PORTABLE CHEST 1 VIEW COMPARISON:  Chest x-ray 02/27/2022 FINDINGS: Interval reduction in size of the left pleural fluid collection. No postprocedural pneumothorax is identified. There is a persistent small right pleural effusion. Persistent interstitial and airspace process in the lungs but improved aeration when compared to yesterday's film which would suggest resolving pulmonary edema. IMPRESSION: 1. Interval reduction in size of the left pleural fluid collection. No postprocedural pneumothorax. 2. Persistent small right pleural effusion. 3. Improving pulmonary edema. Electronically Signed   By: Marijo Sanes M.D.   On: 02/28/2022 10:07   DG Chest Port 1 View  Result Date: 02/27/2022 CLINICAL DATA:  Hypoxia.  Pneumonia. EXAM: PORTABLE CHEST 1 VIEW COMPARISON:  02/26/2022 FINDINGS: Bibasilar interstitial and patchy airspace disease is similar to prior with persistent bilateral pleural effusions. Cardiopericardial silhouette is at upper limits of normal  for size. Right Port-A-Cath again noted. Bones are diffusely demineralized. IMPRESSION: No substantial interval change. Persistent bibasilar interstitial and patchy airspace disease with bilateral pleural effusions. Electronically Signed   By: Misty Stanley M.D.   On: 02/27/2022 10:32   DG CHEST PORT 1 VIEW  Result Date: 02/26/2022 CLINICAL DATA:  Acute respiratory failure and hypoxia EXAM: PORTABLE CHEST 1 VIEW COMPARISON:  02/25/2022 FINDINGS: Cardiac shadow is stable. Right chest wall port is again seen and stable. Increasing bilateral airspace opacities  are noted particularly in the right apex. Additionally and increasing left-sided effusion is noted. No bony abnormality is noted. IMPRESSION: Increasing infiltrates particularly in the right apex. Increasing left basilar effusion. Electronically Signed   By: Inez Catalina M.D.   On: 02/26/2022 17:40        Scheduled Meds:  Chlorhexidine Gluconate Cloth  6 each Topical Daily   insulin aspart  0-15 Units Subcutaneous TID WC   insulin aspart  0-5 Units Subcutaneous QHS   potassium chloride  20 mEq Oral BID   sodium chloride flush  10-40 mL Intracatheter Q12H   sodium chloride flush  3 mL Intravenous Q12H   sucralfate  1 g Oral QID   Tbo-filgastrim (GRANIX) SQ  480 mcg Subcutaneous q1800   Continuous Infusions:   ceFAZolin (ANCEF) IV     metronidazole Stopped (02/28/22 0651)     LOS: 4 days    Time spent: 45 minutes spent on chart review, discussion with nursing staff, consultants, updating family and interview/physical exam; more than 50% of that time was spent in counseling and/or coordination of care.    Geradine Girt, DO [facility] Available via Epic secure chat 7am-7pm After these hours, please refer to coverage provider listed on [email or web address] 02/28/2022, 1:05 PM

## 2022-02-28 NOTE — TOC Initial Note (Signed)
Transition of Care (TOC) - Initial/Assessment Note    Patient Details  Name: Kelly Olson. MRN: [id number] Date of Birth: January 10, 1951  Transition of Care [facility]) CM/SW Contact:    Bethena Roys, RN Phone Number: 02/28/2022, 3:34 PM  Clinical Narrative: Patient presented after he was found by his home health liaison with oxygen saturations of 60. Patient is active with [employer] for DME oxygen and HH RN Services via [phone]. Palliative Care  and ID are consulting. Case Manager will continue to follow for transition of care needs.                Expected Discharge Plan: [place] Barriers to Discharge: Continued Medical Work up   Patient Goals and CMS Choice     Choice offered to / list presented to : NA      Expected Discharge Plan and Services   Discharge Planning Services: CM Consult Post Acute Care Choice: Home Health, Resumption of Svcs/PTA Provider Living arrangements for the past 2 months: [place]  Prior Living Arrangements/Services Living arrangements for the past 2 months: Single Family Home Lives with:: Spouse Patient language and need for interpreter reviewed:: Yes        Need for Family Participation in Patient Care: Yes (Comment) Care giver support system in place?: Yes (comment) Current home services: DME (Oxygen) Criminal Activity/Legal Involvement Pertinent to Current Situation/Hospitalization: No - Comment as needed  Permission Sought/Granted Permission sought to share information with : Family Supports, Case Freight forwarder, Investment banker, corporate granted to share info w AGENCY: [facility] and [employer]  Alcohol / Substance Use: Not Applicable Psych Involvement: No (comment)  Admission diagnosis:  Hypokalemia [E87.6] Peripheral edema [R60.9] Pneumonia [J18.9] Pancytopenia ([place]) [D61.818] Pneumonia due to infectious organism, unspecified laterality, unspecified part of lung [J18.9] Patient  Active Problem List   Diagnosis Date Noted   Pleural effusion 02/28/2022   E coli bacteremia 02/27/2022   Malignant neoplasm of mediastinum, part unspecified ([place]) 02/25/2022   Pneumonia 02/03/2022   Prolonged QT interval 02/28/2022   Pancytopenia ([place]) 02/27/2022   Type 2 diabetes mellitus with hyperglycemia ([place]) 02/14/2022   Hypoxia 01/26/2022   Acute on chronic respiratory failure with hypoxemia ([place]) 01/26/2022   COPD exacerbation ([place]) 01/26/2022   Hypokalemia 01/26/2022   Elevated troponin 01/26/2022   Elevated LFTs 01/26/2022   Pneumonia of left lower lobe due to infectious organism 01/26/2022   Primary malignant neoplasm of lung with metastasis to brain ([place]) 01/17/2022   Metastasis to bone ([place]) 01/17/2022   Hyponatremia 12/16/2021   Essential hypertension 12/16/2021   Port-A-Cath in place 06/14/2021   Thrombophlebitis 06/14/2021   Primary small cell carcinoma of left lung ([place]) 04/27/2021   Encounter for antineoplastic chemotherapy 04/27/2021   Tobacco user 04/26/2021   Thyroid nodule 04/26/2021   Personal history of other malignant neoplasm of bronchus and lung 04/26/2021   Microscopic hematuria 04/26/2021   Impaired fasting glucose 04/26/2021   History of adenomatous polyp of colon 04/26/2021   ED (erectile dysfunction) of organic origin 04/26/2021   Constant exophthalmos 04/26/2021   Chronic pain 04/26/2021   Adenomatous polyp of colon 04/26/2021   S/P bronchoscopy 04/13/2021   Pneumothorax on left 04/13/2021   Elevated blood pressure reading 07/26/2020   S/P lobectomy of lung 07/29/2018   S/P thoracotomy 07/10/2016   Hx of CABG 06/03/2016   Cardiomyopathy, ischemic-resolved: EF ~35-45% by LV Gram --> 50 and 55% by echo 10/09/2014  Coronary artery disease involving native coronary artery without angina pectoris 06/04/2014   Former heavy tobacco smoker - quit when he had diagnoses of CAD 06/01/2014   History of class III angina pectoris 06/01/2014    Hyperlipidemia with target LDL less than 70 06/01/2014   PCP:  Wenda Low, MD Pharmacy:   [id number] [place], [place] [address] [place] [address] Phone: [phone] Fax: [phone]  [facility] [place], [place] [address] [place] [address] Phone: [phone] Fax: [phone]  [place] ([place], [place] [address] Phone: [phone] Fax: [phone]  [facility] [place], [place] [address] [place] [address] Phone: [phone] Fax: [phone]  [id number] [place], [place]. AT [place] [address] Phone: [phone] Fax: [place] [address] [place] [address] Phone: [phone] Fax: [phone]   Social Determinants of Health (SDOH) Social History: [place]: No Food Insecurity (01/26/2022)  Housing: Low Risk  (01/26/2022)  Transportation Needs: No Transportation Needs (01/26/2022)  Utilities: Not At Risk (01/26/2022)  Tobacco Use: Medium Risk (02/13/2022)   Readmission Risk Interventions     No data to display

## 2022-02-28 NOTE — Progress Notes (Addendum)
Subjective:  No new complaints, encephalopathic  Antibiotics:  Anti-infectives (From admission, onward)    Start     Dose/Rate Route Frequency Ordered Stop   02/28/22 1400  ceFAZolin (ANCEF) IVPB 2g/100 mL premix        2 g 200 mL/hr over 30 Minutes Intravenous Every 8 hours 02/28/22 1111     02/27/22 1800  ceFEPIme (MAXIPIME) 2 g in sodium chloride 0.9 % 100 mL IVPB  Status:  Discontinued        2 g 200 mL/hr over 30 Minutes Intravenous Every 8 hours 02/27/22 1140 02/28/22 1111   02/27/22 1800  metroNIDAZOLE (FLAGYL) IVPB 500 mg        500 mg 100 mL/hr over 60 Minutes Intravenous Every 12 hours 02/27/22 1140     02/26/22 1730  ceFEPIme (MAXIPIME) 1 g in sodium chloride 0.9 % 100 mL IVPB  Status:  Discontinued        1 g 200 mL/hr over 30 Minutes Intravenous Every 8 hours 02/26/22 1634 02/27/22 1140   02/25/22 1000  vancomycin (VANCOREADY) IVPB 750 mg/150 mL  Status:  Discontinued        750 mg 150 mL/hr over 60 Minutes Intravenous Every 12 hours 02/23/2022 2206 02/25/22 1333   02/12/2022 2230  cefTRIAXone (ROCEPHIN) 2 g in sodium chloride 0.9 % 100 mL IVPB  Status:  Discontinued        2 g 200 mL/hr over 30 Minutes Intravenous Every 24 hours 02/23/2022 2204 02/26/22 1620   02/07/2022 2230  doxycycline (VIBRAMYCIN) 100 mg in sodium chloride 0.9 % 250 mL IVPB  Status:  Discontinued        100 mg 125 mL/hr over 120 Minutes Intravenous Every 12 hours 02/02/2022 2204 02/27/22 1140   02/19/2022 2200  ceFEPIme (MAXIPIME) 2 g in sodium chloride 0.9 % 100 mL IVPB  Status:  Discontinued        2 g 200 mL/hr over 30 Minutes Intravenous  Once 02/22/2022 2114 02/27/2022 2212   02/19/2022 2145  vancomycin (VANCOREADY) IVPB 1500 mg/300 mL        1,500 mg 150 mL/hr over 120 Minutes Intravenous  Once 02/04/2022 2138 02/25/22 0129       Medications: Scheduled Meds:  Chlorhexidine Gluconate Cloth  6 each Topical Daily   insulin aspart  0-15 Units Subcutaneous TID WC   insulin aspart  0-5 Units  Subcutaneous QHS   potassium chloride  20 mEq Oral BID   sodium chloride flush  10-40 mL Intracatheter Q12H   sodium chloride flush  3 mL Intravenous Q12H   sucralfate  1 g Oral QID   Tbo-filgastrim (GRANIX) SQ  480 mcg Subcutaneous q1800   Continuous Infusions:   ceFAZolin (ANCEF) IV 2 g (02/28/22 1406)   metronidazole Stopped (02/28/22 0651)   PRN Meds:.acetaminophen **OR** acetaminophen, albuterol, bisacodyl, lidocaine-prilocaine, naLOXone (NARCAN)  injection, polyethylene glycol, sodium chloride flush    Objective: Weight change: 0.3 kg  Intake/Output Summary (Last 24 hours) at 02/28/2022 1525 Last data filed at 02/28/2022 1300 Gross per 24 hour  Intake 837.01 ml  Output 750 ml  Net 87.01 ml   Blood pressure 106/60, pulse (!) 104, temperature 98.1 F (36.7 C), temperature source Oral, resp. rate (!) 25, height 6' (1.829 m), weight 69.1 kg, SpO2 97 %. Temp:  [98.1 F (36.7 C)-98.8 F (37.1 C)] 98.1 F (36.7 C) (01/30 1146) Pulse Rate:  [80-114] 104 (01/30 1418) Resp:  [16-35] 25 (01/30 1418) BP: (  84-142)/(51-86) 106/60 (01/30 1300) SpO2:  [87 %-99 %] 97 % (01/30 1418) FiO2 (%):  [50 %-100 %] 58 % (01/30 1418) Weight:  [69.1 kg] 69.1 kg (01/30 0500)  Physical Exam: Physical Exam Constitutional:      Appearance: He is ill-appearing.  HENT:     Head: Normocephalic and atraumatic.  Cardiovascular:     Rate and Rhythm: Tachycardia present.     Heart sounds: No murmur heard.    No friction rub. No gallop.  Pulmonary:     Effort: Pulmonary effort is normal.     Breath sounds: Stridor present.  Abdominal:     General: Abdomen is flat.  Musculoskeletal:        General: Normal range of motion.  Skin:    General: Skin is warm.     Findings: Bruising present.  Neurological:     General: No focal deficit present.     Mental Status: He is alert.  Psychiatric:        Attention and Perception: Attention normal.        Behavior: Behavior is cooperative.         Cognition and Memory: Cognition is impaired. Memory is impaired. He exhibits impaired recent memory and impaired remote memory.      CBC:    BMET Recent Labs    02/27/22 0317 02/28/22 0351  NA 144 145  K 2.8* 3.7  CL 98 103  CO2 36* 32  GLUCOSE 166* 210*  BUN 20 25*  CREATININE 0.64 0.72  CALCIUM 7.8* 7.8*     Liver Panel  Recent Labs    02/26/22 0622 02/27/22 0317  PROT 4.6* 4.6*  ALBUMIN <1.5* <1.5*  AST 50* 47*  ALT 87* 83*  ALKPHOS 173* 234*  BILITOT 1.1 0.8       Sedimentation Rate No results for input(s): "ESRSEDRATE" in the last 72 hours. C-Reactive Protein No results for input(s): "CRP" in the last 72 hours.  Micro Results: Recent Results (from the past 720 hour(s))  Blood Culture (routine x 2)     Status: Abnormal   Collection Time: 02/09/2022  7:15 PM   Specimen: BLOOD  Result Value Ref Range Status   Specimen Description BLOOD SITE NOT SPECIFIED  Final   Special Requests   Final    BOTTLES DRAWN AEROBIC AND ANAEROBIC Blood Culture results may not be optimal due to an excessive volume of blood received in culture bottles   Culture  Setup Time   Final    GRAM NEGATIVE RODS IN BOTH AEROBIC AND ANAEROBIC BOTTLES CRITICAL RESULT CALLED TO, READ BACK BY AND VERIFIED WITH:  C/ PHARMD J. MILLEN 02/25/22 1324 A. LAFRANCE Performed at [facility], [place] [address] [place], [place] [address]    Culture ESCHERICHIA COLI (A)  Final   Report Status 02/27/2022 FINAL  Final   Organism ID, Bacteria ESCHERICHIA COLI  Final      Susceptibility   Escherichia coli - MIC*    AMPICILLIN >=32 RESISTANT Resistant     CEFAZOLIN <=4 SENSITIVE Sensitive     CEFEPIME <=0.12 SENSITIVE Sensitive     CEFTAZIDIME <=1 SENSITIVE Sensitive     CEFTRIAXONE <=0.25 SENSITIVE Sensitive     CIPROFLOXACIN <=0.25 SENSITIVE Sensitive     GENTAMICIN <=1 SENSITIVE Sensitive     IMIPENEM <=0.25 SENSITIVE Sensitive     TRIMETH/SULFA >=320 RESISTANT Resistant      AMPICILLIN/SULBACTAM >=32 RESISTANT Resistant     PIP/TAZO <=4 SENSITIVE Sensitive     *  ESCHERICHIA COLI  Blood Culture ID Panel (Reflexed)     Status: Abnormal   Collection Time: 02/13/2022  7:15 PM  Result Value Ref Range Status   Enterococcus faecalis NOT DETECTED NOT DETECTED Final   Enterococcus Faecium NOT DETECTED NOT DETECTED Final   Listeria monocytogenes NOT DETECTED NOT DETECTED Final   Staphylococcus species NOT DETECTED NOT DETECTED Final   Staphylococcus aureus (BCID) NOT DETECTED NOT DETECTED Final   Staphylococcus epidermidis NOT DETECTED NOT DETECTED Final   Staphylococcus lugdunensis NOT DETECTED NOT DETECTED Final   Streptococcus species NOT DETECTED NOT DETECTED Final   Streptococcus agalactiae NOT DETECTED NOT DETECTED Final   Streptococcus pneumoniae NOT DETECTED NOT DETECTED Final   Streptococcus pyogenes NOT DETECTED NOT DETECTED Final   A.calcoaceticus-baumannii NOT DETECTED NOT DETECTED Final   Bacteroides fragilis NOT DETECTED NOT DETECTED Final   Enterobacterales DETECTED (A) NOT DETECTED Final    Comment: Enterobacterales represent a large order of gram negative bacteria, not a single organism. CRITICAL RESULT CALLED TO, READ BACK BY AND VERIFIED WITH:  C/ PHARMD J. MILLEN 02/25/22 1324 A. LAFRANCE    Enterobacter cloacae complex NOT DETECTED NOT DETECTED Final   Escherichia coli DETECTED (A) NOT DETECTED Final    Comment: CRITICAL RESULT CALLED TO, READ BACK BY AND VERIFIED WITH:  C/ PHARMD J. MILLEN 02/25/22 1324 A. LAFRANCE    Klebsiella aerogenes NOT DETECTED NOT DETECTED Final   Klebsiella oxytoca NOT DETECTED NOT DETECTED Final   Klebsiella pneumoniae NOT DETECTED NOT DETECTED Final   Proteus species NOT DETECTED NOT DETECTED Final   Salmonella species NOT DETECTED NOT DETECTED Final   Serratia marcescens NOT DETECTED NOT DETECTED Final   Haemophilus influenzae NOT DETECTED NOT DETECTED Final   Neisseria meningitidis NOT DETECTED NOT DETECTED  Final   Pseudomonas aeruginosa NOT DETECTED NOT DETECTED Final   Stenotrophomonas maltophilia NOT DETECTED NOT DETECTED Final   Candida albicans NOT DETECTED NOT DETECTED Final   Candida auris NOT DETECTED NOT DETECTED Final   Candida glabrata NOT DETECTED NOT DETECTED Final   Candida krusei NOT DETECTED NOT DETECTED Final   Candida parapsilosis NOT DETECTED NOT DETECTED Final   Candida tropicalis NOT DETECTED NOT DETECTED Final   Cryptococcus neoformans/gattii NOT DETECTED NOT DETECTED Final   CTX-M ESBL NOT DETECTED NOT DETECTED Final   Carbapenem resistance IMP NOT DETECTED NOT DETECTED Final   Carbapenem resistance KPC NOT DETECTED NOT DETECTED Final   Carbapenem resistance NDM NOT DETECTED NOT DETECTED Final   Carbapenem resist OXA 48 LIKE NOT DETECTED NOT DETECTED Final   Carbapenem resistance VIM NOT DETECTED NOT DETECTED Final    Comment: Performed at [facility], [address], [place], [place] [address]  Blood Culture (routine x 2)     Status: Abnormal   Collection Time: 02/02/2022  7:30 PM   Specimen: BLOOD  Result Value Ref Range Status   Specimen Description BLOOD SITE NOT SPECIFIED  Final   Special Requests   Final    BOTTLES DRAWN AEROBIC AND ANAEROBIC Blood Culture results may not be optimal due to an excessive volume of blood received in culture bottles   Culture  Setup Time   Final    ANAEROBIC BOTTLE ONLY GRAM NEGATIVE RODS CRITICAL VALUE NOTED.  VALUE IS CONSISTENT WITH PREVIOUSLY REPORTED AND CALLED VALUE.    Culture (A)  Final    ESCHERICHIA COLI SUSCEPTIBILITIES PERFORMED ON PREVIOUS CULTURE WITHIN THE LAST 5 DAYS. Performed at [facility], [place]  [address], [place], [place] [address]    Report Status 02/27/2022 FINAL  Final  Resp panel by RT-PCR (RSV, Flu A&B, Covid) Anterior Nasal Swab     Status: None   Collection Time: 02/25/22  8:59 AM   Specimen: Anterior Nasal Swab  Result Value Ref Range Status   SARS Coronavirus 2 by RT PCR NEGATIVE  NEGATIVE Final    Comment: (NOTE) SARS-CoV-2 target nucleic acids are NOT DETECTED.  The SARS-CoV-2 RNA is generally detectable in upper respiratory specimens during the acute phase of infection. The lowest concentration of SARS-CoV-2 viral copies this assay can detect is 138 copies/mL. A negative result does not preclude SARS-Cov-2 infection and should not be used as the sole basis for treatment or other patient management decisions. A negative result may occur with  improper specimen collection/handling, submission of specimen other than nasopharyngeal swab, presence of viral mutation(s) within the areas targeted by this assay, and inadequate number of viral copies(<138 copies/mL). A negative result must be combined with clinical observations, patient history, and epidemiological information. The expected result is Negative.  Fact Sheet for Patients:  EntrepreneurPulse.com.au  Fact Sheet for Healthcare Providers:  IncredibleEmployment.be  This test is no t yet approved or cleared by the [place] FDA and  has been authorized for detection and/or diagnosis of SARS-CoV-2 by FDA under an Emergency Use Authorization (EUA). This EUA will remain  in effect (meaning this test can be used) for the duration of the COVID-19 declaration under Section 564(b)(1) of the Act, 21 U.S.C.section 360bbb-3(b)(1), unless the authorization is terminated  or revoked sooner.       Influenza A by PCR NEGATIVE NEGATIVE Final   Influenza B by PCR NEGATIVE NEGATIVE Final    Comment: (NOTE) The Xpert Xpress SARS-CoV-2/FLU/RSV plus assay is intended as an aid in the diagnosis of influenza from Nasopharyngeal swab specimens and should not be used as a sole basis for treatment. Nasal washings and aspirates are unacceptable for Xpert Xpress SARS-CoV-2/FLU/RSV testing.  Fact Sheet for Patients: EntrepreneurPulse.com.au  Fact Sheet for Healthcare  Providers: IncredibleEmployment.be  This test is not yet approved or cleared by the [place] FDA and has been authorized for detection and/or diagnosis of SARS-CoV-2 by FDA under an Emergency Use Authorization (EUA). This EUA will remain in effect (meaning this test can be used) for the duration of the COVID-19 declaration under Section 564(b)(1) of the Act, 21 U.S.C. section 360bbb-3(b)(1), unless the authorization is terminated or revoked.     Resp Syncytial Virus by PCR NEGATIVE NEGATIVE Final    Comment: (NOTE) Fact Sheet for Patients: EntrepreneurPulse.com.au  Fact Sheet for Healthcare Providers: IncredibleEmployment.be  This test is not yet approved or cleared by the [place] FDA and has been authorized for detection and/or diagnosis of SARS-CoV-2 by FDA under an Emergency Use Authorization (EUA). This EUA will remain in effect (meaning this test can be used) for the duration of the COVID-19 declaration under Section 564(b)(1) of the Act, 21 U.S.C. section 360bbb-3(b)(1), unless the authorization is terminated or revoked.  Performed at [facility], [place] [address], [place], [place] [address]   MRSA Next Gen by PCR, Nasal     Status: None   Collection Time: 02/25/22  8:59 AM  Result Value Ref Range Status   MRSA by PCR Next Gen NOT DETECTED NOT DETECTED Final    Comment: (NOTE) The GeneXpert MRSA Assay (FDA approved for NASAL specimens only), is one component of a comprehensive MRSA colonization surveillance program. It is  not intended to diagnose MRSA infection nor to guide or monitor treatment for MRSA infections. Test performance is not FDA approved in patients less than 65 years old. Performed at [facility], [place] [address] [place], [place] [address]   Culture, blood (Routine X 2) w Reflex to ID Panel     Status: None (Preliminary result)   Collection Time: 02/27/22  6:04 PM   Specimen:  BLOOD  Result Value Ref Range Status   Specimen Description BLOOD LEFT ANTECUBITAL  Final   Special Requests   Final    BOTTLES DRAWN AEROBIC AND ANAEROBIC Blood Culture adequate volume   Culture   Final    NO GROWTH < 12 HOURS Performed at [facility], [place] [address], [place], [place] [address]    Report Status PENDING  Incomplete  Culture, blood (Routine X 2) w Reflex to ID Panel     Status: None (Preliminary result)   Collection Time: 02/27/22  6:04 PM   Specimen: BLOOD LEFT HAND  Result Value Ref Range Status   Specimen Description BLOOD LEFT HAND  Final   Special Requests   Final    BOTTLES DRAWN AEROBIC AND ANAEROBIC Blood Culture adequate volume   Culture   Final    NO GROWTH < 12 HOURS Performed at [facility], [place] [address], [place], [place] [address]    Report Status PENDING  Incomplete  Body fluid culture w Gram Stain     Status: None (Preliminary result)   Collection Time: 02/28/22  9:45 AM   Specimen: Pleural Fluid  Result Value Ref Range Status   Specimen Description PLEURAL  Final   Special Requests NONE  Final   Gram Stain   Final    FEW WBC PRESENT,BOTH PMN AND MONONUCLEAR NO ORGANISMS SEEN Performed at [facility], [address] [place], [place] [address]    Culture PENDING  Incomplete   Report Status PENDING  Incomplete    Studies/Results: DG Chest Port 1 View  Result Date: 02/28/2022 CLINICAL DATA:  Status post thoracentesis EXAM: PORTABLE CHEST 1 VIEW COMPARISON:  Chest x-ray 02/27/2022 FINDINGS: Interval reduction in size of the left pleural fluid collection. No postprocedural pneumothorax is identified. There is a persistent small right pleural effusion. Persistent interstitial and airspace process in the lungs but improved aeration when compared to yesterday's film which would suggest resolving pulmonary edema. IMPRESSION: 1. Interval reduction in size of the left pleural fluid collection. No postprocedural pneumothorax. 2.  Persistent small right pleural effusion. 3. Improving pulmonary edema. Electronically Signed   By: Marijo Sanes M.D.   On: 02/28/2022 10:07   DG Chest Port 1 View  Result Date: 02/27/2022 CLINICAL DATA:  Hypoxia.  Pneumonia. EXAM: PORTABLE CHEST 1 VIEW COMPARISON:  02/26/2022 FINDINGS: Bibasilar interstitial and patchy airspace disease is similar to prior with persistent bilateral pleural effusions. Cardiopericardial silhouette is at upper limits of normal for size. Right Port-A-Cath again noted. Bones are diffusely demineralized. IMPRESSION: No substantial interval change. Persistent bibasilar interstitial and patchy airspace disease with bilateral pleural effusions. Electronically Signed   By: Misty Stanley M.D.   On: 02/27/2022 10:32   DG CHEST PORT 1 VIEW  Result Date: 02/26/2022 CLINICAL DATA:  Acute respiratory failure and hypoxia EXAM: PORTABLE CHEST 1 VIEW COMPARISON:  02/25/2022 FINDINGS: Cardiac shadow is stable. Right chest wall port is again seen and stable. Increasing bilateral airspace opacities are noted particularly in the right apex. Additionally and increasing left-sided effusion  is noted. No bony abnormality is noted. IMPRESSION: Increasing infiltrates particularly in the right apex. Increasing left basilar effusion. Electronically Signed   By: Inez Catalina M.D.   On: 02/26/2022 17:40      Assessment/Plan:  INTERVAL HISTORY:   removal of port discussed with Dr. Earlie Server  Patient is sp thoracentesis    Principal Problem:   E coli bacteremia Active Problems:   Coronary artery disease involving native coronary artery without angina pectoris   Chronic pain   Primary malignant neoplasm of lung with metastasis to brain ([place])   Acute on chronic respiratory failure with hypoxemia (H[facility])   Hypokalemia   Elevated LFTs   Pneumonia   Prolonged QT interval   Pancytopenia (HCC)   Type 2 diabetes mellitus with hyperglycemia ([facility])   Malignant neoplasm of mediastinum, part  unspecified ([place])   Pleural effusion    Venetia Night [place]. is a 72 y.o. male with metastatic lung cancer admitted with sepsis and E. coli bacteremia with concern for Port-A-Cath infection  #1 E coli bacteremia:  Will narrow to cefazolin  I Have discussed with Dr. Earlie Server and he is agree-able to port removal    The patient did not seem to completely understand what I was talking about when I talked him about port removal but his wife did and she was agreeable to it being removed.  Consulted IR to remove port  Would get repeat blood cultures after port removal  #2 ? Pna: Will distinguish pulmonary infections from his metastatic lung cancer:  We are continuing metronidazole for now for anaerobic coverage will give him a limited course.  #3 Pleural effusion: LDH at 544, protein < 3. Will add serum LDH. May be exudate based on LDH, will followup cultures  I spent 52 minutes with the patient including than 50%  in face to face and non face to face time with the patient  along with review of medical records in preparation for the visit and during the visit and in coordination of his care.    LOS: 4 days   Alcide Evener 02/28/2022, 3:25 PM

## 2022-02-28 NOTE — Progress Notes (Signed)
SLP Cancellation Note  Patient Details Name: Kelly Olson. MRN: [id number] DOB: 1950-09-23   Cancelled treatment:       Reason Eval/Treat Not Completed: Other (comment) (Patient currently NPO with high oxygen requirement. SLP will s/o at this time but please reorder if needed.)   Sonia Baller, MA, CCC-SLP Speech Therapy

## 2022-02-28 NOTE — Procedures (Signed)
Thoracentesis  Procedure Note  Ade Stmarie  [id number]  September 28, 1950  Date:02/28/22  Time:9:27 AM   Provider Performing:Timathy Newberry Shearon Stalls   Procedure: Thoracentesis with imaging guidance (22979)  Indication(s) Pleural Effusion  Consent Risks of the procedure as well as the alternatives and risks of each were explained to the patient and/or caregiver.  Consent for the procedure was obtained and is signed in the bedside chart  Anesthesia Topical only with 1% lidocaine    Time Out Verified patient identification, verified procedure, site/side was marked, verified correct patient position, special equipment/implants available, medications/allergies/relevant history reviewed, required imaging and test results available.   Sterile Technique Maximal sterile technique including full sterile barrier drape, hand hygiene, sterile gown, sterile gloves, mask, hair covering, sterile ultrasound probe cover (if used).  Procedure Description Ultrasound was used to identify appropriate pleural anatomy for placement and overlying skin marked.  Area of drainage cleaned and draped in sterile fashion. Lidocaine was used to anesthetize the skin and subcutaneous tissue. 800 cc's of amber appearing fluid was drained from the left pleural space. Catheter then removed and bandaid applied to site.   Complications/Tolerance None; patient tolerated the procedure well. Chest X-ray is ordered to confirm no post-procedural complication.   EBL Minimal   Specimen(s) Pleural fluid   Montey Hora, PA - [facility] For pager details, please see AMION or use Epic chat  After 1900, please call [place] for cross coverage needs 02/28/2022, 9:29 AM

## 2022-02-28 NOTE — Progress Notes (Signed)
NAME:  Zaydon Kinser., MRN:  [id number], DOB:  1950-08-22, LOS: 4 ADMISSION DATE:  02/15/2022, CONSULTATION DATE:  02/25/22 REFERRING MD:  Dr. 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  Denied recent fever, chills, N/V.  No change in his chronic bilateral LE edema, bruising more but no bleeding.  Admitted one month ago with PNA and AECOPD discharged on new home O2 2L.    On evaluation, found with SIRS, CXR c/w emphysema and L> R airspace disease with K 2.3, bicarb 38, elevated LFTs, BNP 689, Hgb 5.5, plts 6, WBC 0.3.  Oncology following.  1/27 with worsening respiratory distress, increasing O2 needs, and decreasing mental status, PCCM consulted further recs.  Patient complains of mild SOB, denies N/V or abd pain, mild neck discomfort.   Pertinent  Medical History   Past Medical History:  Diagnosis Date   Coronary artery disease, occlusive 06/02/2014   Multivessel CAD.mLAD-100%, mRCA 99%, dRCA 100%, mLCX 90%  And EF 35-45%. -->  Referred for CABG; nonischemic Myoview March 2020   Former heavy tobacco smoker    Quit in April 2016    GERD (gastroesophageal reflux disease)    Hyperlipidemia with target LDL less than 70    Hypertension    Ischemic cardiomyopathy - resolved 05/2014   Myoview: EF ~33% with "infarction vs. severe resting ischemia in LAD & RCA territory; b) EF by Cath: 35-45%. 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   Stage T2 a, N0   Significant Hospital Events: Including procedures, antibiotic start and stop dates in addition to other pertinent events   1/26 admitted TRH sepsis, PNA, pancytopenia, neutropenic fever 1/27 started on heated high flow for worsening shortness of breath.  Blood cultures positive for E. coli. 1/29 pt took oxygen off. Desaturated down to 70s. Cyanotic. Got some narcan too. Rapid response called. Requiring sxn to assist w/ cough Moved to ICU   Interim History / Subjective:  Remains on HHFNC 60L at 100%. Able to speak in full sentences. DNR/DNI confirmed yesterday.  Objective   Blood pressure (!) 142/77, pulse 98, temperature 98.7 F (37.1 C), temperature source Oral, resp. rate (!) 22, height 6' (1.829 m), weight 69.1 kg, SpO2 95 %.    FiO2 (%):  [50 %-62 %] 53 %   Intake/Output Summary (Last 24 hours) at 02/28/2022 0730 Last data filed at 02/28/2022 0700 Gross per 24 hour  Intake 1017.01 ml  Output 750 ml  Net 267.01 ml    Filed Weights   02/09/2022 1829 02/27/22 0314 02/28/22 0500  Weight: 65.8 kg 68.8 kg 69.1 kg   Examination: General: Adult male, chronically ill appearing, resting in bed, in NAD. Neuro: A&O x 3, no deficits. HEENT: [place]/AT. Sclerae anicteric. EOMI. [place] in place. Cardiovascular: RRR, no M/R/G.  Lungs: Respirations even and unlabored.  CTA  bilaterally, No W/R/R. Abdomen: BS x 4, soft, NT/ND.  Musculoskeletal: No gross deformities, no edema.  Skin: Intact, warm, no rashes.  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 Julien Nordmann, on carboplatin, etoposide, cosela, imfinzi s/p XRT brain) Plan Continuing heated high flow, wean as able for saturations greater than 90% Will plan for left sided thora today for fluid analysis and hopefully provide some symptomatic relief too Abx as per below   Aspiration precautions  Diuresis per primary team Cont BDs, HT saline nebs Oncology following DNR/DNI  Sepsis secondary to PNA and E. Coli bacteremia  Plan Day 5 abx Cefepime and Flagyl per ID (changed from CTX and doxy) Ultimately needs port removed ID following  Acquired pancytopenia 2/2 chemo Anemia Plan Transfuse for Hgb < 7 goal plts >10k->getting platelets today  GCSF till [place] > 1.5   Protein calorie malnutrition Plan recs per RD    Rest per primary team.  Best Practice (right click and "Reselect all SmartList Selections" daily)   Diet/type: NPO w/ oral meds DVT prophylaxis: SCD GI prophylaxis: PPI Lines: N/A Foley:  N/A Code Status:  full code Last date of multidisciplinary goals of care discussion [1/26]   Montey Hora, [place] For pager details, please see AMION or use Epic chat  After 1900, please call [place] for cross coverage needs 02/28/2022, 8:22 AM

## 2022-02-28 NOTE — Progress Notes (Addendum)
Palliative:  HPI: 72 y.o. male  with past medical history of small cell lung cancer with mets to bone and brain, COPD, chronic 2L oxygen, former smoker, CAD, ICM with recovered EF, diabetes, chronic pain admitted on 01/30/2022 with shortness of breath and hypoxia with pneumonia, possible aspiration, E. Coli bacteremia, pancytopenia, thrombocytopenia, neutropenic fever, elevated LFTs.   I met today with Kelly Olson and he is alert and interactive but very confused. He talks quickly and gets somewhat agitated in conversation but he is able to be easily redirected and reassured. I met with wife Kelly Olson and her sister, Kelly Olson, as they are coming to visit. We reviewed his status with ongoing severe respiratory failure - he has not been able to wean oxygen at all. I am still concerned about his significant congestion and weak cough - unable to expectorate. Ongoing monitoring to see if he has any improvement after thoracentesis. They understand that his status is tenuous and overall prognosis is poor. We discussed the plan for ongoing conversation to see if he is showing any signs of improvement. Time for outcomes.   All questions/concerns addressed. Emotional support provided.   Exam: Alert, confused. Breathing labored with conversation. HR ST. Congested cough - unable to move secretions. Abd flat. Moves all extremities. Generalized weakness and fatigue.     Plan: - DNR decided (see details from my note on 1/29). - Time for outcomes.  - Consideration of comfort care with any worsening or without progress/improvement.   [place], NP Palliative Medicine Team Pager [phone] (Please see [email or web address] for schedule) Team Phone [phone]    Greater than 50%  of this time was spent counseling and coordinating care related to the above assessment and plan

## 2022-02-28 NOTE — Telephone Encounter (Signed)
[facility] enrolled pt in their services nursing and PT. I told Magda Paganini that Dr. Julien Nordmann will sign orders. Pt admitted to hospital on 01/26.

## 2022-03-01 ENCOUNTER — Inpatient Hospital Stay (HOSPITAL_COMMUNITY): Payer: Medicare Other

## 2022-03-01 DIAGNOSIS — T80211A Bloodstream infection due to central venous catheter, initial encounter: Secondary | ICD-10-CM

## 2022-03-01 DIAGNOSIS — E1165 Type 2 diabetes mellitus with hyperglycemia: Secondary | ICD-10-CM | POA: Diagnosis not present

## 2022-03-01 DIAGNOSIS — T80211D Bloodstream infection due to central venous catheter, subsequent encounter: Secondary | ICD-10-CM

## 2022-03-01 DIAGNOSIS — C383 Malignant neoplasm of mediastinum, part unspecified: Secondary | ICD-10-CM | POA: Diagnosis not present

## 2022-03-01 DIAGNOSIS — D61818 Other pancytopenia: Secondary | ICD-10-CM | POA: Diagnosis not present

## 2022-03-01 DIAGNOSIS — R7881 Bacteremia: Secondary | ICD-10-CM | POA: Diagnosis not present

## 2022-03-01 DIAGNOSIS — B9620 Unspecified Escherichia coli [E. coli] as the cause of diseases classified elsewhere: Secondary | ICD-10-CM | POA: Diagnosis not present

## 2022-03-01 LAB — CBC WITH DIFFERENTIAL/PLATELET
Abs Immature Granulocytes: 0.33 10*3/uL — ABNORMAL HIGH (ref 0.00–0.07)
Basophils Absolute: 0.1 10*3/uL (ref 0.0–0.1)
Basophils Relative: 2 %
Eosinophils Absolute: 0 10*3/uL (ref 0.0–0.5)
Eosinophils Relative: 0 %
HCT: 22.7 % — ABNORMAL LOW (ref 39.0–52.0)
Hemoglobin: 7.3 g/dL — ABNORMAL LOW (ref 13.0–17.0)
Immature Granulocytes: 7 %
Lymphocytes Relative: 5 %
Lymphs Abs: 0.2 10*3/uL — ABNORMAL LOW (ref 0.7–4.0)
MCH: 31.2 pg (ref 26.0–34.0)
MCHC: 32.2 g/dL (ref 30.0–36.0)
MCV: 97 fL (ref 80.0–100.0)
Monocytes Absolute: 0.5 10*3/uL (ref 0.1–1.0)
Monocytes Relative: 10 %
Neutro Abs: 3.7 10*3/uL (ref 1.7–7.7)
Neutrophils Relative %: 76 %
Platelets: 13 10*3/uL — CL (ref 150–400)
RBC: 2.34 MIL/uL — ABNORMAL LOW (ref 4.22–5.81)
RDW: 16.8 % — ABNORMAL HIGH (ref 11.5–15.5)
Smear Review: DECREASED
WBC: 4.9 10*3/uL (ref 4.0–10.5)
nRBC: 6.2 % — ABNORMAL HIGH (ref 0.0–0.2)

## 2022-03-01 LAB — GLUCOSE, CAPILLARY
Glucose-Capillary: 157 mg/dL — ABNORMAL HIGH (ref 70–99)
Glucose-Capillary: 125 mg/dL — ABNORMAL HIGH (ref 70–99)
Glucose-Capillary: 123 mg/dL — ABNORMAL HIGH (ref 70–99)

## 2022-03-01 LAB — BASIC METABOLIC PANEL
Anion gap: 12 (ref 5–15)
BUN: 26 mg/dL — ABNORMAL HIGH (ref 8–23)
CO2: 32 mmol/L (ref 22–32)
Calcium: 7.8 mg/dL — ABNORMAL LOW (ref 8.9–10.3)
Chloride: 102 mmol/L (ref 98–111)
Creatinine, Ser: 0.74 mg/dL (ref 0.61–1.24)
GFR, Estimated: >60 mL/min (ref >60)
Glucose, Bld: 167 mg/dL — ABNORMAL HIGH (ref 70–99)
Potassium: 3.2 mmol/L — ABNORMAL LOW (ref 3.5–5.1)
Sodium: 146 mmol/L — ABNORMAL HIGH (ref 135–145)

## 2022-03-01 LAB — TRIGLYCERIDES, BODY FLUIDS: Triglycerides, Fluid: 14 mg/dL

## 2022-03-01 MED ORDER — LORAZEPAM 2 MG/ML IJ SOLN
1.0000 mg | INTRAMUSCULAR | Status: DC | PRN
  Administered 2022-03-01: 1 mg via INTRAVENOUS
  Filled 2022-03-01 (×2): qty 1

## 2022-03-01 MED ORDER — POLYVINYL ALCOHOL 1.4 % OP SOLN: 1.0000 [drp] | Freq: Four times a day (QID) | OPHTHALMIC | Status: DC | PRN

## 2022-03-01 MED ORDER — GLYCOPYRROLATE 0.2 MG/ML IJ SOLN
0.2000 mg | INTRAMUSCULAR | Status: DC | PRN
  Administered 2022-03-01: 0.2 mg via INTRAVENOUS
  Filled 2022-03-01: qty 1

## 2022-03-01 MED ORDER — GLYCOPYRROLATE 0.2 MG/ML IJ SOLN: 0.2000 mg | INTRAMUSCULAR | Status: DC | PRN

## 2022-03-01 MED ORDER — SODIUM CHLORIDE 0.9 % IV SOLN: INTRAVENOUS | Status: DC

## 2022-03-01 MED ORDER — POTASSIUM CHLORIDE 10 MEQ/50ML IV SOLN
10.0000 meq | INTRAVENOUS | Status: AC
  Administered 2022-03-01 (×5): 10 meq via INTRAVENOUS
  Filled 2022-03-01 (×5): qty 50

## 2022-03-01 MED ORDER — ACETAMINOPHEN 325 MG PO TABS: 650.0000 mg | ORAL_TABLET | Freq: Four times a day (QID) | ORAL | Status: DC | PRN

## 2022-03-01 MED ORDER — MORPHINE BOLUS VIA INFUSION: 5.0000 mg | INTRAVENOUS | Status: DC | PRN

## 2022-03-01 MED ORDER — ACETAMINOPHEN 650 MG RE SUPP: 650.0000 mg | Freq: Four times a day (QID) | RECTAL | Status: DC | PRN

## 2022-03-01 MED ORDER — SODIUM CHLORIDE 0.9 % IV BOLUS
1000.0000 mL | Freq: Once | INTRAVENOUS | Status: AC
  Administered 2022-03-01: 1000 mL via INTRAVENOUS

## 2022-03-01 MED ORDER — ORAL CARE MOUTH RINSE
15.0000 mL | OROMUCOSAL | Status: DC
  Administered 2022-03-01 (×2): 15 mL via OROMUCOSAL

## 2022-03-01 MED ORDER — GLYCOPYRROLATE 0.2 MG/ML IJ SOLN
0.1000 mg | Freq: Four times a day (QID) | INTRAMUSCULAR | Status: DC | PRN
  Administered 2022-03-01: 0.1 mg via INTRAVENOUS
  Filled 2022-03-01: qty 1

## 2022-03-01 MED ORDER — NOREPINEPHRINE 4 MG/250ML-% IV SOLN
0.0000 ug/min | INTRAVENOUS | Status: DC
  Administered 2022-03-01: 2 ug/min via INTRAVENOUS
  Filled 2022-03-01: qty 250

## 2022-03-01 MED ORDER — LORAZEPAM 2 MG/ML IJ SOLN
1.0000 mg | Freq: Four times a day (QID) | INTRAMUSCULAR | Status: DC | PRN
  Administered 2022-03-01: 1 mg via INTRAVENOUS
  Filled 2022-03-01: qty 1

## 2022-03-01 MED ORDER — ORAL CARE MOUTH RINSE: 15.0000 mL | OROMUCOSAL | Status: DC | PRN

## 2022-03-01 MED ORDER — RAMELTEON 8 MG PO TABS
8.0000 mg | ORAL_TABLET | Freq: Every day | ORAL | Status: DC
  Filled 2022-03-01: qty 1

## 2022-03-01 MED ORDER — GLYCOPYRROLATE 1 MG PO TABS: 1.0000 mg | ORAL_TABLET | ORAL | Status: DC | PRN

## 2022-03-01 MED ORDER — LORAZEPAM 0.5 MG PO TABS: 1.0000 mg | ORAL_TABLET | Freq: Four times a day (QID) | ORAL | Status: DC | PRN

## 2022-03-01 MED ORDER — MORPHINE 100MG IN NS 100ML (1MG/ML) PREMIX INFUSION
0.0000 mg/h | INTRAVENOUS | Status: DC
  Administered 2022-03-01: 5 mg/h via INTRAVENOUS
  Filled 2022-03-01: qty 100

## 2022-03-01 NOTE — Progress Notes (Signed)
Subjective:  No new complaints  Antibiotics:  Anti-infectives (From admission, onward)    Start     Dose/Rate Route Frequency Ordered Stop   02/28/22 1400  ceFAZolin (ANCEF) IVPB 2g/100 mL premix        2 g 200 mL/hr over 30 Minutes Intravenous Every 8 hours 02/28/22 1111     02/27/22 1800  ceFEPIme (MAXIPIME) 2 g in sodium chloride 0.9 % 100 mL IVPB  Status:  Discontinued        2 g 200 mL/hr over 30 Minutes Intravenous Every 8 hours 02/27/22 1140 02/28/22 1111   02/27/22 1800  metroNIDAZOLE (FLAGYL) IVPB 500 mg        500 mg 100 mL/hr over 60 Minutes Intravenous Every 12 hours 02/27/22 1140 03/04/22 1759   02/26/22 1730  ceFEPIme (MAXIPIME) 1 g in sodium chloride 0.9 % 100 mL IVPB  Status:  Discontinued        1 g 200 mL/hr over 30 Minutes Intravenous Every 8 hours 02/26/22 1634 02/27/22 1140   02/25/22 1000  vancomycin (VANCOREADY) IVPB 750 mg/150 mL  Status:  Discontinued        750 mg 150 mL/hr over 60 Minutes Intravenous Every 12 hours 01/30/2022 2206 02/25/22 1333   01/31/2022 2230  cefTRIAXone (ROCEPHIN) 2 g in sodium chloride 0.9 % 100 mL IVPB  Status:  Discontinued        2 g 200 mL/hr over 30 Minutes Intravenous Every 24 hours 02/20/2022 2204 02/26/22 1620   02/02/2022 2230  doxycycline (VIBRAMYCIN) 100 mg in sodium chloride 0.9 % 250 mL IVPB  Status:  Discontinued        100 mg 125 mL/hr over 120 Minutes Intravenous Every 12 hours 02/25/2022 2204 02/27/22 1140   02/28/2022 2200  ceFEPIme (MAXIPIME) 2 g in sodium chloride 0.9 % 100 mL IVPB  Status:  Discontinued        2 g 200 mL/hr over 30 Minutes Intravenous  Once 02/22/2022 2114 02/11/2022 2212   02/20/2022 2145  vancomycin (VANCOREADY) IVPB 1500 mg/300 mL        1,500 mg 150 mL/hr over 120 Minutes Intravenous  Once 02/15/2022 2138 02/25/22 0129       Medications: Scheduled Meds:  Chlorhexidine Gluconate Cloth  6 each Topical Daily   insulin aspart  0-15 Units Subcutaneous TID WC   insulin aspart  0-5 Units  Subcutaneous QHS   potassium chloride  20 mEq Oral BID   ramelteon  8 mg Oral QHS   sodium chloride flush  10-40 mL Intracatheter Q12H   sodium chloride flush  3 mL Intravenous Q12H   sucralfate  1 g Oral QID   Tbo-filgastrim (GRANIX) SQ  480 mcg Subcutaneous q1800   Continuous Infusions:   ceFAZolin (ANCEF) IV Stopped (02/23/2022 1413)   metronidazole Stopped (02/04/2022 0618)   PRN Meds:.acetaminophen **OR** acetaminophen, albuterol, bisacodyl, lidocaine-prilocaine, LORazepam, naLOXone (NARCAN)  injection, mouth rinse, polyethylene glycol, sodium chloride flush    Objective: Weight change: -1.1 kg  Intake/Output Summary (Last 24 hours) at 02/23/2022 1512 Last data filed at 02/19/2022 1500 Gross per 24 hour  Intake 774.71 ml  Output 850 ml  Net -75.29 ml    Blood pressure (!) 99/56, pulse 99, temperature 99.1 F (37.3 C), temperature source Oral, resp. rate (!) 26, height 6' (1.829 m), weight 68 kg, SpO2 92 %. Temp:  [98.4 F (36.9 C)-99.3 F (37.4 C)] 99.1 F (37.3 C) (01/31 1127) Pulse Rate:  [86-114]  99 (01/31 1500) Resp:  [18-37] 26 (01/31 1500) BP: (90-165)/(48-88) 99/56 (01/31 1500) SpO2:  [90 %-98 %] 92 % (01/31 1500) FiO2 (%):  [55 %-60 %] 55 % (01/31 1420) Weight:  [68 kg] 68 kg (01/31 0456)  Physical Exam: Physical Exam Constitutional:      Appearance: He is ill-appearing.  HENT:     Head: Normocephalic and atraumatic.  Eyes:     General:        Right eye: No discharge.        Left eye: No discharge.     Extraocular Movements: Extraocular movements intact.  Cardiovascular:     Rate and Rhythm: Tachycardia present.     Heart sounds: No murmur heard.    No friction rub. No gallop.  Pulmonary:     Effort: Pulmonary effort is normal. No respiratory distress.  Abdominal:     General: There is no distension.  Skin:    Coloration: Skin is pale.     Findings: Bruising present.  Neurological:     General: No focal deficit present.     Mental Status: He is  alert. He is disoriented.  Psychiatric:        Mood and Affect: Mood normal.        Behavior: Behavior normal.        Thought Content: Thought content normal.        Judgment: Judgment normal.      CBC:    BMET Recent Labs    02/28/22 0351 02/28/2022 0159  NA 145 146*  K 3.7 3.2*  CL 103 102  CO2 32 32  GLUCOSE 210* 167*  BUN 25* 26*  CREATININE 0.72 0.74  CALCIUM 7.8* 7.8*      Liver Panel  Recent Labs    02/27/22 0317  PROT 4.6*  ALBUMIN <1.5*  AST 47*  ALT 83*  ALKPHOS 234*  BILITOT 0.8        Sedimentation Rate No results for input(s): "ESRSEDRATE" in the last 72 hours. C-Reactive Protein No results for input(s): "CRP" in the last 72 hours.  Micro Results: Recent Results (from the past 720 hour(s))  Blood Culture (routine x 2)     Status: Abnormal   Collection Time: 02/10/2022  7:15 PM   Specimen: BLOOD  Result Value Ref Range Status   Specimen Description BLOOD SITE NOT SPECIFIED  Final   Special Requests   Final    BOTTLES DRAWN AEROBIC AND ANAEROBIC Blood Culture results may not be optimal due to an excessive volume of blood received in culture bottles   Culture  Setup Time   Final    GRAM NEGATIVE RODS IN BOTH AEROBIC AND ANAEROBIC BOTTLES CRITICAL RESULT CALLED TO, READ BACK BY AND VERIFIED WITH:  C/ PHARMD J. MILLEN 02/25/22 1324 A. LAFRANCE Performed at [facility], [place] [address], [place], [place] [address]    Culture ESCHERICHIA COLI (A)  Final   Report Status 02/27/2022 FINAL  Final   Organism ID, Bacteria ESCHERICHIA COLI  Final      Susceptibility   Escherichia coli - MIC*    AMPICILLIN >=32 RESISTANT Resistant     CEFAZOLIN <=4 SENSITIVE Sensitive     CEFEPIME <=0.12 SENSITIVE Sensitive     CEFTAZIDIME <=1 SENSITIVE Sensitive     CEFTRIAXONE <=0.25 SENSITIVE Sensitive     CIPROFLOXACIN <=0.25 SENSITIVE Sensitive     GENTAMICIN <=1 SENSITIVE Sensitive     IMIPENEM <=0.25 SENSITIVE Sensitive     TRIMETH/SULFA >=  320  RESISTANT Resistant     AMPICILLIN/SULBACTAM >=32 RESISTANT Resistant     PIP/TAZO <=4 SENSITIVE Sensitive     * ESCHERICHIA COLI  Blood Culture ID Panel (Reflexed)     Status: Abnormal   Collection Time: 02/07/2022  7:15 PM  Result Value Ref Range Status   Enterococcus faecalis NOT DETECTED NOT DETECTED Final   Enterococcus Faecium NOT DETECTED NOT DETECTED Final   Listeria monocytogenes NOT DETECTED NOT DETECTED Final   Staphylococcus species NOT DETECTED NOT DETECTED Final   Staphylococcus aureus (BCID) NOT DETECTED NOT DETECTED Final   Staphylococcus epidermidis NOT DETECTED NOT DETECTED Final   Staphylococcus lugdunensis NOT DETECTED NOT DETECTED Final   Streptococcus species NOT DETECTED NOT DETECTED Final   Streptococcus agalactiae NOT DETECTED NOT DETECTED Final   Streptococcus pneumoniae NOT DETECTED NOT DETECTED Final   Streptococcus pyogenes NOT DETECTED NOT DETECTED Final   A.calcoaceticus-baumannii NOT DETECTED NOT DETECTED Final   Bacteroides fragilis NOT DETECTED NOT DETECTED Final   Enterobacterales DETECTED (A) NOT DETECTED Final    Comment: Enterobacterales represent a large order of gram negative bacteria, not a single organism. CRITICAL RESULT CALLED TO, READ BACK BY AND VERIFIED WITH:  C/ PHARMD J. MILLEN 02/25/22 1324 A. LAFRANCE    Enterobacter cloacae complex NOT DETECTED NOT DETECTED Final   Escherichia coli DETECTED (A) NOT DETECTED Final    Comment: CRITICAL RESULT CALLED TO, READ BACK BY AND VERIFIED WITH:  C/ PHARMD J. MILLEN 02/25/22 1324 A. LAFRANCE    Klebsiella aerogenes NOT DETECTED NOT DETECTED Final   Klebsiella oxytoca NOT DETECTED NOT DETECTED Final   Klebsiella pneumoniae NOT DETECTED NOT DETECTED Final   Proteus species NOT DETECTED NOT DETECTED Final   Salmonella species NOT DETECTED NOT DETECTED Final   Serratia marcescens NOT DETECTED NOT DETECTED Final   Haemophilus influenzae NOT DETECTED NOT DETECTED Final   Neisseria meningitidis NOT  DETECTED NOT DETECTED Final   Pseudomonas aeruginosa NOT DETECTED NOT DETECTED Final   Stenotrophomonas maltophilia NOT DETECTED NOT DETECTED Final   Candida albicans NOT DETECTED NOT DETECTED Final   Candida auris NOT DETECTED NOT DETECTED Final   Candida glabrata NOT DETECTED NOT DETECTED Final   Candida krusei NOT DETECTED NOT DETECTED Final   Candida parapsilosis NOT DETECTED NOT DETECTED Final   Candida tropicalis NOT DETECTED NOT DETECTED Final   Cryptococcus neoformans/gattii NOT DETECTED NOT DETECTED Final   CTX-M ESBL NOT DETECTED NOT DETECTED Final   Carbapenem resistance IMP NOT DETECTED NOT DETECTED Final   Carbapenem resistance KPC NOT DETECTED NOT DETECTED Final   Carbapenem resistance NDM NOT DETECTED NOT DETECTED Final   Carbapenem resist OXA 48 LIKE NOT DETECTED NOT DETECTED Final   Carbapenem resistance VIM NOT DETECTED NOT DETECTED Final    Comment: Performed at [facility], [address] [place], [place] [address]  Blood Culture (routine x 2)     Status: Abnormal   Collection Time: 02/28/2022  7:30 PM   Specimen: BLOOD  Result Value Ref Range Status   Specimen Description BLOOD SITE NOT SPECIFIED  Final   Special Requests   Final    BOTTLES DRAWN AEROBIC AND ANAEROBIC Blood Culture results may not be optimal due to an excessive volume of blood received in culture bottles   Culture  Setup Time   Final    ANAEROBIC BOTTLE ONLY GRAM NEGATIVE RODS CRITICAL VALUE NOTED.  VALUE IS CONSISTENT WITH PREVIOUSLY REPORTED AND CALLED VALUE.    Culture (A)  Final    ESCHERICHIA COLI SUSCEPTIBILITIES PERFORMED ON PREVIOUS CULTURE WITHIN THE LAST 5 DAYS. Performed at [facility], [place] [address] [place], [place] [address]    Report Status 02/27/2022 FINAL  Final  Resp panel by RT-PCR (RSV, Flu A&B, Covid) Anterior Nasal Swab     Status: None   Collection Time: 02/25/22  8:59 AM   Specimen: Anterior Nasal Swab  Result Value Ref Range Status   SARS Coronavirus 2  by RT PCR NEGATIVE NEGATIVE Final    Comment: (NOTE) SARS-CoV-2 target nucleic acids are NOT DETECTED.  The SARS-CoV-2 RNA is generally detectable in upper respiratory specimens during the acute phase of infection. The lowest concentration of SARS-CoV-2 viral copies this assay can detect is 138 copies/mL. A negative result does not preclude SARS-Cov-2 infection and should not be used as the sole basis for treatment or other patient management decisions. A negative result may occur with  improper specimen collection/handling, submission of specimen other than nasopharyngeal swab, presence of viral mutation(s) within the areas targeted by this assay, and inadequate number of viral copies(<138 copies/mL). A negative result must be combined with clinical observations, patient history, and epidemiological information. The expected result is Negative.  Fact Sheet for Patients:  EntrepreneurPulse.com.au  Fact Sheet for Healthcare Providers:  IncredibleEmployment.be  This test is no t yet approved or cleared by the [place] FDA and  has been authorized for detection and/or diagnosis of SARS-CoV-2 by FDA under an Emergency Use Authorization (EUA). This EUA will remain  in effect (meaning this test can be used) for the duration of the COVID-19 declaration under Section 564(b)(1) of the Act, 21 U.S.C.section 360bbb-3(b)(1), unless the authorization is terminated  or revoked sooner.       Influenza A by PCR NEGATIVE NEGATIVE Final   Influenza B by PCR NEGATIVE NEGATIVE Final    Comment: (NOTE) The Xpert Xpress SARS-CoV-2/FLU/RSV plus assay is intended as an aid in the diagnosis of influenza from Nasopharyngeal swab specimens and should not be used as a sole basis for treatment. Nasal washings and aspirates are unacceptable for Xpert Xpress SARS-CoV-2/FLU/RSV testing.  Fact Sheet for Patients: EntrepreneurPulse.com.au  Fact  Sheet for Healthcare Providers: IncredibleEmployment.be  This test is not yet approved or cleared by the [place] FDA and has been authorized for detection and/or diagnosis of SARS-CoV-2 by FDA under an Emergency Use Authorization (EUA). This EUA will remain in effect (meaning this test can be used) for the duration of the COVID-19 declaration under Section 564(b)(1) of the Act, 21 U.S.C. section 360bbb-3(b)(1), unless the authorization is terminated or revoked.     Resp Syncytial Virus by PCR NEGATIVE NEGATIVE Final    Comment: (NOTE) Fact Sheet for Patients: EntrepreneurPulse.com.au  Fact Sheet for Healthcare Providers: IncredibleEmployment.be  This test is not yet approved or cleared by the [place] FDA and has been authorized for detection and/or diagnosis of SARS-CoV-2 by FDA under an Emergency Use Authorization (EUA). This EUA will remain in effect (meaning this test can be used) for the duration of the COVID-19 declaration under Section 564(b)(1) of the Act, 21 U.S.C. section 360bbb-3(b)(1), unless the authorization is terminated or revoked.  Performed at [facility], [place] [address] [place], [place] [address]   MRSA Next Gen by PCR, Nasal     Status: None   Collection Time: 02/25/22  8:59 AM  Result Value Ref Range Status   MRSA by PCR Next Gen NOT DETECTED NOT DETECTED Final  Comment: (NOTE) The GeneXpert MRSA Assay (FDA approved for NASAL specimens only), is one component of a comprehensive MRSA colonization surveillance program. It is not intended to diagnose MRSA infection nor to guide or monitor treatment for MRSA infections. Test performance is not FDA approved in patients less than 14 years old. Performed at [facility], [place] [address], [place], [place] [address]   Culture, blood (Routine X 2) w Reflex to ID Panel     Status: None (Preliminary result)   Collection Time: 02/27/22   6:04 PM   Specimen: BLOOD  Result Value Ref Range Status   Specimen Description BLOOD LEFT ANTECUBITAL  Final   Special Requests   Final    BOTTLES DRAWN AEROBIC AND ANAEROBIC Blood Culture adequate volume   Culture   Final    NO GROWTH 2 DAYS Performed at [facility], [place] [address], [place], [place] [address]    Report Status PENDING  Incomplete  Culture, blood (Routine X 2) w Reflex to ID Panel     Status: None (Preliminary result)   Collection Time: 02/27/22  6:04 PM   Specimen: BLOOD LEFT HAND  Result Value Ref Range Status   Specimen Description BLOOD LEFT HAND  Final   Special Requests   Final    BOTTLES DRAWN AEROBIC AND ANAEROBIC Blood Culture adequate volume   Culture   Final    NO GROWTH 2 DAYS Performed at [facility], [place] [address] [place], [place] [address]    Report Status PENDING  Incomplete  Body fluid culture w Gram Stain     Status: None (Preliminary result)   Collection Time: 02/28/22  9:45 AM   Specimen: Pleural Fluid  Result Value Ref Range Status   Specimen Description PLEURAL  Final   Special Requests NONE  Final   Gram Stain   Final    FEW WBC PRESENT,BOTH PMN AND MONONUCLEAR NO ORGANISMS SEEN    Culture   Final    NO GROWTH < 24 HOURS Performed at [facility], [place] [address] [place], [place] [address]    Report Status PENDING  Incomplete    Studies/Results: DG CHEST PORT 1 VIEW  Result Date: 02/19/2022 CLINICAL DATA:  Status post left thoracentesis EXAM: PORTABLE CHEST 1 VIEW COMPARISON:  Chest radiograph dated 02/28/2022 FINDINGS: Lines/tubes: Right chest wall port tip projects over the superior cavoatrial junction. Chest: Similar bibasilar patchy opacities, left-greater-than-right. Unchanged thin curvilinear lucency projecting over the right upper quadrant. Pleura: Persistent small bilateral pleural effusions. No pneumothorax. Heart/mediastinum: Similar  cardiomediastinal silhouette. Bones: Median sternotomy wires are  nondisplaced. IMPRESSION: 1. Persistent small bilateral pleural effusions. No pneumothorax. 2. Similar bibasilar patchy opacities, left-greater-than-right. 3. Unchanged thin curvilinear lucency projecting over the right upper quadrant is favored to reflect skin folds. Consider upright or left lateral decubitus radiograph of the abdomen if there is clinical concern for pneumoperitoneum. Electronically Signed   By: Darrin Nipper M.D.   On: 02/25/2022 09:49   DG Chest Port 1 View  Result Date: 02/28/2022 CLINICAL DATA:  Status post thoracentesis EXAM: PORTABLE CHEST 1 VIEW COMPARISON:  Chest x-ray 02/27/2022 FINDINGS: Interval reduction in size of the left pleural fluid collection. No postprocedural pneumothorax is identified. There is a persistent small right pleural effusion. Persistent interstitial and airspace process in the lungs but improved aeration when compared to yesterday's film which would suggest resolving pulmonary edema. IMPRESSION: 1. Interval reduction in size of the left pleural fluid collection. No postprocedural  pneumothorax. 2. Persistent small right pleural effusion. 3. Improving pulmonary edema. Electronically Signed   By: Marijo Sanes M.D.   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 is a 72 y.o. male with metastatic lung cancer admitted with sepsis and E. coli bacteremia with concern for  Port-A-Cath infection  #1 E coli bacteremia  Continue cefazolin  He needs port removed but needs platelets to be higher and resp status improved  #2 ? Pneumonia have flagyl on board with cefazolin for this but will compete 5 day course   #3 Exudative pleural effusion: likely from his lung cancer  #5 Metastatic lung cancer on chemotherapy recently  #6 Pancytopenia from chemotherapy  I spent 52 minutes with the patient including than 50% of the time in face to face and non face to face time with the  patien along with review of medical records in preparation for the visit and during the visit and in coordination of his care.     The patient did not seem to completely understand what I was talking about when I talked him about port removal but his wife did and she was agreeable to it being removed.  Consulted IR to remove port  Would get repeat blood cultures after port removal  #2 ? Pna: Will distinguish pulmonary infections from his metastatic lung cancer:  We are continuing metronidazole for now for anaerobic coverage will give him a limited course.  #3 Pleural effusion: LDH at 544, protein < 3. Will add serum LDH. May be exudate based on LDH, will followup cultures  I spent 52 minutes with the patient including than 50%  in face to face and non face to face time with the patient  along with review of medical records in preparation for the visit and during the visit and in coordination of his care.    LOS: 5 days   Alcide Evener 02/03/2022, 3:12 PM

## 2022-03-01 NOTE — Progress Notes (Signed)
At 1730 patient's BP 65/48 (55). This RN to bedside. Patient only responsive to pain. MD Eric [place] paged. Filer and MD [place] to bedside. New orders placed see MAR.

## 2022-03-01 NOTE — Progress Notes (Signed)
I responded to a page from the nurse to provide spiritual support for the patient and family. I arrived at the patient's room where his wife, son, and sister were present. I provided spiritual support through pastoral presence, reading scripture, and leading in prayer.    02/26/2022 2338  Spiritual Encounters  Type of Visit Follow up  Care provided to: Pt and family  Conversation partners present during encounter Nurse  Referral source Nurse (RN/NT/LPN)  Reason for visit Patient death  OnCall Visit Yes  Interventions  Spiritual Care Interventions Made Compassionate presence;Prayer;Encouragement;Bereavement/grief support    Chaplain Dr Redgie Grayer

## 2022-03-01 NOTE — Progress Notes (Signed)
This RN to bedside at approximately 1635. Patient moaning, experiencing labored breathing, and tachypneic. MD Eric [place] notified. New orders placed see MAR.

## 2022-03-01 NOTE — Progress Notes (Signed)
Removed patient from heated high flow and placed on 2lpm per family request.

## 2022-03-01 NOTE — Progress Notes (Signed)
PROGRESS NOTE    Kelly Olson.  RWE:[id number] DOB: 1950-05-02 DOA: 02/15/2022 PCP: Wenda Low, MD    Brief Narrative:   Kelly Olson. is a 72 y.o. 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  Patient was noted by home health to have oxygen saturations in the 60s on his 4 L nasal cannula.  Recently admitted 1 month ago with acute hypoxic respiratory failure due to pneumonia and COPD exacerbation, and was discharged on 2 L nasal cannula.  He reports has been feeling more short of breath the last few days prior to admission in which she was slowly increasing the amount of oxygen he was using.  When home health nurse arrived his oxygen saturation was noted to be in the 60s.  Patient does report nonproductive cough but denied fever, chills or chest pain.  In the ED, temperature 100.0 F, HR 101, RR 21, BP 120/67, SpO2 99% on NRB.  Sodium 142, potassium 2.3, chloride 93, CO2 38, glucose 265, BUN 24, creatinine 0.81.  Alkaline phosphatase 229, AST 90, ALT 119, total bilirubin 1.0.  BNP 697.7.  Lactic acid 1.6.  INR 1.3.  WBC 0.2, hemoglobin 5.5, platelets 6.  Blood cultures were collected and patient was started on IV vancomycin/cefepime, 1 L LR, and given 40 mill equivalents of IV potassium.  2 unit PRBC and 1 unit platelet were ordered for transfusion.  Hematology/oncology was consulted.  TRH consulted for admission for further evaluation and management of acute on chronic hypoxic respiratory failure, pancytopenia.  Assessment & Plan:   Acute on chronic hypoxic respiratory failure Pneumonia, suspect gram-negative organism Pleural effusion Recently discharged on  2 L nasal cannula with increasing oxygen needs.  Chest x-ray on admission with emphysema, interval worsening of left greater than right heterogeneous airspace disease consistent with edema versus pneumonia.  Strep pneumo antigen negative, urine Legionella antigen negative.  Underwent thoracentesis by PCCM on 1/30 with 800 mL fluid removed. -- Pleural fluid culture with no organisms on Gram stain, further pending -- Continue antibiotics as below with cefazolin and metronidazole -- Albuterol neb every 6 hours as needed wheezing/shortness of breath -- Continue supplemental oxygen, currently on 55 L high flow nasal cannula -- Aspiration precautions  Pancytopenia CBC on admission with WBC 0.2, absolute neutrophil count 0.1, hemoglobin 5.5, platelets 6.  Transfused 2 unit PRBCs and 3 unit platelets during hospitalization.  Oncology was consulted and recommended G-CSF.  Etiology likely secondary to ongoing chemotherapy, no active bleeding. --ANC 0.1>>3.7 --hgb 5.5>>8.5>>7.1>7.3 --Plt 6>>18>13 --Continue GCSF, will discontinue after dose tonight --CBC w/ diff daily --Transfuse for hemoglobin less than 7.0, platelets less than 10  E. coli bacteremia Blood cultures 3 out of 4 on 1/26 positive for E. coli with resistance to Bactrim, ampicillin/sulbactam. -- ID following, appreciate assistance -- Repeat blood cultures 1/29 with no growth x 2 days -- Continue cefazolin 2g IV q8h -- Continue metronidazole 500mg  IV q12h -- ID recommends port removal, but given thrombocytopenia and high O2 needs, PCCM recommends holding off on this for now  Hypokalemia Hospitalization, potassium 3.2 this morning, will continue repletion. -- Repeat electrolytes in a.m. include magnesium  Elevated LFTs Stable, likely related to liver metastatic disease. -- Hold  home statin  Dysphagia -- SLP following, currently n.p.o. -- Aspiration precautions  Small cell lung cancer with metastasis to bone/brain Follows with  medical oncology outpatient, Dr. Julien Nordmann.  Currently undergoing treatment with carboplatin, etoposide, Cosela and Imfinzi.  -- Palliative care following, appreciate assistance, now DNR, overall prognosis poor  Chronic diastolic congestive heart failure, compensated Ischemic cardiomyopathy with recovered LVEF CAD Essential hypertension -- Hold aspirin/statin, Coreg/losartan -- Strict I's and O's and daily weights -- Continue monitor BP closely  Prolonged Qtc -- Avoid QT prolonging medications  Protein calorie malnutrition -- Nutrition/RD following -- If no plan to transition to comfort measures, will need core track tube placement  Goals of care Patient now DNR/DNI.  Palliative care following, appreciate assistance.  If no improvements and continued deterioration would recommend transitioning to comfort care.   DVT prophylaxis: SCDs Start: 02/19/2022 2202    Code Status: DNR Family Communication:   Disposition Plan:  Level of care: ICU Status is: Inpatient Remains inpatient appropriate because: Continues on 55 L high flow nasal cannula, IV antibiotics, overall prognosis poor    Consultants:  PCCM Palliative care Infectious disease  Procedures:  Thoracentesis 1/30  Antimicrobials:  Vancomycin 1/26 - 1/27 Doxycycline 1/26 - 1/28 Cefepime 1/29 - 1/30 Cefazolin 1/30>> Metronidazole 1/29>>   Subjective: Patient seen examined at bedside, resting in bed.  Remains on 55 L high flow nasal cannula.  Seen by PCCM this morning.  Continues on IV antibiotics.  Overall very poor prognosis.  Appears to be more alert today.  Discussed with RN.  Patient with no other complaints or concerns at this time.  No family present at bedside.  Denies headache, no dizziness, no chest pain, no palpitations, no fever, no abdominal pain.  No acute concerns overnight per nursing staff.  Objective: Vitals:   02/07/2022 1000 02/10/2022 1100 02/04/2022 1127 02/28/2022 1200  BP: 110/72 107/71  107/70   Pulse: (!) 110 (!) 104  (!) 104  Resp: (!) 26 (!) 32  (!) 22  Temp:   99.1 F (37.3 C)   TempSrc:   Oral   SpO2: 96% 96%  94%  Weight:      Height:        Intake/Output Summary (Last 24 hours) at 02/01/2022 1341 Last data filed at 02/23/2022 9892 Gross per 24 hour  Intake 573.39 ml  Output 850 ml  Net -276.61 ml   Filed Weights   02/27/22 0314 02/28/22 0500 02/14/2022 0456  Weight: 68.8 kg 69.1 kg 68 kg    Examination:  Physical Exam: GEN: NAD, alert and oriented to place ([place]), Time (2024), but not person Psychologist, counselling), phonically ill appearance, appears older than stated age HEENT: NCAT, PERRL, EOMI, sclera clear, MMM PULM: Coarse breath sounds bilaterally, normal Respaire effort without accessory muscle use, on 55 L high flow nasal cannula CV: RRR w/o M/G/R GI: abd soft, NTND, NABS, no R/G/M MSK: Moves all extremities independently, trace bilateral peripheral edema to mid shin NEURO: No focal deficits Integumentary: dry/intact, no rashes or wounds    Data Reviewed: I have personally reviewed following labs and imaging studies  CBC: Recent Labs  Lab 02/25/22 0452 02/25/22 1744 02/26/22 0622 02/27/22 0317 02/28/22 0351 02/27/2022 0159  WBC 0.3* 0.6* 0.6* 1.2* 2.5* 4.9  NEUTROABS 0.2*  --  0.4* 1.1* 2.0 3.7  HGB 8.4* 8.5* 8.2* 7.8* 7.1* 7.3*  HCT 23.6* 25.0* 25.0* 23.1* 21.5* 22.7*  MCV 91.1 93.6 95.1 94.7 96.8 97.0  PLT 11* 11* 6* 9* 18*  13*   Basic Metabolic Panel: Recent Labs  Lab 02/25/22 1744 02/26/22 0622 02/27/22 0317 02/28/22 0351 02/13/2022 0159  NA 146* 146* 144 145 146*  K 3.4* 3.0* 2.8* 3.7 3.2*  CL 100 102 98 103 102  CO2 37* 33* 36* 32 32  GLUCOSE 119* 92 166* 210* 167*  BUN 21 21 20  25* 26*  CREATININE 0.72 0.64 0.64 0.72 0.74  CALCIUM 7.7* 7.8* 7.8* 7.8* 7.8*  MG 1.6*  --   --  1.9  --   PHOS 1.8*  --   --   --   --    GFR: Estimated Creatinine Clearance: 81.5 mL/min (by C-G formula based on SCr of 0.74 mg/dL). Liver  Function Tests: Recent Labs  Lab 02/06/2022 1936 02/25/22 0452 02/26/22 0622 02/27/22 0317  AST 90* 60* 50* 47*  ALT 119* 100* 87* 83*  ALKPHOS 229* 194* 173* 234*  BILITOT 1.0 1.5* 1.1 0.8  PROT 4.4* 4.0* 4.6* 4.6*  ALBUMIN 1.6* <1.5* <1.5* <1.5*   No results for input(s): "LIPASE", "AMYLASE" in the last 168 hours. No results for input(s): "AMMONIA" in the last 168 hours. Coagulation Profile: Recent Labs  Lab 01/30/2022 1936  INR 1.3*   Cardiac Enzymes: No results for input(s): "CKTOTAL", "CKMB", "CKMBINDEX", "TROPONINI" in the last 168 hours. BNP (last 3 results) No results for input(s): "PROBNP" in the last 8760 hours. HbA1C: No results for input(s): "HGBA1C" in the last 72 hours. CBG: Recent Labs  Lab 02/28/22 1149 02/28/22 1612 02/28/22 2152 02/11/2022 0622 02/28/2022 1124  GLUCAP 191* 187* 141* 157* 125*   Lipid Profile: No results for input(s): "CHOL", "HDL", "LDLCALC", "TRIG", "CHOLHDL", "LDLDIRECT" in the last 72 hours. Thyroid Function Tests: No results for input(s): "TSH", "T4TOTAL", "FREET4", "T3FREE", "THYROIDAB" in the last 72 hours. Anemia Panel: No results for input(s): "VITAMINB12", "FOLATE", "FERRITIN", "TIBC", "IRON", "RETICCTPCT" in the last 72 hours. Sepsis Labs: Recent Labs  Lab 02/23/2022 1936 02/09/2022 2029  LATICACIDVEN 1.6 1.3    Recent Results (from the past 240 hour(s))  Blood Culture (routine x 2)     Status: Abnormal   Collection Time: 02/01/2022  7:15 PM   Specimen: BLOOD  Result Value Ref Range Status   Specimen Description BLOOD SITE NOT SPECIFIED  Final   Special Requests   Final    BOTTLES DRAWN AEROBIC AND ANAEROBIC Blood Culture results may not be optimal due to an excessive volume of blood received in culture bottles   Culture  Setup Time   Final    GRAM NEGATIVE RODS IN BOTH AEROBIC AND ANAEROBIC BOTTLES CRITICAL RESULT CALLED TO, READ BACK BY AND VERIFIED WITH:  C/ PHARMD J. MILLEN 02/25/22 1324 A. LAFRANCE Performed at [facility], [place] [address] [place], [place] [address]    Culture ESCHERICHIA COLI (A)  Final   Report Status 02/27/2022 FINAL  Final   Organism ID, Bacteria ESCHERICHIA COLI  Final      Susceptibility   Escherichia coli - MIC*    AMPICILLIN >=32 RESISTANT Resistant     CEFAZOLIN <=4 SENSITIVE Sensitive     CEFEPIME <=0.12 SENSITIVE Sensitive     CEFTAZIDIME <=1 SENSITIVE Sensitive     CEFTRIAXONE <=0.25 SENSITIVE Sensitive     CIPROFLOXACIN <=0.25 SENSITIVE Sensitive     GENTAMICIN <=1 SENSITIVE Sensitive     IMIPENEM <=0.25 SENSITIVE Sensitive     TRIMETH/SULFA >=320 RESISTANT Resistant     AMPICILLIN/SULBACTAM >=32 RESISTANT Resistant     PIP/TAZO <=4 SENSITIVE Sensitive     *  ESCHERICHIA COLI  Blood Culture ID Panel (Reflexed)     Status: Abnormal   Collection Time: 02/23/2022  7:15 PM  Result Value Ref Range Status   Enterococcus faecalis NOT DETECTED NOT DETECTED Final   Enterococcus Faecium NOT DETECTED NOT DETECTED Final   Listeria monocytogenes NOT DETECTED NOT DETECTED Final   Staphylococcus species NOT DETECTED NOT DETECTED Final   Staphylococcus aureus (BCID) NOT DETECTED NOT DETECTED Final   Staphylococcus epidermidis NOT DETECTED NOT DETECTED Final   Staphylococcus lugdunensis NOT DETECTED NOT DETECTED Final   Streptococcus species NOT DETECTED NOT DETECTED Final   Streptococcus agalactiae NOT DETECTED NOT DETECTED Final   Streptococcus pneumoniae NOT DETECTED NOT DETECTED Final   Streptococcus pyogenes NOT DETECTED NOT DETECTED Final   A.calcoaceticus-baumannii NOT DETECTED NOT DETECTED Final   Bacteroides fragilis NOT DETECTED NOT DETECTED Final   Enterobacterales DETECTED (A) NOT DETECTED Final    Comment: Enterobacterales represent a large order of gram negative bacteria, not a single organism. CRITICAL RESULT CALLED TO, READ BACK BY AND VERIFIED WITH:  C/ PHARMD J. MILLEN 02/25/22 1324 A. LAFRANCE    Enterobacter cloacae complex NOT DETECTED NOT DETECTED  Final   Escherichia coli DETECTED (A) NOT DETECTED Final    Comment: CRITICAL RESULT CALLED TO, READ BACK BY AND VERIFIED WITH:  C/ PHARMD J. MILLEN 02/25/22 1324 A. LAFRANCE    Klebsiella aerogenes NOT DETECTED NOT DETECTED Final   Klebsiella oxytoca NOT DETECTED NOT DETECTED Final   Klebsiella pneumoniae NOT DETECTED NOT DETECTED Final   Proteus species NOT DETECTED NOT DETECTED Final   Salmonella species NOT DETECTED NOT DETECTED Final   Serratia marcescens NOT DETECTED NOT DETECTED Final   Haemophilus influenzae NOT DETECTED NOT DETECTED Final   Neisseria meningitidis NOT DETECTED NOT DETECTED Final   Pseudomonas aeruginosa NOT DETECTED NOT DETECTED Final   Stenotrophomonas maltophilia NOT DETECTED NOT DETECTED Final   Candida albicans NOT DETECTED NOT DETECTED Final   Candida auris NOT DETECTED NOT DETECTED Final   Candida glabrata NOT DETECTED NOT DETECTED Final   Candida krusei NOT DETECTED NOT DETECTED Final   Candida parapsilosis NOT DETECTED NOT DETECTED Final   Candida tropicalis NOT DETECTED NOT DETECTED Final   Cryptococcus neoformans/gattii NOT DETECTED NOT DETECTED Final   CTX-M ESBL NOT DETECTED NOT DETECTED Final   Carbapenem resistance IMP NOT DETECTED NOT DETECTED Final   Carbapenem resistance KPC NOT DETECTED NOT DETECTED Final   Carbapenem resistance NDM NOT DETECTED NOT DETECTED Final   Carbapenem resist OXA 48 LIKE NOT DETECTED NOT DETECTED Final   Carbapenem resistance VIM NOT DETECTED NOT DETECTED Final    Comment: Performed at [facility], [address] [place],  [address]  Blood Culture (routine x 2)     Status: Abnormal   Collection Time: 02/16/2022  7:30 PM   Specimen: BLOOD  Result Value Ref Range Status   Specimen Description BLOOD SITE NOT SPECIFIED  Final   Special Requests   Final    BOTTLES DRAWN AEROBIC AND ANAEROBIC Blood Culture results may not be optimal due to an excessive volume of blood received in culture bottles   Culture   Setup Time   Final    ANAEROBIC BOTTLE ONLY GRAM NEGATIVE RODS CRITICAL VALUE NOTED.  VALUE IS CONSISTENT WITH PREVIOUSLY REPORTED AND CALLED VALUE.    Culture (A)  Final    ESCHERICHIA COLI SUSCEPTIBILITIES PERFORMED ON PREVIOUS CULTURE WITHIN THE LAST 5 DAYS. Performed at [facility], [place]  [address], [place], [place] [address]    Report Status 02/27/2022 FINAL  Final  Resp panel by RT-PCR (RSV, Flu A&B, Covid) Anterior Nasal Swab     Status: None   Collection Time: 02/25/22  8:59 AM   Specimen: Anterior Nasal Swab  Result Value Ref Range Status   SARS Coronavirus 2 by RT PCR NEGATIVE NEGATIVE Final    Comment: (NOTE) SARS-CoV-2 target nucleic acids are NOT DETECTED.  The SARS-CoV-2 RNA is generally detectable in upper respiratory specimens during the acute phase of infection. The lowest concentration of SARS-CoV-2 viral copies this assay can detect is 138 copies/mL. A negative result does not preclude SARS-Cov-2 infection and should not be used as the sole basis for treatment or other patient management decisions. A negative result may occur with  improper specimen collection/handling, submission of specimen other than nasopharyngeal swab, presence of viral mutation(s) within the areas targeted by this assay, and inadequate number of viral copies(<138 copies/mL). A negative result must be combined with clinical observations, patient history, and epidemiological information. The expected result is Negative.  Fact Sheet for Patients:  EntrepreneurPulse.com.au  Fact Sheet for Healthcare Providers:  IncredibleEmployment.be  This test is no t yet approved or cleared by the [place] FDA and  has been authorized for detection and/or diagnosis of SARS-CoV-2 by FDA under an Emergency Use Authorization (EUA). This EUA will remain  in effect (meaning this test can be used) for the duration of the COVID-19 declaration under Section  564(b)(1) of the Act, 21 U.S.C.section 360bbb-3(b)(1), unless the authorization is terminated  or revoked sooner.       Influenza A by PCR NEGATIVE NEGATIVE Final   Influenza B by PCR NEGATIVE NEGATIVE Final    Comment: (NOTE) The Xpert Xpress SARS-CoV-2/FLU/RSV plus assay is intended as an aid in the diagnosis of influenza from Nasopharyngeal swab specimens and should not be used as a sole basis for treatment. Nasal washings and aspirates are unacceptable for Xpert Xpress SARS-CoV-2/FLU/RSV testing.  Fact Sheet for Patients: EntrepreneurPulse.com.au  Fact Sheet for Healthcare Providers: IncredibleEmployment.be  This test is not yet approved or cleared by the [place] FDA and has been authorized for detection and/or diagnosis of SARS-CoV-2 by FDA under an Emergency Use Authorization (EUA). This EUA will remain in effect (meaning this test can be used) for the duration of the COVID-19 declaration under Section 564(b)(1) of the Act, 21 U.S.C. section 360bbb-3(b)(1), unless the authorization is terminated or revoked.     Resp Syncytial Virus by PCR NEGATIVE NEGATIVE Final    Comment: (NOTE) Fact Sheet for Patients: EntrepreneurPulse.com.au  Fact Sheet for Healthcare Providers: IncredibleEmployment.be  This test is not yet approved or cleared by the [place] FDA and has been authorized for detection and/or diagnosis of SARS-CoV-2 by FDA under an Emergency Use Authorization (EUA). This EUA will remain in effect (meaning this test can be used) for the duration of the COVID-19 declaration under Section 564(b)(1) of the Act, 21 U.S.C. section 360bbb-3(b)(1), unless the authorization is terminated or revoked.  Performed at [facility], [place] [address] [place], [place] [address]   MRSA Next Gen by PCR, Nasal     Status: None   Collection Time: 02/25/22  8:59 AM  Result Value Ref Range  Status   MRSA by PCR Next Gen NOT DETECTED NOT DETECTED Final    Comment: (NOTE) The GeneXpert MRSA Assay (FDA approved for NASAL specimens only), is one component of a comprehensive MRSA colonization surveillance program. It  is not intended to diagnose MRSA infection nor to guide or monitor treatment for MRSA infections. Test performance is not FDA approved in patients less than 29 years old. Performed at [facility], [place] [address], [place], [place] [address]   Culture, blood (Routine X 2) w Reflex to ID Panel     Status: None (Preliminary result)   Collection Time: 02/27/22  6:04 PM   Specimen: BLOOD  Result Value Ref Range Status   Specimen Description BLOOD LEFT ANTECUBITAL  Final   Special Requests   Final    BOTTLES DRAWN AEROBIC AND ANAEROBIC Blood Culture adequate volume   Culture   Final    NO GROWTH 2 DAYS Performed at [facility], [place] [address], [place], [place] [address]    Report Status PENDING  Incomplete  Culture, blood (Routine X 2) w Reflex to ID Panel     Status: None (Preliminary result)   Collection Time: 02/27/22  6:04 PM   Specimen: BLOOD LEFT HAND  Result Value Ref Range Status   Specimen Description BLOOD LEFT HAND  Final   Special Requests   Final    BOTTLES DRAWN AEROBIC AND ANAEROBIC Blood Culture adequate volume   Culture   Final    NO GROWTH 2 DAYS Performed at [facility], [place] [address], [place], [place] [address]    Report Status PENDING  Incomplete  Body fluid culture w Gram Stain     Status: None (Preliminary result)   Collection Time: 02/28/22  9:45 AM   Specimen: Pleural Fluid  Result Value Ref Range Status   Specimen Description PLEURAL  Final   Special Requests NONE  Final   Gram Stain   Final    FEW WBC PRESENT,BOTH PMN AND MONONUCLEAR NO ORGANISMS SEEN    Culture   Final    NO GROWTH < 24 HOURS Performed at [facility], [place] [address] [place], [place] [address]    Report Status PENDING  Incomplete          Radiology Studies: DG CHEST PORT 1 VIEW  Result Date: 02/28/2022 CLINICAL DATA:  Status post left thoracentesis EXAM: PORTABLE CHEST 1 VIEW COMPARISON:  Chest radiograph dated 02/28/2022 FINDINGS: Lines/tubes: Right chest wall port tip projects over the superior cavoatrial junction. Chest: Similar bibasilar patchy opacities, left-greater-than-right. Unchanged thin curvilinear lucency projecting over the right upper quadrant. Pleura: Persistent small bilateral pleural effusions. No pneumothorax. Heart/mediastinum: Similar  cardiomediastinal silhouette. Bones: Median sternotomy wires are nondisplaced. IMPRESSION: 1. Persistent small bilateral pleural effusions. No pneumothorax. 2. Similar bibasilar patchy opacities, left-greater-than-right. 3. Unchanged thin curvilinear lucency projecting over the right upper quadrant is favored to reflect skin folds. Consider upright or left lateral decubitus radiograph of the abdomen if there is clinical concern for pneumoperitoneum. Electronically Signed   By: Darrin Nipper M.D.   On: 02/07/2022 09:49   DG Chest Port 1 View  Result Date: 02/28/2022 CLINICAL DATA:  Status post thoracentesis EXAM: PORTABLE CHEST 1 VIEW COMPARISON:  Chest x-ray 02/27/2022 FINDINGS: Interval reduction in size of the left pleural fluid collection. No postprocedural pneumothorax is identified. There is a persistent small right pleural effusion. Persistent interstitial and airspace process in the lungs but improved aeration when compared to yesterday's film which would suggest resolving pulmonary edema. IMPRESSION: 1. Interval reduction in size of the left pleural fluid collection. No postprocedural pneumothorax. 2. Persistent small right pleural effusion. 3. Improving pulmonary edema. Electronically Signed   By: Mamie Nick.  Gallerani M.D.   On: 02/28/2022 10:07        Scheduled Meds:  Chlorhexidine Gluconate Cloth  6 each Topical Daily   insulin aspart  0-15 Units Subcutaneous TID WC    insulin aspart  0-5 Units Subcutaneous QHS   mouth rinse  15 mL Mouth Rinse 4 times per day   potassium chloride  20 mEq Oral BID   ramelteon  8 mg Oral QHS   sodium chloride flush  10-40 mL Intracatheter Q12H   sodium chloride flush  3 mL Intravenous Q12H   sucralfate  1 g Oral QID   Tbo-filgastrim (GRANIX) SQ  480 mcg Subcutaneous q1800   Continuous Infusions:   ceFAZolin (ANCEF) IV Stopped (01/30/2022 8546)   metronidazole Stopped (02/04/2022 0618)     LOS: 5 days    Time spent: 56 minutes spent on chart review, discussion with nursing staff, consultants, updating family and interview/physical exam; more than 50% of that time was spent in counseling and/or coordination of care.    Gloria Ricardo J [place] DO [facility] Available via Epic secure chat 7am-7pm After these hours, please refer to coverage provider listed on [email or web address] 01/30/2022, 1:41 PM

## 2022-03-01 NOTE — Progress Notes (Signed)
[facility] Progress Note Patient Name: Kelly Olson. DOB: 04-20-1950 MRN: [id number]   Date of Service  02/14/2022  HPI/Events of Note  AHRF, PNA with CAP Small Cell  with secondary oancytopenia Plt 12k, K3.2, portacath in place, received thora yesterday   eICU Interventions  Plt above goal, will place KCL replacement, no evidence of bleeding.     Intervention Category Intermediate Interventions: Coagulopathy - evaluation and management  Deveion Denz 02/28/2022, 4:47 AM

## 2022-03-01 NOTE — Progress Notes (Signed)
Patient's BP 84/55 (65) on 18mcg of levophed. This RN assessed patient's breathing to be labored and tachypneic. This RN educated family on patient's condition. Family verbalized understanding. Family states they are "waiting on son to arrive" to transition patient to comfort care.

## 2022-03-01 NOTE — Progress Notes (Signed)
    OVERNIGHT PROGRESS REPORT  Notified by RN that patient has expired at 2248 on 02/23/2022  Patient was comfort care  2 RN verified.  Family was available to RN.  Kristopher Oppenheim, DO [facility]

## 2022-03-01 NOTE — Progress Notes (Signed)
Brief Nutrition Note:   Pt discussed during ICU rounds this AM. RD Currently NPO, not safe for po, high aspiration risk. Currently 55L HFNC, 55% FiO2. Noted possible transition to comfort care if no improvement.   Full Nutrition Assessment to follow pending [place] with plan for [place] on Friday if continuing current level of care. Discussed plan with PCCM.   Kerman Passey MS, RDN, LDN, CNSC Registered Dietitian 3 Clinical Nutrition RD Pager and On-Call Pager Number Located in [place]

## 2022-03-01 NOTE — Progress Notes (Signed)
More obtunded, BP dropping, mental status improvement this am may have been terminal lucidity.  Dr. [place] and I met with family to discuss current decline and its likely terminal nature.  When they are ready we will transition to allowing a natural peaceful death.  Condolences offered.  Erskine Emery MD PCCM

## 2022-03-01 NOTE — Progress Notes (Signed)
NAME:  Kelly Roig., MRN:  [id number], DOB:  January 19, 1951, LOS: 5 ADMISSION DATE:  02/22/2022, CONSULTATION DATE:  02/25/2022 REFERRING MD: Eliseo Squires [facility] CHIEF COMPLAINT: Respiratory distress   History of Present Illness:  72 year old man who presented 1/26 with several days of worsening SOB; found to be hypoxic in the 60s by home health despite 2-3L [place].  Denied recent fever, chills, N/V.  No change in his chronic bilateral LE edema, bruising more but no bleeding.  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 Admitted one month ago with PNA and AECOPD discharged on new home O2 2L.    On evaluation, consistent with SIRS; CXR c/w emphysema and L > R airspace disease with K 2.3, bicarb 38, elevated LFTs, BNP 689, Hgb 5.5, plts 6, WBC 0.3.  Oncology following. Patient had worsening respiratory distress, increasing O2 needs and decreasing mental status 1/27, PCCM consulted for further recs.    Pertinent Medical History:   Past Medical History:  Diagnosis Date   Coronary artery disease, occlusive 06/02/2014   Multivessel CAD.mLAD-100%, mRCA 99%, dRCA 100%, mLCX 90%  And EF 35-45%. -->  Referred for CABG; nonischemic Myoview March 2020   Former heavy tobacco smoker    Quit in April 2016    GERD (gastroesophageal reflux disease)    Hyperlipidemia with target LDL less than 70    Hypertension    Ischemic cardiomyopathy - resolved 05/2014   Myoview: EF ~33% with "infarction vs. severe resting ischemia in LAD & RCA territory; b) EF by Cath: 35-45%. 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   Stage  T2 a, N0   Significant Hospital Events: Including procedures, antibiotic start and stop dates in addition to other pertinent events   1/26 - Admitted TRH sepsis, PNA, pancytopenia, neutropenic fever 1/27 - Started on heated high flow for worsening shortness of breath.  Blood cultures positive for E. coli. 1/29 - Pt took oxygen off. Desaturated down to 70s. Cyanotic. Got some narcan too. Rapid response called. Requiring sxn to assist w/ cough Moved to ICU  1/30 - Thoracentesis with PCCM, 841mL amber-colored fluid removed. Exudative. 1/31 - Ongoing high O2 requirements preventing port removal  Interim History / Subjective:  No significant events overnight ?Aspiration event yesterday with sat drop to 80s, recovered Remains on significant O2 (55L, 55% FiO2) No complaints this morning, SOB feels "no worse than usual" Oriented, but ongoing memory concerns with repetitive comments PMT following, possible transition to comfort if no improvement  Objective:  Blood pressure 122/79, pulse (!) 107, temperature 99 F (37.2 C), temperature source Oral, resp. rate (!) 26, height 6' (1.829 m), weight 68 kg, SpO2 97 %.    FiO2 (%):  [58 %-100 %] 60 %   Intake/Output Summary (Last 24 hours) at 02/10/2022 0723 Last data filed at 01/30/2022 0700 Gross per 24 hour  Intake 612.47 ml  Output 850 ml  Net -237.53 ml    Filed Weights   02/27/22 0314 02/28/22 0500 02/14/2022 0456  Weight: 68.8 kg 69.1 kg 68 kg   Physical Examination:  General: Chronically ill-appearing elderly man in NAD. Pleasant and conversant. HEENT: [place]/AT, anicteric sclera, PERRL, dry mucous membranes. OptiFlow [place] in place. Neuro: Awake, oriented x 3-4. Responds to verbal stimuli. Following commands consistently. Moves all 4 extremities spontaneously. Generalized weakness. CV: RRR, no m/g/r. PULM: Breathing even and minimally labored on Optiflow (55L). Lung fields with scattered rhonchi, diminished at bilateral bases. GI: Soft,  nontender, nondistended. Normoactive bowel sounds. Extremities: Bilateral symmetric 1+ pitting LE edema noted. Skin: Warm/dry, ecchymosis to forearms.  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 Julien Nordmann, on carboplatin, etoposide, cosela, imfinzi s/p XRT brain. - Continue OptiFlow HHFNC - Wean FiO2 for O2 sat 88-92% - S/p thora 1/30 (872mL removed), pleural studies exudative - F/u pleural fluid Cx; narrow abx as able - Bronchodilators, hypertonic saline nebs - Pulmonary hygiene - Diuresis per Primary Team - Aspiration precautions - Onc/PMT following - DNR/DNI  Sepsis secondary to PNA and E. Coli bacteremia  - ID following, appreciate recs - Narrow abx to cefazolin, metronidazole per ID - Follow Cx data - Goal port removal once able to tolerate from a respiratory/hemodynamic stability standpoint  Acquired pancytopenia 2/2 chemo Anemia - Trend H&H - Monitor for signs of active bleeding - Transfuse for Hgb < 7.0, Plt < 10K or hemodynamically significant bleeding - Received GCSF; ANC 3.7 today 1/31  Protein calorie malnutrition - Nutrition/RD following, appreciate recs - If no plan for transition to comfort, would require Cortrak placement  GOC - DNR/DNI - PMT following, appreciate recs - Possible transition to comfort care if no improvement  Remainder per Primary Team  Best Practice: (right click and "Reselect all SmartList Selections" daily)   Diet/type: NPO w/ oral meds DVT prophylaxis: SCDs GI prophylaxis: PPI Lines: N/A Foley:  N/A Code Status:  DNR Last date of multidisciplinary goals of care discussion [1/30 - PMT]  Signature:   Rhae Lerner [facility] 02/04/2022 7:24 AM  Please see [email or web address] for pager details.  From 7A-7P if no response, please call [phone] After hours, please call ELink  [phone]

## 2022-03-02 DIAGNOSIS — B9620 Unspecified Escherichia coli [E. coli] as the cause of diseases classified elsewhere: Secondary | ICD-10-CM | POA: Diagnosis not present

## 2022-03-02 DIAGNOSIS — R7881 Bacteremia: Secondary | ICD-10-CM | POA: Diagnosis not present

## 2022-03-02 LAB — CYTOLOGY - NON PAP

## 2022-03-02 DEATH — deceased

## 2022-03-03 LAB — BODY FLUID CULTURE W GRAM STAIN: Culture: NO GROWTH

## 2022-03-04 LAB — CULTURE, BLOOD (ROUTINE X 2)
Culture: NO GROWTH
Culture: NO GROWTH
Special Requests: ADEQUATE
Special Requests: ADEQUATE

## 2022-03-07 ENCOUNTER — Other Ambulatory Visit: Payer: Medicare Other

## 2022-03-07 ENCOUNTER — Ambulatory Visit: Payer: Medicare Other

## 2022-03-07 ENCOUNTER — Ambulatory Visit: Payer: Medicare Other | Admitting: Internal Medicine

## 2022-03-08 ENCOUNTER — Ambulatory Visit: Payer: Medicare Other

## 2022-03-09 ENCOUNTER — Ambulatory Visit: Payer: Medicare Other

## 2022-03-14 ENCOUNTER — Other Ambulatory Visit: Payer: Medicare Other

## 2022-03-21 ENCOUNTER — Other Ambulatory Visit: Payer: Medicare Other

## 2022-03-28 ENCOUNTER — Encounter: Payer: Medicare Other | Admitting: Dietician

## 2022-03-28 ENCOUNTER — Other Ambulatory Visit: Payer: Medicare Other

## 2022-03-28 ENCOUNTER — Ambulatory Visit: Payer: Medicare Other

## 2022-03-28 ENCOUNTER — Ambulatory Visit: Payer: Medicare Other | Admitting: Internal Medicine

## 2022-03-29 ENCOUNTER — Ambulatory Visit: Payer: Medicare Other

## 2022-03-30 ENCOUNTER — Ambulatory Visit: Payer: Medicare Other

## 2022-03-31 NOTE — Death Summary Note (Signed)
DEATH SUMMARY   Patient Details  Name: Kelly Olson. MRN: [id number] DOB: 1950/02/28 WUX:LKGMWN, [place] Ar, MD Admission/Discharge Information   Admit Date:  March 23, 2022  Date of Death: Date of Death: 03-28-22 (Simultaneous filing. User may not have seen previous data.)  Time of Death: Time of Death: 06-May-2246 (Simultaneous filing. User may not have seen previous data.)  Length of Stay: 6   Principle Cause of death: Acute on chronic respiratory failure, multifocal pneumonia, E. 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, brain, liver  Hospital Diagnoses: Principal Problem:   E coli bacteremia Active Problems:   Coronary artery disease involving native coronary artery without angina pectoris   Chronic pain   Primary malignant neoplasm of lung with metastasis to brain ([place])   Acute on chronic respiratory failure with hypoxemia (HCC)   Hypokalemia   Elevated LFTs   Pneumonia   Prolonged QT interval   Pancytopenia (HCC)   Type 2 diabetes mellitus with hyperglycemia (HCC)   Malignant neoplasm of mediastinum, part unspecified (HCC)   Pleural effusion   Bloodstream infection due to Port-A-Cath   History of present illness:  Kelly Olson. is a 72 y.o. male with past medical history significant for chronic hypoxic respiratory failure and COPD on 4L [place], small cell lung cancer with metastasis to the bone/brain, CAD, ischemic cardiomyopathy with recovered LVEF, chronic pain syndrome, history of tobacco abuse who presented to [facility] ED on 2022/03/23 from home via EMS with weakness, confusion, with increasing oxygen needs.  Patient was noted by home health to have oxygen saturations in the 60s on his 4 L nasal cannula.   Recently admitted 1 month ago with acute hypoxic respiratory failure due to pneumonia and COPD exacerbation, and was discharged on 2 L nasal cannula.  He reports has been feeling more short of breath the last few days prior to  admission in which she was slowly increasing the amount of oxygen he was using.  When home health nurse arrived his oxygen saturation was noted to be in the 60s.  Patient does report nonproductive cough but denied fever, chills or chest pain.   In the ED, temperature 100.0 F, HR 101, RR 21, BP 120/67, SpO2 99% on NRB.  Sodium 142, potassium 2.3, chloride 93, CO2 38, glucose 265, BUN 24, creatinine 0.81.  Alkaline phosphatase 229, AST 90, ALT 119, total bilirubin 1.0.  BNP 697.7.  Lactic acid 1.6.  INR 1.3.  WBC 0.2, hemoglobin 5.5, platelets 6.  Blood cultures were collected and patient was started on IV vancomycin/cefepime, 1 L LR, and given 40 mill equivalents of IV potassium.  2 unit PRBC and 1 unit platelet were ordered for transfusion.  Hematology/oncology was consulted.  TRH consulted for admission for further evaluation and management of acute on chronic hypoxic respiratory failure, pancytopenia.   Hospital course:  Acute on chronic hypoxic respiratory failure Multifocal pneumonia Pleural effusion Recently discharged on 2 L nasal cannula with increasing oxygen needs.  Chest x-ray on admission with emphysema, interval worsening of left greater than right heterogeneous airspace disease consistent with edema versus pneumonia.  Strep pneumo antigen negative, urine Legionella antigen negative.  Underwent thoracentesis by PCCM on 1/30 with 800 mL fluid removed.  Patient continued with continued high oxygen needs up to 55 L high flow nasal cannula with no improvement despite aggressive measures with IV antibiotics.  Patient was seen by PCCM, infectious disease, and palliative care.  Patient slowly continued to decline and  became obtunded with increased respiratory distress.  Meeting at bedside with family to include spouse as well as critical care, Dr. Tamala Julian and was determined to transition to comfort measures given his continued rapid decline.  Patient passed on 2022/03/25 at 05-03-46.   Pancytopenia CBC  on admission with WBC 0.2, absolute neutrophil count 0.1, hemoglobin 5.5, platelets 6.  Transfused 2 unit PRBCs and 3 unit platelets during hospitalization.  Oncology was consulted and recommended G-CSF.  Etiology likely secondary to ongoing chemotherapy, no active bleeding.  Patient's blood counts slowly improved but given his decline as above he was transition to comfort measures with his ultimate demise.   E. coli bacteremia Blood cultures 3 out of 4 on 1/26 positive for E. coli with resistance to Bactrim, ampicillin/sulbactam.  Infectious disease was consulted and followed during hospital course.  Patient was continued on cefazolin and metronidazole per their recommendations.  Repeat blood cultures are performed did not show any growth at time of his death.   Hypokalemia Repleted during hospitalization.   Elevated LFTs Stable, likely related to liver metastatic disease.  Patient was home statin was held during the hospital course.   Dysphagia Likely secondary to his critical illness, patient was evaluated by speech therapy who recommended nothing by mouth given his high O2 needs and high aspiration risk.   Small cell lung cancer with metastasis to bone/brain Follows with medical oncology outpatient, Dr. Julien Nordmann.  Currently undergoing treatment with carboplatin, etoposide, Cosela and Imfinzi.  Patient was seen by medical oncology while inpatient.  Overall prognosis was poor and palliative care was consulted and followed during hospital course.  High contributing factor to his ultimate demise as above. -- Palliative care following, appreciate assistance, now DNR, overall prognosis poor   Chronic diastolic congestive heart failure, compensated Ischemic cardiomyopathy with recovered LVEF CAD Essential hypertension Prolonged Qtc Protein calorie malnutrition    Procedures: Thoracentesis 02/28/2022  Consultations: PCCM, palliative care, medical oncology, infectious disease  The results of  significant diagnostics from this hospitalization (including imaging, microbiology, ancillary and laboratory) are listed below for reference.   Significant Diagnostic Studies: DG CHEST PORT 1 VIEW  Result Date: Mar 25, 2022 CLINICAL DATA:  Status post left thoracentesis EXAM: PORTABLE CHEST 1 VIEW COMPARISON:  Chest radiograph dated 02/28/2022 FINDINGS: Lines/tubes: Right chest wall port tip projects over the superior cavoatrial junction. Chest: Similar bibasilar patchy opacities, left-greater-than-right. Unchanged thin curvilinear lucency projecting over the right upper quadrant. Pleura: Persistent small bilateral pleural effusions. No pneumothorax. Heart/mediastinum: Similar  cardiomediastinal silhouette. Bones: Median sternotomy wires are nondisplaced. IMPRESSION: 1. Persistent small bilateral pleural effusions. No pneumothorax. 2. Similar bibasilar patchy opacities, left-greater-than-right. 3. Unchanged thin curvilinear lucency projecting over the right upper quadrant is favored to reflect skin folds. Consider upright or left lateral decubitus radiograph of the abdomen if there is clinical concern for pneumoperitoneum. Electronically Signed   By: Darrin Nipper M.D.   On: 03/25/22 09:49   DG Chest Port 1 View  Result Date: 02/28/2022 CLINICAL DATA:  Status post thoracentesis EXAM: PORTABLE CHEST 1 VIEW COMPARISON:  Chest x-ray 02/27/2022 FINDINGS: Interval reduction in size of the left pleural fluid collection. No postprocedural pneumothorax is identified. There is a persistent small right pleural effusion. Persistent interstitial and airspace process in the lungs but improved aeration when compared to yesterday's film which would suggest resolving pulmonary edema. IMPRESSION: 1. Interval reduction in size of the left pleural fluid collection. No postprocedural pneumothorax. 2. Persistent small right pleural effusion. 3. Improving pulmonary edema. Electronically Signed  By: Marijo Sanes M.D.   On:  02/28/2022 10:07   DG Chest Port 1 View  Result Date: 02/27/2022 CLINICAL DATA:  Hypoxia.  Pneumonia. EXAM: PORTABLE CHEST 1 VIEW COMPARISON:  02/26/2022 FINDINGS: Bibasilar interstitial and patchy airspace disease is similar to prior with persistent bilateral pleural effusions. Cardiopericardial silhouette is at upper limits of normal for size. Right Port-A-Cath again noted. Bones are diffusely demineralized. IMPRESSION: No substantial interval change. Persistent bibasilar interstitial and patchy airspace disease with bilateral pleural effusions. Electronically Signed   By: Misty Stanley M.D.   On: 02/27/2022 10:32   DG CHEST PORT 1 VIEW  Result Date: 02/26/2022 CLINICAL DATA:  Acute respiratory failure and hypoxia EXAM: PORTABLE CHEST 1 VIEW COMPARISON:  02/25/2022 FINDINGS: Cardiac shadow is stable. Right chest wall port is again seen and stable. Increasing bilateral airspace opacities are noted particularly in the right apex. Additionally and increasing left-sided effusion is noted. No bony abnormality is noted. IMPRESSION: Increasing infiltrates particularly in the right apex. Increasing left basilar effusion. Electronically Signed   By: Inez Catalina M.D.   On: 02/26/2022 17:40   DG CHEST PORT 1 VIEW  Result Date: 02/25/2022 CLINICAL DATA:  Dyspnea. EXAM: PORTABLE CHEST 1 VIEW COMPARISON:  February 24, 2022. FINDINGS: Stable cardiomediastinal silhouette. Status post coronary bypass graft. Right internal jugular Port-A-Cath is unchanged. Stable bilateral lung opacities are noted concerning for pneumonia or edema. Bony thorax is unremarkable. Small bilateral pleural effusions may be present. IMPRESSION: Stable bilateral lung opacities and probable associated pleural effusions. Electronically Signed   By: Marijo Conception M.D.   On: 02/25/2022 18:41   DG Chest Port 1 View  Result Date: 02/12/2022 CLINICAL DATA:  Possible sepsis EXAM: PORTABLE CHEST 1 VIEW COMPARISON:  01/26/2022, CT 02/07/2022, PET  CT 01/12/2022 FINDINGS: Right-sided central venous port tip at the cavoatrial region. Post sternotomy changes. Worsened bilateral left greater than right heterogeneous airspace disease. Underlying emphysema and chronic interstitial opacity. Normal cardiac size. Aortic atherosclerosis. No pneumothorax. IMPRESSION: Emphysema. Interval worsening of left greater than right heterogeneous airspace disease which may be due to edema, pneumonia, or combination of the 2, on the left this superimposes CT demonstrated nodularity at the left base, see chest CT report 02/07/2022. Electronically Signed   By: Donavan Foil M.D.   On: 02/22/2022 19:19   CT Chest W Contrast  Result Date: 02/07/2022 CLINICAL DATA:  Small-cell lung cancer staging. * Tracking Code: BO * EXAM: CT CHEST WITH CONTRAST TECHNIQUE: Multidetector CT imaging of the chest was performed during intravenous contrast administration. RADIATION DOSE REDUCTION: This exam was performed according to the departmental dose-optimization program which includes automated exposure control, adjustment of the mA and/or kV according to patient size and/or use of iterative reconstruction technique. CONTRAST:  35mL OMNIPAQUE IOHEXOL 300 MG/ML  SOLN COMPARISON:  X-ray 01/18/2022. PET-CT 01/12/2022. CT 11/04/2021. Older exams as well. FINDINGS: Cardiovascular: Right upper chest port identified. Slightly atrophic appearing internal jugular vein. Status post median sternotomy. The heart is nonenlarged. No significant pericardial effusion. The thoracic aorta has a normal course and caliber with scattered atherosclerotic plaque. Minimal focal ectasia along the aortic arch. Bovine type aortic arch identified. Coronary artery calcifications are noted. Mediastinum/Nodes: There is no specific abnormal lymph node enlargement seen in the axillary region, hilum or mediastinum. Normal caliber thoracic esophagus. Slightly heterogeneous thyroid gland. Lungs/Pleura: Advanced emphysematous lung  changes identified, centrilobular diffusely seen. There is a tiny left pleural effusion greater than right. Areas of interstitial septal thickening identified and scarring/fibrosis.  Patient is status post right-sided lobectomy lower lobe. There appears to be some asymmetric parenchymal opacity along the left lower lobe and dependent lingula. The lingular areas are increased from previous. Lower lobe is similar. Previously there were some small lung nodules. This includes a left lower lobe nodule measuring 8 x 9 mm on the prior and today this same nodule on image 108 of series 7 measures 10 x 12 mm. The more dependent areas in the left lower lobe towards the diaphragm are poorly seen with the increasing opacity which has been developing since the prior standard CT scan of October 2023. One areas somewhat nodular in this location as measured on the prior PET-CT scan. On the PET-CT this was measured at 15 mm and today measured in similar fashion on series 7, image 114 measures 17 mm. Upper Abdomen: Along the upper abdomen the adrenal glands are diffusely thickened bilaterally. Please correlate for history. Hypertrophy is possible versus a lesion. This is significantly increased from previous. Fatty liver infiltration identified. Musculoskeletal: Mild degenerative changes seen along the spine. If there is concern of osseous metastatic disease, bone scan can be performed as clinically directed. Anasarca. IMPRESSION: Left lower lobe areas of nodularity appear to be slightly increasing from previous examinations. Some of these areas are partially obscured by the increasing parenchymal opacities in the lingula and left lower lobe. Extensive emphysematous lung changes as well as areas of scarring and fibrotic change. Tiny bilateral pleural effusions are identified. Left is similar to previous. The right is increasing. Chest port. No developing new lymph node enlargement. In the upper abdomen the adrenal glands today are  diffusely thickened compared to the previous examinations and this is a new finding from October 2023 and December 2023. Please correlate with any known history or clinical evidence of adrenal abnormality. Otherwise additional workup with MRI with and without contrast may be of benefit as the next step in the workup. Aortic Atherosclerosis (ICD10-I70.0) and Emphysema (ICD10-J43.9). Electronically Signed   By: Jill Side M.D.   On: 02/07/2022 18:50    Microbiology: Recent Results (from the past 240 hour(s))  Blood Culture (routine x 2)     Status: Abnormal   Collection Time: 01/31/2022  7:15 PM   Specimen: BLOOD  Result Value Ref Range Status   Specimen Description BLOOD SITE NOT SPECIFIED  Final   Special Requests   Final    BOTTLES DRAWN AEROBIC AND ANAEROBIC Blood Culture results may not be optimal due to an excessive volume of blood received in culture bottles   Culture  Setup Time   Final    GRAM NEGATIVE RODS IN BOTH AEROBIC AND ANAEROBIC BOTTLES CRITICAL RESULT CALLED TO, READ BACK BY AND VERIFIED WITH:  C/ PHARMD J. MILLEN 02/25/22 1324 A. LAFRANCE Performed at [facility], [place] [address], [place], [place] [address]    Culture ESCHERICHIA COLI (A)  Final   Report Status 02/27/2022 FINAL  Final   Organism ID, Bacteria ESCHERICHIA COLI  Final      Susceptibility   Escherichia coli - MIC*    AMPICILLIN >=32 RESISTANT Resistant     CEFAZOLIN <=4 SENSITIVE Sensitive     CEFEPIME <=0.12 SENSITIVE Sensitive     CEFTAZIDIME <=1 SENSITIVE Sensitive     CEFTRIAXONE <=0.25 SENSITIVE Sensitive     CIPROFLOXACIN <=0.25 SENSITIVE Sensitive     GENTAMICIN <=1 SENSITIVE Sensitive     IMIPENEM <=0.25 SENSITIVE Sensitive     TRIMETH/SULFA >=320 RESISTANT Resistant  AMPICILLIN/SULBACTAM >=32 RESISTANT Resistant     PIP/TAZO <=4 SENSITIVE Sensitive     * ESCHERICHIA COLI  Blood Culture ID Panel (Reflexed)     Status: Abnormal   Collection Time: 02/08/2022  7:15 PM  Result Value Ref  Range Status   Enterococcus faecalis NOT DETECTED NOT DETECTED Final   Enterococcus Faecium NOT DETECTED NOT DETECTED Final   Listeria monocytogenes NOT DETECTED NOT DETECTED Final   Staphylococcus species NOT DETECTED NOT DETECTED Final   Staphylococcus aureus (BCID) NOT DETECTED NOT DETECTED Final   Staphylococcus epidermidis NOT DETECTED NOT DETECTED Final   Staphylococcus lugdunensis NOT DETECTED NOT DETECTED Final   Streptococcus species NOT DETECTED NOT DETECTED Final   Streptococcus agalactiae NOT DETECTED NOT DETECTED Final   Streptococcus pneumoniae NOT DETECTED NOT DETECTED Final   Streptococcus pyogenes NOT DETECTED NOT DETECTED Final   A.calcoaceticus-baumannii NOT DETECTED NOT DETECTED Final   Bacteroides fragilis NOT DETECTED NOT DETECTED Final   Enterobacterales DETECTED (A) NOT DETECTED Final    Comment: Enterobacterales represent a large order of gram negative bacteria, not a single organism. CRITICAL RESULT CALLED TO, READ BACK BY AND VERIFIED WITH:  C/ PHARMD J. MILLEN 02/25/22 1324 A. LAFRANCE    Enterobacter cloacae complex NOT DETECTED NOT DETECTED Final   Escherichia coli DETECTED (A) NOT DETECTED Final    Comment: CRITICAL RESULT CALLED TO, READ BACK BY AND VERIFIED WITH:  C/ PHARMD J. MILLEN 02/25/22 1324 A. LAFRANCE    Klebsiella aerogenes NOT DETECTED NOT DETECTED Final   Klebsiella oxytoca NOT DETECTED NOT DETECTED Final   Klebsiella pneumoniae NOT DETECTED NOT DETECTED Final   Proteus species NOT DETECTED NOT DETECTED Final   Salmonella species NOT DETECTED NOT DETECTED Final   Serratia marcescens NOT DETECTED NOT DETECTED Final   Haemophilus influenzae NOT DETECTED NOT DETECTED Final   Neisseria meningitidis NOT DETECTED NOT DETECTED Final   Pseudomonas aeruginosa NOT DETECTED NOT DETECTED Final   Stenotrophomonas maltophilia NOT DETECTED NOT DETECTED Final   Candida albicans NOT DETECTED NOT DETECTED Final   Candida auris NOT DETECTED NOT DETECTED  Final   Candida glabrata NOT DETECTED NOT DETECTED Final   Candida krusei NOT DETECTED NOT DETECTED Final   Candida parapsilosis NOT DETECTED NOT DETECTED Final   Candida tropicalis NOT DETECTED NOT DETECTED Final   Cryptococcus neoformans/gattii NOT DETECTED NOT DETECTED Final   CTX-M ESBL NOT DETECTED NOT DETECTED Final   Carbapenem resistance IMP NOT DETECTED NOT DETECTED Final   Carbapenem resistance KPC NOT DETECTED NOT DETECTED Final   Carbapenem resistance NDM NOT DETECTED NOT DETECTED Final   Carbapenem resist OXA 48 LIKE NOT DETECTED NOT DETECTED Final   Carbapenem resistance VIM NOT DETECTED NOT DETECTED Final    Comment: Performed at [facility], [address], [place], [place] [address]  Blood Culture (routine x 2)     Status: Abnormal   Collection Time: 01/30/2022  7:30 PM   Specimen: BLOOD  Result Value Ref Range Status   Specimen Description BLOOD SITE NOT SPECIFIED  Final   Special Requests   Final    BOTTLES DRAWN AEROBIC AND ANAEROBIC Blood Culture results may not be optimal due to an excessive volume of blood received in culture bottles   Culture  Setup Time   Final    ANAEROBIC BOTTLE ONLY GRAM NEGATIVE RODS CRITICAL VALUE NOTED.  VALUE IS CONSISTENT WITH PREVIOUSLY REPORTED AND CALLED VALUE.    Culture (A)  Final    ESCHERICHIA COLI SUSCEPTIBILITIES  PERFORMED ON PREVIOUS CULTURE WITHIN THE LAST 5 DAYS. Performed at [facility], [place] [address] , [place] [address]    Report Status 02/27/2022 FINAL  Final  Resp panel by RT-PCR (RSV, Flu A&B, Covid) Anterior Nasal Swab     Status: None   Collection Time: 02/25/22  8:59 AM   Specimen: Anterior Nasal Swab  Result Value Ref Range Status   SARS Coronavirus 2 by RT PCR NEGATIVE NEGATIVE Final    Comment: (NOTE) SARS-CoV-2 target nucleic acids are NOT DETECTED.  The SARS-CoV-2 RNA is generally detectable in upper respiratory specimens during the acute phase of infection. The  lowest concentration of SARS-CoV-2 viral copies this assay can detect is 138 copies/mL. A negative result does not preclude SARS-Cov-2 infection and should not be used as the sole basis for treatment or other patient management decisions. A negative result may occur with  improper specimen collection/handling, submission of specimen other than nasopharyngeal swab, presence of viral mutation(s) within the areas targeted by this assay, and inadequate number of viral copies(<138 copies/mL). A negative result must be combined with clinical observations, patient history, and epidemiological information. The expected result is Negative.  Fact Sheet for Patients:  EntrepreneurPulse.com.au  Fact Sheet for Healthcare Providers:  IncredibleEmployment.be  This test is no t yet approved or cleared by the [place] FDA and  has been authorized for detection and/or diagnosis of SARS-CoV-2 by FDA under an Emergency Use Authorization (EUA). This EUA will remain  in effect (meaning this test can be used) for the duration of the COVID-19 declaration under Section 564(b)(1) of the Act, 21 U.S.C.section 360bbb-3(b)(1), unless the authorization is terminated  or revoked sooner.       Influenza A by PCR NEGATIVE NEGATIVE Final   Influenza B by PCR NEGATIVE NEGATIVE Final    Comment: (NOTE) The Xpert Xpress SARS-CoV-2/FLU/RSV plus assay is intended as an aid in the diagnosis of influenza from Nasopharyngeal swab specimens and should not be used as a sole basis for treatment. Nasal washings and aspirates are unacceptable for Xpert Xpress SARS-CoV-2/FLU/RSV testing.  Fact Sheet for Patients: EntrepreneurPulse.com.au  Fact Sheet for Healthcare Providers: IncredibleEmployment.be  This test is not yet approved or cleared by the [place] FDA and has been authorized for detection and/or diagnosis of SARS-CoV-2 by FDA under  an Emergency Use Authorization (EUA). This EUA will remain in effect (meaning this test can be used) for the duration of the COVID-19 declaration under Section 564(b)(1) of the Act, 21 U.S.C. section 360bbb-3(b)(1), unless the authorization is terminated or revoked.     Resp Syncytial Virus by PCR NEGATIVE NEGATIVE Final    Comment: (NOTE) Fact Sheet for Patients: EntrepreneurPulse.com.au  Fact Sheet for Healthcare Providers: IncredibleEmployment.be  This test is not yet approved or cleared by the [place] FDA and has been authorized for detection and/or diagnosis of SARS-CoV-2 by FDA under an Emergency Use Authorization (EUA). This EUA will remain in effect (meaning this test can be used) for the duration of the COVID-19 declaration under Section 564(b)(1) of the Act, 21 U.S.C. section 360bbb-3(b)(1), unless the authorization is terminated or revoked.  Performed at [facility], [place] [address], [place], [place] [address]   MRSA Next Gen by PCR, Nasal     Status: None   Collection Time: 02/25/22  8:59 AM  Result Value Ref Range Status   MRSA by PCR Next Gen NOT DETECTED NOT DETECTED Final    Comment: (NOTE) The GeneXpert MRSA Assay (  FDA approved for NASAL specimens only), is one component of a comprehensive MRSA colonization surveillance program. It is not intended to diagnose MRSA infection nor to guide or monitor treatment for MRSA infections. Test performance is not FDA approved in patients less than 53 years old. Performed at [facility], [place] [address], [place], [place] [address]   Culture, blood (Routine X 2) w Reflex to ID Panel     Status: None (Preliminary result)   Collection Time: 02/27/22  6:04 PM   Specimen: BLOOD  Result Value Ref Range Status   Specimen Description BLOOD LEFT ANTECUBITAL  Final   Special Requests   Final    BOTTLES DRAWN AEROBIC AND ANAEROBIC Blood Culture adequate volume   Culture    Final    NO GROWTH 3 DAYS Performed at [facility], [place] [address], [place], [place] [address]    Report Status PENDING  Incomplete  Culture, blood (Routine X 2) w Reflex to ID Panel     Status: None (Preliminary result)   Collection Time: 02/27/22  6:04 PM   Specimen: BLOOD LEFT HAND  Result Value Ref Range Status   Specimen Description BLOOD LEFT HAND  Final   Special Requests   Final    BOTTLES DRAWN AEROBIC AND ANAEROBIC Blood Culture adequate volume   Culture   Final    NO GROWTH 3 DAYS Performed at [facility], [place] [address] [place], [place] [address]    Report Status PENDING  Incomplete  Body fluid culture w Gram Stain     Status: None (Preliminary result)   Collection Time: 02/28/22  9:45 AM   Specimen: Pleural Fluid  Result Value Ref Range Status   Specimen Description PLEURAL  Final   Special Requests NONE  Final   Gram Stain   Final    FEW WBC PRESENT,BOTH PMN AND MONONUCLEAR NO ORGANISMS SEEN    Culture   Final    NO GROWTH < 24 HOURS Performed at [facility], [place] [address], [place],  [address]    Report Status PENDING  Incomplete    Time spent: 35 minutes  Signed: Saarah Dewing J [place] DO 03-22-22

## 2022-03-31 DEATH — deceased

## 2022-04-04 ENCOUNTER — Other Ambulatory Visit: Payer: Medicare Other

## 2022-04-11 ENCOUNTER — Other Ambulatory Visit: Payer: Medicare Other

## 2022-04-19 ENCOUNTER — Ambulatory Visit: Payer: Medicare Other | Admitting: Internal Medicine

## 2022-04-19 ENCOUNTER — Other Ambulatory Visit: Payer: Medicare Other

## 2022-04-19 ENCOUNTER — Ambulatory Visit: Payer: Medicare Other

## 2022-05-04 ENCOUNTER — Other Ambulatory Visit: Payer: Medicare Other

## 2022-05-10 ENCOUNTER — Ambulatory Visit: Payer: Self-pay | Admitting: Urology

## 2022-05-22 ENCOUNTER — Ambulatory Visit: Payer: Medicare Other | Admitting: Cardiology

## 2023-04-27 IMAGING — MR MR HEAD WO/W CM
10 of 15 series · 36 of 48 positions shown · IV contrast (gadavist)
Comparison: MRI of the brain April 07, 2012.

CLINICAL DATA: Malignant neoplasm of unspecified part of
unspecified bronchus or lung (HCC) 4MD.7H ([id number]). Small cell
lung cancer (SCLC), staging.

EXAM:
MRI HEAD WITHOUT AND WITH CONTRAST
TECHNIQUE: Multiplanar, multiecho pulse sequences of the brain and surrounding
structures were obtained without and with intravenous contrast.
CONTRAST:  7mL GADAVIST GADOBUTROL 1 MMOL/ML IV SOLN

[Series 5: DWI · axial · 3.0mm · 1.36mm/px · z∈[-35,+114]mm · 7 of 104 slices shown (1 of 2)]
[im 1/104]
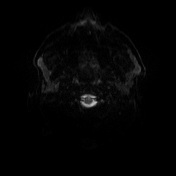
[im 18/104]
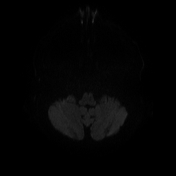
[im 35/104]
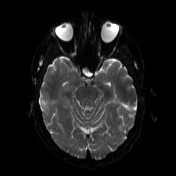
[im 52/104]
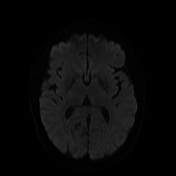
[im 69/104]
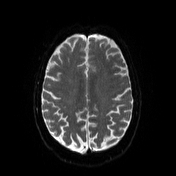
[im 86/104]
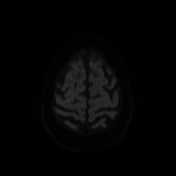
[im 104/104]
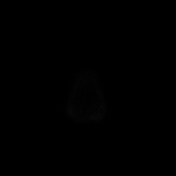

[Series 6: DWI · axial · 3.0mm · 1.36mm/px · z∈[-35,+114]mm · 3 of 52 slices shown (2 of 2)]
[im 1/52]
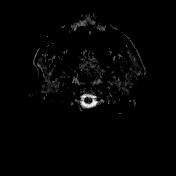
[im 26/52]
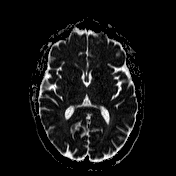
[im 52/52]
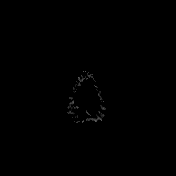

[Series 7: T1 · sagittal · 5.0mm · 0.75mm/px · 1 of 24 slices shown (1 of 2)]
[im 1/24]
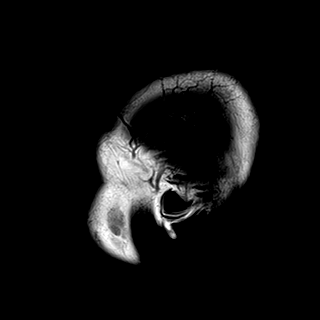

[Series 8: T2 · axial · 5.0mm · 0.62mm/px · 1 of 26 slices shown]
[im 1/26]
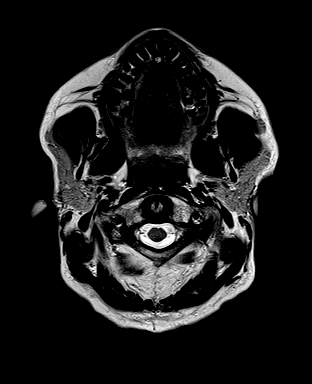

[Series 11: FLAIR · axial · 3.0mm · 0.75mm/px · z∈[-38,+111]mm · 3 of 52 slices shown]
[im 1/52]
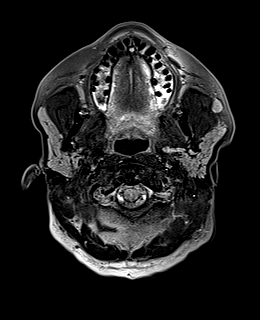
[im 26/52]
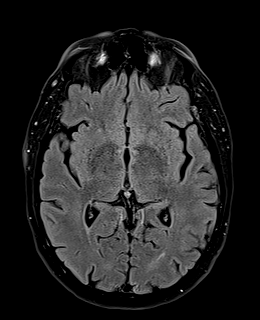
[im 52/52]
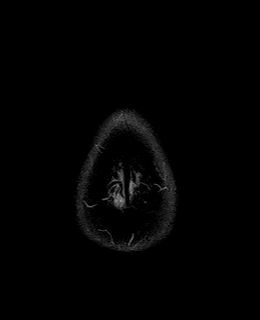

[Series 12: T1 · axial · 1.0mm · 0.47mm/px · z∈[-28,+112]mm · 8 of 144 slices shown (2 of 2)]
[im 1/144]
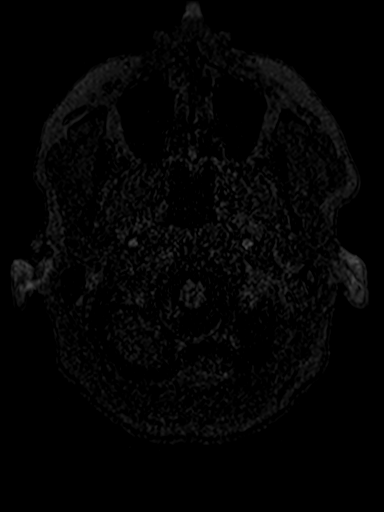
[im 21/144]
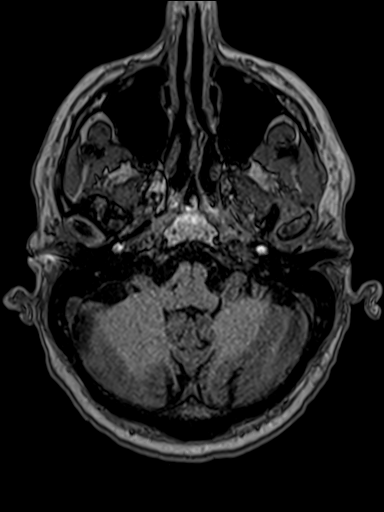
[im 41/144]
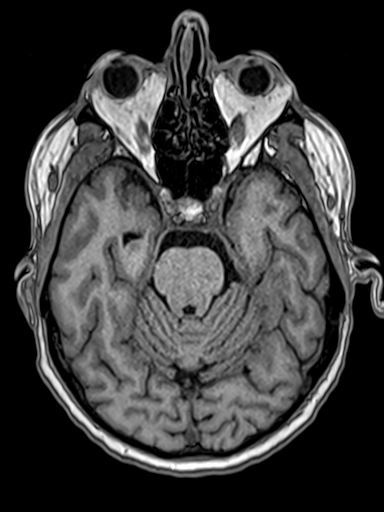
[im 62/144]
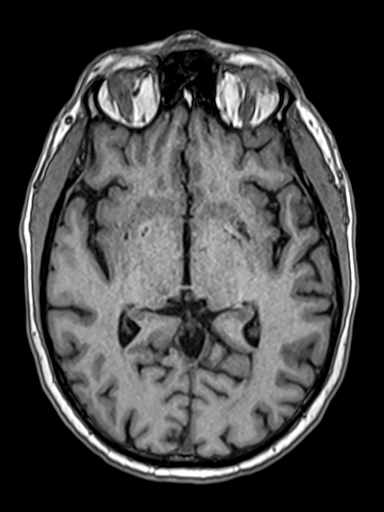
[im 82/144]
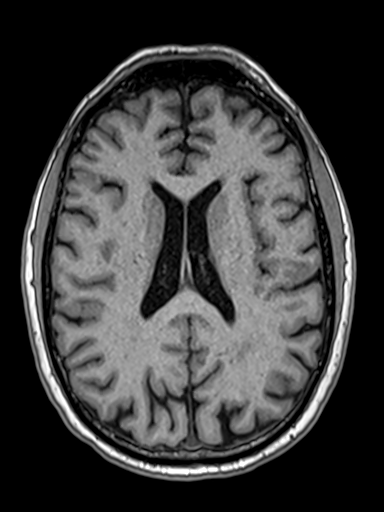
[im 103/144]
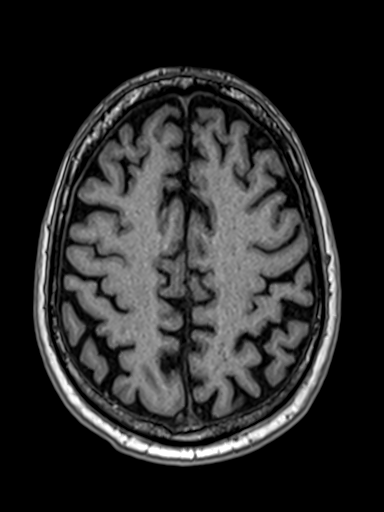
[im 123/144]
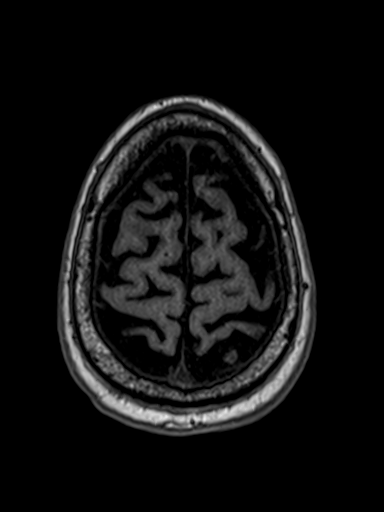
[im 144/144]
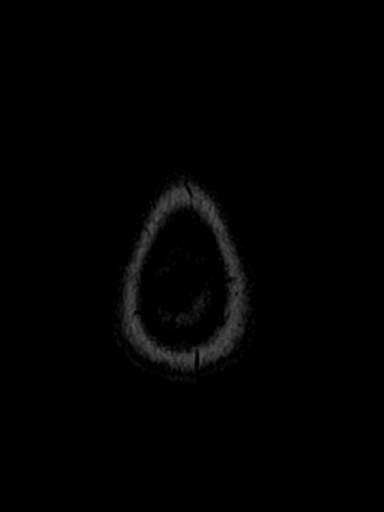

[Series 15: T2 post-contrast · coronal · 5.0mm · 0.57mm/px · 2 of 30 slices shown]
[im 1/30]
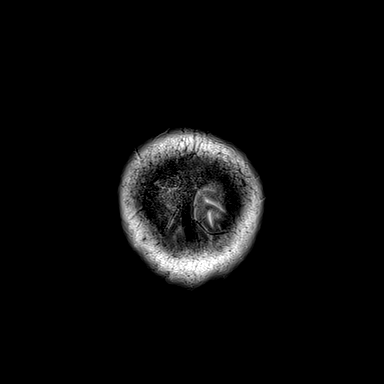
[im 30/30]
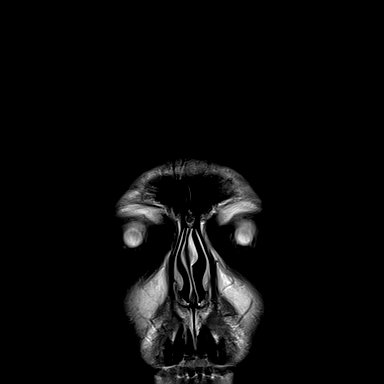

[Series 16: T1 post-contrast · axial · 1.0mm · 0.47mm/px · z∈[-28,+112]mm · 8 of 144 slices shown (1 of 3)]
[im 1/144]
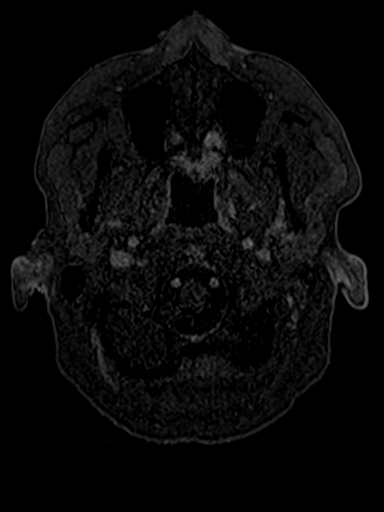
[im 21/144]
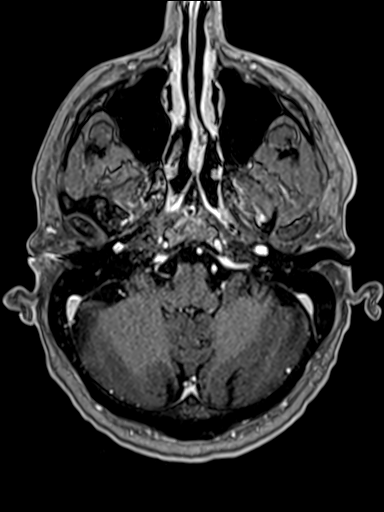
[im 41/144]
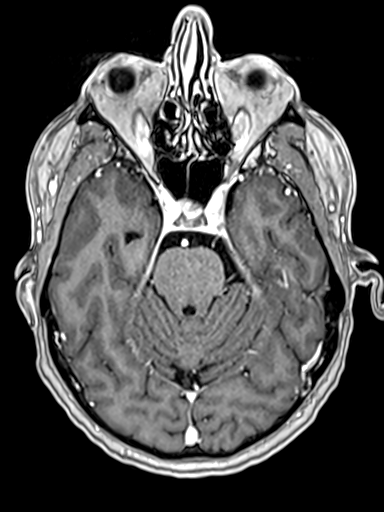
[im 62/144]
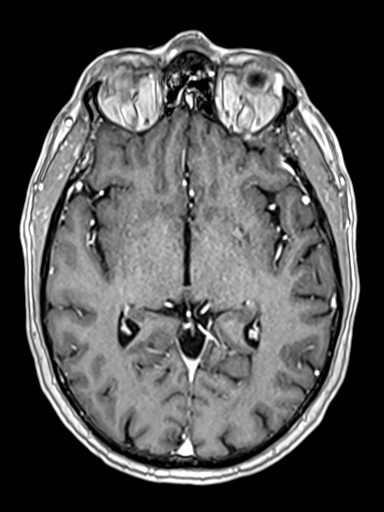
[im 82/144]
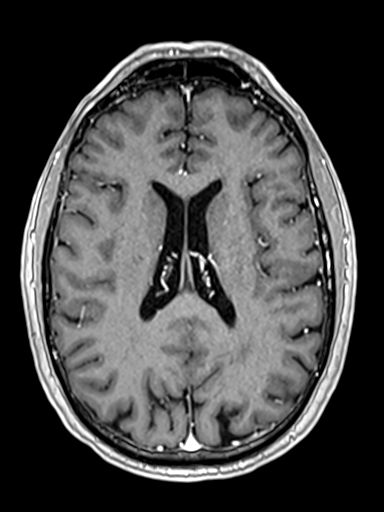
[im 103/144]
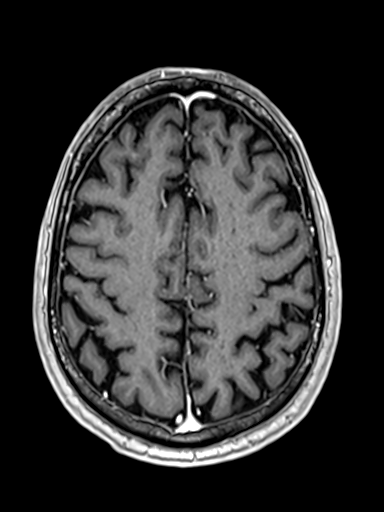
[im 123/144]
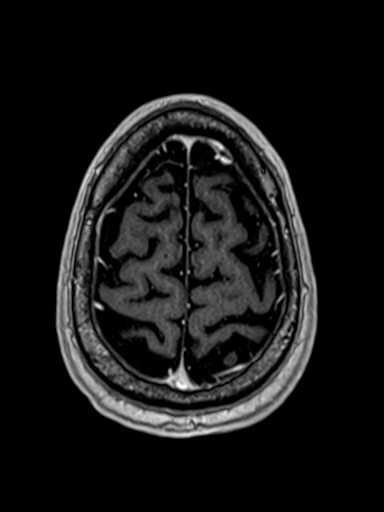
[im 144/144]
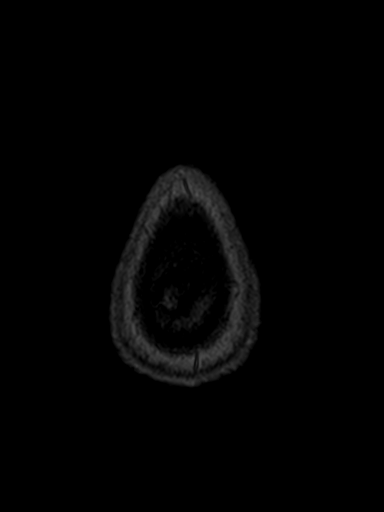

[Series 19: T1 post-contrast · coronal · 5.0mm · 0.43mm/px · 2 of 30 slices shown (2 of 3)]
[im 1/30]
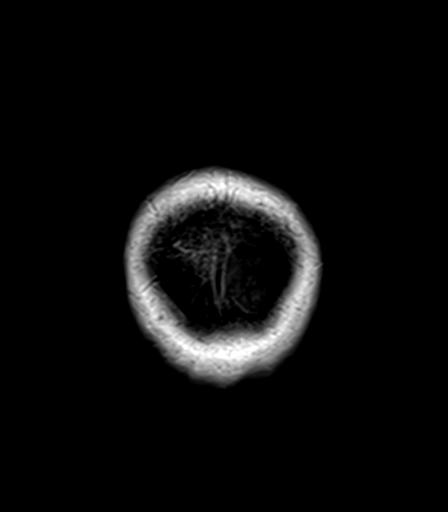
[im 30/30]
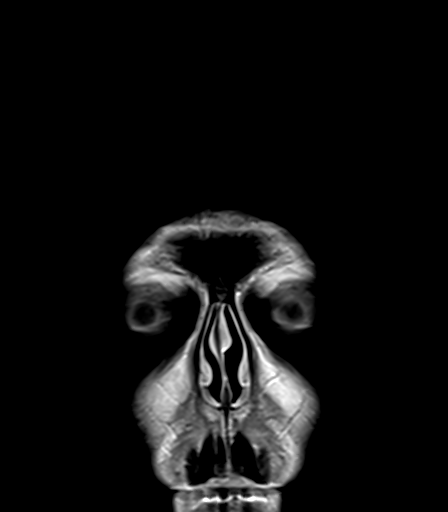

[Series 20: T1 post-contrast · sagittal · 5.0mm · 0.75mm/px · 1 of 24 slices shown (3 of 3)]
[im 1/24]
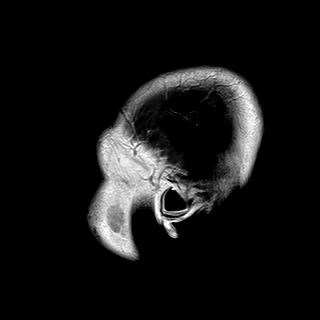

[36 of 48 positions shown; findings below may reference images not displayed]

FINDINGS: Brain: No acute infarction, hemorrhage, hydrocephalus, extra-axial
collection or mass lesion. Scattered foci of T2 hyperintensity are
seen within the white matter of the cerebral hemispheres and within
the pons, nonspecific, most likely related to chronic small vessel
ischemia. No focus of abnormal contrast enhancement identified.

Vascular: Normal flow voids.

Skull and upper cervical spine: Normal marrow signal.

Sinuses/Orbits: Bilateral exophthalmos. Paranasal sinuses are clear.
Trace bilateral mastoid effusion.

Other: None.
IMPRESSION: 1. No evidence of intracranial metastatic disease.
2. Mild chronic microvascular ischemic changes of the white matter.
